# Patient Record
Sex: Female | Born: 1970
Health system: Southern US, Community
[De-identification: ages and names within clinical notes are randomized; demographics above are authoritative.]

## PROBLEM LIST (undated history)

## (undated) DIAGNOSIS — S32402A Unspecified fracture of left acetabulum, initial encounter for closed fracture: Secondary | ICD-10-CM

## (undated) DIAGNOSIS — E785 Hyperlipidemia, unspecified: Secondary | ICD-10-CM

## (undated) DIAGNOSIS — S39012A Strain of muscle, fascia and tendon of lower back, initial encounter: Secondary | ICD-10-CM

## (undated) DIAGNOSIS — D509 Iron deficiency anemia, unspecified: Secondary | ICD-10-CM

## (undated) DIAGNOSIS — D219 Benign neoplasm of connective and other soft tissue, unspecified: Secondary | ICD-10-CM

## (undated) DIAGNOSIS — E669 Obesity, unspecified: Secondary | ICD-10-CM

## (undated) DIAGNOSIS — I10 Essential (primary) hypertension: Secondary | ICD-10-CM

## (undated) DIAGNOSIS — N83209 Unspecified ovarian cyst, unspecified side: Secondary | ICD-10-CM

## (undated) HISTORY — PX: ORIF ACETABULAR FRACTURE: SHX5029

## (undated) HISTORY — DX: Hyperlipidemia, unspecified: E78.5

## (undated) HISTORY — DX: Benign neoplasm of connective and other soft tissue, unspecified: D21.9

## (undated) HISTORY — DX: Unspecified ovarian cyst, unspecified side: N83.209

## (undated) HISTORY — PX: FOOT ARTHRODESIS, MODIFIED MCBRIDE: SUR52

## (undated) HISTORY — DX: Essential (primary) hypertension: I10

## (undated) NOTE — *Deleted (*Deleted)
03/27/20 -  Sugar attack - didn't pass out   Siblings:  2000 sister died from colon cancer  Moved to [place] from [place] in 2010 Diabetes: 1800 sugar - Diagnosed 2010

---

## 1998-07-08 ENCOUNTER — Emergency Department (HOSPITAL_COMMUNITY): Admission: EM | Admit: 1998-07-08 | Discharge: 1998-07-08 | Payer: Self-pay | Admitting: Emergency Medicine

## 1998-07-09 ENCOUNTER — Inpatient Hospital Stay (HOSPITAL_COMMUNITY): Admission: EM | Admit: 1998-07-09 | Discharge: 1998-07-11 | Payer: Self-pay

## 1999-02-08 ENCOUNTER — Encounter: Admission: RE | Admit: 1999-02-08 | Discharge: 1999-02-08 | Payer: Self-pay | Admitting: Internal Medicine

## 1999-10-01 ENCOUNTER — Emergency Department (HOSPITAL_COMMUNITY): Admission: EM | Admit: 1999-10-01 | Discharge: 1999-10-01 | Payer: Self-pay | Admitting: *Deleted

## 2000-02-16 ENCOUNTER — Emergency Department (HOSPITAL_COMMUNITY): Admission: EM | Admit: 2000-02-16 | Discharge: 2000-02-16 | Payer: Self-pay | Admitting: Emergency Medicine

## 2002-06-18 ENCOUNTER — Encounter: Payer: Self-pay | Admitting: Emergency Medicine

## 2002-06-18 ENCOUNTER — Emergency Department (HOSPITAL_COMMUNITY): Admission: EM | Admit: 2002-06-18 | Discharge: 2002-06-18 | Payer: Self-pay | Admitting: Emergency Medicine

## 2002-06-21 ENCOUNTER — Encounter: Payer: Self-pay | Admitting: Emergency Medicine

## 2002-06-21 ENCOUNTER — Ambulatory Visit (HOSPITAL_COMMUNITY): Admission: RE | Admit: 2002-06-21 | Discharge: 2002-06-21 | Payer: Self-pay | Admitting: Emergency Medicine

## 2002-08-04 ENCOUNTER — Emergency Department (HOSPITAL_COMMUNITY): Admission: EM | Admit: 2002-08-04 | Discharge: 2002-08-04 | Payer: Self-pay | Admitting: Emergency Medicine

## 2002-10-27 ENCOUNTER — Emergency Department (HOSPITAL_COMMUNITY): Admission: EM | Admit: 2002-10-27 | Discharge: 2002-10-28 | Payer: Self-pay | Admitting: Emergency Medicine

## 2002-10-27 ENCOUNTER — Encounter: Payer: Self-pay | Admitting: Emergency Medicine

## 2002-12-03 ENCOUNTER — Encounter: Payer: Self-pay | Admitting: *Deleted

## 2002-12-03 ENCOUNTER — Ambulatory Visit (HOSPITAL_COMMUNITY): Admission: RE | Admit: 2002-12-03 | Discharge: 2002-12-03 | Payer: Self-pay | Admitting: *Deleted

## 2003-02-28 ENCOUNTER — Emergency Department (HOSPITAL_COMMUNITY): Admission: EM | Admit: 2003-02-28 | Discharge: 2003-02-28 | Payer: Self-pay | Admitting: Emergency Medicine

## 2003-03-28 ENCOUNTER — Emergency Department (HOSPITAL_COMMUNITY): Admission: EM | Admit: 2003-03-28 | Discharge: 2003-03-28 | Payer: Self-pay | Admitting: Emergency Medicine

## 2003-03-28 ENCOUNTER — Encounter: Payer: Self-pay | Admitting: Emergency Medicine

## 2003-06-10 ENCOUNTER — Emergency Department (HOSPITAL_COMMUNITY): Admission: EM | Admit: 2003-06-10 | Discharge: 2003-06-10 | Payer: Self-pay | Admitting: Emergency Medicine

## 2003-06-10 ENCOUNTER — Encounter: Payer: Self-pay | Admitting: Emergency Medicine

## 2003-09-18 ENCOUNTER — Emergency Department (HOSPITAL_COMMUNITY): Admission: EM | Admit: 2003-09-18 | Discharge: 2003-09-19 | Payer: Self-pay | Admitting: Emergency Medicine

## 2003-10-25 ENCOUNTER — Emergency Department (HOSPITAL_COMMUNITY): Admission: EM | Admit: 2003-10-25 | Discharge: 2003-10-25 | Payer: Self-pay | Admitting: Emergency Medicine

## 2003-10-30 ENCOUNTER — Emergency Department (HOSPITAL_COMMUNITY): Admission: EM | Admit: 2003-10-30 | Discharge: 2003-10-30 | Payer: Self-pay | Admitting: Emergency Medicine

## 2003-11-01 ENCOUNTER — Emergency Department (HOSPITAL_COMMUNITY): Admission: EM | Admit: 2003-11-01 | Discharge: 2003-11-01 | Payer: Self-pay | Admitting: Emergency Medicine

## 2003-11-09 ENCOUNTER — Encounter: Admission: RE | Admit: 2003-11-09 | Discharge: 2003-11-09 | Payer: Self-pay | Admitting: Internal Medicine

## 2003-11-21 ENCOUNTER — Encounter: Admission: RE | Admit: 2003-11-21 | Discharge: 2003-11-21 | Payer: Self-pay | Admitting: Internal Medicine

## 2003-11-28 ENCOUNTER — Encounter: Admission: RE | Admit: 2003-11-28 | Discharge: 2003-11-28 | Payer: Self-pay | Admitting: Internal Medicine

## 2004-01-23 ENCOUNTER — Encounter: Admission: RE | Admit: 2004-01-23 | Discharge: 2004-01-23 | Payer: Self-pay | Admitting: Internal Medicine

## 2004-01-24 ENCOUNTER — Emergency Department (HOSPITAL_COMMUNITY): Admission: EM | Admit: 2004-01-24 | Discharge: 2004-01-24 | Payer: Self-pay | Admitting: Emergency Medicine

## 2004-01-25 ENCOUNTER — Encounter: Admission: RE | Admit: 2004-01-25 | Discharge: 2004-01-25 | Payer: Self-pay | Admitting: Internal Medicine

## 2004-01-25 ENCOUNTER — Ambulatory Visit (HOSPITAL_COMMUNITY): Admission: RE | Admit: 2004-01-25 | Discharge: 2004-01-25 | Payer: Self-pay | Admitting: Internal Medicine

## 2004-01-26 ENCOUNTER — Emergency Department (HOSPITAL_COMMUNITY): Admission: EM | Admit: 2004-01-26 | Discharge: 2004-01-26 | Payer: Self-pay | Admitting: Emergency Medicine

## 2004-01-27 ENCOUNTER — Inpatient Hospital Stay (HOSPITAL_COMMUNITY): Admission: EM | Admit: 2004-01-27 | Discharge: 2004-01-30 | Payer: Self-pay | Admitting: Emergency Medicine

## 2004-01-30 ENCOUNTER — Inpatient Hospital Stay (HOSPITAL_COMMUNITY): Admission: RE | Admit: 2004-01-30 | Discharge: 2004-02-03 | Payer: Self-pay | Admitting: Psychiatry

## 2004-02-07 ENCOUNTER — Emergency Department (HOSPITAL_COMMUNITY): Admission: EM | Admit: 2004-02-07 | Discharge: 2004-02-07 | Payer: Self-pay | Admitting: Family Medicine

## 2004-06-10 ENCOUNTER — Encounter: Admission: RE | Admit: 2004-06-10 | Discharge: 2004-06-10 | Payer: Self-pay | Admitting: Internal Medicine

## 2004-07-31 ENCOUNTER — Emergency Department (HOSPITAL_COMMUNITY): Admission: EM | Admit: 2004-07-31 | Discharge: 2004-07-31 | Payer: Self-pay | Admitting: Emergency Medicine

## 2005-03-03 ENCOUNTER — Ambulatory Visit: Payer: Self-pay | Admitting: Internal Medicine

## 2005-05-21 ENCOUNTER — Emergency Department (HOSPITAL_COMMUNITY): Admission: EM | Admit: 2005-05-21 | Discharge: 2005-05-21 | Payer: Self-pay | Admitting: Family Medicine

## 2005-08-26 ENCOUNTER — Emergency Department (HOSPITAL_COMMUNITY): Admission: EM | Admit: 2005-08-26 | Discharge: 2005-08-26 | Payer: Self-pay | Admitting: Family Medicine

## 2005-10-01 ENCOUNTER — Ambulatory Visit: Payer: Self-pay | Admitting: Internal Medicine

## 2005-10-06 ENCOUNTER — Ambulatory Visit: Payer: Self-pay | Admitting: Internal Medicine

## 2006-01-17 ENCOUNTER — Emergency Department (HOSPITAL_COMMUNITY): Admission: EM | Admit: 2006-01-17 | Discharge: 2006-01-17 | Payer: Self-pay | Admitting: Family Medicine

## 2006-01-21 ENCOUNTER — Ambulatory Visit: Payer: Self-pay | Admitting: Internal Medicine

## 2006-01-21 ENCOUNTER — Ambulatory Visit (HOSPITAL_COMMUNITY): Admission: RE | Admit: 2006-01-21 | Discharge: 2006-01-21 | Payer: Self-pay | Admitting: Internal Medicine

## 2006-02-09 ENCOUNTER — Ambulatory Visit: Payer: Self-pay | Admitting: Hospitalist

## 2006-07-22 ENCOUNTER — Emergency Department (HOSPITAL_COMMUNITY): Admission: EM | Admit: 2006-07-22 | Discharge: 2006-07-22 | Payer: Self-pay | Admitting: Family Medicine

## 2006-12-08 ENCOUNTER — Emergency Department (HOSPITAL_COMMUNITY): Admission: EM | Admit: 2006-12-08 | Discharge: 2006-12-08 | Payer: Self-pay | Admitting: Family Medicine

## 2007-09-15 ENCOUNTER — Emergency Department (HOSPITAL_COMMUNITY): Admission: EM | Admit: 2007-09-15 | Discharge: 2007-09-15 | Payer: Self-pay | Admitting: Family Medicine

## 2008-06-30 ENCOUNTER — Emergency Department (HOSPITAL_COMMUNITY): Admission: EM | Admit: 2008-06-30 | Discharge: 2008-06-30 | Payer: Self-pay | Admitting: Emergency Medicine

## 2010-05-15 ENCOUNTER — Emergency Department (HOSPITAL_COMMUNITY): Admission: EM | Admit: 2010-05-15 | Discharge: 2010-05-15 | Payer: Self-pay | Admitting: Emergency Medicine

## 2010-05-30 ENCOUNTER — Ambulatory Visit: Payer: Self-pay | Admitting: Internal Medicine

## 2010-05-30 DIAGNOSIS — E1110 Type 2 diabetes mellitus with ketoacidosis without coma: Secondary | ICD-10-CM

## 2010-05-30 DIAGNOSIS — F32A Depression, unspecified: Secondary | ICD-10-CM | POA: Insufficient documentation

## 2010-05-30 DIAGNOSIS — M5116 Intervertebral disc disorders with radiculopathy, lumbar region: Secondary | ICD-10-CM | POA: Insufficient documentation

## 2010-05-30 DIAGNOSIS — E1165 Type 2 diabetes mellitus with hyperglycemia: Secondary | ICD-10-CM

## 2010-05-30 DIAGNOSIS — E785 Hyperlipidemia, unspecified: Secondary | ICD-10-CM | POA: Insufficient documentation

## 2010-05-30 DIAGNOSIS — E119 Type 2 diabetes mellitus without complications: Secondary | ICD-10-CM | POA: Insufficient documentation

## 2010-05-30 DIAGNOSIS — F329 Major depressive disorder, single episode, unspecified: Secondary | ICD-10-CM

## 2010-05-30 DIAGNOSIS — Q6600 Congenital talipes equinovarus, unspecified foot: Secondary | ICD-10-CM | POA: Insufficient documentation

## 2010-05-30 DIAGNOSIS — IMO0002 Reserved for concepts with insufficient information to code with codable children: Secondary | ICD-10-CM | POA: Insufficient documentation

## 2010-05-30 LAB — CONVERTED CEMR LAB: Blood Glucose, Home Monitor: 3 mg/dL

## 2010-06-07 ENCOUNTER — Telehealth (INDEPENDENT_AMBULATORY_CARE_PROVIDER_SITE_OTHER): Payer: Self-pay | Admitting: *Deleted

## 2010-06-26 ENCOUNTER — Encounter: Payer: Self-pay | Admitting: Internal Medicine

## 2010-06-26 ENCOUNTER — Ambulatory Visit: Payer: Self-pay | Admitting: Internal Medicine

## 2010-06-26 LAB — CONVERTED CEMR LAB
Blood Glucose, Fingerstick: 115
Hgb A1c MFr Bld: 8.4 %

## 2010-06-27 ENCOUNTER — Telehealth: Payer: Self-pay | Admitting: Internal Medicine

## 2010-08-05 ENCOUNTER — Telehealth (INDEPENDENT_AMBULATORY_CARE_PROVIDER_SITE_OTHER): Payer: Self-pay | Admitting: *Deleted

## 2010-08-16 ENCOUNTER — Telehealth (INDEPENDENT_AMBULATORY_CARE_PROVIDER_SITE_OTHER): Payer: Self-pay | Admitting: *Deleted

## 2010-09-17 ENCOUNTER — Telehealth: Payer: Self-pay | Admitting: *Deleted

## 2010-09-19 ENCOUNTER — Ambulatory Visit: Payer: Self-pay | Admitting: Internal Medicine

## 2010-09-19 ENCOUNTER — Encounter: Payer: Self-pay | Admitting: Internal Medicine

## 2010-09-19 DIAGNOSIS — E162 Hypoglycemia, unspecified: Secondary | ICD-10-CM | POA: Insufficient documentation

## 2010-09-19 LAB — CONVERTED CEMR LAB
Blood Glucose, Fingerstick: 46
Creatinine, Urine: 221.3 mg/dL
Hgb A1c MFr Bld: 6.1 %

## 2010-10-02 ENCOUNTER — Ambulatory Visit: Payer: Self-pay | Admitting: Sports Medicine

## 2010-10-02 DIAGNOSIS — M2140 Flat foot [pes planus] (acquired), unspecified foot: Secondary | ICD-10-CM | POA: Insufficient documentation

## 2010-10-02 DIAGNOSIS — L84 Corns and callosities: Secondary | ICD-10-CM | POA: Insufficient documentation

## 2010-10-02 DIAGNOSIS — M2170 Unequal limb length (acquired), unspecified site: Secondary | ICD-10-CM | POA: Insufficient documentation

## 2010-10-10 ENCOUNTER — Inpatient Hospital Stay (HOSPITAL_COMMUNITY): Admission: EM | Admit: 2010-10-10 | Discharge: 2010-05-18 | Payer: Self-pay | Admitting: Emergency Medicine

## 2010-10-16 ENCOUNTER — Ambulatory Visit: Payer: Self-pay | Admitting: Sports Medicine

## 2010-10-27 ENCOUNTER — Emergency Department (HOSPITAL_COMMUNITY)
Admission: EM | Admit: 2010-10-27 | Discharge: 2010-10-27 | Payer: Self-pay | Source: Home / Self Care | Admitting: Emergency Medicine

## 2010-12-05 NOTE — Assessment & Plan Note (Signed)
Summary: DM TEACHING/MD APPT/DS   Allergies: 1)  ! Codeine   Complete Medication List: 1)  Glipizide 10 Mg Tabs (Glipizide) .... Take 1 tablet by mouth once a day 2)  Novolin N 100 Unit/ml Susp (Insulin isophane human) .... Take 20 units subcutaneously twice a day with meals. 3)  Metformin Hcl 500 Mg Tabs (Metformin hcl) .... Take 1 tablet by mouth two times a day 4)  Acetaminophen 325 Mg Tabs (Acetaminophen) .... 2 tabs every 6 hour as needed for pain 5)  Multi-vitamin/minerals Tabs (Multiple vitamins-minerals) .... Take 1 tablet by mouth once a day 6)  Truetrack Test Strp (Glucose blood) .... Use to test blood sugar 3x daily 7)  Lancets 30g Misc (Lancets) .... Use to test blood sugar 3x daily 8)  Insulin Syringe 31g X 5/16" 0.5 Ml Misc (Insulin syringe-needle u-100) .... Use to inject insulin twice daily  Other Orders: DSMT(Medicare) Individual, 30 Minutes (E4540)  Diabetes Self Management Training  PCP: Lyn Hollingshead Date diagnosed with diabetes: 05/12/2010 Diabetes Type: Type 2 insulin Other persons present: woman with her today ? daughter vs friend  Diabetes Medications:  Comments: True track provided today as patient thought it likely that she would get [facility] certified. Patient deonstrated good technique by testing her friend's blood sugar. patient's mother wit diabetes nad is a nurse sopatient knowledgeable about her medications names and doses.    Monitoring Self monitoring blood glucose 3 times a day Name of Meter  True Track  Recent Episodes of: Requiring Help from another person  Hyperglycemia : No     Diabetes Disease Process  Discussed today Define diabetes in simple terms: Needs review/assistanceState own type of diabetes: Needs review/assistanceState diabetes is treated by meal plan-exercise-medication-monitoring-education: Needs review/assistance Medications State name-action-dose-duration-side effects-and time to take medication: Needs  review/assistanceState appropriate timing of food related to medication: Demonstrates competencyState insulin adjustment guidelines: No knowledgeDescribe safe needle/lancet disposal: No knowledge Nutritional Management Identify what foods most often affect blood glucose: Demonstrates competencyVerbalize importance of controlling food portions: Demonstrates competencyState importance of spacing and not omitting meals and snacks: Needs review/assistanceState changes planned for home meals/snacks: Needs review/assistance Monitoring State purpose and frequency of monitoring BG-ketones-HgbA1C  : Needs review/assistancePerform glucose monitoring/ketone testing and record results correctly: Demonstrates competencyState target blood glucose and HgbA1C goals: Needs review/assistance Complications State the causes-signs and symptoms and prevention of Hyperglycemia: Needs review/assistanceExplain proper treatment of hyperglycemia: Needs review/assistanceState the causes- signs and symptoms and prevention of hypoglycemia: Needs review/assistanceExplain proper treatment of hypoglycemia: Demonstrates competency Exercise  Lifestyle changes:Goal setting and Problem solving State benefits of making appropriate lifestyle changes: Demonstrates competencyIdentify lifestyle behaviors that need to change: Needs review/assistanceIdentify risk factors that interfere with health: Needs review/assistanceDevelop strategies to reduce risk factors: Needs review/assistanceVerbalize need for and frequency of health care follow-up: Needs review/assistanceIdentify Family/SO role in managing diabetes: Demonstrates competencyDiabetes Management Education Done: 05/30/2010    BEHAVIORAL GOALS INITIAL Monitoring blood glucose levels daily: record blood sugars in book        Diabetes Self Management Support: friend, mother clinic staff Follow-up 2-3 weeks:

## 2010-12-05 NOTE — Letter (Signed)
Summary: BLOOD GLUCOSE [place] CLICK  BLOOD GLUCOSE /ZERO CLICK   Imported By: Margie Billet 09/30/2010 13:57:40  _____________________________________________________________________  External Attachment:    Type:   Image     Comment:   External Document

## 2010-12-05 NOTE — Progress Notes (Signed)
Summary: refill/gg  Phone Note Refill Request  on June 07, 2010 3:03 PM  Refills Requested: Medication #1:  INSULIN SYRINGE 31G X 5/16" 0.5 ML MISC use to inject insulin twice daily.  Medication #2:  TRUETRACK TEST  STRP use to test blood sugar 3x daily  Method Requested: Fax to Local Pharmacy Initial call taken by: Merrie Roof RN,  June 07, 2010 3:03 PM    Prescriptions: INSULIN SYRINGE 31G X 5/16" 0.5 ML MISC (INSULIN SYRINGE-NEEDLE U-100) use to inject insulin twice daily  #100 x 11   Entered and Authorized by:   Zoila Shutter MD   Signed by:   Zoila Shutter MD on 06/07/2010   Method used:   Faxed to ...       [facility] (retail)       [address]       [place], [place]  [address]       Ph: [phone]       Fax: [phone]   RxID:   [id number] TRUETRACK TEST  STRP (GLUCOSE BLOOD) use to test blood sugar 3x daily Brand medically necessary #100 x 11   Entered and Authorized by:   Zoila Shutter MD   Signed by:   Zoila Shutter MD on 06/07/2010   Method used:   Faxed to ...       [facility] (retail)       [address]       [place], [place]  [address]       Ph: [phone]       Fax: [phone]   RxID:   [id number]

## 2010-12-05 NOTE — Progress Notes (Signed)
Summary: follow up Diabetes Self Managment Training/dmr  Phone Note Outgoing Call   Call placed by: Jamison Neighbor RD,CDE,  August 16, 2010 11:34 AM Summary of Call: called to follow up on DSMT-  a little bit better, is owkring on diet and exercise,still working on quitting smoking,does not deisre Social work/smoking cessation counseling. Did agree to [facility] na appointment for DSMT next week.

## 2010-12-05 NOTE — Assessment & Plan Note (Signed)
Summary: L FOOT CALLUSES X 1 YEAR   Vital Signs:  Patient profile:   40 year old female BP sitting:   126 / 86  Vitals Entered By: Lillia Pauls CMA (October 02, 2010 2:48 PM)  Primary Provider:  Lyn Hollingshead   History of Present Illness: 40 yo F DM 2 dxed in 05/2010 referred by IM clinic for persistent foot callus on bottom of Lt foot.  She walks alot.  Has Fila bball shoes that she wears occasionally, also has [employer] wakling shoes.  Always high tops that she wears. 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  Also had some sort of screw/pin fixation in her Rt ankle.  Thus, does not have much ROM in Rt ankle. 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  Now her Lt leg is shorter. A1c 4 months ago was 12, now down to 6 and off insulin. Today presents for orthotic eval.  She has persistent callus at 2nd MT head on Lt foot.  Still has good sensation in her feet.  Has not noticed pus or redness on bottom of her foot. IM clinic referred her to protect feet and prevent worsening callus.  Allergies: 1)  ! Codeine  Past History:  Family History: Last updated: 06/26/2010 Family History Diabetes 1st degree relative: Father, mother and brother.  Social History: Last updated: 06/26/2010 Homosexual; in a monogmaous relationship with a homosexual woman. 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  The patient denies fever and weight loss.    Physical Exam  General:  overweight-appearing.   Head:  normocephalic and atraumatic.   Nose:  no external deformity.   Neck:  supple.    Lungs:  normal respiratory effort.   Msk:  leg lengths: Rt leg 41.5 inches, Lt leg 40.5 inches  Rt foot: prior surg scar along 1st MTP.  Also prior scar at med mall/navicular region.  Very severe pes planus.  Minimal ankle and foot ROM.  Severe bunion s.  Entire foot is chronically everted.  1st toe subluxing under 2nd toe which is long like a Morton's.  No calluses or ulcers.  SILT.  Clearly with post tib dysfunction.  + transv arch breakdown.  sig weathering/scaling of heel.  Lt foot: bunioning of all toes  + ttp callus (with mild ulceration but minimal and no pus) under 2nd MT head.  actually has maintained cavus/varus foot on this side, likely as compensation for shorter leg length.  obvious transv arch breakdown.  sig weathering/scaling of heel.  Knees: b/l genu valgum  Gait: mild trendelenberg to Lt side Neurologic:  alert & oriented X3.     Impression & Recommendations:  Problem # 1:  CALLUS, LEFT FOOT (ICD-700)  this is primary reason she is referred to [place] - given sports insoles with MT cookie/tongue pad on plantar aspect of Lt insole just proximal to 2nd MT head.  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 Take 1/2 tablet by mouth once a day 2)  Metformin Hcl  1000 Mg Tabs (Metformin hcl) .... Take onetabletby mouth two times a day with meals. 3)  Acetaminophen 325 Mg Tabs (Acetaminophen) .... 2 tabs every 6 hour as needed for pain 4)  Multi-vitamin/minerals Tabs (Multiple vitamins-minerals) .... Take 1 tablet by mouth once a day 5)  Lancets 30g Misc (Lancets) .... Use to test blood sugar 3x daily 6)  Insulin Syringe 31g X 5/16" 0.5 Ml Misc (Insulin syringe-needle u-100) .... Use to inject insulin twice daily 7)  Cyclobenzaprine Hcl 10 Mg Tabs (Cyclobenzaprine hcl) .... Take one tablet q12 hours as needed 8)  [employer] Test Strp (Glucose blood) .... Use to test blood sugar 3x daily  Patient Instructions: 1)  follow up in 1-2 weeks for your orthotics 2)  Try UREA cream for the heels.   Orders Added: 1)  Est. Patient Level III [52841] 2)  Foot Orthosis ( Arch Strap/Heel Cup) [L2440] 3)  Sports Insoles [L3510]

## 2010-12-05 NOTE — Assessment & Plan Note (Signed)
Summary: FU FEET APPT PER DR. Saumya Hukill/MJD   Vital Signs:  Patient profile:   40 year old female Height:      74.75 inches Weight:      304 pounds BMI:     38.39 Pulse rate:   89 / minute BP sitting:   129 / 88  (right arm)  Vitals Entered By: Lillia Pauls CMA (October 16, 2010 2:46 PM) CC: orthotics   Primary Provider:  Lyn Hollingshead  CC:  orthotics.  History of Present Illness: 40 yo F here for diabetic custom orthotics.  We gave her green insoles with heel lift and tongue pad for Lt foot last visit.  They are comfortable for her and she enjoys these. See prev note for full details. Does have 2.5 cm shorter Lt leg.  Preventive Screening-Counseling & Management  Alcohol-Tobacco     Smoking Status: current     Packs/Day: 1-2 cigarettes per day  Allergies: 1)  ! Codeine  Social History: Smoking Status:  current Packs/Day:  1-2 cigarettes per day  Physical Exam  General:  obese Msk:  Rt foot: chronically everted, severe great toe bunion.  Obvioust post tib dysfunction, severe pes planus.  Lt foot:  Persistent plantar 2nd MT head callus that is ttp.  Obvious transv arch breakdown.  Still maintains a long arch.   Impression & Recommendations:  Problem # 1:  CALLUS, LEFT FOOT (ICD-700)  protect with custom orthotics today  Orders: Foot Orthosis ( Arch Strap/Heel Cup) [phone])  Problem # 2:  PES PLANUS, RIGHT (ICD-734) likely from severe post tib dysfunction given her h/o ankle surgery  Problem # 3:  UNEQUAL LEG LENGTH (ICD-736.81)  corrected with orthotics today  Patient was fitted for a : standard, cushioned, semi-rigid orthotic. The orthotic was heated and afterward the patient stood on the orthotic blank positioned on the orthotic stand. The patient was positioned in subtalar neutral position and 10 degrees of ankle dorsiflexion in a weight bearing stance. After completion of molding, a stable base was applied to the orthotic blank. The blank was ground to a  stable position for weight bearing. Size: 13 (diabetic) Base: EVA x 1 on Rt, and EVA x 2 on Lt Posting: none Additional orthotic padding: added tongue pad to dorsal aspect of orthotic just inferior to her callus  tried prior to leaving, very comfortable for her and helped to correct her trendeleberg gait.  F/u prn  Orders: Orthotic Materials, each unit ([id number])  Complete Medication List: 1)  Glipizide 10 Mg Tabs (Glipizide) .... Take 1/2 tablet by mouth once a day 2)  Metformin Hcl 1000 Mg Tabs (Metformin hcl) .... Take onetabletby mouth two times a day with meals. 3)  Acetaminophen 325 Mg Tabs (Acetaminophen) .... 2 tabs every 6 hour as needed for pain 4)  Multi-vitamin/minerals Tabs (Multiple vitamins-minerals) .... Take 1 tablet by mouth once a day 5)  Lancets 30g Misc (Lancets) .... Use to test blood sugar 3x daily 6)  Insulin Syringe 31g X 5/16" 0.5 Ml Misc (Insulin syringe-needle u-100) .... Use to inject insulin twice daily 7)  Cyclobenzaprine Hcl 10 Mg Tabs (Cyclobenzaprine hcl) .... Take one tablet q12 hours as needed 8)  [employer] Test Strp (Glucose blood) .... Use to test blood sugar 3x daily   Orders Added: 1)  Est. Patient Level III [60454] 2)  Orthotic Materials, each unit [L3002] 3)  Foot Orthosis ( Arch Strap/Heel Cup) [U9811]

## 2010-12-05 NOTE — Assessment & Plan Note (Signed)
Summary: RA/-DM AND HFU/DS   Vital Signs:  Patient profile:   40 year old female Height:      74.75 inches (189.87 cm) Weight:      296.1 pounds (134.59 kg) BMI:     37.39 Temp:     96.8 degrees F (36 degrees C) oral Pulse rate:   80 / minute BP sitting:   124 / 74  (right arm)  Vitals Entered By: Stanton Kidney Ditzler RN (May 30, 2010 9:03 AM) Is Patient Diabetic? Yes Did you bring your meter with you today? No Pain Assessment Patient in pain? no      Nutritional Status BMI of > 30 = obese Nutritional Status Detail appetite good CBG Result 110  Have you ever been in a relationship where you felt threatened, hurt or afraid?denies   Does patient need assistance? Functional Status Self care Ambulation Normal Comments Friend with pt. Old-new pt to clinic. HFU  - dx diabetes - no teaching . Appt made today to see Jamison Neighbor. No money for meds - needs to see [employer].   History of Present Illness: Pt is a 40 y/o woman with PMH/problems as outlined in EMR.  Pt comes to the clinic today for a f/u visit for her recent hospital admission for DKA.  She was admitted in hospital for DKA on 05/15/2010, with CBG 442. and she was ketone +ve with low bicarb. She also had abd pain, dry mouth and frequent urination at that time. She was treated and was stabilized and then D/c'd on 05/17/10, when her CBG was 293. her HbA1c on 05/15/10 was 12.4.  She has a new Dx of DM 2.   She came here today with no complaints. She is feeling well. Needs to get educates about using insulin and measuring her CBG, for which she met Jamison Neighbor today.   Denies any fever,cough,chest pain,SOB, abd pain, N/V,diarrhea, urinary abn, anorexia, wt loss, headache.    Depression History:      The patient denies a depressed mood most of the day and a diminished interest in her usual daily activities.         Preventive Screening-Counseling & Management  Alcohol-Tobacco     Smoking Status: current     Packs/Day:  1-2 cigs per day  Current Medications (verified): 1)  Glipizide 10 Mg Tabs (Glipizide) .... Take 1 Tablet By Mouth Once A Day 2)  Novolin N 100 Unit/ml Susp (Insulin Isophane Human) .... Take 20 Units Subcutaneously Twice A Day With Meals. 3)  Metformin Hcl 500 Mg Tabs (Metformin Hcl) .... Take 1 Tablet By Mouth Two Times A Day 4)  Acetaminophen 325 Mg Tabs (Acetaminophen) .... 2 Tabs Every 6 Hour As Needed For Pain 5)  Multi-Vitamin/minerals  Tabs (Multiple Vitamins-Minerals) .... Take 1 Tablet By Mouth Once A Day  Allergies (verified): 1)  ! Codeine  Family History: Family History Diabetes 1st degree relative: Father, nother and brother.  Social History: Smoking Status:  current Packs/Day:  1-2 cigs per day  Review of Systems       as per HPI.Tina [name]  Physical Exam  General:  alert, well-developed, and well-nourished.   Head:  normocephalic and atraumatic.   Eyes:  vision grossly intact and no injection.   Ears:  R ear normal, L ear normal, and no external deformities.   Nose:  no external deformity, no external erythema, and no nasal discharge.   Mouth:  pharynx pink and moist.   Neck:  supple and full  ROM.   Lungs:  normal respiratory effort, no intercostal retractions, no accessory muscle use, normal breath sounds, no crackles, and no wheezes.   Heart:  normal rate, regular rhythm, no murmur, no gallop, and no rub.   Abdomen:  soft, non-tender, and normal bowel sounds.   Msk:  normal ROM, no joint tenderness, and no joint swelling.   Pulses:  R radial normal.   Neurologic:  alert & oriented X3.     Impression & Recommendations:  Problem # 1:  DKA (ICD-250.10) Assessment New Pt came for f/u after her recent DKA with new Dx of DM2.  She feels well today and has no complaints as per DKA. Her updated medication list for this problem includes:    Glipizide 10 Mg Tabs (Glipizide) .Tina [name]... Take 1 tablet by mouth once a day    Novolin N 100 Unit/ml Susp (Insulin isophane human)  .Tina [name]... Take 20 units subcutaneously twice a day with meals.    Metformin Hcl 500 Mg Tabs (Metformin hcl) .Tina [name]... Take 1 tablet by mouth two times a day  Problem # 2:  DIABETES MELLITUS, TYPE II (ICD-250.00) Assessment: New She was send home from hospital on the following meds:\ 1) Glipizide- 10mg  Take 1 tablet by mouth once a day with meal. 2) Insulin Aspart ( Novolog) - 3 Units subcutaneously three times a day with meals. 3) Novolin- 20 Units subcutaneously two times a day with meals. 4) Metformin 500mg  Take 1 tablet by mouth two times a day with meals.  Her CBG today was 110. She seems to be on too many meds for her recently Dxed DM 2.  After discussing with Jamison Neighbor and  Dr. Meredith Pel, decided to remove NOVOLOG from her list and continue her on 3 meds, and f/u in 2 months and recheck her HbA1c and make appropriate changes as needed then.  Her updated medication list for this problem includes:    Glipizide 10 Mg Tabs (Glipizide) .Tina [name]... Take 1 tablet by mouth once a day    Novolin N 100 Unit/ml Susp (Insulin isophane human) .Tina [name]... Take 20 units subcutaneously twice a day with meals.    Metformin Hcl 500 Mg Tabs (Metformin hcl) .Tina [name]... Take 1 tablet by mouth two times a day  Orders: Capillary Blood Glucose/CBG ([address])  Problem # 3:  HYPERLIPIDEMIA (ICD-272.4) Since 2007. On 05/18/10- total chol- 160, LDL-84, HDL-30.  Complete Medication List: 1)  Glipizide 10 Mg Tabs (Glipizide) .... Take 1 tablet by mouth once a day 2)  Novolin N 100 Unit/ml Susp (Insulin isophane human) .... Take 20 units subcutaneously twice a day with meals. 3)  Metformin Hcl 500 Mg Tabs (Metformin hcl) .... Take 1 tablet by mouth two times a day 4)  Acetaminophen 325 Mg Tabs (Acetaminophen) .... 2 tabs every 6 hour as needed for pain 5)  Multi-vitamin/minerals Tabs (Multiple vitamins-minerals) .... Take 1 tablet by mouth once a day  Patient Instructions: 1)  Please schedule a follow-up appointment in 2 months. 2)  For  your diabetes treatment. 3)  1)Take the NPH insulin 2 times a day with meals 20 units  4)  2) take metformin 500mg  2 times a day 5)  2) take glipizide 10mg  once a day. 6)  Make an early appointment if condition worsens. Prescriptions: METFORMIN HCL 500 MG TABS (METFORMIN HCL) Take 1 tablet by mouth two times a day  #60 x 3   Entered and Authorized by:   Lyn Hollingshead   Signed by:   Lyn Hollingshead  on 05/30/2010   Method used:   Print then Give to Patient   RxID:   [id number] NOVOLIN N 100 UNIT/ML SUSP (INSULIN ISOPHANE HUMAN) take 20 units subcutaneously twice a day with meals.  #20 ml x 1   Entered and Authorized by:   Lyn Hollingshead   Signed by:   Lyn Hollingshead on 05/30/2010   Method used:   Print then Give to Patient   RxID:   [id number] GLIPIZIDE 10 MG TABS (GLIPIZIDE) Take 1 tablet by mouth once a day  #30 x 5   Entered and Authorized by:   Lyn Hollingshead   Signed by:   Lyn Hollingshead on 05/30/2010   Method used:   Print then Give to Patient   RxID:   [id number]
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
## 2010-12-05 NOTE — Assessment & Plan Note (Signed)
Summary: DM check/letter/gg   Vital Signs:  Patient profile:   40 year old female Height:      74.75 inches Weight:      304.0 pounds BMI:     38.39 Temp:     97.2 degrees F oral Pulse rate:   80 / minute BP sitting:   130 / 70  (right arm)  Vitals Entered By: Filomena Jungling NT II (September 19, 2010 1:26 PM) CC:  followup visit Is Patient Diabetic? Yes Did you bring your meter with you today? Yes Research Study Name: cbg 46 Pt was given 8 glucose tabs (32grams total).[facility]yn[facility])  September 19, 2010 2:00 PM  Nutritional Status BMI of > 30 = obese CBG Result 46  Have you ever been in a relationship where you felt threatened, hurt or afraid?No   Does patient need assistance? Functional Status Self care Ambulation Normal   Diabetic Foot Exam Foot Inspection Is there a history of a foot ulcer?              No Is there a foot ulcer now?              No Can the patient see the bottom of their feet?          Yes Are the shoes appropriate in style and fit?          Yes Is there swelling or an abnormal foot shape?          No Are the toenails long?                No Are the toenails thick?                No Are the toenails ingrown?              No Is there heavy callous build-up?              No Is there pain in the calf muscle (Intermittent claudication) when walking?    NoIs there a claw toe deformity?              No Is there elevated skin temperature?            No Is there limited ankle dorsiflexion?            No Is there foot or ankle muscle weakness?            No  Diabetic Foot Care Education Pulse Check          Right Foot          Left Foot Posterior Tibial:        normal            normal Dorsalis Pedis:        normal            normal  High Risk Feet? No   Primary Care Provider:  Lyn Hollingshead  CC:   followup visit.  History of Present Illness: Pt is a 40 yo newly Dx DM in 05/2010 and HDL who came here for DM f/u. She has been using her meds as  instructed, and took novolog 1-2 per wk, last time use 3-4 days ago. She always takes her meds with meals. Her CBg runs 60s-190, most time 120. She sometime has sweating and palpitation. She has no other c/o, including CP, SOB or diarrhea. 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  Depression History:      The patient denies a depressed mood most of the day and a diminished interest in her usual daily activities.         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 Take 1 Tablet By Mouth Once A Day 2)  Novolin N 100 Unit/ml Susp (Insulin Isophane Human) .... Use As Instructed Per Sliding Scale Protocol 3)  Metformin Hcl 1000 Mg Tabs (Metformin Hcl) .... Take Onetabletby Mouth Two Times A Day With Meals. 4)  Acetaminophen 325 Mg Tabs (Acetaminophen) .... 2 Tabs Every 6 Hour As Needed For Pain 5)  Multi-Vitamin/minerals  Tabs (Multiple Vitamins-Minerals) .... Take 1 Tablet By Mouth Once A Day 6)  Lancets 30g  Misc (Lancets) .... Use To Test Blood Sugar 3x Daily 7)  Insulin Syringe 31g X 5/16" 0.5 Ml Misc (Insulin Syringe-Needle U-100) .... Use To Inject Insulin Twice Daily 8)  Cyclobenzaprine Hcl 10 Mg Tabs (Cyclobenzaprine Hcl) .... Take One Tablet Q12 Hours As Needed 9)  Wavesense Presto Test  Strp (Glucose Blood) .... Use To Test Blood Sugar 3x  Daily  Current Medications (verified): 1)  Glipizide 10 Mg Tabs (Glipizide) .... Take 1 Tablet By Mouth Once A Day 2)  Novolin N 100 Unit/ml Susp (Insulin Isophane Human) .... Use As Instructed Per Sliding Scale Protocol 3)  Metformin Hcl 1000 Mg Tabs (Metformin Hcl) .... Take Onetabletby Mouth Two Times A Day With Meals. 4)  Acetaminophen 325 Mg Tabs (Acetaminophen) .... 2 Tabs Every 6 Hour As Needed For Pain 5)  Multi-Vitamin/minerals  Tabs (Multiple Vitamins-Minerals) .... Take 1 Tablet By Mouth Once A Day 6)  Lancets 30g  Misc (Lancets) .... Use To Test Blood Sugar 3x Daily 7)  Insulin Syringe 31g X 5/16" 0.5 Ml Misc (Insulin Syringe-Needle U-100) .... Use To Inject Insulin Twice Daily 8)  Cyclobenzaprine Hcl 10 Mg Tabs (Cyclobenzaprine Hcl) .... Take One Tablet Q12 Hours As Needed 9)  Wavesense Presto Test  Strp (Glucose Blood) .... Use To Test Blood Sugar 3x Daily  Allergies (verified): 1)  ! Codeine  Past History:  Family History: Last updated: 06/26/2010 Family History Diabetes 1st degree relative: Father, mother and brother.  Social History: Last updated: 06/26/2010 Homosexual; in a monogmaous relationship with a homosexual woman. No smoking, no drugs, no alcohol  Risk Factors: Smoking Status: never (09/19/2010) Packs/Day: 1-2 cigs per day (09/19/2010)  Social History: Reviewed history from 06/26/2010 and no changes required. Homosexual; in a monogmaous relationship with a homosexual woman. No smoking, no drugs, no alcohol  Review of Systems       The patient complains of weight gain.  The patient denies chest pain, syncope, dyspnea on exertion, peripheral edema, prolonged cough, headaches, abdominal pain, melena, hematochezia, and vision loss.    Physical Exam  General:  alert, well-developed, well-nourished, well-hydrated, and overweight-appearing.  Nose:  no nasal discharge.   Mouth:  pharynx pink and moist.   Neck:  supple.   Lungs:  normal respiratory  effort, normal breath sounds, no crackles, and no wheezes.   Heart:  normal rate, regular rhythm, no murmur, and no JVD.   Abdomen:  soft, non-tender, normal bowel sounds, and no distention.   Msk:  normal ROM, no joint tenderness, no joint swelling, and no joint warmth.   Pulses:  2+ Extremities:  No edema.  Neurologic:  alert & oriented X3, cranial nerves II-XII intact, strength normal in all extremities, sensation intact to light touch, and gait normal.     Impression & Recommendations:  Problem # 1:  DIABETES MELLITUS, TYPE II (ICD-250.00) Assessment Improved  Her DM welll controlled and since her hypoglycemic episode w/o novolog use, so likely due to glipizide. Will decrease her dose for now and d/c novolog as she rarely uses novolog. Will recheck her CBG in 2 weeks. She already has DM education. Aslo advised weight loss and exercise. Will order urine Alb/Cr ratio.  The following medications were removed from the medication list:    Novolin N 100 Unit/ml Susp (Insulin isophane human) ..... Use as instructed per sliding scale protocol Her updated medication list for this problem includes:    Glipizide 10 Mg Tabs (Glipizide) .Marland Kitchen... Take 1/2 tablet by mouth once a day    Metformin Hcl 1000 Mg Tabs (Metformin hcl) .Marland Kitchen... Take onetabletby mouth two times a day with meals.  Orders: T- Capillary Blood Glucose ([id number]) T-Hgb A1C (in-house) ([id number]) Podiatry Referral (Podiatry)  Reviewed HgBA1c results: 6.1 (09/19/2010)  8.4 (06/26/2010)  Problem # 2:  HYPERLIPIDEMIA (ICD-272.4) Assessment: Unchanged Her LDL 84 in 05/2010 and no needs statin for now.  Problem # 3:  HYPOGLYCEMIA (ICD-251.2) Assessment: Comment Only She has episodes of hypoglycemia, likely due to glipizide. Will decrease the dose to 5 mg once daily and recheck her CBG at next visit. 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 She totally understands these and agrees. Advises her to drink juice or eat crakers if it happens.  Complete Medication List: 1)  Glipizide 10 Mg Tabs (Glipizide) .... Take 1/2 tablet by mouth once a day 2)  Metformin Hcl 1000 Mg Tabs (Metformin hcl) .... Take onetabletby mouth two times a day with meals. 3)  Acetaminophen 325 Mg Tabs (Acetaminophen) .... 2 tabs every 6 hour as needed for pain 4)  Multi-vitamin/minerals Tabs (Multiple vitamins-minerals) .... Take 1 tablet by mouth once a day 5)  Lancets 30g Misc (Lancets) .... Use to test blood sugar 3x daily 6)  Insulin Syringe 31g X 5/16" 0.5 Ml Misc (Insulin syringe-needle u-100) .... Use to inject insulin twice daily 7)  Cyclobenzaprine Hcl 10 Mg Tabs (Cyclobenzaprine hcl) .... Take one tablet q12 hours as needed 8)  [employer] Test Strp (Glucose blood) .... Use to test blood sugar 3x daily  Other Orders: T-Urine Microalbumin w/creat. ratio [id number])  Patient Instructions: 1)  Please schedule a follow-up appointment in 2-3 weeks. 2)  If you feel sweating with sugar less than 70, please drink juice or eat crakers. 3)  Check your blood sugars regularly. If your readings are usually above : or below 70 you should contact our office. 4)  See your eye doctor yearly to check for diabetic eye damage. 5)  Check your feet each night for sore areas, calluses or signs of infection.   Orders Added: 1)  T- Capillary Blood Glucose [[id number]] 2)  T-Hgb A1C (in-house) [[id number]] 3)  T-Urine Microalbumin w/creat. ratio [[id number]] 4)  Podiatry Referral [Podiatry] 5)  Est. Patient Level III [16109]    Prevention & Chronic Care Immunizations   Influenza vaccine: Not documented   Influenza vaccine deferral: Refused  (09/19/2010)    Tetanus booster: Not documented    Pneumococcal vaccine: Not documented  Other Screening   Pap smear: Not documented   Pap  smear action/deferral: Deferred  (09/19/2010)    Mammogram: Not documented   Mammogram action/deferral: Deferred  (09/19/2010)   Smoking status: never  (09/19/2010)  Diabetes Mellitus   HgbA1C: 6.1  (09/19/2010)    Eye exam: Not documented    Foot exam: Not documented   Foot exam action/deferral: Do today   High risk foot: No  (09/19/2010)   Foot care education: Not documented    Urine microalbumin/creatinine ratio: Not documented   Urine microalbumin action/deferral: Ordered    Diabetes flowsheet reviewed?: Yes   Progress toward A1C goal: At goal  Lipids   Total Cholesterol: Not documented   LDL: Not documented   LDL Direct: Not documented   HDL: Not documented   Triglycerides: Not documented    SGOT (AST): Not documented   SGPT (ALT): Not documented   Alkaline phosphatase: Not documented   Total bilirubin: Not documented    Lipid flowsheet reviewed?: Yes   Progress toward LDL goal: At goal  Self-Management Support :   Personal Goals (by the next clinic visit) :     Personal A1C goal: 7  (09/19/2010)     Personal blood pressure goal: 130/80  (09/19/2010)     Personal LDL goal: 100  (09/19/2010)    Patient will work on the following items until the next clinic visit to reach self-care goals:     Medications and monitoring: check my blood sugar, examine my feet every day  (09/19/2010)     Eating: eat more vegetables  (09/19/2010)     Activity: take a 30 minute walk every day, take the stairs instead of the elevator  (09/19/2010)    Diabetes self-management support: Education handout, Written self-care plan  (09/19/2010)   Diabetes care plan printed   Diabetes education handout printed   Last diabetes self-management training by diabetes educator: 05/30/2010    Lipid self-management support: Education handout, Written self-care plan  (09/19/2010)   Lipid self-care plan printed.   Lipid education handout printed   Nursing Instructions: Diabetic foot exam  today    Laboratory Results   Blood Tests   Date/Time Received: September 19, 2010 1:51 PM Date/Time Reported: Alric Quan  September 19, 2010 1:51 PM   HGBA1C: 6.1%   (Normal Range: Non-Diabetic - 3-6%   Control Diabetic - 6-8%) CBG Random:: 46mg /dL  Comments: First CBG collected value of 51, repeated , 2nd CBG result of 46.  Reported to [employer]. CMA 09-19-10 at 1336  [facility]  September 19, 2010 1:53 PM     Process Orders Check Orders Results:     [facility]: ABN not required for this insurance Order queued for requisitioning for [facility]: September 19, 2010 3:10 PM Tests Sent for requisitioning (September 19, 2010 9:01 PM):     09/19/2010: [facility] -- T-Urine Microalbumin w/creat. ratio [[id number]] (signed)

## 2010-12-05 NOTE — Progress Notes (Signed)
Summary: Refill/gh  Phone Note Refill Request Message from:  Patient on August 05, 2010 9:11 AM  Refills Requested: Medication #1:  WAVESENSE PRESTO TEST  STRP use to test blood sugar 3x daily. Presto Testing Strips.  Last labs and visit was 06/26/2010.   Method Requested: Fax to Local Pharmacy Initial call taken by: Angelina Ok RN,  August 05, 2010 9:12 AM    Prescriptions: Tina [name] PRESTO TEST  STRP (GLUCOSE BLOOD) use to test blood sugar 3x daily  #100 x 11   Entered and Authorized by:   Zoila Shutter MD   Signed by:   Zoila Shutter MD on 08/05/2010   Method used:   Faxed to ...       [facility] (retail)       [address]       [place], [place]  [address]       Ph: [phone]       Fax: [phone]   RxID:   [id number]

## 2010-12-05 NOTE — Letter (Signed)
Summary: BLOOD GLUCOSE 08/15-08/24/11  BLOOD GLUCOSE 08/15-08/24/11   Imported By: Shon Hough 07/09/2010 15:19:54  _____________________________________________________________________  External Attachment:    Type:   Image     Comment:   External Document

## 2010-12-05 NOTE — Progress Notes (Signed)
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  They also require a list of her current meds.   Pt will pick uup.  Initial call taken by: Merrie Roof RN,  September 17, 2010 12:36 PM  Follow-up for Phone Call        Pt last seen Aug and [facility] for next three. She is on insulin and A1C elevated. Pt will need to be evaluated prior to any letter being written. She can be sch with Allena Katz or any [facility] resident if she needs in sooner Follow-up by: Blanch Media MD,  September 17, 2010 2:14 PM  Additional Follow-up for Phone Call Additional follow up Details #1::        pt scheduled for Thurs. for evaluation and to have letter written Additional Follow-up by: Merrie Roof RN,  September 17, 2010 5:14 PM

## 2010-12-05 NOTE — Progress Notes (Signed)
Summary: cbg monitor and script/ hla  Phone Note Call from Patient   Summary of Call: pt calls and says the [facility] only has the PRESTO WAVE  glucose monitor now, they have given her one and strips. also called flexeril to pharm Initial call taken by: Marin Roberts RN,  June 27, 2010 2:53 PM  Follow-up for Phone Call        noted alternative meter provided. Follow-up by: Jamison Neighbor RD,CDE,  July 01, 2010 9:09 AM    New/Updated Medications: WAVESENSE PRESTO TEST  STRP (GLUCOSE BLOOD) use to test blood sugar 3x daily

## 2010-12-09 ENCOUNTER — Telehealth (INDEPENDENT_AMBULATORY_CARE_PROVIDER_SITE_OTHER): Payer: Self-pay | Admitting: *Deleted

## 2010-12-19 NOTE — Progress Notes (Signed)
Summary: Last PAP Smear Update  Phone Note Other Incoming   Summary of Call: Per EChart last PAP was on November 29,2006 Tina [name]  December 09, 2010 5:20 PM

## 2011-01-14 LAB — GLUCOSE, CAPILLARY
Glucose-Capillary: 102 mg/dL — ABNORMAL HIGH (ref 70–99)
Glucose-Capillary: 46 mg/dL — ABNORMAL LOW (ref 70–99)

## 2011-01-15 ENCOUNTER — Encounter: Payer: Self-pay | Admitting: Internal Medicine

## 2011-01-18 LAB — LIPID PANEL
Cholesterol: 160 mg/dL (ref 0–200)
HDL: 30 mg/dL — ABNORMAL LOW (ref >39)
Total CHOL/HDL Ratio: 5.3 RATIO

## 2011-01-18 LAB — GLUCOSE, CAPILLARY
Glucose-Capillary: 110 mg/dL — ABNORMAL HIGH (ref 70–99)
Glucose-Capillary: 249 mg/dL — ABNORMAL HIGH (ref 70–99)
Glucose-Capillary: 277 mg/dL — ABNORMAL HIGH (ref 70–99)

## 2011-01-18 LAB — BASIC METABOLIC PANEL
BUN: 4 mg/dL — ABNORMAL LOW (ref 6–23)
CO2: 24 mEq/L (ref 19–32)
Chloride: 106 mEq/L (ref 96–112)
Creatinine, Ser: 0.67 mg/dL (ref 0.4–1.2)
Potassium: 3.8 mEq/L (ref 3.5–5.1)

## 2011-01-19 LAB — BASIC METABOLIC PANEL
BUN: 10 mg/dL (ref 6–23)
BUN: 4 mg/dL — ABNORMAL LOW (ref 6–23)
BUN: 4 mg/dL — ABNORMAL LOW (ref 6–23)
BUN: 6 mg/dL (ref 6–23)
CO2: 20 mEq/L (ref 19–32)
CO2: 22 mEq/L (ref 19–32)
CO2: 23 mEq/L (ref 19–32)
CO2: 23 mEq/L (ref 19–32)
CO2: 24 mEq/L (ref 19–32)
Calcium: 8.5 mg/dL (ref 8.4–10.5)
Calcium: 8.5 mg/dL (ref 8.4–10.5)
Calcium: 8.6 mg/dL (ref 8.4–10.5)
Calcium: 8.8 mg/dL (ref 8.4–10.5)
Chloride: 101 mEq/L (ref 96–112)
Chloride: 105 mEq/L (ref 96–112)
Chloride: 106 mEq/L (ref 96–112)
Chloride: 108 mEq/L (ref 96–112)
Creatinine, Ser: 0.59 mg/dL (ref 0.4–1.2)
Creatinine, Ser: 0.63 mg/dL (ref 0.4–1.2)
Creatinine, Ser: 0.64 mg/dL (ref 0.4–1.2)
Creatinine, Ser: 0.65 mg/dL (ref 0.4–1.2)
Creatinine, Ser: 0.8 mg/dL (ref 0.4–1.2)
GFR calc Af Amer: >60 mL/min (ref >60)
GFR calc Af Amer: >60 mL/min (ref >60)
GFR calc Af Amer: >60 mL/min (ref >60)
GFR calc non Af Amer: >60 mL/min (ref >60)
GFR calc non Af Amer: >60 mL/min (ref >60)
GFR calc non Af Amer: >60 mL/min (ref >60)
Glucose, Bld: 222 mg/dL — ABNORMAL HIGH (ref 70–99)
Glucose, Bld: 248 mg/dL — ABNORMAL HIGH (ref 70–99)
Glucose, Bld: 250 mg/dL — ABNORMAL HIGH (ref 70–99)
Glucose, Bld: 313 mg/dL — ABNORMAL HIGH (ref 70–99)
Glucose, Bld: 442 mg/dL — ABNORMAL HIGH (ref 70–99)
Potassium: 3.5 mEq/L (ref 3.5–5.1)
Potassium: 3.8 mEq/L (ref 3.5–5.1)
Potassium: 4.1 mEq/L (ref 3.5–5.1)
Sodium: 134 mEq/L — ABNORMAL LOW (ref 135–145)
Sodium: 135 mEq/L (ref 135–145)
Sodium: 136 mEq/L (ref 135–145)
Sodium: 137 mEq/L (ref 135–145)

## 2011-01-19 LAB — COMPREHENSIVE METABOLIC PANEL
Alkaline Phosphatase: 95 U/L (ref 39–117)
BUN: 9 mg/dL (ref 6–23)
CO2: 18 mEq/L — ABNORMAL LOW (ref 19–32)
Chloride: 98 mEq/L (ref 96–112)
Glucose, Bld: 591 mg/dL (ref 70–99)
Potassium: 4 mEq/L (ref 3.5–5.1)
Total Bilirubin: 0.5 mg/dL (ref 0.3–1.2)

## 2011-01-19 LAB — POCT URINALYSIS DIP (DEVICE)
Glucose, UA: 500 mg/dL — AB
Ketones, ur: 80 mg/dL — AB
Protein, ur: NEGATIVE mg/dL
Specific Gravity, Urine: <=1.005 (ref 1.005–1.030)

## 2011-01-19 LAB — GLUCOSE, CAPILLARY
Glucose-Capillary: 182 mg/dL — ABNORMAL HIGH (ref 70–99)
Glucose-Capillary: 198 mg/dL — ABNORMAL HIGH (ref 70–99)
Glucose-Capillary: 229 mg/dL — ABNORMAL HIGH (ref 70–99)
Glucose-Capillary: 235 mg/dL — ABNORMAL HIGH (ref 70–99)
Glucose-Capillary: 259 mg/dL — ABNORMAL HIGH (ref 70–99)
Glucose-Capillary: 265 mg/dL — ABNORMAL HIGH (ref 70–99)
Glucose-Capillary: 301 mg/dL — ABNORMAL HIGH (ref 70–99)
Glucose-Capillary: 359 mg/dL — ABNORMAL HIGH (ref 70–99)

## 2011-01-19 LAB — DIFFERENTIAL
Basophils Absolute: 0 10*3/uL (ref 0.0–0.1)
Basophils Relative: 0 % (ref 0–1)
Neutro Abs: 6.6 10*3/uL (ref 1.7–7.7)
Neutrophils Relative %: 62 % (ref 43–77)

## 2011-01-19 LAB — CBC
HCT: 38.5 % (ref 36.0–46.0)
MCV: 77.8 fL — ABNORMAL LOW (ref 78.0–100.0)
RBC: 4.95 MIL/uL (ref 3.87–5.11)
WBC: 10.7 10*3/uL — ABNORMAL HIGH (ref 4.0–10.5)

## 2011-01-19 LAB — HEMOGLOBIN A1C: Mean Plasma Glucose: 309 mg/dL — ABNORMAL HIGH (ref <117)

## 2011-01-19 LAB — KETONES, QUALITATIVE: Acetone, Bld: NEGATIVE

## 2011-01-19 LAB — POCT PREGNANCY, URINE: Preg Test, Ur: NEGATIVE

## 2011-01-19 LAB — MRSA PCR SCREENING: MRSA by PCR: NEGATIVE

## 2011-01-22 ENCOUNTER — Encounter: Payer: Self-pay | Admitting: Internal Medicine

## 2011-01-22 ENCOUNTER — Ambulatory Visit (INDEPENDENT_AMBULATORY_CARE_PROVIDER_SITE_OTHER): Payer: Self-pay | Admitting: Internal Medicine

## 2011-01-22 DIAGNOSIS — L84 Corns and callosities: Secondary | ICD-10-CM

## 2011-01-22 DIAGNOSIS — E119 Type 2 diabetes mellitus without complications: Secondary | ICD-10-CM

## 2011-01-22 DIAGNOSIS — E785 Hyperlipidemia, unspecified: Secondary | ICD-10-CM

## 2011-01-22 LAB — GLUCOSE, CAPILLARY: Glucose-Capillary: 100 mg/dL — ABNORMAL HIGH (ref 70–99)

## 2011-01-22 LAB — BASIC METABOLIC PANEL
BUN: 10 mg/dL (ref 6–23)
Calcium: 10 mg/dL (ref 8.4–10.5)
Chloride: 101 mEq/L (ref 96–112)
Glucose, Bld: 86 mg/dL (ref 70–99)
Sodium: 138 mEq/L (ref 135–145)

## 2011-01-22 LAB — POCT GLYCOSYLATED HEMOGLOBIN (HGB A1C): Hemoglobin A1C: 6.2

## 2011-01-22 MED ORDER — CYCLOBENZAPRINE HCL 10 MG PO TABS: 10.0000 mg | ORAL_TABLET | Freq: Two times a day (BID) | ORAL | Status: DC | PRN

## 2011-01-22 MED ORDER — GLIPIZIDE 10 MG PO TABS: 5.0000 mg | ORAL_TABLET | ORAL | Status: DC

## 2011-01-22 MED ORDER — GLUCOSE BLOOD VI STRP: ORAL_STRIP | Status: DC

## 2011-01-22 MED ORDER — METFORMIN HCL 1000 MG PO TABS: 1000.0000 mg | ORAL_TABLET | Freq: Two times a day (BID) | ORAL | Status: DC

## 2011-01-22 NOTE — Assessment & Plan Note (Signed)
Will check FLP during next visit. 

## 2011-01-22 NOTE — Progress Notes (Signed)
  Subjective:    [name], female    DOB: [birth year], 40 y.o.   MRN: [id number]  HPI Tina [name] is a pleasant 40 yo lady w/ PMH of DM, talipus equinovarus R foot, who comes to the clinic for regular f/u visit. She c/o thickened skin behind her L heel, which occasionally gets painful after wearing shoes for a long time. It is not gettig better despite measures told by Dr. Nedra Hai. She also wears special shoes per Dr. Nedra Hai- Sports Medicine- and her callus in L plantar area is getting better after that. She also has broken upper front teeth and wants a dentist referral for that. She also c/o intermittent leg cramps which wakes her up at night and she has to move around, with littke relief in pain. Also she has menorrhagia for a long time now, has menstrual bleed for about 7 days each period and feels weak during the periods and more fatigued.    Review of Systems  Constitutional: Negative.   HENT: Negative.   Eyes: Negative.   Respiratory: Negative.   Cardiovascular: Negative.   Gastrointestinal: Negative.   Genitourinary: Negative for dysuria, difficulty urinating and dyspareunia.  Neurological: Negative.   Hematological: Negative.   Psychiatric/Behavioral: Negative.        Objective:   Physical Exam  Constitutional: She is oriented to person, place, and time. She appears well-developed and well-nourished.  HENT:  Head: Normocephalic and atraumatic.  Mouth/Throat: Oropharynx is clear and moist.  Eyes: Conjunctivae and EOM are normal. Pupils are equal, round, and reactive to light.  Neck: Normal range of motion. Neck supple. No thyromegaly present.  Cardiovascular: Normal rate and regular rhythm.  Exam reveals no gallop and no friction rub.   No murmur heard. Pulmonary/Chest: Effort normal and breath sounds normal. No respiratory distress. She has no wheezes. She has no rales. She exhibits no tenderness.  Abdominal: Soft. Bowel sounds are normal. She exhibits  no distension. There is no tenderness.  Musculoskeletal: Normal range of motion. She exhibits no edema and no tenderness.  Neurological: She is alert and oriented to person, place, and time.  Skin: Skin is warm and dry.  Psychiatric: She has a normal mood and affect. Her behavior is normal.          Assessment & Plan:

## 2011-01-22 NOTE — Patient Instructions (Signed)
Please make  A follow up appointment in 3 months and come fasting for at least 8 hrs, so we can check an lipid panel. Also take all your medicines regularly. We will refer you to foot doctor, eye doctor and also dentist as we discussed.

## 2011-01-22 NOTE — Assessment & Plan Note (Addendum)
Improving after started using the shoes per Dr. Nedra Hai- Sports Medicine. But behind her L heel, there is thickening and darkening of skin, which was addressed and managed without much improvement. So will set an f/u appointment with [facility] for helping out with this.

## 2011-01-22 NOTE — Assessment & Plan Note (Signed)
HbA1c is 6.2. Does walking and diet for diabetes control. Also is highly motivated and so will refer to Jamison Neighbor for diabetes education

## 2011-01-23 LAB — CBC
HCT: 35 % — ABNORMAL LOW (ref 36.0–46.0)
MCH: 25.5 pg — ABNORMAL LOW (ref 26.0–34.0)
Platelets: 482 10*3/uL — ABNORMAL HIGH (ref 150–400)
RBC: 4.16 MIL/uL (ref 3.87–5.11)
RDW: 15.5 % (ref 11.5–15.5)
WBC: 10.6 10*3/uL — ABNORMAL HIGH (ref 4.0–10.5)

## 2011-01-28 ENCOUNTER — Encounter: Payer: Self-pay | Admitting: Internal Medicine

## 2011-01-31 ENCOUNTER — Ambulatory Visit (INDEPENDENT_AMBULATORY_CARE_PROVIDER_SITE_OTHER): Payer: Self-pay | Admitting: Family Medicine

## 2011-01-31 ENCOUNTER — Encounter: Payer: Self-pay | Admitting: Family Medicine

## 2011-01-31 DIAGNOSIS — L84 Corns and callosities: Secondary | ICD-10-CM

## 2011-01-31 DIAGNOSIS — M2140 Flat foot [pes planus] (acquired), unspecified foot: Secondary | ICD-10-CM

## 2011-01-31 NOTE — Progress Notes (Signed)
  Subjective:    [name], female    DOB: 03-Jul-1971, 40 y.o.   MRN: [id number]  HPI 40 year old female referred back to [place] by medicine clinic for reevaluation of her feet. She has diabetes as well some significant foot changes including pes planus right greater than left, a history of ankle ORIFon the right ankle, and leg length difference because of prior left hip pin fixation for slipped capital femoral epiphysis. She states the diabetic custom orthotics I made her are still working well, however she persists in having some left posterior heel callus that here IM clinic doctor is worried about, so she presents today for reevaluation.   Review of Systems Denies fevers, sweats, chills, weight loss. Diabetes is still in good control    Objective:   Physical Exam Gen. appearance tall overweight female no distress Right foot: severe as planus, with over pronation and ulnar deviation of the toes. She has some darkened callus on the proximal lateral aspect of the plantar foot, but no gross skin breakdown. Left foot: As planus with over pronation. She persists in having a small thick callus at the base of the second metatarsal head on the plantar aspect, but no skin breakdown. Posterior left heel shows some thickened hypertrophic dark callus without skin breakdown. On the plantar aspect she has some similar dark beginnings of callus formation just as the right foot does. Neuro: Sensation is intact to light touch Vascular: 2+ dorsalis pedis pulses       Assessment & Plan:  40 year old female with poor foot structure and some hypertrophied callus worse on the left and right foot. -Placed an unna boot on the left foot, she will wear this through the weekend, and follow up with Dr. Jennette Kettle on Monday afternoon. -We'll then plan for her to see me next Wednesday -At this time reassured her that she had no signs of cellulitis or infection -We also placed a new metatarsal tongue on  her left custom orthotic and her separate issue insole

## 2011-02-03 ENCOUNTER — Ambulatory Visit (INDEPENDENT_AMBULATORY_CARE_PROVIDER_SITE_OTHER): Payer: Self-pay | Admitting: Family Medicine

## 2011-02-03 ENCOUNTER — Encounter: Payer: Self-pay | Admitting: Family Medicine

## 2011-02-03 VITALS — BP 128/79 | HR 93

## 2011-02-03 DIAGNOSIS — M79673 Pain in unspecified foot: Secondary | ICD-10-CM

## 2011-02-03 DIAGNOSIS — M79609 Pain in unspecified limb: Secondary | ICD-10-CM

## 2011-02-03 NOTE — Progress Notes (Signed)
  Subjective:    [name], female    DOB: [birth year], 40 y.o.   MRN: [id number]  HPI  Here for followup of her left foot. We are seeing her for some lichenified skin on her medial healed. We have tried her in a Unna boot for several days. She has no new symptoms.  Review of Systems    Pertinent review of systems: negative for fever or unusual weight change.  Objective:   Physical Exam  overweight female no acute distress Left foot medial heel reveals thickened lichenified hyperpigmented skin. 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 We will switch her to urea cream. She will use that after hot soaks and pumice stone use daily. She can followup with [place] or with her regular provider. We would be happy to see her back. She says she has some orthotics and does not wish to pursue getting new ones at this time.

## 2011-02-06 ENCOUNTER — Inpatient Hospital Stay (INDEPENDENT_AMBULATORY_CARE_PROVIDER_SITE_OTHER)
Admission: RE | Admit: 2011-02-06 | Discharge: 2011-02-06 | Disposition: A | Discharge status: Home / Self Care | Payer: Self-pay | Source: Ambulatory Visit | Attending: Family Medicine | Admitting: Family Medicine

## 2011-02-06 DIAGNOSIS — B9789 Other viral agents as the cause of diseases classified elsewhere: Secondary | ICD-10-CM

## 2011-02-06 DIAGNOSIS — J309 Allergic rhinitis, unspecified: Secondary | ICD-10-CM

## 2011-03-06 ENCOUNTER — Ambulatory Visit (INDEPENDENT_AMBULATORY_CARE_PROVIDER_SITE_OTHER): Payer: Self-pay | Admitting: Dietician

## 2011-03-06 DIAGNOSIS — E1110 Type 2 diabetes mellitus with ketoacidosis without coma: Secondary | ICD-10-CM

## 2011-03-06 NOTE — Progress Notes (Signed)
Diabetes Self-Management Training (DSMT)  Follow-Up 1 Visit- (Previous visit was 05/30/10)  03/06/2011 Ms. Nolon Rod, identified by name and date of birth, is a 40 y.o. female with Type 2 Diabetes. Year of diabetes diagnosis: 2011 Other persons present: no  ASSESSMENT Patient concerns are Glycemic control and Weight control.  There were no vitals taken for this visit. There is no height or weight on file to calculate BMI. Lab Results  Component Value Date   LDLCALC  Value: 71        Total Cholesterol/HDL:CHD Risk Coronary Heart Disease Risk Table                     Men   Women  1/2 Average Risk   3.4   3.3  Average Risk       5.0   4.4  2 X Average Risk   9.6   7.1  3 X Average Risk  23.4   11.0        Use the calculated Patient Ratio above and the CHD Risk Table to determine the patient's CHD Risk.        ATP III CLASSIFICATION (LDL):  <100     mg/dL   Optimal  161-096  mg/dL   Near or Above                    Optimal  130-159  mg/dL   Borderline  045-409  mg/dL   High  >811     mg/dL   Very High 07/17/7828   Lab Results  Component Value Date   HGBA1C 6.2 01/22/2011   Medication Nutrition Monitor: presto form [employer] reviewed.  DIABETES BUNDLE: A1C in past 6 months? Yes.  Less than 7%? Yes LDL in past year? Yes.  Less than 100 mg/dL? Yes Microalbumin ratio in past year? Yes. Blood pressure less than 130/80? Yes. Foot exam in last year? Yes. Eye exam in past year? No.  Reminded patient to schedule eye exam. Tobacco use? Yes.  Smoking cessation offered? No Pneumovax? No Flu vaccine? No Asprin? Yes  Family history of diabetes: Yes  Support systems: friends parents  Special needs: None  Prior DM Education: Yes   Medications See Medications list.  Is interested in learning more  Patients belief/attitude about diabetes: Diabetes can be controlled.  Self foot exams daily: Yes  Diabetes Complications: None   Exercise Plan Doing walking for 30  minutesa day.   Self-Monitoring Frequency of testing: 1-2 times/day Breakfast: 107-118 Supper:90-96 Bedtime: 105-167  Hyperglycemia: No Hypoglycemia: Yes Rarely   Meal Planning Limited knowledge, Some knowledge and Interested in improving     INDIVIDUAL DIABETES EDUCATION PLAN:  Diabetes disease state Nutrition management Physical activity and exercise Monitoring _______________________________________________________________________  Intervention TOPICS COVERED TODAY:  Diabetes disease state Definition of diabetes, type 1 and 2, and the diagnosis of diabetes. Factors that contribute to the development of diabetes. Nutrition management  Role of diet in the treatment of diabetes and the relationship between the three main macronutritents and blood glucose control. Food label reading, portion sizes and measuring food. Reviewed blood glucose goals for pre and post meals and how to evaluate the patients' food intake on their blood glucose level. Meal timing in regards to the patients' current diabetes medication. Physical activity and exercise  Role of exercise on diabetes management, blood pressure control and cardiac health. Monitoring  Purpose and frequency of SMBG. Taught/discussed recording of test results and interpretation of  SMBG. Interpreting lab values - A1C, lipid, urine microalbumina.  PATIENTS GOALS/PLAN (copy and paste in patient instructions so patient receives a copy): 1.  Learning Objective:       State basic principles of meal planning with diabetes             State blood sugar goals              State standards of care with diabetes              State importance of adequate rest to care of diabetes 2.  Behavioral Objective:         Nutrition: To improve blood glucose control I will follow meal plan of 4-6 carb portions at meals and 102 with snacks Sometimes 25% Physical Activity: For improved blood glucose control and decrease insulin resistance, I will get  application form [employer], complete and turn in  Never 0% Monitoring: To identify blood glucose trends, I will test my blood glucose 1 time a day until three days prior to office vist then test 3 times daily for 3 days up to doctor visit day Most of the time 75%  Personalized Follow-Up Plan for Ongoing Self Management Support:  Doctor's Office, friends, family and CDE visits ______________________________________________________________________   Outcomes Expected outcomes: Demonstrated interest in learning.Expect positive changes in lifestyle.  Self-care Barriers: Lack of material resources  Education material provided: her own labs, meal plan for ~ 60 grams carb at meals and 15-30 for snacks Patient to contact team via Phone if problems or questions.  Time in: 1100     Time out: 1200  Future DSMT - 6 months   RILEY,DONNA

## 2011-03-06 NOTE — Patient Instructions (Signed)
Meal plan:  Suggest 4-6 servings of carb each meal and 1-2 for snacks Carbs spread out over day some for breakfast, lunch and dinner and a little for snacks Fat- try to eat a moderate amount with each meal/snack (makes you gain weight and increases your sugar- more fat makes your pancreas not produce as much insulin)   Blood pressure- Low sodium- under 100 is usually okay, over 200 is too high per serving Eat nuts at least every other day   Eat 2 dairy servings day  Remember [facility] application   Make a follow up in 6 months

## 2011-03-13 ENCOUNTER — Telehealth: Payer: Self-pay | Admitting: *Deleted

## 2011-03-13 NOTE — Telephone Encounter (Signed)
CALLED PATIENT TO GET HER SCHEDULED FOR [facility] / LEFT VOICE MESSAGE FOR HE TO CALL ME BACK AT [phone], SO I CAN GET HER SCHEDULED.

## 2011-03-21 ENCOUNTER — Inpatient Hospital Stay (INDEPENDENT_AMBULATORY_CARE_PROVIDER_SITE_OTHER)
Admission: RE | Admit: 2011-03-21 | Discharge: 2011-03-21 | Disposition: A | Discharge status: Home / Self Care | Payer: Self-pay | Source: Ambulatory Visit

## 2011-03-21 DIAGNOSIS — N390 Urinary tract infection, site not specified: Secondary | ICD-10-CM

## 2011-03-21 LAB — POCT URINALYSIS DIP (DEVICE)
Bilirubin Urine: NEGATIVE
Ketones, ur: NEGATIVE mg/dL
Specific Gravity, Urine: 1.025 (ref 1.005–1.030)

## 2011-03-21 LAB — POCT PREGNANCY, URINE: Preg Test, Ur: NEGATIVE

## 2011-03-21 NOTE — H&P (Signed)
NAME:  Tina [name], Tina [name]                    ACCOUNT NO.:  [id number]   MEDICAL RECORD NO.:  [id number]                   PATIENT TYPE:  IPS   LOCATION:  0303                                 [facility]ACILITY[facility]  [facility]   PHYSICIAN:  Jeanice Lim, M.D.              DATE OF BIRTH:  [birth year]   DATE OF ADMISSION:  01/30/2004  DATE OF DISCHARGE:                         PSYCHIATRIC ADMISSION ASSESSMENT   IDENTIFYING INFORMATION:  A 40 year old single African-American female,  voluntarily admitted on January 30, 2004.   HISTORY OF PRESENT ILLNESS:  The patient presents with a history of  depression and suicidal thoughts.  The patient is a transfer from [facility]  after the patient was admitted there for intractable leg pain.  The patient  has a history of degenerative disk disease and sciatica.  The patient has  been expressing feelings of hopelessness and suicidal thoughts to kill  herself.  The patient states that she is unable to do what she used to do.  She is unable to work, especially with her continuing health problems.  Her  plan was to cut her wrists.  The patient reports no other significant  stressors at this time.  Her sleeping has been decreased because of her  pain.  Her appetite has been satisfactory.  She denies any psychotic  symptoms.   PAST PSYCHIATRIC HISTORY:  First admission to [facility], no  history of a suicide attempt, no other psychiatric admissions, no outpatient  treatment.   SOCIAL HISTORY:  She is a 40 year old single African-American female with no  children.  She lives alone.  She has no legal problems.  She has been  working in [employer].  Completed high school.   FAMILY HISTORY:  Denies.   ALCOHOL DRUG HISTORY:  The patient smokes, denies any alcohol or drug use.   PAST MEDICAL HISTORY:  Primary care Jolanta Cabeza is [facility].  Medical problems are sciatica and slipped disk.   MEDICATIONS:  Has been on morphine 30 mg b.i.d.,  Protonix 40 mg daily,  ferrous sulfate 1 t.i.d., Skelaxin 800 mg t.i.d., MiraLax 17 grams in 8  ounces of water for constipation.   DRUG ALLERGIES:  CODEINE.   PHYSICAL EXAMINATION:  The patient was assessed at [facility].  Physical  examination was reviewed.  The patient at this time is uncomfortable, keeps  shifting her weight.  When the patient was assessed she initially had  decreased sensation and decreased reflexes on the right.  The patient also  has a quarter-sized lesion on her right median malleolus on her right foot.  Vital signs 98.6, 78 heart rate, 18 respirations, blood pressure  is 118/71.  She is tall, approximately 5 feet 9-10 inches tall.  She is 288 pounds.   LABORATORY DATA:  On March 26, her hemoglobin 11.6, 34.9 hematocrit.  TSH is  1.095, blood sugar is 116.   MENTAL STATUS EXAM:  She is awake, obese female, cooperative,  fair eye  contact, casually dressed.  Speech clear.  The patient feels overwhelmed and  depressed.  Affect is teary-eyed, flat.  Thought processes are coherent, no  evidence of psychosis, cognitive function intact.  Memory is good, judgment  is good, insight is fair.  She appears sincere.   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   AXIS II:  Deferred.   AXIS III:  Sciatica, iron deficiency anemia, obesity.   AXIS IV:  Medical problems, financial problems.   AXIS V:  Current is 30, past year 35.   PLAN:  Voluntary admission for depression, suicidal ideation.  Contract for  safety, stabilize mood and thinking.  We will initiate an antidepressant.  Risks and benefits were discussed.  We will continue with PT evaluation.  Will resume other medications as dictated from hospital records from  transfer.  The patient is to increase coping skills, to follow up at mental  health.   TENTATIVE LENGTH OF CARE:  4-6 days or more depending on the patient's  response to medication.     Landry Corporal, N.P.                        Jeanice Lim, M.D.    JO/MEDQ  D:  02/02/2004  T:  02/03/2004  Job:  [id number]

## 2011-03-21 NOTE — Discharge Summary (Signed)
NAME:  Tina [name], Tina [name]                    ACCOUNT NO.:  [id number]   MEDICAL RECORD NO.:  [id number]                   PATIENT TYPE:  INP   LOCATION:  3028                                 FACILITY:  [facility]   PHYSICIAN:  Fransisco Hertz, M.D.               DATE OF BIRTH:  1971/04/04   DATE OF ADMISSION:  01/27/2004  DATE OF DISCHARGE:  01/30/2004                                 DISCHARGE SUMMARY   DISCHARGE DIAGNOSES:  1. Sciatica L5-S1.  2. Iron deficiency anemia.  3. Obesity.  4. Depression.  5. Bradycardia.  6. Leukocytosis.   DISCHARGE MEDICATIONS:  Dictation ended at this point.      Foye Clock, MD                         Fransisco Hertz, M.D.    JH/MEDQ  D:  02/12/2004  T:  02/13/2004  Job:  [id number]

## 2011-03-21 NOTE — Discharge Summary (Signed)
NAME:  Tina [name], Tina [name]                    ACCOUNT NO.:  [id number]   MEDICAL RECORD NO.:  [id number]                   PATIENT TYPE:  IPS   LOCATION:  0303                                 [facility]ACILITY:  [facility]   PHYSICIAN:  Jeanice Lim, M.D.              DATE OF BIRTH:  04-04-1971   DATE OF ADMISSION:  01/30/2004  DATE OF DISCHARGE:  02/03/2004                                 DISCHARGE SUMMARY   IDENTIFYING DATA:  This is a 40 year old single African-American female,  presenting with a history of depression and suicidal ideation, feeling  hopeless, unable to do what she used to do, having thoughts about cutting  wrist, reporting decreased sleep in part secondary to pain.   ADMISSION MEDICATIONS:  Protonix, iron, Skelaxin.   ALLERGIES:  CODEINE.   PHYSICAL EXAMINATION:  Within normal limits, neurologically nonfocal.   ROUTINE ADMISSION LABS:  Within normal limits.   MENTAL STATUS EXAM:  Awake, morbidly obese, cooperative, fair eye contact.  Speech clear, mood depressed, overwhelmed, teary-eyed.  Thought process goal  directed, no evidence of psychotic symptoms.  Cognitively intact.  Judgment  and insight poor.   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   AXIS II:  Deferred.   AXIS III:  Sciatica, apnea, morbid obesity.   AXIS IV:  Moderate, problems with primary support group and related to  medical problems.   AXIS V:  30/65.   HOSPITAL COURSE:  The patient was admitted and ordered routine p.r.n.  medications, underwent further monitoring, and was encouraged to participate  in individual, group and milieu therapy.  PT evaluation.  The patient was to  continue home medications and was optimized on Cymbalta, Neurontin for pain,  Ambien to restore sleep.  The patient reported a positive improvement with  medication changes and showed some improvement, even in the short duration  of hospitalization, regarding her depressive symptoms.   There was no  dangerous ideation or psychotic symptoms.  She was discharged in improved  condition with motivation to be compliant with the aftercare plan.  The  patient was given medication education.   DISCHARGE MEDICATIONS:  1. Morphine sulfate 30 mg b.i.d., MS Contin.  2. Protonix 40 mg daily.  3. Iron 325 t.i.d.  4. Skelaxin 800 mg t.i.d.  5. Cymbalta 60 mg daily.  6. Ambien 10 mg q.h.s.  7. Risperdal 0.5 mg q.h.s.  8. Neurontin 300 mg 2 pills at bedtime.  9. Tegretol 100 mg q.a.m. and 300 q.h.s.  10.      Naproxen 500 mg one pill at noon and 9.   DISPOSITION:  The patient was to follow up with primary care physician and  with [facility] on April 4 at 3:30.   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   AXIS II:  Deferred.   AXIS III:  Sciatica, apnea, morbid obesity.   AXIS  IV:  Moderate, problems with primary support group and related to  medical problems.   AXIS V:  Global assessment of function on discharge was 55-60.                                               Jeanice Lim, M.D.    JEM/MEDQ  D:  02/28/2004  T:  03/01/2004  Job:  [id number]

## 2011-03-21 NOTE — Discharge Summary (Signed)
NAME:  Tina [name], Tina [name]                    ACCOUNT NO.:  [id number]   MEDICAL RECORD NO.:  [id number]                   PATIENT TYPE:  EMS   LOCATION:  URG                                  FACILITY:  [facility]   PHYSICIAN:  Foye Clock, MD                   DATE OF BIRTH:  09-02-1971   DATE OF ADMISSION:  DATE OF DISCHARGE:                                 DISCHARGE SUMMARY   ADDENDUM:   DISCHARGE MEDICATIONS:  1. Ferrous sulfate 525 mg p.o. t.i.d.  2. Cymbalta 30 mg p.o. daily.  3. Celexa 300 mg p.o. t.i.d.  4. MS Contin 30 mg p.o. b.i.d.  5. Morphine sulfate IR 50 mg p.o. q.8h. p.r.n. break-through pain.  6. Protonix 40 mg p.o. daily.   CONDITION ON DISCHARGE:  Guarded.   PROCEDURE:  On January 27, 2004, the patient had a full view of the  lumbosacral spine that showed mild degenerative disk disease changes at L3  to L4.  No acute abnormalities.   CONSULTATIONS:  None.   HISTORY OF PRESENT ILLNESS:  Please see the chart for full details.  In  brief, the patient is a 40 year old African-American woman with known  lumbosacral spine disk disease.  An MRI was done in January 2005.  This  showed that the last four days prior to admission, had severe unbearable  right lower extremity pain, starting in the buttocks and radiating down to  the back of the thigh and the back of the leg, associated with pulling the  foot.  She had these symptoms in the past, but never this severe or  radiating to the back.  No history of trauma.  No fever.  She was seen in  the emergency room with a direct admission.  She had a negative lower  extremity ultrasound for a deep vein thrombosis.  She was discontinued on  prednisone and Percocet, which had not helped at all.   PHYSICAL EXAMINATION:  VITAL SIGNS:  Temperature 98.1 degrees, pulse 50,  respirations 20, blood pressure 114/52, saturation 97% on room air.  GENERAL:  She is lying in bed in acute distress.  HEENT:  Eyes:  Equally round and  reactive to light and accommodation.  Pupils and extraocular movements intact.  ENT:  Clear.  NECK:  No jugular venous distention.  LUNGS:  Clear to auscultation bilaterally.  CARDIOVASCULAR:  A regular rate and rhythm.  No murmurs, rubs, or gallops.  ABDOMEN:  Soft, nontender, nondistended.  Positive bowel sounds.  EXTREMITIES:  Positive Lasegue's on the right, negative on the left, with 1+  pitting edema bilaterally.  SKIN:  Warm and dry.  NODES:  No lymphadenopathy.  MUSCULOSKELETAL:  Strength is intact.  NEUROLOGIC:  Decrease in sensation in the lateral aspect of the right foot.  Decreased Achilles reflex on the right.  Otherwise within normal limits.  She is alert and oriented x4.   HOSPITAL COURSE:  Problem #1 -  SCIATIC PAIN:  The patient has been seen  multiple times in the emergency room and in the Acute Care Clinic at [facility]. [facility] for the sciatic pain.  She was started on NSAID's  and opiates for this without any severe relief.  Therefore the main stay of  treatment in this patient is hospitalization with adequate pain control.  The patient was initially started on a morphine PCA pump and was changed  after two days to a p.o. regimen for her pain control.  She was given MS  Contin 30 mg p.o. b.i.d. plus MSIR for break-through relief which had some  adequate pain control at rest, still titrating the pain medication, so that  the patient could increase mobility during her hospitalization.  Also  physical therapy was consulted and suggested some exercise that the patient  could continue on an outpatient basis, to help with her sciatic nerve pain  and low back pain.  It was their recommendation that the patient continue  outpatient physical therapy, and this was agreed upon.  The patient was  presented with the idea that she could be a candidate for neurological  surgery; however, the patient at that time preferred to optimize the pain  management with p.o.  narcotics before proceeding with surgery.  Therefore no  consultation was called.   Problem #2 - DEPRESSION:  The patient had multiple times during this  hospital course where she was tearful.  She had mentioned that she had had  some suicidal ideations with a plan because of the recent pain that she has  been through and the recent changes that she has had, and therefore it was  thought that a psychiatric consultation would be beneficial.  It was the  impression by the consultant, Dr. Antonietta Breach, that the patient was at  risk for suicide and she was a harm to herself and therefore it was thought  that she would benefit from an inpatient stay.  It was also thought that she  would benefit from Cymbalta, which has indications for treatment of chronic  pain, in addition to end depression.  Therefore the patient was sent to  [facility] for a voluntary commitment, where she will continue her  inpatient care there for depression with suicidal ideations.   Problem #3 - ANEMIA:  The patient has had a previous workup for her anemia  that is felt to be iron deficient.  Therefore she was to continue p.o.  supplementation with iron.   Problem #4 - BRADYCARDIA:  The etiology of this is unknown.  An  electrocardiogram was obtained and was found to be within normal limits.  As  the patient had no chest complaints, no cardiac complaints, no further  workup was sought during this hospitalization.   Problem #5 - LEUKOCYTOSIS:  It was noted on admission that the patient's  white blood cell count was elevated at 13.6.  It was thought to be after  reviewing of the patient's medication, possibly secondary to the prednisone  she was given prior to admission by the emergency room staff here at [facility]. [facility], and no further workup was required.  She had no  other signs and symptoms that this could possibly be secondary to infection.  DISCHARGE LABORATORY DATA:  CBC showed a  white blood cell count of 10,  hemoglobin 12.1, hematocrit 76.4, platelet count 431.  TSH 1.1.  Sodium 139,  potassium 3.8, chloride 103, CO2 of 28, glucose  121, BUN 7, creatinine 0.9.  Liver enzymes within normal limits.   FOLLOWUP/DISPOSITION:  The patient is to follow up in the Acute Care Center  after discharge from the [facility], as she had previously  sought her medical care there.  At that time it is recommended that she be  evaluated for her sciatic pain and if required, continue on p.o. narcotics  and additionally be sent for outpatient physical therapy for evaluation and  treatment.                                                Foye Clock, MD   JH/MEDQ  D:  02/12/2004  T:  02/13/2004  Job:  [id number]

## 2011-03-31 ENCOUNTER — Encounter: Payer: Self-pay | Admitting: Internal Medicine

## 2011-05-19 ENCOUNTER — Encounter: Payer: Self-pay | Admitting: Internal Medicine

## 2011-06-27 ENCOUNTER — Ambulatory Visit (INDEPENDENT_AMBULATORY_CARE_PROVIDER_SITE_OTHER): Payer: Self-pay | Admitting: Internal Medicine

## 2011-06-27 ENCOUNTER — Encounter: Payer: Self-pay | Admitting: Internal Medicine

## 2011-06-27 VITALS — BP 133/79 | HR 89 | Temp 99.0°F | Ht 76.0 in | Wt 305.4 lb

## 2011-06-27 DIAGNOSIS — Z0000 Encounter for general adult medical examination without abnormal findings: Secondary | ICD-10-CM | POA: Insufficient documentation

## 2011-06-27 DIAGNOSIS — E119 Type 2 diabetes mellitus without complications: Secondary | ICD-10-CM

## 2011-06-27 DIAGNOSIS — R5381 Other malaise: Secondary | ICD-10-CM

## 2011-06-27 DIAGNOSIS — E785 Hyperlipidemia, unspecified: Secondary | ICD-10-CM

## 2011-06-27 DIAGNOSIS — R5383 Other fatigue: Secondary | ICD-10-CM | POA: Insufficient documentation

## 2011-06-27 DIAGNOSIS — L84 Corns and callosities: Secondary | ICD-10-CM

## 2011-06-27 LAB — GLUCOSE, CAPILLARY: Glucose-Capillary: 134 mg/dL — ABNORMAL HIGH (ref 70–99)

## 2011-06-27 LAB — POCT GLYCOSYLATED HEMOGLOBIN (HGB A1C): Hemoglobin A1C: 5.9

## 2011-06-27 MED ORDER — METFORMIN HCL 1000 MG PO TABS: 1000.0000 mg | ORAL_TABLET | Freq: Two times a day (BID) | ORAL | Status: DC

## 2011-06-27 NOTE — Assessment & Plan Note (Signed)
Well controlled. Discussed lifestyle changes including diet and exercise. Will not start on any meds for now.

## 2011-06-27 NOTE — Assessment & Plan Note (Signed)
Refused flu shot, pneumococcal shot today. States that she would like to get re-scheduled for a pelvic exam next week.

## 2011-06-27 NOTE — Progress Notes (Signed)
  Subjective:    [name], female    DOB: June 22, 1971, 40 y.o.   MRN: [id number]  HPI: 40 year old woman with past medical history significant for type 2 diabetes mellitus, hyperlipidemia comes to the clinic for followup visit.  She complains of feeling tired and weak for the last one week. He denies any systemic complaints including fever, chills, chest pain, palpitations, bladder or bowel complaints.  She states that she needs to get a regular pelvic exam but not today.     Review of Systems  Constitutional: Positive for fatigue. Negative for fever and chills.  HENT: Negative for nosebleeds, congestion, rhinorrhea, sneezing, neck pain and postnasal drip.   Eyes: Negative for visual disturbance.  Respiratory: Negative for apnea, cough, choking, chest tightness, shortness of breath, wheezing and stridor.   Cardiovascular: Negative for chest pain, palpitations and leg swelling.  Gastrointestinal: Positive for constipation.  Genitourinary: Negative for dysuria and urgency.  Musculoskeletal: Negative for arthralgias.  Neurological: Negative for facial asymmetry, light-headedness and headaches.  Hematological: Negative for adenopathy.       Objective:   Physical Exam  Constitutional: She is oriented to person, place, and time. She appears well-developed and well-nourished. No distress.  HENT:  Head: Normocephalic and atraumatic.  Mouth/Throat: No oropharyngeal exudate.  Eyes: Conjunctivae and EOM are normal. Pupils are equal, round, and reactive to light.  Neck: Normal range of motion. Neck supple. No JVD present. No tracheal deviation present. No thyromegaly present.  Cardiovascular: Normal rate, regular rhythm, normal heart sounds and intact distal pulses.  Exam reveals no gallop and no friction rub.   No murmur heard. Pulmonary/Chest: Effort normal and breath sounds normal. No stridor. No respiratory distress. She has no wheezes. She has no rales. She exhibits  no tenderness.  Abdominal: Soft. Bowel sounds are normal. She exhibits no distension and no mass. There is no tenderness. There is no rebound and no guarding.  Musculoskeletal: Normal range of motion. She exhibits no edema and no tenderness.  Lymphadenopathy:    She has no cervical adenopathy.  Neurological: She is alert and oriented to person, place, and time. She has normal reflexes. She displays normal reflexes. No cranial nerve deficit. She exhibits normal muscle tone. Coordination normal.  Skin: Skin is warm. She is not diaphoretic.          Assessment & Plan:

## 2011-06-27 NOTE — Assessment & Plan Note (Addendum)
Her last hemoglobin was 10.9. Recheck CBC, iron and ferritin today. Will also check TSH.

## 2011-06-27 NOTE — Patient Instructions (Signed)
Please take your medicines as prescribed. Please schedule a follow up appointment in 1 week for pelvic exam.

## 2011-06-27 NOTE — Assessment & Plan Note (Signed)
Lab Results  Component Value Date   HGBA1C 5.9 06/27/2011   HGBA1C 6.1 09/19/2010   CREATININE 0.65 01/22/2011   CREATININE 0.67 05/17/2010   MICROALBUR 1.13 09/19/2010   MICRALBCREAT 5.1 09/19/2010   CHOL  Value: 160        ATP III CLASSIFICATION:  <200     mg/dL   Desirable  272-536  mg/dL   Borderline High  >=644    mg/dL   High        0/34/7425   HDL 30* 05/18/2010   TRIG 232* 05/18/2010    Last eye exam and foot exam: No results found for this basename: HMDIABEYEEXA, HMDIABFOOTEX    Assessment: Diabetes control: controlled Progress toward goals: at goal Barriers to meeting goals: no barriers identified  Plan: Diabetes treatment: Will take her off from glipizide. Will continue her on metformin for now. Refer to: none Instruction/counseling given: reminded to bring medications to each visit, discussed foot care, discussed the need for weight loss and discussed diet

## 2011-06-28 LAB — CBC
HCT: 35.2 % — ABNORMAL LOW (ref 36.0–46.0)
Hemoglobin: 10.7 g/dL — ABNORMAL LOW (ref 12.0–15.0)
MCH: 25.1 pg — ABNORMAL LOW (ref 26.0–34.0)
MCHC: 30.4 g/dL (ref 30.0–36.0)
RBC: 4.27 MIL/uL (ref 3.87–5.11)

## 2011-06-28 LAB — FERRITIN: Ferritin: 10 ng/mL (ref 10–291)

## 2011-06-28 LAB — BASIC METABOLIC PANEL WITH GFR
Calcium: 9.7 mg/dL (ref 8.4–10.5)
GFR, Est African American: >60 mL/min (ref >60)
Glucose, Bld: 127 mg/dL — ABNORMAL HIGH (ref 70–99)
Potassium: 4 mEq/L (ref 3.5–5.3)
Sodium: 138 mEq/L (ref 135–145)

## 2011-06-28 LAB — IRON AND TIBC
%SAT: 7 % — ABNORMAL LOW (ref 20–55)
Iron: 26 ug/dL — ABNORMAL LOW (ref 42–145)
UIBC: 322 ug/dL

## 2011-06-30 ENCOUNTER — Telehealth: Payer: Self-pay | Admitting: Internal Medicine

## 2011-06-30 DIAGNOSIS — D509 Iron deficiency anemia, unspecified: Secondary | ICD-10-CM

## 2011-06-30 MED ORDER — FERROUS SULFATE 325 (65 FE) MG PO TBEC: 325.0000 mg | DELAYED_RELEASE_TABLET | Freq: Two times a day (BID) | ORAL | Status: DC

## 2011-06-30 NOTE — Telephone Encounter (Signed)
With her low HB, iron and ferritin- I will start the patient of ferrous sulphate.  Her prescription was sent electronically to the [facility] at [address].

## 2011-06-30 NOTE — Progress Notes (Signed)
Addended by: Elyse Jarvis on: 06/30/2011 10:13 AM   Modules accepted: Orders

## 2011-07-01 LAB — TSH: TSH: 1.074 u[IU]/mL (ref 0.350–4.500)

## 2011-07-04 ENCOUNTER — Ambulatory Visit (INDEPENDENT_AMBULATORY_CARE_PROVIDER_SITE_OTHER): Payer: Self-pay | Admitting: Internal Medicine

## 2011-07-04 VITALS — BP 122/70 | HR 96 | Wt 304.9 lb

## 2011-07-04 DIAGNOSIS — Z0000 Encounter for general adult medical examination without abnormal findings: Secondary | ICD-10-CM

## 2011-07-04 DIAGNOSIS — D509 Iron deficiency anemia, unspecified: Secondary | ICD-10-CM

## 2011-07-04 NOTE — Patient Instructions (Signed)
Please schedule a follow up appointment in 3 months or earlier if needed. Please take your medicines as prescribed.

## 2011-07-04 NOTE — Progress Notes (Signed)
  Subjective:    [name], female    DOB: 09-27-1971, 40 y.o.   MRN: [id number]  HPI: 40 year old woman with past medical history significant for an deficiency anemia, type 2 diabetes mellitus comes to the clinic for a Pap smear.  She denies any complaints at today's visit. She states that she hasn't filled her prescription for iron yet.    Review of Systems  Constitutional: Negative for fever, chills, diaphoresis and fatigue.  HENT: Negative for nosebleeds, congestion, rhinorrhea, sneezing, postnasal drip and tinnitus.   Eyes: Negative for visual disturbance.  Respiratory: Negative for apnea, cough, choking, chest tightness, shortness of breath, wheezing and stridor.   Cardiovascular: Negative for chest pain, palpitations and leg swelling.  Gastrointestinal: Negative for abdominal pain.  Genitourinary: Negative for dysuria, urgency and hematuria.  Neurological: Negative for headaches.  Hematological: Negative for adenopathy.       Objective:   Physical Exam  Constitutional: She is oriented to person, place, and time. She appears well-developed and well-nourished. No distress.  HENT:  Head: Normocephalic and atraumatic.  Mouth/Throat: No oropharyngeal exudate.  Eyes: Conjunctivae and EOM are normal. Pupils are equal, round, and reactive to light. Right eye exhibits no discharge. Left eye exhibits no discharge. No scleral icterus.  Neck: Normal range of motion. Neck supple. No JVD present. No tracheal deviation present. No thyromegaly present.  Cardiovascular: Normal rate, regular rhythm, normal heart sounds and intact distal pulses.  Exam reveals no gallop and no friction rub.   No murmur heard. Pulmonary/Chest: Effort normal and breath sounds normal. No stridor. No respiratory distress. She has no wheezes. She has no rales. She exhibits no tenderness.  Abdominal: Soft. Bowel sounds are normal. She exhibits no distension and no mass. There is no tenderness.  There is no rebound and no guarding.  Genitourinary:       Whitish discharge noticed, abnormal swelling noticed at 11 o'clock position  Lymphadenopathy:    She has no cervical adenopathy.  Neurological: She is alert and oriented to person, place, and time. She has normal reflexes. She displays normal reflexes. No cranial nerve deficit. She exhibits normal muscle tone. Coordination normal.  Skin: She is not diaphoretic.          Assessment & Plan:

## 2011-07-08 DIAGNOSIS — D509 Iron deficiency anemia, unspecified: Secondary | ICD-10-CM | POA: Insufficient documentation

## 2011-07-08 NOTE — Assessment & Plan Note (Signed)
She got a pap smear today. On my exam , I found some abnormal swelling at 11 o'clock. Will defer any GYN referral  until pap smear results are pending. If the results are fine, then I would repeat pelvic exam in 3-4 weeks and would consider the referral at that time if swelling persists.  -She refused pneumococcal shot.

## 2011-07-09 ENCOUNTER — Encounter: Payer: Self-pay | Admitting: Internal Medicine

## 2011-07-09 NOTE — Assessment & Plan Note (Signed)
Given her family history of colon cancer( sister died at age of 53 from colon cancer)- will refer her for colonoscopy. - Continue iron supplementation.

## 2011-08-20 NOTE — Progress Notes (Signed)
Addended by: Neomia Dear on: 08/20/2011 01:37 PM   Modules accepted: Orders

## 2011-09-11 ENCOUNTER — Telehealth: Payer: Self-pay | Admitting: Dietician

## 2011-09-11 NOTE — Telephone Encounter (Signed)
Patient was hesitant to schedule with Diabetes Educator(CDE) on same day as her upcoming doctor appointment. Has had 2 sessions with CDE since diagnosis in 2011.    Orange cards has expired. Advised her to renew as soon as possible. Patient will contact diabetes educator when ready to schedule.

## 2011-09-15 ENCOUNTER — Ambulatory Visit (INDEPENDENT_AMBULATORY_CARE_PROVIDER_SITE_OTHER): Payer: Self-pay | Admitting: Internal Medicine

## 2011-09-15 VITALS — BP 134/83 | HR 95 | Temp 98.1°F | Ht 76.0 in | Wt 307.3 lb

## 2011-09-15 DIAGNOSIS — E119 Type 2 diabetes mellitus without complications: Secondary | ICD-10-CM

## 2011-09-15 DIAGNOSIS — E785 Hyperlipidemia, unspecified: Secondary | ICD-10-CM

## 2011-09-15 DIAGNOSIS — L84 Corns and callosities: Secondary | ICD-10-CM

## 2011-09-15 DIAGNOSIS — R5383 Other fatigue: Secondary | ICD-10-CM

## 2011-09-15 DIAGNOSIS — Z0000 Encounter for general adult medical examination without abnormal findings: Secondary | ICD-10-CM

## 2011-09-15 DIAGNOSIS — D509 Iron deficiency anemia, unspecified: Secondary | ICD-10-CM

## 2011-09-15 DIAGNOSIS — R5381 Other malaise: Secondary | ICD-10-CM

## 2011-09-15 LAB — GLUCOSE, CAPILLARY: Glucose-Capillary: 133 mg/dL — ABNORMAL HIGH (ref 70–99)

## 2011-09-15 MED ORDER — METFORMIN HCL 500 MG PO TABS: 500.0000 mg | ORAL_TABLET | Freq: Two times a day (BID) | ORAL | Status: DC

## 2011-09-15 MED ORDER — GLUCOSE BLOOD VI STRP: ORAL_STRIP | Status: DC

## 2011-09-15 MED ORDER — FERROUS SULFATE 325 (65 FE) MG PO TBEC: 325.0000 mg | DELAYED_RELEASE_TABLET | Freq: Two times a day (BID) | ORAL | Status: DC

## 2011-09-15 MED ORDER — METFORMIN HCL ER 750 MG PO TB24: ORAL_TABLET | ORAL | Status: DC

## 2011-09-15 NOTE — Patient Instructions (Signed)
Please schedule a follow up appointment in 1-2 months. Please get your glucometer along with your next clinic appointment. Please take your medicines as prescribed. Please bring your medication bottles with your next clinic appointment.

## 2011-09-15 NOTE — Progress Notes (Signed)
  Subjective:    [name], female    DOB: 02-10-1971, 40 y.o.   MRN: [id number]  HPI: 40 year old woman with past medical history significant for diabetes, iron deficiency anemia comes to the clinic for followup visit.  She continues to complain of increased fatigue and tiredness which has been persistent for last 2 months. She thinks that this could be related to her metformin as she starts feeling weak and tired immediately after she takes her pill. But she is doubtful at the same time  because she has been taking metformin for long time, perhaps.  She has not been checking her blood sugars are regularly and states that his she has been missing her evening dose of metformin as well. She states that she has heavy menstrual cycle since the age of 17 lasting for about 7 days and has been told in the past she is iron deficient from that.  Denies any systemic complaints including fever, chills, abdominal pain, nausea or vomiting, blood in her stools, alteration in bowel or bladder habits.    Review of Systems  Constitutional: Positive for fatigue. Negative for fever, diaphoresis, activity change and appetite change.  HENT: Negative for nosebleeds, congestion, facial swelling, sneezing, neck pain and postnasal drip.   Eyes: Negative for visual disturbance.  Respiratory: Negative for apnea, cough, choking, chest tightness and shortness of breath.   Cardiovascular: Negative for chest pain, palpitations and leg swelling.  Gastrointestinal: Negative for nausea, vomiting, abdominal pain, diarrhea and constipation.  Genitourinary: Negative for dysuria, urgency and frequency.  Musculoskeletal: Negative for arthralgias.  Neurological: Negative for dizziness, facial asymmetry, light-headedness and headaches.  Hematological: Negative for adenopathy.       Objective:   Physical Exam  Constitutional: She is oriented to person, place, and time. She appears well-developed and  well-nourished. No distress.  HENT:  Head: Normocephalic and atraumatic.  Mouth/Throat: No oropharyngeal exudate.  Eyes: Conjunctivae and EOM are normal. Pupils are equal, round, and reactive to light.  Neck: Normal range of motion. Neck supple. No JVD present. No tracheal deviation present. No thyromegaly present.  Cardiovascular: Normal rate, regular rhythm and intact distal pulses.  Exam reveals no gallop and no friction rub.   No murmur heard. Pulmonary/Chest: Effort normal and breath sounds normal. No stridor.  Abdominal: Soft. Bowel sounds are normal. She exhibits no distension and no mass. There is no tenderness. There is no rebound and no guarding.  Genitourinary:       Abnormal swelling felt diffusely all around her cervix, ? Fibroids, no abnormal discharge noticed.  Musculoskeletal: Normal range of motion. She exhibits no edema and no tenderness.  Lymphadenopathy:    She has no cervical adenopathy.  Neurological: She is alert and oriented to person, place, and time. She has normal reflexes. She displays normal reflexes. No cranial nerve deficit. Coordination normal.  Skin: Skin is warm. She is not diaphoretic.          Assessment & Plan:

## 2011-09-19 NOTE — Assessment & Plan Note (Signed)
Lab Results  Component Value Date   HGBA1C 7.4 09/15/2011   HGBA1C 6.1 09/19/2010   CREATININE 0.76 06/27/2011   CREATININE 0.67 05/17/2010   MICROALBUR 1.13 09/19/2010   MICRALBCREAT 5.1 09/19/2010   CHOL  Value: 160        ATP III CLASSIFICATION:  <200     mg/dL   Desirable  578-469  mg/dL   Borderline High  >=629    mg/dL   High        03/31/4131   HDL 30* 05/18/2010   TRIG 232* 05/18/2010    Last eye exam and foot exam: No results found for this basename: HMDIABEYEEXA, HMDIABFOOTEX    Assessment: She states that she forgets to take her evening dose for metformin. Her AIC has deteriorated from 5.9 to 7.4. Diabetes control: not controlled Progress toward goals: deteriorated Barriers to meeting goals: nonadherence to medications  Plan: Diabetes treatment: In the setting of her missing the evening dose for her  metformin- Would change her to extended release 750 mg ( x3 tabs) for a total dose close to 2000 mg that she could take in the morning. Refer to: diabetes educator for self-management training and diabetes educator for medical nutrition therapy Instruction/counseling given: reminded to bring blood glucose meter & log to each visit, discussed the need for weight loss and discussed diet

## 2011-09-19 NOTE — Assessment & Plan Note (Signed)
On repeat pelvic exam today, I continue to feel abnormal swelling/mass in the endocervix. Her case was discussed with Dr. Coralee Pesa , who also examined the patient with me . We think that, this could be fibroids ( also given her history of menorrhagia).  - Would get a transvaginal ultrasound. - Encouraged her to continue taking iron supplements.

## 2011-09-19 NOTE — Assessment & Plan Note (Signed)
She refused flu and pneumonia shot.

## 2011-09-19 NOTE — Assessment & Plan Note (Signed)
She continues to complain of increased fatigue and tiredness that she thought could be from metformin. She was educated that this is not a known side effect from metformin. I think this is most likely from iron deficiency anemia. Her TSH was checked with last visit that was WNL. - She was encouraged to continue taking iron tablets. - Will schedule her for colonoscopy as soon as she gets orange card, given a family history of colon cancer. - Also schedule her for transvaginal ultrasound to rule out fibroids when she gets orange card.

## 2011-09-22 ENCOUNTER — Other Ambulatory Visit: Payer: Self-pay

## 2011-10-03 ENCOUNTER — Emergency Department (INDEPENDENT_AMBULATORY_CARE_PROVIDER_SITE_OTHER)
Admission: EM | Admit: 2011-10-03 | Discharge: 2011-10-03 | Disposition: A | Discharge status: Home / Self Care | Payer: Self-pay | Source: Home / Self Care | Attending: Family Medicine | Admitting: Family Medicine

## 2011-10-03 ENCOUNTER — Encounter (HOSPITAL_COMMUNITY): Payer: Self-pay | Admitting: *Deleted

## 2011-10-03 DIAGNOSIS — J069 Acute upper respiratory infection, unspecified: Secondary | ICD-10-CM

## 2011-10-03 MED ORDER — IBUPROFEN 800 MG PO TABS
ORAL_TABLET | ORAL | Status: AC
  Filled 2011-10-03: qty 1

## 2011-10-03 MED ORDER — AZITHROMYCIN 250 MG PO TABS: 250.0000 mg | ORAL_TABLET | Freq: Every day | ORAL | Status: AC

## 2011-10-03 MED ORDER — IBUPROFEN 800 MG PO TABS
800.0000 mg | ORAL_TABLET | Freq: Once | ORAL | Status: AC
  Administered 2011-10-03: 800 mg via ORAL

## 2011-10-03 MED ORDER — DM-GUAIFENESIN ER 60-1200 MG PO TB12: 1.0000 | ORAL_TABLET | Freq: Two times a day (BID) | ORAL | Status: AC

## 2011-10-03 NOTE — ED Provider Notes (Signed)
History     CSN: [id number] Arrival date & time: 10/03/2011  7:04 PM   First MD Initiated Contact with Patient 10/03/11 1751      Chief Complaint  Patient presents with  . Generalized Body Aches  . Cough    (Consider location/radiation/quality/duration/timing/severity/associated sxs/prior treatment) Patient is a 40 y.o. female presenting with cough. The history is provided by the patient.  Cough This is a new problem. The current episode started 2 days ago. The problem has not changed since onset.The cough is non-productive. The maximum temperature recorded prior to her arrival was 100 to 100.9 F. Associated symptoms include chills, ear pain and headaches. Pertinent negatives include no wheezing. Associated symptoms comments: Has had backaches and chills. Thinks she has had fever. Also has noted chest pain with coughing. .    Past Medical History  Diagnosis Date  . Diabetes mellitus     type II    Past Surgical History  Procedure Date  . Foot arthrodesis, modified mcbride     right foot bunion surgery and ankle surgery 1980s  . 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 Diabetes Mother   . Diabetes Father   . Diabetes Brother     History  Substance Use Topics  . Smoking status: Current Everyday Smoker -- 0.5 packs/day  . Smokeless tobacco: Not on file  . Alcohol Use: Not on file    OB History    Grav Para Term Preterm Abortions TAB SAB Ect Mult Living                  Review of Systems  Constitutional: Positive for fever and chills.  HENT: Positive for ear pain and postnasal drip.   Respiratory: Positive for cough. Negative for wheezing.   Cardiovascular: Negative.   Gastrointestinal: Negative.   Genitourinary: Negative.   Musculoskeletal: Positive for back pain.  Neurological: Positive for headaches.    Allergies  Codeine  Home Medications   Current Outpatient Rx  Name  Route Sig Dispense Refill  . FERROUS SULFATE 325 (65 FE) MG PO TBEC Oral Take 1 tablet (325 mg total) by mouth 2 (two) times daily. 60 tablet 3  . GLUCOSE BLOOD VI STRP  Use as instructed 100 each 5  . METFORMIN HCL ER 750 MG PO TB24  Take 3 tabs by mouth daily with breakfast. 90 tablet 1  . MULTI-VITAMIN/MINERALS PO TABS Oral Take 1 tablet by mouth daily.      . AZITHROMYCIN 250 MG PO TABS Oral Take 1 tablet (250 mg total) by mouth daily. Take first 2 tablets together, then 1 every day until finished. 6 tablet 0  . DM-GUAIFENESIN ER 60-1200 MG PO TB12 Oral Take 1 tablet by mouth every 12 (twelve) hours. 20 tablet 0    BP 147/78  Pulse 106  Temp(Src) 99.7 F (37.6 C) (Oral)  Resp 20  SpO2 98%  LMP 09/22/2011  Physical Exam  Constitutional: She appears well-developed and well-nourished.  HENT:  Head: Normocephalic and atraumatic.  Right Ear: External ear normal.  Left Ear: External ear normal.  Mouth/Throat: Oropharynx is clear and moist.  Neck: Normal range of motion. Neck supple.  Cardiovascular: Normal rate, regular rhythm and normal heart sounds.   Pulmonary/Chest: Effort normal and breath sounds normal. She has no wheezes.  Lymphadenopathy:    She has no cervical adenopathy.  Skin: Skin is warm  and dry.    ED Course  Procedures (including critical care time)  Labs Reviewed - No data to display No results found.   1. URI (upper respiratory infection)       MDM          Randa Spike, MD 10/03/11 [phone]

## 2011-10-03 NOTE — ED Notes (Signed)
Pt teary-eyed.  C/O generalized body aches, HA, right earache, chest and back pain x 2 days w/ chills and productive cough.  Has been taking OTC Diabetic Tussin.  Had Tyl at 1100 today.

## 2011-10-03 NOTE — ED Notes (Signed)
Crackers & PB provided.

## 2011-10-17 ENCOUNTER — Telehealth: Payer: Self-pay | Admitting: *Deleted

## 2011-10-17 MED ORDER — METFORMIN HCL 1000 MG PO TABS: 1000.0000 mg | ORAL_TABLET | Freq: Two times a day (BID) | ORAL | Status: DC

## 2011-10-17 NOTE — Telephone Encounter (Signed)
I called the patient and she states that she would like to go back to her previous regimen of Metformin 1000 mg BID.  I called  the new prescription at her pharmacy- [facility] [address]

## 2011-10-17 NOTE — Telephone Encounter (Signed)
Pt calls and states she cannot afford the metformin 750 xl, it is $86.00 at [facility], she would like to go back to the metformin she was using before as it is only $4.00, she states she has changed her habits and now she takes it before going to bed and has been doing much better.

## 2011-11-05 NOTE — Progress Notes (Signed)
Addended by: Neomia Dear on: 11/05/2011 02:11 PM   Modules accepted: Orders

## 2011-12-05 ENCOUNTER — Encounter: Payer: Self-pay | Admitting: Internal Medicine

## 2011-12-05 ENCOUNTER — Ambulatory Visit (INDEPENDENT_AMBULATORY_CARE_PROVIDER_SITE_OTHER): Payer: Self-pay | Admitting: Internal Medicine

## 2011-12-05 VITALS — BP 130/80 | HR 80 | Temp 97.6°F | Ht 76.0 in | Wt 300.6 lb

## 2011-12-05 DIAGNOSIS — L84 Corns and callosities: Secondary | ICD-10-CM

## 2011-12-05 DIAGNOSIS — G4762 Sleep related leg cramps: Secondary | ICD-10-CM | POA: Insufficient documentation

## 2011-12-05 DIAGNOSIS — D509 Iron deficiency anemia, unspecified: Secondary | ICD-10-CM

## 2011-12-05 DIAGNOSIS — E119 Type 2 diabetes mellitus without complications: Secondary | ICD-10-CM

## 2011-12-05 LAB — POCT GLYCOSYLATED HEMOGLOBIN (HGB A1C): Hemoglobin A1C: 6.9

## 2011-12-05 LAB — GLUCOSE, CAPILLARY: Glucose-Capillary: 158 mg/dL — ABNORMAL HIGH (ref 70–99)

## 2011-12-05 MED ORDER — METFORMIN HCL 1000 MG PO TABS: 1000.0000 mg | ORAL_TABLET | Freq: Two times a day (BID) | ORAL | Status: DC

## 2011-12-05 NOTE — Assessment & Plan Note (Signed)
Patient states that she has that problem for a while but in the interim it got better and is more worse recently. Etiology could be related to diabetes versus etiopathic versus electrolyte disturbances. She states that she advised to eat bananas in the past but she doesn't like that. She was advised to drink Gatorade and other vitamin- drinks - Check CMP to rule out any reversible cause.

## 2011-12-05 NOTE — Patient Instructions (Signed)
Please schedule a follow up appointment in 3 months. Please schedule an appointment with Jaynee Eagles for orange card. Please buy your glucometer and check your blood sugars regularly. I will call you if your labs will be abnormal.

## 2011-12-05 NOTE — Assessment & Plan Note (Signed)
Colonoscopy and transvaginal ultrasound are pending until she gets an orange card.

## 2011-12-05 NOTE — Assessment & Plan Note (Signed)
Lab Results  Component Value Date   HGBA1C 6.9 12/05/2011   HGBA1C 6.1 09/19/2010   CREATININE 0.76 06/27/2011   CREATININE 0.67 05/17/2010   MICROALBUR 1.13 09/19/2010   MICRALBCREAT 5.1 09/19/2010   CHOL  Value: 160        ATP III CLASSIFICATION:  <200     mg/dL   Desirable  562-130  mg/dL   Borderline High  >=865    mg/dL   High        7/84/6962   HDL 30* 05/18/2010   TRIG 232* 05/18/2010    Last eye exam and foot exam: Foot exam was done with today's visit.  Assessment: Diabetes control: controlled Progress toward goals: improved Barriers to meeting goals: no barriers identified  Plan: Diabetes treatment: continue current medications Refer to: none Instruction/counseling given: reminded to get eye exam, reminded to bring blood glucose meter & log to each visit, reminded to bring medications to each visit, discussed foot care, discussed the need for weight loss and discussed diet

## 2011-12-05 NOTE — Progress Notes (Signed)
Subjective:    [name], female    DOB: August 15, 1971, 41 y.o.   MRN: [id number]  HPI: 41 y/o woman with PMH significant for diabetes, HTN , iron- deficiency anemia comes to the clinic for a follow up visit.  She reports the return of the left heel pain for last few months. For her left heel pain related to the calluses about a year ago,  she was seen at the sports medicine clinic by Dr. Jennette Kettle when she was given some orthotics and creams. She states that she has noticed increased blackening and thickening of the skin over the left heel  for the last few days. Her heel pain is also worse recently , standing for prolonged period of times at work aggravates the pain.  She also complains of some leg cramps that bothers her most at the night that she describes as charlie horses. She states that she has had similar problems in the past and she was advised to drink activity which he doesn't like that.  Of note the patient is due for transvaginal ultrasound and colonoscopy  for her iron deficiency anemia but she doesn't have orange card yet. She  states that she is very busy taking care of her mother and therefore did not get time to get her orange card.  For her diabetes, she does not have her own meter , but she checks her blood sugars occasionally with her mother's meter and they usually run in 80's and 90's.    Review of Systems  Constitutional: Negative for appetite change and fatigue.  HENT: Negative for nosebleeds, congestion and postnasal drip.   Eyes: Negative for photophobia and visual disturbance.  Respiratory: Negative for apnea, cough, choking and chest tightness.   Cardiovascular: Negative for chest pain, palpitations and leg swelling.  Gastrointestinal: Negative for nausea, vomiting, abdominal pain, diarrhea, constipation and blood in stool.  Genitourinary: Negative for dysuria and difficulty urinating.  Musculoskeletal: Negative for arthralgias.  Neurological:  Negative for dizziness, seizures, light-headedness, numbness and headaches.  Hematological: Negative for adenopathy.  Psychiatric/Behavioral: Negative for agitation.       Objective:   Physical Exam  Constitutional: She is oriented to person, place, and time. She appears well-developed and well-nourished. No distress.  HENT:  Head: Normocephalic and atraumatic.  Mouth/Throat: No oropharyngeal exudate.  Eyes: Conjunctivae and EOM are normal. Pupils are equal, round, and reactive to light. Right eye exhibits no discharge. Left eye exhibits no discharge.  Neck: Normal range of motion. Neck supple. No JVD present. No tracheal deviation present. No thyromegaly present.  Cardiovascular: Normal rate, regular rhythm, normal heart sounds and intact distal pulses.  Exam reveals no gallop and no friction rub.   No murmur heard. Pulmonary/Chest: Effort normal and breath sounds normal. No stridor. No respiratory distress. She has no wheezes. She has no rales. She exhibits no tenderness.  Abdominal: Soft. Bowel sounds are normal. She exhibits no distension and no mass. There is no tenderness. There is no rebound and no guarding.  Musculoskeletal:       Left heel thickened and black skin consistent with callus formations, also noticed to have some healing warts on the underside of the forefoot region , left heel is tender to palpation. Right heel also appears to have some callus but left is worse.  Lymphadenopathy:    She has no cervical adenopathy.  Neurological: She is alert and oriented to person, place, and time. She displays normal reflexes. No cranial nerve deficit. She  exhibits normal muscle tone. Coordination normal.  Skin: She is not diaphoretic.          Assessment & Plan:

## 2011-12-05 NOTE — Assessment & Plan Note (Signed)
She reports worsening heel pain at today's visit. Encouraged her to expedite her processing golf range card so that we can make sports medicine referral for further evaluation.. Given her diabetes she was educated on foot hygiene and and  be very vigilant with any foot injury or infection.

## 2011-12-06 LAB — COMPLETE METABOLIC PANEL WITH GFR
ALT: 29 U/L (ref 0–35)
AST: 17 U/L (ref 0–37)
Albumin: 4.4 g/dL (ref 3.5–5.2)
Alkaline Phosphatase: 54 U/L (ref 39–117)
Calcium: 9.3 mg/dL (ref 8.4–10.5)
Chloride: 105 mEq/L (ref 96–112)
Creat: 0.8 mg/dL (ref 0.50–1.10)
GFR, Est Non African American: >89 mL/min
Potassium: 4 mEq/L (ref 3.5–5.3)

## 2012-02-25 ENCOUNTER — Encounter (HOSPITAL_COMMUNITY): Payer: Self-pay

## 2012-02-25 ENCOUNTER — Emergency Department (INDEPENDENT_AMBULATORY_CARE_PROVIDER_SITE_OTHER)
Admission: EM | Admit: 2012-02-25 | Discharge: 2012-02-25 | Disposition: A | Discharge status: Home / Self Care | Payer: Self-pay | Source: Home / Self Care | Attending: Emergency Medicine | Admitting: Emergency Medicine

## 2012-02-25 DIAGNOSIS — S39012A Strain of muscle, fascia and tendon of lower back, initial encounter: Secondary | ICD-10-CM

## 2012-02-25 DIAGNOSIS — S335XXA Sprain of ligaments of lumbar spine, initial encounter: Secondary | ICD-10-CM

## 2012-02-25 HISTORY — DX: Obesity, unspecified: E66.9

## 2012-02-25 HISTORY — DX: Iron deficiency anemia, unspecified: D50.9

## 2012-02-25 HISTORY — DX: Unspecified fracture of left acetabulum, initial encounter for closed fracture: S32.402A

## 2012-02-25 HISTORY — DX: Strain of muscle, fascia and tendon of lower back, initial encounter: S39.012A

## 2012-02-25 LAB — POCT URINALYSIS DIP (DEVICE)
Bilirubin Urine: NEGATIVE
Ketones, ur: NEGATIVE mg/dL
Specific Gravity, Urine: 1.02 (ref 1.005–1.030)
pH: 5.5 (ref 5.0–8.0)

## 2012-02-25 MED ORDER — PREDNISONE 20 MG PO TABS: ORAL_TABLET | ORAL | Status: AC

## 2012-02-25 MED ORDER — HYDROCODONE-ACETAMINOPHEN 5-325 MG PO TABS: 2.0000 | ORAL_TABLET | ORAL | Status: AC | PRN

## 2012-02-25 MED ORDER — CYCLOBENZAPRINE HCL 10 MG PO TABS: 10.0000 mg | ORAL_TABLET | Freq: Three times a day (TID) | ORAL | Status: AC | PRN

## 2012-02-25 MED ORDER — MELOXICAM 15 MG PO TABS: 15.0000 mg | ORAL_TABLET | Freq: Every day | ORAL | Status: DC

## 2012-02-25 NOTE — Discharge Instructions (Signed)
Take the medication as written. Take 1 gram of tylenol with the motrin up to 4 times a day as needed for pain and fever. This is an effective combination for pain. Take the hydrocodone/norco only for severe pain. Do not take the tylenol and hydrocodone/norcoas they both have tylenol in them and too much can hurt your liver. Return if you get worse, have a  fever >100.4, or for any concerns.   Go to www.goodrx.com to look up your medications. This will give you a list of where you can find your prescriptions at the most affordable prices.   

## 2012-02-25 NOTE — ED Notes (Signed)
States she woke yest AM w pain in lower back; no known trauma; pain sometimes goes across lower back and into her legs like a burning, tingling sensation
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
## 2012-02-25 NOTE — ED Notes (Signed)
C/o her UA smells funny

## 2012-07-19 ENCOUNTER — Ambulatory Visit (INDEPENDENT_AMBULATORY_CARE_PROVIDER_SITE_OTHER): Payer: Self-pay | Admitting: Internal Medicine

## 2012-07-19 ENCOUNTER — Encounter: Payer: Self-pay | Admitting: Internal Medicine

## 2012-07-19 VITALS — BP 140/90 | HR 80 | Temp 98.4°F | Ht 76.0 in | Wt 286.5 lb

## 2012-07-19 DIAGNOSIS — D509 Iron deficiency anemia, unspecified: Secondary | ICD-10-CM

## 2012-07-19 DIAGNOSIS — I10 Essential (primary) hypertension: Secondary | ICD-10-CM

## 2012-07-19 DIAGNOSIS — L84 Corns and callosities: Secondary | ICD-10-CM

## 2012-07-19 DIAGNOSIS — E785 Hyperlipidemia, unspecified: Secondary | ICD-10-CM

## 2012-07-19 DIAGNOSIS — Z79899 Other long term (current) drug therapy: Secondary | ICD-10-CM

## 2012-07-19 DIAGNOSIS — Z113 Encounter for screening for infections with a predominantly sexual mode of transmission: Secondary | ICD-10-CM

## 2012-07-19 DIAGNOSIS — E119 Type 2 diabetes mellitus without complications: Secondary | ICD-10-CM

## 2012-07-19 LAB — COMPREHENSIVE METABOLIC PANEL
ALT: 12 U/L (ref 0–35)
Alkaline Phosphatase: 49 U/L (ref 39–117)
Creat: 0.77 mg/dL (ref 0.50–1.10)
Glucose, Bld: 104 mg/dL — ABNORMAL HIGH (ref 70–99)
Sodium: 139 mEq/L (ref 135–145)
Total Bilirubin: 0.2 mg/dL — ABNORMAL LOW (ref 0.3–1.2)
Total Protein: 7.4 g/dL (ref 6.0–8.3)

## 2012-07-19 LAB — CBC
MCH: 25.6 pg — ABNORMAL LOW (ref 26.0–34.0)
MCHC: 32.7 g/dL (ref 30.0–36.0)
MCV: 78.2 fL (ref 78.0–100.0)
Platelets: 484 10*3/uL — ABNORMAL HIGH (ref 150–400)

## 2012-07-19 LAB — LIPID PANEL
LDL Cholesterol: 146 mg/dL — ABNORMAL HIGH (ref 0–99)
Total CHOL/HDL Ratio: 6.6 Ratio
Triglycerides: 222 mg/dL — ABNORMAL HIGH (ref <150)
VLDL: 44 mg/dL — ABNORMAL HIGH (ref 0–40)

## 2012-07-19 LAB — GLUCOSE, CAPILLARY: Glucose-Capillary: 92 mg/dL (ref 70–99)

## 2012-07-19 MED ORDER — METFORMIN HCL 1000 MG PO TABS: 1000.0000 mg | ORAL_TABLET | Freq: Two times a day (BID) | ORAL | Status: DC

## 2012-07-19 MED ORDER — ENALAPRIL MALEATE 5 MG PO TABS: 5.0000 mg | ORAL_TABLET | Freq: Every day | ORAL | Status: DC

## 2012-07-19 NOTE — Progress Notes (Signed)
  Subjective:    [name], female    DOB: [birth year], 41 y.o.   MRN: [id number]  HPI 41 year old woman with past medical history significant for diabetes, hypertension, iron deficiency anemia comes to the clinic for routine followup visit almost after 7-8 months.  She just  complains of off and  on headaches . Gets better with Tylenol.  She has her orange card now and we can initiate work up for her iron deficiency anemia. Denies any blood in her stools, abdominal pain, weight loss. Continue to report feeling tired after work and heavy menstrual cycle. .   Review of Systems  Constitutional: Positive for fatigue. Negative for fever, activity change and appetite change.  HENT: Negative for hearing loss, congestion, rhinorrhea and postnasal drip.   Eyes: Negative for visual disturbance.  Respiratory: Negative for apnea, cough, choking, chest tightness and shortness of breath.   Cardiovascular: Negative for chest pain, palpitations and leg swelling.  Gastrointestinal: Negative for abdominal pain, diarrhea and constipation.  Genitourinary: Negative for dysuria, urgency, frequency and flank pain.  Musculoskeletal: Negative for arthralgias.  Neurological: Negative for dizziness, facial asymmetry, weakness and light-headedness.  Hematological: Negative for adenopathy.  Psychiatric/Behavioral: Negative for agitation.       Objective:   Physical Exam  Constitutional: She is oriented to person, place, and time. She appears well-developed and well-nourished. No distress.  HENT:  Head: Normocephalic and atraumatic.  Mouth/Throat: No oropharyngeal exudate.  Eyes: Conjunctivae normal and EOM are normal. Pupils are equal, round, and reactive to light.  Neck: Normal range of motion. Neck supple. No JVD present. No tracheal deviation present. No thyromegaly present.  Cardiovascular: Normal rate, regular rhythm and normal heart sounds.  Exam reveals no gallop and no friction rub.    No murmur heard. Pulmonary/Chest: Effort normal and breath sounds normal. No stridor. No respiratory distress. She has no wheezes. She has no rales.  Abdominal: Soft. Bowel sounds are normal. She exhibits no distension. There is no tenderness. There is no rebound.  Musculoskeletal: Normal range of motion. She exhibits no edema and no tenderness.  Neurological: She is alert and oriented to person, place, and time. She has normal reflexes. No cranial nerve deficit. Coordination normal.  Skin: Skin is warm. She is not diaphoretic.          Assessment & Plan:

## 2012-07-19 NOTE — Patient Instructions (Addendum)
Please schedule a follow up appointment in 1-2 months . Please bring your medication bottles with your next appointment. Please take your medicines as prescribed. I will call you with your lab results if anything will be abnormal.

## 2012-07-20 DIAGNOSIS — Z113 Encounter for screening for infections with a predominantly sexual mode of transmission: Secondary | ICD-10-CM | POA: Insufficient documentation

## 2012-07-20 DIAGNOSIS — I10 Essential (primary) hypertension: Secondary | ICD-10-CM | POA: Insufficient documentation

## 2012-07-20 LAB — HEPATITIS PANEL, ACUTE
Hep A IgM: NEGATIVE
Hep B C IgM: NEGATIVE

## 2012-07-20 LAB — HIV ANTIBODY (ROUTINE TESTING W REFLEX): HIV: NONREACTIVE

## 2012-07-20 MED ORDER — PRAVASTATIN SODIUM 40 MG PO TABS: 40.0000 mg | ORAL_TABLET | Freq: Every evening | ORAL | Status: DC

## 2012-07-20 NOTE — Assessment & Plan Note (Signed)
Lab Results  Component Value Date   HGBA1C 6.1 07/19/2012   HGBA1C 6.1 09/19/2010   CREATININE 0.77 07/19/2012   CREATININE 0.67 05/17/2010   MICROALBUR 1.13 09/19/2010   MICRALBCREAT 5.1 09/19/2010   CHOL 224* 07/19/2012   HDL 34* 07/19/2012   TRIG 222* 07/19/2012    Last eye exam and foot exam: No results found for this basename: HMDIABEYEEXA, HMDIABFOOTEX    Assessment: Congratulated her on her excellent blood sugar control! Diabetes control: controlled Progress toward goals: at goal Barriers to meeting goals: no barriers identified  Plan: Diabetes treatment: continue current medications Refer to: diabetes educator for self-management training, diabetes educator for medical nutrition therapy and nutritionist Instruction/counseling given: reminded to get eye exam, reminded to bring blood glucose meter & log to each visit, reminded to bring medications to each visit, discussed foot care, discussed the need for weight loss and discussed diet

## 2012-07-20 NOTE — Assessment & Plan Note (Signed)
I decided to start her on low dose lisinopril to get her BP ~130/80 , given her DM and family h/o HTN and persistently elevated BP( above goal) for last few visits.  Her last micro/Cr ratio was 2 years ago and was WNL. Check microalbumin /Cr ratio with next visit for prognostication mainly,  as she was started on lisinopril today.

## 2012-07-20 NOTE — Assessment & Plan Note (Signed)
Check lipid panel and CMP.   Lipid Panel     Component Value Date/Time   CHOL 224* 07/19/2012 1619   TRIG 222* 07/19/2012 1619   HDL 34* 07/19/2012 1619   CHOLHDL 6.6 07/19/2012 1619   VLDL 44* 07/19/2012 1619   LDLCALC 146* 07/19/2012 1619     Update- Her LDL was elevated to 146 and HDL is low at 34, her Framingham risk score for 10 years >20%, considering DM as CHD equivalent. Would start her on statin.

## 2012-07-20 NOTE — Assessment & Plan Note (Signed)
Patient has acquired an orange card.  -Refer her to GI for colonoscopy ( sister dies at 38 from colon cancer) -Schedule her for transvaginal USG to rule out fibroids as cause of heavy menstrual cycle and in turn iron deficiency anemia.

## 2012-07-20 NOTE — Assessment & Plan Note (Signed)
Stable.  No signs of infection on exam. She wears extras support in shoes- encouraged to continue wearing that. She was advised to follow up with podiatrist.

## 2012-07-20 NOTE — Assessment & Plan Note (Signed)
Check HIV and hepatitis as per patient's request.

## 2012-07-22 ENCOUNTER — Telehealth: Payer: Self-pay | Admitting: Internal Medicine

## 2012-07-22 NOTE — Telephone Encounter (Signed)
I tried calling the patient regarding her lab results but she did not pick up. Would try again.

## 2012-07-26 NOTE — Telephone Encounter (Signed)
I tried calling her again on the listed phone number but she was not available>>left message.  Tina [name]

## 2012-07-28 ENCOUNTER — Ambulatory Visit (HOSPITAL_COMMUNITY)
Admission: RE | Admit: 2012-07-28 | Discharge: 2012-07-28 | Disposition: A | Discharge status: Home / Self Care | Payer: Self-pay | Source: Ambulatory Visit | Attending: Internal Medicine | Admitting: Internal Medicine

## 2012-07-28 ENCOUNTER — Encounter: Payer: Self-pay | Admitting: Gastroenterology

## 2012-07-28 ENCOUNTER — Other Ambulatory Visit: Payer: Self-pay | Admitting: Internal Medicine

## 2012-07-28 DIAGNOSIS — N852 Hypertrophy of uterus: Secondary | ICD-10-CM | POA: Insufficient documentation

## 2012-07-28 DIAGNOSIS — D509 Iron deficiency anemia, unspecified: Secondary | ICD-10-CM | POA: Insufficient documentation

## 2012-07-28 DIAGNOSIS — D259 Leiomyoma of uterus, unspecified: Secondary | ICD-10-CM | POA: Insufficient documentation

## 2012-08-24 ENCOUNTER — Ambulatory Visit (INDEPENDENT_AMBULATORY_CARE_PROVIDER_SITE_OTHER): Payer: Self-pay | Admitting: Gastroenterology

## 2012-08-24 ENCOUNTER — Encounter: Payer: Self-pay | Admitting: Gastroenterology

## 2012-08-24 VITALS — BP 134/74 | HR 76 | Ht 73.5 in | Wt 289.0 lb

## 2012-08-24 DIAGNOSIS — D509 Iron deficiency anemia, unspecified: Secondary | ICD-10-CM

## 2012-08-24 DIAGNOSIS — Z800 Family history of malignant neoplasm of digestive organs: Secondary | ICD-10-CM

## 2012-08-24 MED ORDER — PEG-KCL-NACL-NASULF-NA ASC-C 100 G PO SOLR: 1.0000 | Freq: Once | ORAL | Status: DC

## 2012-08-24 NOTE — Progress Notes (Signed)
History of Present Illness: This is a 41 year old female with iron deficiency anemia diagnosed in August 2012. She has chronic constipation and takes milk of magnesia and magnesium citrate on a regular basis. Her sister died of colon cancer at age 72. Denies weight loss, abdominal pain, diarrhea, change in stool caliber, melena, hematochezia, nausea, vomiting, dysphagia, reflux symptoms, chest pain.  Review of Systems: Pertinent positive and negative review of systems were noted in the above HPI section. All other review of systems were otherwise negative.  Current Medications, Allergies, Past Medical History, Past Surgical History, Family History and Social History were reviewed in [employer] record.  Physical Exam: General: Well developed , well nourished, no acute distress Head: Normocephalic and atraumatic Eyes:  sclerae anicteric, EOMI Ears: Normal auditory acuity Mouth: No deformity or lesions Neck: Supple, no masses or thyromegaly Lungs: Clear throughout to auscultation Heart: Regular rate and rhythm; no murmurs, rubs or bruits Abdomen: Soft, non tender and non distended. No masses, hepatosplenomegaly or hernias noted. Normal Bowel sounds Rectal: deferred to colonoscopy Musculoskeletal: Symmetrical with no gross deformities  Skin: No lesions on visible extremities Pulses:  Normal pulses noted Extremities: No clubbing, cyanosis, edema or deformities noted Neurological: Alert oriented x 4, grossly nonfocal Cervical Nodes:  No significant cervical adenopathy Inguinal Nodes: No significant inguinal adenopathy Psychological:  Alert and cooperative. Normal mood and affect  Assessment and Recommendations:  1. Fe def anemia, sister with colon cancer, chronic constipation. Obtain stool Hemoccults. Schedule colonoscopy with extended bowel prep. Hold iron supplements for 5 days prior to colonoscopy. The risks, benefits, and alternatives to colonoscopy with possible  biopsy and possible polypectomy were discussed with the patient and they consent to proceed.

## 2012-08-24 NOTE — Patient Instructions (Addendum)
You have been given a separate informational sheet regarding your tobacco use, the importance of quitting and local resources to help you quit.  You have been scheduled for a colonoscopy with propofol. Please follow written instructions given to you at your visit today.  Please pick up your prep kit at the pharmacy within the next 1-3 days. If you use inhalers (even only as needed), please bring them with you on the day of your procedure.  Follow instructions on Hemoccult cards and mail them back to [place] when finished.   cc: Elyse Jarvis, MD

## 2012-10-06 ENCOUNTER — Telehealth: Payer: Self-pay | Admitting: Dietician

## 2012-10-06 ENCOUNTER — Encounter: Payer: Self-pay | Admitting: Gastroenterology

## 2012-10-11 NOTE — Telephone Encounter (Signed)
Left message requesting return call

## 2012-11-12 ENCOUNTER — Other Ambulatory Visit: Payer: Self-pay | Admitting: *Deleted

## 2012-11-12 DIAGNOSIS — I10 Essential (primary) hypertension: Secondary | ICD-10-CM

## 2012-11-12 MED ORDER — ENALAPRIL MALEATE 5 MG PO TABS: 5.0000 mg | ORAL_TABLET | Freq: Every day | ORAL | Status: DC

## 2012-12-09 ENCOUNTER — Encounter: Payer: Self-pay | Admitting: Internal Medicine

## 2012-12-09 ENCOUNTER — Ambulatory Visit (INDEPENDENT_AMBULATORY_CARE_PROVIDER_SITE_OTHER): Payer: PRIVATE HEALTH INSURANCE | Admitting: Internal Medicine

## 2012-12-09 VITALS — BP 122/74 | HR 87 | Temp 98.2°F | Ht 76.0 in | Wt 292.9 lb

## 2012-12-09 DIAGNOSIS — I10 Essential (primary) hypertension: Secondary | ICD-10-CM

## 2012-12-09 DIAGNOSIS — R9389 Abnormal findings on diagnostic imaging of other specified body structures: Secondary | ICD-10-CM

## 2012-12-09 DIAGNOSIS — Z79899 Other long term (current) drug therapy: Secondary | ICD-10-CM

## 2012-12-09 DIAGNOSIS — M25569 Pain in unspecified knee: Secondary | ICD-10-CM

## 2012-12-09 DIAGNOSIS — D509 Iron deficiency anemia, unspecified: Secondary | ICD-10-CM

## 2012-12-09 DIAGNOSIS — M25561 Pain in right knee: Secondary | ICD-10-CM | POA: Insufficient documentation

## 2012-12-09 DIAGNOSIS — M2140 Flat foot [pes planus] (acquired), unspecified foot: Secondary | ICD-10-CM

## 2012-12-09 DIAGNOSIS — D219 Benign neoplasm of connective and other soft tissue, unspecified: Secondary | ICD-10-CM

## 2012-12-09 DIAGNOSIS — E119 Type 2 diabetes mellitus without complications: Secondary | ICD-10-CM

## 2012-12-09 DIAGNOSIS — E785 Hyperlipidemia, unspecified: Secondary | ICD-10-CM

## 2012-12-09 LAB — COMPLETE METABOLIC PANEL WITH GFR
ALT: 14 U/L (ref 0–35)
Albumin: 4.4 g/dL (ref 3.5–5.2)
Alkaline Phosphatase: 45 U/L (ref 39–117)
CO2: 27 mEq/L (ref 19–32)
Calcium: 9.8 mg/dL (ref 8.4–10.5)
Chloride: 104 mEq/L (ref 96–112)
Creat: 0.67 mg/dL (ref 0.50–1.10)
GFR, Est African American: >89 mL/min
GFR, Est Non African American: >89 mL/min
Glucose, Bld: 88 mg/dL (ref 70–99)
Sodium: 139 mEq/L (ref 135–145)
Total Bilirubin: 0.2 mg/dL — ABNORMAL LOW (ref 0.3–1.2)
Total Protein: 7.5 g/dL (ref 6.0–8.3)

## 2012-12-09 LAB — GLUCOSE, CAPILLARY: Glucose-Capillary: 80 mg/dL (ref 70–99)

## 2012-12-09 LAB — CBC
HCT: 32 % — ABNORMAL LOW (ref 36.0–46.0)
Hemoglobin: 10.5 g/dL — ABNORMAL LOW (ref 12.0–15.0)
MCHC: 32.8 g/dL (ref 30.0–36.0)
RBC: 4.12 MIL/uL (ref 3.87–5.11)
RDW: 16.1 % — ABNORMAL HIGH (ref 11.5–15.5)
WBC: 9.1 10*3/uL (ref 4.0–10.5)

## 2012-12-09 MED ORDER — METFORMIN HCL 1000 MG PO TABS: 500.0000 mg | ORAL_TABLET | Freq: Two times a day (BID) | ORAL | Status: DC

## 2012-12-09 MED ORDER — PRAVASTATIN SODIUM 40 MG PO TABS: 40.0000 mg | ORAL_TABLET | Freq: Every evening | ORAL | Status: DC

## 2012-12-09 NOTE — Assessment & Plan Note (Signed)
Patient presents with right knee pain for one week associated with swelling on the lateral aspect. On exam she was noted to have a swelling on the lateral aspect of the knee and  slightly limited external rotation. Differentials include osteoarthritis versus iliotibial band syndrome. -NSAIDs for pain relief. -Heat or ice to help the pain and inflammation. -call the clinic if it doesn't get better in 1-2 weeks

## 2012-12-09 NOTE — Assessment & Plan Note (Signed)
Congratulated her on excellent control of her diabetes. Patient had some concern with the side effects from metformin. She was reassured that the her good kidney function, metformin is a safe drug for her. -Reduce the dose from 1000 twice a day to 500 twice a day in the setting of very well controlled diabetes. - Foot exam was done today. - Started her on pravachol . She did not start taking the medication get because she was not reachable on phone when the prescription was called based on results. - uptodate on eye exam

## 2012-12-09 NOTE — Assessment & Plan Note (Signed)
Colonoscopy is prudent for this patient given family history of colon cancer in her sister. She was seen at GI office  By Dr. Russella Dar but unfortunately could not get colonoscopy because she could not afford the bowel prep ( costing her 100 $). -Would call to [facility] health department pharmacy to see if patient can get that at cheaper price from there. -Would talk to [facility] to see if patient would qualify for the study to get free colonoscopy.  She was also noted to have thickened endometrium on her transvaginal ultrasound in 09 /2013 with a followup recommended in 6-8 weeks but patient was not reachable. She is scheduled for a followup ultrasound tomorrow.
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
## 2012-12-09 NOTE — Assessment & Plan Note (Signed)
On Foot exam today -patient was noted to have a thick callus between first and second toe. Unfortunately no podiatric is available through [facility] for this month.. -Was advised to apply Eucerin ointment. -Keep the  toes dry -Call the clinic if she starts getting wound with any drainage from the area.

## 2012-12-09 NOTE — Patient Instructions (Addendum)
General Instructions: Please schedule a follow up appointment in 2-3 months or sooner if needed . Please bring your medication bottles with your next appointment. Please take your medicines as prescribed. I will call you with your lab results if anything will be abnormal. Please put heat/ice on the right knee. Please take OTC ibuprofen for the right knee.     Treatment Goals:  Goals (1 Years of Data) as of 12/09/2012          As of Today 07/19/12 12/05/11 05/18/10     Result Component    . HEMOGLOBIN A1C < 6.5  5.8 6.1 6.9     . LDL CALC < 100   146  84       ...      Progress Toward Treatment Goals:  Treatment Goal 12/09/2012  Hemoglobin A1C at goal  Blood pressure at goal  Stop smoking smoking the same amount    Self Care Goals & Plans:  Self Care Goal 12/09/2012  Manage my medications take my medicines as prescribed; bring my medications to every visit; refill my medications on time; follow the sick day instructions if I am sick  Monitor my health keep track of my blood glucose; bring my glucose meter and log to each visit  Eat healthy foods drink diet soda or water instead of juice or soda; eat more vegetables; eat baked foods instead of fried foods  Be physically active find an activity I enjoy  Stop smoking call [facility] (1-800-QUIT-NOW)    Home Blood Glucose Monitoring 12/09/2012  Check my blood sugar 2 times a day  When to check my blood sugar before meals     Care Management & Community Referrals:  Referral 12/09/2012  Referrals made for care management support none needed

## 2012-12-09 NOTE — Assessment & Plan Note (Signed)
Blood Pressure very well controlled. Continue enalapril. Would check microalbumin to creatinine for prognostic reasons.

## 2012-12-10 ENCOUNTER — Telehealth: Payer: Self-pay | Admitting: Internal Medicine

## 2012-12-10 ENCOUNTER — Encounter: Payer: Self-pay | Admitting: Gastroenterology

## 2012-12-10 LAB — MICROALBUMIN / CREATININE URINE RATIO
Creatinine, Urine: 161.8 mg/dL
Microalb Creat Ratio: 11.1 mg/g (ref 0.0–30.0)
Microalb, Ur: 1.8 mg/dL (ref 0.00–1.89)

## 2012-12-10 NOTE — Telephone Encounter (Signed)
I called at the number and patient was not available. Tried again on her cell phone but not reachable. Left message with her momTeandra [name]) finally  about:  Appt on 12/27/12 at 2 PM for getting a coupon  appt on 01/11/13 at 10 Am with Dr. Russella Dar.  Thanks, [employer]

## 2012-12-14 ENCOUNTER — Other Ambulatory Visit: Payer: Self-pay | Admitting: Internal Medicine

## 2012-12-14 ENCOUNTER — Ambulatory Visit (HOSPITAL_COMMUNITY)
Admission: RE | Admit: 2012-12-14 | Discharge: 2012-12-14 | Disposition: A | Discharge status: Home / Self Care | Payer: PRIVATE HEALTH INSURANCE | Source: Ambulatory Visit | Attending: Internal Medicine | Admitting: Internal Medicine

## 2012-12-14 DIAGNOSIS — R9389 Abnormal findings on diagnostic imaging of other specified body structures: Secondary | ICD-10-CM

## 2012-12-14 DIAGNOSIS — N83209 Unspecified ovarian cyst, unspecified side: Secondary | ICD-10-CM | POA: Insufficient documentation

## 2012-12-14 DIAGNOSIS — D252 Subserosal leiomyoma of uterus: Secondary | ICD-10-CM | POA: Insufficient documentation

## 2012-12-15 ENCOUNTER — Telehealth: Payer: Self-pay | Admitting: Internal Medicine

## 2012-12-15 DIAGNOSIS — N83201 Unspecified ovarian cyst, right side: Secondary | ICD-10-CM

## 2012-12-15 NOTE — Telephone Encounter (Signed)
Tried calling the patient to discuss the results of her transvaginal ultrasound that patient was not available. Message left with the mother to ask the patient to return the phone call when she comes back.  I'm extremely worried about this patient given her iron deficiency anemia and family history of colon cancer but still not able to get a colonoscopy though we have been trying that for last 1-2 years. Her transvaginal ultrasound continues to  show thickened endometrium and now a new ovarian cyst. After discussing her case with my attending, Hereditary Non polyposis colorectal cancer/ HNPCC or [name] syndrome is highly, differential. The syndrome is associated with colon cancer and patient's have increased predilection to have association with extracolonic cancers like uterine cancer and 60% of the cases and ovarian cancer in 10-20% of the cases.  I will try calling her again to emphasize not to Miss her colonoscopy this time. Would get repeat transvaginal ultrasound in 6 weeks. Orders placed

## 2012-12-23 ENCOUNTER — Encounter (HOSPITAL_COMMUNITY): Payer: Self-pay | Admitting: Emergency Medicine

## 2012-12-23 ENCOUNTER — Emergency Department (INDEPENDENT_AMBULATORY_CARE_PROVIDER_SITE_OTHER)
Admission: EM | Admit: 2012-12-23 | Discharge: 2012-12-23 | Disposition: A | Discharge status: Home / Self Care | Payer: PRIVATE HEALTH INSURANCE | Source: Home / Self Care

## 2012-12-23 DIAGNOSIS — L299 Pruritus, unspecified: Secondary | ICD-10-CM

## 2012-12-23 DIAGNOSIS — L089 Local infection of the skin and subcutaneous tissue, unspecified: Secondary | ICD-10-CM

## 2012-12-23 DIAGNOSIS — J069 Acute upper respiratory infection, unspecified: Secondary | ICD-10-CM

## 2012-12-23 DIAGNOSIS — B9689 Other specified bacterial agents as the cause of diseases classified elsewhere: Secondary | ICD-10-CM

## 2012-12-23 MED ORDER — TRIAMCINOLONE ACETONIDE 0.1 % EX CREA: TOPICAL_CREAM | CUTANEOUS | Status: DC

## 2012-12-23 MED ORDER — DOXYCYCLINE HYCLATE 100 MG PO CAPS: 100.0000 mg | ORAL_CAPSULE | Freq: Two times a day (BID) | ORAL | Status: DC

## 2012-12-23 MED ORDER — MUPIROCIN CALCIUM 2 % EX CREA: TOPICAL_CREAM | Freq: Three times a day (TID) | CUTANEOUS | Status: DC
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
## 2012-12-23 NOTE — ED Notes (Signed)
Pt c/o severe itching of breast x 2 wks. Boil appear on right breast two to three days ago. Pt has used warm compresses. Drainage of boil x last night. Pt used hydrocortisone cream 1% for itching and cleaned boil with betadine and peroxide. Still very painful and tender.   Pt c/o uri. Sinus pressure and pain. Congestion and productive cough. Diarrhea x several days. otc meds not working.

## 2012-12-24 ENCOUNTER — Telehealth: Payer: Self-pay | Admitting: *Deleted

## 2012-12-24 ENCOUNTER — Ambulatory Visit (INDEPENDENT_AMBULATORY_CARE_PROVIDER_SITE_OTHER): Payer: PRIVATE HEALTH INSURANCE | Admitting: Internal Medicine

## 2012-12-24 VITALS — BP 144/75 | HR 94 | Temp 98.1°F | Ht 76.0 in | Wt 285.2 lb

## 2012-12-24 DIAGNOSIS — J988 Other specified respiratory disorders: Secondary | ICD-10-CM | POA: Insufficient documentation

## 2012-12-24 DIAGNOSIS — N611 Abscess of the breast and nipple: Secondary | ICD-10-CM | POA: Insufficient documentation

## 2012-12-24 DIAGNOSIS — D509 Iron deficiency anemia, unspecified: Secondary | ICD-10-CM

## 2012-12-24 DIAGNOSIS — N83209 Unspecified ovarian cyst, unspecified side: Secondary | ICD-10-CM

## 2012-12-24 DIAGNOSIS — L02239 Carbuncle of trunk, unspecified: Secondary | ICD-10-CM

## 2012-12-24 DIAGNOSIS — E119 Type 2 diabetes mellitus without complications: Secondary | ICD-10-CM

## 2012-12-24 LAB — GLUCOSE, CAPILLARY: Glucose-Capillary: 107 mg/dL — ABNORMAL HIGH (ref 70–99)

## 2012-12-24 MED ORDER — LORATADINE 10 MG PO TABS: 10.0000 mg | ORAL_TABLET | Freq: Every day | ORAL | Status: DC

## 2012-12-24 NOTE — Assessment & Plan Note (Signed)
She reports having symptoms consistent with URI for last 5 days comprising of active cough with whitish phlegm, rhinorrhea, postnasal drip . Denies any fever, chills, shortness of breath or sore throat. - Claritin - Drink plenty of fluids - Doxycycline for breast boil would also cover for her URI - Call the clinic if she does not improve in 1 wwek - Steam inhalation

## 2012-12-24 NOTE — Patient Instructions (Addendum)
General Instructions: Please schedule a follow up appointment in 2 months . Please bring your medication bottles with your next appointment. Please take your medicines as prescribed. Please keep up with your GYN appt.  Please call the clinic if cough , congestion and boil in the breast does not get better in 1 week.     Treatment Goals:  Goals (1 Years of Data) as of 12/24/12         12/09/12 07/19/12 12/05/11 05/18/10     Result Component    . HEMOGLOBIN A1C < 6.5  5.8 6.1 6.9     . LDL CALC < 100   146  84       ...      Progress Toward Treatment Goals:  Treatment Goal 12/24/2012  Hemoglobin A1C at goal  Blood pressure deteriorated  Stop smoking smoking the same amount    Self Care Goals & Plans:  Self Care Goal 12/24/2012  Manage my medications take my medicines as prescribed; bring my medications to every visit; refill my medications on time  Monitor my health -  Eat healthy foods drink diet soda or water instead of juice or soda; eat more vegetables  Be physically active -  Stop smoking go to the [facility] website ([email or web address])    Home Blood Glucose Monitoring 12/24/2012  Check my blood sugar once a day  When to check my blood sugar before breakfast     Care Management & Community Referrals:  Referral 12/24/2012  Referrals made for care management support none needed

## 2012-12-24 NOTE — Assessment & Plan Note (Signed)
Patient was noted to have a new ~ 5 cm right ovarian cyst and followup repeat pelvic ultrasound is recommended in 6-12 weeks. Patient was also noted to have endometrium thickness ~ 13mm , decreased from her prior ultrasound in 0 9/13 when it measured 20 mm.  As per radiologist, This is within normal limits in the absence of abnormal uterine bleeding. If the patient does have vaginal bleeding whichremains unresponsive to hormonal or medical therapy, sonohysterogram should be considered for focal lesion work-up. She was also noted to have uterine fibroids. Given her history of iron deficiency anemia with family history of colon cancer( sister died at age 20), she is at risk for [name] syndrome or HNP [facility] having high predilection for Ovarian( 10-20%) and endometrial cancer( 60%).   -Refer her to OB/GYN for further evaluation. -Scheduled a repeat transvaginal ultrasound on 02/01/2013.

## 2012-12-24 NOTE — Assessment & Plan Note (Signed)
Urgent care followup for right breast boil for last 5 days. She reports having increased itching last week followed by a boil . She was given doxycycline, mupirocin ointment and triamcinolone from the ED. She states that mupirocin  is expensive for her and she could not afford it.  I Think her increased itching could be related to sweat( patient reports that she has to wear an extra color on top of her clothes at her work) -Continue doxycycline - Ibuprofen for pain -Warm compresses to the breast. -Advised to wear cotton under clothes. -Okay not to use mupirocin if she can not afford it but keep taking antibiotics -May use eucerine ointment in future to help with itching -Call the clinic if boil doesn't improve in a week.
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
## 2012-12-24 NOTE — Telephone Encounter (Signed)
Agree. Tina [name]

## 2012-12-24 NOTE — Telephone Encounter (Signed)
Pt calls stating she has cold, cough, congestion for 3+ days, denies fevers, appt w/ her pcp at 1315, dr Dorthula Rue.

## 2012-12-24 NOTE — Assessment & Plan Note (Signed)
Patient is aware of her GI appts.

## 2012-12-24 NOTE — ED Provider Notes (Signed)
Medical screening examination/treatment/procedure(s) were performed by resident physician or non-physician practitioner and as supervising physician I was immediately available for consultation/collaboration.   KINDL,JAMES DOUGLAS MD.   James D Kindl, MD 12/24/12 1721 

## 2012-12-27 ENCOUNTER — Ambulatory Visit (AMBULATORY_SURGERY_CENTER): Payer: Self-pay | Admitting: *Deleted

## 2012-12-27 VITALS — Ht 76.0 in | Wt 285.0 lb

## 2012-12-27 DIAGNOSIS — Z1211 Encounter for screening for malignant neoplasm of colon: Secondary | ICD-10-CM

## 2012-12-27 MED ORDER — MOVIPREP 100 G PO SOLR: ORAL | Status: DC

## 2012-12-27 NOTE — Progress Notes (Signed)
Pt. Given free coupon for Moviprep

## 2012-12-28 ENCOUNTER — Encounter: Payer: Self-pay | Admitting: Internal Medicine

## 2012-12-28 ENCOUNTER — Ambulatory Visit (INDEPENDENT_AMBULATORY_CARE_PROVIDER_SITE_OTHER): Payer: PRIVATE HEALTH INSURANCE | Admitting: Internal Medicine

## 2012-12-28 ENCOUNTER — Encounter: Payer: Self-pay | Admitting: Obstetrics & Gynecology

## 2012-12-28 VITALS — BP 146/81 | HR 102 | Temp 98.0°F | Ht 74.0 in | Wt 288.8 lb

## 2012-12-28 DIAGNOSIS — L02239 Carbuncle of trunk, unspecified: Secondary | ICD-10-CM

## 2012-12-28 DIAGNOSIS — E119 Type 2 diabetes mellitus without complications: Secondary | ICD-10-CM

## 2012-12-28 DIAGNOSIS — D509 Iron deficiency anemia, unspecified: Secondary | ICD-10-CM

## 2012-12-28 DIAGNOSIS — N611 Abscess of the breast and nipple: Secondary | ICD-10-CM

## 2012-12-28 LAB — GLUCOSE, CAPILLARY: Glucose-Capillary: 114 mg/dL — ABNORMAL HIGH (ref 70–99)

## 2012-12-28 MED ORDER — DOXYCYCLINE HYCLATE 100 MG PO CAPS: 100.0000 mg | ORAL_CAPSULE | Freq: Two times a day (BID) | ORAL | Status: DC

## 2012-12-28 MED ORDER — IBUPROFEN 200 MG PO TABS: 200.0000 mg | ORAL_TABLET | Freq: Four times a day (QID) | ORAL | Status: DC | PRN

## 2012-12-28 MED ORDER — HYDROCODONE-ACETAMINOPHEN 5-300 MG PO TABS: 1.0000 | ORAL_TABLET | Freq: Two times a day (BID) | ORAL | Status: DC
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
## 2012-12-28 NOTE — Assessment & Plan Note (Signed)
Patient did pick up her coupon for movie prep from GI office yesterday with the plans to proceed with colonoscopy. She has an appointment coming up with OB/GYN in March 2014. She was made aware of all her appointments again and strongly encouraged not to miss any of her appointments.

## 2012-12-28 NOTE — Assessment & Plan Note (Signed)
Patient returns complaining of persistent pain in the site of her right breast boil. She also reports burning pain in her bilateral breast with her menstrual cycle just coming up. 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 On exam the site of the right breast boil appears to be healing well. But given her persistent pain, I encouraged her to use ibuprofen instead of tylenol given its anti- inflammatory effects.  -Will also give her a prescription for 10 tablets of Vicodin for severe pain. -She was advised to use cool compresses to the breast. -Would give her 7 more days of doxycycline. -Call the clinic if she doesn't feel better in a week. - Agree with switching the detergent ( may have caused her increased itching preceding the whole episode).

## 2012-12-28 NOTE — Patient Instructions (Addendum)
General Instructions: Please schedule a follow up appointment in 2 months. Please bring your medication bottles with your next appointment. Please take your medicines as prescribed. Please use cool compresses on the affected area    Treatment Goals:  Goals (1 Years of Data) as of 12/28/12         12/09/12 07/19/12 12/05/11 05/18/10     Result Component    . HEMOGLOBIN A1C < 6.5  5.8 6.1 6.9     . LDL CALC < 100   146  84       ...      Progress Toward Treatment Goals:  Treatment Goal 12/28/2012  Hemoglobin A1C at goal  Blood pressure at goal  Stop smoking stopped smoking    Self Care Goals & Plans:  Self Care Goal 12/28/2012  Manage my medications take my medicines as prescribed; bring my medications to every visit; refill my medications on time  Monitor my health -  Eat healthy foods drink diet soda or water instead of juice or soda; eat more vegetables  Be physically active -  Stop smoking -    Home Blood Glucose Monitoring 12/28/2012  Check my blood sugar once a day  When to check my blood sugar -     Care Management & Community Referrals:  Referral 12/28/2012  Referrals made for care management support none needed

## 2013-01-11 ENCOUNTER — Ambulatory Visit (AMBULATORY_SURGERY_CENTER): Payer: PRIVATE HEALTH INSURANCE | Admitting: Gastroenterology

## 2013-01-11 ENCOUNTER — Encounter: Payer: Self-pay | Admitting: Gastroenterology

## 2013-01-11 ENCOUNTER — Other Ambulatory Visit: Payer: Self-pay | Admitting: Gastroenterology

## 2013-01-11 VITALS — BP 119/76 | HR 62 | Temp 98.0°F | Resp 32 | Ht 76.0 in | Wt 285.0 lb

## 2013-01-11 DIAGNOSIS — D126 Benign neoplasm of colon, unspecified: Secondary | ICD-10-CM

## 2013-01-11 DIAGNOSIS — Z1211 Encounter for screening for malignant neoplasm of colon: Secondary | ICD-10-CM

## 2013-01-11 DIAGNOSIS — D509 Iron deficiency anemia, unspecified: Secondary | ICD-10-CM

## 2013-01-11 DIAGNOSIS — Z800 Family history of malignant neoplasm of digestive organs: Secondary | ICD-10-CM

## 2013-01-11 MED ORDER — SODIUM CHLORIDE 0.9 % IV SOLN: 500.0000 mL | INTRAVENOUS | Status: DC

## 2013-01-11 NOTE — Op Note (Signed)
[facility] [address] [place], [address]   COLONOSCOPY PROCEDURE REPORT  PATIENT: Tina, [name]  MR#: [id number] BIRTHDATE: [birth year] , 41  yrs. old GENDER: Female ENDOSCOPIST: Meryl Dare, MD, Clementeen Graham REFERRED BY: Elyse Jarvis, MD PROCEDURE DATE:  01/11/2013 PROCEDURE:   Colonoscopy with snare polypectomy ASA CLASS:   Class II INDICATIONS:elevated risk screening: patient's immediate family history of colon cancer and Iron Deficiency Anemia. MEDICATIONS: MAC sedation, administered by CRNA and propofol (Diprivan) 380mg  IV DESCRIPTION OF PROCEDURE:   After the risks benefits and alternatives of the procedure were thoroughly explained, informed consent was obtained.  A digital rectal exam revealed no abnormalities of the rectum.   The LB [id number]  endoscope was introduced through the anus and advanced to the cecum, which was identified by both the appendix and ileocecal valve. No adverse events experienced with an elongated, tortuous colon.   The quality of the prep was good, using MoviPrep  The instrument was then slowly withdrawn as the colon was fully examined.  COLON FINDINGS: Five sessile polyps measuring 4-6 mm in size were found in the sigmoid colon.  A polypectomy was performed with a cold snare.  The resection was complete and the polyp tissue was completely retrieved.   The colon was otherwise normal.  There was no diverticulosis, inflammation, polyps or cancers unless previously stated.  Retroflexed views revealed small internal hemorrhoids. The time to cecum=4 minutes 42 seconds.  Withdrawal time=11 minutes 41 seconds.  The scope was withdrawn and the procedure completed.  COMPLICATIONS: There were no complications.  ENDOSCOPIC IMPRESSION: 1.   Five sessile polyps measuring 4-6 mm in the sigmoid colon; polypectomy performed with a cold snare 2.   Small internal hemorrhoids  RECOMMENDATIONS: 1.  Await pathology  results 2.  Repeat Colonoscopy in 5 years.  eSigned:  Meryl Dare, MD, [facility] 01/11/2013 11:56 AM

## 2013-01-11 NOTE — Patient Instructions (Addendum)
Discharge instructions given with verbal understanding. Handouts on polyps and hemorrhoids given. Resume previous medications. YOU HAD AN ENDOSCOPIC PROCEDURE TODAY AT THE [facility]: Refer to the procedure report that was given to you for any specific questions about what was found during the examination.  If the procedure report does not answer your questions, please call your gastroenterologist to clarify.  If you requested that your care partner not be given the details of your procedure findings, then the procedure report has been included in a sealed envelope for you to review at your convenience later.  YOU SHOULD EXPECT: Some feelings of bloating in the abdomen. Passage of more gas than usual.  Walking can help get rid of the air that was put into your GI tract during the procedure and reduce the bloating. If you had a lower endoscopy (such as a colonoscopy or flexible sigmoidoscopy) you may notice spotting of blood in your stool or on the toilet paper. If you underwent a bowel prep for your procedure, then you may not have a normal bowel movement for a few days.  DIET: Your first meal following the procedure should be a light meal and then it is ok to progress to your normal diet.  A half-sandwich or bowl of soup is an example of a good first meal.  Heavy or fried foods are harder to digest and may make you feel nauseous or bloated.  Likewise meals heavy in dairy and vegetables can cause extra gas to form and this can also increase the bloating.  Drink plenty of fluids but you should avoid alcoholic beverages for 24 hours.  ACTIVITY: Your care partner should take you home directly after the procedure.  You should plan to take it easy, moving slowly for the rest of the day.  You can resume normal activity the day after the procedure however you should NOT DRIVE or use heavy machinery for 24 hours (because of the sedation medicines used during the test).    SYMPTOMS TO REPORT  IMMEDIATELY: A gastroenterologist can be reached at any hour.  During normal business hours, 8:30 AM to 5:00 PM Monday through Friday, call [phone].  After hours and on weekends, please call the GI answering service at [phone] who will take a message and have the physician on call contact you.   Following lower endoscopy (colonoscopy or flexible sigmoidoscopy):  Excessive amounts of blood in the stool  Significant tenderness or worsening of abdominal pains  Swelling of the abdomen that is new, acute  Fever of 100F or higher FOLLOW UP: If any biopsies were taken you will be contacted by phone or by letter within the next 1-3 weeks.  Call your gastroenterologist if you have not heard about the biopsies in 3 weeks.  Our staff will call the home number listed on your records the next business day following your procedure to check on you and address any questions or concerns that you may have at that time regarding the information given to you following your procedure. This is a courtesy call and so if there is no answer at the home number and we have not heard from you through the emergency physician on call, we will assume that you have returned to your regular daily activities without incident.  SIGNATURES/CONFIDENTIALITY: You and/or your care partner have signed paperwork which will be entered into your electronic medical record.  These signatures attest to the fact that that the information above on your After Visit Summary   has been reviewed and is understood.  Full responsibility of the confidentiality of this discharge information lies with you and/or your care-partner.  

## 2013-01-11 NOTE — Progress Notes (Signed)
Reported off to Ardeen Jourdain RN.

## 2013-01-11 NOTE — Progress Notes (Signed)
IV In right wrist non functioning even after manipulation D/Ced, DSD applied . New patent 22g IV started right top hand x1,Maholtz CRNA

## 2013-01-11 NOTE — Progress Notes (Signed)
Report to pacu rn, vss, bbs=clear 

## 2013-01-11 NOTE — Progress Notes (Signed)
Patient did not experience any of the following events: a burn prior to discharge; a fall within the facility; wrong site/side/patient/procedure/implant event; or a hospital transfer or hospital admission upon discharge from the facility. (G8907) Patient did not have preoperative order for IV antibiotic SSI prophylaxis. (G8918)  

## 2013-01-11 NOTE — Progress Notes (Signed)
Called to room to assist during endoscopic procedure.  Patient ID and intended procedure confirmed with present staff. Received instructions for my participation in the procedure from the performing physician.  

## 2013-01-12 ENCOUNTER — Telehealth: Payer: Self-pay

## 2013-01-12 NOTE — Telephone Encounter (Signed)
Left message on answering machine. 

## 2013-01-13 ENCOUNTER — Encounter: Payer: Self-pay | Admitting: Obstetrics & Gynecology

## 2013-01-13 ENCOUNTER — Ambulatory Visit (INDEPENDENT_AMBULATORY_CARE_PROVIDER_SITE_OTHER): Payer: Self-pay | Admitting: Obstetrics & Gynecology

## 2013-01-13 VITALS — BP 142/79 | HR 70 | Temp 98.4°F | Ht 76.0 in | Wt 289.2 lb

## 2013-01-13 DIAGNOSIS — N83201 Unspecified ovarian cyst, right side: Secondary | ICD-10-CM

## 2013-01-13 DIAGNOSIS — Z01812 Encounter for preprocedural laboratory examination: Secondary | ICD-10-CM

## 2013-01-13 DIAGNOSIS — N83209 Unspecified ovarian cyst, unspecified side: Secondary | ICD-10-CM

## 2013-01-13 DIAGNOSIS — N920 Excessive and frequent menstruation with regular cycle: Secondary | ICD-10-CM
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
## 2013-01-13 NOTE — Patient Instructions (Signed)
Ovarian Cyst The ovaries are small organs that are on each side of the uterus. The ovaries are the organs that produce the female hormones, estrogen and progesterone. An ovarian cyst is a sac filled with fluid that can vary in its size. It is normal for a small cyst to form in women who are in the childbearing age and who have menstrual periods. This type of cyst is called a follicle cyst that becomes an ovulation cyst (corpus luteum cyst) after it produces the women's egg. It later goes away on its own if the woman does not become pregnant. There are other kinds of ovarian cysts that may cause problems and may need to be treated. The most serious problem is a cyst with cancer. It should be noted that menopausal women who have an ovarian cyst are at a higher risk of it being a cancer cyst. They should be evaluated very quickly, thoroughly and followed closely. This is especially true in menopausal women because of the high rate of ovarian cancer in women in menopause. CAUSES AND TYPES OF OVARIAN CYSTS:  FUNCTIONAL CYST: The follicle/corpus luteum cyst is a functional cyst that occurs every month during ovulation with the menstrual cycle. They go away with the next menstrual cycle if the woman does not get pregnant. Usually, there are no symptoms with a functional cyst.  ENDOMETRIOMA CYST: This cyst develops from the lining of the uterus tissue. This cyst gets in or on the ovary. It grows every month from the bleeding during the menstrual period. It is also called a "chocolate cyst" because it becomes filled with blood that turns brown. This cyst can cause pain in the lower abdomen during intercourse and with your menstrual period.  CYSTADENOMA CYST: This cyst develops from the cells on the outside of the ovary. They usually are not cancerous. They can get very big and cause lower abdomen pain and pain with intercourse. This type of cyst can twist on itself, cut off its blood supply and cause severe pain. It  also can easily rupture and cause a lot of pain.  DERMOID CYST: This type of cyst is sometimes found in both ovaries. They are found to have different kinds of body tissue in the cyst. The tissue includes skin, teeth, hair, and/or cartilage. They usually do not have symptoms unless they get very big. Dermoid cysts are rarely cancerous.  POLYCYSTIC OVARY: This is a rare condition with hormone problems that produces many small cysts on both ovaries. The cysts are follicle-like cysts that never produce an egg and become a corpus luteum. It can cause an increase in body weight, infertility, acne, increase in body and facial hair and lack of menstrual periods or rare menstrual periods. Many women with this problem develop type 2 diabetes. The exact cause of this problem is unknown. A polycystic ovary is rarely cancerous.  THECA LUTEIN CYST: Occurs when too much hormone (human chorionic gonadotropin) is produced and over-stimulates the ovaries to produce an egg. They are frequently seen when doctors stimulate the ovaries for invitro-fertilization (test tube babies).  LUTEOMA CYST: This cyst is seen during pregnancy. Rarely it can cause an obstruction to the birth canal during labor and delivery. They usually go away after delivery. SYMPTOMS   Pelvic pain or pressure.  Pain during sexual intercourse.  Increasing girth (swelling) of the abdomen.  Abnormal menstrual periods.  Increasing pain with menstrual periods.  You stop having menstrual periods and you are not pregnant. DIAGNOSIS  The diagnosis can   be made during:  Routine or annual pelvic examination (common).  Ultrasound.  X-ray of the pelvis.  CT Scan.  MRI.  Blood tests. TREATMENT   Treatment may only be to follow the cyst monthly for 2 to 3 months with your caregiver. Many go away on their own, especially functional cysts.  May be aspirated (drained) with a long needle with ultrasound, or by laparoscopy (inserting a tube into  the pelvis through a small incision).  The whole cyst can be removed by laparoscopy.  Sometimes the cyst may need to be removed through an incision in the lower abdomen.  Hormone treatment is sometimes used to help dissolve certain cysts.  Birth control pills are sometimes used to help dissolve certain cysts. HOME CARE INSTRUCTIONS  Follow your caregiver's advice regarding:  Medicine.  Follow up visits to evaluate and treat the cyst.  You may need to come back or make an appointment with another caregiver, to find the exact cause of your cyst, if your caregiver is not a gynecologist.  Get your yearly and recommended pelvic examinations and Pap tests.  Let your caregiver know if you have had an ovarian cyst in the past. SEEK MEDICAL CARE IF:   Your periods are late, irregular, they stop, or are painful.  Your stomach (abdomen) or pelvic pain does not go away.  Your stomach becomes larger or swollen.  You have pressure on your bladder or trouble emptying your bladder completely.  You have painful sexual intercourse.  You have feelings of fullness, pressure, or discomfort in your stomach.  You lose weight for no apparent reason.  You feel generally ill.  You become constipated.  You lose your appetite.  You develop acne.  You have an increase in body and facial hair.  You are gaining weight, without changing your exercise and eating habits.  You think you are pregnant. SEEK IMMEDIATE MEDICAL CARE IF:   You have increasing abdominal pain.  You feel sick to your stomach (nausea) and/or vomit.  You develop a fever that comes on suddenly.  You develop abdominal pain during a bowel movement.  Your menstrual periods become heavier than usual. Document Released: 10/20/2005 Document Revised: 01/12/2012 Document Reviewed: 08/23/2009 [facility] Patient Information 2013 [place], [place]. Menorrhagia Dysfunctional uterine bleeding is different from a normal menstrual  period. When periods are heavy or there is more bleeding than is usual for you, it is called menorrhagia. It may be caused by hormonal imbalance, or physical, metabolic, or other problems. Examination is necessary in order that your caregiver may treat treatable causes. If this is a continuing problem, a D&C may be needed. That means that the cervix (the opening of the uterus or womb) is dilated (stretched larger) and the lining of the uterus is scraped out. The tissue scraped out is then examined under a microscope by a specialist (pathologist) to make sure there is nothing of concern that needs further or more extensive treatment. HOME CARE INSTRUCTIONS   If medications were prescribed, take exactly as directed. Do not change or switch medications without consulting your caregiver.  Long term heavy bleeding may result in iron deficiency. Your caregiver may have prescribed iron pills. They help replace the iron your body lost from heavy bleeding. Take exactly as directed. Iron may cause constipation. If this becomes a problem, increase the bran, fruits, and roughage in your diet.  Do not take aspirin or medicines that contain aspirin one week before or during your menstrual period. Aspirin may make the  bleeding worse.  If you need to change your sanitary pad or tampon more than once every 2 hours, stay in bed and rest as much as possible until the bleeding stops.  Eat well-balanced meals. Eat foods high in iron. Examples are leafy green vegetables, meat, liver, eggs, and whole grain breads and cereals. Do not try to lose weight until the abnormal bleeding has stopped and your blood iron level is back to normal. SEEK MEDICAL CARE IF:   You need to change your sanitary pad or tampon more than once an hour.  You develop nausea (feeling sick to your stomach) and vomiting, dizziness, or diarrhea while you are taking your medicine.  You have any problems that may be related to the medicine you are  taking. SEEK IMMEDIATE MEDICAL CARE IF:   You have a fever.  You develop chills.  You develop severe bleeding or start to pass blood clots.  You feel dizzy or faint. MAKE SURE YOU:   Understand these instructions.  Will watch your condition.  Will get help right away if you are not doing well or get worse. Document Released: 10/20/2005 Document Revised: 01/12/2012 Document Reviewed: 06/09/2008 [facility] Patient Information 2013 [place], [place].

## 2013-01-23 ENCOUNTER — Encounter: Payer: Self-pay | Admitting: Gastroenterology

## 2013-02-01 ENCOUNTER — Ambulatory Visit (HOSPITAL_COMMUNITY): Payer: Self-pay

## 2013-02-01 ENCOUNTER — Other Ambulatory Visit (HOSPITAL_COMMUNITY): Payer: Self-pay

## 2013-02-07 ENCOUNTER — Encounter: Payer: Self-pay | Admitting: Internal Medicine

## 2013-02-07 ENCOUNTER — Ambulatory Visit (INDEPENDENT_AMBULATORY_CARE_PROVIDER_SITE_OTHER): Payer: Self-pay | Admitting: Internal Medicine

## 2013-02-07 VITALS — BP 146/75 | HR 97 | Temp 98.1°F | Ht 76.0 in

## 2013-02-07 DIAGNOSIS — D509 Iron deficiency anemia, unspecified: Secondary | ICD-10-CM

## 2013-02-07 DIAGNOSIS — K5909 Other constipation: Secondary | ICD-10-CM | POA: Insufficient documentation

## 2013-02-07 DIAGNOSIS — K5900 Constipation, unspecified: Secondary | ICD-10-CM

## 2013-02-07 DIAGNOSIS — E119 Type 2 diabetes mellitus without complications: Secondary | ICD-10-CM

## 2013-02-07 MED ORDER — DOCUSATE SODIUM 100 MG PO CAPS: 100.0000 mg | ORAL_CAPSULE | Freq: Two times a day (BID) | ORAL | Status: DC

## 2013-02-07 MED ORDER — HYDROCORTISONE 2.5 % RE CREA: TOPICAL_CREAM | RECTAL | Status: DC

## 2013-02-07 MED ORDER — BISACODYL 5 MG PO TBEC: 5.0000 mg | DELAYED_RELEASE_TABLET | Freq: Every day | ORAL | Status: DC | PRN

## 2013-02-07 NOTE — Patient Instructions (Addendum)
General Instructions:  Please follow-up at the clinic in 1 week , at which time we will reevaluate your constipation - OR, please follow-up in the clinic sooner if needed.  There have been changes in your medications - see your medication sheet.   You can get fleetz enemas as your pharmacy.  If you have been started on new medication(s), and you develop symptoms concerning for allergic reaction, including, but not limited to, throat closing, tongue swelling, rash, please stop the medication immediately and call the clinic at [phone], and go to the ER.  If you are diabetic, please bring your meter to your next visit.  If symptoms worsen, or new symptoms arise, please call the clinic or go to the ER.  PLEASE BRING ALL OF YOUR MEDICATIONS  IN A BAG TO YOUR NEXT APPOINTMENT   Treatment Goals:  Goals (1 Years of Data) as of 02/07/13         12/09/12 07/19/12 12/05/11 05/18/10     Result Component    . HEMOGLOBIN A1C < 6.5  5.8 6.1 6.9     . LDL CALC < 100   146  84       ...      Progress Toward Treatment Goals:  Treatment Goal 12/28/2012  Hemoglobin A1C at goal  Blood pressure at goal  Stop smoking stopped smoking    Self Care Goals & Plans:  Self Care Goal 02/07/2013  Manage my medications take my medicines as prescribed; refill my medications on time; bring my medications to every visit  Monitor my health keep track of my blood glucose; bring my glucose meter and log to each visit  Eat healthy foods drink diet soda or water instead of juice or soda; eat foods that are low in salt; eat fruit for snacks and desserts  Be physically active find an activity I enjoy  Stop smoking go to the [employer] ([email or web address])    Home Blood Glucose Monitoring 12/28/2012  Check my blood sugar once a day  When to check my blood sugar -     Care Management & Community Referrals:  Referral 12/28/2012  Referrals made for care management support none needed        Sitz Bath A  sitz bath is a warm water bath taken in the sitting position that covers only the hips and buttocks. It may be used for either healing or hygiene purposes. Sitz baths are also used to relieve pain, itching, or muscle spasms. The water may contain medicine. Moist heat will help you heal and relax.  HOME CARE INSTRUCTIONS   Fill the bathtub half full with warm water.  Sit in the water and open the drain a little.  Turn on the warm water to keep the tub half full. Keep the water running constantly.  Soak in the water for 15 to 20 minutes.  After the sitz bath, pat the affected area dry first.  Take 3 to 4 sitz baths a day. SEEK MEDICAL CARE IF:  You get worse instead of better. Stop the sitz baths if you get worse. MAKE SURE YOU:  Understand these instructions.  Will watch your condition.  Will get help right away if you are not doing well or get worse. Document Released: 07/12/2004 Document Revised: 01/12/2012 Document Reviewed: 01/17/2011 [facility] Patient Information 2013 [place], [place].

## 2013-02-07 NOTE — Assessment & Plan Note (Addendum)
Assessment: Patient has had profound constipation over last > 1 year, somewhat in correlation with how long she has been taking iron supplementation for her iron deficiency anemia. She has just had a colonoscopy in 01/2013 showing internal hemorrhoids and 4 sessile polyps (hyperplastic). PE shows large painful hemorrhoid and impacted soft stool, I was only able to minimally disimapact her. No other indication of electrolyte abnormalities or meds to contribute to such.  Plan:      Will start daily stool stofteners (colace) and bisacodyl.  High fiber diet  Anuzol-HCTZ cream for pain  Surgery referral in future if hemorrhoid continues to be significantly painful. Patient informed of the red flag symptoms that should prompt her immediate return for reevaluation - such as, but not limited to the following: worsening abd pain, N/V, fevers, chills. She expresses understanding of the information provided.  Unfortunately, pt left prior to anemia panel being collected - will plan next visit to assess if she needs to stay on iron supplementation.

## 2013-02-07 NOTE — Assessment & Plan Note (Signed)
Pertinent Labs: Lab Results  Component Value Date   HGBA1C 5.8 12/09/2012   HGBA1C 6.1 09/19/2010   CREATININE 0.67 12/09/2012   CREATININE 0.67 05/17/2010   MICROALBUR 1.80 12/09/2012   MICRALBCREAT 11.1 12/09/2012   CHOL 224* 07/19/2012   HDL 34* 07/19/2012   TRIG 222* 07/19/2012    Assessment: Disease Control:  Controlled  Progress toward goals:  At goal  Barriers to meeting goals: no barriers identified    Patient is compliant most of the time with prescribed medications.   Metformin 1000mg  BID was decreased to 500mg  BID during last visit in 12/2012 because of A1c of 5.8 and reported hypoglycemia to 40-60s regularly. She has not been checking her blood sugars at home because her meter is broken, therefore, unknown if she is still having these values.  Plan: Glucometer log was not reviewed today, as pt did not have glucometer available for review.   continue current medications  Patient's meter is broken - I asked her to followup with Lupita Leash so we can get it replaced - she agrees  Educational resources provided:    Product manager tools provided:    Home glucose monitoring recommendation:    Start checking blood sugars at least once daily or if feeling poorly so we can assess if further decrease of Metformin is warranted.
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
## 2013-02-07 NOTE — Progress Notes (Signed)
I discussed the patient with resident Dr. Saralyn Pilar at the time of the visit, and I reviewed the history, findings, diagnosis, and treatment plan as outlined in the resident's note. I agree with the assessment and plans as outlined in her note.

## 2013-02-09 ENCOUNTER — Ambulatory Visit (HOSPITAL_COMMUNITY)
Admission: RE | Admit: 2013-02-09 | Discharge: 2013-02-09 | Disposition: A | Discharge status: Home / Self Care | Payer: Self-pay | Source: Ambulatory Visit | Attending: Internal Medicine | Admitting: Internal Medicine

## 2013-02-09 ENCOUNTER — Other Ambulatory Visit: Payer: Self-pay | Admitting: Internal Medicine

## 2013-02-09 DIAGNOSIS — N83201 Unspecified ovarian cyst, right side: Secondary | ICD-10-CM

## 2013-02-09 DIAGNOSIS — N83209 Unspecified ovarian cyst, unspecified side: Secondary | ICD-10-CM | POA: Insufficient documentation

## 2013-02-09 DIAGNOSIS — R9389 Abnormal findings on diagnostic imaging of other specified body structures: Secondary | ICD-10-CM | POA: Insufficient documentation

## 2013-02-09 DIAGNOSIS — D259 Leiomyoma of uterus, unspecified: Secondary | ICD-10-CM | POA: Insufficient documentation

## 2013-02-14 ENCOUNTER — Ambulatory Visit: Payer: Self-pay | Admitting: Internal Medicine

## 2013-02-14 ENCOUNTER — Encounter: Payer: Self-pay | Admitting: Internal Medicine

## 2013-03-10 ENCOUNTER — Other Ambulatory Visit: Payer: Self-pay | Admitting: *Deleted

## 2013-03-10 DIAGNOSIS — I10 Essential (primary) hypertension: Secondary | ICD-10-CM

## 2013-03-10 MED ORDER — ENALAPRIL MALEATE 5 MG PO TABS: 5.0000 mg | ORAL_TABLET | Freq: Every day | ORAL | Status: DC

## 2013-04-13 ENCOUNTER — Encounter: Payer: Self-pay | Admitting: Dietician

## 2013-05-05 ENCOUNTER — Emergency Department (HOSPITAL_BASED_OUTPATIENT_CLINIC_OR_DEPARTMENT_OTHER)
Admission: EM | Admit: 2013-05-05 | Discharge: 2013-05-05 | Disposition: A | Discharge status: Home / Self Care | Payer: Self-pay | Attending: Emergency Medicine | Admitting: Emergency Medicine

## 2013-05-05 ENCOUNTER — Encounter (HOSPITAL_BASED_OUTPATIENT_CLINIC_OR_DEPARTMENT_OTHER): Payer: Self-pay | Admitting: *Deleted

## 2013-05-05 DIAGNOSIS — D509 Iron deficiency anemia, unspecified: Secondary | ICD-10-CM | POA: Insufficient documentation

## 2013-05-05 DIAGNOSIS — R220 Localized swelling, mass and lump, head: Secondary | ICD-10-CM | POA: Insufficient documentation

## 2013-05-05 DIAGNOSIS — F17200 Nicotine dependence, unspecified, uncomplicated: Secondary | ICD-10-CM | POA: Insufficient documentation

## 2013-05-05 DIAGNOSIS — I10 Essential (primary) hypertension: Secondary | ICD-10-CM | POA: Insufficient documentation

## 2013-05-05 DIAGNOSIS — E785 Hyperlipidemia, unspecified: Secondary | ICD-10-CM | POA: Insufficient documentation

## 2013-05-05 DIAGNOSIS — Z79899 Other long term (current) drug therapy: Secondary | ICD-10-CM | POA: Insufficient documentation

## 2013-05-05 DIAGNOSIS — Z8639 Personal history of other endocrine, nutritional and metabolic disease: Secondary | ICD-10-CM | POA: Insufficient documentation

## 2013-05-05 DIAGNOSIS — M5430 Sciatica, unspecified side: Secondary | ICD-10-CM | POA: Insufficient documentation

## 2013-05-05 DIAGNOSIS — K0889 Other specified disorders of teeth and supporting structures: Secondary | ICD-10-CM

## 2013-05-05 DIAGNOSIS — K089 Disorder of teeth and supporting structures, unspecified: Secondary | ICD-10-CM | POA: Insufficient documentation

## 2013-05-05 DIAGNOSIS — Z8542 Personal history of malignant neoplasm of other parts of uterus: Secondary | ICD-10-CM | POA: Insufficient documentation

## 2013-05-05 DIAGNOSIS — Z87440 Personal history of urinary (tract) infections: Secondary | ICD-10-CM | POA: Insufficient documentation

## 2013-05-05 DIAGNOSIS — E119 Type 2 diabetes mellitus without complications: Secondary | ICD-10-CM | POA: Insufficient documentation

## 2013-05-05 DIAGNOSIS — Z8742 Personal history of other diseases of the female genital tract: Secondary | ICD-10-CM | POA: Insufficient documentation

## 2013-05-05 DIAGNOSIS — R221 Localized swelling, mass and lump, neck: Secondary | ICD-10-CM | POA: Insufficient documentation

## 2013-05-05 DIAGNOSIS — Z862 Personal history of diseases of the blood and blood-forming organs and certain disorders involving the immune mechanism: Secondary | ICD-10-CM | POA: Insufficient documentation

## 2013-05-05 MED ORDER — HYDROCODONE-ACETAMINOPHEN 5-325 MG PO TABS: 2.0000 | ORAL_TABLET | ORAL | Status: DC | PRN

## 2013-05-05 MED ORDER — AMOXICILLIN 500 MG PO CAPS: 500.0000 mg | ORAL_CAPSULE | Freq: Three times a day (TID) | ORAL | Status: DC
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
## 2013-05-05 NOTE — ED Notes (Signed)
Toothache x 3 days

## 2013-05-06 NOTE — ED Provider Notes (Signed)
History/physical exam/procedure(s) were performed by non-physician practitioner and as supervising physician I was immediately available for consultation/collaboration. I have reviewed all notes and am in agreement with care and plan.   Hilario Quarry, MD 05/06/13 2230

## 2013-05-12 ENCOUNTER — Other Ambulatory Visit: Payer: Self-pay

## 2013-06-20 ENCOUNTER — Ambulatory Visit (INDEPENDENT_AMBULATORY_CARE_PROVIDER_SITE_OTHER): Payer: Self-pay | Admitting: Internal Medicine

## 2013-06-20 ENCOUNTER — Encounter: Payer: Self-pay | Admitting: Internal Medicine

## 2013-06-20 VITALS — BP 125/72 | HR 82 | Temp 97.8°F | Ht 76.0 in | Wt 294.7 lb

## 2013-06-20 DIAGNOSIS — I10 Essential (primary) hypertension: Secondary | ICD-10-CM

## 2013-06-20 DIAGNOSIS — L02239 Carbuncle of trunk, unspecified: Secondary | ICD-10-CM

## 2013-06-20 DIAGNOSIS — G4762 Sleep related leg cramps: Secondary | ICD-10-CM

## 2013-06-20 DIAGNOSIS — E119 Type 2 diabetes mellitus without complications: Secondary | ICD-10-CM

## 2013-06-20 DIAGNOSIS — N83209 Unspecified ovarian cyst, unspecified side: Secondary | ICD-10-CM

## 2013-06-20 DIAGNOSIS — L84 Corns and callosities: Secondary | ICD-10-CM | POA: Insufficient documentation

## 2013-06-20 DIAGNOSIS — N611 Abscess of the breast and nipple: Secondary | ICD-10-CM

## 2013-06-20 DIAGNOSIS — D509 Iron deficiency anemia, unspecified: Secondary | ICD-10-CM

## 2013-06-20 DIAGNOSIS — E785 Hyperlipidemia, unspecified: Secondary | ICD-10-CM

## 2013-06-20 LAB — LIPID PANEL
Cholesterol: 173 mg/dL (ref 0–200)
HDL: 39 mg/dL — ABNORMAL LOW (ref >39)
LDL Cholesterol: 93 mg/dL (ref 0–99)
Total CHOL/HDL Ratio: 4.4 Ratio
Triglycerides: 206 mg/dL — ABNORMAL HIGH (ref <150)
VLDL: 41 mg/dL — ABNORMAL HIGH (ref 0–40)

## 2013-06-20 LAB — GLUCOSE, CAPILLARY: Glucose-Capillary: 112 mg/dL — ABNORMAL HIGH (ref 70–99)
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
## 2013-06-20 NOTE — Assessment & Plan Note (Signed)
Pt has significant calluses B/L on feet with a small area of tenderness on the left lateral-posterior plantar surface  -follow up with podiatry when pt gets "orange card" -purchase bag balm at pharmacy for emoillient; rub on foot and wear socks to bed

## 2013-06-20 NOTE — Assessment & Plan Note (Addendum)
Pt has a h/o iron deficiency due to menorrhagia and fibroids; she is on ferrous sulfate supplemented with bisacodyl for constipation; however, she reports chronic constipation and does not take her iron everyday;  previous cbc and ferrtin was:   Lab Results  Component Value Date   WBC 9.1 12/09/2012   HGB 10.5* 12/09/2012   HCT 32.0* 12/09/2012   MCV 77.7* 12/09/2012   PLT 526* 12/09/2012    Lab Results  Component Value Date   FERRITIN 10 06/27/2011    -will recheck ferritin today -considered poly-iron but it is too expensive for pt to afford -advised to take colace with iron but states it doesn't help -continue MOM as needed for constipation as this works for her

## 2013-06-20 NOTE — Patient Instructions (Addendum)
Return to clinic in 4-6 months  1. We will check your lipid levels today and your iron level in your blood 2. Please continue your current medications 3. You will need to see podiatry for your foot problems with you get the "orange card" 4. Please use bag balm on your feet to soften the calluses

## 2013-06-20 NOTE — Assessment & Plan Note (Signed)
BP Readings from Last 3 Encounters:  06/20/13 125/72  05/05/13 147/64  02/07/13 146/75    Lab Results  Component Value Date   NA 139 12/09/2012   K 4.4 12/09/2012   CREATININE 0.67 12/09/2012    Assessment: Blood pressure control: controlled Progress toward BP goal:  at goal  Plan: Medications:  continue current medications-enalapril 5mg  daily

## 2013-06-20 NOTE — Assessment & Plan Note (Addendum)
Pt had previous boil on her right breast has resolved for the most part; it has dried up but there is still a residual small scar; advised pt that is common for diabetics to have various skin infections and it was not anything she has done in terms of lack of hygiene; she was given triamcinolone cream 0.1 % previously;  I advised her to discontinue using this as it is much too potent a steroid to be using on the breast and it is likely to thin the skin   -continue to monitor -discontinue triamcinolone

## 2013-06-20 NOTE — Assessment & Plan Note (Addendum)
Pt currently on pravastatin 40mg  every evening.  Last LDL: Lab Results  Component Value Date   LDLCALC 146* 07/19/2012    -pt not at goal (LDL<100) -recheck LP and titrate based on result

## 2013-06-20 NOTE — Assessment & Plan Note (Addendum)
Pt had ovarian cyst on a previous exam; last [place] in April revealed: 1. Echogenic, vascular mass-like area in the fundal endometrium  to the right of midline is suspicious for an endometrial polyp.  2. Probable uterine adenomyosis.  3. Three uterine fibroids as described above.  4. Interval resolution or significant decrease in the previously  described right ovarian cyst. Currently, the right ovary is within  normal limits for a premenopausal patient, with a dominant follicle  versus resolving cyst that measures 1.9 cm greatest diameter.  -continue to monitor

## 2013-06-20 NOTE — Assessment & Plan Note (Signed)
Lab Results  Component Value Date   HGBA1C 6.3 06/20/2013   HGBA1C 5.8 12/09/2012   HGBA1C 6.1 07/19/2012     Assessment: Diabetes control: good control (HgbA1C at goal) Progress toward A1C goal:  at goal  Plan: Medications:  continue current medications-metformin 500mg  twice daily Home glucose monitoring: Frequency: no home glucose monitoring-only on oral medication and at goal Timing: N/A Instruction/counseling given: discussed foot care-pt needs to get orange card so she can schedule appt. with podiatrist; pt has significant calluses B/L

## 2013-06-21 ENCOUNTER — Encounter: Payer: Self-pay | Admitting: Internal Medicine

## 2013-06-21 ENCOUNTER — Telehealth: Payer: Self-pay | Admitting: Internal Medicine

## 2013-06-21 NOTE — Progress Notes (Signed)
I saw and evaluated the patient.  I personally confirmed the key portions of the history and exam documented by Dr. Gill and I reviewed pertinent patient test results.  The assessment, diagnosis, and plan were formulated together and I agree with the documentation in the resident's note. 

## 2013-07-07 ENCOUNTER — Ambulatory Visit: Payer: Self-pay | Admitting: Dietician

## 2013-07-07 ENCOUNTER — Encounter: Payer: Self-pay | Admitting: Internal Medicine

## 2013-07-12 ENCOUNTER — Ambulatory Visit: Payer: Self-pay | Admitting: Dietician

## 2013-07-14 ENCOUNTER — Other Ambulatory Visit: Payer: Self-pay | Admitting: *Deleted

## 2013-07-14 DIAGNOSIS — I10 Essential (primary) hypertension: Secondary | ICD-10-CM

## 2013-07-14 DIAGNOSIS — E119 Type 2 diabetes mellitus without complications: Secondary | ICD-10-CM

## 2013-07-14 MED ORDER — METFORMIN HCL 1000 MG PO TABS
500.0000 mg | ORAL_TABLET | Freq: Two times a day (BID) | ORAL | Status: DC
Start: 2013-07-14 — End: 2014-01-25

## 2013-07-14 MED ORDER — ENALAPRIL MALEATE 5 MG PO TABS: 5.0000 mg | ORAL_TABLET | Freq: Every day | ORAL | Status: DC

## 2013-07-25 ENCOUNTER — Ambulatory Visit (INDEPENDENT_AMBULATORY_CARE_PROVIDER_SITE_OTHER): Payer: Self-pay | Admitting: Dietician

## 2013-07-25 DIAGNOSIS — E119 Type 2 diabetes mellitus without complications: Secondary | ICD-10-CM

## 2013-07-25 LAB — HM DIABETES EYE EXAM

## 2013-07-25 NOTE — Progress Notes (Signed)
Eye exam done today

## 2013-08-12 NOTE — Addendum Note (Signed)
Addended by: Remus Blake on: 08/12/2013 04:01 PM   Modules accepted: Orders

## 2013-08-12 NOTE — Addendum Note (Signed)
Addended by: Remus Blake on: 08/12/2013 04:02 PM   Modules accepted: Orders

## 2013-11-04 ENCOUNTER — Other Ambulatory Visit: Payer: Self-pay | Admitting: Internal Medicine

## 2013-12-13 ENCOUNTER — Other Ambulatory Visit: Payer: Self-pay | Admitting: *Deleted

## 2013-12-13 DIAGNOSIS — E785 Hyperlipidemia, unspecified: Secondary | ICD-10-CM

## 2013-12-13 MED ORDER — PRAVASTATIN SODIUM 40 MG PO TABS: 40.0000 mg | ORAL_TABLET | Freq: Every evening | ORAL | Status: DC

## 2014-01-10 ENCOUNTER — Encounter: Payer: Self-pay | Admitting: Internal Medicine

## 2014-01-25 ENCOUNTER — Ambulatory Visit (INDEPENDENT_AMBULATORY_CARE_PROVIDER_SITE_OTHER): Payer: BC Managed Care – PPO | Admitting: Internal Medicine

## 2014-01-25 ENCOUNTER — Encounter: Payer: Self-pay | Admitting: Internal Medicine

## 2014-01-25 ENCOUNTER — Ambulatory Visit: Payer: Self-pay | Admitting: Internal Medicine

## 2014-01-25 ENCOUNTER — Other Ambulatory Visit: Payer: Self-pay | Admitting: Internal Medicine

## 2014-01-25 VITALS — BP 137/75 | HR 65 | Temp 97.6°F | Ht 76.0 in | Wt 299.8 lb

## 2014-01-25 DIAGNOSIS — K5900 Constipation, unspecified: Secondary | ICD-10-CM

## 2014-01-25 DIAGNOSIS — L84 Corns and callosities: Secondary | ICD-10-CM

## 2014-01-25 DIAGNOSIS — Z0000 Encounter for general adult medical examination without abnormal findings: Secondary | ICD-10-CM

## 2014-01-25 DIAGNOSIS — D509 Iron deficiency anemia, unspecified: Secondary | ICD-10-CM

## 2014-01-25 DIAGNOSIS — K5909 Other constipation: Secondary | ICD-10-CM

## 2014-01-25 DIAGNOSIS — E785 Hyperlipidemia, unspecified: Secondary | ICD-10-CM

## 2014-01-25 DIAGNOSIS — E119 Type 2 diabetes mellitus without complications: Secondary | ICD-10-CM

## 2014-01-25 LAB — BASIC METABOLIC PANEL WITH GFR
BUN: 10 mg/dL (ref 6–23)
CALCIUM: 9.6 mg/dL (ref 8.4–10.5)
CO2: 28 mEq/L (ref 19–32)
Chloride: 104 mEq/L (ref 96–112)
Creat: 0.65 mg/dL (ref 0.50–1.10)
GFR, Est African American: >89 mL/min
Glucose, Bld: 97 mg/dL (ref 70–99)
POTASSIUM: 4.2 meq/L (ref 3.5–5.3)
SODIUM: 139 meq/L (ref 135–145)

## 2014-01-25 LAB — CBC
HCT: 35.7 % — ABNORMAL LOW (ref 36.0–46.0)
HEMOGLOBIN: 11.8 g/dL — AB (ref 12.0–15.0)
MCH: 27.7 pg (ref 26.0–34.0)
MCHC: 33.1 g/dL (ref 30.0–36.0)
MCV: 83.8 fL (ref 78.0–100.0)
Platelets: 396 10*3/uL (ref 150–400)
RBC: 4.26 MIL/uL (ref 3.87–5.11)
RDW: 14.7 % (ref 11.5–15.5)
WBC: 8.1 10*3/uL (ref 4.0–10.5)

## 2014-01-25 LAB — GLUCOSE, CAPILLARY: GLUCOSE-CAPILLARY: 111 mg/dL — AB (ref 70–99)

## 2014-01-25 LAB — POCT GLYCOSYLATED HEMOGLOBIN (HGB A1C): Hemoglobin A1C: 6.4

## 2014-01-25 MED ORDER — METFORMIN HCL 1000 MG PO TABS: 500.0000 mg | ORAL_TABLET | Freq: Two times a day (BID) | ORAL | Status: DC

## 2014-01-25 MED ORDER — PRAVASTATIN SODIUM 40 MG PO TABS: 40.0000 mg | ORAL_TABLET | Freq: Every evening | ORAL | Status: DC

## 2014-01-25 MED ORDER — ENALAPRIL MALEATE 5 MG PO TABS: ORAL_TABLET | ORAL | Status: DC
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
## 2014-01-25 NOTE — Assessment & Plan Note (Signed)
Lab Results  Component Value Date   HGBA1C 6.4 01/25/2014   HGBA1C 6.3 06/20/2013   HGBA1C 5.8 12/09/2012     Assessment: Diabetes control: good control (HgbA1C at goal) Progress toward A1C goal:  at goal  Plan: Medications:  continue current medications of metformin 1000mg  QD Instruction/counseling given: discussed foot care and referral to podiatry Other plans: check BMP, microalbumin/creatitine ratio

## 2014-01-25 NOTE — Assessment & Plan Note (Signed)
Pt continues to struggle with constipation and does not like to eat fruits and vegetables.  She has to iron for anemia and this is likely contributing.  She tried colace but the only thing that works for her is MOM.   -we discussed eating dried prunes, popcorn, and more vegetables which she is not really interested in -will provide her with a list of high fiber foods on next visit

## 2014-01-25 NOTE — Assessment & Plan Note (Deleted)
Pt has significant calluses bilaterally and has some cracking of the skin on the left heel which has minimal serous drainage without signs of infection.  She has been using lotions/creams without success.  -will refer to podiatry -instructed pt to inspect feet daily and to watch out for signs of infection including pain, increased swelling, redness, warmth, and malodorous

## 2014-01-25 NOTE — Patient Instructions (Signed)
Thank you for your visit today.   -Please return to the internal medicine clinic in 6 month(s) or sooner if needed.     -Your current medical regimen is effective;  continue present plan and all medications.  -I have made the following additions/changes to your medications: None  -I have sent your medication refills to your pharmacy.   -I have made the following referrals for you: Podiatry  -Please be sure to bring all of your medications with you to every visit; this includes herbal supplements, vitamins, eye drops, and any over-the-counter medications.   -Should you have any questions regarding your medications and/or any new or worsening symptoms, please be sure to call the clinic at [phone].   -If you believe that you are suffering from a life threatening condition or one that may result in the loss of limb or function, then you should call 911 or proceed to the nearest Emergency Department.

## 2014-01-25 NOTE — Assessment & Plan Note (Signed)
Pt is UTD.  -will get microalbumin/creatitine ratio today -foot exam today

## 2014-01-25 NOTE — Assessment & Plan Note (Addendum)
BP Readings from Last 3 Encounters:  01/25/14 137/75  06/20/13 125/72  05/05/13 147/64    Lab Results  Component Value Date   NA 139 12/09/2012   K 4.4 12/09/2012   CREATININE 0.67 12/09/2012    Assessment: Blood pressure control: controlled Progress toward BP goal:  at goal  Plan: Medications:  continue current medications of enalapril 5mg  QD

## 2014-01-25 NOTE — Assessment & Plan Note (Signed)
Pt reports compliance with ferrous sulfate twice daily. -check CBC today

## 2014-01-25 NOTE — Assessment & Plan Note (Signed)
Pt has a callus on the left heel that has split open and has minimal drainage of clear serous fluid without significant pain, erythema, warmth, or tenderness.  It is also without smell.  She has no fever/chills, N/V/D, or other systemic signs.  I cautioned the pt on symptoms/signs to look out for that would indicate infection as above.   -referral to podiatry

## 2014-01-26 LAB — MICROALBUMIN / CREATININE URINE RATIO
Creatinine, Urine: 212.2 mg/dL
Microalb Creat Ratio: 8.9 mg/g (ref 0.0–30.0)
Microalb, Ur: 1.89 mg/dL (ref 0.00–1.89)

## 2014-01-30 NOTE — Progress Notes (Signed)
Case discussed with Dr. Gill at the time of the visit.  We reviewed the resident's history and exam and pertinent patient test results.  I agree with the assessment, diagnosis, and plan of care documented in the resident's note. 

## 2014-02-22 ENCOUNTER — Telehealth: Payer: Self-pay | Admitting: *Deleted

## 2014-02-22 ENCOUNTER — Ambulatory Visit (INDEPENDENT_AMBULATORY_CARE_PROVIDER_SITE_OTHER): Payer: BC Managed Care – PPO | Admitting: Internal Medicine

## 2014-02-22 ENCOUNTER — Ambulatory Visit: Payer: Self-pay | Admitting: Podiatry

## 2014-02-22 ENCOUNTER — Encounter: Payer: Self-pay | Admitting: Internal Medicine

## 2014-02-22 VITALS — BP 142/77 | HR 69 | Temp 97.6°F | Wt 302.8 lb

## 2014-02-22 DIAGNOSIS — M25579 Pain in unspecified ankle and joints of unspecified foot: Secondary | ICD-10-CM

## 2014-02-22 DIAGNOSIS — M25571 Pain in right ankle and joints of right foot: Secondary | ICD-10-CM | POA: Insufficient documentation

## 2014-02-22 MED ORDER — IBUPROFEN 400 MG PO TABS: 400.0000 mg | ORAL_TABLET | Freq: Three times a day (TID) | ORAL | Status: DC | PRN

## 2014-02-22 NOTE — Progress Notes (Signed)
Attending note: I reviewed the presenting complaints, physical findings, and medications with resident physician Dr. Dortha Schwalbe, and I concur with his management. Murriel Hopper, MD, [place]  Hematology-Oncology/Internal Medicine

## 2014-02-22 NOTE — Assessment & Plan Note (Signed)
Patient underwent reconstructive surgery at [facility] when she was 43 yrs old. X-rays from 2007 reveal subtalar fusion of right calcaneus, talus, cuboid, tarsal, navicular bones. Patient also has very noticeable right flat foot. 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 Discussed the case with the attending and here the plans.  Plans: Scheduled an appointment with an ankle surgeon in may and she will be informed if they have an earlier opening available. Conservative management till she gets ortho opinion. Topical ice cube application to help with swelling and pain. Ankle braces while at work. Ibuprofen 400 mg to help with inflammation and pain. Follow up prn

## 2014-02-22 NOTE — Progress Notes (Signed)
Attending physician note: I reviewed the presenting complaints, physical findings, and medication list with resident physician Dr. Carter Kitten and I concur with his management. Murriel Hopper, MD, [place]  Hematology-Oncology/Internal Medicine

## 2014-02-22 NOTE — Telephone Encounter (Signed)
Pt stopped by [facility] - right foot swollen past few days - now painful. Age [facility] did surgery right foot and has screws in foot. Problems walking. Appt today 1:15PM Dr Eyvonne Mechanic. Pt aware. Hilda Blades Maliea Grandmaison RN 02/22/14 11:30AM

## 2014-02-22 NOTE — Patient Instructions (Signed)
Use ankle braces during the day time while working. Wrap some ice in a damp cloth e.g. tea towel. Place a dry cloth on the affected area and then apply the ice for 10-15 minutes Elevate your right leg above the heart level while sleeping. Follow up with orthopedic surgeon as advised. Take Ibuprofen 400mg  1 tablets three times daily as needed for pain.
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
## 2014-03-13 ENCOUNTER — Ambulatory Visit (INDEPENDENT_AMBULATORY_CARE_PROVIDER_SITE_OTHER): Payer: BC Managed Care – PPO | Admitting: Podiatry

## 2014-03-13 ENCOUNTER — Encounter: Payer: Self-pay | Admitting: Podiatry

## 2014-03-13 VITALS — BP 143/85 | HR 89 | Resp 17 | Ht 76.0 in | Wt 302.0 lb

## 2014-03-13 DIAGNOSIS — L259 Unspecified contact dermatitis, unspecified cause: Secondary | ICD-10-CM

## 2014-03-13 NOTE — Progress Notes (Signed)
   Subjective:    [name], female    DOB: 04-22-1971, 43 y.o.   MRN: [id number]  HPI Comments: N fissure L left medial heel D over 58month O  C darkened, hard skin with fissure A diabetes T hot water epsom salt soaks, hot water and H2O2, cocoa butter or vaseline then wrap with gauze.   She says that her new primary care physician requested a podiatric examination  She's also complaining of right ankle pain. She said a history of surgical fusion the right ankle area multiple years ago.   Review of Systems  Musculoskeletal: Positive for arthralgias and gait problem.  All other systems reviewed and are negative.      Objective:   Physical Exam  Orientated x3 black female  Vascular: DP and PT pulses 2/4 bilaterally  Neurological: Sensation to 10 g monofilament wire intact 5/5 bilaterally Ankle reflexes reactive bilaterally Vibratory sensation nonreactive right, reactive left  Dermatological: The left medial heel demonstrates a fissure without any surrounding erythema, edema or drainage. The surrounding tissues hyperpigmented. Plantar keratoses left noted. Surgical scar lateral right ankle  Musculoskeletal: There is no motion in the right subtalar joint. The left subtalar joint motion within normal limits HAV deformities noted bilaterally Hammertoe deformities right and left noted        Assessment & Plan:   Assessment: Satisfactory neurovascular status Fissure left left heel without clinical sign of infection Surgical fusion of right subtalar joint resulting in weight and force transfer to the ankle area causing pain in the area.  Plan: Patient is advised not to use hydrogen peroxide to the fissured area on the left heel any further. Wash feet with baby soap.  apply Vaseline to the fissure on the left heel  twice a day and cover with a cotton sock.  Also, made patient aware that the right ankle pain was most likely associated with her  right subtalar joint fusion

## 2014-03-13 NOTE — Patient Instructions (Signed)
Apply Vaseline twice a day to the skin on the left heel and wear a white cotton sock. Purchase a running or walking shoe. Discontinue the use of hydrogen peroxide and washed left heel skin with all-purpose nonmedicated bar soap or Johnson's baby soap daily.  Diabetes and Foot Care Diabetes may cause you to have problems because of poor blood supply (circulation) to your feet and legs. This may cause the skin on your feet to become thinner, break easier, and heal more slowly. Your skin may become dry, and the skin may peel and crack. You may also have nerve damage in your legs and feet causing decreased feeling in them. You may not notice minor injuries to your feet that could lead to infections or more serious problems. Taking care of your feet is one of the most important things you can do for yourself.  HOME CARE INSTRUCTIONS  Wear shoes at all times, even in the house. Do not go barefoot. Bare feet are easily injured.  Check your feet daily for blisters, cuts, and redness. If you cannot see the bottom of your feet, use a mirror or ask someone for help.  Wash your feet with warm water (do not use hot water) and mild soap. Then pat your feet and the areas between your toes until they are completely dry. Do not soak your feet as this can dry your skin.  Apply a moisturizing lotion or petroleum jelly (that does not contain alcohol and is unscented) to the skin on your feet and to dry, brittle toenails. Do not apply lotion between your toes.  Trim your toenails straight across. Do not dig under them or around the cuticle. File the edges of your nails with an emery board or nail file.  Do not cut corns or calluses or try to remove them with medicine.  Wear clean socks or stockings every day. Make sure they are not too tight. Do not wear knee-high stockings since they may decrease blood flow to your legs.  Wear shoes that fit properly and have enough cushioning. To break in new shoes, wear them for  just a few hours a day. This prevents you from injuring your feet. Always look in your shoes before you put them on to be sure there are no objects inside.  Do not cross your legs. This may decrease the blood flow to your feet.  If you find a minor scrape, cut, or break in the skin on your feet, keep it and the skin around it clean and dry. These areas may be cleansed with mild soap and water. Do not cleanse the area with peroxide, alcohol, or iodine.  When you remove an adhesive bandage, be sure not to damage the skin around it.  If you have a wound, look at it several times a day to make sure it is healing.  Do not use heating pads or hot water bottles. They may burn your skin. If you have lost feeling in your feet or legs, you may not know it is happening until it is too late.  Make sure your health care provider performs a complete foot exam at least annually or more often if you have foot problems. Report any cuts, sores, or bruises to your health care provider immediately. SEEK MEDICAL CARE IF:   You have an injury that is not healing.  You have cuts or breaks in the skin.  You have an ingrown nail.  You notice redness on your legs or feet.  You feel burning or tingling in your legs or feet.  You have pain or cramps in your legs and feet.  Your legs or feet are numb.  Your feet always feel cold. SEEK IMMEDIATE MEDICAL CARE IF:   There is increasing redness, swelling, or pain in or around a wound.  There is a red line that goes up your leg.  Pus is coming from a wound.  You develop a fever or as directed by your health care provider.  You notice a bad smell coming from an ulcer or wound. Document Released: 10/17/2000 Document Revised: 06/22/2013 Document Reviewed: 03/29/2013 [facility] Patient Information 2014 [place].

## 2014-03-14 ENCOUNTER — Encounter: Payer: Self-pay | Admitting: Podiatry

## 2014-05-23 NOTE — Telephone Encounter (Signed)
A user error has taken place: encounter opened in error, closed for administrative reasons.

## 2014-07-31 NOTE — Addendum Note (Signed)
Addended by: Hulan Fray on: 07/31/2014 08:07 PM   Modules accepted: Orders

## 2014-08-04 ENCOUNTER — Encounter: Payer: Self-pay | Admitting: Internal Medicine

## 2014-08-07 ENCOUNTER — Other Ambulatory Visit: Payer: Self-pay | Admitting: Internal Medicine

## 2014-08-07 MED ORDER — ENALAPRIL MALEATE 5 MG PO TABS: ORAL_TABLET | ORAL | Status: DC

## 2014-08-09 ENCOUNTER — Encounter: Payer: Self-pay | Admitting: *Deleted

## 2014-08-21 ENCOUNTER — Ambulatory Visit (INDEPENDENT_AMBULATORY_CARE_PROVIDER_SITE_OTHER): Payer: BC Managed Care – PPO | Admitting: Dietician

## 2014-08-21 ENCOUNTER — Ambulatory Visit (INDEPENDENT_AMBULATORY_CARE_PROVIDER_SITE_OTHER): Payer: BC Managed Care – PPO | Admitting: Internal Medicine

## 2014-08-21 ENCOUNTER — Encounter: Payer: Self-pay | Admitting: Internal Medicine

## 2014-08-21 VITALS — BP 139/69 | HR 92 | Temp 98.5°F | Wt 298.2 lb

## 2014-08-21 DIAGNOSIS — E119 Type 2 diabetes mellitus without complications: Secondary | ICD-10-CM | POA: Diagnosis not present

## 2014-08-21 DIAGNOSIS — Z0000 Encounter for general adult medical examination without abnormal findings: Secondary | ICD-10-CM

## 2014-08-21 DIAGNOSIS — I10 Essential (primary) hypertension: Secondary | ICD-10-CM

## 2014-08-21 DIAGNOSIS — M25571 Pain in right ankle and joints of right foot: Secondary | ICD-10-CM

## 2014-08-21 DIAGNOSIS — E785 Hyperlipidemia, unspecified: Secondary | ICD-10-CM | POA: Diagnosis not present

## 2014-08-21 LAB — POCT GLYCOSYLATED HEMOGLOBIN (HGB A1C): HEMOGLOBIN A1C: 7

## 2014-08-21 LAB — HM DIABETES EYE EXAM

## 2014-08-21 LAB — GLUCOSE, CAPILLARY: Glucose-Capillary: 138 mg/dL — ABNORMAL HIGH (ref 70–99)

## 2014-08-21 NOTE — Assessment & Plan Note (Addendum)
-  check lipid panel -pt reports being unable to afford pravastatin but it is the least expensive statin -will advise at next OV to inquire at the pharmacy what statin may be more affordable

## 2014-08-21 NOTE — Assessment & Plan Note (Signed)
BP Readings from Last 3 Encounters:  08/21/14 139/69  03/13/14 143/85  02/22/14 142/77    Lab Results  Component Value Date   NA 139 01/25/2014   K 4.2 01/25/2014   CREATININE 0.65 01/25/2014    Assessment: Blood pressure control: controlled Progress toward BP goal:  at goal  Plan: Medications:  continue current medications of enalapril 5mg  daily  Discussed smoking cessation

## 2014-08-21 NOTE — Assessment & Plan Note (Signed)
-  referral back to orthopedic surgeon (she could not afford copay of $40 in May)
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
## 2014-08-21 NOTE — Assessment & Plan Note (Addendum)
Lab Results  Component Value Date   HGBA1C 7.0 08/21/2014   HGBA1C 6.4 01/25/2014   HGBA1C 6.3 06/20/2013     Assessment: Diabetes control: good control (HgbA1C at goal) Progress toward A1C goal:  at goal  Plan: Medications:  continue current medications of metformin 500mg  twice daily  Retinal scan today

## 2014-08-21 NOTE — Assessment & Plan Note (Addendum)
-  needs pap smear, she will make a separate clinic appt in the next 6 months for this  -retinal scan today  -declined influenza vaccine today  -provided work excuse

## 2014-08-21 NOTE — Patient Instructions (Addendum)
Thank you for your visit today.   Please return to the internal medicine clinic in 6 month(s) or sooner if needed.   You will also need to schedule a separate appt in the next 6 months for a pap smear.     Your current medical regimen is effective;  continue present plan and take all medications as prescribed.    Please be sure to bring all of your medications with you to every visit; this includes herbal supplements, vitamins, eye drops, and any over-the-counter medications.   Should you have any questions regarding your medications and/or any new or worsening symptoms, please be sure to call the clinic at [phone].   If you believe that you are suffering from a life threatening condition or one that may result in the loss of limb or function, then you should call 911 or proceed to the nearest Emergency Department.     A healthy lifestyle and preventative care can promote health and wellness.   Maintain regular health, dental, and eye exams.  Eat a healthy diet. Foods like vegetables, fruits, whole grains, low-fat dairy products, and lean protein foods contain the nutrients you need without too many calories. Decrease your intake of foods high in solid fats, added sugars, and salt. Get information about a proper diet from your caregiver, if necessary.  Regular physical exercise is one of the most important things you can do for your health. Most adults should get at least 150 minutes of moderate-intensity exercise (any activity that increases your heart rate and causes you to sweat) each week. In addition, most adults need muscle-strengthening exercises on 2 or more days a week.   Maintain a healthy weight. The body mass index (BMI) is a screening tool to identify possible weight problems. It provides an estimate of body fat based on height and weight. Your caregiver can help determine your BMI, and can help you achieve or maintain a healthy weight. For adults 20 years and older:  A  BMI below 18.5 is considered underweight.  A BMI of 18.5 to 24.9 is normal.  A BMI of 25 to 29.9 is considered overweight.  A BMI of 30 and above is considered obese.

## 2014-08-22 LAB — LIPID PANEL
Cholesterol: 220 mg/dL — ABNORMAL HIGH (ref 0–200)
HDL: 28 mg/dL — AB (ref >39)
LDL CALC: 142 mg/dL — AB (ref 0–99)
TRIGLYCERIDES: 250 mg/dL — AB (ref <150)
Total CHOL/HDL Ratio: 7.9 Ratio
VLDL: 50 mg/dL — AB (ref 0–40)

## 2014-08-22 NOTE — Progress Notes (Signed)
Case discussed with Dr. Gill soon after the resident saw the patient.  We reviewed the resident's history and exam and pertinent patient test results.  I agree with the assessment, diagnosis, and plan of care documented in the resident's note. 

## 2014-08-28 ENCOUNTER — Encounter: Payer: Self-pay | Admitting: Dietician

## 2014-08-28 ENCOUNTER — Telehealth: Payer: Self-pay | Admitting: Dietician

## 2014-08-28 NOTE — Telephone Encounter (Signed)
Unable to reach patient by phone. Both numbers incorrect.

## 2014-08-28 NOTE — Telephone Encounter (Signed)
Patient called back and said her mother had answered the first time. She says she was offered tobacco counseling at her last visit.

## 2014-09-04 ENCOUNTER — Encounter: Payer: Self-pay | Admitting: Dietician

## 2014-10-06 ENCOUNTER — Ambulatory Visit (INDEPENDENT_AMBULATORY_CARE_PROVIDER_SITE_OTHER): Payer: BC Managed Care – PPO | Admitting: Internal Medicine

## 2014-10-06 ENCOUNTER — Encounter: Payer: Self-pay | Admitting: Internal Medicine

## 2014-10-06 VITALS — BP 151/70 | HR 93 | Temp 98.3°F | Ht 76.0 in | Wt 298.3 lb

## 2014-10-06 DIAGNOSIS — J029 Acute pharyngitis, unspecified: Secondary | ICD-10-CM | POA: Insufficient documentation

## 2014-10-06 DIAGNOSIS — E119 Type 2 diabetes mellitus without complications: Secondary | ICD-10-CM

## 2014-10-06 LAB — GLUCOSE, CAPILLARY: GLUCOSE-CAPILLARY: 157 mg/dL — AB (ref 70–99)

## 2014-10-06 MED ORDER — BENZOCAINE-PECTIN 15-5 MG MT LOZG: 1.0000 | LOZENGE | OROMUCOSAL | Status: DC | PRN

## 2014-10-06 MED ORDER — ESOMEPRAZOLE MAGNESIUM 20 MG PO CPDR: 20.0000 mg | DELAYED_RELEASE_CAPSULE | Freq: Every day | ORAL | Status: DC

## 2014-10-06 MED ORDER — FLUTICASONE PROPIONATE 50 MCG/ACT NA SUSP: 2.0000 | Freq: Every day | NASAL | Status: DC
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
## 2014-10-06 NOTE — Progress Notes (Signed)
Internal Medicine Clinic Attending  Case discussed with Dr. Jones at the time of the visit.  We reviewed the resident's history and exam and pertinent patient test results.  I agree with the assessment, diagnosis, and plan of care documented in the resident's note.  

## 2014-10-06 NOTE — Patient Instructions (Addendum)
General Instructions:  The symptoms you are having at likely due to a viral infection, reflux symptoms, or post-nasal drip from your sinuses.   1. Please schedule a follow up appointment for 1-2 weeks.   2. Please take all medications as prescribed.   Start taking Nexium 20 mg daily for reflux symptoms.   Use Cepacol throat lozenges for throat relief.   Use Flonase nasal spray for your sinus congestion.  3. If you have worsening of your symptoms or new symptoms arise, please call the clinic ([phone]), or go to the ER immediately if symptoms are severe.  You have done a great job in taking all your medications. I appreciate it very much. Please continue doing that.   Please bring your medicines with you each time you come to clinic.  Medicines may include prescription medications, over-the-counter medications, herbal remedies, eye drops, vitamins, or other pills.   Progress Toward Treatment Goals:  Treatment Goal 10/06/2014  Hemoglobin A1C at goal  Blood pressure unchanged  Stop smoking unable to assess    Self Care Goals & Plans:  Self Care Goal 10/06/2014  Manage my medications take my medicines as prescribed; bring my medications to every visit; refill my medications on time  Monitor my health keep track of my blood glucose  Eat healthy foods -  Be physically active -  Stop smoking -  Meeting treatment goals maintain the current self-care plan    Home Blood Glucose Monitoring 10/06/2014  Check my blood sugar once a day  When to check my blood sugar before breakfast     Care Management & Community Referrals:  Referral 10/06/2014  Referrals made for care management support none needed

## 2014-10-06 NOTE — Assessment & Plan Note (Signed)
Patient describes reflux symptoms and mild globus sensation w/ recent associated sore throat. She also describes recent sick contacts w/ cold-like symptoms in addition to recent/chronic sinusitis. Suspect that the patient's symptoms are mostly associated w/ reflux given her description of her burning pain in the lower throat. Of note, no chest pain, SOB, or radiation of pain, DO NOT suspect atypical presentation of ACS. Also possible is a viral pharyngitis; do not suspect strep pharyngitis in the absence of tonsillar exudates, lymphadenopathy, fever, or pharyngeal erythema. Lastly, possibly associated w/ post-nasal drip in the setting of sinusitis.  -Start Nexium 20 mg daily for reflux symptoms -Start Flonase nasal spray daily for sinusitis -Give Cepacol throat lozenges for symptomatic management -Patient to return in 1-2 weeks for follow up. Instructed to return sooner if new onset of fever, cough, or significant malaise.

## 2014-10-09 ENCOUNTER — Telehealth: Payer: Self-pay

## 2014-12-19 ENCOUNTER — Other Ambulatory Visit: Payer: Self-pay | Admitting: Internal Medicine

## 2014-12-28 ENCOUNTER — Encounter: Payer: Self-pay | Admitting: Internal Medicine

## 2014-12-28 ENCOUNTER — Ambulatory Visit (INDEPENDENT_AMBULATORY_CARE_PROVIDER_SITE_OTHER): Payer: BLUE CROSS/BLUE SHIELD | Admitting: Internal Medicine

## 2014-12-28 VITALS — BP 140/67 | HR 89 | Temp 98.2°F | Wt 299.7 lb

## 2014-12-28 DIAGNOSIS — S90822A Blister (nonthermal), left foot, initial encounter: Secondary | ICD-10-CM

## 2014-12-28 NOTE — Patient Instructions (Signed)
General Instructions:   Please try to bring all your medicines next time. This will help [place] keep you safe from mistakes.   Progress Toward Treatment Goals:  Treatment Goal 10/06/2014  Hemoglobin A1C at goal  Blood pressure unchanged  Stop smoking unable to assess    Self Care Goals & Plans:  Self Care Goal 10/06/2014  Manage my medications take my medicines as prescribed; bring my medications to every visit; refill my medications on time  Monitor my health keep track of my blood glucose  Eat healthy foods -  Be physically active -  Stop smoking -  Meeting treatment goals maintain the current self-care plan    Home Blood Glucose Monitoring 10/06/2014  Check my blood sugar once a day  When to check my blood sugar before breakfast     Care Management & Community Referrals:  Referral 10/06/2014  Referrals made for care management support none needed
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
## 2014-12-29 DIAGNOSIS — S90822A Blister (nonthermal), left foot, initial encounter: Secondary | ICD-10-CM | POA: Insufficient documentation

## 2014-12-29 NOTE — Assessment & Plan Note (Signed)
Very clean appearing exposed area of skin likely secondary to a blister that was spontaneously opened likely in the setting of the new silicone pad given by her podiatrist. No signs of infection at this time in regards to the wound or systemic signs. Therefore no antibiotics seem to be warranted at this time. -Continue conservative management patient was educated on any warning signs such as development of any pain, erythema, or pus should warrant reevaluation as soon as possible -Continue to follow-up with podiatry in regards to new orthotics that may help prevent this or other options instead of the silicon pad

## 2015-01-02 NOTE — Progress Notes (Signed)
Internal Medicine Clinic Attending  Case discussed with Dr. Sadek at the time of the visit.  We reviewed the resident's history and exam and pertinent patient test results.  I agree with the assessment, diagnosis, and plan of care documented in the resident's note.  

## 2015-01-02 NOTE — Addendum Note (Signed)
Addended by: Gilles Chiquito B on: 01/02/2015 10:11 AM   Modules accepted: Level of Service

## 2015-01-19 ENCOUNTER — Other Ambulatory Visit: Payer: Self-pay | Admitting: Internal Medicine

## 2015-03-26 ENCOUNTER — Ambulatory Visit (INDEPENDENT_AMBULATORY_CARE_PROVIDER_SITE_OTHER): Payer: BLUE CROSS/BLUE SHIELD | Admitting: Internal Medicine

## 2015-03-26 ENCOUNTER — Encounter: Payer: Self-pay | Admitting: Internal Medicine

## 2015-03-26 VITALS — BP 150/81 | HR 93 | Temp 98.1°F | Ht 76.0 in | Wt 279.0 lb

## 2015-03-26 DIAGNOSIS — E1165 Type 2 diabetes mellitus with hyperglycemia: Secondary | ICD-10-CM | POA: Diagnosis not present

## 2015-03-26 DIAGNOSIS — E860 Dehydration: Secondary | ICD-10-CM | POA: Diagnosis not present

## 2015-03-26 DIAGNOSIS — Z794 Long term (current) use of insulin: Secondary | ICD-10-CM

## 2015-03-26 DIAGNOSIS — R739 Hyperglycemia, unspecified: Secondary | ICD-10-CM

## 2015-03-26 DIAGNOSIS — E119 Type 2 diabetes mellitus without complications: Secondary | ICD-10-CM

## 2015-03-26 LAB — BASIC METABOLIC PANEL
ANION GAP: 11 (ref 5–15)
BUN: 9 mg/dL (ref 6–20)
CO2: 24 mmol/L (ref 22–32)
CREATININE: 0.75 mg/dL (ref 0.44–1.00)
Calcium: 9.1 mg/dL (ref 8.9–10.3)
Chloride: 96 mmol/L — ABNORMAL LOW (ref 101–111)
Glucose, Bld: 406 mg/dL — ABNORMAL HIGH (ref 65–99)
Potassium: 3.9 mmol/L (ref 3.5–5.1)
Sodium: 131 mmol/L — ABNORMAL LOW (ref 135–145)

## 2015-03-26 LAB — GLUCOSE, CAPILLARY
Glucose-Capillary: 418 mg/dL — ABNORMAL HIGH (ref 65–99)
Glucose-Capillary: 284 mg/dL — ABNORMAL HIGH (ref 65–99)

## 2015-03-26 LAB — POCT GLYCOSYLATED HEMOGLOBIN (HGB A1C): Hemoglobin A1C: 10.7

## 2015-03-26 MED ORDER — INSULIN ASPART 100 UNIT/ML ~~LOC~~ SOLN
15.0000 [IU] | Freq: Once | SUBCUTANEOUS | Status: AC
  Administered 2015-03-26: 15 [IU] via SUBCUTANEOUS

## 2015-03-26 MED ORDER — SODIUM CHLORIDE 0.9 % IV BOLUS (SEPSIS)
1000.0000 mL | Freq: Once | INTRAVENOUS | Status: AC
  Administered 2015-03-26: 1000 mL via INTRAVENOUS
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
## 2015-03-26 NOTE — Assessment & Plan Note (Signed)
Lab Results  Component Value Date   HGBA1C 10.7 03/26/2015   HGBA1C 7.0 08/21/2014   HGBA1C 6.4 01/25/2014     Assessment: Diabetes control:   Progress toward A1C goal:    Comments: previously excellent control  Plan: Medications:  Pt had changed metformin to 1000mg  once daily and had extensive dietary indescrition eating fruit rolls at work>>change to metformin 500mg  in AM and the 1000mg  PM and uptitrate in 1 wk to 1000mg  BID as previously prescribed Home glucose monitoring: Frequency:   Timing:   Instruction/counseling given: reminded to bring blood glucose meter & log to each visit, reminded to bring medications to each visit and discussed diet Educational resources provided: brochure, handout Self management tools provided:   Other plans: f/u in 1 month

## 2015-03-26 NOTE — Assessment & Plan Note (Addendum)
Pt with initial CBG of 460s and symptoms of poorly controlled DM. Pt has been recently dehydrated as possible inciting event. Stat Bmet showed a gap of 11. No hemodynamic instability.  -15 units of novolog and 1000 mL NS bolus provided in clinic Recheck of CBG 284 and pt felt better

## 2015-03-26 NOTE — Patient Instructions (Signed)
General Instructions:   Please bring your medicines with you each time you come to clinic.  Medicines may include prescription medications, over-the-counter medications, herbal remedies, eye drops, vitamins, or other pills.   For your diabetes start taking 500mg  in the morning and 1000mg  at night try to work up to the 1000mg  twice a day it will help prevent needing insulin because you were very well controlled before.   See you back in 1 month to recheck    Progress Toward Treatment Goals:  Treatment Goal 10/06/2014  Hemoglobin A1C at goal  Blood pressure unchanged  Stop smoking unable to assess    Self Care Goals & Plans:  Self Care Goal 03/26/2015  Manage my medications take my medicines as prescribed; bring my medications to every visit; refill my medications on time; follow the sick day instructions if I am sick  Monitor my health check my feet daily  Eat healthy foods eat more vegetables; eat fruit for snacks and desserts; eat foods that are low in salt; eat baked foods instead of fried foods; eat smaller portions; drink diet soda or water instead of juice or soda  Be physically active find an activity I enjoy  Stop smoking -  Meeting treatment goals -    Home Blood Glucose Monitoring 10/06/2014  Check my blood sugar once a day  When to check my blood sugar before breakfast     Care Management & Community Referrals:  Referral 10/06/2014  Referrals made for care management support none needed

## 2015-03-27 NOTE — Progress Notes (Signed)
Internal Medicine Clinic Attending  Case discussed with Dr. Sadek soon after the resident saw the patient.  We reviewed the resident's history and exam and pertinent patient test results.  I agree with the assessment, diagnosis, and plan of care documented in the resident's note. 

## 2015-04-12 ENCOUNTER — Encounter: Payer: Self-pay | Admitting: Internal Medicine

## 2015-04-12 ENCOUNTER — Ambulatory Visit (INDEPENDENT_AMBULATORY_CARE_PROVIDER_SITE_OTHER): Payer: BLUE CROSS/BLUE SHIELD | Admitting: Internal Medicine

## 2015-04-12 VITALS — BP 134/71 | HR 92 | Temp 98.1°F | Ht 76.0 in | Wt 275.7 lb

## 2015-04-12 DIAGNOSIS — E119 Type 2 diabetes mellitus without complications: Secondary | ICD-10-CM

## 2015-04-12 MED ORDER — GLUCOSE BLOOD VI STRP: ORAL_STRIP | Status: DC

## 2015-04-12 MED ORDER — ONETOUCH DELICA LANCETS FINE MISC: Status: DC

## 2015-04-12 MED ORDER — GLIPIZIDE 5 MG PO TABS: 5.0000 mg | ORAL_TABLET | Freq: Two times a day (BID) | ORAL | Status: DC

## 2015-04-12 NOTE — Patient Instructions (Signed)
1. Schedule a follow up appointment for 2 weeks.   2. Please take all medications as previously prescribed with the following changes:  Take Metformin 1000 mg once daily  Start taking Glipizide 5 mg every morning with a meal. DO NOT take this without eating something as it can make your sugar go low.   Check your blood sugars at least once every morning before you eat.   3. If you have worsening of your symptoms or new symptoms arise, please call the clinic ([phone]), or go to the ER immediately if symptoms are severe.  You have done a great job in taking all your medications. Please continue to do this.
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
## 2015-04-13 NOTE — Assessment & Plan Note (Signed)
Lab Results  Component Value Date   HGBA1C 10.7 03/26/2015   HGBA1C 7.0 08/21/2014   HGBA1C 6.4 01/25/2014     Assessment: Diabetes control:  Deteriorated Comments: Patient w/ relatively recent worsening in blood sugar control. Seen on 03/26/15 w/ CBG's in the 400's given NS bolus + Novolog. Sent home w/ increased dose of Metformin 1000 mg bid (slow tapered increase). Patient has not tolerated this well given the GI side effects. Still w/ CBG in the 200's today. Does not check blood sugars at home as she does not have a meter.   Plan: Medications:  Continue current medications, Continue w/ initial Metformin dose; 500 mg bid. Start Glipizide 5 mg qAM w/ breakfast.  Home glucose monitoring: Frequency:  Patient to start checking blood sugar once a day Timing:  qAM prior to breakfast.  Instruction/counseling given: reminded to bring blood glucose meter & log to each visit, reminded to bring medications to each visit, discussed the need for weight loss and discussed diet Educational resources provided: brochure (denies) Self management tools provided:   Other plans: Patient given a meter from [employer] . Appreciate assistance. Also discussed diet and carbohydrate intake. -Patient will very likely need increase in dose/frequency of Glipizide vs restart Lantus daily (had taken it in the distant past).

## 2015-04-13 NOTE — Progress Notes (Signed)
Medicine attending: Medical history, presenting problems, physical findings, and medications, reviewed with Dr Eden Jones and I concur with his evaluation and management plan. 

## 2015-04-16 LAB — GLUCOSE, CAPILLARY: GLUCOSE-CAPILLARY: 273 mg/dL — AB (ref 65–99)

## 2015-05-18 ENCOUNTER — Ambulatory Visit (INDEPENDENT_AMBULATORY_CARE_PROVIDER_SITE_OTHER): Payer: BLUE CROSS/BLUE SHIELD | Admitting: Internal Medicine

## 2015-05-18 ENCOUNTER — Encounter: Payer: Self-pay | Admitting: Internal Medicine

## 2015-05-18 VITALS — BP 133/63 | HR 79 | Temp 98.2°F | Ht 76.0 in | Wt 282.3 lb

## 2015-05-18 DIAGNOSIS — M5417 Radiculopathy, lumbosacral region: Secondary | ICD-10-CM

## 2015-05-18 DIAGNOSIS — M5441 Lumbago with sciatica, right side: Secondary | ICD-10-CM

## 2015-05-18 MED ORDER — MELOXICAM 7.5 MG PO TABS: 7.5000 mg | ORAL_TABLET | Freq: Every day | ORAL | Status: DC

## 2015-05-18 MED ORDER — CYCLOBENZAPRINE HCL 5 MG PO TABS: 5.0000 mg | ORAL_TABLET | Freq: Three times a day (TID) | ORAL | Status: DC | PRN

## 2015-05-18 NOTE — Patient Instructions (Signed)
Please return to clinic if your symptoms do not improve in 2-3 weeks  We have prescribed you the medications Thank you for the visit today.  Sciatica Sciatica is pain, weakness, numbness, or tingling along your sciatic nerve. The nerve starts in the lower back and runs down the back of each leg. Nerve damage or certain conditions pinch or put pressure on the sciatic nerve. This causes the pain, weakness, and other discomforts of sciatica. HOME CARE   Only take medicine as told by your doctor.  Apply ice to the affected area for 20 minutes. Do this 3-4 times a day for the first 48-72 hours. Then try heat in the same way.  Exercise, stretch, or do your usual activities if these do not make your pain worse.  Go to physical therapy as told by your doctor.  Keep all doctor visits as told.  Do not wear high heels or shoes that are not supportive.  Get a firm mattress if your mattress is too soft to lessen pain and discomfort. GET HELP RIGHT AWAY IF:   You cannot control when you poop (bowel movement) or pee (urinate).  You have more weakness in your lower back, lower belly (pelvis), butt (buttocks), or legs.  You have redness or puffiness (swelling) of your back.  You have a burning feeling when you pee.  You have pain that gets worse when you lie down.  You have pain that wakes you from your sleep.  Your pain is worse than past pain.  Your pain lasts longer than 4 weeks.  You are suddenly losing weight without reason. MAKE SURE YOU:   Understand these instructions.  Will watch this condition.  Will get help right away if you are not doing well or get worse. Document Released: 07/29/2008 Document Revised: 04/20/2012 Document Reviewed: 02/29/2012 [facility] Patient Information 2015 [place], [place]. This information is not intended to replace advice given to you by your health care provider. Make sure you discuss any questions you have with your health care provider.   Back  Injury Prevention Back injuries can be extremely painful and difficult to heal. After having one back injury, you are much more likely to experience another later on. It is important to learn how to avoid injuring or re-injuring your back. The following tips can help you to prevent a back injury. PHYSICAL FITNESS  Exercise regularly and try to develop good tone in your abdominal muscles. Your abdominal muscles provide a lot of the support needed by your back.  Do aerobic exercises (walking, jogging, biking, swimming) regularly.  Do exercises that increase balance and strength (tai chi, yoga) regularly. This can decrease your risk of falling and injuring your back.  Stretch before and after exercising.  Maintain a healthy weight. The more you weigh, the more stress is placed on your back. For every pound of weight, 10 times that amount of pressure is placed on the back. DIET  Talk to your caregiver about how much calcium and vitamin D you need per day. These nutrients help to prevent weakening of the bones (osteoporosis). Osteoporosis can cause broken (fractured) bones that lead to back pain.  Include good sources of calcium in your diet, such as dairy products, green, leafy vegetables, and products with calcium added (fortified).  Include good sources of vitamin D in your diet, such as milk and foods that are fortified with vitamin D.  Consider taking a nutritional supplement or a multivitamin if needed.  Stop smoking if you smoke.  POSTURE  Sit and stand up straight. Avoid leaning forward when you sit or hunching over when you stand.  Choose chairs with good low back (lumbar) support.  If you work at a desk, sit close to your work so you do not need to lean over. Keep your chin tucked in. Keep your neck drawn back and elbows bent at a right angle. Your arms should look like the letter "L."  Sit high and close to the steering wheel when you drive. Add a lumbar support to your car seat  if needed.  Avoid sitting or standing in one position for too long. Take breaks to get up, stretch, and walk around at least once every hour. Take breaks if you are driving for long periods of time.  Sleep on your side with your knees slightly bent, or sleep on your back with a pillow under your knees. Do not sleep on your stomach. LIFTING, TWISTING, AND REACHING  Avoid heavy lifting, especially repetitive lifting. If you must do heavy lifting:  Stretch before lifting.  Work slowly.  Rest between lifts.  Use carts and dollies to move objects when possible.  Make several small trips instead of carrying 1 heavy load.  Ask for help when you need it.  Ask for help when moving big, awkward objects.  Follow these steps when lifting:  Stand with your feet shoulder-width apart.  Get as close to the object as you can. Do not try to pick up heavy objects that are far from your body.  Use handles or lifting straps if they are available.  Bend at your knees. Squat down, but keep your heels off the floor.  Keep your shoulders pulled back, your chin tucked in, and your back straight.  Lift the object slowly, tightening the muscles in your legs, abdomen, and buttocks. Keep the object as close to the center of your body as possible.  When you put a load down, use these same guidelines in reverse.  Do not:  Lift the object above your waist.  Twist at the waist while lifting or carrying a load. Move your feet if you need to turn, not your waist.  Bend over without bending at your knees.  Avoid reaching over your head, across a table, or for an object on a high surface. OTHER TIPS  Avoid wet floors and keep sidewalks clear of ice to prevent falls.  Do not sleep on a mattress that is too soft or too hard.  Keep items that are used frequently within easy reach.  Put heavier objects on shelves at waist level and lighter objects on lower or higher shelves.  Find ways to decrease  your stress, such as exercise, massage, or relaxation techniques. Stress can build up in your muscles. Tense muscles are more vulnerable to injury.  Seek treatment for depression or anxiety if needed. These conditions can increase your risk of developing back pain. SEEK MEDICAL CARE IF:  You injure your back.  You have questions about diet, exercise, or other ways to prevent back injuries. MAKE SURE YOU:  Understand these instructions.  Will watch your condition.  Will get help right away if you are not doing well or get worse. Document Released: 11/27/2004 Document Revised: 01/12/2012 Document Reviewed: 12/01/2011 [facility] Patient Information 2015 [place], [place]. This information is not intended to replace advice given to you by your health care provider. Make sure you discuss any questions you have with your health care provider.

## 2015-05-18 NOTE — Progress Notes (Signed)
Internal Medicine Clinic Attending  I saw and evaluated the patient.  I personally confirmed the key portions of the history and exam documented by Dr. Tiburcio Pea and I reviewed pertinent patient test results.  The assessment, diagnosis, and plan were formulated together and I agree with the documentation in the resident's note.  44 year old female with acute lumbosacral radiculopathy presenting as acute back pain of 1 day duration with numbness and sciatica like sensation over lateral aspect of her right thigh upto mid thigh. Etiology likely trauma given history of sudden bending and hearing a pop like sound. No red flag symptoms yet. Ibuprofen is not relieving her pain. Exam - tenderness over L4-5 left paraspinal area. No motor strength changes.  -NSAIDs, muscle relaxants -activity moderation to avoid activities that exacerbate pain -Follow up in 1-2 weeks if no resolution. Rest per resident note. Madilyn Fireman MD MPH 05/18/2015 3:29 PM

## 2015-05-19 DIAGNOSIS — M549 Dorsalgia, unspecified: Secondary | ICD-10-CM | POA: Insufficient documentation
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
## 2015-05-22 ENCOUNTER — Ambulatory Visit (INDEPENDENT_AMBULATORY_CARE_PROVIDER_SITE_OTHER): Payer: BLUE CROSS/BLUE SHIELD | Admitting: Internal Medicine

## 2015-05-22 ENCOUNTER — Encounter: Payer: Self-pay | Admitting: Internal Medicine

## 2015-05-22 VITALS — BP 140/68 | HR 85 | Temp 98.7°F | Ht 76.0 in | Wt 280.0 lb

## 2015-05-22 DIAGNOSIS — M545 Low back pain: Secondary | ICD-10-CM

## 2015-05-22 DIAGNOSIS — M5441 Lumbago with sciatica, right side: Secondary | ICD-10-CM

## 2015-05-22 MED ORDER — MELOXICAM 7.5 MG PO TABS: 15.0000 mg | ORAL_TABLET | Freq: Every day | ORAL | Status: DC

## 2015-05-22 MED ORDER — KETOROLAC TROMETHAMINE 30 MG/ML IJ SOLN
30.0000 mg | Freq: Once | INTRAMUSCULAR | Status: AC
  Administered 2015-05-22: 30 mg via INTRAMUSCULAR

## 2015-05-22 MED ORDER — GABAPENTIN 300 MG PO CAPS: 300.0000 mg | ORAL_CAPSULE | Freq: Three times a day (TID) | ORAL | Status: DC

## 2015-05-22 NOTE — Progress Notes (Signed)
Internal Medicine Clinic Attending  I saw and evaluated the patient.  I personally confirmed the key portions of the history and exam documented by Dr. Zada Finders and I reviewed pertinent patient test results.  The assessment, diagnosis, and plan were formulated together and I agree with the documentation in the resident's note.  Patient seen a week back with Dr Tiburcio Pea comes back with worsening back pain, however no change in neurological symptoms. 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    Will give gabapentin 300mg  TID. Will make patient aware of risk of drowsiness.   Toradol injection in clinic.   Increase mobic to 15mg  daily.   Patient to call in 3-4 days if she has relief, next step could be tramadol for acute pain.   If pain does not let up in 1-2 weeks we will repeat MRI lumbar spine and make appropriate referral as needed.   Physical therapy to consider.  Madilyn Fireman MD MPH 05/22/2015 3:36 PM

## 2015-05-22 NOTE — Patient Instructions (Signed)
Thank you for your visit.  For your back pain, today we are giving you a Ketorolac (Toradol) shot. I have sent a prescription of Gabapentin (Neurontin) to your pharmacy. Be aware that gabapentin may cause drowsiness. You may also increase your Meloxicam (Mobic) to two pills for a total of 15 mg.  I have sent a referral to physical therapy.  I have also given you a return to work letter for Monday July 25th, 2016.  If your pain has not improved in 3 days, call us and we can send alternative medications to your pharmacy.
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
## 2015-05-23 LAB — GLUCOSE, CAPILLARY: GLUCOSE-CAPILLARY: 108 mg/dL — AB (ref 65–99)

## 2015-05-23 NOTE — Assessment & Plan Note (Signed)
A/P for 7/16 H&P consistent with acute paraspinal muscle spasm and some lumbosacral radiculopathy of 1 day duration We explained to her that since this was acute and without red flag symptoms, imaging would not really help at this point. However, if she continues to have these symptoms, or if they get worse or do not improve then she come in again in 1-2 weeks.   Plan: given mobic and flexeril. I explained that flexeril will make her drowsy so that she should not drive when she takes flexeril, and to initially only take it atnight. We told her that she can continue her routine activities but no strenuous activity or heavy lifting.   F/u PRN

## 2015-05-24 NOTE — Telephone Encounter (Signed)
error 

## 2015-06-04 ENCOUNTER — Telehealth: Payer: Self-pay | Admitting: Internal Medicine

## 2015-06-04 NOTE — Telephone Encounter (Signed)
Pt was seen in clinic on 7/15 & 7/19 with c/o back pain.  She was started on Mobic and gabapentin.  She has been taking both meds without relief. She states she was told if this did not help she could call in and get another medication.  ( Tramadol per Dr Delfino Lovett note ) Please advise Pt # [phone]

## 2015-06-04 NOTE — Telephone Encounter (Signed)
Pt requesting medicine to be filled.

## 2015-06-05 ENCOUNTER — Other Ambulatory Visit: Payer: Self-pay | Admitting: Internal Medicine

## 2015-06-06 ENCOUNTER — Telehealth: Payer: Self-pay | Admitting: Internal Medicine

## 2015-06-06 MED ORDER — TRAMADOL HCL 50 MG PO TABS: 50.0000 mg | ORAL_TABLET | Freq: Four times a day (QID) | ORAL | Status: DC | PRN

## 2015-06-06 NOTE — Telephone Encounter (Signed)
Spoke with patient and will give short supply of Tramadol for her pain. Advised her that should her pain not improve with this medication, she would need to come to clinic and have possible repeat MRI or other workup. She understands and agrees.  Patient was also referred for PT, but missed call when they tried to contact her. She states that she can return a voicemail if she misses a call and would like to get in contact with PT. Will try to have this resolved as she would benefit from PT.

## 2015-06-07 ENCOUNTER — Telehealth: Payer: Self-pay | Admitting: *Deleted

## 2015-06-07 NOTE — Telephone Encounter (Signed)
CALLED PATIENT LEFT VOICE MESSAGE FOR PATIENT TO CONTACT PT OFFICE AT [phone] TO SET UP APPOINTMENT . THEIR OFFICE HAD TRIED TO CONTACT HER.

## 2015-06-07 NOTE — Telephone Encounter (Signed)
Rx has been called in  

## 2015-06-07 NOTE — Telephone Encounter (Signed)
Rx called in PT referral followed up on.

## 2015-06-19 ENCOUNTER — Encounter: Payer: Self-pay | Admitting: *Deleted

## 2015-06-25 ENCOUNTER — Encounter: Payer: Self-pay | Admitting: Internal Medicine

## 2015-06-25 ENCOUNTER — Ambulatory Visit (INDEPENDENT_AMBULATORY_CARE_PROVIDER_SITE_OTHER): Payer: BLUE CROSS/BLUE SHIELD | Admitting: Internal Medicine

## 2015-06-25 VITALS — BP 125/54 | HR 81 | Temp 98.0°F | Ht 76.0 in | Wt 293.2 lb

## 2015-06-25 DIAGNOSIS — E785 Hyperlipidemia, unspecified: Secondary | ICD-10-CM

## 2015-06-25 DIAGNOSIS — E119 Type 2 diabetes mellitus without complications: Secondary | ICD-10-CM

## 2015-06-25 DIAGNOSIS — M5116 Intervertebral disc disorders with radiculopathy, lumbar region: Secondary | ICD-10-CM | POA: Diagnosis not present

## 2015-06-25 DIAGNOSIS — I10 Essential (primary) hypertension: Secondary | ICD-10-CM

## 2015-06-25 DIAGNOSIS — D509 Iron deficiency anemia, unspecified: Secondary | ICD-10-CM

## 2015-06-25 DIAGNOSIS — Z79899 Other long term (current) drug therapy: Secondary | ICD-10-CM

## 2015-06-25 LAB — GLUCOSE, CAPILLARY: GLUCOSE-CAPILLARY: 123 mg/dL — AB (ref 65–99)

## 2015-06-25 LAB — POCT GLYCOSYLATED HEMOGLOBIN (HGB A1C): HEMOGLOBIN A1C: 7.3

## 2015-06-25 MED ORDER — HYDROCODONE-ACETAMINOPHEN 5-325 MG PO TABS: 1.0000 | ORAL_TABLET | Freq: Three times a day (TID) | ORAL | Status: DC | PRN

## 2015-06-25 MED ORDER — PRAVASTATIN SODIUM 40 MG PO TABS
40.0000 mg | ORAL_TABLET | Freq: Every evening | ORAL | Status: DC
Start: 2015-06-25 — End: 2016-02-04

## 2015-06-25 MED ORDER — LIDOCAINE 5 % EX PTCH: 1.0000 | MEDICATED_PATCH | CUTANEOUS | Status: DC
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
## 2015-06-25 NOTE — Assessment & Plan Note (Signed)
-  check cbc

## 2015-06-25 NOTE — Patient Instructions (Signed)
Thank you for your visit today.   Please return to the internal medicine clinic in 3-4 month(s) or sooner if needed.     I have made the following additions/changes to your medications:  I have given you some vicodin to take intermittently for your back pain.  Please take this only as needed.  You will need to go to sports medicine for further management of your back pain.  I also prescribed a lidoderm patch which you can use at the site of the pain.  You may use this once daily.   Your HA1c is higher today at 7.3, please continue your current medications and try to lose weight.  Please be sure to bring all of your medications with you to every visit; this includes herbal supplements, vitamins, eye drops, and any over-the-counter medications.   Should you have any questions regarding your medications and/or any new or worsening symptoms, please be sure to call the clinic at [phone].   If you believe that you are suffering from a life threatening condition or one that may result in the loss of limb or function, then you should call 911 and proceed to the nearest Emergency Department.   A healthy lifestyle and preventative care can promote health and wellness.   Maintain regular health, dental, and eye exams.  Eat a healthy diet. Foods like vegetables, fruits, whole grains, low-fat dairy products, and lean protein foods contain the nutrients you need without too many calories. Decrease your intake of foods high in solid fats, added sugars, and salt. Get information about a proper diet from your caregiver, if necessary.  Regular physical exercise is one of the most important things you can do for your health. Most adults should get at least 150 minutes of moderate-intensity exercise (any activity that increases your heart rate and causes you to sweat) each week. In addition, most adults need muscle-strengthening exercises on 2 or more days a week.   Maintain a healthy weight. The body  mass index (BMI) is a screening tool to identify possible weight problems. It provides an estimate of body fat based on height and weight. Your caregiver can help determine your BMI, and can help you achieve or maintain a healthy weight. For adults 20 years and older:  A BMI below 18.5 is considered underweight.  A BMI of 18.5 to 24.9 is normal.  A BMI of 25 to 29.9 is considered overweight.  A BMI of 30 and above is considered obese.

## 2015-06-25 NOTE — Assessment & Plan Note (Addendum)
Pt still c/o LBP with radiation down both legs.  Denies any leg weakness or other red flags.  Was referred to PT at last OV but has not gotten in touch with them.  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  She states she does not want to be "drugged" and states tramadol and gabapentin have not worked and she is not taking them.  Ibuprofen also didn't work.  MRI 2005 showed minimal disc protrusion at L3-4 and L4-5.  Also moderate sized protrusion at L5-S1.   -referral to SM, may need repeat imaging  -prescribed short course of vicodin with the understanding that this is not a long term solution and I will not be provided additional refills as she has been referred to [facility] and will have to comply with their recommendations including PT  -also prescribed lidoderm patch which she is willing to try  -may also benefit from epidural inj

## 2015-06-26 LAB — CBC
Hematocrit: 32.8 % — ABNORMAL LOW (ref 34.0–46.6)
Hemoglobin: 10.5 g/dL — ABNORMAL LOW (ref 11.1–15.9)
MCH: 26.4 pg — ABNORMAL LOW (ref 26.6–33.0)
MCHC: 32 g/dL (ref 31.5–35.7)
MCV: 83 fL (ref 79–97)
PLATELETS: 445 10*3/uL — AB (ref 150–379)
RBC: 3.97 x10E6/uL (ref 3.77–5.28)
RDW: 14.8 % (ref 12.3–15.4)
WBC: 10.1 10*3/uL (ref 3.4–10.8)

## 2015-06-26 LAB — MICROALBUMIN / CREATININE URINE RATIO
CREATININE, UR: 139.2 mg/dL
MICROALB/CREAT RATIO: 2.3 mg/g{creat} (ref 0.0–30.0)
MICROALBUM., U, RANDOM: 3.2 ug/mL

## 2015-06-29 ENCOUNTER — Ambulatory Visit: Payer: BLUE CROSS/BLUE SHIELD | Admitting: Internal Medicine

## 2015-06-29 NOTE — Assessment & Plan Note (Signed)
Pt reports not taking pravastatin because she didn't realize she was supposed to be.  -refilled pravastatin and reminded her to take this every evening

## 2015-06-29 NOTE — Assessment & Plan Note (Signed)
Controlled. -cont current meds of enalapril 5mg  daily

## 2015-06-29 NOTE — Progress Notes (Signed)
Internal Medicine Clinic Attending  Case discussed with Dr. Gill soon after the resident saw the patient.  We reviewed the resident's history and exam and pertinent patient test results.  I agree with the assessment, diagnosis, and plan of care documented in the resident's note.  

## 2015-06-29 NOTE — Assessment & Plan Note (Signed)
HA1c 7.3 today.   -cont current meds, advised to cont to monitor carb intake and to try and lose weight

## 2015-07-05 ENCOUNTER — Encounter: Payer: Self-pay | Admitting: *Deleted

## 2015-07-17 ENCOUNTER — Other Ambulatory Visit: Payer: Self-pay | Admitting: Internal Medicine

## 2015-07-17 MED ORDER — ENALAPRIL MALEATE 5 MG PO TABS: 5.0000 mg | ORAL_TABLET | Freq: Every day | ORAL | Status: DC

## 2015-07-17 NOTE — Telephone Encounter (Signed)
Pt called requesting enalapril to be filled @ [facility] on [address].

## 2015-07-18 ENCOUNTER — Ambulatory Visit: Payer: BLUE CROSS/BLUE SHIELD | Admitting: Family Medicine

## 2015-07-20 ENCOUNTER — Encounter (HOSPITAL_COMMUNITY): Payer: Self-pay | Admitting: Emergency Medicine

## 2015-07-20 ENCOUNTER — Emergency Department (HOSPITAL_COMMUNITY)
Admission: EM | Admit: 2015-07-20 | Discharge: 2015-07-20 | Disposition: A | Discharge status: Home / Self Care | Payer: BLUE CROSS/BLUE SHIELD | Source: Home / Self Care | Attending: Family Medicine | Admitting: Family Medicine

## 2015-07-20 DIAGNOSIS — J069 Acute upper respiratory infection, unspecified: Secondary | ICD-10-CM

## 2015-07-20 DIAGNOSIS — B9789 Other viral agents as the cause of diseases classified elsewhere: Principal | ICD-10-CM

## 2015-07-20 MED ORDER — IPRATROPIUM BROMIDE 0.06 % NA SOLN: 2.0000 | Freq: Four times a day (QID) | NASAL | Status: DC

## 2015-07-20 MED ORDER — IBUPROFEN 600 MG PO TABS: 600.0000 mg | ORAL_TABLET | Freq: Four times a day (QID) | ORAL | Status: DC | PRN

## 2015-07-20 MED ORDER — GUAIFENESIN-CODEINE 100-10 MG/5ML PO SOLN: 5.0000 mL | Freq: Four times a day (QID) | ORAL | Status: DC | PRN

## 2015-07-20 NOTE — Discharge Instructions (Signed)
Your symptoms are likely from a viral illness. This may last another 2-3 days. These use the medications as prescribed to help your symptoms. He may continue the Sudafed to help with your stuffiness. Please also consider using Mucinex for additional relief.

## 2015-07-20 NOTE — ED Notes (Signed)
C/o cold sx onset 2 days Sx include: chills, runny nose, congestion, BA, dry cough  Alert... No acute distress.
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
## 2015-07-23 NOTE — Addendum Note (Signed)
Addended by: Hulan Fray on: 07/23/2015 04:46 PM   Modules accepted: Orders

## 2015-07-30 ENCOUNTER — Other Ambulatory Visit: Payer: Self-pay | Admitting: Internal Medicine

## 2015-07-30 DIAGNOSIS — E119 Type 2 diabetes mellitus without complications: Secondary | ICD-10-CM

## 2015-07-30 NOTE — Telephone Encounter (Signed)
Pt called requesting glipizide to be filled @ [facility] on [address].

## 2015-08-01 NOTE — Telephone Encounter (Signed)
6 month supply Rx'd in June

## 2015-08-01 NOTE — Telephone Encounter (Signed)
Left message on ID recording [phone] - to check with pharmacy - Rx done 04/2015 for 6 months.

## 2015-08-02 ENCOUNTER — Other Ambulatory Visit: Payer: Self-pay | Admitting: Internal Medicine

## 2015-08-02 ENCOUNTER — Other Ambulatory Visit: Payer: Self-pay | Admitting: Oncology

## 2015-08-02 DIAGNOSIS — E119 Type 2 diabetes mellitus without complications: Secondary | ICD-10-CM

## 2015-08-02 MED ORDER — GLIPIZIDE 5 MG PO TABS: 5.0000 mg | ORAL_TABLET | Freq: Two times a day (BID) | ORAL | Status: DC

## 2015-08-02 NOTE — Telephone Encounter (Signed)
Returned call. No answer, message left.

## 2015-08-02 NOTE — Telephone Encounter (Signed)
Please call pt back.

## 2015-08-02 NOTE — Telephone Encounter (Signed)
Pt is out of Glipizide, the Rx was written for # 30 take twice a day.  Please change to # 60 and resend. Pt is out of meds.

## 2015-08-02 NOTE — Telephone Encounter (Signed)
Returned call to pt.

## 2015-08-02 NOTE — Telephone Encounter (Signed)
Pt returned call. Please call back.

## 2015-08-08 ENCOUNTER — Ambulatory Visit (INDEPENDENT_AMBULATORY_CARE_PROVIDER_SITE_OTHER): Payer: BLUE CROSS/BLUE SHIELD | Admitting: Family Medicine

## 2015-08-08 ENCOUNTER — Encounter: Payer: Self-pay | Admitting: Family Medicine

## 2015-08-08 VITALS — BP 147/70 | HR 70 | Ht 76.0 in | Wt 289.0 lb

## 2015-08-08 DIAGNOSIS — M25571 Pain in right ankle and joints of right foot: Secondary | ICD-10-CM

## 2015-08-08 MED ORDER — MELOXICAM 15 MG PO TABS: ORAL_TABLET | ORAL | Status: DC

## 2015-08-08 MED ORDER — MELOXICAM 15 MG PO TABS: 15.0000 mg | ORAL_TABLET | Freq: Every day | ORAL | Status: DC

## 2015-08-08 NOTE — Progress Notes (Signed)
  AMRUTHA AVERA - 44 y.o. female MRN [id number]  Date of birth: 09-Nov-1970 BILLEE BALCERZAK is a 44 y.o. female who presents today for right foot/ankle pain.  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 She is unsure what exactly she had done but this was done at [facility] system. Over the past 4-6 months now she has had worsening right foot pain but has been having trouble with this for several years. Pain is localized on the lateral aspect of her ankle/foot. She is also had previously made custom orthotics which did help somewhat. She did see a podiatrist who recommended either repeat surgery, cortisone injection, cam walker boot, or medications. Denies any paresthesias or numbness going into her foot at this time.  PMHx - Updated and reviewed.  Contributory factors include:  Type 2 diabetes, hypertension PSHx - Updated and reviewed.  Contributory factors include: Talipes Equinovarus surgery  FHx - Updated and reviewed.  Contributory factors include:  Diabetes and colon cancer Medications - Vicodin when necessary   ROS Per HPI. 12 point negative otherwise  Exam:  Filed Vitals:   08/08/15 1601  BP: 147/70  Pulse: 70   Gen: NAD Cardiorespiratory - Normal respiratory effort/rate.  RRR Skin: No rashes or erythema. Head:  Mucous membranes moist  Feet: Right foot with evidence of previously well-healed incision on the lateral malleolus or/foot aspect. She also has a medial incision around the medial malleolus. She has significant pes planus with slight bunion deformity. Tenderness palpation along the sinus tarsi region. She has decreased range of motion in dorsiflexion plantarflexion of the right. Neurovascularly intact. Muscle strength is 5 out of 5. Left foot reveals pes planus with no tenderness palpation and full range of motion with neurovascular intact status.

## 2015-08-08 NOTE — Assessment & Plan Note (Signed)
Appears to have previous significant underlying surgery to her right ankle/foot region. Would not be surprised if she at this point has arthritis of the subtalar joint. -We will obtain 3 view of the ankle and foot. -Start Mobic for anti-inflammatory aspects. Creatinine from 2016 was less than 1. -Follow-up in 1 week which point we will consider further intervention including possible subtalar versus intra-articular injection, referral for joint fusion, repeat orthotics.

## 2015-08-10 ENCOUNTER — Ambulatory Visit
Admission: RE | Admit: 2015-08-10 | Discharge: 2015-08-10 | Disposition: A | Discharge status: Home / Self Care | Payer: BLUE CROSS/BLUE SHIELD | Source: Ambulatory Visit | Attending: Family Medicine | Admitting: Family Medicine

## 2015-08-10 DIAGNOSIS — M25571 Pain in right ankle and joints of right foot: Secondary | ICD-10-CM

## 2015-08-29 ENCOUNTER — Encounter: Payer: Self-pay | Admitting: Family Medicine

## 2015-08-29 ENCOUNTER — Ambulatory Visit (INDEPENDENT_AMBULATORY_CARE_PROVIDER_SITE_OTHER): Payer: BLUE CROSS/BLUE SHIELD | Admitting: Family Medicine

## 2015-08-29 VITALS — BP 145/66 | Ht 76.0 in | Wt 288.0 lb

## 2015-08-29 DIAGNOSIS — M19071 Primary osteoarthritis, right ankle and foot: Secondary | ICD-10-CM

## 2015-08-29 DIAGNOSIS — M19079 Primary osteoarthritis, unspecified ankle and foot: Secondary | ICD-10-CM | POA: Insufficient documentation

## 2015-08-29 MED ORDER — METHYLPREDNISOLONE ACETATE 40 MG/ML IJ SUSP
40.0000 mg | Freq: Once | INTRAMUSCULAR | Status: AC
  Administered 2015-08-29: 40 mg via INTRA_ARTICULAR

## 2015-08-29 MED ORDER — TRAMADOL HCL 50 MG PO TABS: 50.0000 mg | ORAL_TABLET | Freq: Four times a day (QID) | ORAL | Status: DC | PRN

## 2015-08-29 NOTE — Assessment & Plan Note (Signed)
Severe ankle tibiotalar OA, primary localized.   -Referral to [facility] for possible fusion. -Injection today for symptomatic relief and tramadol when necessary   Aspiration/Injection Procedure Note Tina [name] 02-06-1971  Procedure: Injection Indications: R ankle OA  Procedure Details Consent: Risks of procedure as well as the alternatives and risks of each were explained to the (patient/caregiver).  Consent for procedure obtained. Time Out: Verified patient identification, verified procedure, site/side was marked, verified correct patient position, special equipment/implants available, medications/allergies/relevent history reviewed, required imaging and test results available.  Performed.  The area was cleaned with iodine and alcohol swabs.    The R ankle tibiotalar joint was injected using 1 cc's of 40mg  Depomedrol and 1 cc's of 1% lidocaine with a 21 1 1/2" needle.  Ultrasound was used. Images were obtained in Transverse and Long views showing the injection.    A sterile dressing was applied.  Patient did tolerate procedure well. Estimated blood loss: None    .

## 2015-08-29 NOTE — Progress Notes (Signed)
  Tina [name] - 44 y.o. female MRN [id number]  Date of birth: July 26, 1971 Tina [name] is a 44 y.o. female who presents today for right foot/ankle pain.  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 She is unsure what exactly she had done but this was done at [facility] system. Patient follows up today for her right foot and ankle pain. X-rays reveal significant tibiotalar osteoarthritis with significant hallux valgus deformity of the right foot. Patient in significant amount of pain.  PMHx - Updated and reviewed.  Contributory factors include:  Type 2 diabetes, hypertension PSHx - Updated and reviewed.  Contributory factors include: Talipes Equinovarus surgery  FHx - Updated and reviewed.  Contributory factors include:  Diabetes and colon cancer Medications - Vicodin when necessary   ROS Per HPI. 12 point negative otherwise  Exam:  Filed Vitals:   08/29/15 1621  BP: 145/66   Gen: NAD Cardiorespiratory - Normal respiratory effort/rate.  RRR Skin: No rashes or erythema. Head:  Mucous membranes moist  Feet: Right foot with evidence of previously well-healed incision on the lateral malleolus or/foot aspect. She also has a medial incision around the medial malleolus. She has significant pes planus with slight bunion deformity. Tenderness palpation along the sinus tarsi region. She has decreased range of motion in dorsiflexion plantarflexion of the right. Neurovascularly intact. Muscle strength is 5 out of 5. Left foot reveals pes planus with no tenderness palpation and full range of motion with neurovascular intact status.

## 2015-08-29 NOTE — Patient Instructions (Signed)
[facility] [address] [place] Tuesday November 8th at [address] [place], [place] [address] Phone: [phone] Fax : [phone]

## 2015-09-05 ENCOUNTER — Emergency Department (INDEPENDENT_AMBULATORY_CARE_PROVIDER_SITE_OTHER)
Admission: EM | Admit: 2015-09-05 | Discharge: 2015-09-05 | Disposition: A | Discharge status: Home / Self Care | Payer: BLUE CROSS/BLUE SHIELD | Source: Home / Self Care

## 2015-09-05 ENCOUNTER — Encounter (HOSPITAL_COMMUNITY): Payer: Self-pay | Admitting: Emergency Medicine

## 2015-09-05 DIAGNOSIS — S91301A Unspecified open wound, right foot, initial encounter: Secondary | ICD-10-CM | POA: Diagnosis not present

## 2015-09-05 MED ORDER — KETOROLAC TROMETHAMINE 60 MG/2ML IM SOLN
INTRAMUSCULAR | Status: AC
  Filled 2015-09-05: qty 2

## 2015-09-05 MED ORDER — CLINDAMYCIN HCL 300 MG PO CAPS: 300.0000 mg | ORAL_CAPSULE | Freq: Three times a day (TID) | ORAL | Status: DC

## 2015-09-05 MED ORDER — KETOROLAC TROMETHAMINE 60 MG/2ML IM SOLN
60.0000 mg | Freq: Once | INTRAMUSCULAR | Status: AC
  Administered 2015-09-05: 60 mg via INTRAMUSCULAR

## 2015-09-05 NOTE — Discharge Instructions (Signed)
Deep Skin Avulsion A deep skin avulsion is a type of open wound. It often results from a severe injury (trauma) that tears away all layers of the skin or an entire body part. The areas of the body that are most often affected by a deep skin avulsion include the face, lips, ears, nose, and fingers. A deep skin avulsion may make structures below the skin become visible. You may be able to see muscle, bone, nerves, and blood vessels. A deep skin avulsion can also damage important structures beneath the skin. These include tendons, ligaments, nerves, or blood vessels. CAUSES Injuries that often cause a deep skin avulsion include:  Being crushed.  Falling against a jagged surface.  Animal bites.  Gunshot wounds.  Severe burns.  Injuries that involve being dragged, such as bicycle or motorcycle accidents. SYMPTOMS Symptoms of a deep skin avulsion include:  Pain.  Numbness.  Swelling.  A misshapen body part.  Bleeding, which may be heavy.  Fluid leaking from the wound. DIAGNOSIS This condition may be diagnosed with a medical history and physical exam. You may also have X-rays done. TREATMENT The treatment that is chosen for a deep skin avulsion depends on how large and deep the wound is and where it is located. Treatment for all types of avulsions usually starts with:  Controlling the bleeding.  Washing out the wound with a germ-free (sterile) salt-water solution.  Removing dead tissue from the wound. A wound may be closed or left open to heal. This depends on the size and location of the wound and whether it is likely to become infected. Wounds are usually covered or closed if they expose blood vessels, nerves, bone, or cartilage.  Wounds that are small and clean may be closed with stitches (sutures).  Wounds that cannot be closed with sutures may be covered with a piece of skin (graft) or a skin flap. Skin may be taken from on or near the wound, from another part of the body,  or from a donor.  Wounds may be left open if they are hard to close or they may become infected. These wounds heal over time from the bottom up. You may also receive medicine. This may include:  Antibiotics.  A tetanus shot.  Rabies vaccine. HOME CARE INSTRUCTIONS Medicines  Take or apply over-the-counter and prescription medicines only as told by your health care provider.  If you were prescribed an antibiotic, take or apply it as told by your health care provider. Do not stop taking the antibiotic even if your condition improves.  You may get anti-itch medicine while your wound is healing. Use it only as told by your health care provider. Wound Care  There are many ways to close and cover a wound. For example, a wound can be covered with sutures, skin glue, or adhesive strips. Follow instructions from your health care provider about:  How to take care of your wound.  When and how you should change your bandage (dressing).  When you should remove your dressing.  Removing whatever was used to close your wound.  Keep the dressing dry as told by your health care provider. Do not take baths, swim, use a hot tub, or do anything that would put your wound underwater until your health care provider approves.  Clean the wound each day or as told by your health care provider.  Wash the wound with mild soap and water.  Rinse the wound with water to remove all soap.  Pat the wound   dry with a clean towel. Do not rub it.  Do not scratch or pick at the wound.  Check your wound every day for signs of infection. Watch for:  Redness, swelling, or pain.  Fluid, blood, or pus. General Instructions  Raise (elevate) the injured area above the level of your heart while you are sitting or lying down.  Keep all follow-up visits as told by your health care provider. This is important. SEEK MEDICAL CARE IF:  You received a tetanus shot and you have swelling, severe pain, redness, or  bleeding at the injection site.  You have a fever.  Your pain is not controlled with medicine.  You have increased redness, swelling, or pain at the site of your wound.  You have fluid, blood, or pus coming from your wound.  You notice a bad smell coming from your wound or your dressing.  A wound that was closed breaks open.  You notice something coming out of the wound, such as wood or glass.  You notice a change in the color of your skin near your wound.  You develop a new rash.  You need to change the dressing frequently due to fluid, blood, or pus draining from the wound. SEEK IMMEDIATE MEDICAL CARE IF:  Your pain suddenly increases and is severe.  You develop severe swelling around the wound.  You develop numbness around the wound.  You have nausea and vomiting that does not go away after 24 hours.  You feel light-headed, weak, or faint.  You develop chest pain.  You have trouble breathing.  Your wound is on your hand or foot and you cannot properly move a finger or toe.  The wound is on your hand or foot and you notice that your fingers or toes look pale or bluish.  You have a red streak going away from your wound.   This information is not intended to replace advice given to you by your health care provider. Make sure you discuss any questions you have with your health care provider.   Document Released: 12/16/2006 Document Revised: 03/06/2015 Document Reviewed: 10/25/2014 Elsevier Interactive Patient Education 2016 Elsevier Inc.  

## 2015-09-05 NOTE — ED Notes (Signed)
Right foot ulcer, reports pus drainage.
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
## 2015-09-07 ENCOUNTER — Ambulatory Visit: Payer: BLUE CROSS/BLUE SHIELD | Admitting: Internal Medicine

## 2015-09-09 NOTE — ED Notes (Signed)
Patient requested work note with a return date of 11/8

## 2015-09-13 ENCOUNTER — Encounter: Payer: Self-pay | Admitting: Internal Medicine

## 2015-09-13 ENCOUNTER — Ambulatory Visit (INDEPENDENT_AMBULATORY_CARE_PROVIDER_SITE_OTHER): Payer: BLUE CROSS/BLUE SHIELD | Admitting: Internal Medicine

## 2015-09-13 VITALS — BP 127/66 | HR 95 | Temp 97.9°F | Wt 295.4 lb

## 2015-09-13 DIAGNOSIS — E11621 Type 2 diabetes mellitus with foot ulcer: Secondary | ICD-10-CM | POA: Diagnosis not present

## 2015-09-13 DIAGNOSIS — E11610 Type 2 diabetes mellitus with diabetic neuropathic arthropathy: Secondary | ICD-10-CM

## 2015-09-13 DIAGNOSIS — L97519 Non-pressure chronic ulcer of other part of right foot with unspecified severity: Secondary | ICD-10-CM

## 2015-09-13 DIAGNOSIS — L97511 Non-pressure chronic ulcer of other part of right foot limited to breakdown of skin: Secondary | ICD-10-CM

## 2015-09-13 DIAGNOSIS — L97509 Non-pressure chronic ulcer of other part of unspecified foot with unspecified severity: Secondary | ICD-10-CM | POA: Insufficient documentation

## 2015-09-13 LAB — GLUCOSE, CAPILLARY: Glucose-Capillary: 155 mg/dL — ABNORMAL HIGH (ref 65–99)

## 2015-09-13 NOTE — Assessment & Plan Note (Signed)
Pt with long standing diabetes with  3 week history of ulcer on medial right foot which had purulent drainage 2 weeks back and so she went to the ER.  Pt finished her clinda course prescribed in the ER Pt denies systemic symptoms Exam shows well healing 0.5 cm right foot ulcer with minimal serous drainage without associated erythema and ulcer limited to skin breakdown and no exposed bone Pt had surgeries on her right foot when she was young and on the recent xray  there is a presence of a hardware but it is in distal foot, not where she has the ulcer   Plan is to continue preventive care of the feet Very low likelihood of osteo so will hold off on further MRi  RTC if symptoms worsen
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
## 2015-09-13 NOTE — Patient Instructions (Signed)
Thank you for your visit today Please continue good foot care Please call back if you notice any pus draining out of the foot  Or if you have fevers

## 2015-09-14 NOTE — Progress Notes (Signed)
Internal Medicine Clinic Attending  I saw and evaluated the patient.  I personally confirmed the key portions of the history and exam documented by Dr. Tiburcio Pea and I reviewed pertinent patient test results.  The assessment, diagnosis, and plan were formulated together and I agree with the documentation in the resident's note.  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 The ulcer is a small wound at this point with minimal serous drainage. I agree with stopping antibiotics for now and follow up in a few weeks.

## 2015-09-19 ENCOUNTER — Telehealth: Payer: Self-pay | Admitting: Internal Medicine

## 2015-09-20 NOTE — Telephone Encounter (Signed)
Pt called and is concerned that foot is still painful and not getting better. She is requesting the MRI that was mentioned during last office visit and would prefer not to have to come in. Please advise.

## 2015-09-20 NOTE — Telephone Encounter (Signed)
I would prefer that she be seen in clinic if her pain is worsening.  She is diabetic and is at increased risk of developing complications.  Please call her back and let her know that I would like her to come in so that we may examine the foot and proceed accordingly.  Thanks.

## 2015-09-21 NOTE — Telephone Encounter (Signed)
Attempted to call patient, need to see her in clinic per Dr. Rosalita Levan

## 2015-09-24 NOTE — Telephone Encounter (Signed)
Tried to reach pt again, number ringing busy

## 2015-09-25 NOTE — Telephone Encounter (Signed)
Left voicemail for 3rd attempt.  Need to schedule pt to be seen per Dr. Gordy Levan.  They willnot just order MRI for her

## 2015-09-26 ENCOUNTER — Other Ambulatory Visit: Payer: Self-pay | Admitting: Internal Medicine

## 2015-10-08 ENCOUNTER — Ambulatory Visit (INDEPENDENT_AMBULATORY_CARE_PROVIDER_SITE_OTHER): Payer: BLUE CROSS/BLUE SHIELD | Admitting: Internal Medicine

## 2015-10-08 ENCOUNTER — Encounter: Payer: Self-pay | Admitting: Internal Medicine

## 2015-10-08 ENCOUNTER — Telehealth: Payer: Self-pay | Admitting: *Deleted

## 2015-10-08 VITALS — BP 153/65 | HR 82 | Temp 98.5°F | Wt 302.9 lb

## 2015-10-08 DIAGNOSIS — E118 Type 2 diabetes mellitus with unspecified complications: Secondary | ICD-10-CM

## 2015-10-08 DIAGNOSIS — M5116 Intervertebral disc disorders with radiculopathy, lumbar region: Secondary | ICD-10-CM

## 2015-10-08 MED ORDER — KETOROLAC TROMETHAMINE 30 MG/ML IJ SOLN: 30.0000 mg | Freq: Once | INTRAMUSCULAR | Status: DC

## 2015-10-08 MED ORDER — HYDROCODONE-ACETAMINOPHEN 5-325 MG PO TABS: 1.0000 | ORAL_TABLET | Freq: Three times a day (TID) | ORAL | Status: DC | PRN

## 2015-10-08 MED ORDER — KETOROLAC TROMETHAMINE 30 MG/ML IJ SOLN
30.0000 mg | Freq: Once | INTRAMUSCULAR | Status: AC
  Administered 2015-10-08: 30 mg via INTRAMUSCULAR

## 2015-10-08 NOTE — Assessment & Plan Note (Signed)
Pt has recurrence of symptoms identical to previous episodes of LBP. No red flag symptoms. Tramadol, gabapentin, ibuprofen, aleve all provide minimal relief. This is most likely recurrent acute LBP w sciatica. She did see SM but was evaluated for her ankle, not her back. At this time, discussing with Dr. Evette Doffing, an acute LBP picture like this may not benefit from PT. She seems to have resolution of symptoms in between these acute flares. -Toradol 30mg  IM x 1 here in clinic today -Will prescribe short course of Vicodin as this helped her get through last episode -Encouraged early ambulation, as much as tolerated -Also encouraged working with Butch Penny for further weight loss, as 20-30lb WL will likely provide great benefit for decreasing recurrence of these episodes

## 2015-10-08 NOTE — Telephone Encounter (Signed)
Walk-in,      Last night started having lower back radiating to both thighs, scale 9/0-10. Pt came home from work. She is crying at present and states "i need something done" dr Merrilyn Puma

## 2015-10-08 NOTE — Progress Notes (Signed)
   [name] female   DOB: 1971-05-06 44 y.o.   MRN: [id number]  Subjective:   HPI: [name] is a 44 y.o. 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 She was previously seen for this in 06/2015 but this resolved with a short course of Vicodin. She denies any recent trauma, strenuous activity, numbness, tingling, weakness, changes in bowel or urinary function, fevers, or any other symptoms. She has tried Aleve x 3 today which has not helped.   Please see problem-based charting for status of medical issues pertinent to this visit.  Review of Systems: Pertinent items noted in HPI and remainder of comprehensive ROS otherwise negative.  Objective:  Physical Exam: Filed Vitals:   10/08/15 1539  BP: 153/65  Pulse: 82  Temp: 98.5 F (36.9 C)  TempSrc: Oral  Weight: 302 lb 14.4 oz (137.395 kg)   Gen: Well-appearing, alert and oriented to person, place, and time HEENT: Oropharynx clear without erythema or exudate.  Neck: No cervical LAD, no thyromegaly or nodules, no JVD noted. CV: Normal rate, regular rhythm, no murmurs, rubs, or gallops Pulmonary: Normal effort, CTA bilaterally, no wheezing, rales, or rhonchi Abdominal: Soft, non-tender, non-distended, without rebound, guarding, or masses Extremities: Distal pulses 2+ in upper and lower extremities bilaterally, no tenderness, erythema or edema MSK: Diffuse tenderness to palpation along the lumbar paraspinal muscles. No vertebral point tenderness. Pain on back flexion>extension.  Skin: No atypical appearing moles. No rashes  Assessment & Plan:  Please see problem-based charting for assessment and  plan.  Blane Ohara, MD Resident Physician, PGY-1 Department of Internal Medicine [facility]

## 2015-10-08 NOTE — Telephone Encounter (Signed)
Seen by Dr. Merrilyn Puma today.

## 2015-10-08 NOTE — Patient Instructions (Signed)
Tina [name],  We will try a pain injection here today and give you a short course of pain medicine to help you get through this. You should be feeling better in the next few days. Let us know if you're still not better in the next week or so. Please continue to move around and be active as much as you can tolerate throughout this as it may help improve your back pain more quickly. Also, try and continue to work with Butch Penny about nutrition, as trying to lose weight will help put less strain on your back.   We hope you feel better! Thanks, Blane Ohara

## 2015-10-09 NOTE — Progress Notes (Signed)
Internal Medicine Clinic Attending  I saw and evaluated the patient.  I personally confirmed the key portions of the history and exam documented by Dr. Merrilyn Puma and I reviewed pertinent patient test results.  The assessment, diagnosis, and plan were formulated together and I agree with the documentation in the resident's note.  Acute lower back strain which I would anticipate to be self limited. We strongly encouraged ambulation and gradual increase in activity.

## 2015-10-09 NOTE — Addendum Note (Signed)
Addended by: Lalla Brothers T on: 10/09/2015 01:44 PM   Modules accepted: Level of Service

## 2015-10-11 ENCOUNTER — Telehealth: Payer: Self-pay | Admitting: Internal Medicine

## 2015-10-11 NOTE — Telephone Encounter (Signed)
Pt states still having back pain, hydrocodone is not working. Please call pt back.

## 2015-10-11 NOTE — Telephone Encounter (Signed)
Pt just here on 12/5.  Not getting relief with the vicodin.  Please advise.

## 2015-10-11 NOTE — Telephone Encounter (Signed)
Discussed with her at our visit that it may take more than a few days for pain to resolve, encouraged ambulation. There is not much stronger pain medication wise that would provide relief at this point given she was only seen 3 days ago. If her pain is worse or changed then she should be re-evaluated in clinic.

## 2015-10-12 ENCOUNTER — Ambulatory Visit (INDEPENDENT_AMBULATORY_CARE_PROVIDER_SITE_OTHER): Payer: BLUE CROSS/BLUE SHIELD | Admitting: Internal Medicine

## 2015-10-12 ENCOUNTER — Encounter: Payer: Self-pay | Admitting: Internal Medicine

## 2015-10-12 VITALS — BP 162/75 | HR 85 | Temp 98.0°F | Ht 76.0 in

## 2015-10-12 DIAGNOSIS — M5116 Intervertebral disc disorders with radiculopathy, lumbar region: Secondary | ICD-10-CM | POA: Diagnosis not present

## 2015-10-12 DIAGNOSIS — E118 Type 2 diabetes mellitus with unspecified complications: Secondary | ICD-10-CM | POA: Diagnosis not present

## 2015-10-12 DIAGNOSIS — S90822D Blister (nonthermal), left foot, subsequent encounter: Secondary | ICD-10-CM

## 2015-10-12 LAB — POCT GLYCOSYLATED HEMOGLOBIN (HGB A1C): HEMOGLOBIN A1C: 6.9

## 2015-10-12 LAB — GLUCOSE, CAPILLARY: GLUCOSE-CAPILLARY: 156 mg/dL — AB (ref 65–99)

## 2015-10-12 MED ORDER — SALSALATE 750 MG PO TABS: 750.0000 mg | ORAL_TABLET | Freq: Three times a day (TID) | ORAL | Status: DC | PRN

## 2015-10-12 MED ORDER — SALSALATE 750 MG PO TABS: 750.0000 mg | ORAL_TABLET | Freq: Four times a day (QID) | ORAL | Status: DC | PRN

## 2015-10-12 MED ORDER — HYDROCODONE-ACETAMINOPHEN 5-325 MG PO TABS: 1.0000 | ORAL_TABLET | Freq: Three times a day (TID) | ORAL | Status: DC | PRN

## 2015-10-12 MED ORDER — HYDROCODONE-ACETAMINOPHEN 5-325 MG PO TABS: 1.0000 | ORAL_TABLET | Freq: Four times a day (QID) | ORAL | Status: DC | PRN

## 2015-10-12 MED ORDER — SALSALATE 750 MG PO TABS: 750.0000 mg | ORAL_TABLET | Freq: Three times a day (TID) | ORAL | Status: DC
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
## 2015-10-12 NOTE — Telephone Encounter (Signed)
Spoke with patient.  She states she has been to Sports Medicine in October, however, they did not treat her back, they treated her foot ulcer.  See epic to confirm this.  Pt is not requesting continued narcotics but feels that there is something more with her back that needs to be addressed and is requesting to come in as she has had to leave work again today and cannot continue to do this.  Pt added to schedule for this afternoon, I also advised her to call the Sports Medicine office to see if they would see her since the referral in epic is technically for her back pain/herniated disc.  She will call them, and report to [place] if she no longer needs our appointment, or if they require a new referral.

## 2015-10-12 NOTE — Telephone Encounter (Signed)
I agree with Dr. Merrilyn Puma.  Additionally, I referred her to sports medicine/physical therapy back in August which I still feel would be beneficial given her ongoing pain.  However, I think they were unable to contact her.  Thanks.

## 2015-10-12 NOTE — Assessment & Plan Note (Signed)
Overview She expresses concerns about increased swelling overlying the dorsum of her left foot. For the ulcer on her left foot, she covers it with grease. She denies any fevers, fluctuance, purulent drainage, tenderness though feels that she has intermittent burning pains in her feet bilaterally.   Assessment Diabetic foot ulcer without infection. Appears to callus may have developed a skin defect though no surrounding erythema or purulent drainage is apparent on physical exam.  Plan -Continue conservative management by keeping it dry and covered -Provide her with return precautions: Fever, worsening redness, worsening pain

## 2015-10-12 NOTE — Progress Notes (Signed)
   Subjective:    [name], female    DOB: 05/24/1971, 44 y.o.   MRN: [id number]  HPI Ms. Howrey is a 44 year old female with hypertension, type 2 diabetes lumbar disc herniation with radiculopathy who presents today for back pain. Please see assessment & plan for status of chronic medical problems.    Review of Systems  Constitutional: Negative for fever, chills and diaphoresis.  Genitourinary:       No bowel/bladder incontinence.  Musculoskeletal: Positive for back pain.  Neurological: Positive for numbness (Intermittent).       Objective:   Physical Exam  Constitutional: She is oriented to person, place, and time. She appears well-developed and well-nourished. No distress.  Young, obese African-American female  HENT:  Head: Normocephalic and atraumatic.  Eyes: Conjunctivae and EOM are normal. Pupils are equal, round, and reactive to light. No scleral icterus.  Cardiovascular:  2+ dorsalis pedis pulse bilaterally.  Musculoskeletal:  Pain elicited with flexion/extension of the hip. Paraspinal tenderness noted bilaterally.  Neurological: She is alert and oriented to person, place, and time. She has normal reflexes. No cranial nerve deficit.  Skin: Skin is warm and dry. She is not diaphoretic.  On the left, ulcer localized to the medial surface of the heel with surrounding swelling but no erythema or pain elicited with palpation. On the right, well-healing ulcer on the medial aspect of the arch with sanguinous discharge on the bandage but no erythema or pain elicited with palpation.   Psychiatric: She has a normal mood and affect.          Assessment & Plan:

## 2015-10-12 NOTE — Telephone Encounter (Signed)
PT has contacted sports medicine, they have her scheduled for 12/21.  She will be in clinic today.

## 2015-10-12 NOTE — Patient Instructions (Addendum)
Please take Disalcid 1 tablet every 8 hours as needed. Alternate this medication with the Vicodin you're taking currently so you have around-the-clock treatment.  For your foot, if he notices worsening redness, pain, swelling, drainage, please give [place] a call back so you can be seen immediately.  Try to stay positive until you can see sports medicine.

## 2015-10-12 NOTE — Telephone Encounter (Signed)
Ok great. Re-evaluation in clinic is best since she is still not getting relief. I appreciate the follow-up! Sports medicine should be able to weigh in again as well which will be appreciated if/when this acute flare subsides.  Thanks again, [place]

## 2015-10-12 NOTE — Assessment & Plan Note (Addendum)
Overview Last A1c was 7.3 back in August. Due for recheck today.  Assessment Type 2 diabetes well controlled with medications.  Plan -Recheck A1c today -Continue glipizide 5 mg, metformin 1000 mg twice daily  ADDENDUM 10/12/2015  4:09 PM:  A1c 6.9, reflective of good control.

## 2015-10-12 NOTE — Progress Notes (Signed)
Internal Medicine Clinic Attending  Case discussed with Dr. Patel,Rushil at the time of the visit.  We reviewed the resident's history and exam and pertinent patient test results.  I agree with the assessment, diagnosis, and plan of care documented in the resident's note.  

## 2015-10-24 ENCOUNTER — Encounter: Payer: Self-pay | Admitting: Family Medicine

## 2015-10-24 ENCOUNTER — Ambulatory Visit (INDEPENDENT_AMBULATORY_CARE_PROVIDER_SITE_OTHER): Payer: BLUE CROSS/BLUE SHIELD | Admitting: Family Medicine

## 2015-10-24 VITALS — BP 140/82 | Ht 76.0 in | Wt 288.0 lb

## 2015-10-24 DIAGNOSIS — M5116 Intervertebral disc disorders with radiculopathy, lumbar region: Secondary | ICD-10-CM

## 2015-10-24 MED ORDER — NAPROXEN 500 MG PO TABS: 500.0000 mg | ORAL_TABLET | Freq: Two times a day (BID) | ORAL | Status: DC | PRN
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
## 2015-10-24 NOTE — Assessment & Plan Note (Signed)
Most likely multifactorial including paraspinal muscle spasm due to underlying DJD/DDD of the lumbar spine - Lumbar x-ray to evaluate underlying etiology - Stop Salasalte/mobic/ibuprofen and start naprosyn 500 mg for the next month - Continue tramadol and restart Neurontin. - Consider PT referral as well for core strengthening - Consider steroid taper with stopping NSAID due to GI bleed risk - Consider MRI at next visit too with possible epidural injection vs neurosurgery eval - F/U in 2-3 weeks

## 2015-10-30 ENCOUNTER — Ambulatory Visit
Admission: RE | Admit: 2015-10-30 | Discharge: 2015-10-30 | Disposition: A | Discharge status: Home / Self Care | Payer: BLUE CROSS/BLUE SHIELD | Source: Ambulatory Visit | Attending: Family Medicine | Admitting: Family Medicine

## 2015-10-30 DIAGNOSIS — M5116 Intervertebral disc disorders with radiculopathy, lumbar region: Secondary | ICD-10-CM

## 2015-11-14 ENCOUNTER — Encounter: Payer: Self-pay | Admitting: Family Medicine

## 2015-11-14 ENCOUNTER — Ambulatory Visit (INDEPENDENT_AMBULATORY_CARE_PROVIDER_SITE_OTHER): Payer: BLUE CROSS/BLUE SHIELD | Admitting: Family Medicine

## 2015-11-14 VITALS — BP 150/70 | Ht 76.0 in | Wt 288.0 lb

## 2015-11-14 DIAGNOSIS — M5116 Intervertebral disc disorders with radiculopathy, lumbar region: Secondary | ICD-10-CM | POA: Diagnosis not present

## 2015-11-14 MED ORDER — TRAMADOL HCL 50 MG PO TABS: 50.0000 mg | ORAL_TABLET | Freq: Two times a day (BID) | ORAL | Status: DC | PRN
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
## 2015-11-14 NOTE — Assessment & Plan Note (Signed)
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 - Continue neurontin and start tramadol 50 mg q 6-8 hrs prn - Start PT - Consider steroid taper and d/c NSAID - Consider MRI w/ epidural vs neurosurgery consult.  - F/U in 6-8 weeks to rediscuss

## 2015-12-07 ENCOUNTER — Ambulatory Visit: Payer: BLUE CROSS/BLUE SHIELD | Admitting: Internal Medicine

## 2015-12-12 ENCOUNTER — Encounter: Payer: Self-pay | Admitting: Internal Medicine

## 2015-12-12 ENCOUNTER — Ambulatory Visit (HOSPITAL_COMMUNITY)
Admission: RE | Admit: 2015-12-12 | Discharge: 2015-12-12 | Disposition: A | Discharge status: Home / Self Care | Payer: BLUE CROSS/BLUE SHIELD | Source: Ambulatory Visit | Attending: Internal Medicine | Admitting: Internal Medicine

## 2015-12-12 ENCOUNTER — Ambulatory Visit (INDEPENDENT_AMBULATORY_CARE_PROVIDER_SITE_OTHER): Payer: BLUE CROSS/BLUE SHIELD | Admitting: Internal Medicine

## 2015-12-12 DIAGNOSIS — M7989 Other specified soft tissue disorders: Secondary | ICD-10-CM | POA: Diagnosis not present

## 2015-12-12 DIAGNOSIS — E11621 Type 2 diabetes mellitus with foot ulcer: Secondary | ICD-10-CM | POA: Diagnosis not present

## 2015-12-12 DIAGNOSIS — M19071 Primary osteoarthritis, right ankle and foot: Secondary | ICD-10-CM | POA: Insufficient documentation

## 2015-12-12 DIAGNOSIS — L97519 Non-pressure chronic ulcer of other part of right foot with unspecified severity: Secondary | ICD-10-CM | POA: Diagnosis not present

## 2015-12-12 DIAGNOSIS — M2141 Flat foot [pes planus] (acquired), right foot: Secondary | ICD-10-CM | POA: Insufficient documentation

## 2015-12-12 MED ORDER — HYDROCODONE-ACETAMINOPHEN 5-325 MG PO TABS: 1.0000 | ORAL_TABLET | Freq: Three times a day (TID) | ORAL | Status: DC | PRN

## 2015-12-12 MED ORDER — AMOXICILLIN 500 MG PO CAPS: 500.0000 mg | ORAL_CAPSULE | Freq: Three times a day (TID) | ORAL | Status: DC

## 2015-12-12 MED ORDER — DOXYCYCLINE HYCLATE 100 MG PO TABS: 100.0000 mg | ORAL_TABLET | Freq: Two times a day (BID) | ORAL | Status: DC
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
## 2015-12-12 NOTE — Patient Instructions (Signed)
We will do some blood work to ensure you have no extension of the ulcer to your bone.   We will prescribe 2 antibiotics for you- Doxycycline- take one tablet two times a day for 10 days and amoxicillin take one tablet 2 times a day for 10 days.  Also please keep your feet elevated.  We will send you to a foot doctor and wound doctor.

## 2015-12-13 LAB — CBC
Hematocrit: 35.6 % (ref 34.0–46.6)
Hemoglobin: 11.7 g/dL (ref 11.1–15.9)
MCH: 27.3 pg (ref 26.6–33.0)
MCHC: 32.9 g/dL (ref 31.5–35.7)
MCV: 83 fL (ref 79–97)
Platelets: 387 10*3/uL — ABNORMAL HIGH (ref 150–379)
RBC: 4.28 x10E6/uL (ref 3.77–5.28)
RDW: 13.8 % (ref 12.3–15.4)
WBC: 8.5 10*3/uL (ref 3.4–10.8)

## 2015-12-13 LAB — C-REACTIVE PROTEIN: CRP: 7.8 mg/L — AB (ref 0.0–4.9)

## 2015-12-13 LAB — SEDIMENTATION RATE: Sed Rate: 16 mm/hr (ref 0–32)

## 2015-12-13 NOTE — Assessment & Plan Note (Addendum)
Persistent ulcer for 3 months. Significant improvement per patient after clindamycin- 14 day course given in ED-09/05/2015. Pt says drainage improved and wound closed but reappeared again. By last clinic visit in nov, noted was a well healing ulcer. Ulcer position- suggest venous stasis ulcer, but witout typical dermatitis associated with ulcer, also shape of ulcer- circular suggest arterialPulses not palpated in feet. Plan- Give a 10 day course of doxycycline 132m BID and Amoxicillin- 5033mTID, hesitant to use Clindamycin again due to risk of C. Diff. - Emphasized keeping feet elevated - ABI - Xray feet with ESR and CRP- concern for osteomyelitis in this DM pt. - Norco for pain., she has tried NSAIDS, Tylenol without relief - see in 2 weeks. - Referral to podiatry for extensive callus on heel of left feet - Referrla to Wound care. - CBC

## 2015-12-17 ENCOUNTER — Telehealth: Payer: Self-pay | Admitting: Internal Medicine

## 2015-12-17 NOTE — Telephone Encounter (Signed)
Patient called concerning her lab work results on 12/12/2015.  Please call her on her cell phone listed.

## 2015-12-18 NOTE — Progress Notes (Signed)
Internal Medicine Clinic Attending  Case discussed with Dr. Emokpae at the time of the visit.  We reviewed the resident's history and exam and pertinent patient test results.  I agree with the assessment, diagnosis, and plan of care documented in the resident's note.  

## 2015-12-19 ENCOUNTER — Encounter: Payer: Self-pay | Admitting: *Deleted

## 2015-12-19 ENCOUNTER — Telehealth: Payer: Self-pay | Admitting: Internal Medicine

## 2015-12-19 NOTE — Telephone Encounter (Signed)
Called patient back- 12/18/2014, called at 6pm and again at 7.20pm. Picked after third call. Explained results of tests, elevated CRP, normal ESR, unchanged foot xray. She says foot appears to be healing with much less drainage, and less pain, she denies malaise or fever.  Ulcer for ~3 months, and if bone involvement, should show up on xray by now. Will hold off on getting MRi for now, till next visit- Pt to come in end of the month. She will follow up with wound care. Also get ABIs by next visit to rule out PVD.  Pt understood, asked questions which I answered and agreed to plan.  Ejiro.

## 2015-12-19 NOTE — Telephone Encounter (Signed)
   Reason for call:   I received a call from Ms. Effie Shy Mazurkiewicz at 632 PM indicating confusion about labwork.   Pertinent Data:   She reports she is continuing to take the antibiotic and has noted improvement in her pain. She reports soaking it in Epson salt with water.  She wants to know when she is due for ABI along with the rest of her workup.   Assessment / Plan / Recommendations:   Per the last office note, I reviewed her lab work with her which was notable for an elevated CRP but reassuring ESR. I also reviewed her lab imaging with her. My overall impression to her was that do not appear completely consistent with osteomyelitis but that it is difficult for me to make that diagnosis without having actually seen the foot which Dr. Denton Brick had.  I will route this message back to Dr. Denton Brick and [place] for further recommendations to which she acknowledged agreement.  As always, pt is advised that if symptoms worsen or new symptoms arise, they should go to an urgent care facility or to to ER for further evaluation.   Riccardo Dubin, MD   12/19/2015, 6:33 PM

## 2015-12-19 NOTE — Telephone Encounter (Signed)
Called patient back no response. Got voice mail. Did not leave detailed message. Will call back.

## 2015-12-26 ENCOUNTER — Ambulatory Visit: Payer: BLUE CROSS/BLUE SHIELD | Admitting: Family Medicine

## 2016-01-08 ENCOUNTER — Encounter (HOSPITAL_BASED_OUTPATIENT_CLINIC_OR_DEPARTMENT_OTHER): Payer: BLUE CROSS/BLUE SHIELD | Attending: Surgery

## 2016-01-08 DIAGNOSIS — Z6835 Body mass index (BMI) 35.0-35.9, adult: Secondary | ICD-10-CM | POA: Diagnosis not present

## 2016-01-08 DIAGNOSIS — E669 Obesity, unspecified: Secondary | ICD-10-CM | POA: Diagnosis not present

## 2016-01-08 DIAGNOSIS — E11621 Type 2 diabetes mellitus with foot ulcer: Secondary | ICD-10-CM | POA: Insufficient documentation

## 2016-01-08 DIAGNOSIS — F17210 Nicotine dependence, cigarettes, uncomplicated: Secondary | ICD-10-CM | POA: Insufficient documentation

## 2016-01-08 DIAGNOSIS — I10 Essential (primary) hypertension: Secondary | ICD-10-CM | POA: Insufficient documentation

## 2016-01-08 DIAGNOSIS — L97511 Non-pressure chronic ulcer of other part of right foot limited to breakdown of skin: Secondary | ICD-10-CM | POA: Diagnosis not present

## 2016-01-08 DIAGNOSIS — D649 Anemia, unspecified: Secondary | ICD-10-CM | POA: Insufficient documentation

## 2016-01-08 DIAGNOSIS — E785 Hyperlipidemia, unspecified: Secondary | ICD-10-CM | POA: Insufficient documentation

## 2016-01-14 ENCOUNTER — Emergency Department (INDEPENDENT_AMBULATORY_CARE_PROVIDER_SITE_OTHER)
Admission: EM | Admit: 2016-01-14 | Discharge: 2016-01-14 | Disposition: A | Discharge status: Home / Self Care | Payer: BLUE CROSS/BLUE SHIELD | Source: Home / Self Care | Attending: Emergency Medicine | Admitting: Emergency Medicine

## 2016-01-14 ENCOUNTER — Encounter (HOSPITAL_COMMUNITY): Payer: Self-pay | Admitting: Emergency Medicine

## 2016-01-14 ENCOUNTER — Other Ambulatory Visit: Payer: Self-pay

## 2016-01-14 ENCOUNTER — Other Ambulatory Visit: Payer: Self-pay | Admitting: Internal Medicine

## 2016-01-14 DIAGNOSIS — E11621 Type 2 diabetes mellitus with foot ulcer: Secondary | ICD-10-CM | POA: Diagnosis not present

## 2016-01-14 DIAGNOSIS — L97519 Non-pressure chronic ulcer of other part of right foot with unspecified severity: Secondary | ICD-10-CM

## 2016-01-14 DIAGNOSIS — S91001S Unspecified open wound, right ankle, sequela: Secondary | ICD-10-CM | POA: Diagnosis not present

## 2016-01-14 DIAGNOSIS — L97509 Non-pressure chronic ulcer of other part of unspecified foot with unspecified severity: Secondary | ICD-10-CM | POA: Diagnosis not present

## 2016-01-14 MED ORDER — HYDROCODONE-ACETAMINOPHEN 5-325 MG PO TABS: 1.0000 | ORAL_TABLET | Freq: Three times a day (TID) | ORAL | Status: DC | PRN
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
## 2016-01-14 NOTE — ED Notes (Signed)
Patient has an ulcer on instep of right foot.  Patient has been seen by the wound center.  Patient was expecting this "honey" cream/ointment to arrive in the mail.  It did not and wound center failed to notify her that her insurance would not pay.  Patient reports she has purchased medicine, but it will not arrive before tomorrow.  Patient reports extreme pain.  Reports this special cream helped pain when applied at wound center, but without the cream she has pain.  Right pedal pulse 2 plus

## 2016-01-14 NOTE — Telephone Encounter (Signed)
Spoke with patient, she never filled tramadol because it does not help her pain, she told Dr. Awanda Mink this as well as [place] and was then given Norco. (I sent refill on Tramadol instead of Norco to avoid pt having to come to clinic, however she says she can get here)

## 2016-01-14 NOTE — Telephone Encounter (Signed)
Spoke with patient, she has a wound on her foot, is being treated by wound center.  They placed medicine and an initial dressing last week and ordered her supplies be sent to her home.  Pt changed dressing on Friday as instructed when her order arrived but the medication was missing (coming this week).  Since Friday she has been in increasing pain.  Tylenol and ibuprofen are not helping her and she has so much pain today she was unable to work.  She was directed to [place] from wound center to help with pain.  Could we give a refill on tramadol to get her through?

## 2016-01-14 NOTE — Telephone Encounter (Addendum)
Last hydrocodone on 2/8 Dr Johnette Abraham. Dr Gordy Levan had been Rxing 30 at a time but had a plan to tx chronic back pain with other meds also. I filled for 30 but asked Leigh to emphasize that this script is no way indicating that Dr Gordy Levan will or will not cont to fill. I encouraged pt to sch appt next couple weeks with Dr Gordy Levan to further discuss pain as it seems she has acute and chronic pain.

## 2016-01-14 NOTE — Addendum Note (Signed)
Addended by: Larey Dresser A on: 01/14/2016 11:58 AM   Modules accepted: Orders

## 2016-01-14 NOTE — Discharge Instructions (Signed)
Diabetes and Foot Care Keep wound clean with mild soap and warm water. Keep covered with Band-Aid so was not to have anything rub against it. Pick your prescription for hydrocodone today and take it for pain. Call or follow-up with your PCP or wound center tomorrow as needed and hopefully your medicine will arrive soon. Diabetes may cause you to have problems because of poor blood supply (circulation) to your feet and legs. This may cause the skin on your feet to become thinner, break easier, and heal more slowly. Your skin may become dry, and the skin may peel and crack. You may also have nerve damage in your legs and feet causing decreased feeling in them. You may not notice minor injuries to your feet that could lead to infections or more serious problems. Taking care of your feet is one of the most important things you can do for yourself.  HOME CARE INSTRUCTIONS  Wear shoes at all times, even in the house. Do not go barefoot. Bare feet are easily injured.  Check your feet daily for blisters, cuts, and redness. If you cannot see the bottom of your feet, use a mirror or ask someone for help.  Wash your feet with warm water (do not use hot water) and mild soap. Then pat your feet and the areas between your toes until they are completely dry. Do not soak your feet as this can dry your skin.  Apply a moisturizing lotion or petroleum jelly (that does not contain alcohol and is unscented) to the skin on your feet and to dry, brittle toenails. Do not apply lotion between your toes.  Trim your toenails straight across. Do not dig under them or around the cuticle. File the edges of your nails with an emery board or nail file.  Do not cut corns or calluses or try to remove them with medicine.  Wear clean socks or stockings every day. Make sure they are not too tight. Do not wear knee-high stockings since they may decrease blood flow to your legs.  Wear shoes that fit properly and have enough  cushioning. To break in new shoes, wear them for just a few hours a day. This prevents you from injuring your feet. Always look in your shoes before you put them on to be sure there are no objects inside.  Do not cross your legs. This may decrease the blood flow to your feet.  If you find a minor scrape, cut, or break in the skin on your feet, keep it and the skin around it clean and dry. These areas may be cleansed with mild soap and water. Do not cleanse the area with peroxide, alcohol, or iodine.  When you remove an adhesive bandage, be sure not to damage the skin around it.  If you have a wound, look at it several times a day to make sure it is healing.  Do not use heating pads or hot water bottles. They may burn your skin. If you have lost feeling in your feet or legs, you may not know it is happening until it is too late.  Make sure your health care provider performs a complete foot exam at least annually or more often if you have foot problems. Report any cuts, sores, or bruises to your health care provider immediately. SEEK MEDICAL CARE IF:   You have an injury that is not healing.  You have cuts or breaks in the skin.  You have an ingrown nail.  You notice  redness on your legs or feet.  You feel burning or tingling in your legs or feet.  You have pain or cramps in your legs and feet.  Your legs or feet are numb.  Your feet always feel cold. SEEK IMMEDIATE MEDICAL CARE IF:   There is increasing redness, swelling, or pain in or around a wound.  There is a red line that goes up your leg.  Pus is coming from a wound.  You develop a fever or as directed by your health care provider.  You notice a bad smell coming from an ulcer or wound.   This information is not intended to replace advice given to you by your health care provider. Make sure you discuss any questions you have with your health care provider.   Document Released: 10/17/2000 Document Revised: 06/22/2013  Document Reviewed: 03/29/2013 Elsevier Interactive Patient Education [employer].

## 2016-01-14 NOTE — Telephone Encounter (Signed)
Pt is having pain in right foot/ pt goes to wound center. But was told she needed to go to PCP for pain. Pt refused appt for wednesday

## 2016-01-14 NOTE — Telephone Encounter (Signed)
According to the [facility], she did not get the tramadol Rx by Dr Awanda Mink. She did fill the hydrocodone fill that Dr E Rx'd in Feb. Tell her she can fill the Rx by Dr Awanda Mink. If she states she did fill it already (database can be wrong), pls have her tell us which pharmacy and call to verify. Thanks

## 2016-01-14 NOTE — Telephone Encounter (Signed)
Last refill 2/8 at [facility]

## 2016-01-16 DIAGNOSIS — E11621 Type 2 diabetes mellitus with foot ulcer: Secondary | ICD-10-CM | POA: Diagnosis not present

## 2016-01-16 NOTE — Addendum Note (Signed)
Addended by: Hulan Fray on: 01/16/2016 05:22 PM   Modules accepted: Orders

## 2016-01-21 ENCOUNTER — Encounter: Payer: Self-pay | Admitting: Internal Medicine

## 2016-01-21 ENCOUNTER — Other Ambulatory Visit: Payer: Self-pay | Admitting: Internal Medicine

## 2016-01-21 ENCOUNTER — Ambulatory Visit (INDEPENDENT_AMBULATORY_CARE_PROVIDER_SITE_OTHER): Payer: BLUE CROSS/BLUE SHIELD | Admitting: Internal Medicine

## 2016-01-21 DIAGNOSIS — E119 Type 2 diabetes mellitus without complications: Secondary | ICD-10-CM

## 2016-01-21 DIAGNOSIS — E785 Hyperlipidemia, unspecified: Secondary | ICD-10-CM | POA: Diagnosis not present

## 2016-01-21 DIAGNOSIS — Z79899 Other long term (current) drug therapy: Secondary | ICD-10-CM

## 2016-01-21 DIAGNOSIS — E118 Type 2 diabetes mellitus with unspecified complications: Secondary | ICD-10-CM

## 2016-01-21 DIAGNOSIS — L97519 Non-pressure chronic ulcer of other part of right foot with unspecified severity: Secondary | ICD-10-CM | POA: Diagnosis not present

## 2016-01-21 DIAGNOSIS — Z7984 Long term (current) use of oral hypoglycemic drugs: Secondary | ICD-10-CM | POA: Diagnosis not present

## 2016-01-21 DIAGNOSIS — F17210 Nicotine dependence, cigarettes, uncomplicated: Secondary | ICD-10-CM

## 2016-01-21 DIAGNOSIS — I10 Essential (primary) hypertension: Secondary | ICD-10-CM

## 2016-01-21 DIAGNOSIS — Z720 Tobacco use: Secondary | ICD-10-CM | POA: Insufficient documentation

## 2016-01-21 DIAGNOSIS — E11621 Type 2 diabetes mellitus with foot ulcer: Secondary | ICD-10-CM | POA: Diagnosis not present

## 2016-01-21 LAB — HM DIABETES EYE EXAM

## 2016-01-21 LAB — GLUCOSE, CAPILLARY: GLUCOSE-CAPILLARY: 146 mg/dL — AB (ref 65–99)

## 2016-01-21 LAB — POCT GLYCOSYLATED HEMOGLOBIN (HGB A1C): HEMOGLOBIN A1C: 7.6

## 2016-01-21 MED ORDER — NICOTINE 14 MG/24HR TD PT24: 14.0000 mg | MEDICATED_PATCH | TRANSDERMAL | Status: DC

## 2016-01-21 MED ORDER — SITAGLIP PHOS-METFORMIN HCL ER 100-1000 MG PO TB24: 1.0000 | ORAL_TABLET | Freq: Every day | ORAL | Status: DC
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
## 2016-01-21 NOTE — Assessment & Plan Note (Addendum)
A: Right medial foot ulcer: Does not appear infected.  She has been going to wound care every Tues.  She states that it is very painful especially when bearing weight on the foot.  She works on her feet all day at [employer] and is requesting a work note.  I am happy to provide her with a work note. However, if she needs an FMLA form completed, I will need to discuss this with wound care since I am not treating her wound and they would have a better assessment of how long she may need to be off of her feet.  Of note, she states that norco is not helping her neither is ibuprofen.  She reports the only thing helping is staying off her foot.  She is tearful.  P: -cont to f/u with wound care -work excuse given today  -would not provide additional refills of norco given that it has not helped her (although she did not request any pain meds today)

## 2016-01-21 NOTE — Assessment & Plan Note (Signed)
A: Dyslipidemia P: -check lipid panel

## 2016-01-21 NOTE — Patient Instructions (Signed)
Thank you for your visit today.   Please return to the internal medicine clinic in 6 weeks or sooner if needed.     I have made the following additions/changes to your medications:  You may continue to take ibuprofen for pain (do not exceed 3200mg /day of ibuprofen).  I would not take vicodin if this upsets your stomach.  You may also try tylenol not to exceed (3000mg /day).   Please continue to be followed by the wound care.   Please be sure to bring all of your medications with you to every visit; this includes herbal supplements, vitamins, eye drops, and any over-the-counter medications.   Should you have any questions regarding your medications and/or any new or worsening symptoms, please be sure to call the clinic at [phone].   If you believe that you are suffering from a life threatening condition or one that may result in the loss of limb or function, then you should call 911 and proceed to the nearest Emergency Department.   A healthy lifestyle and preventative care can promote health and wellness.   Maintain regular health, dental, and eye exams.  Eat a healthy diet. Foods like vegetables, fruits, whole grains, low-fat dairy products, and lean protein foods contain the nutrients you need without too many calories. Decrease your intake of foods high in solid fats, added sugars, and salt. Get information about a proper diet from your caregiver, if necessary.  Regular physical exercise is one of the most important things you can do for your health. Most adults should get at least 150 minutes of moderate-intensity exercise (any activity that increases your heart rate and causes you to sweat) each week. In addition, most adults need muscle-strengthening exercises on 2 or more days a week.   Maintain a healthy weight. The body mass index (BMI) is a screening tool to identify possible weight problems. It provides an estimate of body fat based on height and weight. Your caregiver can  help determine your BMI, and can help you achieve or maintain a healthy weight. For adults 20 years and older:  A BMI below 18.5 is considered underweight.  A BMI of 18.5 to 24.9 is normal.  A BMI of 25 to 29.9 is considered overweight.  A BMI of 30 and above is considered obese.  Smoking Cessation, Tips for Success If you are ready to quit smoking, congratulations! You have chosen to help yourself be healthier. Cigarettes bring nicotine, tar, carbon monoxide, and other irritants into your body. Your lungs, heart, and blood vessels will be able to work better without these poisons. There are many different ways to quit smoking. Nicotine gum, nicotine patches, a nicotine inhaler, or nicotine nasal spray can help with physical craving. Hypnosis, support groups, and medicines help break the habit of smoking. WHAT THINGS CAN I DO TO MAKE QUITTING EASIER?  Here are some tips to help you quit for good:  Pick a date when you will quit smoking completely. Tell all of your friends and family about your plan to quit on that date.  Do not try to slowly cut down on the number of cigarettes you are smoking. Pick a quit date and quit smoking completely starting on that day.  Throw away all cigarettes.   Clean and remove all ashtrays from your home, work, and car.  On a card, write down your reasons for quitting. Carry the card with you and read it when you get the urge to smoke.  Cleanse your body of nicotine.  Drink enough water and fluids to keep your urine clear or pale yellow. Do this after quitting to flush the nicotine from your body.  Learn to predict your moods. Do not let a bad situation be your excuse to have a cigarette. Some situations in your life might tempt you into wanting a cigarette.  Never have "just one" cigarette. It leads to wanting another and another. Remind yourself of your decision to quit.  Change habits associated with smoking. If you smoked while driving or when feeling  stressed, try other activities to replace smoking. Stand up when drinking your coffee. Brush your teeth after eating. Sit in a different chair when you read the paper. Avoid alcohol while trying to quit, and try to drink fewer caffeinated beverages. Alcohol and caffeine may urge you to smoke.  Avoid foods and drinks that can trigger a desire to smoke, such as sugary or spicy foods and alcohol.  Ask people who smoke not to smoke around you.  Have something planned to do right after eating or having a cup of coffee. For example, plan to take a walk or exercise.  Try a relaxation exercise to calm you down and decrease your stress. Remember, you may be tense and nervous for the first 2 weeks after you quit, but this will pass.  Find new activities to keep your hands busy. Play with a pen, coin, or rubber band. Doodle or draw things on paper.  Brush your teeth right after eating. This will help cut down on the craving for the taste of tobacco after meals. You can also try mouthwash.   Use oral substitutes in place of cigarettes. Try using lemon drops, carrots, cinnamon sticks, or chewing gum. Keep them handy so they are available when you have the urge to smoke.  When you have the urge to smoke, try deep breathing.  Designate your home as a nonsmoking area.  If you are a heavy smoker, ask your health care provider about a prescription for nicotine chewing gum. It can ease your withdrawal from nicotine.  Reward yourself. Set aside the cigarette money you save and buy yourself something nice.  Look for support from others. Join a support group or smoking cessation program. Ask someone at home or at work to help you with your plan to quit smoking.  Always ask yourself, "Do I need this cigarette or is this just a reflex?" Tell yourself, "Today, I choose not to smoke," or "I do not want to smoke." You are reminding yourself of your decision to quit.  Do not replace cigarette smoking with  electronic cigarettes (commonly called e-cigarettes). The safety of e-cigarettes is unknown, and some may contain harmful chemicals.  If you relapse, do not give up! Plan ahead and think about what you will do the next time you get the urge to smoke. HOW WILL I FEEL WHEN I QUIT SMOKING? You may have symptoms of withdrawal because your body is used to nicotine (the addictive substance in cigarettes). You may crave cigarettes, be irritable, feel very hungry, cough often, get headaches, or have difficulty concentrating. The withdrawal symptoms are only temporary. They are strongest when you first quit but will go away within 10-14 days. When withdrawal symptoms occur, stay in control. Think about your reasons for quitting. Remind yourself that these are signs that your body is healing and getting used to being without cigarettes. Remember that withdrawal symptoms are easier to treat than the major diseases that smoking can cause.  Even  after the withdrawal is over, expect periodic urges to smoke. However, these cravings are generally short lived and will go away whether you smoke or not. Do not smoke! WHAT RESOURCES ARE AVAILABLE TO HELP ME QUIT SMOKING? Your health care provider can direct you to community resources or hospitals for support, which may include:  Group support.  Education.  Hypnosis.  Therapy.   This information is not intended to replace advice given to you by your health care provider. Make sure you discuss any questions you have with your health care provider.   Document Released: 07/18/2004 Document Revised: 11/10/2014 Document Reviewed: 04/07/2013 Elsevier Interactive Patient Education [employer].

## 2016-01-22 ENCOUNTER — Telehealth: Payer: Self-pay | Admitting: Internal Medicine

## 2016-01-22 DIAGNOSIS — E11621 Type 2 diabetes mellitus with foot ulcer: Secondary | ICD-10-CM | POA: Diagnosis not present

## 2016-01-22 LAB — LIPID PANEL
Chol/HDL Ratio: 7.5 ratio units — ABNORMAL HIGH (ref 0.0–4.4)
Cholesterol, Total: 211 mg/dL — ABNORMAL HIGH (ref 100–199)
HDL: 28 mg/dL — ABNORMAL LOW (ref >39)
LDL Calculated: 115 mg/dL — ABNORMAL HIGH (ref 0–99)
Triglycerides: 342 mg/dL — ABNORMAL HIGH (ref 0–149)
VLDL Cholesterol Cal: 68 mg/dL — ABNORMAL HIGH (ref 5–40)

## 2016-01-22 NOTE — Telephone Encounter (Signed)
Pt was given to pharmacist RON to help with a rx cost

## 2016-01-23 ENCOUNTER — Other Ambulatory Visit: Payer: Self-pay | Admitting: Internal Medicine

## 2016-01-23 DIAGNOSIS — R0989 Other specified symptoms and signs involving the circulatory and respiratory systems: Secondary | ICD-10-CM

## 2016-01-23 NOTE — Assessment & Plan Note (Signed)
A: HTN: BP slightly elevated today likely d/t pain. P: -cont current meds , may need to increase at next OV -cont to monitor

## 2016-01-23 NOTE — Assessment & Plan Note (Addendum)
A: DMII: Ha1c 7.6 today.  She report compliance with metformin. P: -will d/c metformin  -will start janumet XR  -retinal scan ordered today  -return in 6 weeks

## 2016-01-23 NOTE — Assessment & Plan Note (Addendum)
A: Tobacco abuse P: -discussed smoking cessation and is given a nicotine patch today

## 2016-01-24 ENCOUNTER — Other Ambulatory Visit: Payer: Self-pay | Admitting: Internal Medicine

## 2016-01-24 ENCOUNTER — Other Ambulatory Visit (HOSPITAL_COMMUNITY): Payer: Self-pay | Admitting: Cardiology

## 2016-01-24 DIAGNOSIS — L97511 Non-pressure chronic ulcer of other part of right foot limited to breakdown of skin: Secondary | ICD-10-CM

## 2016-01-24 NOTE — Progress Notes (Signed)
Internal Medicine Clinic Attending  Case discussed with Dr. Gill soon after the resident saw the patient.  We reviewed the resident's history and exam and pertinent patient test results.  I agree with the assessment, diagnosis, and plan of care documented in the resident's note.  

## 2016-01-25 ENCOUNTER — Inpatient Hospital Stay (HOSPITAL_COMMUNITY): Admission: RE | Admit: 2016-01-25 | Payer: BLUE CROSS/BLUE SHIELD | Source: Ambulatory Visit

## 2016-01-25 ENCOUNTER — Telehealth: Payer: Self-pay | Admitting: *Deleted

## 2016-01-25 ENCOUNTER — Encounter: Payer: Self-pay | Admitting: Internal Medicine

## 2016-01-25 ENCOUNTER — Telehealth: Payer: Self-pay | Admitting: Internal Medicine

## 2016-01-25 DIAGNOSIS — E118 Type 2 diabetes mellitus with unspecified complications: Secondary | ICD-10-CM

## 2016-01-25 NOTE — Telephone Encounter (Signed)
Pt calls and states she spoke w/ someone mon and they were supposed to get the doctor to possibly order something else because the sitagliptin/metformin co-pay is $94.00 and she cannot afford this Please advise Sending to dr kim and dr gill

## 2016-01-25 NOTE — Telephone Encounter (Signed)
Needs to speak to nurse about pt

## 2016-01-28 ENCOUNTER — Other Ambulatory Visit: Payer: Self-pay | Admitting: Pharmacist

## 2016-01-28 NOTE — Telephone Encounter (Signed)
Spoke with Rosemarie Ax, confirmed diagnosis for FMLA paperwork, however, referred her to [facility] for further information on return to work etc.

## 2016-01-28 NOTE — Telephone Encounter (Signed)
Hi Dr. Gordy Levan, patient's insurance is tricky. I found they do cover the XR version of metformin 750 mg as Tier 1. I spoke to patient and counseled her on concern with metformin side effects with advanced doses and found out patient does not always eat breakfast with her morning dose of metformin. Advised patient to take metformin doses with a full meal. Patient is willing to try.  If okay with you, I can change the Rx to the metformin XR 750 mg BID which may be sufficient. Please let me know. Thank you.

## 2016-01-30 DIAGNOSIS — E11621 Type 2 diabetes mellitus with foot ulcer: Secondary | ICD-10-CM | POA: Diagnosis not present

## 2016-02-03 ENCOUNTER — Encounter (HOSPITAL_BASED_OUTPATIENT_CLINIC_OR_DEPARTMENT_OTHER): Payer: Self-pay | Admitting: Emergency Medicine

## 2016-02-03 ENCOUNTER — Emergency Department (HOSPITAL_BASED_OUTPATIENT_CLINIC_OR_DEPARTMENT_OTHER)
Admission: EM | Admit: 2016-02-03 | Discharge: 2016-02-04 | Disposition: A | Discharge status: Home / Self Care | Payer: BLUE CROSS/BLUE SHIELD | Attending: Emergency Medicine | Admitting: Emergency Medicine

## 2016-02-03 DIAGNOSIS — E785 Hyperlipidemia, unspecified: Secondary | ICD-10-CM | POA: Insufficient documentation

## 2016-02-03 DIAGNOSIS — F17210 Nicotine dependence, cigarettes, uncomplicated: Secondary | ICD-10-CM | POA: Diagnosis not present

## 2016-02-03 DIAGNOSIS — Z8781 Personal history of (healed) traumatic fracture: Secondary | ICD-10-CM | POA: Insufficient documentation

## 2016-02-03 DIAGNOSIS — Z86018 Personal history of other benign neoplasm: Secondary | ICD-10-CM | POA: Diagnosis not present

## 2016-02-03 DIAGNOSIS — Z7984 Long term (current) use of oral hypoglycemic drugs: Secondary | ICD-10-CM | POA: Diagnosis not present

## 2016-02-03 DIAGNOSIS — Z79899 Other long term (current) drug therapy: Secondary | ICD-10-CM | POA: Diagnosis not present

## 2016-02-03 DIAGNOSIS — Z87440 Personal history of urinary (tract) infections: Secondary | ICD-10-CM | POA: Insufficient documentation

## 2016-02-03 DIAGNOSIS — Z87828 Personal history of other (healed) physical injury and trauma: Secondary | ICD-10-CM | POA: Diagnosis not present

## 2016-02-03 DIAGNOSIS — E669 Obesity, unspecified: Secondary | ICD-10-CM | POA: Diagnosis not present

## 2016-02-03 DIAGNOSIS — Z862 Personal history of diseases of the blood and blood-forming organs and certain disorders involving the immune mechanism: Secondary | ICD-10-CM | POA: Insufficient documentation

## 2016-02-03 DIAGNOSIS — E119 Type 2 diabetes mellitus without complications: Secondary | ICD-10-CM | POA: Diagnosis not present

## 2016-02-03 DIAGNOSIS — M545 Low back pain: Secondary | ICD-10-CM | POA: Diagnosis present

## 2016-02-03 DIAGNOSIS — M5431 Sciatica, right side: Secondary | ICD-10-CM | POA: Diagnosis not present

## 2016-02-03 MED ORDER — ONDANSETRON 4 MG PO TBDP
4.0000 mg | ORAL_TABLET | Freq: Once | ORAL | Status: AC
  Administered 2016-02-03: 4 mg via ORAL
  Filled 2016-02-03: qty 1

## 2016-02-03 MED ORDER — PREDNISONE 50 MG PO TABS
60.0000 mg | ORAL_TABLET | Freq: Once | ORAL | Status: AC
  Administered 2016-02-03: 60 mg via ORAL
  Filled 2016-02-03: qty 1

## 2016-02-03 MED ORDER — IBUPROFEN 800 MG PO TABS: 800.0000 mg | ORAL_TABLET | Freq: Three times a day (TID) | ORAL | Status: DC | PRN

## 2016-02-03 MED ORDER — PREDNISONE 10 MG (21) PO TBPK: 10.0000 mg | ORAL_TABLET | Freq: Every day | ORAL | Status: DC

## 2016-02-03 MED ORDER — HYDROMORPHONE HCL 1 MG/ML IJ SOLN
1.0000 mg | Freq: Once | INTRAMUSCULAR | Status: AC
  Administered 2016-02-03: 1 mg via INTRAMUSCULAR
  Filled 2016-02-03: qty 1

## 2016-02-03 MED ORDER — IBUPROFEN 800 MG PO TABS
800.0000 mg | ORAL_TABLET | Freq: Once | ORAL | Status: AC
  Administered 2016-02-03: 800 mg via ORAL
  Filled 2016-02-03: qty 1
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
## 2016-02-03 NOTE — ED Notes (Signed)
45 yo with severe lower back pain that started yesterday. Gait is slow and steady. NAD.

## 2016-02-03 NOTE — Telephone Encounter (Signed)
Sure, that is fine to change her to the metformin XR.  Thanks.

## 2016-02-03 NOTE — Discharge Instructions (Signed)
You may take your home Vicodin 5-10 mg every 6 hours as needed for pain. Please do not drive, drink alcohol, operate machinery or make important decisions when taking this medication.  Sciatica Sciatica is pain, weakness, numbness, or tingling along the path of the sciatic nerve. The nerve starts in the lower back and runs down the back of each leg. The nerve controls the muscles in the lower leg and in the back of the knee, while also providing sensation to the back of the thigh, lower leg, and the sole of your foot. Sciatica is a symptom of another medical condition. For instance, nerve damage or certain conditions, such as a herniated disk or bone spur on the spine, pinch or put pressure on the sciatic nerve. This causes the pain, weakness, or other sensations normally associated with sciatica. Generally, sciatica only affects one side of the body. CAUSES   Herniated or slipped disc.  Degenerative disk disease.  A pain disorder involving the narrow muscle in the buttocks (piriformis syndrome).  Pelvic injury or fracture.  Pregnancy.  Tumor (rare). SYMPTOMS  Symptoms can vary from mild to very severe. The symptoms usually travel from the low back to the buttocks and down the back of the leg. Symptoms can include:  Mild tingling or dull aches in the lower back, leg, or hip.  Numbness in the back of the calf or sole of the foot.  Burning sensations in the lower back, leg, or hip.  Sharp pains in the lower back, leg, or hip.  Leg weakness.  Severe back pain inhibiting movement. These symptoms may get worse with coughing, sneezing, laughing, or prolonged sitting or standing. Also, being overweight may worsen symptoms. DIAGNOSIS  Your caregiver will perform a physical exam to look for common symptoms of sciatica. He or she may ask you to do certain movements or activities that would trigger sciatic nerve pain. Other tests may be performed to find the cause of the sciatica. These may  include:  Blood tests.  X-rays.  Imaging tests, such as an MRI or CT scan. TREATMENT  Treatment is directed at the cause of the sciatic pain. Sometimes, treatment is not necessary and the pain and discomfort goes away on its own. If treatment is needed, your caregiver may suggest:  Over-the-counter medicines to relieve pain.  Prescription medicines, such as anti-inflammatory medicine, muscle relaxants, or narcotics.  Applying heat or ice to the painful area.  Steroid injections to lessen pain, irritation, and inflammation around the nerve.  Reducing activity during periods of pain.  Exercising and stretching to strengthen your abdomen and improve flexibility of your spine. Your caregiver may suggest losing weight if the extra weight makes the back pain worse.  Physical therapy.  Surgery to eliminate what is pressing or pinching the nerve, such as a bone spur or part of a herniated disk. HOME CARE INSTRUCTIONS   Only take over-the-counter or prescription medicines for pain or discomfort as directed by your caregiver.  Apply ice to the affected area for 20 minutes, 3-4 times a day for the first 48-72 hours. Then try heat in the same way.  Exercise, stretch, or perform your usual activities if these do not aggravate your pain.  Attend physical therapy sessions as directed by your caregiver.  Keep all follow-up appointments as directed by your caregiver.  Do not wear high heels or shoes that do not provide proper support.  Check your mattress to see if it is too soft. A firm mattress may  lessen your pain and discomfort. SEEK IMMEDIATE MEDICAL CARE IF:   You lose control of your bowel or bladder (incontinence).  You have increasing weakness in the lower back, pelvis, buttocks, or legs.  You have redness or swelling of your back.  You have a burning sensation when you urinate.  You have pain that gets worse when you lie down or awakens you at night.  Your pain is worse  than you have experienced in the past.  Your pain is lasting longer than 4 weeks.  You are suddenly losing weight without reason. MAKE SURE YOU:  Understand these instructions.  Will watch your condition.  Will get help right away if you are not doing well or get worse.   This information is not intended to replace advice given to you by your health care provider. Make sure you discuss any questions you have with your health care provider.   Document Released: 10/14/2001 Document Revised: 07/11/2015 Document Reviewed: 02/29/2012 Elsevier Interactive Patient Education [employer].

## 2016-02-04 ENCOUNTER — Other Ambulatory Visit: Payer: Self-pay | Admitting: Pharmacist

## 2016-02-04 DIAGNOSIS — E119 Type 2 diabetes mellitus without complications: Secondary | ICD-10-CM

## 2016-02-04 DIAGNOSIS — E785 Hyperlipidemia, unspecified: Secondary | ICD-10-CM

## 2016-02-04 DIAGNOSIS — I10 Essential (primary) hypertension: Secondary | ICD-10-CM

## 2016-02-04 MED ORDER — METFORMIN HCL ER 750 MG PO TB24: 750.0000 mg | ORAL_TABLET | Freq: Two times a day (BID) | ORAL | Status: DC

## 2016-02-04 MED ORDER — ONDANSETRON 4 MG PO TBDP: 4.0000 mg | ORAL_TABLET | Freq: Three times a day (TID) | ORAL | Status: DC | PRN

## 2016-02-04 NOTE — Addendum Note (Signed)
Addended by: Forde Dandy on: 02/04/2016 02:37 PM   Modules accepted: Orders, Medications

## 2016-02-04 NOTE — ED Notes (Signed)
Pt requested antiemetic for her home vicodin, states MD said she would write for it. MD is seeing other patients currently and pt informed that it may be a few minutes before script can be written, verbalizes understanding.

## 2016-02-04 NOTE — ED Notes (Signed)
Steri dose pack prescription verified, per Brent

## 2016-02-05 MED ORDER — PRAVASTATIN SODIUM 40 MG PO TABS: 40.0000 mg | ORAL_TABLET | Freq: Every evening | ORAL | Status: DC

## 2016-02-05 MED ORDER — ENALAPRIL MALEATE 10 MG PO TABS: 10.0000 mg | ORAL_TABLET | Freq: Every day | ORAL | Status: DC

## 2016-02-05 MED ORDER — GLIPIZIDE 5 MG PO TABS: 5.0000 mg | ORAL_TABLET | Freq: Two times a day (BID) | ORAL | Status: DC

## 2016-02-06 ENCOUNTER — Encounter (HOSPITAL_BASED_OUTPATIENT_CLINIC_OR_DEPARTMENT_OTHER): Payer: BLUE CROSS/BLUE SHIELD | Attending: Surgery

## 2016-02-06 DIAGNOSIS — F17210 Nicotine dependence, cigarettes, uncomplicated: Secondary | ICD-10-CM | POA: Insufficient documentation

## 2016-02-06 DIAGNOSIS — L97811 Non-pressure chronic ulcer of other part of right lower leg limited to breakdown of skin: Secondary | ICD-10-CM | POA: Insufficient documentation

## 2016-02-06 DIAGNOSIS — I10 Essential (primary) hypertension: Secondary | ICD-10-CM | POA: Insufficient documentation

## 2016-02-06 DIAGNOSIS — E11621 Type 2 diabetes mellitus with foot ulcer: Secondary | ICD-10-CM | POA: Insufficient documentation

## 2016-02-07 ENCOUNTER — Encounter: Payer: Self-pay | Admitting: Dietician

## 2016-02-07 ENCOUNTER — Other Ambulatory Visit: Payer: Self-pay | Admitting: Internal Medicine

## 2016-02-07 DIAGNOSIS — L97511 Non-pressure chronic ulcer of other part of right foot limited to breakdown of skin: Secondary | ICD-10-CM

## 2016-02-08 ENCOUNTER — Ambulatory Visit: Payer: BLUE CROSS/BLUE SHIELD

## 2016-02-08 ENCOUNTER — Ambulatory Visit (INDEPENDENT_AMBULATORY_CARE_PROVIDER_SITE_OTHER): Payer: BLUE CROSS/BLUE SHIELD

## 2016-02-08 ENCOUNTER — Ambulatory Visit (INDEPENDENT_AMBULATORY_CARE_PROVIDER_SITE_OTHER): Payer: BLUE CROSS/BLUE SHIELD | Admitting: Podiatry

## 2016-02-08 ENCOUNTER — Encounter: Payer: Self-pay | Admitting: Podiatry

## 2016-02-08 VITALS — BP 135/72 | HR 86 | Temp 99.0°F | Resp 18

## 2016-02-08 DIAGNOSIS — R609 Edema, unspecified: Secondary | ICD-10-CM

## 2016-02-08 DIAGNOSIS — M79671 Pain in right foot: Secondary | ICD-10-CM | POA: Diagnosis not present

## 2016-02-08 DIAGNOSIS — L97511 Non-pressure chronic ulcer of other part of right foot limited to breakdown of skin: Secondary | ICD-10-CM

## 2016-02-08 DIAGNOSIS — R52 Pain, unspecified: Secondary | ICD-10-CM

## 2016-02-08 MED ORDER — AMOXICILLIN-POT CLAVULANATE 875-125 MG PO TABS: 1.0000 | ORAL_TABLET | Freq: Two times a day (BID) | ORAL | Status: DC

## 2016-02-08 NOTE — Progress Notes (Signed)
   Subjective:    [name], female    DOB: 03-Oct-1971, 45 y.o.   MRN: [id number]  HPI  48 shell female presents the also concerns of wound of the right foot which has been ongoing for the last several weeks. She has been seen by her primary care physician as well as the wound care center for this. She says the area is very tender in the wound care center could not treatment were clean the wound due to the pain. She also has noticed that she has had some swelling around this area recently. She is calling not taking any antibiotics. She is unsure for the wound started.She has an appointment to get arterial study performed on Monday.   Review of Systems  All other systems reviewed and are negative.      Objective:   Physical Exam General: AAO x3, NAD  Dermatological: There is a wound present on the medial aspects of the right foot along the instep just distal to the ankle on the medial foot measuring 3 x 1 cm and appears to be superficial. There is quite a bit of tenderness to the overlying the wound. The wound borders are sharp. The wound is well demarcated. There is no surrounding hyperkeratotic tissue. There is localized edema around the area that any erythema or a sitting cellulitis. Is no fluctuance or crepitus. No drainage or pus. No malodor. No other open lesions or pre-ulcer lesions identified at this time.  Vascular: Dorsalis Pedis artery and Posterior Tibial artery pedal pulses are 2/4 bilateral with immedate capillary fill time. Pedal hair growth present. No varicosities and no lower extremity edema present bilateral. There is no pain with calf compression, swelling, warmth, erythema.   Neruologic: Grossly intact via light touch bilateral. Vibratory intact via tuning fork bilateral. Protective threshold with Semmes Wienstein monofilament intact to all pedal sites bilateral. Patellar and Achilles deep tendon reflexes 2+ bilateral. No Babinski or clonus noted  bilateral.   Musculoskeletal: No gross boney pedal deformities bilateral. No pain, crepitus, or limitation noted with foot and ankle range of motion bilateral. Muscular strength 5/5 in all groups tested bilateral.  Gait: Unassisted, Nonantalgic.      Assessment & Plan:  45 year old female right foot ulceration with significant pain to the area -Treatment options discussed including all alternatives, risks, and complications -X-rays were obtained and reviewed with the patient. No definitive evidence of cortical changes in soft tissue emphysema is not present. There is a hardware the first metatarsal from previous surgery but does not overlying the area of the ulceration. -Etiology of symptoms were discussed -Arterial study scheduled for Monday. -Due to the swelling as well as the pain will start Augmentin case of infection. -Due to the nature of the wound and the significant pain we'll obtain an MRI to rule out osteomyelitis and or foreign body. -Follow-up as scheduled. -Monitor for any clinical signs or symptoms of infection and directed to call the office immediately should any occur or go to the ER.  Celesta Gentile, DPM

## 2016-02-11 ENCOUNTER — Ambulatory Visit (HOSPITAL_COMMUNITY)
Admission: RE | Admit: 2016-02-11 | Discharge: 2016-02-11 | Disposition: A | Discharge status: Home / Self Care | Payer: BLUE CROSS/BLUE SHIELD | Source: Ambulatory Visit | Attending: Cardiology | Admitting: Cardiology

## 2016-02-11 DIAGNOSIS — L97511 Non-pressure chronic ulcer of other part of right foot limited to breakdown of skin: Secondary | ICD-10-CM | POA: Diagnosis not present

## 2016-02-11 DIAGNOSIS — I10 Essential (primary) hypertension: Secondary | ICD-10-CM | POA: Diagnosis not present

## 2016-02-11 DIAGNOSIS — E785 Hyperlipidemia, unspecified: Secondary | ICD-10-CM | POA: Diagnosis not present

## 2016-02-11 DIAGNOSIS — E119 Type 2 diabetes mellitus without complications: Secondary | ICD-10-CM | POA: Insufficient documentation

## 2016-02-13 ENCOUNTER — Telehealth: Payer: Self-pay | Admitting: *Deleted

## 2016-02-13 DIAGNOSIS — M79671 Pain in right foot: Secondary | ICD-10-CM

## 2016-02-13 DIAGNOSIS — L97511 Non-pressure chronic ulcer of other part of right foot limited to breakdown of skin: Secondary | ICD-10-CM

## 2016-02-13 DIAGNOSIS — R609 Edema, unspecified: Secondary | ICD-10-CM

## 2016-02-13 NOTE — Telephone Encounter (Addendum)
-----   Message from [name], DPM sent at 02/12/2016  5:56 PM EDT ----- Can you order an MRI of the right foot/ankle to evaluate for osteomyelitis and foreign body. The wound she has is on the medial foot just below the ankle. They may be able to just do a foot view. Thanks. 02/13/2016-[employer] REPRESENTATIVE, JAMILA STATES NO PRIOR AUTHORIZATION IS NEEDED FOR 09811 RIGHT FOOT, REFERENCE # [id number].

## 2016-02-13 NOTE — Telephone Encounter (Signed)
PATIENT CALLED WANTING REFERRAL TO NEUROLOGY. SAYS THAT THE WOUND CENTER IS WANTING HER PCP TO PUT IN REFERRAL. REQUEST SENT TO DR. GILL.

## 2016-02-13 NOTE — Telephone Encounter (Deleted)
-----   Message from [name], DPM sent at 02/12/2016  5:56 PM EDT ----- Can you order an MRI of the right foot/ankle to evaluate for osteomyelitis and foreign body. The wound she has is on the medial foot just below the ankle. They may be able to just do a foot view. Thanks.

## 2016-02-20 DIAGNOSIS — E11621 Type 2 diabetes mellitus with foot ulcer: Secondary | ICD-10-CM | POA: Diagnosis present

## 2016-02-20 DIAGNOSIS — L97811 Non-pressure chronic ulcer of other part of right lower leg limited to breakdown of skin: Secondary | ICD-10-CM | POA: Diagnosis not present

## 2016-02-20 DIAGNOSIS — I10 Essential (primary) hypertension: Secondary | ICD-10-CM | POA: Diagnosis not present

## 2016-02-20 DIAGNOSIS — F17210 Nicotine dependence, cigarettes, uncomplicated: Secondary | ICD-10-CM | POA: Diagnosis not present

## 2016-02-22 ENCOUNTER — Encounter: Payer: Self-pay | Admitting: Podiatry

## 2016-02-22 ENCOUNTER — Ambulatory Visit (INDEPENDENT_AMBULATORY_CARE_PROVIDER_SITE_OTHER): Payer: BLUE CROSS/BLUE SHIELD | Admitting: Podiatry

## 2016-02-22 VITALS — BP 139/86 | HR 92 | Resp 18

## 2016-02-22 DIAGNOSIS — M79671 Pain in right foot: Secondary | ICD-10-CM | POA: Diagnosis not present

## 2016-02-22 DIAGNOSIS — L97519 Non-pressure chronic ulcer of other part of right foot with unspecified severity: Secondary | ICD-10-CM | POA: Diagnosis not present

## 2016-02-22 MED ORDER — OXYCODONE-ACETAMINOPHEN 5-325 MG PO TABS: 1.0000 | ORAL_TABLET | Freq: Four times a day (QID) | ORAL | Status: DC | PRN

## 2016-02-25 ENCOUNTER — Ambulatory Visit
Admission: RE | Admit: 2016-02-25 | Discharge: 2016-02-25 | Disposition: A | Discharge status: Home / Self Care | Payer: BLUE CROSS/BLUE SHIELD | Source: Ambulatory Visit | Attending: Podiatry | Admitting: Podiatry

## 2016-02-25 DIAGNOSIS — L97511 Non-pressure chronic ulcer of other part of right foot limited to breakdown of skin: Secondary | ICD-10-CM

## 2016-02-25 DIAGNOSIS — M79671 Pain in right foot: Secondary | ICD-10-CM

## 2016-02-25 DIAGNOSIS — R609 Edema, unspecified: Secondary | ICD-10-CM

## 2016-02-25 MED ORDER — GADOBENATE DIMEGLUMINE 529 MG/ML IV SOLN
20.0000 mL | Freq: Once | INTRAVENOUS | Status: AC | PRN
  Administered 2016-02-25: 20 mL via INTRAVENOUS

## 2016-02-25 NOTE — Progress Notes (Signed)
[name], female   DOB: 04/23/1971, 45 y.o.   MRN: [id number]  Subjective: 45 year-old female presents the office today for follow up evaluation of a wound to the side of her right foot. She states that the area does continue to be very painful. She did get her vascular studies performed she is scheduled for an MRI on Monday. She denies any redness or drainage from the wound. The swelling has improved. She does continue to follow with the wound care center as well. Denies any systemic complaints such as fevers, chills, nausea, vomiting. No acute changes since last appointment, and no other complaints at this time.   Objective: AAO x3, NAD DP/PT pulses palpable bilaterally, CRT less than 3 seconds Protective sensation intact with Simms Weinstein monofilament Wound present on medial aspect of the left foot at approximately the talonavicular joint medially. There is very tender to palpation. There is no drainage or pus. There is decreased edema compared to last appointment. There is no swelling erythema or ascending cellulitis. No other open lesions or pre-ulcerative lesions. No areas of pinpoint bony tenderness or pain with vibratory sensation. MMT 5/5, ROM WNL. Flatfoot deformity present. No edema, erythema, increase in warmth to bilateral lower extremities.  No open lesions or pre-ulcerative lesions.  No pain with calf compression, swelling, warmth, erythema  Assessment: Ongoing ulceration medial right foot.  Plan: -All treatment options discussed with the patient including all alternatives, risks, complications.  -Finish course of Vioxx. -Vascular studies reviewed the patient which were within normal limits. ABI on the left 1.1 on the right 1.1. No evidence of segmental lower extremity arterial disease arise bilaterally. -Continue daily dressing changes per wound care. -Scheduled for MRI Monday -Monitor for any clinical signs or symptoms of infection and directed to  call the office immediately should any occur or go to the ER. -Follow-up next week or sooner if any problems arise. In the meantime, encouraged to call the office with any questions, concerns, change in symptoms.   Celesta Gentile, DPM

## 2016-02-26 LAB — CBC WITH DIFFERENTIAL/PLATELET
BASOS ABS: 0 {cells}/uL (ref 0–200)
Basophils Relative: 0 %
EOS ABS: 162 {cells}/uL (ref 15–500)
Eosinophils Relative: 2 %
HCT: 36.9 % (ref 35.0–45.0)
Hemoglobin: 12 g/dL (ref 11.7–15.5)
Lymphocytes Relative: 30 %
Lymphs Abs: 2430 cells/uL (ref 850–3900)
MCH: 26.8 pg — AB (ref 27.0–33.0)
MCHC: 32.5 g/dL (ref 32.0–36.0)
MCV: 82.6 fL (ref 80.0–100.0)
MONOS PCT: 6 %
MPV: 10.5 fL (ref 7.5–12.5)
Monocytes Absolute: 486 cells/uL (ref 200–950)
NEUTROS ABS: 5022 {cells}/uL (ref 1500–7800)
NEUTROS PCT: 62 %
PLATELETS: 378 10*3/uL (ref 140–400)
RBC: 4.47 MIL/uL (ref 3.80–5.10)
RDW: 14 % (ref 11.0–15.0)
WBC: 8.1 10*3/uL (ref 3.8–10.8)

## 2016-02-26 LAB — HEMOGLOBIN A1C
HEMOGLOBIN A1C: 9.9 % — AB (ref <5.7)
MEAN PLASMA GLUCOSE: 237 mg/dL

## 2016-02-26 NOTE — Addendum Note (Signed)
Addended by: Truddie Crumble on: 02/26/2016 08:57 AM   Modules accepted: Orders

## 2016-02-27 LAB — SEDIMENTATION RATE: Sed Rate: 17 mm/hr (ref 0–20)

## 2016-02-27 LAB — C-REACTIVE PROTEIN: CRP: 0.9 mg/dL — AB (ref <0.60)

## 2016-02-28 NOTE — Telephone Encounter (Addendum)
-----   Message from [name], DPM sent at 02/27/2016  5:33 PM EDT ----- Can you let her know that her inflammation markers have improved but A1c is very high. She needs to get in with her PCP.  02/28/2016-Left message on pt's mobile and pt's home phone for pt to call our office for results. Pt returned my call and I informed pt of Dr. Leigh Aurora recommendation for her to go to her PCP.  Pt states she would like the results sent to the [facility], but she thinks they can see Dr. Leigh Aurora notes and results and to Dr. Michail Jewels at [facility].  Faxed to Dr. Gordy Levan.

## 2016-03-04 DIAGNOSIS — L97519 Non-pressure chronic ulcer of other part of right foot with unspecified severity: Secondary | ICD-10-CM

## 2016-03-05 ENCOUNTER — Encounter (HOSPITAL_BASED_OUTPATIENT_CLINIC_OR_DEPARTMENT_OTHER): Payer: BLUE CROSS/BLUE SHIELD | Attending: Surgery

## 2016-03-05 DIAGNOSIS — D649 Anemia, unspecified: Secondary | ICD-10-CM | POA: Insufficient documentation

## 2016-03-05 DIAGNOSIS — E669 Obesity, unspecified: Secondary | ICD-10-CM | POA: Diagnosis not present

## 2016-03-05 DIAGNOSIS — Z79899 Other long term (current) drug therapy: Secondary | ICD-10-CM | POA: Insufficient documentation

## 2016-03-05 DIAGNOSIS — E11621 Type 2 diabetes mellitus with foot ulcer: Secondary | ICD-10-CM | POA: Diagnosis present

## 2016-03-05 DIAGNOSIS — Z6835 Body mass index (BMI) 35.0-35.9, adult: Secondary | ICD-10-CM | POA: Insufficient documentation

## 2016-03-05 DIAGNOSIS — D509 Iron deficiency anemia, unspecified: Secondary | ICD-10-CM | POA: Diagnosis not present

## 2016-03-05 DIAGNOSIS — Z7984 Long term (current) use of oral hypoglycemic drugs: Secondary | ICD-10-CM | POA: Diagnosis not present

## 2016-03-05 DIAGNOSIS — E785 Hyperlipidemia, unspecified: Secondary | ICD-10-CM | POA: Diagnosis not present

## 2016-03-05 DIAGNOSIS — I10 Essential (primary) hypertension: Secondary | ICD-10-CM | POA: Insufficient documentation

## 2016-03-05 DIAGNOSIS — F17210 Nicotine dependence, cigarettes, uncomplicated: Secondary | ICD-10-CM | POA: Insufficient documentation

## 2016-03-05 DIAGNOSIS — L97512 Non-pressure chronic ulcer of other part of right foot with fat layer exposed: Secondary | ICD-10-CM | POA: Insufficient documentation

## 2016-03-07 ENCOUNTER — Encounter: Payer: Self-pay | Admitting: Podiatry

## 2016-03-07 ENCOUNTER — Ambulatory Visit (INDEPENDENT_AMBULATORY_CARE_PROVIDER_SITE_OTHER): Payer: BLUE CROSS/BLUE SHIELD | Admitting: Podiatry

## 2016-03-07 DIAGNOSIS — M79671 Pain in right foot: Secondary | ICD-10-CM | POA: Diagnosis not present

## 2016-03-07 DIAGNOSIS — L97519 Non-pressure chronic ulcer of other part of right foot with unspecified severity: Secondary | ICD-10-CM | POA: Diagnosis not present

## 2016-03-10 ENCOUNTER — Ambulatory Visit (INDEPENDENT_AMBULATORY_CARE_PROVIDER_SITE_OTHER): Payer: BLUE CROSS/BLUE SHIELD | Admitting: Internal Medicine

## 2016-03-10 ENCOUNTER — Other Ambulatory Visit: Payer: Self-pay | Admitting: Internal Medicine

## 2016-03-10 ENCOUNTER — Encounter: Payer: Self-pay | Admitting: Internal Medicine

## 2016-03-10 VITALS — BP 137/87 | HR 87 | Temp 97.9°F | Wt 293.1 lb

## 2016-03-10 DIAGNOSIS — E11621 Type 2 diabetes mellitus with foot ulcer: Secondary | ICD-10-CM | POA: Diagnosis not present

## 2016-03-10 DIAGNOSIS — Z7984 Long term (current) use of oral hypoglycemic drugs: Secondary | ICD-10-CM

## 2016-03-10 DIAGNOSIS — F17210 Nicotine dependence, cigarettes, uncomplicated: Secondary | ICD-10-CM

## 2016-03-10 DIAGNOSIS — L97509 Non-pressure chronic ulcer of other part of unspecified foot with unspecified severity: Secondary | ICD-10-CM

## 2016-03-10 DIAGNOSIS — E1121 Type 2 diabetes mellitus with diabetic nephropathy: Secondary | ICD-10-CM

## 2016-03-10 DIAGNOSIS — E785 Hyperlipidemia, unspecified: Secondary | ICD-10-CM

## 2016-03-10 DIAGNOSIS — Z79899 Other long term (current) drug therapy: Secondary | ICD-10-CM

## 2016-03-10 MED ORDER — PRAVASTATIN SODIUM 40 MG PO TABS: 40.0000 mg | ORAL_TABLET | Freq: Every evening | ORAL | Status: DC

## 2016-03-10 MED ORDER — SITAGLIPTIN PHOSPHATE 100 MG PO TABS: 100.0000 mg | ORAL_TABLET | Freq: Every day | ORAL | Status: DC

## 2016-03-10 MED ORDER — METFORMIN HCL 1000 MG PO TABS: 1000.0000 mg | ORAL_TABLET | Freq: Every day | ORAL | Status: DC

## 2016-03-10 NOTE — Progress Notes (Signed)
[name], female   DOB: 01-27-1971, 45 y.o.   MRN: [id number]  Subjective: 45 year-old female presents the office today for follow up evaluation of a wound to the side of her right foot. She presents a discussed MRI results as well. She has been going to the wound care center and she states that the wound has improved substantially since starting treatment with Dr. Con Memos. She denies any surrounding redness or red streaks any drainage or pus. The pain has improved as well. Denies any systemic complaints such as fevers, chills, nausea, vomiting. No acute changes since last appointment, and no other complaints at this time.   Objective: AAO x3, NAD DP/PT pulses palpable bilaterally, CRT less than 3 seconds Protective sensation intact with Simms Weinstein monofilament Wound present on medial aspect of the left foot at approximately the talonavicular joint medially. Today the wound measures approximately 1 x 0.5 cm with a granular wound base. There is no probing, undermining or tunneling. There is decreased edema around the area and there is no surrounding erythema, ascending cellulitis, fluctuance, crepitus, malodor, drainage or pus. No edema, erythema, increase in warmth to bilateral lower extremities.  No open lesions or pre-ulcerative lesions.  No pain with calf compression, swelling, warmth, erythema  Assessment: Ongoing ulceration medial right foot, which has improved.   Plan: -All treatment options discussed with the patient including all alternatives, risks, complications.  -MRI results were discussed the patient. See below. At this time the wound has improved so therefore a likely will hold off on surgical intervention for debridement. If the wound continues to heal the worsens discussed operative debridement and biopsy. Continued follow-up with Dr. Con Memos on the wound care center. Watch for signs or symptoms of infection. Follow-up with me in about 4 weeks. Prior to the  wound started she has been seen by sports medicine for tendinitis. Should symptoms persist we'll discuss with her treatment for this. -Follow-up in 4 weeks or sooner if any problems arise. In the meantime, encouraged to call the office with any questions, concerns, change in symptoms.   MRI IMPRESSION: 1. No osteomyelitis of the right forefoot. 2. Flexor digitorum longus tenosynovitis.  Celesta Gentile, DPM

## 2016-03-10 NOTE — Assessment & Plan Note (Signed)
I've re-filled her pravastatin 40mg  daily. We can check a lipid panel at her next visit.

## 2016-03-10 NOTE — Assessment & Plan Note (Signed)
Her a1c was 9.9 two weeks ago on metformin 1000mg  daily and glipizide 5mg  twice daily. She was prescribed sitagliptin-metformin combination pill but this cost $230 so she couldn't afford it. Her diet has gotten much worse because of the stress of her foot ulcer; fortunately this is healing well, her pain is improving, and osteomyelitis was recently ruled out with a normal MRI.  Today I've added sitagliptin 100mg  daily, recommended she try taking metformin 1000mg  twice daily, asked her to resume glipizide 5mg  twice daily, and encouraged her to reach out to [facility] for nutritional counseling. The sitagliptin should be more affordable since it is not in a combination pill; if it's still too expensive, I've asked her to call our clinic and we can help her out.

## 2016-03-10 NOTE — Progress Notes (Signed)
Internal Medicine Clinic Attending  Case discussed with Dr. Flores at the time of the visit.  We reviewed the resident's history and exam and pertinent patient test results.  I agree with the assessment, diagnosis, and plan of care documented in the resident's note. 

## 2016-03-10 NOTE — Progress Notes (Signed)
[name], female   DOB: 27-Feb-1971, 45 y.o.   MRN: [id number] [facility] Subjective:   [name] female   DOB: 02-Nov-1971 45 y.o.   MRN: [id number]  HPI: [name] is a 45 y.o. female with type 2 diabetes and hypertension here for follow-up of type 2 diabetes.  Type 2 diabetes: She is only taking metformin 1000mg  once daily and glipizide 5mg  twice daily. She couldn't afford the sitaligpitin-metformin combination pill. Her last a1c was 9.9 two weeks ago. She has not been checking her sugars but denies any hypoglycemic symptoms.  She is smoking 1/2 pack of cigarettes daily and I have reviewed her medications with her today.  Review of Systems  Constitutional: Negative for fever, chills and weight loss.  Respiratory: Negative for shortness of breath.   Cardiovascular: Negative for chest pain and leg swelling.  Neurological: Negative for headaches.   Objective:  Physical Exam: Filed Vitals:   03/10/16 0829  BP: 137/87  Pulse: 87  Temp: 97.9 F (36.6 C)  TempSrc: Oral  Weight: 293 lb 1.6 oz (132.949 kg)  SpO2: 100%    General: resting in chair comfortably, appropriately conversational HEENT: no scleral icterus, extra-ocular muscles intact, oropharynx without lesions Cardiac: regular rate and rhythm, no rubs, murmurs or gallops Pulm: breathing well, clear to auscultation bilaterally  Assessment & Plan:  Case discussed with Dr. Dareen Piano  Diabetes mellitus type 2, controlled Her a1c was 9.9 two weeks ago on metformin 1000mg  daily and glipizide 5mg  twice daily. She was prescribed sitagliptin-metformin combination pill but this cost $230 so she couldn't afford it. Her diet has gotten much worse because of the stress of her foot ulcer; fortunately this is healing well, her pain is improving, and osteomyelitis was recently ruled out with a normal MRI.  Today I've added sitagliptin 100mg  daily, recommended  she try taking metformin 1000mg  twice daily, asked her to resume glipizide 5mg  twice daily, and encouraged her to reach out to [facility] for nutritional counseling. The sitagliptin should be more affordable since it is not in a combination pill; if it's still too expensive, I've asked her to call our clinic and we can help her out.  Dyslipidemia I've re-filled her pravastatin 40mg  daily. We can check a lipid panel at her next visit.   Medications Ordered Meds ordered this encounter  Medications  . metFORMIN (GLUCOPHAGE) 1000 MG tablet    Sig: Take 1 tablet (1,000 mg total) by mouth daily with breakfast.    Dispense:  30 tablet    Refill:  11  . pravastatin (PRAVACHOL) 40 MG tablet    Sig: Take 1 tablet (40 mg total) by mouth every evening.    Dispense:  90 tablet    Refill:  3  . sitaGLIPtin (JANUVIA) 100 MG tablet    Sig: Take 1 tablet (100 mg total) by mouth daily.    Dispense:  90 tablet    Refill:  3    Follow Up: Return in about 3 months (around 06/10/2016).

## 2016-03-14 ENCOUNTER — Other Ambulatory Visit: Payer: Self-pay | Admitting: Internal Medicine

## 2016-03-14 NOTE — Telephone Encounter (Signed)
What is our next step, patient cannot afford Januvia.

## 2016-03-14 NOTE — Telephone Encounter (Signed)
Pt can't afford $94.00  for the Januvia RX. Pt requests help with this or to change RX's.

## 2016-03-14 NOTE — Telephone Encounter (Signed)
Called [facility]: Januvia her cost is  $94.83,insurance paid $280.00-   Per her formulary- januvia and janumet formulations are a tier 2-  Jardiance is tier 3, pioglitizone is tier 1 Most insulins including Novolog mix 70/30, Novolin N,  lantus vail, pens and levemir are also tier 2s. If insulin is needed we have copapy cards for some she can use to bring down the cost.

## 2016-03-16 NOTE — Telephone Encounter (Signed)
Would any of the GLP1's be covered under her insurance?  I think this or either an SGLT2 would be a good option for her.  Thanks.

## 2016-03-17 NOTE — Telephone Encounter (Signed)
Hi Dr. Gordy Levan, The SGLT-2 and GLP-1 agents are tier 3 or above similar to Januvia. Pioglitazone is Tier 1. I wonder if we should increase the glipizide dose first. Also, patient stated she is willing to try again with metformin 2000/day since she wasn't taking it with a full meal in the past. Please let me know anything I can do to help.  Thank you

## 2016-03-26 ENCOUNTER — Encounter: Payer: Self-pay | Admitting: Internal Medicine

## 2016-03-26 ENCOUNTER — Ambulatory Visit (INDEPENDENT_AMBULATORY_CARE_PROVIDER_SITE_OTHER): Payer: BLUE CROSS/BLUE SHIELD | Admitting: Internal Medicine

## 2016-03-26 VITALS — BP 143/59 | HR 91 | Temp 98.2°F | Wt 287.6 lb

## 2016-03-26 DIAGNOSIS — Z720 Tobacco use: Secondary | ICD-10-CM

## 2016-03-26 DIAGNOSIS — N83209 Unspecified ovarian cyst, unspecified side: Secondary | ICD-10-CM | POA: Diagnosis not present

## 2016-03-26 DIAGNOSIS — E119 Type 2 diabetes mellitus without complications: Secondary | ICD-10-CM | POA: Diagnosis not present

## 2016-03-26 DIAGNOSIS — Z8742 Personal history of other diseases of the female genital tract: Secondary | ICD-10-CM | POA: Diagnosis not present

## 2016-03-26 DIAGNOSIS — M5116 Intervertebral disc disorders with radiculopathy, lumbar region: Secondary | ICD-10-CM

## 2016-03-26 DIAGNOSIS — I10 Essential (primary) hypertension: Secondary | ICD-10-CM

## 2016-03-26 DIAGNOSIS — F17210 Nicotine dependence, cigarettes, uncomplicated: Secondary | ICD-10-CM

## 2016-03-26 LAB — POCT URINALYSIS DIPSTICK
BILIRUBIN UA: NEGATIVE
LEUKOCYTES UA: NEGATIVE
NITRITE UA: NEGATIVE
PH UA: 5.5
PROTEIN UA: NEGATIVE
Spec Grav, UA: 1.01
Urobilinogen, UA: 0.2

## 2016-03-26 MED ORDER — KETOROLAC TROMETHAMINE 30 MG/ML IJ SOLN
60.0000 mg | Freq: Once | INTRAMUSCULAR | Status: AC
  Administered 2016-03-26: 60 mg via INTRAMUSCULAR

## 2016-03-26 MED ORDER — CYCLOBENZAPRINE HCL 5 MG PO TABS: 5.0000 mg | ORAL_TABLET | Freq: Three times a day (TID) | ORAL | Status: AC | PRN

## 2016-03-26 MED ORDER — IBUPROFEN 600 MG PO TABS: 600.0000 mg | ORAL_TABLET | Freq: Four times a day (QID) | ORAL | Status: DC | PRN

## 2016-03-26 MED ORDER — NAPROXEN 500 MG PO TABS: 500.0000 mg | ORAL_TABLET | Freq: Two times a day (BID) | ORAL | Status: DC | PRN

## 2016-03-26 MED ORDER — KETOROLAC TROMETHAMINE 30 MG/ML IM SOLN: 30.0000 mg | Freq: Once | INTRAMUSCULAR | Status: DC

## 2016-03-26 NOTE — Progress Notes (Signed)
Case discussed with Dr. Gill soon after the resident saw the patient.  We reviewed the resident's history and exam and pertinent patient test results.  I agree with the assessment, diagnosis, and plan of care documented in the resident's note. 

## 2016-03-26 NOTE — Assessment & Plan Note (Signed)
Pt requesting referral for gynecology for ovarian cyst and uterine fibroids previously. -referral to gynecology

## 2016-03-26 NOTE — Assessment & Plan Note (Signed)
Unfortunately, she is still smoking.  We discussed how this will delay her wound healing.  She has the patches but has not used them. -encouraged to quit smoking and use the patches

## 2016-03-26 NOTE — Patient Instructions (Signed)
Thank you for your visit today.   Please return to the internal medicine clinic in about about 8 weeks or sooner if needed.     I have made the following additions/changes to your medications:  I have refilled flexeril, please follow directions on the bottle. I have also refilled naprosyn, please follow directions on bottle. Please follow up with Dr. Awanda Mink. Also, we will make you a referral to gynecology.  I will check your BMP today and will only call if results are abnormal.   Please be sure to bring all of your medications with you to every visit; this includes herbal supplements, vitamins, eye drops, and any over-the-counter medications.   Should you have any questions regarding your medications and/or any new or worsening symptoms, please be sure to call the clinic at [phone].   If you believe that you are suffering from a life threatening condition or one that may result in the loss of limb or function, then you should call 911 and proceed to the nearest Emergency Department.   A healthy lifestyle and preventative care can promote health and wellness.   Maintain regular health, dental, and eye exams.  Eat a healthy diet. Foods like vegetables, fruits, whole grains, low-fat dairy products, and lean protein foods contain the nutrients you need without too many calories. Decrease your intake of foods high in solid fats, added sugars, and salt. Get information about a proper diet from your caregiver, if necessary.  Regular physical exercise is one of the most important things you can do for your health. Most adults should get at least 150 minutes of moderate-intensity exercise (any activity that increases your heart rate and causes you to sweat) each week. In addition, most adults need muscle-strengthening exercises on 2 or more days a week.   Maintain a healthy weight. The body mass index (BMI) is a screening tool to identify possible weight problems. It provides an estimate of  body fat based on height and weight. Your caregiver can help determine your BMI, and can help you achieve or maintain a healthy weight. For adults 20 years and older:  A BMI below 18.5 is considered underweight.  A BMI of 18.5 to 24.9 is normal.  A BMI of 25 to 29.9 is considered overweight.  A BMI of 30 and above is considered obese.

## 2016-03-26 NOTE — Assessment & Plan Note (Addendum)
Pt presents with worsening lower back pain.  No red flags.  She is taking vicodin and percocet but states this is not helping.  She has seen Dr. Awanda Mink in January for this but did not follow up.  She is also on her menstrual cycle which may be exacerbating her back pain.  She also has a h/o uterine fibroids which may also be worsening her pain. XR lumbar spine 2016 with DDD at L3-4 and L5-S1 with disc space narrowing and spurring. Vacuum disc at L5-S1. -60mg  IM toradol today -flexeril PRN -refill ibuprofen (states naprosyn doesn't work for her)  -has f/u with Dr. Awanda Mink this Friday -referral to gynecology

## 2016-03-26 NOTE — Assessment & Plan Note (Addendum)
BP today slightly elevated, likely d/t pain. -cont current meds -check BMP
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
## 2016-03-26 NOTE — Addendum Note (Signed)
Addended by: Marcelino Duster on: 03/26/2016 11:34 AM   Modules accepted: Orders

## 2016-03-26 NOTE — Assessment & Plan Note (Signed)
She is not due for an HA1c today. -cont current meds -f/u after July 25th for repeat HA1c

## 2016-03-27 ENCOUNTER — Emergency Department (HOSPITAL_BASED_OUTPATIENT_CLINIC_OR_DEPARTMENT_OTHER): Payer: BLUE CROSS/BLUE SHIELD

## 2016-03-27 ENCOUNTER — Encounter (HOSPITAL_BASED_OUTPATIENT_CLINIC_OR_DEPARTMENT_OTHER): Payer: Self-pay | Admitting: *Deleted

## 2016-03-27 ENCOUNTER — Emergency Department (HOSPITAL_BASED_OUTPATIENT_CLINIC_OR_DEPARTMENT_OTHER)
Admission: EM | Admit: 2016-03-27 | Discharge: 2016-03-27 | Disposition: A | Discharge status: Home / Self Care | Payer: BLUE CROSS/BLUE SHIELD | Attending: Emergency Medicine | Admitting: Emergency Medicine

## 2016-03-27 DIAGNOSIS — Z7984 Long term (current) use of oral hypoglycemic drugs: Secondary | ICD-10-CM | POA: Insufficient documentation

## 2016-03-27 DIAGNOSIS — M545 Low back pain, unspecified: Secondary | ICD-10-CM

## 2016-03-27 DIAGNOSIS — Z79899 Other long term (current) drug therapy: Secondary | ICD-10-CM | POA: Insufficient documentation

## 2016-03-27 DIAGNOSIS — E785 Hyperlipidemia, unspecified: Secondary | ICD-10-CM | POA: Insufficient documentation

## 2016-03-27 DIAGNOSIS — Z6837 Body mass index (BMI) 37.0-37.9, adult: Secondary | ICD-10-CM | POA: Insufficient documentation

## 2016-03-27 DIAGNOSIS — E669 Obesity, unspecified: Secondary | ICD-10-CM | POA: Insufficient documentation

## 2016-03-27 DIAGNOSIS — R109 Unspecified abdominal pain: Secondary | ICD-10-CM | POA: Diagnosis not present

## 2016-03-27 DIAGNOSIS — F17210 Nicotine dependence, cigarettes, uncomplicated: Secondary | ICD-10-CM | POA: Insufficient documentation

## 2016-03-27 DIAGNOSIS — E119 Type 2 diabetes mellitus without complications: Secondary | ICD-10-CM | POA: Diagnosis not present

## 2016-03-27 DIAGNOSIS — I10 Essential (primary) hypertension: Secondary | ICD-10-CM | POA: Diagnosis not present

## 2016-03-27 LAB — BMP8+ANION GAP
Anion Gap: 22 mmol/L — ABNORMAL HIGH (ref 10.0–18.0)
BUN / CREAT RATIO: 10 (ref 9–23)
BUN: 7 mg/dL (ref 6–24)
CO2: 21 mmol/L (ref 18–29)
CREATININE: 0.73 mg/dL (ref 0.57–1.00)
Calcium: 10 mg/dL (ref 8.7–10.2)
Chloride: 92 mmol/L — ABNORMAL LOW (ref 96–106)
GFR calc Af Amer: 114 mL/min/{1.73_m2} (ref >59)
GFR calc non Af Amer: 99 mL/min/{1.73_m2} (ref >59)
Glucose: 403 mg/dL — ABNORMAL HIGH (ref 65–99)
Potassium: 4.5 mmol/L (ref 3.5–5.2)
SODIUM: 135 mmol/L (ref 134–144)

## 2016-03-27 LAB — URINALYSIS, ROUTINE W REFLEX MICROSCOPIC
Bilirubin Urine: NEGATIVE
Ketones, ur: 15 mg/dL — AB
Leukocytes, UA: NEGATIVE
Nitrite: NEGATIVE
PH: 6 (ref 5.0–8.0)
Protein, ur: NEGATIVE mg/dL
SPECIFIC GRAVITY, URINE: 1.041 — AB (ref 1.005–1.030)

## 2016-03-27 LAB — CBC WITH DIFFERENTIAL/PLATELET
Basophils Absolute: 0 10*3/uL (ref 0.0–0.1)
Basophils Relative: 0 %
EOS PCT: 1 %
Eosinophils Absolute: 0.1 10*3/uL (ref 0.0–0.7)
HEMATOCRIT: 38 % (ref 36.0–46.0)
Hemoglobin: 12.5 g/dL (ref 12.0–15.0)
LYMPHS ABS: 2.3 10*3/uL (ref 0.7–4.0)
LYMPHS PCT: 28 %
MCH: 27.1 pg (ref 26.0–34.0)
MCHC: 32.9 g/dL (ref 30.0–36.0)
MCV: 82.4 fL (ref 78.0–100.0)
MONO ABS: 0.6 10*3/uL (ref 0.1–1.0)
MONOS PCT: 7 %
Neutro Abs: 5.2 10*3/uL (ref 1.7–7.7)
Neutrophils Relative %: 64 %
PLATELETS: 385 10*3/uL (ref 150–400)
RBC: 4.61 MIL/uL (ref 3.87–5.11)
RDW: 13.5 % (ref 11.5–15.5)
WBC: 8.2 10*3/uL (ref 4.0–10.5)

## 2016-03-27 LAB — PREGNANCY, URINE: Preg Test, Ur: NEGATIVE

## 2016-03-27 LAB — BASIC METABOLIC PANEL
Anion gap: 11 (ref 5–15)
BUN: 8 mg/dL (ref 6–20)
CALCIUM: 9.1 mg/dL (ref 8.9–10.3)
CO2: 23 mmol/L (ref 22–32)
Chloride: 99 mmol/L — ABNORMAL LOW (ref 101–111)
Creatinine, Ser: 0.66 mg/dL (ref 0.44–1.00)
GFR calc Af Amer: >60 mL/min (ref >60)
GLUCOSE: 344 mg/dL — AB (ref 65–99)
Potassium: 3.9 mmol/L (ref 3.5–5.1)
Sodium: 133 mmol/L — ABNORMAL LOW (ref 135–145)

## 2016-03-27 LAB — URINE MICROSCOPIC-ADD ON

## 2016-03-27 LAB — CBG MONITORING, ED: Glucose-Capillary: 311 mg/dL — ABNORMAL HIGH (ref 65–99)

## 2016-03-27 MED ORDER — KETOROLAC TROMETHAMINE 60 MG/2ML IM SOLN
60.0000 mg | Freq: Once | INTRAMUSCULAR | Status: AC
  Administered 2016-03-27: 60 mg via INTRAMUSCULAR
  Filled 2016-03-27: qty 2

## 2016-03-27 MED ORDER — OXYCODONE-ACETAMINOPHEN 5-325 MG PO TABS
2.0000 | ORAL_TABLET | Freq: Once | ORAL | Status: AC
  Administered 2016-03-27: 2 via ORAL
  Filled 2016-03-27: qty 2

## 2016-03-27 MED ORDER — NAPROXEN 500 MG PO TABS: 500.0000 mg | ORAL_TABLET | Freq: Two times a day (BID) | ORAL | Status: DC

## 2016-03-27 MED ORDER — OXYCODONE-ACETAMINOPHEN 5-325 MG PO TABS: 1.0000 | ORAL_TABLET | ORAL | Status: DC | PRN

## 2016-03-27 MED ORDER — METHOCARBAMOL 500 MG PO TABS
1000.0000 mg | ORAL_TABLET | Freq: Once | ORAL | Status: AC
  Administered 2016-03-27: 1000 mg via ORAL
  Filled 2016-03-27: qty 2

## 2016-03-27 MED ORDER — METHOCARBAMOL 500 MG PO TABS: 500.0000 mg | ORAL_TABLET | Freq: Two times a day (BID) | ORAL | Status: DC

## 2016-03-27 MED ORDER — LIDOCAINE 5 % EX PTCH: 1.0000 | MEDICATED_PATCH | CUTANEOUS | Status: DC

## 2016-03-27 MED FILL — METHOCARBAMOL 500 MG TABLET: 500 | 10 days supply | Qty: 20 | Fill #0

## 2016-03-27 MED FILL — OXYCODONE/APAP 5-325: 5-325 | 2 days supply | Qty: 8 | Fill #0

## 2016-03-27 MED FILL — NAPROXEN 500 MG TABLET: 500 | 15 days supply | Qty: 30 | Fill #0

## 2016-03-27 NOTE — ED Notes (Signed)
Pt reports left flank/low back pain x Friday. Denies any fevers, pt states "it always hurts with my cycle, but this is terrible!" denies any dysuria, hematuria, ua sent.

## 2016-03-27 NOTE — Discharge Instructions (Signed)
You have been seen today for back pain. Your imaging and lab tests showed no acute abnormalities. Take it easy, but do not lay around too much as this may make the stiffness worse. Take 500 mg of naproxen every 12 hours or 800 mg of ibuprofen every 8 hours for the next 3 days. Take these medications with food to avoid upset stomach. Robaxin is a muscle relaxer and may help loosen stiff muscles. Percocet for severe pain. Do not take the Robaxin or Percocet while driving or performing other dangerous activities. Follow up with PCP as needed should symptoms continue. Return to ED should symptoms worsen.
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
## 2016-03-27 NOTE — ED Notes (Signed)
Pt reports that she feels as if her blood sugar is low, cbg checked, 311 cbg at this time, PA notified.

## 2016-03-28 ENCOUNTER — Ambulatory Visit: Payer: BLUE CROSS/BLUE SHIELD | Admitting: Family Medicine

## 2016-03-29 LAB — URINE CULTURE

## 2016-04-01 DIAGNOSIS — E11621 Type 2 diabetes mellitus with foot ulcer: Secondary | ICD-10-CM | POA: Diagnosis not present

## 2016-04-03 DIAGNOSIS — L97519 Non-pressure chronic ulcer of other part of right foot with unspecified severity: Secondary | ICD-10-CM

## 2016-04-11 ENCOUNTER — Telehealth: Payer: Self-pay | Admitting: *Deleted

## 2016-04-11 ENCOUNTER — Encounter: Payer: Self-pay | Admitting: Podiatry

## 2016-04-11 ENCOUNTER — Encounter: Payer: Self-pay | Admitting: *Deleted

## 2016-04-11 ENCOUNTER — Ambulatory Visit (INDEPENDENT_AMBULATORY_CARE_PROVIDER_SITE_OTHER): Payer: BLUE CROSS/BLUE SHIELD | Admitting: Podiatry

## 2016-04-11 DIAGNOSIS — M779 Enthesopathy, unspecified: Secondary | ICD-10-CM | POA: Diagnosis not present

## 2016-04-11 DIAGNOSIS — L97519 Non-pressure chronic ulcer of other part of right foot with unspecified severity: Secondary | ICD-10-CM | POA: Diagnosis not present

## 2016-04-11 NOTE — Telephone Encounter (Signed)
Pt asked for different boot, her employer will not allow her to wear there at work.  Offered pt a hard toe cap that fits in the boot, but no other work/surgical boots.  Pt states that won't work, will contact employer Monday and discuss FLMA and call Janett Billow.

## 2016-04-11 NOTE — Progress Notes (Signed)
[name], female   DOB: 24-Mar-1971, 45 y.o.   MRN: [id number]  Subjective: 45 year-old female presents the office today for follow up evaluation of a wound to the side of her right foot. She is continued follow-up of the wound care center as well. The wound has improved however it is still present. She has tried wearing a regular shoe but unable to do so. Denies any drainage or pus. The swelling has improved however does continue. The area is still painful with pressure in shoe gear still although improved. Denies any systemic complaints such as fevers, chills, nausea, vomiting. No acute changes since last appointment, and no other complaints at this time.   Objective: AAO x3, NAD DP/PT pulses palpable bilaterally, CRT less than 3 seconds Protective sensation intact with Simms Weinstein monofilament Wound present on medial aspect of the left foot at approximately the talonavicular joint medially. Today the wound is about half the size of what was previously. There is no drainage or pus. There is no probing, undermining or tunneling. There is no swelling erythema, ascending cellulitis, fluctuance, crepitus, malodor. No  tenderness along the course of the flexor tendons. No edema, erythema, increase in warmth to bilateral lower extremities.  No open lesions or pre-ulcerative lesions.  No pain with calf compression, swelling, warmth, erythema  Assessment: Ongoing ulceration medial right foot, which has improved.   Plan: -All treatment options discussed with the patient including all alternatives, risks, complications.  -she says pain with a regular shoe. Given the chronic wound as well as the tenosynovitis (although I think asymptomtic) will place in short CAM boot today to help with the pain and swelling and offload the wound hopefully.  -Continue daily dressing changes with medihoney per wound care. -Monitor for any clinical signs or symptoms of infection and directed to  call the office immediately should any occur or go to the ER. -Given that she has to wear shoe at work she cannot do this therefore we'll extend disability for 2 weeks. -Follow-up with me as scheduled or sooner if any issues are to arise.  Celesta Gentile, DPM

## 2016-04-14 DIAGNOSIS — L97519 Non-pressure chronic ulcer of other part of right foot with unspecified severity: Secondary | ICD-10-CM

## 2016-04-16 ENCOUNTER — Other Ambulatory Visit: Payer: Self-pay | Admitting: Internal Medicine

## 2016-04-16 ENCOUNTER — Encounter (HOSPITAL_BASED_OUTPATIENT_CLINIC_OR_DEPARTMENT_OTHER): Payer: BLUE CROSS/BLUE SHIELD

## 2016-04-16 DIAGNOSIS — D259 Leiomyoma of uterus, unspecified: Secondary | ICD-10-CM

## 2016-04-16 DIAGNOSIS — N83209 Unspecified ovarian cyst, unspecified side: Secondary | ICD-10-CM

## 2016-04-17 ENCOUNTER — Other Ambulatory Visit: Payer: Self-pay | Admitting: Internal Medicine

## 2016-04-17 DIAGNOSIS — D259 Leiomyoma of uterus, unspecified: Secondary | ICD-10-CM

## 2016-04-23 ENCOUNTER — Encounter (HOSPITAL_BASED_OUTPATIENT_CLINIC_OR_DEPARTMENT_OTHER): Payer: BLUE CROSS/BLUE SHIELD | Attending: Surgery

## 2016-04-23 DIAGNOSIS — I10 Essential (primary) hypertension: Secondary | ICD-10-CM | POA: Diagnosis not present

## 2016-04-23 DIAGNOSIS — Z6835 Body mass index (BMI) 35.0-35.9, adult: Secondary | ICD-10-CM | POA: Insufficient documentation

## 2016-04-23 DIAGNOSIS — Z7984 Long term (current) use of oral hypoglycemic drugs: Secondary | ICD-10-CM | POA: Insufficient documentation

## 2016-04-23 DIAGNOSIS — F17210 Nicotine dependence, cigarettes, uncomplicated: Secondary | ICD-10-CM | POA: Insufficient documentation

## 2016-04-23 DIAGNOSIS — M659 Synovitis and tenosynovitis, unspecified: Secondary | ICD-10-CM | POA: Diagnosis not present

## 2016-04-23 DIAGNOSIS — E669 Obesity, unspecified: Secondary | ICD-10-CM | POA: Diagnosis not present

## 2016-04-23 DIAGNOSIS — E785 Hyperlipidemia, unspecified: Secondary | ICD-10-CM | POA: Insufficient documentation

## 2016-04-23 DIAGNOSIS — Z79899 Other long term (current) drug therapy: Secondary | ICD-10-CM | POA: Insufficient documentation

## 2016-04-23 DIAGNOSIS — E11621 Type 2 diabetes mellitus with foot ulcer: Secondary | ICD-10-CM | POA: Insufficient documentation

## 2016-04-23 DIAGNOSIS — L97512 Non-pressure chronic ulcer of other part of right foot with fat layer exposed: Secondary | ICD-10-CM | POA: Diagnosis not present

## 2016-04-23 DIAGNOSIS — D649 Anemia, unspecified: Secondary | ICD-10-CM | POA: Insufficient documentation

## 2016-04-25 ENCOUNTER — Ambulatory Visit (HOSPITAL_COMMUNITY): Payer: BLUE CROSS/BLUE SHIELD

## 2016-04-27 ENCOUNTER — Other Ambulatory Visit: Payer: Self-pay | Admitting: Internal Medicine

## 2016-05-01 ENCOUNTER — Ambulatory Visit (HOSPITAL_COMMUNITY)
Admission: RE | Admit: 2016-05-01 | Discharge: 2016-05-01 | Disposition: A | Discharge status: Home / Self Care | Payer: BLUE CROSS/BLUE SHIELD | Source: Ambulatory Visit | Attending: Internal Medicine | Admitting: Internal Medicine

## 2016-05-01 DIAGNOSIS — N83209 Unspecified ovarian cyst, unspecified side: Secondary | ICD-10-CM

## 2016-05-01 DIAGNOSIS — M545 Low back pain: Secondary | ICD-10-CM | POA: Insufficient documentation

## 2016-05-01 DIAGNOSIS — D259 Leiomyoma of uterus, unspecified: Secondary | ICD-10-CM | POA: Insufficient documentation

## 2016-05-01 DIAGNOSIS — N83201 Unspecified ovarian cyst, right side: Secondary | ICD-10-CM | POA: Diagnosis not present

## 2016-05-02 ENCOUNTER — Ambulatory Visit (INDEPENDENT_AMBULATORY_CARE_PROVIDER_SITE_OTHER): Payer: BLUE CROSS/BLUE SHIELD | Admitting: Podiatry

## 2016-05-02 ENCOUNTER — Encounter: Payer: Self-pay | Admitting: Podiatry

## 2016-05-02 VITALS — BP 118/85 | HR 96 | Resp 16

## 2016-05-02 DIAGNOSIS — L97519 Non-pressure chronic ulcer of other part of right foot with unspecified severity: Secondary | ICD-10-CM | POA: Diagnosis not present

## 2016-05-02 MED ORDER — AMOXICILLIN-POT CLAVULANATE 875-125 MG PO TABS: 1.0000 | ORAL_TABLET | Freq: Two times a day (BID) | ORAL | Status: DC

## 2016-05-02 MED ORDER — IBUPROFEN 600 MG PO TABS: 600.0000 mg | ORAL_TABLET | Freq: Three times a day (TID) | ORAL | Status: DC | PRN

## 2016-05-03 NOTE — Progress Notes (Signed)
[name], female   DOB: 1971-06-06, 45 y.o.   MRN: [id number]  Subjective: 45 year-old female presents the office today for follow up evaluation of a wound to the side of her right foot. He is continued care the wound care center as well. Since the area is still painful somewhat swollen around the wound and she is unable to wear regular shoe because of this. Denies any redness or warmth or any red streaks. Remained out of work because of this. Denies any systemic complaints such as fevers, chills, nausea, vomiting. No acute changes since last appointment, and no other complaints at this time.   Objective: AAO x3, NAD DP/PT pulses palpable bilaterally, CRT less than 3 seconds Protective sensation intact with Simms Weinstein monofilament Wound present on medial aspect of the left foot at approximately the talonavicular joint medially. There is evidence of wound healing the wound appears be superficial but is not quite healed yet. It measures approximately 0.6 x 0.5 cm in a superficial any probing, undermining or tunneling. There is mild localized edema around the area of the any erythema or increase in warmth. There is no fluctuance or crepitus. There is no malodor. There is no drainage. No open lesions or pre-ulcerative lesions.  No pain with calf compression, swelling, warmth, erythema  Assessment: Ongoing ulceration medial right foot  Plan: -All treatment options discussed with the patient including all alternatives, risks, complications.  -At this time given the swelling on the wound will start Augmentin. Monitor for signs or symptoms of other infection. -Continue daily dressing changes for the wound care center. -Continue offloading. -She states that she has to be determined at work today. I discussed that if she can lose her job and she needs to work to pad the area very well and her shoes. If she is unable to do this than to stay out of work until able to wear regular  shoe. -I will see her back in 2 weeks. If the wound does continue discussed that once the operative room for wound excision/biopsy  Celesta Gentile, DPM   -she says pain with a regular shoe. Given the chronic wound as well as the tenosynovitis (although I think asymptomtic) will place in short CAM boot today to help with the pain and swelling and offload the wound hopefully.  -Continue daily dressing changes with medihoney per wound care. -Monitor for any clinical signs or symptoms of infection and directed to call the office immediately should any occur or go to the ER. -Given that she has to wear shoe at work she cannot do this therefore we'll extend disability for 2 weeks. -Follow-up with me as scheduled or sooner if any issues are to arise.  Celesta Gentile, DPM

## 2016-05-07 ENCOUNTER — Encounter (HOSPITAL_BASED_OUTPATIENT_CLINIC_OR_DEPARTMENT_OTHER): Payer: BLUE CROSS/BLUE SHIELD

## 2016-05-09 ENCOUNTER — Encounter: Payer: BLUE CROSS/BLUE SHIELD | Admitting: Internal Medicine

## 2016-05-09 ENCOUNTER — Encounter: Payer: Self-pay | Admitting: Internal Medicine

## 2016-05-19 ENCOUNTER — Ambulatory Visit: Payer: BLUE CROSS/BLUE SHIELD | Admitting: Podiatry

## 2016-05-23 ENCOUNTER — Other Ambulatory Visit: Payer: Self-pay | Admitting: *Deleted

## 2016-05-23 DIAGNOSIS — I10 Essential (primary) hypertension: Secondary | ICD-10-CM

## 2016-05-23 MED ORDER — GLIPIZIDE 5 MG PO TABS: 5.0000 mg | ORAL_TABLET | Freq: Two times a day (BID) | ORAL | Status: DC

## 2016-05-23 MED ORDER — ENALAPRIL MALEATE 10 MG PO TABS: 10.0000 mg | ORAL_TABLET | Freq: Every day | ORAL | Status: DC

## 2016-06-09 ENCOUNTER — Ambulatory Visit: Payer: BLUE CROSS/BLUE SHIELD | Admitting: Podiatry

## 2016-06-12 ENCOUNTER — Ambulatory Visit (INDEPENDENT_AMBULATORY_CARE_PROVIDER_SITE_OTHER): Payer: BLUE CROSS/BLUE SHIELD | Admitting: Obstetrics & Gynecology

## 2016-06-12 ENCOUNTER — Encounter: Payer: Self-pay | Admitting: Obstetrics & Gynecology

## 2016-06-12 VITALS — BP 142/71 | HR 91 | Ht 76.0 in | Wt 270.7 lb

## 2016-06-12 DIAGNOSIS — N920 Excessive and frequent menstruation with regular cycle: Secondary | ICD-10-CM | POA: Diagnosis not present

## 2016-06-12 DIAGNOSIS — N946 Dysmenorrhea, unspecified: Secondary | ICD-10-CM | POA: Diagnosis not present

## 2016-06-12 LAB — POCT PREGNANCY, URINE: PREG TEST UR: NEGATIVE

## 2016-06-12 MED ORDER — MEDROXYPROGESTERONE ACETATE 150 MG/ML IM SUSP
150.0000 mg | Freq: Once | INTRAMUSCULAR | Status: AC
Start: 2016-06-12 — End: 2016-06-12
  Administered 2016-06-12: 150 mg via INTRAMUSCULAR

## 2016-06-12 NOTE — Progress Notes (Signed)
   Subjective:    [name], female    DOB: 03-Feb-1971, 45 y.o.   MRN: [id number]  HPI 45 yo S AA P0 here today with the issue of being diagnosed with a small ovarian cyst. It was simple and small. Her main problem is that of heavy painful periods for decades. She bleeds for 7-8 days.  She has never needed contraception as she has only had same sex relationship. Her hbg is 12.5. She had anemia in the past.   Review of Systems     Objective:   Physical Exam  [id number] Breathing, conversing, and ambulating normally Abd- benign      Assessment & Plan:  Preventative care- RTC when she is not on her period for pap smear, schedule mammogram Menorrhagia, dysmenorrhea- fibroids- offered OCPs, depo provera, depo lupron and TAH/BS. She would like to try depo provera

## 2016-06-16 ENCOUNTER — Institutional Professional Consult (permissible substitution): Payer: BLUE CROSS/BLUE SHIELD

## 2016-06-18 DIAGNOSIS — E785 Hyperlipidemia, unspecified: Secondary | ICD-10-CM | POA: Diagnosis not present

## 2016-06-18 DIAGNOSIS — E11621 Type 2 diabetes mellitus with foot ulcer: Secondary | ICD-10-CM | POA: Insufficient documentation

## 2016-06-18 DIAGNOSIS — I10 Essential (primary) hypertension: Secondary | ICD-10-CM | POA: Insufficient documentation

## 2016-06-18 DIAGNOSIS — F17210 Nicotine dependence, cigarettes, uncomplicated: Secondary | ICD-10-CM | POA: Insufficient documentation

## 2016-06-18 DIAGNOSIS — E669 Obesity, unspecified: Secondary | ICD-10-CM | POA: Insufficient documentation

## 2016-06-18 DIAGNOSIS — Z6835 Body mass index (BMI) 35.0-35.9, adult: Secondary | ICD-10-CM | POA: Insufficient documentation

## 2016-06-18 DIAGNOSIS — L97519 Non-pressure chronic ulcer of other part of right foot with unspecified severity: Secondary | ICD-10-CM | POA: Insufficient documentation

## 2016-06-18 DIAGNOSIS — D509 Iron deficiency anemia, unspecified: Secondary | ICD-10-CM | POA: Insufficient documentation

## 2016-07-04 ENCOUNTER — Emergency Department (HOSPITAL_BASED_OUTPATIENT_CLINIC_OR_DEPARTMENT_OTHER)
Admission: EM | Admit: 2016-07-04 | Discharge: 2016-07-04 | Disposition: A | Discharge status: Home / Self Care | Payer: BLUE CROSS/BLUE SHIELD | Attending: Emergency Medicine | Admitting: Emergency Medicine

## 2016-07-04 ENCOUNTER — Encounter (HOSPITAL_BASED_OUTPATIENT_CLINIC_OR_DEPARTMENT_OTHER): Payer: Self-pay

## 2016-07-04 ENCOUNTER — Emergency Department (HOSPITAL_BASED_OUTPATIENT_CLINIC_OR_DEPARTMENT_OTHER): Payer: BLUE CROSS/BLUE SHIELD

## 2016-07-04 DIAGNOSIS — F17210 Nicotine dependence, cigarettes, uncomplicated: Secondary | ICD-10-CM | POA: Diagnosis not present

## 2016-07-04 DIAGNOSIS — I10 Essential (primary) hypertension: Secondary | ICD-10-CM | POA: Diagnosis not present

## 2016-07-04 DIAGNOSIS — W1809XA Striking against other object with subsequent fall, initial encounter: Secondary | ICD-10-CM | POA: Diagnosis not present

## 2016-07-04 DIAGNOSIS — Z7984 Long term (current) use of oral hypoglycemic drugs: Secondary | ICD-10-CM | POA: Insufficient documentation

## 2016-07-04 DIAGNOSIS — E119 Type 2 diabetes mellitus without complications: Secondary | ICD-10-CM | POA: Insufficient documentation

## 2016-07-04 DIAGNOSIS — Y999 Unspecified external cause status: Secondary | ICD-10-CM | POA: Diagnosis not present

## 2016-07-04 DIAGNOSIS — M25521 Pain in right elbow: Secondary | ICD-10-CM | POA: Diagnosis present

## 2016-07-04 DIAGNOSIS — Y9289 Other specified places as the place of occurrence of the external cause: Secondary | ICD-10-CM | POA: Insufficient documentation

## 2016-07-04 DIAGNOSIS — Z79899 Other long term (current) drug therapy: Secondary | ICD-10-CM | POA: Diagnosis not present

## 2016-07-04 MED ORDER — IBUPROFEN 400 MG PO TABS
600.0000 mg | ORAL_TABLET | Freq: Once | ORAL | Status: AC
  Administered 2016-07-04: 600 mg via ORAL
  Filled 2016-07-04: qty 1

## 2016-07-04 MED ORDER — ACETAMINOPHEN 500 MG PO TABS
500.0000 mg | ORAL_TABLET | Freq: Once | ORAL | Status: AC
  Administered 2016-07-04: 500 mg via ORAL
  Filled 2016-07-04: qty 1
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
## 2016-07-04 NOTE — Discharge Instructions (Signed)
Take tylenol and ibuprofen for pain. Rest, ice, and elevate arm. Follow up with orthopedist if symptoms worsen or fail to improve. Return to the ED if symptoms worsen.
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
## 2016-08-28 ENCOUNTER — Telehealth: Payer: Self-pay | Admitting: Internal Medicine

## 2016-08-28 NOTE — Telephone Encounter (Signed)
APT. REMINDER CALL, LMTCB °

## 2016-08-29 ENCOUNTER — Ambulatory Visit (INDEPENDENT_AMBULATORY_CARE_PROVIDER_SITE_OTHER): Payer: BLUE CROSS/BLUE SHIELD | Admitting: Internal Medicine

## 2016-08-29 ENCOUNTER — Encounter: Payer: Self-pay | Admitting: Internal Medicine

## 2016-08-29 VITALS — BP 119/69 | HR 92 | Temp 98.0°F | Ht 76.0 in | Wt 268.7 lb

## 2016-08-29 DIAGNOSIS — Z7984 Long term (current) use of oral hypoglycemic drugs: Secondary | ICD-10-CM

## 2016-08-29 DIAGNOSIS — Z0000 Encounter for general adult medical examination without abnormal findings: Secondary | ICD-10-CM

## 2016-08-29 DIAGNOSIS — Z720 Tobacco use: Secondary | ICD-10-CM

## 2016-08-29 DIAGNOSIS — I10 Essential (primary) hypertension: Secondary | ICD-10-CM

## 2016-08-29 DIAGNOSIS — E118 Type 2 diabetes mellitus with unspecified complications: Secondary | ICD-10-CM | POA: Diagnosis not present

## 2016-08-29 DIAGNOSIS — Z23 Encounter for immunization: Secondary | ICD-10-CM

## 2016-08-29 DIAGNOSIS — E1121 Type 2 diabetes mellitus with diabetic nephropathy: Secondary | ICD-10-CM

## 2016-08-29 DIAGNOSIS — F17210 Nicotine dependence, cigarettes, uncomplicated: Secondary | ICD-10-CM

## 2016-08-29 DIAGNOSIS — E119 Type 2 diabetes mellitus without complications: Secondary | ICD-10-CM

## 2016-08-29 DIAGNOSIS — Z6832 Body mass index (BMI) 32.0-32.9, adult: Secondary | ICD-10-CM

## 2016-08-29 DIAGNOSIS — Z79899 Other long term (current) drug therapy: Secondary | ICD-10-CM

## 2016-08-29 DIAGNOSIS — E785 Hyperlipidemia, unspecified: Secondary | ICD-10-CM

## 2016-08-29 DIAGNOSIS — M19071 Primary osteoarthritis, right ankle and foot: Secondary | ICD-10-CM | POA: Diagnosis not present

## 2016-08-29 LAB — POCT GLYCOSYLATED HEMOGLOBIN (HGB A1C): Hemoglobin A1C: 11.4

## 2016-08-29 LAB — GLUCOSE, CAPILLARY
Glucose-Capillary: 107 mg/dL — ABNORMAL HIGH (ref 65–99)
Glucose-Capillary: 79 mg/dL (ref 65–99)

## 2016-08-29 MED ORDER — GLIPIZIDE 5 MG PO TABS: 5.0000 mg | ORAL_TABLET | Freq: Two times a day (BID) | ORAL | 3 refills | Status: DC

## 2016-08-29 MED ORDER — ENALAPRIL MALEATE 10 MG PO TABS: 10.0000 mg | ORAL_TABLET | Freq: Every day | ORAL | 3 refills | Status: DC

## 2016-08-29 MED ORDER — DICLOFENAC SODIUM 1 % TD GEL: 2.0000 g | Freq: Four times a day (QID) | TRANSDERMAL | 2 refills | Status: DC

## 2016-08-29 MED ORDER — ASPIRIN EC 81 MG PO TBEC: 81.0000 mg | DELAYED_RELEASE_TABLET | Freq: Every day | ORAL | 2 refills | Status: AC

## 2016-08-29 MED ORDER — METFORMIN HCL 1000 MG PO TABS: 1000.0000 mg | ORAL_TABLET | Freq: Two times a day (BID) | ORAL | 11 refills | Status: DC

## 2016-08-29 MED ORDER — IBUPROFEN 600 MG PO TABS: 600.0000 mg | ORAL_TABLET | Freq: Four times a day (QID) | ORAL | 2 refills | Status: DC | PRN

## 2016-08-29 MED ORDER — PRAVASTATIN SODIUM 40 MG PO TABS: 40.0000 mg | ORAL_TABLET | Freq: Every evening | ORAL | 3 refills | Status: DC

## 2016-08-29 NOTE — Patient Instructions (Signed)
Please continue to take your medications as prescribed. We have increased your Metformin to 1000mg  twice daily. We have provided a prescription for Voltaren gel and Ibuprofen to take as needed for ankle pain. We have also provided a referral to physical therapy. Please schedule an appointment to see your Podiatrist to help with your ankle and foot issues.  Thanks for visiting today!
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
## 2016-08-29 NOTE — Assessment & Plan Note (Signed)
BP well within goal, 119/69 today. Continue 10 mg Enalapril daily.

## 2016-08-29 NOTE — Assessment & Plan Note (Signed)
Experiencing recurrent right ankle pain, worsened by walking/standing and beginning to impair her mobility and ability to work (at [employer]). Healed ulcer on medial midfoot. Reports seeing a podiatrist Jacqualyn Posey) often in the preceding months. MRI in April 2017 revealed severe lateral tibiotalar cartilage loss, subchondral cystic changes, and flexor digitorum longus tenosynovitis. 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 Patients taking various NSAID (Ibuprofen, Naproxen) and/or Percocet in the past with modest effectiveness. She also inquires about the availability of diabetic/orthopedic shoes to help with her chronic foot pain.  Plan: - Defer special shoes and foot care to Dr. Jacqualyn Posey - patient well-acquainted with this provider and plans to see him again in near future - Provided referral to physical therapy to improve mobility and strength in setting of chronic foot/ankle pain - Provided Rx for Ibuprofen 600mg  TID PRN - Provided Rx for Diclofenac gel 2g QID PRN - Encouraged to stay active and continue soaks/symptom management, as well as compliance with diabetic regimen

## 2016-08-29 NOTE — Assessment & Plan Note (Addendum)
Provided TDap booster, patient declined flu vaccine but may reconsider in future. Stated plan to undergo pap smear via her ob/gyn in near future.  Started Aspirin 81mg  daily for primary CV prevention

## 2016-08-29 NOTE — Assessment & Plan Note (Signed)
Worsening diabetic control with HbA1c now 11.4 from 9.9 in April 2017. Reports losing her insurance this summer and went without any medications for 1-2 months. She also had an infected ulcer on her medial right foot that has since healed. At that time reported experiencing significant polyuria, polydipsia, headaches, fatigue/dehydration, and 20 lb weight loss. She recovered her insurance and has been taking Metformin 1000 mg QD and Glipizide 5mg  BID since late August. Januvia costing $100+ despite her insurance so has not been able to afford this medication. Her symptoms have resolves since restarting her medications. She is experiencing significant right ankle pain that flares up when she works (on her feet). She has chronic diabetic foot changes with neuropathy, hammer toes, arch-loss. She completed her diabetic foot exam today.  Plan: - Increase to Metformin 1000mg  BID - Continue Glipizide 5mg  BID - Encourage diet and exercise modification, weight loss - Reassess A1c in 3 months

## 2016-08-29 NOTE — Assessment & Plan Note (Addendum)
Continues to smoke 1/2 ppd, ongoing stressors including health issues, caring for elderly mother, not motivated to realistically pursue cessation at this time. Will defer to future visit perhaps when other medical issues are under better control.

## 2016-08-30 LAB — CMP14 + ANION GAP
ALBUMIN: 4.7 g/dL (ref 3.5–5.5)
ALK PHOS: 65 IU/L (ref 39–117)
ALT: 11 IU/L (ref 0–32)
AST: 13 IU/L (ref 0–40)
Albumin/Globulin Ratio: 1.6 (ref 1.2–2.2)
Anion Gap: 20 mmol/L — ABNORMAL HIGH (ref 10.0–18.0)
BUN / CREAT RATIO: 17 (ref 9–23)
BUN: 13 mg/dL (ref 6–24)
CHLORIDE: 101 mmol/L (ref 96–106)
CO2: 21 mmol/L (ref 18–29)
Calcium: 9.8 mg/dL (ref 8.7–10.2)
Creatinine, Ser: 0.75 mg/dL (ref 0.57–1.00)
GFR calc non Af Amer: 95 mL/min/{1.73_m2} (ref >59)
GFR, EST AFRICAN AMERICAN: 110 mL/min/{1.73_m2} (ref >59)
GLOBULIN, TOTAL: 3 g/dL (ref 1.5–4.5)
Glucose: 71 mg/dL (ref 65–99)
Potassium: 4.2 mmol/L (ref 3.5–5.2)
SODIUM: 142 mmol/L (ref 134–144)
TOTAL PROTEIN: 7.7 g/dL (ref 6.0–8.5)

## 2016-08-30 LAB — CBC
HEMATOCRIT: 38.3 % (ref 34.0–46.6)
HEMOGLOBIN: 13.1 g/dL (ref 11.1–15.9)
MCH: 27.8 pg (ref 26.6–33.0)
MCHC: 34.2 g/dL (ref 31.5–35.7)
MCV: 81 fL (ref 79–97)
Platelets: 464 10*3/uL — ABNORMAL HIGH (ref 150–379)
RBC: 4.72 x10E6/uL (ref 3.77–5.28)
RDW: 14.8 % (ref 12.3–15.4)
WBC: 10.5 10*3/uL (ref 3.4–10.8)

## 2016-09-01 ENCOUNTER — Encounter: Payer: BLUE CROSS/BLUE SHIELD | Admitting: Clinical

## 2016-09-01 ENCOUNTER — Ambulatory Visit (INDEPENDENT_AMBULATORY_CARE_PROVIDER_SITE_OTHER): Payer: BLUE CROSS/BLUE SHIELD

## 2016-09-01 VITALS — BP 156/89 | HR 72

## 2016-09-01 DIAGNOSIS — Z3042 Encounter for surveillance of injectable contraceptive: Secondary | ICD-10-CM

## 2016-09-01 MED ORDER — MEDROXYPROGESTERONE ACETATE 150 MG/ML IM SUSP
150.0000 mg | Freq: Once | INTRAMUSCULAR | Status: AC
  Administered 2017-02-02: 150 mg via INTRAMUSCULAR

## 2016-09-01 NOTE — Progress Notes (Signed)
Patient presented to the office today for a depo-provera injection received in left arm IM. Patient blood pressure was a little elevated today bit she stated she has not taken her medications today. She plans to take it when she goes home for the evening. Patient plans to make a follow up appointment in the next couple weeks for her annual exam.

## 2016-09-01 NOTE — BH Specialist Note (Deleted)
Session Start time: ***   End Time: *** Total Time:  *** Type of Service: [place] Interpreter: {yes [id number]   Interpreter Name & Language: *** # [facility] Visits July 2017-June 2018: ***   SUBJECTIVE: Tina [name] is a 45 y.o. female  Pt. was referred by *** for:  {SYMPTOMS; BEHAVIORAL/PSYCH:19146}. Pt. reports the following symptoms/concerns: *** Duration of problem:  *** Severity: {DESC;mild/moderate/severe:33302} Previous treatment: ***   OBJECTIVE: Mood: {BHH MOOD:22306} & Affect: {BHH AFFECT:22307} Risk of harm to self or others: *** Assessments administered: ***  LIFE CONTEXT:  Family & Social: *** (Who,family proximity, relationship, friends) Higher education careers adviser Work: *** (Where, how often, or financial support) Self-Care: *** (Exercise, sleep, eat, substances) Life changes: *** What is important to pt/family (values): ***   GOALS ADDRESSED:  ***  INTERVENTIONS: {CHL AMB BH Type of Intervention:21022753}   ASSESSMENT:  Pt currently experiencing ***.  Pt may benefit from ***.      PLAN: 1. F/U with behavioral health clinician: *** 2. Behavioral Health meds: *** 3. Behavioral recommendations: *** 4. Referral: {BH Clinician [phone]} 5. From scale of 1-10, how likely are you to follow plan: ***   Garlan Fair LCSWA Behavioral Health Clinician  Geraldine Contras: ***  Depression screen [facility] 2/9 08/29/2016 06/12/2016 03/10/2016 12/12/2015 11/14/2015  Decreased Interest 0 1 0 0 0  Down, Depressed, Hopeless 0 3 0 1 0  PHQ - 2 Score 0 4 0 1 0  Altered sleeping - 3 - - -  Tired, decreased energy - 3 - - -  Change in appetite - 1 - - -  Feeling bad or failure about yourself  - 3 - - -  Trouble concentrating - 1 - - -  Moving slowly or fidgety/restless - 1 - - -  Suicidal thoughts - 0 - - -  PHQ-9 Score - 16 - - -   GAD 7 : Generalized Anxiety Score 06/12/2016  Nervous, Anxious, on Edge 2  Control/stop worrying 3  Worry too  much - different things 3  Trouble relaxing 2  Restless 2  Easily annoyed or irritable 3  Afraid - awful might happen 1  Total GAD 7 Score 16

## 2016-09-02 NOTE — Progress Notes (Signed)
Internal Medicine Clinic Attending  I saw and evaluated the patient.  I personally confirmed the key portions of the history and exam documented by Dr. Johnson and I reviewed pertinent patient test results.  The assessment, diagnosis, and plan were formulated together and I agree with the documentation in the resident's note.  

## 2016-09-08 ENCOUNTER — Ambulatory Visit: Payer: BLUE CROSS/BLUE SHIELD | Attending: Pediatrics

## 2016-09-08 DIAGNOSIS — R262 Difficulty in walking, not elsewhere classified: Secondary | ICD-10-CM | POA: Insufficient documentation

## 2016-09-08 DIAGNOSIS — M25671 Stiffness of right ankle, not elsewhere classified: Secondary | ICD-10-CM | POA: Diagnosis present

## 2016-09-08 DIAGNOSIS — M19071 Primary osteoarthritis, right ankle and foot: Secondary | ICD-10-CM | POA: Diagnosis present

## 2016-09-08 DIAGNOSIS — R600 Localized edema: Secondary | ICD-10-CM | POA: Insufficient documentation

## 2016-09-08 NOTE — Therapy (Addendum)
[facility] [place], [place], [address] Phone: [phone]   Fax:  [phone]  Physical Therapy Evaluation/Dischrge  Patient Details  Name: Tina [name] MRN: [id number] Date of Birth: [birth year] Referring Provider: Asencion Partridge, MD  Encounter Date: 09/08/2016      PT End of Session - 09/08/16 1550    Visit Number 1   Number of Visits 6   Date for PT Re-Evaluation 10/03/16   Authorization Type BC[employer]   PT Start Time 0300   PT Stop Time 0340   PT Time Calculation (min) 40 min   Activity Tolerance Patient tolerated treatment well;No increased pain   Behavior During Therapy WFL for tasks assessed/performed      Past Medical History:  Diagnosis Date  . Diabetes mellitus    type II  . Fibroids   . HTN (hypertension)   . Hyperlipidemia   . Iron deficiency anemia   . Left acetabular fracture ([place])   . Lumbar strain   . Obesity   . Ovarian cyst     Past Surgical History:  Procedure Laterality Date  . FOOT ARTHRODESIS, MODIFIED [facility]     right foot bunion surgery and ankle surgery 1980s  . 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       Subjective Assessment - 09/08/16 1505    Subjective She reports She was seeing ortho MD and received injection. She has had surgery on RT ankle in past with fusion.  Ankle swells all time  .   Catch in ankle  She reports MD suggested orthopedic shoes . She wears compression hose at work    Pertinent History RT ankle fusion, diabetic ulcers   Limitations Standing;Walking   How long can you sit comfortably? As needed   How long can you stand comfortably? 2-3 hours at work. pain with standing generally   How long can you walk comfortably? Work and home elevates foot at home   Diagnostic tests xrays   Patient Stated Goals try to feel better , ease pain.    Currently in Pain? Yes   Pain Score 8     Pain Location Ankle   Pain Orientation Right   Pain Descriptors / Indicators Aching   Pain Type Chronic pain   Pain Frequency Constant   Aggravating Factors  weight bearing   Pain Relieving Factors meds can ease pain    Multiple Pain Sites No            [facility] PT Assessment - 09/08/16 1500      Assessment   Medical Diagnosis OA RT ankle   Referring Provider Tina Partridge, MD   Onset Date/Surgical Date --  2 years   Next MD Visit podiatrist  to be set, Tina [name] in  a month   Prior Therapy No     Precautions   Precautions None     Restrictions   Weight Bearing Restrictions No     Balance Screen   Has the patient fallen in the past 6 months No   Has the patient had a decrease in activity level because of a fear of falling?  Yes  due to pain and selling   Is the patient reluctant to leave their home because of a fear of falling?  No     Home Ec[employer] residence   Living Arrangements  Other (Comment)   Type of [place] to enter   Entrance Stairs-Number of Steps 6   Entrance Stairs-Rails Right;Left;Can reach both   Home Layout One level     Prior Function   Level of Independence Independent     Cognition   Overall Cognitive Status Within Functional Limits for tasks assessed     Posture/Postural Control   Posture Comments pes planus RT  Valgus LT great toe.      AROM   Overall AROM Comments Great toe DF 0   AROM Assessment Site Ankle   Right/Left Ankle Right;Left   Right Ankle Dorsiflexion 75   Right Ankle Plantar Flexion 18   Right Ankle Inversion -8   Right Ankle Eversion 15   Left Ankle Dorsiflexion 93   Left Ankle Plantar Flexion 50   Left Ankle Inversion 25   Left Ankle Eversion 16     PROM   PROM Assessment Site Ankle   Right/Left Ankle Right   Right Ankle Dorsiflexion 82   Right Ankle Plantar Flexion 20   Right Ankle Inversion -5   Right Ankle Eversion 15     Strength   Strength  Assessment Site Ankle   Right/Left Ankle Right;Left     Ambulation/Gait   Gait Comments walk antalgic Rt dcr weight Rt with leg abduction  , walks on lateral aspect Lt foot.  decr knee flexion and legs abducted, hyper extension RT/LT knee with laxity                           PT Education - 09/08/16 1550    Education provided Yes   Education Details POC   Person(s) Educated Patient   Methods Explanation   Comprehension Verbalized understanding             PT Long Term Goals - 09/08/16 1546      PT LONG TERM GOAL #1   Title She will be independent with inital HEP   Time 3   Period Weeks   Status New     PT LONG TERM GOAL #2   Title She will report pain decr 25% with work.    Time 3   Period Weeks   Status New     PT LONG TERM GOAL #3   Title Active DF and PF increased 10 degrees to decr pain with walking   Time 3   Period Weeks   Status New     PT LONG TERM GOAL #4   Title She will use asssitive device for general walking and report incr comfort   Time 3   Period Weeks   Status New               Plan - 09/08/16 1541    Clinical Impression Statement Tina [name] presents with moderate complexity eval due to chronic RT ankle pain and swelling with history of fusion and  significant degenerative changes in R ankle. She works on her feet 8 hours per day . She has minimal active and passiv eROM RT ankkle and flat foot . Decreased weight to  RT leg  with antalgic gait,  Trial of PT to see if she can be helped   Rehab Potential Fair   PT Frequency 2x / week   PT Duration 3 weeks  If pain and range improved will ask for extension   PT Treatment/Interventions Cryotherapy;Ultrasound;Passive range of motion;Patient/family education;Manual techniques;Therapeutic exercise  PT Next Visit Plan Manual for pain and ROM [place] HEP for ROM   Consulted and Agree with Plan of Care Patient      Patient will benefit from skilled therapeutic intervention in  order to improve the following deficits and impairments:  Pain, Postural dysfunction, Decreased balance, Decreased activity tolerance, Decreased range of motion, Difficulty walking, Abnormal gait  Visit Diagnosis: Osteoarthritis of right ankle, unspecified osteoarthritis type  Stiffness of right ankle, not elsewhere classified  Difficulty in walking, not elsewhere classified  Localized edema     Problem List Patient Active Problem List   Diagnosis Date Noted  . Osteoarthritis of right ankle 08/29/2016  . Tobacco abuse 01/21/2016  . Severe obesity (BMI 35.0-39.9) ([place]) 10/12/2015  . Foot ulcer, right ([place]) 09/13/2015  . Back pain 05/19/2015  . Chronic constipation 02/07/2013  . Ovarian cyst 12/24/2012  . Hypertension 07/20/2012  . Iron deficiency anemia 07/08/2011  . Preventative health care 06/27/2011  . Uncomplicated type 2 diabetes mellitus ([place]) 05/30/2010  . Dyslipidemia 05/30/2010  . 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    Darrel Hoover  PT 09/08/2016, 3:51 PM  [facility] [address] [place], [place], [address] Phone: [phone]   Fax:  [phone]  Name: Tina [name] MRN: [id number] Date of Birth: May 11, 1971  PHYSICAL THERAPY DISCHARGE SUMMARY  Visits from Start of Care: 1 Current functional level related to goals / functional outcomes: Unknown as she no showed 5 appointments   Remaining deficits:Unknown   Education / Equipment: NA Plan:                                                    Patient goals were not met. Patient is being discharged due to not returning since the last visit.  ?????    Lillette Boxer Keshawn Fiorito  PT   10/02/16    5:57 PM

## 2016-09-11 ENCOUNTER — Ambulatory Visit: Payer: BLUE CROSS/BLUE SHIELD

## 2016-09-16 ENCOUNTER — Ambulatory Visit: Payer: BLUE CROSS/BLUE SHIELD

## 2016-09-18 ENCOUNTER — Ambulatory Visit: Payer: BLUE CROSS/BLUE SHIELD

## 2016-09-22 ENCOUNTER — Ambulatory Visit: Payer: BLUE CROSS/BLUE SHIELD

## 2016-09-29 ENCOUNTER — Ambulatory Visit: Payer: BLUE CROSS/BLUE SHIELD

## 2016-10-02 ENCOUNTER — Ambulatory Visit: Payer: BLUE CROSS/BLUE SHIELD

## 2016-10-06 ENCOUNTER — Ambulatory Visit: Payer: BLUE CROSS/BLUE SHIELD | Admitting: Obstetrics & Gynecology

## 2016-10-21 ENCOUNTER — Telehealth: Payer: Self-pay | Admitting: Internal Medicine

## 2016-10-21 NOTE — Telephone Encounter (Signed)
APT. REMINDER CALL, LMTCB °

## 2016-10-22 ENCOUNTER — Ambulatory Visit (INDEPENDENT_AMBULATORY_CARE_PROVIDER_SITE_OTHER): Payer: BLUE CROSS/BLUE SHIELD | Admitting: Pulmonary Disease

## 2016-10-22 ENCOUNTER — Encounter (INDEPENDENT_AMBULATORY_CARE_PROVIDER_SITE_OTHER): Payer: Self-pay

## 2016-10-22 DIAGNOSIS — T148XXA Other injury of unspecified body region, initial encounter: Secondary | ICD-10-CM | POA: Insufficient documentation

## 2016-10-22 DIAGNOSIS — M542 Cervicalgia: Secondary | ICD-10-CM | POA: Diagnosis not present

## 2016-10-22 DIAGNOSIS — M25561 Pain in right knee: Secondary | ICD-10-CM | POA: Diagnosis not present

## 2016-10-22 MED ORDER — CYCLOBENZAPRINE HCL 5 MG PO TABS: 5.0000 mg | ORAL_TABLET | Freq: Three times a day (TID) | ORAL | 0 refills | Status: DC | PRN

## 2016-10-22 NOTE — Patient Instructions (Addendum)
NECK PAIN OVERVIEW - Neck pain can be caused by a number of factors, including muscle strain, ligament sprains, arthritis, or a "pinched" nerve. Approximately 10 percent of adults have neck pain at any one time. The majority of patients, regardless of the cause of pain, recover with conservative therapy.  Cervical strain - Cervical muscle strain can occur when there is an injury to the muscles of the neck, causing spasm of the cervical and upper back muscles. Cervical strain may result from the physical stresses of everyday life, including poor posture, muscle tension from psychologic stress, or poor sleeping habits. The mechanism of injury in sports concussions can also result in cervical strain. Typically, cervical strain symptoms include pain, stiffness, and tightness in the upper back or shoulder, which may last for up to six weeks.  Whiplash injury - The cervical whiplash syndrome is caused by a traumatic event that causes an abrupt forward/backward movement of the cervical spine. The most common cause of whiplash is a motor vehicle accident. Symptoms of whiplash include severe pain, spasm, and loss of range of motion in the neck.  Cervical myofascial pain - Myofascial pain causes tight and tender areas of muscle that are sensitive to pressure. Myofascial pain in the neck can develop after trauma or with other medical conditions, such as psychologic stress, depression, or insomnia.  NECK PAIN TREATMENTS - In most cases, neck pain can be treated conservatively with over-the-counter pain medications, ice, heat and massage, and strengthening and/or stretching exercises at home. If pain does not improve after a few weeks of conservative treatment, further evaluation is usually recommended.  Pain relief - Acetaminophen (eg, Tylenol and others) or a nonsteroidal antiinflammatory medication (eg, ibuprofen, naproxen) may be helpful to relieve mild to moderate neck pain.  For people with severe muscle spasm,  a muscle relaxant may be recommended. For people with severe neck pain, a tricyclic antidepressant may also be recommended. Both tricyclic antidepressants and muscle relaxants can cause a person to feel sleepy.  Ice - For some people, ice can reduce the severity of neck pain. It can be applied directly to the sore area of the neck. Ice can be frozen in a paper cup, and then the upper edge of the cup can be torn away. The ice should be moved continuously in strokes on the neck muscles for five to seven minutes.  To control sudden onset muscle tightness, place a bag of ice, bag of frozen peas, or a frozen towel wrapped in a dry towel, on the painful area. The ice should be left in place for 15 to 20 minutes to deeply penetrate the tissues; this can be repeated every two to four hours until symptoms improve.  The skin can become injured with excessive use of ice, especially in those with poor skin sensation. The skin should be inspected with each ice application for changes in pigmentation (eg, lighter or darker colored skin) and any changes should be reported to a healthcare provider.  Heat - Heat can help to reduce pain in the neck muscles. Moist heat can be applied for 10 to 15 minutes in a shower, hot bath, or with a moist towel warmed in a microwave. However, acute injuries should utilize ice as the initial treatment. Heat may be used initially for patients who have cold intolerance to ice.  It is important to avoid overheating the towel and potentially injuring the skin, especially in people with poor skin sensation. The skin can become pigmented (discolored) in a blotchy  pattern in people who use heat frequently.  Massage - Massage can be helpful for relieving muscle spasm and can be performed after heating or icing the neck. Massage can be done with the hands by applying pressure to both sides of the neck and the upper back muscles, or with an electric hand-held vibrator. The neck muscles should be  relaxed during massage by supporting the head or lying down.  Stretching exercises - The range of motion of the neck must be restored and preserved after an injury; this is done with exercises that stretch and strengthen the neck muscles. Range of motion exercises and stretching may help decrease pain from muscle injury. It is best to perform stretching exercises when the muscles are warm, such as after the application of heat, or after a few minutes of cardiovascular warm-up exercises.  Exercises can be performed in the morning to relieve stiffness and again at night before going to bed. Expect mild, achy muscle pain; sharp or electric pain in the shoulder or arm is not normal and should be reported to a healthcare provider.  Do not attempt to perform these exercises if you have a pinched nerve in the neck, especially if there is pain or numbness into the arm and hand, unless recommended by a healthcare provider. While these exercises can dramatically improve symptoms of a pinched nerve, they can actually make the problem worse if performed improperly.  The most useful stretching exercises for the neck include the following:  Neck bending - Tilt the head forward and try to touch your chin to your neck. Hold for a few seconds, breathe in gradually, and exhale slowly with each exercise. Exhaling with the movement helps relax the muscles. Repeat 10 to 15 times. Relax the neck and back muscles with each neck bend.  Shoulder rolls - In the sitting or standing position, hold the arms at the side with the elbows bent. Try to pinch the shoulder blades together. Roll the shoulders backwards 10 to 15 times, moving in a rhythmic, rowing motion. Rest. Roll the shoulders forwards 10 to 15 times.  Other exercise may include neck rotation, neck tilting, vertical shoulder stretches, upper back stretches, and back bending.  Neck rotation - Slowly look to the right. Hold for a few seconds. Look to the center. Rest  for a few seconds between movements. Repeat 10 to 15 times. Perform on the left side.  Neck tilting - Look straight forward, then tilt the top of the head to the right, trying to touch your right ear to the right shoulder (without moving the shoulder). Hold in place for a few seconds. Return the head to the center. Repeat 10 to 15 times. Repeat on the left side.  Vertical shoulder stretches - In the sitting or standing position, use the right hand to hold the left wrist and pull the arm (and shoulder) up and over the head, towards the right. Hold for five seconds. Keep the left shoulder and back muscles relaxed. Rest and repeat 10 to 15 times. Repeat using left hand to hold right wrist.  Upper back stretches - In the standing position, lean forward from the hips and rest both hands on a low counter with the elbows straight. Exhale, relax the neck and shoulders, and allow the head to fall forward as you round the upper back. This requires the shoulder blades to spread apart and mimics the motion of a cat stretching its back. Exhaling with the motion helps to relax the muscles.  Return to the standing position with hands on a counter. Repeat slowly 10 times.  Upper back side bends - Stand or sit up straight in a chair. Bend the trunk to the right while holding the hands together slightly behind the neck for support. Hold for five seconds, and then return to center. Repeat to the left. Repeat 10 to 15 times. Keep the lower back straight or supported against a chair.  Posture - Activities and body positions that prevent or reduce neck pain include those that emphasize a neutral neck position and minimize tension across the supporting muscles and ligaments of the neck. Extremes of range of motion, activities, and body positions that cause constant tension should be minimized or avoided:  Avoid sitting in the same position for prolonged periods of time. Take periodic five minute breaks from the desk, work  station, etc. Avoid looking up or down at a computer monitor; adjust it to eye level.  Avoid placing pressure over the upper back with backpacks, over-the-shoulder purses, or children riding on your shoulders. Alternatives may include wheeled backpacks or cases, handbags, and having a child walk or ride in a stroller.  Do not perform overhead work for prolonged periods at a time.  Maintain good posture by holding your head up and keeping your shoulders back and down.  Use the car or chair arm rests to keep the arms supported.  Sleep with your neck in a neutral position by sleeping with a small pillow under the nape of your neck (sleeping on your back) or sleeping with enough pillows to keep your neck straight in line with your body (sleeping on your side). If you sleep on your back, putting a pillow under the knees can help to flatten the spine and relax the neck muscles. Avoid sleeping on the stomach with the head turned.  Carry heavy objects close to your body rather than with outstretched arms.  Contact a doctor if:  You have pain or other symptoms that get worse.  You have symptoms that do not get better after 2 weeks.  You have pain that does not get better with medicine.  You start to have new, unexplained symptoms.  You have sores or irritated skin from wearing your neck collar. Get help right away if:  You have very bad pain.  You have any of the following in any part of your body:  Loss of feeling (numbness).  Tingling.  Weakness.  You cannot move a part of your body (you have paralysis).  Your activity level does not improve.
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
## 2016-10-22 NOTE — Progress Notes (Signed)
Internal Medicine Clinic Attending  Case discussed with Dr. Krall at the time of the visit.  We reviewed the resident's history and exam and pertinent patient test results.  I agree with the assessment, diagnosis, and plan of care documented in the resident's note.  

## 2016-11-17 ENCOUNTER — Ambulatory Visit (INDEPENDENT_AMBULATORY_CARE_PROVIDER_SITE_OTHER): Payer: BLUE CROSS/BLUE SHIELD | Admitting: *Deleted

## 2016-11-17 DIAGNOSIS — Z3042 Encounter for surveillance of injectable contraceptive: Secondary | ICD-10-CM | POA: Diagnosis not present

## 2016-11-17 MED ORDER — MEDROXYPROGESTERONE ACETATE 150 MG/ML IM SUSP
150.0000 mg | Freq: Once | INTRAMUSCULAR | Status: AC
  Administered 2016-11-17: 150 mg via INTRAMUSCULAR

## 2016-12-16 ENCOUNTER — Emergency Department (HOSPITAL_BASED_OUTPATIENT_CLINIC_OR_DEPARTMENT_OTHER): Payer: BLUE CROSS/BLUE SHIELD

## 2016-12-16 ENCOUNTER — Encounter (HOSPITAL_BASED_OUTPATIENT_CLINIC_OR_DEPARTMENT_OTHER): Payer: Self-pay | Admitting: *Deleted

## 2016-12-16 ENCOUNTER — Emergency Department (HOSPITAL_BASED_OUTPATIENT_CLINIC_OR_DEPARTMENT_OTHER)
Admission: EM | Admit: 2016-12-16 | Discharge: 2016-12-16 | Disposition: A | Discharge status: Home / Self Care | Payer: BLUE CROSS/BLUE SHIELD | Attending: Emergency Medicine | Admitting: Emergency Medicine

## 2016-12-16 DIAGNOSIS — Z79899 Other long term (current) drug therapy: Secondary | ICD-10-CM | POA: Insufficient documentation

## 2016-12-16 DIAGNOSIS — E119 Type 2 diabetes mellitus without complications: Secondary | ICD-10-CM | POA: Diagnosis not present

## 2016-12-16 DIAGNOSIS — J189 Pneumonia, unspecified organism: Secondary | ICD-10-CM

## 2016-12-16 DIAGNOSIS — Z7984 Long term (current) use of oral hypoglycemic drugs: Secondary | ICD-10-CM | POA: Insufficient documentation

## 2016-12-16 DIAGNOSIS — F17210 Nicotine dependence, cigarettes, uncomplicated: Secondary | ICD-10-CM | POA: Insufficient documentation

## 2016-12-16 DIAGNOSIS — Z7982 Long term (current) use of aspirin: Secondary | ICD-10-CM | POA: Insufficient documentation

## 2016-12-16 DIAGNOSIS — I10 Essential (primary) hypertension: Secondary | ICD-10-CM | POA: Insufficient documentation

## 2016-12-16 DIAGNOSIS — R05 Cough: Secondary | ICD-10-CM | POA: Diagnosis present

## 2016-12-16 LAB — COMPREHENSIVE METABOLIC PANEL
ALK PHOS: 67 U/L (ref 38–126)
ALT: 15 U/L (ref 14–54)
AST: 16 U/L (ref 15–41)
Albumin: 4.3 g/dL (ref 3.5–5.0)
Anion gap: 10 (ref 5–15)
BUN: 11 mg/dL (ref 6–20)
CALCIUM: 9.3 mg/dL (ref 8.9–10.3)
CHLORIDE: 99 mmol/L — AB (ref 101–111)
CO2: 25 mmol/L (ref 22–32)
CREATININE: 0.68 mg/dL (ref 0.44–1.00)
Glucose, Bld: 346 mg/dL — ABNORMAL HIGH (ref 65–99)
Potassium: 3.8 mmol/L (ref 3.5–5.1)
SODIUM: 134 mmol/L — AB (ref 135–145)
Total Bilirubin: 0.4 mg/dL (ref 0.3–1.2)
Total Protein: 8.3 g/dL — ABNORMAL HIGH (ref 6.5–8.1)

## 2016-12-16 LAB — CBC WITH DIFFERENTIAL/PLATELET
Basophils Absolute: 0 10*3/uL (ref 0.0–0.1)
Basophils Relative: 0 %
EOS ABS: 0.2 10*3/uL (ref 0.0–0.7)
EOS PCT: 2 %
HCT: 38.1 % (ref 36.0–46.0)
Hemoglobin: 13 g/dL (ref 12.0–15.0)
LYMPHS ABS: 3.2 10*3/uL (ref 0.7–4.0)
LYMPHS PCT: 34 %
MCH: 28.4 pg (ref 26.0–34.0)
MCHC: 34.1 g/dL (ref 30.0–36.0)
MCV: 83.4 fL (ref 78.0–100.0)
MONOS PCT: 6 %
Monocytes Absolute: 0.6 10*3/uL (ref 0.1–1.0)
Neutro Abs: 5.6 10*3/uL (ref 1.7–7.7)
Neutrophils Relative %: 58 %
PLATELETS: 409 10*3/uL — AB (ref 150–400)
RBC: 4.57 MIL/uL (ref 3.87–5.11)
RDW: 12.2 % (ref 11.5–15.5)
WBC: 9.6 10*3/uL (ref 4.0–10.5)

## 2016-12-16 LAB — URINALYSIS, ROUTINE W REFLEX MICROSCOPIC
BILIRUBIN URINE: NEGATIVE
HGB URINE DIPSTICK: NEGATIVE
Ketones, ur: NEGATIVE mg/dL
Leukocytes, UA: NEGATIVE
Nitrite: NEGATIVE
PROTEIN: NEGATIVE mg/dL
Specific Gravity, Urine: 1.036 — ABNORMAL HIGH (ref 1.005–1.030)
pH: 6 (ref 5.0–8.0)

## 2016-12-16 LAB — URINALYSIS, MICROSCOPIC (REFLEX)

## 2016-12-16 LAB — LIPASE, BLOOD: LIPASE: 24 U/L (ref 11–51)

## 2016-12-16 LAB — I-STAT CG4 LACTIC ACID, ED: LACTIC ACID, VENOUS: 1.6 mmol/L (ref 0.5–1.9)

## 2016-12-16 MED ORDER — AZITHROMYCIN 250 MG PO TABS: 250.0000 mg | ORAL_TABLET | Freq: Every day | ORAL | 0 refills | Status: DC

## 2016-12-16 MED ORDER — ONDANSETRON HCL 4 MG/2ML IJ SOLN
4.0000 mg | Freq: Once | INTRAMUSCULAR | Status: AC
  Administered 2016-12-16: 4 mg via INTRAVENOUS
  Filled 2016-12-16: qty 2

## 2016-12-16 MED ORDER — SODIUM CHLORIDE 0.9 % IV BOLUS (SEPSIS)
1000.0000 mL | Freq: Once | INTRAVENOUS | Status: AC
  Administered 2016-12-16: 1000 mL via INTRAVENOUS

## 2016-12-16 MED ORDER — ONDANSETRON HCL 4 MG PO TABS: 4.0000 mg | ORAL_TABLET | Freq: Three times a day (TID) | ORAL | 0 refills | Status: DC | PRN

## 2016-12-16 MED FILL — AZITHROMYCIN 250 MG TABLET: 250 | 5 days supply | Qty: 6 | Fill #0

## 2016-12-16 MED FILL — ONDANSETRON HCL 4 MG TABLET: 4 | 4 days supply | Qty: 12 | Fill #0

## 2016-12-16 NOTE — Discharge Instructions (Signed)
Please take her antibiotics as prescribed. Please take 2 pills the first day and then 1 pill for days 2 through 5. Please follow-up with your primary care physician for further management of your symptoms. If anything continues to worsen, please return to the nearest emergency department. Please take your nausea medication as needed to help stay hydrated and no vomit.

## 2016-12-16 NOTE — ED Triage Notes (Signed)
Pt reports she went to a children's birthday party on Sunday and has not felt well since, reports chills, body aches and fatigue.

## 2016-12-16 NOTE — ED Notes (Signed)
Second lactic acid not needed per MD.

## 2016-12-16 NOTE — ED Provider Notes (Signed)
Tina [name]   CSN: [id number] Arrival date & time: 12/16/16  1005     History   Chief Complaint Chief Complaint  Patient presents with  . Fatigue    HPI Tina [name] is a 46 y.o. female   The history is provided by the patient and medical records.  Cough  This is a new problem. The current episode started more than 2 days ago. The problem occurs constantly. The problem has not changed since onset.The cough is non-productive. There has been no fever. Associated symptoms include chills, rhinorrhea and myalgias. Pertinent negatives include no chest pain, no headaches, no shortness of breath and no wheezing. She has tried nothing for the symptoms. The treatment provided no relief. Risk factors: sick contacts.    Past Medical History:  Diagnosis Date  . Diabetes mellitus    type II  . Fibroids   . HTN (hypertension)   . Hyperlipidemia   . Iron deficiency anemia   . Left acetabular fracture ([place])   . Lumbar strain   . Obesity   . Ovarian cyst     Patient Active Problem List   Diagnosis Date Noted  . Neck pain 10/22/2016  . Osteoarthritis of right ankle 08/29/2016  . Tobacco abuse 01/21/2016  . Severe obesity (BMI 35.0-39.9) ([place]) 10/12/2015  . Foot ulcer, right ([place]) 09/13/2015  . Back pain 05/19/2015  . Chronic constipation 02/07/2013  . Ovarian cyst 12/24/2012  . Right knee pain 12/09/2012  . Hypertension 07/20/2012  . Iron deficiency anemia 07/08/2011  . Preventative health care 06/27/2011  . Uncomplicated type 2 diabetes mellitus ([place]) 05/30/2010  . Dyslipidemia 05/30/2010  . 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    Past Surgical History:  Procedure Laterality Date  . FOOT ARTHRODESIS, MODIFIED [facility]     right foot bunion surgery and ankle surgery 1980s  . 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 Authorizing Provider  aspirin EC 81 MG tablet Take 1 tablet (81 mg total) by mouth daily. 08/29/16 08/29/17  Asencion Partridge, MD  cyclobenzaprine (FLEXERIL) 5 MG tablet Take 1 tablet (5 mg total) by mouth 3 (three) times daily as needed for muscle spasms. 10/22/16   Milagros Loll, MD  diclofenac sodium (VOLTAREN) 1 % GEL Apply 2 g topically 4 (four) times daily. 08/29/16   Asencion Partridge, MD  enalapril (VASOTEC) 10 MG tablet Take 1 tablet (10 mg total) by mouth daily. 08/29/16   Asencion Partridge, MD  glipiZIDE (GLUCOTROL) 5 MG tablet Take 1 tablet (5 mg total) by mouth 2 (two) times daily. 08/29/16   Asencion Partridge, MD  ibuprofen (ADVIL,MOTRIN) 600 MG tablet Take 1 tablet (600 mg total) by mouth every 6 (six) hours as needed. 08/29/16   Asencion Partridge, MD  metFORMIN (GLUCOPHAGE) 1000 MG tablet Take 1 tablet (1,000 mg total) by mouth 2 (two) times daily with a meal. 08/29/16 08/29/17  Asencion Partridge, MD  Multiple Vitamins-Minerals (MULTIVITAMIN WITH MINERALS) tablet Take 1 tablet by mouth daily.      Historical Provider,  MD  pravastatin (PRAVACHOL) 40 MG tablet Take 1 tablet (40 mg total) by mouth every evening. 08/29/16 08/28/17  Asencion Partridge, MD    Family History Family History  Problem Relation Age of Onset  . Colon cancer Sister   . Diabetes Mother   . Diabetes Father   . Diabetes Brother   . Celiac disease Paternal Aunt     Social History Social History  Substance Use Topics  . Smoking status: Current Every Day Smoker    Packs/day: 0.50    Years: 23.00    Types: Cigarettes  . Smokeless tobacco: Never Used     Comment: 1/2PPD  . Alcohol use 0.0 oz/week     Comment: occ     Allergies   Codeine   Review of Systems Review of Systems  Constitutional: Positive for chills, fatigue and fever. Negative for appetite change and diaphoresis.  HENT:  Positive for congestion and rhinorrhea.   Eyes: Negative for visual disturbance.  Respiratory: Positive for cough. Negative for chest tightness, shortness of breath and wheezing.   Cardiovascular: Negative for chest pain.  Gastrointestinal: Negative for abdominal pain, constipation, diarrhea, nausea and vomiting.  Genitourinary: Negative for dysuria and flank pain.  Musculoskeletal: Positive for myalgias. Negative for back pain and neck stiffness.  Skin: Negative for rash.  Neurological: Negative for light-headedness, numbness and headaches.  Psychiatric/Behavioral: Negative for agitation.  All other systems reviewed and are negative.    Physical Exam Updated Vital Signs BP 144/72 (BP Location: Right Arm)   Pulse 83   Temp 98.3 F (36.8 C) (Oral)   Resp 18   Ht 6\' 4"  (1.93 m)   Wt 288 lb (130.6 kg)   SpO2 100%   BMI 35.06 kg/m   Physical Exam  Constitutional: She is oriented to person, place, and time. She appears well-developed and well-nourished. No distress.  HENT:  Head: Normocephalic and atraumatic.  Nose: Rhinorrhea present.  Eyes: Conjunctivae are normal. Pupils are equal, round, and reactive to light.  Neck: Neck supple.  Cardiovascular: Normal rate and regular rhythm.   No murmur heard. Pulmonary/Chest: Effort normal and breath sounds normal. No respiratory distress. She has no wheezes. She has no rales. She exhibits no tenderness.  Abdominal: Soft. There is no tenderness.  Musculoskeletal: She exhibits no edema or tenderness.  Neurological: She is alert and oriented to person, place, and time. No cranial nerve deficit or sensory deficit. She exhibits normal muscle tone.  Skin: Skin is warm and dry. No rash noted. She is not diaphoretic. No erythema.  Psychiatric: She has a normal mood and affect.  Nursing [name] and vitals reviewed.    ED Treatments / Results  Labs (all labs ordered are listed, but only abnormal results are displayed) Labs Reviewed  CBC WITH  DIFFERENTIAL/PLATELET - Abnormal; Notable for the following:       Result Value   Platelets 409 (*)    All other components within normal limits  COMPREHENSIVE METABOLIC PANEL - Abnormal; Notable for the following:    Sodium 134 (*)    Chloride 99 (*)    Glucose, Bld 346 (*)    Total Protein 8.3 (*)    All other components within normal limits  URINALYSIS, ROUTINE W REFLEX MICROSCOPIC - Abnormal; Notable for the following:    Specific Gravity, Urine 1.036 (*)    Glucose, UA >=500 (*)    All other components within normal limits  URINALYSIS, MICROSCOPIC (REFLEX) - Abnormal; Notable for the following:  Bacteria, UA RARE (*)    Squamous Epithelial / LPF 0-5 (*)    All other components within normal limits  URINE CULTURE  LIPASE, BLOOD  I-STAT CG4 LACTIC ACID, ED    EKG  EKG Interpretation None       Radiology Dg Chest 2 View  Result Date: 12/16/2016 CLINICAL DATA:  Chills.  Cough. EXAM: CHEST  2 VIEW COMPARISON:  Report 10/25/2003 . FINDINGS: Mediastinum hilar structures normal. Low lung volumes with mild basilar atelectasis. Mild right base infiltrate cannot be completely excluded. No pleural effusion or pneumothorax. Heart size normal. No acute bony abnormality . IMPRESSION: Low lung volumes with mild basilar atelectasis. Mild right base infiltrate cannot be completely excluded. Electronically Signed   By: Marcello Moores  Register   On: 12/16/2016 12:23    Procedures Procedures (including critical care time)  Medications Ordered in ED Medications  sodium chloride 0.9 % bolus 1,000 mL (0 mLs Intravenous Stopped 12/16/16 1350)  ondansetron (ZOFRAN) injection 4 mg (4 mg Intravenous Given 12/16/16 1235)     Initial Impression / Assessment and Plan / ED Course  I have reviewed the triage vital signs and the nursing notes.  Pertinent labs & imaging results that were available during my care of the patient were reviewed by me and considered in my medical decision making (see chart  for details).     Tina [name] is a 46 y.o. female With the past medical history significant for diabetes, hypertension, and hyperlipidemia who presents with fatigue, chills, rhinorrhea, congestion, and nonproductive cough. Patient reports that she was at a birthday party for a child three days ago and since that time, has been feeling sick. Patient says that she has had a dry cough and the congestion. She also reports some nausea and one episode of vomiting. She says that she has had decreased oral intake since that time. She denies any chest pain, palpitations, shortness of breath. She denies any recent traumas. She denies other symptoms.  Based on complaints, suspect a viral URI however, with cough and chills, x-ray and labs will be obtained.  Chest x-ray shows concern for pneumonia. Laboratory testing grossly unremarkable aside from elevated glucose. Suspect this is elevated given her diabetes in the setting of infection. Patient felt much better after fluids and Zofran.   Patient will be given a prescription for azithromycin to treat pneumonia. Patient will follow up with PCP in the next several days for further management. Patient understood return precautions for any new or worsening symptoms. Patient had no other questions, concerns, or complaints and she was discharged in good condition with improvement in symptoms.      Final Clinical Impressions(s) / ED Diagnoses   Final diagnoses:  Community acquired pneumonia, unspecified laterality    New Prescriptions Discharge Medication List as of 12/16/2016  3:32 PM    START taking these medications   Details  azithromycin (ZITHROMAX) 250 MG tablet Take 1 tablet (250 mg total) by mouth daily. Take first 2 tablets together, then 1 every day until finished., Starting Tue 12/16/2016, Print    ondansetron (ZOFRAN) 4 MG tablet Take 1 tablet (4 mg total) by mouth every 8 (eight) hours as needed., Starting Tue 12/16/2016, Print        Clinical Impression: 1. Community acquired pneumonia, unspecified laterality     Disposition: Discharge  Condition: Good  I have discussed the results, Dx and Tx plan with the pt(& family if present). He/she/they expressed understanding and agree(s) with the plan. Discharge  instructions discussed at great length. Strict return precautions discussed and pt &/or family have verbalized understanding of the instructions. No further questions at time of discharge.    Discharge Medication List as of 12/16/2016  3:32 PM    START taking these medications   Details  azithromycin (ZITHROMAX) 250 MG tablet Take 1 tablet (250 mg total) by mouth daily. Take first 2 tablets together, then 1 every day until finished., Starting Tue 12/16/2016, Print    ondansetron (ZOFRAN) 4 MG tablet Take 1 tablet (4 mg total) by mouth every 8 (eight) hours as needed., Starting Tue 12/16/2016, Print        Follow Up: [facility] [id number] [id number] [place] [phone] [place], MD 12/16/16 2135

## 2016-12-17 LAB — URINE CULTURE: Culture: NO GROWTH

## 2016-12-29 ENCOUNTER — Encounter (INDEPENDENT_AMBULATORY_CARE_PROVIDER_SITE_OTHER): Payer: Self-pay

## 2016-12-29 ENCOUNTER — Ambulatory Visit (INDEPENDENT_AMBULATORY_CARE_PROVIDER_SITE_OTHER): Payer: BLUE CROSS/BLUE SHIELD | Admitting: Pulmonary Disease

## 2016-12-29 VITALS — BP 132/72 | HR 82 | Temp 98.0°F | Wt 269.4 lb

## 2016-12-29 DIAGNOSIS — J189 Pneumonia, unspecified organism: Secondary | ICD-10-CM | POA: Insufficient documentation

## 2016-12-29 DIAGNOSIS — M25512 Pain in left shoulder: Secondary | ICD-10-CM

## 2016-12-29 DIAGNOSIS — F17210 Nicotine dependence, cigarettes, uncomplicated: Secondary | ICD-10-CM | POA: Diagnosis not present

## 2016-12-29 DIAGNOSIS — Z09 Encounter for follow-up examination after completed treatment for conditions other than malignant neoplasm: Secondary | ICD-10-CM | POA: Diagnosis not present

## 2016-12-29 DIAGNOSIS — E119 Type 2 diabetes mellitus without complications: Secondary | ICD-10-CM

## 2016-12-29 DIAGNOSIS — Z8701 Personal history of pneumonia (recurrent): Secondary | ICD-10-CM | POA: Diagnosis not present

## 2016-12-29 DIAGNOSIS — R232 Flushing: Secondary | ICD-10-CM

## 2016-12-29 DIAGNOSIS — J181 Lobar pneumonia, unspecified organism: Principal | ICD-10-CM

## 2016-12-29 DIAGNOSIS — T148XXA Other injury of unspecified body region, initial encounter: Secondary | ICD-10-CM

## 2016-12-29 MED ORDER — CYCLOBENZAPRINE HCL 5 MG PO TABS: 5.0000 mg | ORAL_TABLET | Freq: Three times a day (TID) | ORAL | 0 refills | Status: DC | PRN

## 2016-12-29 MED ORDER — GLIPIZIDE 5 MG PO TABS: 5.0000 mg | ORAL_TABLET | Freq: Two times a day (BID) | ORAL | 3 refills | Status: DC

## 2016-12-29 MED ORDER — IBUPROFEN 600 MG PO TABS: 600.0000 mg | ORAL_TABLET | Freq: Four times a day (QID) | ORAL | 0 refills | Status: DC | PRN

## 2016-12-29 MED ORDER — METFORMIN HCL 1000 MG PO TABS: 1000.0000 mg | ORAL_TABLET | Freq: Two times a day (BID) | ORAL | 11 refills | Status: DC

## 2016-12-29 NOTE — Progress Notes (Signed)
   CC: pneumonia follow up  HPI:  [name] is a 46 y.o. woman with history as noted below here for pneumonia follow up.  CAP: She finished her antibiotics. She is feeling better from the pneumonia. She has hot flashes but no subjective fevers. A little cough - productive of clear sputum, was previously yellow.   Shoulder pain: left side, started this morning. No heavy lifting this weekend. She has been sleeping on the couch. She sleeps on the left side. She has not taken any medication for it. Constant. Sharp in quality. No radiation.  [] repeat CXR in 6 weeks  Past Medical History:  Diagnosis Date  . Diabetes mellitus    type II  . Fibroids   . HTN (hypertension)   . Hyperlipidemia   . Iron deficiency anemia   . Left acetabular fracture ([place])   . Lumbar strain   . Obesity   . Ovarian cyst     Review of Systems:   No chest pain No dyspnea  Physical Exam:  Vitals:   12/29/16 1122  BP: 132/72  Pulse: 82  Temp: 98 F (36.7 C)  TempSrc: Oral  SpO2: 100%  Weight: 269 lb 6.4 oz (122.2 kg)   General Apperance: NAD HEENT: Normocephalic, atraumatic, anicteric sclera Neck: Supple, trachea midline Lungs: Clear to auscultation bilaterally. No wheezes, rhonchi or rales. Breathing comfortably Heart: Regular rate and rhythm, no murmur/rub/gallop Abdomen: Soft, nontender, nondistended, no rebound/guarding Extremities: Warm and well perfused, no edema, tender to palpation along posterior left shoulder, negative empty can and cross arm tests, active ROM full in all direction. Negative spurlings. Skin: No rashes or lesions Neurologic: Alert and interactive. No gross deficits.  Assessment & Plan:   See Encounters Tab for problem based charting.  Patient discussed with Dr. Angelia Mould

## 2016-12-29 NOTE — Assessment & Plan Note (Signed)
Due for follow up. Refilled metformin and glipizide. Asked patient to come back in 3-4 weeks.

## 2016-12-29 NOTE — Assessment & Plan Note (Signed)
Assessment: Possible infiltrate of right base. Completed azithromycin. Feeling better. Cough improving.  Plan: CXR 6 weeks post previous CXR to follow up on possible infiltrate

## 2016-12-29 NOTE — Patient Instructions (Addendum)
Take ibuprofen every 6 hours for 7-10 days. You may take the flexeril up to three times a day as needed for muscle spasm.  You may use muscle rubs, ice, and/or heat as needed  Follow up in 3-4 weeks.

## 2016-12-29 NOTE — Assessment & Plan Note (Signed)
Assessment: Left posterior shoulder pain with negative empty can and cross arm test. Tender to palpation. Recently has been sleeping on couch on left side. Probably muscle strain from her new sleeping position.  Plan: Ibuprofen 600mg  q6hr for 7-10 days Flexeril 6mg  TID prn muscle spasm Ice/heat

## 2016-12-30 NOTE — Progress Notes (Signed)
Internal Medicine Clinic Attending  Case discussed with Dr. Krall at the time of the visit.  We reviewed the resident's history and exam and pertinent patient test results.  I agree with the assessment, diagnosis, and plan of care documented in the resident's note.  

## 2017-02-02 ENCOUNTER — Ambulatory Visit (INDEPENDENT_AMBULATORY_CARE_PROVIDER_SITE_OTHER): Payer: BLUE CROSS/BLUE SHIELD | Admitting: *Deleted

## 2017-02-02 ENCOUNTER — Encounter: Payer: Self-pay | Admitting: General Practice

## 2017-02-02 DIAGNOSIS — Z3042 Encounter for surveillance of injectable contraceptive: Secondary | ICD-10-CM | POA: Diagnosis not present

## 2017-02-02 NOTE — Progress Notes (Signed)
Patient presents for depo provera injection, tolerated well.

## 2017-02-05 ENCOUNTER — Encounter: Payer: Self-pay | Admitting: Podiatry

## 2017-02-05 ENCOUNTER — Ambulatory Visit (INDEPENDENT_AMBULATORY_CARE_PROVIDER_SITE_OTHER): Payer: BLUE CROSS/BLUE SHIELD | Admitting: Podiatry

## 2017-02-05 ENCOUNTER — Ambulatory Visit (INDEPENDENT_AMBULATORY_CARE_PROVIDER_SITE_OTHER): Payer: BLUE CROSS/BLUE SHIELD

## 2017-02-05 DIAGNOSIS — L97521 Non-pressure chronic ulcer of other part of left foot limited to breakdown of skin: Secondary | ICD-10-CM

## 2017-02-05 DIAGNOSIS — M2141 Flat foot [pes planus] (acquired), right foot: Secondary | ICD-10-CM

## 2017-02-05 DIAGNOSIS — G629 Polyneuropathy, unspecified: Secondary | ICD-10-CM

## 2017-02-05 DIAGNOSIS — M2010 Hallux valgus (acquired), unspecified foot: Secondary | ICD-10-CM

## 2017-02-05 DIAGNOSIS — M79672 Pain in left foot: Secondary | ICD-10-CM

## 2017-02-05 DIAGNOSIS — M2142 Flat foot [pes planus] (acquired), left foot: Secondary | ICD-10-CM

## 2017-02-05 DIAGNOSIS — M19071 Primary osteoarthritis, right ankle and foot: Secondary | ICD-10-CM

## 2017-02-05 DIAGNOSIS — L84 Corns and callosities: Secondary | ICD-10-CM

## 2017-02-05 MED ORDER — PREGABALIN 75 MG PO CAPS
75.0000 mg | ORAL_CAPSULE | Freq: Two times a day (BID) | ORAL | 0 refills | Status: DC
Start: 2017-02-05 — End: 2018-04-07

## 2017-02-05 NOTE — Patient Instructions (Signed)
Pregabalin capsules What is this medicine? PREGABALIN (pre GAB a lin) is used to treat nerve pain from diabetes, shingles, spinal cord injury, and fibromyalgia. It is also used to control seizures in epilepsy. This medicine may be used for other purposes; ask your health care provider or pharmacist if you have questions. COMMON BRAND NAME(S): Lyrica What should I tell my health care provider before I take this medicine? They need to know if you have any of these conditions: -bleeding problems -heart disease, including heart failure -history of alcohol or drug abuse -kidney disease -suicidal thoughts, plans, or attempt; a previous suicide attempt by you or a family member -an unusual or allergic reaction to pregabalin, gabapentin, other medicines, foods, dyes, or preservatives -pregnant or trying to get pregnant or trying to conceive with your partner -breast-feeding How should I use this medicine? Take this medicine by mouth with a glass of water. Follow the directions on the prescription label. You can take this medicine with or without food. Take your doses at regular intervals. Do not take your medicine more often than directed. Do not stop taking except on your doctor's advice. A special MedGuide will be given to you by the pharmacist with each prescription and refill. Be sure to read this information carefully each time. Talk to your pediatrician regarding the use of this medicine in children. Special care may be needed. Overdosage: If you think you have taken too much of this medicine contact a poison control center or emergency room at once. NOTE: This medicine is only for you. Do not share this medicine with others. What if I miss a dose? If you miss a dose, take it as soon as you can. If it is almost time for your next dose, take only that dose. Do not take double or extra doses. What may interact with this medicine? -alcohol -certain medicines for blood pressure like captopril,  enalapril, or lisinopril -certain medicines for diabetes, like pioglitazone or rosiglitazone -certain medicines for anxiety or sleep -narcotic medicines for pain This list may not describe all possible interactions. Give your health care provider a list of all the medicines, herbs, non-prescription drugs, or dietary supplements you use. Also tell them if you smoke, drink alcohol, or use illegal drugs. Some items may interact with your medicine. What should I watch for while using this medicine? Tell your doctor or healthcare professional if your symptoms do not start to get better or if they get worse. Visit your doctor or health care professional for regular checks on your progress. Do not stop taking except on your doctor's advice. You may develop a severe reaction. Your doctor will tell you how much medicine to take. Wear a medical identification bracelet or chain if you are taking this medicine for seizures, and carry a card that describes your disease and details of your medicine and dosage times. You may get drowsy or dizzy. Do not drive, use machinery, or do anything that needs mental alertness until you know how this medicine affects you. Do not stand or sit up quickly, especially if you are an older patient. This reduces the risk of dizzy or fainting spells. Alcohol may interfere with the effect of this medicine. Avoid alcoholic drinks. If you have a heart condition, like congestive heart failure, and notice that you are retaining water and have swelling in your hands or feet, contact your health care provider immediately. The use of this medicine may increase the chance of suicidal thoughts or actions. Pay special attention   to how you are responding while on this medicine. Any worsening of mood, or thoughts of suicide or dying should be reported to your health care professional right away. This medicine has caused reduced sperm counts in some men. This may interfere with the ability to father a  child. You should talk to your doctor or health care professional if you are concerned about your fertility. Women who become pregnant while using this medicine for seizures may enroll in the [place] Antiepileptic Drug Pregnancy Registry by calling 1-888-233-2334. This registry collects information about the safety of antiepileptic drug use during pregnancy. What side effects may I notice from receiving this medicine? Side effects that you should report to your doctor or health care professional as soon as possible: -allergic reactions like skin rash, itching or hives, swelling of the face, lips, or tongue -breathing problems -changes in vision -chest pain -confusion -jerking or unusual movements of any part of your body -loss of memory -muscle pain, tenderness, or weakness -suicidal thoughts or other mood changes -swelling of the ankles, feet, hands -unusual bruising or bleeding Side effects that usually do not require medical attention (report to your doctor or health care professional if they continue or are bothersome): -dizziness -drowsiness -dry mouth -headache -nausea -tremors -trouble sleeping -weight gain This list may not describe all possible side effects. Call your doctor for medical advice about side effects. You may report side effects to FDA at 1-800-FDA-1088. Where should I keep my medicine? Keep out of the reach of children. This medicine can be abused. Keep your medicine in a safe place to protect it from theft. Do not share this medicine with anyone. Selling or giving away this medicine is dangerous and against the law. This medicine may cause accidental overdose and death if it taken by other adults, children, or pets. Mix any unused medicine with a substance like cat litter or coffee grounds. Then throw the medicine away in a sealed container like a sealed bag or a coffee can with a lid. Do not use the medicine after the expiration date. Store at room  temperature between 15 and 30 degrees C (59 and 86 degrees F). NOTE: This sheet is a summary. It may not cover all possible information. If you have questions about this medicine, talk to your doctor, pharmacist, or health care provider.  2018 Elsevier/Gold Standard (2015-11-22 10:26:12)  

## 2017-02-06 ENCOUNTER — Encounter: Payer: Self-pay | Admitting: Internal Medicine

## 2017-02-06 ENCOUNTER — Ambulatory Visit (INDEPENDENT_AMBULATORY_CARE_PROVIDER_SITE_OTHER): Payer: BLUE CROSS/BLUE SHIELD | Admitting: Internal Medicine

## 2017-02-06 VITALS — BP 136/70 | HR 87 | Temp 98.3°F | Ht 76.0 in | Wt 266.9 lb

## 2017-02-06 DIAGNOSIS — J181 Lobar pneumonia, unspecified organism: Secondary | ICD-10-CM

## 2017-02-06 DIAGNOSIS — M79671 Pain in right foot: Secondary | ICD-10-CM

## 2017-02-06 DIAGNOSIS — E1165 Type 2 diabetes mellitus with hyperglycemia: Secondary | ICD-10-CM | POA: Diagnosis not present

## 2017-02-06 DIAGNOSIS — E669 Obesity, unspecified: Secondary | ICD-10-CM

## 2017-02-06 DIAGNOSIS — R05 Cough: Secondary | ICD-10-CM

## 2017-02-06 DIAGNOSIS — G8929 Other chronic pain: Secondary | ICD-10-CM | POA: Diagnosis not present

## 2017-02-06 DIAGNOSIS — J309 Allergic rhinitis, unspecified: Secondary | ICD-10-CM | POA: Diagnosis not present

## 2017-02-06 DIAGNOSIS — M79672 Pain in left foot: Secondary | ICD-10-CM

## 2017-02-06 DIAGNOSIS — Z8701 Personal history of pneumonia (recurrent): Secondary | ICD-10-CM

## 2017-02-06 DIAGNOSIS — L84 Corns and callosities: Secondary | ICD-10-CM

## 2017-02-06 DIAGNOSIS — Z6832 Body mass index (BMI) 32.0-32.9, adult: Secondary | ICD-10-CM

## 2017-02-06 DIAGNOSIS — E11621 Type 2 diabetes mellitus with foot ulcer: Secondary | ICD-10-CM | POA: Diagnosis not present

## 2017-02-06 DIAGNOSIS — F17210 Nicotine dependence, cigarettes, uncomplicated: Secondary | ICD-10-CM | POA: Diagnosis not present

## 2017-02-06 DIAGNOSIS — Z7984 Long term (current) use of oral hypoglycemic drugs: Secondary | ICD-10-CM | POA: Diagnosis not present

## 2017-02-06 DIAGNOSIS — M19071 Primary osteoarthritis, right ankle and foot: Secondary | ICD-10-CM | POA: Diagnosis not present

## 2017-02-06 DIAGNOSIS — L97509 Non-pressure chronic ulcer of other part of unspecified foot with unspecified severity: Secondary | ICD-10-CM | POA: Diagnosis not present

## 2017-02-06 DIAGNOSIS — J189 Pneumonia, unspecified organism: Secondary | ICD-10-CM

## 2017-02-06 DIAGNOSIS — J302 Other seasonal allergic rhinitis: Secondary | ICD-10-CM

## 2017-02-06 DIAGNOSIS — E119 Type 2 diabetes mellitus without complications: Secondary | ICD-10-CM

## 2017-02-06 LAB — GLUCOSE, CAPILLARY: Glucose-Capillary: 411 mg/dL — ABNORMAL HIGH (ref 65–99)

## 2017-02-06 LAB — POCT GLYCOSYLATED HEMOGLOBIN (HGB A1C): Hemoglobin A1C: >14

## 2017-02-06 MED ORDER — ACCU-CHEK FASTCLIX LANCETS MISC: 11 refills | Status: DC

## 2017-02-06 MED ORDER — IBUPROFEN 600 MG PO TABS: 600.0000 mg | ORAL_TABLET | Freq: Four times a day (QID) | ORAL | 0 refills | Status: DC | PRN

## 2017-02-06 MED ORDER — ACCU-CHEK GUIDE W/DEVICE KIT: 1.0000 | PACK | Freq: Two times a day (BID) | 0 refills | Status: DC

## 2017-02-06 MED ORDER — ACCU-CHEK FASTCLIX LANCETS MISC
11 refills | Status: DC
Start: 2017-02-06 — End: 2018-01-25

## 2017-02-06 MED ORDER — LORATADINE 10 MG PO TABS: 10.0000 mg | ORAL_TABLET | Freq: Every day | ORAL | 3 refills | Status: DC

## 2017-02-06 MED ORDER — GLUCOSE BLOOD VI STRP: ORAL_STRIP | 12 refills | Status: DC

## 2017-02-06 MED ORDER — FLUTICASONE PROPIONATE 50 MCG/ACT NA SUSP
1.0000 | Freq: Every day | NASAL | 3 refills | Status: DC
Start: 2017-02-06 — End: 2018-10-10

## 2017-02-06 MED ORDER — INSULIN DETEMIR 100 UNIT/ML ~~LOC~~ SOLN: 20.0000 [IU] | Freq: Every day | SUBCUTANEOUS | 11 refills | Status: DC

## 2017-02-06 NOTE — Progress Notes (Signed)
   CC: Diabetes follow up  HPI:  [name] is a 46 y.o. female with PMHx detailed below presenting for follow up of her diabetes. Her blood sugar remains very poorly controlled and she has been experiencing urinary frequency, fatigue, malaise, headaches, and worsening of her chronic bilateral foot pain.  See problem based assessment and plan below for additional details.  Past Medical History:  Diagnosis Date  . Diabetes mellitus    type II  . Fibroids   . HTN (hypertension)   . Hyperlipidemia   . Iron deficiency anemia   . Left acetabular fracture ([place])   . Lumbar strain   . Obesity   . Ovarian cyst     Review of Systems: Review of Systems  Constitutional: Positive for malaise/fatigue and weight loss. Negative for chills and fever.  Respiratory: Negative for cough, sputum production and shortness of breath.   Cardiovascular: Negative for chest pain, palpitations and claudication.  Gastrointestinal: Negative for abdominal pain, constipation, diarrhea, nausea and vomiting.  Genitourinary: Positive for frequency. Negative for dysuria.  Musculoskeletal: Positive for joint pain.  All other systems reviewed and are negative.    Physical Exam: Vitals:   02/06/17 1609  BP: 136/70  Pulse: 87  Temp: 98.3 F (36.8 C)  TempSrc: Oral  SpO2: 100%  Weight: 266 lb 14.4 oz (121.1 kg)  Height: 6\' 4"  (1.93 m)   Body mass index is 32.49 kg/m. GENERAL- Obese woman sitting in exam room chair, alert, mild distress HEENT- Atraumatic, moist mucous membranes, sinuses nontender, no cervical lymphadenopathy CARDIAC- Regular rate and rhythm, no murmurs, rubs or gallops. RESP- Clear to ascultation bilaterally, no wheezing or crackles, normal work of breathing ABDOMEN- Obese, soft, nontender, nondistended EXTREMITIES- 1+ edema to mid calves bilaterally, ankles tender to palpation bilaterally, poor range of motion, foot wrapped SKIN- Warm, dry, intact PSYCH- Tearful  affect  Assessment & Plan:   See encounters tab for problem based medical decision making.  Patient discussed with Dr. Angelia [name]

## 2017-02-06 NOTE — Assessment & Plan Note (Signed)
Reports chronic congestion and post-nasal drip, has had relief with loratadine in the past.  Plan: -- Provided Rx for Claritin and Flonase

## 2017-02-06 NOTE — Assessment & Plan Note (Addendum)
Continues to experience significant pain and impaired mobility from foot pain. Also experiencing dry, cracking, skin and recently had a wound debrided by her podiatrist. Also prescribed Lyrica to treat her chronic bilateral foot pain. Uncontrolled diabetes likely worsening her pain and puts her at risk for foot infections and diabetic neuropathy  Plan: -- Advised to continue follow up and management with Dr. Jacqualyn Posey -- Optimize diabetic control -- Provided refill for Ibuprofen 600 mg TID PRN

## 2017-02-06 NOTE — Assessment & Plan Note (Signed)
Reports cough has improved, only mild, intermittent, dry currently. No fevers, chills, or dyspnea. Clinically resolved. Focused visit on management of diabetes, can consider follow up CXR at future visit.

## 2017-02-06 NOTE — Assessment & Plan Note (Signed)
A1c > 14 today from 11.4 on Oct 2017 despite compliance with Metformin 1000 BID and Glipizide 5 mg BID. She reports urinary frequency, fatigue, malaise, and headaches. She reports significant stress from her chronic bilateral foot pain and stress from work and taking care of her mother. She endorses poor diet and consumes carb heavy foods often. She also reports a recent foot callus/ulcer that required debridement by her podiatrist and showed signs of early infection. She states she has used insulin the past and is willing to start again.  Plan: -- Start Levemir 20 units QHS, advised to follow AM CBGs and increase dose by 1 unit every three days if BG remains above 140-180.  -- Provided AccuChek glucometer, insulin needles, and test strips - advised to check sugar twice daily -- Continue Metformin and Glipizide as previous -- Follow up in 1 month

## 2017-02-06 NOTE — Patient Instructions (Addendum)
Please start taking Levemir insulin 20 units at night.  Please check your blood sugar first thing in the morning and once in the evening each day. Or any time you feel that your blood sugar feels low.   Check you blood sugar in the morning and if your sugars are running higher than 140-180 consistently you can start increasing the Levemir by 1 unit, every three days.   We have also provided prescriptions for Flonase and Claritin for allergies.  Please continue seeing your foot doctor.  Please return in 1 month  If you have any questions or concerns, please call the clinic at [phone] or if it is the weekend or after hours, you may call [phone] and ask for the internal medicine resident on call. Call and say that you need to Dr. Asencion Partridge, who said to contact him.

## 2017-02-09 NOTE — Progress Notes (Signed)
Subjective: 46 year old female presents the office today for concerns of pain to the outside aspect of the right ankle which is been ongoing for several years as well as for pain to the callus on the left second toe on the medial border which is been ongoing for quite some time. She states that these issues are painful with pressure in shoes. She has difficulty working because of pain to the toe on the left side as well as the ankle. 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 She denies any recent injury or trauma. She also gets sharp burning pains to her feet as well. Denies any systemic complaints such as fevers, chills, nausea, vomiting. No acute changes since last appointment, and no other complaints at this time.   Objective: AAO x3, NAD DP/PT pulses palpable bilaterally, CRT less than 3 seconds Mild decrease in symptoms onto monofilament. Significant flatfoot is present bilaterally. 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 There is tenderness to lateral aspect of the ankle on the right side and there is crepitation with range of motion. There is no specific area pinpoint bony tenderness or pain vibratory sensation.  On the left side there is a thick hyperkeratotic lesion on the medial aspect of the second toe. Upon debridement of superficial wound present. There is no probing, undermining or tunneling. No surrounding erythema, ascending cellulitis, fluctuance, crepitus, malodor.  No open lesions or pre-ulcerative lesions.  No pain with calf compression, swelling, warmth, erythema  Assessment: Left ankle arthritis, wound left second toe; neuropathy.  Plan: -All treatment options discussed with the patient including all alternatives, risks, complications.  -X-rays were obtained and reviewed. No evidence of acute fracture identified. Arthritic changes are present in the right ankle joint. -I debrided hyperkeratotic lesion left second toe. Recommended interbody ointment dressing changes daily.  Offloading pads were dispensed. -For the right ankle I had are also evaluated today by [name] and he is to check on insurance carrier tried [place] brace as well as a therapeutic shoe. We'll await insurance coverage and have her come back to see [name] for casting. -Will start Lyrica on a trial basis the next week to see how this does. A coupon was provided for her for free trial. If she gets relief with this we will continue this medication. Discussed side effects. -Patient encouraged to call the office with any questions, concerns, change in symptoms.   Celesta Gentile, DPM

## 2017-02-10 NOTE — Progress Notes (Signed)
Internal Medicine Clinic Attending  Case discussed with Dr. Johnson at the time of the visit.  We reviewed the resident's history and exam and pertinent patient test results.  I agree with the assessment, diagnosis, and plan of care documented in the resident's note.  

## 2017-02-19 ENCOUNTER — Ambulatory Visit (INDEPENDENT_AMBULATORY_CARE_PROVIDER_SITE_OTHER): Payer: BLUE CROSS/BLUE SHIELD | Admitting: Podiatry

## 2017-02-19 DIAGNOSIS — E1149 Type 2 diabetes mellitus with other diabetic neurological complication: Secondary | ICD-10-CM

## 2017-02-19 DIAGNOSIS — M2141 Flat foot [pes planus] (acquired), right foot: Secondary | ICD-10-CM | POA: Diagnosis not present

## 2017-02-19 DIAGNOSIS — M2010 Hallux valgus (acquired), unspecified foot: Secondary | ICD-10-CM | POA: Diagnosis not present

## 2017-02-19 DIAGNOSIS — L84 Corns and callosities: Secondary | ICD-10-CM | POA: Diagnosis not present

## 2017-02-19 DIAGNOSIS — M722 Plantar fascial fibromatosis: Secondary | ICD-10-CM | POA: Diagnosis not present

## 2017-02-19 DIAGNOSIS — M2142 Flat foot [pes planus] (acquired), left foot: Secondary | ICD-10-CM | POA: Diagnosis not present

## 2017-02-19 DIAGNOSIS — M19071 Primary osteoarthritis, right ankle and foot: Secondary | ICD-10-CM

## 2017-02-19 NOTE — Progress Notes (Signed)
Subjective: 46 year old female presents the office today for follow-up evaluation of left foot pain worse than right. She gets ankle pains on the right side which is awaiting brace casting. On the left side she's doing pain in the second toe as well as of the heel. She describes as a sharp pain of the heel at times as well as throbbing pain in the second toe. She tried Lyrica and this did not help. Her blood sugars of also been elevated as she doesn't have insurance for month and she is not taking any insulin for the diabetes complications. Denies any systemic complaints such as fevers, chills, nausea, vomiting. No acute changes since last appointment, and no other complaints at this time.   Objective: AAO x3, NAD DP/PT pulses palpable bilaterally, CRT less than 3 seconds Mild decrease in symptoms onto monofilament. Significant flatfoot is present bilaterally. 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 There is tenderness to lateral aspect of the ankle on the right side and there is crepitation with range of motion. There is no specific area pinpoint bony tenderness or pain vibratory sensation.  On the left side there is a hyperkeratotic lesion on the medial aspect of the second toe. Upon debridement of the ulceration appears to be healed. There is no probing, undermining or tunneling. No surrounding erythema, ascending cellulitis, fluctuance, crepitus, malodor.  Thick hyperkeratotic lesion left plantar heel. No ulceration, drainage or ascending infection. There is also tenderness which appears to be on the course of plantar fascial heel which is likely causing some of her pain. Is no pain with lateral compression of the calcaneus there is no other areas tenderness bilaterally. No open lesions or pre-ulcerative lesions.  No pain with calf compression, swelling, warmth, erythema  Assessment: Left ankle arthritis, pre-ulcerative callus left second toe, plantar heel with possible plantar fasciitis  Plan: -All  treatment options discussed with the patient including all alternatives, risks, complications.  -Patient elects to proceed with steroid injection into the left heel. Under sterile skin preparation, a total of 2.5cc of kenalog 10, 0.5% Marcaine plain, and 2% lidocaine plain were infiltrated into the symptomatic area without complication. A band-aid was applied. Patient tolerated the injection well without complication. Post-injection care with discussed with the patient. Discussed with the patient to ice the area over the next couple of days to help prevent a steroid flare.  -Hyperkeratotic lesions were sharply debrided without complications or bleeding. -Awaiting inserts, [place] brace. She was seen by [name] today for casting.  -RTC 3 weeks or sooner if needed.   Celesta Gentile, DPM

## 2017-03-12 ENCOUNTER — Encounter: Payer: Self-pay | Admitting: Podiatry

## 2017-03-12 ENCOUNTER — Ambulatory Visit (INDEPENDENT_AMBULATORY_CARE_PROVIDER_SITE_OTHER): Payer: BLUE CROSS/BLUE SHIELD | Admitting: Podiatry

## 2017-03-12 DIAGNOSIS — L84 Corns and callosities: Secondary | ICD-10-CM

## 2017-03-12 DIAGNOSIS — L97511 Non-pressure chronic ulcer of other part of right foot limited to breakdown of skin: Secondary | ICD-10-CM

## 2017-03-12 DIAGNOSIS — M2142 Flat foot [pes planus] (acquired), left foot: Secondary | ICD-10-CM

## 2017-03-12 DIAGNOSIS — M2141 Flat foot [pes planus] (acquired), right foot: Secondary | ICD-10-CM | POA: Diagnosis not present

## 2017-03-12 DIAGNOSIS — M19071 Primary osteoarthritis, right ankle and foot: Secondary | ICD-10-CM | POA: Diagnosis not present

## 2017-03-12 DIAGNOSIS — M2010 Hallux valgus (acquired), unspecified foot: Secondary | ICD-10-CM

## 2017-03-12 DIAGNOSIS — M779 Enthesopathy, unspecified: Secondary | ICD-10-CM

## 2017-03-12 MED ORDER — CEPHALEXIN 500 MG PO CAPS: 500.0000 mg | ORAL_CAPSULE | Freq: Three times a day (TID) | ORAL | 2 refills | Status: DC

## 2017-03-12 MED ORDER — OXYCODONE-ACETAMINOPHEN 5-325 MG PO TABS: 1.0000 | ORAL_TABLET | Freq: Four times a day (QID) | ORAL | 0 refills | Status: DC | PRN

## 2017-03-12 MED ORDER — OXYCODONE-ACETAMINOPHEN 5-325 MG PO TABS: 1.0000 | ORAL_TABLET | ORAL | 0 refills | Status: DC | PRN

## 2017-03-19 NOTE — Progress Notes (Signed)
Subjective: 46 year old female presents the office today for follow-up evaluation of left foot pain worse than right. She states that she still gets pain most of the outside aspect of the right foot she also states he gets pain to the left second toe on the callus. She states that overall she still has a lot of pain to the left foot as well. She denies any drainage or pus coming from the wound. She also has a new wound on the outside aspect of the bilateral right foot on the area pain which started since last appointment. Denies any redness or warmth. Denies any systemic complaints such as fevers, chills, nausea, vomiting. No acute changes since last appointment, and no other complaints at this time.   Objective: AAO x3, NAD DP/PT pulses palpable bilaterally, CRT less than 3 seconds Mild decrease in symptoms onto monofilament. Significant flatfoot is present bilaterally. 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 There is tenderness to lateral aspect of the ankle on the right side and there is crepitation with range of motion. There is no specific area pinpoint bony tenderness or pain vibratory sensation.  There is a new and the lateral aspect of the right sinus tarsi with superficial breakdown is granular wound base. There is no swelling erythema, ascending cellulitis. No drainage or pus expressed. No malodor. On the left side there is a hyperkeratotic lesion on the medial aspect of the second toe. Upon debridement of the ulceration appears to be healed. There is no probing, undermining or tunneling. No surrounding erythema, ascending cellulitis, fluctuance, crepitus, malodor. There is diffuse tenderness on the left medial foot and ankle. This is on the course of posterior tibial tendon. Thick hyperkeratotic lesion left plantar heel. No ulceration, drainage or ascending infection. There is also tenderness which appears to be on the course of plantar fascial heel which is likely causing some of her pain. Is no pain with  lateral compression of the calcaneus there is no other areas tenderness bilaterally. No open lesions or pre-ulcerative lesions.  No pain with calf compression, swelling, warmth, erythema  Assessment: Left ankle arthritis, pre-ulcerative callus left second toe, left foot/ankle pain, rule out posterior tibial tendon tear.  Plan: -All treatment options discussed with the patient including all alternatives, risks, complications.  -At this time recommend MRI of the left foot/ankle to rule out tendon tear. Overall she is a very difficult foot type on both sides. Awaiting brace or shoes to help support her foot. We'll discuss with [name]. -New wound on the right lateral foot. Continue antibiotic ointment dressing changes and prescribed Keflex. -Monitor for any clinical signs or symptoms of infection and directed to call the office immediately should any occur or go to the ER.  Celesta Gentile, DPM

## 2017-03-20 ENCOUNTER — Telehealth: Payer: Self-pay | Admitting: *Deleted

## 2017-03-20 DIAGNOSIS — M069 Rheumatoid arthritis, unspecified: Secondary | ICD-10-CM

## 2017-03-20 DIAGNOSIS — T148XXA Other injury of unspecified body region, initial encounter: Secondary | ICD-10-CM

## 2017-03-20 NOTE — Telephone Encounter (Addendum)
-----   Message from [name], DPM sent at 03/19/2017  5:49 PM EDT ----- Can you please order an MRI of the left foot and ankle to rule out posterior tibial tendon tear and evaluate for arthritis. 03/20/2017-Gave orders to [facility] for [employer], faxed to [place].

## 2017-03-26 ENCOUNTER — Ambulatory Visit: Payer: BLUE CROSS/BLUE SHIELD | Admitting: Podiatry

## 2017-04-02 ENCOUNTER — Telehealth: Payer: Self-pay | Admitting: Internal Medicine

## 2017-04-02 ENCOUNTER — Other Ambulatory Visit: Payer: Self-pay | Admitting: *Deleted

## 2017-04-02 NOTE — Telephone Encounter (Signed)
Refill Request  Patient is out of medication   insulin detemir (LEVEMIR) 100 UNIT/ML injection

## 2017-04-02 NOTE — Telephone Encounter (Signed)
REFILL ON INSULIN AT [place], [place]

## 2017-04-02 NOTE — Telephone Encounter (Signed)
Pt has refills at [facility], called her, she states she prefers [facility] [address], she will have them transferred, removed all pharmacies except [place] from pt profile

## 2017-04-03 NOTE — Telephone Encounter (Addendum)
Call from pt-unable to afford levemir-with her insurance it's still $60.  Pt states that she is completely out-didn't have any last night.  Being that is Friday-I offered patient a levemir sample (vial) and informed her that I will speak with our pharmacist and nutritionist to see what options are available.  Pt will pick up sample today.Tina Hidden Cassady6/1/20183:46 PM

## 2017-04-13 ENCOUNTER — Encounter: Payer: Self-pay | Admitting: *Deleted

## 2017-04-19 ENCOUNTER — Other Ambulatory Visit: Payer: BLUE CROSS/BLUE SHIELD

## 2017-04-20 ENCOUNTER — Ambulatory Visit (INDEPENDENT_AMBULATORY_CARE_PROVIDER_SITE_OTHER): Payer: BLUE CROSS/BLUE SHIELD | Admitting: *Deleted

## 2017-04-20 VITALS — BP 131/63 | HR 96

## 2017-04-20 DIAGNOSIS — Z3042 Encounter for surveillance of injectable contraceptive: Secondary | ICD-10-CM

## 2017-04-20 MED ORDER — MEDROXYPROGESTERONE ACETATE 150 MG/ML IM SUSP
150.0000 mg | Freq: Once | INTRAMUSCULAR | Status: AC
  Administered 2017-04-20: 150 mg via INTRAMUSCULAR

## 2017-04-20 NOTE — Progress Notes (Signed)
Here for depo-provera. Discussed will need annual exam around time of next shot.

## 2017-04-22 ENCOUNTER — Other Ambulatory Visit: Payer: BLUE CROSS/BLUE SHIELD

## 2017-04-22 ENCOUNTER — Other Ambulatory Visit: Payer: Self-pay | Admitting: Internal Medicine

## 2017-04-22 DIAGNOSIS — I10 Essential (primary) hypertension: Secondary | ICD-10-CM

## 2017-05-04 ENCOUNTER — Ambulatory Visit (INDEPENDENT_AMBULATORY_CARE_PROVIDER_SITE_OTHER): Payer: BLUE CROSS/BLUE SHIELD | Admitting: Clinical

## 2017-05-04 DIAGNOSIS — F4323 Adjustment disorder with mixed anxiety and depressed mood: Secondary | ICD-10-CM

## 2017-05-04 NOTE — BH Specialist Note (Signed)
Integrated Behavioral Health Initial Visit  MRN: [id number] Name: Tina [name]   Session Start time: 3:50 Session End time: 4:50 Total time: 1 hour  Type of Service: [place] Interpretor:No. Interpretor Name and Language: n/a   Warm Hand Off Completed.       SUBJECTIVE: Tina [name] is a 46 y.o. female accompanied by patient. Patient was referred by Dr Roselie Awkward for depression. 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 Duration of problem: Increasing over time; Severity of problem: moderate  OBJECTIVE: Mood: Anxious and Depressed and Affect: Depressed and Tearful Risk of harm to self or others: No plan to harm self or others   LIFE CONTEXT: Family and Social: Lives by herself; visits 70yo mother daily; has 5 brothers, 2 sisters(1 sister passed away at 77yo) School/Work: Working fulltime at [employer] allowed to sit on stool, must stand for work, very painful with wounds of foot) Self-Care: Uses ginger/turmeric/tart cherry to help reduce inflammation Life Changes:   GOALS ADDRESSED: Patient will reduce symptoms of: anxiety, depression, stress and Chronic pain and increase knowledge and/or ability of: stress reduction and also: Increase healthy adjustment to current life circumstances   INTERVENTIONS: Mindfulness or Psychologist, educational, Psychoeducation and/or Health Education and Link to [employer]  Standardized Assessments completed: GAD-7 and PHQ 9  ASSESSMENT: Patient currently experiencing Adjustment disorder with mixed anxious and depressed mood. Patient may benefit from psychoeducation and brief therapeutic intervention regarding coping with symptoms of anxiety and depression.Marland Kitchen  PLAN: 1. Follow up with behavioral health clinician on : As needed 2. Behavioral recommendations:   -CALM relaxation breathing exercise daily -Read educational material regarding coping with symptoms of anxiety and depression and chronic pain -Consider Senior Resources provided in office visit, for help caring for mother -Continue to use ginger/turmeric/tart cherry, as long as it remains helpful with inflammation/pain  3. Referral(s): [facility] [employer] [facility] (In Clinic)  Caroleen Hamman [place], [place]  Depression screen [facility] 2/9 02/06/2017 11/17/2016 10/22/2016 08/29/2016 06/12/2016  Decreased Interest 0 2 0 0 1  Down, Depressed, Hopeless 0 3 0 0 3  PHQ - 2 Score 0 5 0 0 4  Altered sleeping - 0 - - 3  Tired, decreased energy - 2 - - 3  Change in appetite - 0 - - 1  Feeling bad or failure about yourself  - 0 - - 3  Trouble concentrating - 2 - - 1  Moving slowly or fidgety/restless - 0 - - 1  Suicidal thoughts - 0 - - 0  PHQ-9 Score - 9 - - 16  Some recent data might be hidden   GAD 7 : Generalized Anxiety Score 11/17/2016 06/12/2016  Nervous, Anxious, on Edge 2 2  Control/stop worrying 1 3  Worry too much - different things 3 3  Trouble relaxing 2 2  Restless 2 2  Easily annoyed or irritable 1 3  Afraid - awful might happen 2 1  Total GAD 7 Score 13 16

## 2017-05-05 ENCOUNTER — Ambulatory Visit
Admission: RE | Admit: 2017-05-05 | Discharge: 2017-05-05 | Disposition: A | Discharge status: Home / Self Care | Payer: BLUE CROSS/BLUE SHIELD | Source: Ambulatory Visit | Attending: Podiatry | Admitting: Podiatry

## 2017-05-07 ENCOUNTER — Telehealth: Payer: Self-pay | Admitting: Podiatry

## 2017-05-07 ENCOUNTER — Telehealth: Payer: Self-pay

## 2017-05-07 NOTE — Telephone Encounter (Signed)
Pt returned your call.  

## 2017-05-07 NOTE — Telephone Encounter (Signed)
LVM for patient to call to discuss MRI results and treatments recommended by Dr Jacqualyn Posey

## 2017-05-07 NOTE — Telephone Encounter (Signed)
Spoke wit patient regarding MRI results, she stated that someone was supposed to call her back in regards to insurance coverage for a custom brace, Message sent to [place] and Alyse Low C to contact patient in regards to insurance coverage for brace

## 2017-05-11 NOTE — Telephone Encounter (Signed)
Tina [name], can you please follow-up on this?

## 2017-05-13 ENCOUNTER — Telehealth: Payer: Self-pay

## 2017-05-13 NOTE — Telephone Encounter (Signed)
I did discuss the MRI with her, according to what Dr Jacqualyn Posey had told me that: MRI of the foot and ankle were mostly negative, other than some mild swelling and bursitis. I do think the majority of symptoms she has is due to her flatfoot. I informed her of this and sent the follow message below this to [name] in regards to her ankle brace.   Previous Messages    ----- Message -----  From: Roney Jaffe, RN  Sent: 05/07/2017  3:12 PM  To: Tina [name], [name]   This patient was told that she would be called inregards to what her insurance will cover for a custom brace, she did not receive a call.   [name], I know that you do precerts for this, can you please look into what her costs would be, you may need to consult with [name] about the type of brace but the patient and Dr Jacqualyn Posey stated that this would be the next step to help her with her foot pain.   [name] she has a h/o bilateral foot/ankle pain, Im sure you can look at [employer] notes as well. Thank you for help

## 2017-05-13 NOTE — Telephone Encounter (Signed)
Yes, I received a message from [place] concerning my MRI. I'm wanting to know if Dr. Jacqualyn Posey could prescribe me something because the percocet is not working. I'm in constant pain daily. If you could please call me back at [phone] I would appreciate it.

## 2017-05-13 NOTE — Telephone Encounter (Addendum)
-----   Message from Roney Jaffe, RN sent at 05/13/2017  4:29 PM EDT ----- I did discuss the MRI with her, according to what Dr Jacqualyn Posey had told me that: MRI of the foot and ankle were mostly negative, other than some mild swelling and bursitis. I do think the majority of symptoms she has is due to her flatfoot. I informed her of this and sent the follow message below this to [place] in regards to her ankle brace.  ----- Message ----- From: Roney Jaffe, RN Sent: 05/07/2017   3:12 PM To: Velora Heckler, [name]  This patient was told that she would be called inregards to what her insurance will cover for a custom brace, she did not receive a call.   [name], I know that you do precerts for this, can you please look into what her costs would be, you may need to consult with [name] about the type of brace but the patient and Dr Jacqualyn Posey stated that this would be the next step to help her with her foot pain.  [name] she has a h/o bilateral foot/ankle pain, Im sure you can look at [employer] notes as well. Thank you for help

## 2017-05-13 NOTE — Telephone Encounter (Deleted)
Phone call on  05/07/17:   MRI results discussed with patient, according to what Dr Jacqualyn Posey had told me that: MRI of the foot and ankle were mostly negative, other than some mild swelling and bursitis. I do think the majority of symptoms she has is due to her flatfoot. I informed her of this and sent a message to [place] in regards to her ankle brace.

## 2017-05-14 ENCOUNTER — Telehealth: Payer: Self-pay | Admitting: *Deleted

## 2017-05-14 DIAGNOSIS — M2141 Flat foot [pes planus] (acquired), right foot: Secondary | ICD-10-CM

## 2017-05-14 DIAGNOSIS — M2010 Hallux valgus (acquired), unspecified foot: Secondary | ICD-10-CM

## 2017-05-14 DIAGNOSIS — M2142 Flat foot [pes planus] (acquired), left foot: Secondary | ICD-10-CM

## 2017-05-14 DIAGNOSIS — M19071 Primary osteoarthritis, right ankle and foot: Secondary | ICD-10-CM

## 2017-05-14 DIAGNOSIS — L84 Corns and callosities: Secondary | ICD-10-CM

## 2017-05-14 NOTE — Telephone Encounter (Addendum)
-----   Message from [name], DPM sent at 05/13/2017  2:53 PM EDT ----- She needs to be seen by pain management then. ----- Message ----- From: Andres Ege, RN Sent: 05/13/2017  10:06 AM To: [name], DPM  Dr. Jacqualyn Posey, pt states the percocet is not helping her pain request a change. Please advise. Valery. 05/14/2017-Left message informing pt of Dr. Leigh Aurora orders and  I would be placing referral with [facility]. Faxed required form, clinicals and demographics to [facility]. 05/18/2017-Pt called states she had to take Friday office and had to leave work today due to the severe foot pain, pt states she is elevating and using ice and the Perococet only helps for about 1 hour. I told pt to continue with the resting and ice, I would discuss problem with Dr. Jacqualyn Posey, and I reminded her, I had referred to [facility] for pain management. Pt was crying. Left message informing pt Dr. Jacqualyn Posey wanted her to have an appt this week. Pt states she got the message and will schedule an appt.05/22/2017-Dr. Jacqualyn Posey ordered 4 x 4 sterile gauze for daily dressing changes to right foot L97.411, measuring 1.5 x 1.5 x 0.2cm with moderate exudate, faxed to [facility].

## 2017-05-18 NOTE — Telephone Encounter (Signed)
She should come in to be seen if having that much pain.

## 2017-05-21 ENCOUNTER — Ambulatory Visit (INDEPENDENT_AMBULATORY_CARE_PROVIDER_SITE_OTHER): Payer: BLUE CROSS/BLUE SHIELD

## 2017-05-21 ENCOUNTER — Ambulatory Visit (INDEPENDENT_AMBULATORY_CARE_PROVIDER_SITE_OTHER): Payer: BLUE CROSS/BLUE SHIELD | Admitting: Podiatry

## 2017-05-21 ENCOUNTER — Encounter: Payer: Self-pay | Admitting: Podiatry

## 2017-05-21 DIAGNOSIS — M25571 Pain in right ankle and joints of right foot: Secondary | ICD-10-CM

## 2017-05-21 DIAGNOSIS — L97511 Non-pressure chronic ulcer of other part of right foot limited to breakdown of skin: Secondary | ICD-10-CM

## 2017-05-21 MED ORDER — OXYCODONE-ACETAMINOPHEN 10-325 MG PO TABS: 1.0000 | ORAL_TABLET | Freq: Three times a day (TID) | ORAL | 0 refills | Status: DC | PRN

## 2017-05-22 ENCOUNTER — Other Ambulatory Visit: Payer: Self-pay | Admitting: *Deleted

## 2017-05-22 DIAGNOSIS — M19071 Primary osteoarthritis, right ankle and foot: Secondary | ICD-10-CM

## 2017-05-22 NOTE — Telephone Encounter (Signed)
Pt states she cannot afford the copay on her insulin it is 60 to 80 dollars a month, please call her at [phone]. Pt has appr 3 days of insulin left, please call her asap

## 2017-05-25 ENCOUNTER — Ambulatory Visit: Payer: Self-pay | Admitting: Pharmacist

## 2017-05-25 NOTE — Progress Notes (Signed)
S: Tina [name] is a 46 y.o. female reports to clinical pharmacist appointment for medication management help.   Allergies  Allergen Reactions  . Codeine Other (See Comments)    hallucinations   Medication Sig  ACCU-CHEK FASTCLIX LANCETS MISC Use to test blood glucose 2 times daily. Dx:E11.9  aspirin EC 81 MG tablet Take 1 tablet (81 mg total) by mouth daily.  azithromycin (ZITHROMAX) 250 MG tablet Take 1 tablet (250 mg total) by mouth daily. Take first 2 tablets together, then 1 every day until finished.  Blood Glucose Monitoring Suppl (ACCU-CHEK GUIDE) w/Device KIT 1 kit by Does not apply route 2 (two) times daily. Dx E11.9  cephALEXin (KEFLEX) 500 MG capsule Take 1 capsule (500 mg total) by mouth 3 (three) times daily.  cyclobenzaprine (FLEXERIL) 5 MG tablet Take 1 tablet (5 mg total) by mouth 3 (three) times daily as needed for muscle spasms.  diclofenac sodium (VOLTAREN) 1 % GEL Apply 2 g topically 4 (four) times daily.  enalapril (VASOTEC) 10 MG tablet TAKE ONE TABLET BY MOUTH ONCE DAILY  fluticasone (FLONASE ALLERGY RELIEF) 50 MCG/ACT nasal spray Place 1 spray into both nostrils daily.  glipiZIDE (GLUCOTROL) 5 MG tablet Take 1 tablet (5 mg total) by mouth 2 (two) times daily.  glucose blood (ACCU-CHEK GUIDE) test strip Use to test blood glucose 2 times daily. Dx:E11.9  ibuprofen (ADVIL,MOTRIN) 600 MG tablet Take 1 tablet (600 mg total) by mouth every 6 (six) hours as needed.  insulin detemir (LEVEMIR) 100 UNIT/ML injection Inject 0.2 mLs (20 Units total) into the skin at bedtime.  loratadine (CLARITIN) 10 MG tablet Take 1 tablet (10 mg total) by mouth daily.  metFORMIN (GLUCOPHAGE) 1000 MG tablet Take 1 tablet (1,000 mg total) by mouth 2 (two) times daily with a meal.  Multiple Vitamins-Minerals (MULTIVITAMIN WITH MINERALS) tablet Take 1 tablet by mouth daily.    ondansetron (ZOFRAN) 4 MG tablet Take 1 tablet (4 mg total) by mouth every 8 (eight) hours as needed.   oxyCODONE-acetaminophen (PERCOCET) 10-325 MG tablet Take 1-2 tablets by mouth every 8 (eight) hours as needed for pain. MAXIMUM TOTAL ACETAMINOPHEN DOSE IS 4000 MG PER DAY  oxyCODONE-acetaminophen (PERCOCET/ROXICET) 5-325 MG tablet Take 1-2 tablets by mouth every 6 (six) hours as needed for severe pain.  pravastatin (PRAVACHOL) 40 MG tablet Take 1 tablet (40 mg total) by mouth every evening.  pregabalin (LYRICA) 75 MG capsule Take 1 capsule (75 mg total) by mouth 2 (two) times daily.   Past Medical History:  Diagnosis Date  . Diabetes mellitus    type II  . Fibroids   . HTN (hypertension)   . Hyperlipidemia   . Iron deficiency anemia   . Left acetabular fracture ([place])   . Lumbar strain   . Obesity   . Ovarian cyst    Social History   Social History  . Marital status: Single    Spouse name: N/A  . Number of children: 0  . Years of education: 12   Occupational History  . salesperson Unemployed  .  [employer]   Social History Main Topics  . Smoking status: Current Every Day Smoker    Packs/day: 0.50    Years: 23.00    Types: Cigarettes  . Smokeless tobacco: Never Used     Comment: 1/2PPD  . Alcohol use 0.0 oz/week     Comment: occ  . Drug use: No  . Sexual activity: Yes    Birth control/ protection: Injection   Other Topics  Concern  . Not on file   Social History Narrative   Homosexual; in a monogamous relationship w/ homosexual woman.   No smoking, no drugs, no alcohol   Family History  Problem Relation Age of Onset  . Colon cancer Sister   . Diabetes Mother   . Diabetes Father   . Diabetes Brother   . Celiac disease Paternal Aunt    O: Component Value Date/Time   CHOL 211 (H) 01/21/2016 1442   HDL 28 (L) 01/21/2016 1442   TRIG 342 (H) 01/21/2016 1442   AST 16 12/16/2016 1228   ALT 15 12/16/2016 1228   NA 134 (L) 12/16/2016 1228   NA 142 08/29/2016 1500   K 3.8 12/16/2016 1228   CL 99 (L) 12/16/2016 1228   CO2 25 12/16/2016 1228   GLUCOSE 346 (H)  12/16/2016 1228   HGBA1C >14.0 02/06/2017 1636   HGBA1C 9.9 (H) 02/26/2016 1008   BUN 11 12/16/2016 1228   BUN 13 08/29/2016 1500   CREATININE 0.68 12/16/2016 1228   CREATININE 0.65 01/25/2014 0931   CALCIUM 9.3 12/16/2016 1228   GFRAA >60 12/16/2016 1228   GFRAA >89 01/25/2014 0931   WBC 9.6 12/16/2016 1228   HGB 13.0 12/16/2016 1228   HGB 13.1 08/29/2016 1500   HCT 38.1 12/16/2016 1228   HCT 38.3 08/29/2016 1500   PLT 409 (H) 12/16/2016 1228   PLT 464 (H) 08/29/2016 1500   TSH 1.074 06/27/2011 1627   Ht Readings from Last 2 Encounters:  02/06/17 _0  (1.93 m)  12/16/16 _1  (1.93 m)   Wt Readings from Last 2 Encounters:  02/06/17 266 lb 14.4 oz (121.1 kg)  12/29/16 269 lb 6.4 oz (122.2 kg)   There is no height or weight on file to calculate BMI. BP Readings from Last 3 Encounters:  04/20/17 131/63  02/06/17 136/70  12/29/16 132/72   A/P:  Patient reports diabetes control has been deteriorating due to caring for family members at home and stress at work. She states she was not taking her medications and BG has increased. She now is back on metformin and glipizide, needs help re-starting insulin due to cost.   Provided insulin detemir (Levemir) sample, as well as education on diet and lifestyle to help manage DM. Advised patient to follow up with Debera Lat for further support. Patient verbalized understanding.  Patient also inquired about non-opioid pain treatment. She recently followed up with podiatry for a foot wound that has worsened. Education provided on safe use of NSAIDs and acetaminophen.  An after visit summary was provided and patient advised to follow up if any changes in condition or questions regarding medications arise.  The patient verbalized understanding of information provided by repeating back concepts discussed.  30 minutes spent face-to-face with the patient during the encounter. 80% of time spent on education. 20% of time was spent on  coordination of care.

## 2017-05-26 ENCOUNTER — Telehealth: Payer: Self-pay | Admitting: Podiatry

## 2017-05-26 LAB — COMPLETE METABOLIC PANEL WITH GFR
ALT: 15 U/L (ref 6–29)
AST: 11 U/L (ref 10–35)
Albumin: 4.3 g/dL (ref 3.6–5.1)
Alkaline Phosphatase: 59 U/L (ref 33–115)
BUN: 13 mg/dL (ref 7–25)
CALCIUM: 9.2 mg/dL (ref 8.6–10.2)
CHLORIDE: 104 mmol/L (ref 98–110)
CO2: 26 mmol/L (ref 20–31)
Creat: 0.69 mg/dL (ref 0.50–1.10)
GFR, Est African American: >89 mL/min (ref >=60)
GFR, Est Non African American: >89 mL/min (ref >=60)
GLUCOSE: 112 mg/dL — AB (ref 65–99)
POTASSIUM: 4.6 mmol/L (ref 3.5–5.3)
SODIUM: 139 mmol/L (ref 135–146)
Total Bilirubin: 0.2 mg/dL (ref 0.2–1.2)
Total Protein: 7.5 g/dL (ref 6.1–8.1)

## 2017-05-26 LAB — CBC WITH DIFFERENTIAL/PLATELET
BASOS PCT: 0 %
Basophils Absolute: 0 cells/uL (ref 0–200)
EOS PCT: 2 %
Eosinophils Absolute: 214 cells/uL (ref 15–500)
HCT: 39.1 % (ref 35.0–45.0)
HEMOGLOBIN: 13 g/dL (ref 11.7–15.5)
LYMPHS ABS: 3531 {cells}/uL (ref 850–3900)
Lymphocytes Relative: 33 %
MCH: 28.4 pg (ref 27.0–33.0)
MCHC: 33.2 g/dL (ref 32.0–36.0)
MCV: 85.6 fL (ref 80.0–100.0)
MPV: 9.9 fL (ref 7.5–12.5)
Monocytes Absolute: 749 cells/uL (ref 200–950)
Monocytes Relative: 7 %
NEUTROS ABS: 6206 {cells}/uL (ref 1500–7800)
Neutrophils Relative %: 58 %
Platelets: 445 10*3/uL — ABNORMAL HIGH (ref 140–400)
RBC: 4.57 MIL/uL (ref 3.80–5.10)
RDW: 13.3 % (ref 11.0–15.0)
WBC: 10.7 10*3/uL (ref 3.8–10.8)

## 2017-05-26 LAB — SEDIMENTATION RATE: SED RATE: 27 mm/h — AB (ref 0–20)

## 2017-05-26 LAB — HEMOGLOBIN A1C
HEMOGLOBIN A1C: 8.6 % — AB (ref <5.7)
Mean Plasma Glucose: 200 mg/dL

## 2017-05-26 LAB — C-REACTIVE PROTEIN: CRP: 7.7 mg/L (ref <8.0)

## 2017-05-26 MED ORDER — CEPHALEXIN 500 MG PO CAPS: 500.0000 mg | ORAL_CAPSULE | Freq: Three times a day (TID) | ORAL | 0 refills | Status: DC

## 2017-05-26 MED ORDER — OXYCODONE-ACETAMINOPHEN 10-325 MG PO TABS: 1.0000 | ORAL_TABLET | Freq: Three times a day (TID) | ORAL | 0 refills | Status: DC | PRN

## 2017-05-26 MED ORDER — CEPHALEXIN 500 MG PO CAPS: 500.0000 mg | ORAL_CAPSULE | Freq: Four times a day (QID) | ORAL | 0 refills | Status: DC

## 2017-05-26 MED ORDER — COLLAGENASE 250 UNIT/GM EX OINT: 1.0000 "application " | TOPICAL_OINTMENT | Freq: Every day | CUTANEOUS | 6 refills | Status: DC

## 2017-05-26 NOTE — Progress Notes (Signed)
Subjective: 46 year old female presents the office today for concern is continued bilateral foot pain on the right side worse than left. She states the wound on the outside aspect of the right foot is healed however she starts to get one again on the medial aspect of the foot where she had the wound previously she states that she's having quite a bit of pain to this area. She's been using met a Honey on the wound. She denies any drainage or pus which she has noticed swelling along the area. Denies any redness or red streaks. She states the wound left foot is healed but she still gets quite a bit of pain in general to left foot. We've been trying to get her either orthotics or brace however she states that no reason the office was called her back in regards of this as far as insurance coverage or for casting or melena. She denies any systemic complaints as fevers, chills, nausea, vomiting. She denies any calf pain, chest shortness of breath.  Objective: AAO x3, NAD DP/PT pulses palpable bilaterally, CRT less than 3 seconds Mild decrease in symptoms onto monofilament. Significant flatfoot is present bilaterally. 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 Ulcer on the left foot appears to be healed. Hammertoes are also present.  On the right foot the wounds the office as be healed. There does appear to be recurrence of the wound to the medial aspect of the right foot on the areas of previous ulceration. There is a granular with some fibrotic tissue along wound base appears to superficial and measures proximally 1.5 x 1 cm. There is no probing, undermining or tunneling although due to pain is difficult to evaluate. There is no surrounding erythema, ascending synovitis. There is minimal edema along the area. There is no fluctuance or crepitus there is no malodor. This appears to be along the navicular tuberosity. No open lesions or pre-ulcerative lesions are identified otherwise.  No pain with calf compression, swelling,  warmth, erythema  Assessment: Ulceration right foot with chronic bilateral foot pain likely due to osteoarthritis/5 mechanical changes.  Plan: -All treatment options discussed with the patient including all alternatives, risks, complications.  -At this point the right foot with we'll switch to Santyl dressing changes. It is ordered for her. I will order blood work including A1c, ESR, CRP, CBC, CMP. Continue surgical shoe on the right foot. Monitor for any clinical signs or symptoms of infection and directed to call the office immediately should any occur or go to the ER. Due to the pain is she is having prescribed Percocet. -I discussed the office in the right second orthotic, brace coverage for her. Ultimately long-term she'll continue need some kind conservative brace for her shoes. -RTC 10 days or sooner if needed.   Celesta Gentile, DPM

## 2017-05-26 NOTE — Telephone Encounter (Signed)
I was in there on 19 July and saw Dr. Jacqualyn Posey. The Rx oxycodone 10-325 that he gave me, I could not get that refilled at any pharmacy. Also, he prescribed a cream/ointment for me to use and I got a package on Friday and it was not in there. If you could please call me back.

## 2017-05-26 NOTE — Telephone Encounter (Signed)
Pt states [facility] and [facility] will not fill the oxycodone as written due to the limits place by their policies. I spoke with [facility] [phone] - pharmacist states they can not file rx that exceed 50 meq of the equivalent of morephine/per day and as written it is equal to 90 meq per day, he suggest the rx be reordered differently. Dr. Jacqualyn Posey ordered Percocet 10/325mg  #15 one tablet every 8 hours. I informed pt of Dr. Leigh Aurora 05/26/2017 2:20pm, and pt will pick up the Percocet rx in [place] and Keflex at the [place], and [facility] pharmacy will contact pt with insurance and shipping information. Faxed required form, clinicals and demographics to [place].

## 2017-05-26 NOTE — Telephone Encounter (Signed)
Please rx percocet 10/325 1 tab q8 hr prn pain. Disp #15.   Lets do santyl dressing changes daily if you could please order santyl  Also, please call in keflex 500mg  qh for 10 days given her blood work.   Thank you.

## 2017-05-26 NOTE — Telephone Encounter (Signed)
[place] with [facility] called requesting wound sizes for orders submitted.

## 2017-05-26 NOTE — Telephone Encounter (Signed)
I'm calling for two different reasons. The first one is the Rx that was written when I was there on 19 July

## 2017-05-27 ENCOUNTER — Telehealth: Payer: Self-pay | Admitting: Podiatry

## 2017-05-27 NOTE — Telephone Encounter (Signed)
This is [place] calling from [employer]. We are needing the wound sizes for the santyl ointment order that was sent to [place]. Please call us back at [phone].

## 2017-05-28 ENCOUNTER — Telehealth: Payer: Self-pay | Admitting: *Deleted

## 2017-05-28 NOTE — Telephone Encounter (Addendum)
------   Message from [name], DPM sent at 05/27/2017  3:56 PM EDT ----- A1c elevated- please send to her PCP Sed rete increased which we can see in infection, WBC normal and CRP normal Please let her know the results.05/28/2017-I informed pt of Dr. Leigh Aurora review of results.05/29/2017-Pt states Santyl cost $50.00, needs a less expensive alternative. Routed message to Dr. Jacqualyn Posey. I informed pt of Dr. Leigh Aurora orders.06/01/2017-Pt gave Dr. Isac Sarna [phone]. I called for fax number, fax [phone]. Pt asked if we could bill her co-pay, she has been out of work. I spoke with [facility] and she stated pt could be billed for co-pay. I informed pt.06/02/2017-Left message cancelling pt's referral with [facility]. Faxed referral for Pain Management to [facility] with clinicals and demographics. 06/03/2017-Pt asked when she would go to Pain Clinic and I told her the referral had been made yesterday and they were fast and would call her soon.

## 2017-05-29 ENCOUNTER — Other Ambulatory Visit: Payer: Self-pay | Admitting: *Deleted

## 2017-05-29 DIAGNOSIS — M19071 Primary osteoarthritis, right ankle and foot: Secondary | ICD-10-CM

## 2017-05-29 DIAGNOSIS — E119 Type 2 diabetes mellitus without complications: Secondary | ICD-10-CM

## 2017-05-29 MED ORDER — GLIPIZIDE 5 MG PO TABS: 5.0000 mg | ORAL_TABLET | Freq: Two times a day (BID) | ORAL | 2 refills | Status: DC

## 2017-05-29 MED ORDER — IBUPROFEN 600 MG PO TABS: 600.0000 mg | ORAL_TABLET | Freq: Four times a day (QID) | ORAL | 0 refills | Status: DC | PRN

## 2017-05-29 NOTE — Telephone Encounter (Signed)
She can continue with medi honey for the wound. Also, it may be a good idea for her to be seen at the wound care center again as they were treating her for the same wound previously.

## 2017-05-29 NOTE — Telephone Encounter (Signed)
I asked [place] if she had received the measurements for pt's wound as faxed on 05/26/2017. [place] states she had, but has left 3 message for pt to call. I told [place] I would inform pt and give pt her contact number. I informed pt of [facility] attempts to contact and gave Brittany's number.

## 2017-06-01 ENCOUNTER — Encounter: Payer: Self-pay | Admitting: Podiatry

## 2017-06-01 ENCOUNTER — Telehealth: Payer: Self-pay | Admitting: Podiatry

## 2017-06-01 ENCOUNTER — Ambulatory Visit (INDEPENDENT_AMBULATORY_CARE_PROVIDER_SITE_OTHER): Payer: BLUE CROSS/BLUE SHIELD | Admitting: Podiatry

## 2017-06-01 DIAGNOSIS — L97419 Non-pressure chronic ulcer of right heel and midfoot with unspecified severity: Secondary | ICD-10-CM | POA: Diagnosis not present

## 2017-06-01 DIAGNOSIS — R2681 Unsteadiness on feet: Secondary | ICD-10-CM

## 2017-06-01 DIAGNOSIS — R609 Edema, unspecified: Secondary | ICD-10-CM | POA: Diagnosis not present

## 2017-06-01 DIAGNOSIS — M79673 Pain in unspecified foot: Secondary | ICD-10-CM

## 2017-06-01 DIAGNOSIS — R52 Pain, unspecified: Secondary | ICD-10-CM | POA: Diagnosis not present

## 2017-06-01 NOTE — Telephone Encounter (Signed)
I was trying to see how I can go about getting my medical records. Please call me back at [phone]. Thank you.

## 2017-06-01 NOTE — Progress Notes (Signed)
Pt picked up copies of her medical records she requested at the front desk.

## 2017-06-01 NOTE — Telephone Encounter (Signed)
Called pt back in regards to medical records request. Pt stated she needs all office visit notes, and mri reports to help in regards to her FMLA with her job.

## 2017-06-02 NOTE — Progress Notes (Signed)
Subjective: [name] presents the office to the girlfriend for follow up evaluation of wound to the right foot. She states that the wound still hurts quite a bit she says it hurts more than the wound did which she had previously. She does note some swelling along the area around it but denies any redness or warmth. She states that her sign daily basis. She is continued surgical shoe on the right foot. She denies a drainage or pus and denies any red streaks or redness. Over the outside acid foot she states remains healed and had no issues. She continues with Medihoney on the right foot. I still waiting to see if she will be covered for brace on the right side and her left long-term. Denies any systemic complaints such as fevers, chills, nausea, vomiting. No acute changes since last appointment, and no other complaints at this time.   Objective: AAO x3, NAD DP/PT pulses palpable bilaterally, CRT less than 3 seconds Significant decrease in medial arch height upon weightbearing evidence of flatfoot present bilateral. On the right foot wound does continue on the navicular tuberosity which measures about the same size for a larger 1.5 x 1.2 cm with a granular wound base. This is superficial. There is no swelling erythema, ascending cellulitis an there is localized edema around the area. There is no area of fluctuance or crepitus there is no malodor. The wound to the lateral aspect of the foot appears to be healed with a small hyperkeratotic tissue on the area. There is no other open lesions identified bilateral lower extremities. There is no pain with calf compression, swelling, warmth, erythema.      Assessment: Right medial foot ulceration  Plan: -Treatment options discussed including all alternatives, risks, and complications -Etiology of symptoms were discussed -Given the continued with as well as swelling along the area recommended a repeat MRI of the right foot to rule out abscess and any  other underlying pathology. -I applied an unna boot today. Discussed that when to remove this in 5 days or sooner if needed precautions to look out for. Recommend immobilization in a cam boot which she has at home. When she removes the [employer] she can continue with the Medihoney daily.  -Given her chronic pain of bilateral feet edema recommend pain management. We did put in a referral but will also try for [facility].  -Rx for [facility] given at her request -Monitor for any clinical signs or symptoms of infection and directed to call the office immediately should any occur or go to the ER. -RTC 1 week  *Hopefully will hear back about a custom brace couple of days. I believe that long-term this will be beneficial for her.  Tina [name], DPM

## 2017-06-03 ENCOUNTER — Telehealth: Payer: Self-pay | Admitting: Podiatry

## 2017-06-03 NOTE — Telephone Encounter (Signed)
Pt called saying Dr. Jacqualyn Posey wrote her an Rx for a cane but she needs to know where to go to get it.

## 2017-06-03 NOTE — Telephone Encounter (Signed)
Left message informing pt that she could go to a pharmacy to purchase or go to [facility] in [place].

## 2017-06-08 ENCOUNTER — Telehealth: Payer: Self-pay | Admitting: Podiatry

## 2017-06-08 ENCOUNTER — Ambulatory Visit: Payer: BLUE CROSS/BLUE SHIELD | Admitting: Podiatry

## 2017-06-08 MED ORDER — OXYCODONE-ACETAMINOPHEN 10-325 MG PO TABS: 1.0000 | ORAL_TABLET | Freq: Three times a day (TID) | ORAL | 0 refills | Status: DC | PRN

## 2017-06-08 NOTE — Telephone Encounter (Signed)
I had filled out a [facility] form for collage/sivler but will do another and give it to you. Also, did we put in a pain management referral? Can do percocet 5/325 1 tab PO q 6 h prn pain disp #15

## 2017-06-08 NOTE — Telephone Encounter (Signed)
I informed pt Dr. Jacqualyn Posey, okayed the percocet 10/325mg  #15 one tablet every 8 hours, and ordered Collagen AG, antimicrobial gauze sponge, sterile 4 x 4 gauze, gloves and 1" paper tape for daily dressing changes for right midfoot L97.511, measuring 1.5 x 1.2 x 0.1cm with low exudate as qualifying wound, faxed to [facility].

## 2017-06-08 NOTE — Telephone Encounter (Signed)
I was calling for a few things. First thing, could Dr. Jacqualyn Posey prescribe me some pain medication. I'm in extreme pain and I don't have any pain medication. Secondly, can Dr. Jacqualyn Posey order me some wound bandages? I have the gauze to clean but I don't have any bandages. Lastly, I was wondering if Marcy Siren has been able to get me scheduled for the MRI because I have not heard anything from anybody.

## 2017-06-16 ENCOUNTER — Ambulatory Visit
Admission: RE | Admit: 2017-06-16 | Discharge: 2017-06-16 | Disposition: A | Discharge status: Home / Self Care | Payer: BLUE CROSS/BLUE SHIELD | Source: Ambulatory Visit | Attending: Podiatry | Admitting: Podiatry

## 2017-06-16 ENCOUNTER — Telehealth: Payer: Self-pay

## 2017-06-16 NOTE — Telephone Encounter (Signed)
-----   Message from [name], DPM sent at 06/16/2017  9:38 AM EDT ----- No evidence of bone infection. Severe arthritis. Please let her know.

## 2017-06-16 NOTE — Progress Notes (Signed)
Spoke with patient informing her of MRI results and for her to keep her follow up appt with Dr Jacqualyn Posey

## 2017-06-16 NOTE — Telephone Encounter (Signed)
LVm for patient to return my call to discuss MRI results

## 2017-06-17 ENCOUNTER — Telehealth: Payer: Self-pay | Admitting: Podiatry

## 2017-06-17 NOTE — Telephone Encounter (Signed)
Called pt reguarding the brace that Dr Jacqualyn Posey has requested pt to get. Per pts insurance company for dme pt is responsible for 20% pending she has met her deductible and out of pocket max. Pts estimated cost for the brace would be about 55 dollars pending she has met her out of pocket and deductible. Pt is to discuss at her appt 8.16.18 with Dr Jacqualyn Posey.

## 2017-06-18 ENCOUNTER — Encounter: Payer: Self-pay | Admitting: Podiatry

## 2017-06-18 ENCOUNTER — Ambulatory Visit (INDEPENDENT_AMBULATORY_CARE_PROVIDER_SITE_OTHER): Payer: BLUE CROSS/BLUE SHIELD | Admitting: Podiatry

## 2017-06-18 DIAGNOSIS — M19071 Primary osteoarthritis, right ankle and foot: Secondary | ICD-10-CM

## 2017-06-18 DIAGNOSIS — L97419 Non-pressure chronic ulcer of right heel and midfoot with unspecified severity: Secondary | ICD-10-CM

## 2017-06-18 MED ORDER — OXYCODONE-ACETAMINOPHEN 5-325 MG PO TABS: 1.0000 | ORAL_TABLET | Freq: Four times a day (QID) | ORAL | 0 refills | Status: DC | PRN

## 2017-06-22 NOTE — Progress Notes (Signed)
Subjective: Tina [name] presents the office to the girlfriend for follow up evaluation of wound to the right foot. She did follow-up with [facility] as well and she was apparently checked for RA (which she does not have the results yet) and was started on doxycycline. She says the wound is still painful but she is not in tears today that she normally is. She says the metal on he was burning and she did switch to a collagen silver dressing changes daily. She denies any drainage or pus coming from the area she denies any increase in swelling or redness. She states that she still gets pain and she points on the ankle and on the medial aspect of the ankle where she gets majority of pain. Denies any systemic complaints such as fevers, chills, nausea, vomiting. No acute changes since last appointment, and no other complaints at this time.   Objective: AAO x3, NAD DP/PT pulses palpable bilaterally, CRT less than 3 seconds Significant decrease in medial arch height upon weightbearing evidence of flatfoot present bilateral. On the right foot wound does continue on the navicular tuberosity which measures about the same size at about  1.5 x 1.2 cm with a granular wound base. This is superficial. The wound does appear to be more granular today. There is no surrounding erythema, ascending cellulitis although there is some mild swelling along the area. There is no area of fluctuance or crepitus there is no malodor. The wound to the lateral aspect of the foot appears to be healed with a small hyperkeratotic tissue on the area. There is no other open lesions identified bilateral lower extremities. There is no pain with calf compression, swelling, warmth, erythema.     MRI right: IMPRESSION: 1. No osteomyelitis of the right ankle. 2. Severe osteoarthritis of the tibiotalar joint. 3. Triple arthrodesis of the subtalar joints, talonavicular joint and calcaneocuboid joint.  Assessment: Right medial foot  ulceration  Plan: -Treatment options discussed including all alternatives, risks, and complications -Wound appears to be more granular today. Continue collagen/silver dressing changes daily. -Recommend continue with surgical boot/shoe to help take pressure off the area. -Finish course of doxycycline. -She still is quite a bit of pain to the area. I did prescribe Percocet for her. -She presents his head and in a boot however she stated the pain worse. Recommend continue with elevation as much as she can. -MRI results were discussed with the patient as well. She has significant arthritic changes present ankle. Long-term she will likely need an [place] brace. We'll hold off on this until this wound heals. -Monitor for any clinical signs or symptoms of infection and directed to call the office immediately should any occur or go to the ER. -RTC 1 week  Celesta Gentile, DPM

## 2017-06-29 ENCOUNTER — Ambulatory Visit (INDEPENDENT_AMBULATORY_CARE_PROVIDER_SITE_OTHER): Payer: BLUE CROSS/BLUE SHIELD | Admitting: Internal Medicine

## 2017-06-29 ENCOUNTER — Encounter: Payer: Self-pay | Admitting: Internal Medicine

## 2017-06-29 ENCOUNTER — Telehealth: Payer: Self-pay | Admitting: Podiatry

## 2017-06-29 VITALS — BP 156/99 | HR 106 | Temp 98.3°F | Ht 76.0 in | Wt 284.7 lb

## 2017-06-29 DIAGNOSIS — L97419 Non-pressure chronic ulcer of right heel and midfoot with unspecified severity: Secondary | ICD-10-CM

## 2017-06-29 DIAGNOSIS — L97519 Non-pressure chronic ulcer of other part of right foot with unspecified severity: Secondary | ICD-10-CM

## 2017-06-29 DIAGNOSIS — M19071 Primary osteoarthritis, right ankle and foot: Secondary | ICD-10-CM

## 2017-06-29 DIAGNOSIS — Z833 Family history of diabetes mellitus: Secondary | ICD-10-CM

## 2017-06-29 DIAGNOSIS — E11621 Type 2 diabetes mellitus with foot ulcer: Secondary | ICD-10-CM

## 2017-06-29 DIAGNOSIS — F17210 Nicotine dependence, cigarettes, uncomplicated: Secondary | ICD-10-CM

## 2017-06-29 DIAGNOSIS — E1165 Type 2 diabetes mellitus with hyperglycemia: Secondary | ICD-10-CM | POA: Diagnosis not present

## 2017-06-29 DIAGNOSIS — Z79899 Other long term (current) drug therapy: Secondary | ICD-10-CM

## 2017-06-29 DIAGNOSIS — E119 Type 2 diabetes mellitus without complications: Secondary | ICD-10-CM

## 2017-06-29 DIAGNOSIS — I10 Essential (primary) hypertension: Secondary | ICD-10-CM | POA: Diagnosis not present

## 2017-06-29 DIAGNOSIS — Z794 Long term (current) use of insulin: Secondary | ICD-10-CM

## 2017-06-29 MED ORDER — OXYCODONE-ACETAMINOPHEN 5-325 MG PO TABS
1.0000 | ORAL_TABLET | Freq: Three times a day (TID) | ORAL | 0 refills | Status: DC | PRN
Start: 2017-06-29 — End: 2017-08-17

## 2017-06-29 MED ORDER — PIOGLITAZONE HCL 30 MG PO TABS: 30.0000 mg | ORAL_TABLET | Freq: Every day | ORAL | 6 refills | Status: DC

## 2017-06-29 MED ORDER — METFORMIN HCL 1000 MG PO TABS: 1000.0000 mg | ORAL_TABLET | Freq: Two times a day (BID) | ORAL | 11 refills | Status: DC

## 2017-06-29 MED ORDER — ENALAPRIL MALEATE 10 MG PO TABS: 10.0000 mg | ORAL_TABLET | Freq: Every day | ORAL | 6 refills | Status: DC

## 2017-06-29 MED ORDER — GLIPIZIDE 5 MG PO TABS: 5.0000 mg | ORAL_TABLET | Freq: Two times a day (BID) | ORAL | 6 refills | Status: DC

## 2017-06-29 NOTE — Patient Instructions (Signed)
It was nice to meet you today, Tina [name].   Please start taking pioglitazone 30mg  1 tablet once a day. Please call the Internal Medicine clinic if you have any issues with this medication.   I refilled your metformin, glipizide, and enalapril and sent the prescriptions to the [facility] on [address]   Please follow up with me (Dr. Frederico Hamman) or in the Acute Care clinic in 2 months.

## 2017-06-29 NOTE — Addendum Note (Signed)
Addended by: Harriett Sine D on: 06/29/2017 03:51 PM   Modules accepted: Orders

## 2017-06-29 NOTE — Telephone Encounter (Addendum)
Left message informing pt Dr. Jacqualyn Posey had refilled the percocet. Faxed required referral form, clinicals and demographics to [facility]. I gave pt copy of [facility] and her rx for Oxycodone.

## 2017-06-29 NOTE — Telephone Encounter (Signed)
I was calling to see if Dr. Jacqualyn Posey would give me an Rx for pain. My foot has been hurting all weekend.

## 2017-06-30 MED ORDER — IBUPROFEN 600 MG PO TABS: 600.0000 mg | ORAL_TABLET | Freq: Four times a day (QID) | ORAL | 0 refills | Status: DC | PRN

## 2017-06-30 NOTE — Progress Notes (Signed)
Internal Medicine Clinic Attending  I saw and evaluated the patient.  I personally confirmed the key portions of the history and exam documented by Dr. Santos-Sanchez and I reviewed pertinent patient test results.  The assessment, diagnosis, and plan were formulated together and I agree with the documentation in the resident's note. 

## 2017-06-30 NOTE — Assessment & Plan Note (Signed)
BP today 156/99. Has been Currently on enalapril 10 mg daily and compliant. Patient states BP elevated due to pain in R foot secondary to ulcer. Denies headache, blurry vision, chest pain, shortness of breath.  - Continue enalapril 20 mg. Consider adding a second antihypertensive agent at next visit if BP remains elevated - Refilled enalapril

## 2017-06-30 NOTE — Assessment & Plan Note (Signed)
R foot ulcer located on medial side. Reports 10/10 constant pain in R foot. She is having difficulty ambulating and at work due to pain. Currently on Percocet 5 with very minimal improvement in pain.  Patient follows up with Dr. Jacqualyn Posey from podiatry. Last seen on 06/18/2017. MRI negative for osteomyelitis. She was recommended to continue collagen/silver dressing and to continue using surgical boot. However, patient states difficulty using surgical boot secondary to pain. Currently on doxycycline.   On exam, R foot ulcer slowly healing. No erythema, warmth, or discharge noted. Granulation tissue noted. Patient was exquisitely tender to touch. Low suspicion for active infection at this time. Suspect pain is secondary to healing wound and severe arthritis as evidenced by MRI.   R foot ulcer: healing  - Ibuprofen 600mg  q6h PRN for pain  - Recommended to continue following up with her pain clinic for pain management  - Recommended patient to finish antibiotic course. She has 4 days left of doxycycline.

## 2017-06-30 NOTE — Progress Notes (Signed)
   CC: T2DM and R foot ulcer follow up   HPI:  [name] is a 46 y.o. female with PMH listed below who presents to clinic forT2DM and R foot ulcer follow up. Please see problem based assessment and plan for further details.  Past Medical History:  Diagnosis Date  . Diabetes mellitus    type II  . Fibroids   . HTN (hypertension)   . Hyperlipidemia   . Iron deficiency anemia   . Left acetabular fracture ([place])   . Lumbar strain   . Obesity   . Ovarian cyst    Review of Systems:   Review of Systems  Constitutional: Negative for chills, diaphoresis, fever and malaise/fatigue.  Respiratory: Negative for cough and shortness of breath.   Cardiovascular: Negative for chest pain, palpitations and leg swelling.  Gastrointestinal: Negative for abdominal pain, constipation, diarrhea, nausea and vomiting.  Genitourinary: Negative for frequency and urgency.  Musculoskeletal: Positive for joint pain and myalgias. Negative for falls.     Physical Exam:  Vitals:   06/29/17 1626  BP: (!) 156/99  Pulse: (!) 106  Temp: 98.3 F (36.8 C)  TempSrc: Oral  SpO2: 100%  Weight: 284 lb 11.2 oz (129.1 kg)  Height: 6\' 4"  (1.93 m)   General: overweight female, well-developed, in no acute distress, upset at times due to pain  HENT: NCAT, neck supple and FROM, MMM, OP clear without exudates or erythema Cardiac: regular rate and rhythm, nl S1/S2, no murmurs, rubs or gallops  Pulm: CTAB, no wheezes or crackles, no increased work of breathing  Abd: soft, NTND, bowel sounds present   Ext: warm and well perfused, 1+ peripheral edema on RLE up to ankle  Derm: R foot ulcer located on medial side. No erythema, warmth, or discharge associated with lesion. Granulation tissue noted (picture on media tab 8/16)   Assessment & Plan:   See Encounters Tab for problem based charting.  Patient seen with Dr. Angelia [name]

## 2017-06-30 NOTE — Assessment & Plan Note (Addendum)
Patient presents for DM follow up. Should be on Levemir 20U QHS, metfomin 1000 BID, and glipizide 5 BID. However, patient states she is unable to afford Levemir and has not been using it for the past month. She did not bring her BG meter with her. Last A1c 8.6 on 05/2017. Denies polydipsia and polyuria.   T2DM: stable, poorly controlled  - Stop Levemir due to affordability issues - Start pioglitazone 30 mg daily as this is covered by [employer]. Unfortunately, insulin as not covered by insurance. Patient aware of side effects and voiced understanding. - Refilled metformin 1000 mg BID and glipizide 5 mg BID - Foot exam and referral for eye exam at next visit - Follow-up in 2 months either with PCP or at [facility]. Patient will need A1c, foot exam, and referral for eye exam at next visit. She is also due for Pap smear.

## 2017-07-02 ENCOUNTER — Encounter: Payer: Self-pay | Admitting: Podiatry

## 2017-07-02 ENCOUNTER — Ambulatory Visit (INDEPENDENT_AMBULATORY_CARE_PROVIDER_SITE_OTHER): Payer: BLUE CROSS/BLUE SHIELD | Admitting: Podiatry

## 2017-07-02 DIAGNOSIS — L97419 Non-pressure chronic ulcer of right heel and midfoot with unspecified severity: Secondary | ICD-10-CM | POA: Diagnosis not present

## 2017-07-02 NOTE — Telephone Encounter (Addendum)
Dr. Jacqualyn Posey ordered refill of Collagen with AG.Elmyra Ricks - [facility] states pt's insurance will not cover refill of the Collagen with AG ordered 06/08/2017. Left message informing pt of Nicole's explanation of her insurance coverage for collagen with AG refill, and to use Collagen with AG to have enough to get to Wednesday or Thursday. Dr. March Rummage states if not a lot of drainage may use silvadene cream, but if a lot of drainage may need to change to another medication. Dr. Jacqualyn Posey requested copy of rheumatology panel drawn at [facility]. [facility] states will fax a copy to our office. Pt states she got message to use the Collagen with AG sparingly, due to insurance coverage and shipping, and asked to speak with Marcie Bal concerning disability paperwork.

## 2017-07-07 ENCOUNTER — Ambulatory Visit (INDEPENDENT_AMBULATORY_CARE_PROVIDER_SITE_OTHER): Payer: BLUE CROSS/BLUE SHIELD

## 2017-07-07 ENCOUNTER — Telehealth: Payer: Self-pay | Admitting: Podiatry

## 2017-07-07 VITALS — BP 132/81 | HR 99

## 2017-07-07 DIAGNOSIS — Z3042 Encounter for surveillance of injectable contraceptive: Secondary | ICD-10-CM | POA: Diagnosis not present

## 2017-07-07 MED ORDER — OXYCODONE-ACETAMINOPHEN 5-325 MG PO TABS: 1.0000 | ORAL_TABLET | Freq: Three times a day (TID) | ORAL | 0 refills | Status: DC | PRN

## 2017-07-07 MED ORDER — MEDROXYPROGESTERONE ACETATE 150 MG/ML IM SUSP
150.0000 mg | Freq: Once | INTRAMUSCULAR | Status: AC
  Administered 2017-07-07: 150 mg via INTRAMUSCULAR

## 2017-07-07 NOTE — Progress Notes (Signed)
Chart reviewed for nurse visit. Agree with plan of care.   Luvenia Redden, PA-C 07/07/2017 3:50 PM

## 2017-07-07 NOTE — Telephone Encounter (Signed)
I was calling to see if Dr. Jacqualyn Posey could give me something for pain because I'm in a lot of pain. You can call me back at [phone].

## 2017-07-07 NOTE — Progress Notes (Signed)
Patient presented to the office for Depo injection.Patient tolerated well.Next Depo is due Nov 20-Dec 4.

## 2017-07-07 NOTE — Addendum Note (Signed)
Addended by: Harriett Sine D on: 07/07/2017 02:22 PM   Modules accepted: Orders

## 2017-07-07 NOTE — Telephone Encounter (Signed)
Dr. Berton Lan refill percocet as previously on 06/29/2017. I informed pt she could pick up the rx in the [place] office.

## 2017-07-08 NOTE — Progress Notes (Signed)
Subjective: Tina [name] presents the office to the girlfriend for follow up evaluation of wound to the right foot. She states that she is doing better and the wound is again somewhat smaller but she still having quite a bit of pain to the area. She's been using the collagen/silver dressing daily. She states that she did not follow-up with the doctor from [place] to get the results of the RF test.  Denies any drainage or pus and denies any surrounding redness or red streaks. She states that she is still out of work because she cannot wear regular shoe.  Denies any systemic complaints such as fevers, chills, nausea, vomiting. No acute changes since last appointment, and no other complaints at this time.   Objective: AAO x3, NAD DP/PT pulses palpable bilaterally, CRT less than 3 seconds Significant decrease in medial arch height upon weightbearing evidence of flatfoot present bilateral. On the right foot wound does continue on the navicular tuberosity which measures about the same size at about  1.3 x 1 cm with a granular wound base. This is superficial. The wound is fibro-granular.. There is no swelling erythema, ascending cellulitis. There is mild edema around the area but overall the swelling appears to be improved. No other open lesions or pre-ulcer lesions identified.       MRI right: IMPRESSION: 1. No osteomyelitis of the right ankle. 2. Severe osteoarthritis of the tibiotalar joint. 3. Triple arthrodesis of the subtalar joints, talonavicular joint and calcaneocuboid joint.  Assessment: Right medial foot ulceration, mild improvement  Plan: -Treatment options discussed including all alternatives, risks, and complications -Continue his collagen, silver dressing changes daily. I think she would be a good for Santyl but due to cost she did not get this. -Continue surgical shoe and offloading. -She is a supplies and we will gets reordered for her. -Also a new referral was placed for pain  management for [facility]- has appointment in the next 2 weeks.  -Discussed wound care evaluation but she declined this today. -Finish course of antibiotics. Once complete removal of any further antibiotics this point. -Monitor for any clinical signs or symptoms of infection and directed to call the office immediately should any occur or go to the ER. -RTC as scheduled or sooner if needed.   *I spent 20 min in face to face time with her today.   Celesta Gentile, DPM

## 2017-07-17 ENCOUNTER — Ambulatory Visit: Payer: BLUE CROSS/BLUE SHIELD | Admitting: Podiatry

## 2017-07-27 ENCOUNTER — Telehealth: Payer: Self-pay

## 2017-07-27 ENCOUNTER — Ambulatory Visit (INDEPENDENT_AMBULATORY_CARE_PROVIDER_SITE_OTHER): Payer: BLUE CROSS/BLUE SHIELD | Admitting: Podiatry

## 2017-07-27 ENCOUNTER — Encounter: Payer: Self-pay | Admitting: Podiatry

## 2017-07-27 DIAGNOSIS — L97419 Non-pressure chronic ulcer of right heel and midfoot with unspecified severity: Secondary | ICD-10-CM | POA: Diagnosis not present

## 2017-07-27 DIAGNOSIS — L84 Corns and callosities: Secondary | ICD-10-CM

## 2017-07-27 NOTE — Telephone Encounter (Signed)
Called [facility]. Authorized refill(s) of wound care supplies.

## 2017-07-29 NOTE — Progress Notes (Signed)
Subjective: Ms. Lemoine presents the office to the office today for follow up evaluation of wound to the right foot with her girlfriend. She states that she's been using the metal any as well as the collagen pad to the wound to the right foot and this has been doing better. She still has quite a bit of pain to her ankle and she points to more for ankle and the right side today than actual wound. She has not has any significant swelling along the wound she denies any redness or drainage or any pus. She does get some pain recurring the left second toe but she is not noticed any open disorders a callus swelling over the area where she had the wound previously. No other concerns today. Denies any systemic complaints such as fevers, chills, nausea, vomiting. No acute changes since last appointment, and no other complaints at this time.   Objective: AAO x3, NAD DP/PT pulses palpable bilaterally, CRT less than 3 seconds Significant decrease in medial arch height upon weightbearing evidence of flatfoot present bilateral. On the right foot wound does continue on the navicular tuberosity which measures about the same size at about  1.2 x 1 cm with a granular wound base. [place] wound appears to be very superficial compared space tenderness Over. There is no drainage or pus and there is decreased tenderness to palpation the area. There is no swelling edema to this and is no ascending synovitis. There is no fractures or crepitus. There is no malodor. There does appear to be tenderness along the anterior medial aspect of the ankle joint. Along the medial aspect left second DIPJ is a hyperkeratotic lesion. Upon debridement there is no underlying ulceration, drainage or any signs of infection.  There is tenderness to palpation along the hyperkeratotic nt. No other open lesions or pre-ulcerative lesions identified bilaterally.  there is no pain with calf compression, swelling, warmth, erythema.   MRI  right: IMPRESSION: 1. No osteomyelitis of the right ankle. 2. Severe osteoarthritis of the tibiotalar joint. 3. Triple arthrodesis of the subtalar joints, talonavicular joint and calcaneocuboid joint.  Assessment: Right medial foot ulceration, mild improvement  Plan: -Treatment options discussed including all alternatives, risks, and complications -Regards the wound right foot directly continue with the collagen, silver dressing pad daily. Much free sizer symptoms of infection. -Hyperkeratotic lesions of the left second toe sharply debrided to the any complications or bleeding -Discussed the long-term she'll need an ankle brace the right side and I want opposite of the wound heals. -Continue to hold off on returning to work into the wound heals on the right foot given the pain she cannot wear regular shoes. -Again declined wound care evaluation -Monitor for any clinical signs or symptoms of infection and directed to call the office immediately should any occur or go to the ER. -RTC as scheduled or sooner if needed.  Celesta Gentile, DPM

## 2017-08-11 ENCOUNTER — Other Ambulatory Visit: Payer: Self-pay | Admitting: Internal Medicine

## 2017-08-11 DIAGNOSIS — M19071 Primary osteoarthritis, right ankle and foot: Secondary | ICD-10-CM

## 2017-08-17 ENCOUNTER — Encounter: Payer: Self-pay | Admitting: Podiatry

## 2017-08-17 ENCOUNTER — Ambulatory Visit (INDEPENDENT_AMBULATORY_CARE_PROVIDER_SITE_OTHER): Payer: BLUE CROSS/BLUE SHIELD | Admitting: Podiatry

## 2017-08-17 VITALS — BP 126/81 | HR 87 | Resp 16

## 2017-08-17 DIAGNOSIS — L97419 Non-pressure chronic ulcer of right heel and midfoot with unspecified severity: Secondary | ICD-10-CM

## 2017-08-18 ENCOUNTER — Encounter: Payer: Self-pay | Admitting: Podiatry

## 2017-08-20 NOTE — Progress Notes (Signed)
Subjective: Tina [name] presents the office to the office today for follow up evaluation of wound to the right foot with her girlfriend. Overall she feels that the wound is getting smaller but does continue very painful. She is getting pushed back from work about going back but she is fearful activity. She's been trying to put a shoe on however due to pain she is unable to do this and unable to go back to work. She states that she still gets pain she points in the ankle joint as well as he gets occasional swelling to this irritation on her foot and ankle for some time. She denies any recent injury trauma. She denies any drainage or pus coming from the wound or any redness or red streaks. She has no other concerns today. Denies any systemic complaints such as fevers, chills, nausea, vomiting. No acute changes since last appointment, and no other complaints at this time.   Objective: AAO x3, NAD DP/PT pulses palpable bilaterally, CRT less than 3 seconds Significant decrease in medial arch height upon weightbearing evidence of flatfoot present bilateral. On the right foot wound does continue on the navicular tuberosity which measures smaller at 1 x 0.5 cm in a superficial. Today there is more hyperkeratotic tissue around the wound which is painful. There is no surrounding erythema, ascending saline as. There is no fractures or crepitus. There is no malodor. Is no clinical signs of infection present. There is tenderness on the anterior medial aspect of the ankle joint. There is no other area of tenderness of them for this time. There is no pain with calf compression, swelling, warmth, erythema.   MRI right: IMPRESSION: 1. No osteomyelitis of the right ankle. 2. Severe osteoarthritis of the tibiotalar joint. 3. Triple arthrodesis of the subtalar joints, talonavicular joint and calcaneocuboid joint.  Assessment: Right medial foot ulceration  Plan: -Treatment options discussed including all  alternatives, risks, and complications -Lidocaine ointment was applied to the area and I was able to debride the hyperkeratotic tissue on the wound. A liner continue the collagen, silver dressing changes daily and the wound is improving. Is no clinical signs of infection identified at this time. Continue a surgical shoe although she has not been wearing this since she's been wearing a flip-flop. Again declines wound care referral she states that I been doing better with getting the wound healed.  -Monitor [place] brace for this ankle. Once we get this wound to heal more likely get her casted. -At this point she cannot return to work as she could not go back full activity with no restrictions. She is able to wear shoe. She cannot go back with restrictions and therefore she did not return back to work at this point. -Monitor for any clinical signs or symptoms of infection and directed to call the office immediately should any occur or go to the ER. -RTC 3 weeks or sooner if needed. She had no further questions today.   Tina [name], DPM

## 2017-08-25 DIAGNOSIS — M79676 Pain in unspecified toe(s): Secondary | ICD-10-CM

## 2017-09-07 ENCOUNTER — Encounter: Payer: Self-pay | Admitting: Podiatry

## 2017-09-07 ENCOUNTER — Ambulatory Visit (INDEPENDENT_AMBULATORY_CARE_PROVIDER_SITE_OTHER): Payer: BLUE CROSS/BLUE SHIELD | Admitting: Podiatry

## 2017-09-07 DIAGNOSIS — M19071 Primary osteoarthritis, right ankle and foot: Secondary | ICD-10-CM

## 2017-09-07 DIAGNOSIS — L97419 Non-pressure chronic ulcer of right heel and midfoot with unspecified severity: Secondary | ICD-10-CM

## 2017-09-09 NOTE — Progress Notes (Signed)
Subjective: Tina [name] presents the office to the office today for follow up evaluation of wound to the right foot. She states that overall she is doing better from the wound is healing but is still painful. She also states that she is getting pain to her right ankle. She presents a 1 and a flip-flop sandal. She denies any drainage or pus coming from the wound. She also states that the second on the left foot hurts and this been ongoing for some time. She had pain to both of her feet and ankles for a long time.  She presents a very tearful and upset because she got fired her job on Friday. She is frustrated because she cannot stand or wear shoe for long period time because of the pain to her ankle as well as the wound on her right foot. She has no other concerns today.  Denies any systemic complaints such as fevers, chills, nausea, vomiting. No acute changes since last appointment, and no other complaints at this time.   Objective: AAO x3, NAD DP/PT pulses palpable bilaterally, CRT less than 3 seconds Significant decrease in medial arch height upon weightbearing evidence of flatfoot present bilateral. On the right foot wound does continue on the navicular tuberosity which measures smaller at 1 x 0.5 cm in a superficial and appears to be scabbing over today. There is still tenderness palpation to the area. There is no surrounding erythema, ascending cellulitis. There is no questions or crepitus. There is no malodor. There is tenderness only anterior medial aspect of the ankle joint. There is pain with ankle joint range of motion. There is no other areas of pinpoint tenderness. There is a significant decrease in medial arch height bilaterally. There is no other open lesions or pre-ulcerative lesions. There is no pain with calf compression, swelling, warmth, erythema.   MRI right: IMPRESSION: 1. No osteomyelitis of the right ankle. 2. Severe osteoarthritis of the tibiotalar joint. 3. Triple  arthrodesis of the subtalar joints, talonavicular joint and calcaneocuboid joint.  Assessment: Right medial foot ulceration  Plan: -Treatment options discussed including all alternatives, risks, and complications Wound appears to be healing and certainly scab over all those still painful. Recommend continue collagenase dressing changes daily which she has at home. Recommend surgical shoe/boot that she states this makes it uncomfortable as she continues to wear a flip-flop sandal. -She states that she got fired because of the note that I wrote for her. Discussed with her this is not because of my note specifically. The restrictions that I put on the note were what she had asked for and we agreed upon. Unfortunately, she was not able to wear a shoe and stand for long periods of time given the wound and the amount of arthritis in her ankle. Therefore, she was not allowed to go back to work she states unless she could go back to work at full duty with no restrictions. , Try to work with her and getting her into the [facility] or the "orange card". She is very thankful of the help that I have given her. I again discussed a wound care referral but she states "you treat the wound better than they did". Long term she is doing to need a custom brace to help support the right foot and ankle given the flatfoot, history of triple arthrodesis and ankle arthritis.   Celesta Gentile, DPM

## 2017-09-21 ENCOUNTER — Ambulatory Visit: Payer: Self-pay | Admitting: Podiatry

## 2017-09-22 ENCOUNTER — Ambulatory Visit (INDEPENDENT_AMBULATORY_CARE_PROVIDER_SITE_OTHER): Payer: Self-pay | Admitting: *Deleted

## 2017-09-22 VITALS — BP 146/69 | HR 74 | Wt 263.8 lb

## 2017-09-22 DIAGNOSIS — Z3042 Encounter for surveillance of injectable contraceptive: Secondary | ICD-10-CM

## 2017-09-22 MED ORDER — MEDROXYPROGESTERONE ACETATE 150 MG/ML IM SUSP
150.0000 mg | Freq: Once | INTRAMUSCULAR | Status: AC
  Administered 2017-09-22: 150 mg via INTRAMUSCULAR

## 2017-09-22 NOTE — Progress Notes (Signed)
Depo Provera 150 mg administered as scheduled.  Next injection due 2/5-2/19/19. Per chart review, pt was informed on 09/01/16 and 04/20/17 that she needed Annual Gyn exam however appt was not scheduled. Pt informed today that she will need Annual Gyn exam prior to next Depo Provera injection. She voiced understanding.

## 2017-09-22 NOTE — Progress Notes (Signed)
Patient seen and assessed by nursing staff.  Agree with documentation and plan.  

## 2017-09-23 ENCOUNTER — Telehealth: Payer: Self-pay | Admitting: Podiatry

## 2017-09-23 NOTE — Telephone Encounter (Signed)
I told pt that Dr. Jacqualyn Posey would not prescribe pain medications without seeing her again, since she had been seen at pain management. Pt states she didn't have to hear all that she'd just talk to someone else.

## 2017-09-23 NOTE — Telephone Encounter (Signed)
I was calling to see if Dr. Jacqualyn Posey could prescribe me something for pain. I'm having foot pain and I no longer going to the pain clinic anymore because I lost my job and I no longer have insurance. Please call me back at [phone].

## 2017-10-09 ENCOUNTER — Ambulatory Visit (INDEPENDENT_AMBULATORY_CARE_PROVIDER_SITE_OTHER): Payer: Self-pay | Admitting: Podiatry

## 2017-10-09 ENCOUNTER — Encounter: Payer: Self-pay | Admitting: Podiatry

## 2017-10-09 VITALS — BP 136/94 | HR 90

## 2017-10-09 DIAGNOSIS — L84 Corns and callosities: Secondary | ICD-10-CM

## 2017-10-09 DIAGNOSIS — L97419 Non-pressure chronic ulcer of right heel and midfoot with unspecified severity: Secondary | ICD-10-CM

## 2017-10-09 MED ORDER — OXYCODONE-ACETAMINOPHEN 10-325 MG PO TABS: 1.0000 | ORAL_TABLET | Freq: Three times a day (TID) | ORAL | 0 refills | Status: DC | PRN

## 2017-10-13 NOTE — Progress Notes (Signed)
Subjective: Tina [name] presents the office today for follow-up evaluation of a wound to the right foot.  She states that the area is doing much better and she has not had any drainage or pus coming from the area but the area is still painful.  She is able to wear regular shoe at this point still because of the wound.  She presents today for follow-up evaluation.  She denies any increase in swelling to her foot she denies any redness or drainage.  She also gets calluses which are painful to her left foot.  She is still out of work. Denies any systemic complaints such as fevers, chills, nausea, vomiting. No acute changes since last appointment, and no other complaints at this time.   Objective: AAO x3, NAD DP/PT pulses palpable bilaterally, CRT less than 3 seconds Significant decrease in medial arch upon weightbearing.  Ulceration does continue on the medial aspect of the foot measuring approximately 0.8 x 0.5 cm however it is starting to scab over.  The area is very dry.  There is no drainage or pus there is no ascending sialitis.  There is no fluctuation or crepitation.  There is no malodor.  Hyperkeratotic lesion the left second toe.  No underlying ulceration drainage or signs of infection are present. No open lesions or pre-ulcerative lesions.  No pain with calf compression, swelling, warmth, erythema  Assessment: Healing ulceration right foot; pre-ulcerative callus left foot   Plan: -All treatment options discussed with the patient including all alternatives, risks, complications.  -The wound on the right foot appears to be healing well and starting to dry up and scab over.  The area still tender and she could not wear a regular shoe.  Continue with the collagen pads to the wound daily.  Monitor for any signs or symptoms of infection. -I debrided the hyperkeratotic lesion of the left second toe today without any complications or bleeding.  At her request I also contacted -Tina [name] and  spoke to her directly about her return to work status.  I think in the next 6-8 weeks hopefully she can be able to wear regular shoe and return to work.  I taught her directly and I called [phone] extension [phone]. -She is also applied for Medicaid.  I did give her information to apply for the orange color due to lack of insurance. -Patient encouraged to call the office with any questions, concerns, change in symptoms.  -RTC 3 weeks or sooner if needed.   [name] DPM

## 2017-10-18 ENCOUNTER — Emergency Department (HOSPITAL_BASED_OUTPATIENT_CLINIC_OR_DEPARTMENT_OTHER)
Admission: EM | Admit: 2017-10-18 | Discharge: 2017-10-18 | Discharge status: Against Medical Advice | Payer: Self-pay | Attending: Emergency Medicine | Admitting: Emergency Medicine

## 2017-10-18 ENCOUNTER — Other Ambulatory Visit: Payer: Self-pay

## 2017-10-18 ENCOUNTER — Encounter (HOSPITAL_BASED_OUTPATIENT_CLINIC_OR_DEPARTMENT_OTHER): Payer: Self-pay | Admitting: *Deleted

## 2017-10-18 DIAGNOSIS — Z5321 Procedure and treatment not carried out due to patient leaving prior to being seen by health care provider: Secondary | ICD-10-CM | POA: Insufficient documentation

## 2017-10-18 DIAGNOSIS — M549 Dorsalgia, unspecified: Secondary | ICD-10-CM | POA: Insufficient documentation

## 2017-10-18 NOTE — ED Notes (Signed)
Called for room. Not in lobby

## 2017-10-18 NOTE — ED Triage Notes (Signed)
2 days of back pain. Reports hx of sciatica. Denies new injury

## 2017-10-18 NOTE — ED Notes (Signed)
Called in waiting room, no answer

## 2017-10-18 NOTE — ED Notes (Signed)
Called in lobby, no answer

## 2017-10-30 ENCOUNTER — Ambulatory Visit (INDEPENDENT_AMBULATORY_CARE_PROVIDER_SITE_OTHER): Payer: Self-pay | Admitting: Podiatry

## 2017-10-30 ENCOUNTER — Encounter: Payer: Self-pay | Admitting: Podiatry

## 2017-10-30 DIAGNOSIS — E1149 Type 2 diabetes mellitus with other diabetic neurological complication: Secondary | ICD-10-CM

## 2017-10-30 DIAGNOSIS — L97511 Non-pressure chronic ulcer of other part of right foot limited to breakdown of skin: Secondary | ICD-10-CM

## 2017-10-30 DIAGNOSIS — L84 Corns and callosities: Secondary | ICD-10-CM

## 2017-10-30 DIAGNOSIS — G629 Polyneuropathy, unspecified: Secondary | ICD-10-CM

## 2017-10-30 MED ORDER — OXYCODONE-ACETAMINOPHEN 10-325 MG PO TABS: 1.0000 | ORAL_TABLET | Freq: Three times a day (TID) | ORAL | 0 refills | Status: DC | PRN

## 2017-11-04 NOTE — Progress Notes (Signed)
Subjective: Tina [name] presents the office today for follow-up evaluation of a wound to the right foot.  States that the wound is getting better but still painful.  She also has numerous calluses to the left foot which are painful with walking.  She still complains of pain in the right ankle joint as well.  Because of her feet she is still out of work.  She has been extended for 6 weeks.  She has no other open sores that she knows of and she denies any swelling to her feet or any redness or drainage or pus.  She has no other concerns today.  Overall she feels well and she denies any systemic complaints including fevers, chills, nausea, vomiting, chest pain, shortness of breath or calf pain.  Objective: AAO x3, NAD DP/PT pulses palpable bilaterally, CRT less than 3 seconds Significant decrease in medial arch upon weightbearing.  Ulceration does continue on the medial aspect of the foot measuring approximately 0.6 x 0.5 cm however it is starting to scab over.  The scab was loose and was able to remove this today the underlying wound is healing well there is new, pink skin and only a very small area of skin breakdown but overall the area measures 0.6 x 0.5 cm.  There is still mild tenderness palpation to this area but does appear to be improved.  There is tenderness of the anterior ankle joint line as well as the anteromedial ankle joint.  On the left side there is a hyperkeratotic lesion to the medial second digit as well as 3 small punctate annular lesions on the plantar left foot.  Upon debridement there is no underlying ulceration, drainage or any signs of infection noted.  There is a decrease in medial arch height upon weightbearing.  Previous triple arthrodesis on the right side.  HAV present bilaterally.  I was previously present on the lateral right ankle/foot is well-healed and there is no other wounds identified. No open lesions or pre-ulcerative lesions.  No pain with calf compression,  swelling, warmth, erythema  Assessment: Healing ulceration right foot; pre-ulcerative callus left foot; no signs of infection  Plan: -All treatment options discussed with the patient including all alternatives, risks, complications.  -The wound actually appears to be healing and was able to debride some of the loose scab overlying the area.  Continue with collagen, silver dressing changes daily. -I sharply debrided the hyperkeratotic lesions on the left foot x4 without any complications or bleeding.  Continue offloading.  No wounds on left side. -Long-term she will likely need an AFO brace of the right ankle or surgical intervention.  She does not want to proceed with any further surgery.  Once we get the wound to heal forward and work on the brace for now unfortunately she does not have insurance.  He can try standard braces in the meantime we will hold off until the wound is further healed. -Due to insurance she is not been able to pain management.  Did prescribe Percocet for her today however discussed long-term I cannot be given her pain medication.  She would be better served with pain management. -She is in the process of applying for Medicaid and disability. -Monitor for any clinical signs or symptoms of infection and directed to call the office immediately should any occur or go to the ER. -Follow-up as scheduled or sooner if needed.  Call any questions or concerns meantime and she agrees this plan has no further concerns or questions.  Rodman Key  Lynwood Dawley DPM

## 2017-11-06 ENCOUNTER — Other Ambulatory Visit: Payer: Self-pay

## 2017-11-06 ENCOUNTER — Encounter: Payer: Self-pay | Admitting: Dietician

## 2017-11-06 ENCOUNTER — Encounter: Payer: Self-pay | Admitting: Internal Medicine

## 2017-11-06 ENCOUNTER — Ambulatory Visit (INDEPENDENT_AMBULATORY_CARE_PROVIDER_SITE_OTHER): Payer: Self-pay | Admitting: Internal Medicine

## 2017-11-06 ENCOUNTER — Encounter (INDEPENDENT_AMBULATORY_CARE_PROVIDER_SITE_OTHER): Payer: Self-pay

## 2017-11-06 VITALS — BP 148/79 | HR 87 | Temp 98.3°F | Ht 76.0 in | Wt 260.8 lb

## 2017-11-06 DIAGNOSIS — R05 Cough: Secondary | ICD-10-CM

## 2017-11-06 DIAGNOSIS — K0889 Other specified disorders of teeth and supporting structures: Secondary | ICD-10-CM

## 2017-11-06 DIAGNOSIS — Z7984 Long term (current) use of oral hypoglycemic drugs: Secondary | ICD-10-CM

## 2017-11-06 DIAGNOSIS — R0981 Nasal congestion: Secondary | ICD-10-CM

## 2017-11-06 DIAGNOSIS — R5383 Other fatigue: Secondary | ICD-10-CM

## 2017-11-06 DIAGNOSIS — E119 Type 2 diabetes mellitus without complications: Secondary | ICD-10-CM

## 2017-11-06 DIAGNOSIS — R5381 Other malaise: Secondary | ICD-10-CM

## 2017-11-06 DIAGNOSIS — J45909 Unspecified asthma, uncomplicated: Secondary | ICD-10-CM

## 2017-11-06 DIAGNOSIS — R059 Cough, unspecified: Secondary | ICD-10-CM | POA: Insufficient documentation

## 2017-11-06 DIAGNOSIS — F17210 Nicotine dependence, cigarettes, uncomplicated: Secondary | ICD-10-CM

## 2017-11-06 LAB — POCT GLYCOSYLATED HEMOGLOBIN (HGB A1C)

## 2017-11-06 LAB — GLUCOSE, CAPILLARY: GLUCOSE-CAPILLARY: 388 mg/dL — AB (ref 65–99)

## 2017-11-06 MED ORDER — BENZONATATE 100 MG PO CAPS: 100.0000 mg | ORAL_CAPSULE | Freq: Three times a day (TID) | ORAL | 1 refills | Status: DC

## 2017-11-06 NOTE — Assessment & Plan Note (Addendum)
HPI: Patient reports nasal congestion and cough x1 week.  States her niece and partner were sick with a URI a few weeks ago and she is now experiencing the same symptoms.  States her cough is productive of minimal sputum.  Denies fever, chills, headache, sore throat, shortness of breath, chest pain, and myalgias.  She feels like she is starting to get better, but states her cough gets worse at night and would like something for this.  Here in clinic she is afebrile and has no increased work of breathing on room air.  Appears comfortable from respiratory standpoint.  She does have significant diffuse wheezing throughout on lung exam.  States she has a history of asthma and was previously on albuterol inhaler but stopped using it.  States she was diagnosed with a pneumonia last year and was given antibiotics for this.  There is no note in the chart documenting this.   A: Suspect this is a viral URI that could be exacerbating her asthma given upper respiratory symptoms and positive sick contacts with same illness 2 weeks ago.  She does have significant expiratory wheezes on exam and is a smoker, but no history of COPD. She is afebrile and satting 100% on room air.  Low suspicion for pneumonia at this time therefore will not order chest imaging or prescribe antibiotic therapy.  However, if patient does not improve within 1-2 weeks would consider this as well as lab work.  P:  - Sample of albuterol inhaler provided.  Instructed patient how to use it. - Tessalon Perles prescribed - Advised patient to call if symptoms do not improve within a week  - Encourage smoking cessation

## 2017-11-06 NOTE — Patient Instructions (Signed)
It was nice to see you today.  You likely have a viral illness.  We will give you an albuterol inhaler and Tessalon Perles for your cough.  Start taking albuterol, 2 puffs twice a day.  If you feel like you short of breath you can try 2 puffs during today as well.  I gave you 2 samples of Levemir pens.  Please take 20 units at night.  If you have any issues with this please call clinic and let us know.  I sent a prescription for the Tessalon Perles to your pharmacy.  Please schedule an appointment with Dr. Maudie Mercury (pharmacist) to assist you with obtaining medications.   Please return in 1 month.

## 2017-11-06 NOTE — Progress Notes (Signed)
    CC: Cough and T2DM follow up   HPI:  Tina [name] is a 47 y.o. with PMH listed below who presents to clinic with cough and for diabetes follow-up.  Please see problem based assessment and plan for further details.  Past Medical History:  Diagnosis Date  . Diabetes mellitus    type II  . Fibroids   . HTN (hypertension)   . Hyperlipidemia   . Iron deficiency anemia   . Left acetabular fracture ([place])   . Lumbar strain   . Obesity   . Ovarian cyst    Review of Systems:   Review of Systems  Constitutional: Positive for malaise/fatigue. Negative for chills, fever and weight loss.  HENT: Positive for congestion. Negative for ear discharge, ear pain, sinus pain, sore throat and tinnitus.   Eyes: Negative for discharge and redness.  Respiratory: Positive for cough, sputum production and wheezing.   Cardiovascular: Negative for chest pain and palpitations.  Genitourinary: Negative for dysuria, frequency and urgency.  Musculoskeletal: Negative for myalgias.  Neurological: Negative for dizziness and headaches.     Physical Exam:  Vitals:   11/06/17 1609  BP: (!) 148/79  Pulse: 87  Temp: 98.3 F (36.8 C)  TempSrc: Oral  SpO2: 100%  Weight: 260 lb 12.8 oz (118.3 kg)  Height: 6\' 4"  (1.93 m)   General: Pleasant female, obese, well-developed, in no acute distress HENT: NCAT, neck supple and FROM, no lymphadenopathy, OP clear without exudates or erythema, poor dentition  Cardiac: regular rate and rhythm, nl S1/S2, no murmurs, rubs or gallops  Pulm: Diffuse expiratory wheezes throughout, no increased work of breathing on room air   Assessment & Plan:   See Encounters Tab for problem based charting.  Patient discussed with Dr. Dareen Piano

## 2017-11-06 NOTE — Assessment & Plan Note (Signed)
Patient seen on 06/2017 and switched from Levemir to 20 nightly to p.o. cortisone due to being unable to afford insulin. She is currently on glipizide 5 BID, metformin 1000 BID and pioglitazone 30 QD and compliant with medication.  A1c today is >14 ang CBD 380s.  Denies headache, changes in vision, dysuria, polydipsia, or polyuria.  Discussed need to resume insulin given uncontrolled hyperglycemia.  Unfortunately, patient was terminated from her job due to health issues and is now uninsured.  Currently applying for Medicare.  She was provided with 2 samples of Levemir from clinic she is to use 20 units at night.  Will message Dr. Maudie Mercury to set up an appointment for optimization of medication regimen.  Advised patient to continue taking metformin and glipizide at home.   - 2 Levemir pen samples provided.  Patient states she knows how to use the pen.  Advised to call clinic if she has any issues with the medication. - Continue metformin and glipizide - Will send message to staff to schedule appointment with Dr. Maudie Mercury - Return in 1 month

## 2017-11-09 NOTE — Progress Notes (Signed)
Internal Medicine Clinic Attending  Case discussed with Dr. Santos-Sanchez at the time of the visit.  We reviewed the resident's history and exam and pertinent patient test results.  I agree with the assessment, diagnosis, and plan of care documented in the resident's note.    

## 2017-11-11 ENCOUNTER — Ambulatory Visit: Payer: Self-pay | Admitting: Pharmacist

## 2017-11-17 ENCOUNTER — Ambulatory Visit: Payer: Self-pay | Admitting: Pharmacist

## 2017-11-17 ENCOUNTER — Telehealth: Payer: Self-pay | Admitting: *Deleted

## 2017-11-17 VITALS — BP 144/82 | HR 79 | Temp 98.3°F

## 2017-11-17 DIAGNOSIS — E119 Type 2 diabetes mellitus without complications: Secondary | ICD-10-CM

## 2017-11-17 NOTE — Telephone Encounter (Signed)
Pt's here to see Tina [name]. Stated she saw her doctor 1/4; given Tessalon perles and albuterol inhaler but has not helped. Continues to cough, headaches. Informed by her doctor to call back if meds did not help. Offered to be seen in [facility] since she was here; but no available appts today. Please advise.  Thanks

## 2017-11-17 NOTE — Progress Notes (Signed)
S: Tina [name] is a 47 y.o. female reports to clinical pharmacist appointment for diabetes medication management.  Allergies  Allergen Reactions  . Codeine Other (See Comments)    hallucinations   Medication Sig  ACCU-CHEK FASTCLIX LANCETS MISC Use to test blood glucose 2 times daily. Dx:E11.9  benzonatate (TESSALON PERLES) 100 MG capsule Take 1 capsule (100 mg total) by mouth 3 (three) times daily.  Blood Glucose Monitoring Suppl (ACCU-CHEK GUIDE) w/Device KIT 1 kit by Does not apply route 2 (two) times daily. Dx E11.9  collagenase (SANTYL) ointment Apply 1 application topically daily.  diclofenac sodium (VOLTAREN) 1 % GEL Apply 2 g topically 4 (four) times daily.  enalapril (VASOTEC) 10 MG tablet Take 1 tablet (10 mg total) by mouth daily.  fluticasone (FLONASE ALLERGY RELIEF) 50 MCG/ACT nasal spray Place 1 spray into both nostrils daily.  glipiZIDE (GLUCOTROL) 5 MG tablet Take 1 tablet (5 mg total) by mouth 2 (two) times daily.  glucose blood (ACCU-CHEK GUIDE) test strip Use to test blood glucose 2 times daily. Dx:E11.9  insulin detemir (LEVEMIR) 100 UNIT/ML injection Inject 0.2 mLs (20 Units total) into the skin at bedtime.  loratadine (CLARITIN) 10 MG tablet Take 1 tablet (10 mg total) by mouth daily.  metFORMIN (GLUCOPHAGE) 1000 MG tablet Take 1 tablet (1,000 mg total) by mouth 2 (two) times daily with a meal.  Multiple Vitamins-Minerals (MULTIVITAMIN WITH MINERALS) tablet Take 1 tablet by mouth daily.    oxyCODONE-acetaminophen (PERCOCET) 10-325 MG tablet Take 1 tablet by mouth every 8 (eight) hours as needed for pain. MAXIMUM TOTAL ACETAMINOPHEN DOSE IS 4000 MG PER DAY  pioglitazone (ACTOS) 30 MG tablet Take 1 tablet (30 mg total) by mouth daily.  pravastatin (PRAVACHOL) 40 MG tablet Take 1 tablet (40 mg total) by mouth every evening.  pregabalin (LYRICA) 75 MG capsule Take 1 capsule (75 mg total) by mouth 2 (two) times daily.   Past Medical History:  Diagnosis Date  .  Diabetes mellitus    type II  . Fibroids   . HTN (hypertension)   . Hyperlipidemia   . Iron deficiency anemia   . Left acetabular fracture ([facility])   . Lumbar strain   . Obesity   . Ovarian cyst    Social History   Socioeconomic History  . Marital status: Single    Spouse name: Not on file  . Number of children: 0  . Years of education: 12  . Highest education level: Not on file  Social Needs  . Financial resource strain: Not on file  . Food insecurity - worry: Not on file  . Food insecurity - inability: Not on file  . Transportation needs - medical: Not on file  . Transportation needs - non-medical: Not on file  Occupational History  . Occupation: salesperson    Employer: UNEMPLOYED    Employer: [employer]  Tobacco Use  . Smoking status: Current Every Day Smoker    Packs/day: 0.50    Years: 23.00    Pack years: 11.50    Types: Cigarettes  . Smokeless tobacco: Never Used  . Tobacco comment: 1/2PPD  Substance and Sexual Activity  . Alcohol use: Yes    Alcohol/week: 0.0 oz    Comment: occ  . Drug use: No  . Sexual activity: Yes    Birth control/protection: Injection  Other Topics Concern  . Not on file  Social History Narrative   Homosexual; in a monogamous relationship w/ homosexual woman.   No smoking, no drugs, no   alcohol   Family History  Problem Relation Age of Onset  . Colon cancer Sister   . Diabetes Mother   . Diabetes Father   . Diabetes Brother   . Celiac disease Paternal Aunt    O:    Component Value Date/Time   CHOL 211 (H) 01/21/2016 1442   HDL 28 (L) 01/21/2016 1442   TRIG 342 (H) 01/21/2016 1442   AST 11 05/25/2017 1457   ALT 15 05/25/2017 1457   NA 139 05/25/2017 1457   NA 142 08/29/2016 1500   K 4.6 05/25/2017 1457   CL 104 05/25/2017 1457   CO2 26 05/25/2017 1457   GLUCOSE 112 (H) 05/25/2017 1457   HGBA1C >14.0 11/06/2017 1611   HGBA1C 8.6 (H) 05/25/2017 1457   BUN 13 05/25/2017 1457   BUN 13 08/29/2016 1500   CREATININE 0.69  05/25/2017 1457   CALCIUM 9.2 05/25/2017 1457   GFRNONAA >89 05/25/2017 1457   GFRAA >89 05/25/2017 1457   WBC 10.7 05/25/2017 1457   HGB 13.0 05/25/2017 1457   HGB 13.1 08/29/2016 1500   HCT 39.1 05/25/2017 1457   HCT 38.3 08/29/2016 1500   PLT 445 (H) 05/25/2017 1457   PLT 464 (H) 08/29/2016 1500   TSH 1.074 06/27/2011 1627   Ht Readings from Last 2 Encounters:  11/06/17 6' 4" (1.93 m)  06/29/17 6' 4" (1.93 m)   Wt Readings from Last 2 Encounters:  11/06/17 260 lb 12.8 oz (118.3 kg)  09/22/17 263 lb 12.8 oz (119.7 kg)   There is no height or weight on file to calculate BMI. BP Readings from Last 3 Encounters:  11/17/17 (!) 144/82  11/06/17 (!) 148/79  10/18/17 134/69    A/P:  Fasting BG today 199, patient reports taking levemir insulin, pioglitazone, metformin, and glipizide  BP slightly elevated, however patient states she has not taken enalapril yet today and also has a URI  Otherwise, patient requests help with medication access due to change in status to uninsured and no longer working. Referred patient to [facility] pharmacy, will follow up with PCP. Lantus sample provided today.   Advised patient to schedule a follow up PCP appointment.  An after visit summary was provided and patient advised to follow up if any changes in condition or questions regarding medications arise. The patient verbalized understanding of information provided by repeating back concepts discussed.

## 2017-11-19 LAB — HM DIABETES EYE EXAM

## 2017-11-19 NOTE — Telephone Encounter (Signed)
Called pt - no answer; left to message to give [place] a call back; see how's she's doing and if she needs to schedule an appt.

## 2017-11-19 NOTE — Telephone Encounter (Signed)
Called pt, she cannot come to clinic before tues 1/22, appt given for 0845

## 2017-11-19 NOTE — Telephone Encounter (Signed)
Review of her records reveal that she was felt to have a viral URI as the cause of her acute cough.  Persistence for a few weeks is not unusual in this scenario.   That said, if the provider and patient remain concerned, and she was instructed to inform the clinic about a persistence in the cough, she will need to be reassessed in the clinic to assure we are not missing something else.  Of note, if the cough becomes chronic (>8 weeks) further assessment with imaging, stopping ACEI, and assessing for asthma more formally may be warranted.  As her PCP does not have any openings in the next few days she should be offered a [facility] appointment that best fits her schedule.  This does not appear to be a medical emergency, thus it is up to the patient as to what is the best appointment day and time for her.

## 2017-11-19 NOTE — Telephone Encounter (Signed)
Thanks Bonnita Nasuti and Parnell! I called her yesterday, but no response. Last time I saw her I explained to her this cough can lasts for several weeks and advised to return to clinic for re-evaluation if she felt it was worsening.

## 2017-11-20 ENCOUNTER — Ambulatory Visit (INDEPENDENT_AMBULATORY_CARE_PROVIDER_SITE_OTHER): Payer: Self-pay | Admitting: Podiatry

## 2017-11-20 DIAGNOSIS — L97511 Non-pressure chronic ulcer of other part of right foot limited to breakdown of skin: Secondary | ICD-10-CM

## 2017-11-20 DIAGNOSIS — L84 Corns and callosities: Secondary | ICD-10-CM

## 2017-11-20 DIAGNOSIS — G629 Polyneuropathy, unspecified: Secondary | ICD-10-CM

## 2017-11-20 DIAGNOSIS — E1149 Type 2 diabetes mellitus with other diabetic neurological complication: Secondary | ICD-10-CM

## 2017-11-23 NOTE — Progress Notes (Signed)
Subjective: Tina [name] presents the office today for follow-up evaluation of a wound to the right foot as well as for painful calluses on the left foot.  Overall she states the wound is healing but she still gets pain in the ankle joint on the right side.  Also the callus is the left-sided becoming painful.  She does wear open toed shoes and she has difficulty wearing shoes as it puts pressure on the wound.  She also states that she cannot stand for long period of time because of this and because of this she has not returned back to work.  She denies any drainage or redness or any swelling to her feet.  She has no other concerns today.  Objective: AAO x3, NAD DP/PT pulses palpable bilaterally, CRT less than 3 seconds Significant decrease in medial arch upon weightbearing.   On the early ulceration to the medial aspect of the right foot there is no significant skin breakdown and it does appear that it is healing.  There is still tenderness palpation of the area and the skin is coming in.  There is no probing, undermining or tunneling.  There is no swelling erythema, ascending cellulitis.  There is no fluctuation or crepitation.  There is no malodor.  There is tenderness to the anterior medial aspect of the ankle joint.  The area of tenderness is away from the area of the ulceration. On the left side there is hyperkeratotic tissue to the medial second toe at the DIPJ as well as the plantar heel.  Upon debridement there is no underlying ulceration, drainage or any signs of infection noted to the left foot.  There is tenderness of the second toe on the hyperkeratotic lesion as well as the calluses on the heel. 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  -Wound appears to be healing.  Continue with  collagen silver dressing changes daily.  Offloading.  There is no signs of infection we will continue to monitor closely. -Sharply debrided the hyperkeratotic lesions on the left foot x3 without any complications or bleeding.  Offloading.  Moisturizer to the area daily. -Again discussed that long-term she will likely benefit from an AFO ankle brace however she currently does not have insurance. -Because she states that she could not wear shoes and cannot stand for long period of time I would extend her disability for 6 weeks.  I did call [name] at Ms. Lesser's request at [phone] on the day of her appointment.  I was able to reach [facility] so I did leave a detailed message.  I never got a call back. -Follow-up as scheduled or sooner if needed.  Call any questions or concerns meantime.  She agrees this plan.  [name] DPM

## 2017-11-24 ENCOUNTER — Ambulatory Visit (INDEPENDENT_AMBULATORY_CARE_PROVIDER_SITE_OTHER): Payer: Self-pay | Admitting: Internal Medicine

## 2017-11-24 ENCOUNTER — Encounter: Payer: Self-pay | Admitting: Internal Medicine

## 2017-11-24 ENCOUNTER — Encounter: Payer: Self-pay | Admitting: *Deleted

## 2017-11-24 ENCOUNTER — Other Ambulatory Visit: Payer: Self-pay

## 2017-11-24 VITALS — BP 154/70 | HR 82 | Temp 97.8°F | Ht 76.0 in | Wt 264.0 lb

## 2017-11-24 DIAGNOSIS — R0981 Nasal congestion: Secondary | ICD-10-CM

## 2017-11-24 DIAGNOSIS — R51 Headache: Secondary | ICD-10-CM

## 2017-11-24 DIAGNOSIS — R059 Cough, unspecified: Secondary | ICD-10-CM

## 2017-11-24 DIAGNOSIS — D509 Iron deficiency anemia, unspecified: Secondary | ICD-10-CM

## 2017-11-24 DIAGNOSIS — R05 Cough: Secondary | ICD-10-CM

## 2017-11-24 DIAGNOSIS — F17210 Nicotine dependence, cigarettes, uncomplicated: Secondary | ICD-10-CM

## 2017-11-24 DIAGNOSIS — R0989 Other specified symptoms and signs involving the circulatory and respiratory systems: Secondary | ICD-10-CM

## 2017-11-24 DIAGNOSIS — I10 Essential (primary) hypertension: Secondary | ICD-10-CM

## 2017-11-24 DIAGNOSIS — E785 Hyperlipidemia, unspecified: Secondary | ICD-10-CM

## 2017-11-24 DIAGNOSIS — E119 Type 2 diabetes mellitus without complications: Secondary | ICD-10-CM

## 2017-11-24 MED ORDER — AZITHROMYCIN 250 MG PO TABS: ORAL_TABLET | ORAL | 0 refills | Status: DC

## 2017-11-24 NOTE — Progress Notes (Signed)
   CC: cough  HPI:  Tina [name] is a 47 y.o. with a PMH of T2DM, HTN, HLD, Fe-deficiency anemia presenting to clinic for cough.  Patient seen 1/4 for 1wk h/o cough thought to be 2/2 URI; she was also noted to have wheezing thought to be 2/2 exacerbation of asthma symptom due to the URI. She was treated symptomatically with albuterol, tessalon perles, and loratidine. Today patient states that her cough has been accompanied productive again, she has new symptoms of sinus congestion and sinus headache which were not present before. She denies fevers or chills, nausea, vomiting, diarrhea, myalgias. She has been using albuterol for wheezing about twice a day which she states has improved, however she is having some rhonchorous breathing at night.  Please see problem based Assessment and Plan for status of patients chronic conditions.  Past Medical History:  Diagnosis Date  . Diabetes mellitus    type II  . Fibroids   . HTN (hypertension)   . Hyperlipidemia   . Iron deficiency anemia   . Left acetabular fracture ([place])   . Lumbar strain   . Obesity   . Ovarian cyst     Review of Systems:   ROS per HPI  Physical Exam:  Vitals:   11/24/17 0904  BP: (!) 154/70  Pulse: 82  Temp: 97.8 F (36.6 C)  TempSrc: Oral  SpO2: 100%  Weight: 264 lb (119.7 kg)  Height: 6\' 4"  (1.93 m)   GENERAL- alert, co-operative, appears as stated age, not in any distress. HEENT- oral mucosa appears moist, pharyngeal erythema,  CARDIAC- RRR, no murmurs, rubs or gallops. RESP- rhonchorous breath sounds throughout, no focal fine rales, no wheezes. PSYCH- Normal mood and affect, appropriate thought content and speech.  Assessment & Plan:   See Encounters Tab for problem based charting.   Patient discussed with Dr. Gerrit Friends, MD Internal Medicine PGY2

## 2017-11-24 NOTE — Patient Instructions (Signed)
Take azithromycin 500mg  (2 tabs) today and then one tab for the next 4 days afterwards.  Continue taking loratidine, and the decongestant Dr. Maudie Mercury gave you.   For your sinuses, start using nasal irrigation (neti pot or similar). While your sinuses are acting up use it twice a day, and then 1-2 times per week at all other times to keep your sinuses clear.  You can ask your pharmacist to point you to the supplies, and below is the mixture you can make at home to use for the sinus irrigation.  BUFFERED ISOTONIC SALINE NASAL IRRIGATION  The Benefits:  1. When you irrigate, the isotonic saline (salt water) acts as a solvent and washes the mucus crusts and other debris from your nose.  2. This decongests and improves the airflow into your nose. The sinus passages begin to open.  3. Studies have also shown that a salt water and an alkaline (baking soda) irrigation solution improves nasal membrane cell function (mucociliary flow of mucus debris).  The Recipe:  1. Choose a 1-quart glass jar that is thoroughly cleansed.  2. Fill with sterile or distilled water, or you can boil water from the tap.  3. Add 1 to 2 heaping teaspoons of "pickling/canning/sea" salt (NOT table salt as it contains a large number of additives). This salt is available at the grocery store in the food canning section.  4. Add 1 teaspoon of [employer] Hammer Baking Soda (pure bicarbonate).  5. Mix ingredients together and store at room temperature. Discard after one week. If you find this solution too strong, you may decrease the amount of salt added to 1 to 1  teaspoons. With children it is often best to start with a milder solution and advance slowly. Irrigate with 240 ml (8 oz) twice daily.  The Instructions:  You should plan to irrigate your nose with buffered isotonic saline 2 times per day. Many people prefer to warm the solution slightly in the microwave - but be sure that the solution is NOT HOT. Stand over the sink  (some do this in the shower) and squirt the solution into each side of your nose, keeping your mouth open. This allows you to spit the saltwater out of your mouth. It will not harm you if you swallow a little.  If you have been told to use a nasal steroid such as Flonase, Nasonex, or Nasacort, you should always use isortonic saline solution first, then use your nasal steroid product. The nasal steroid is much more effective when sprayed onto clean nasal membranes and the steroid medicine will reach deeper into the nose.  Most people experience a little burning sensation the first few times they use a isotonic saline solution, but this usually goes away within a few days.

## 2017-11-24 NOTE — Assessment & Plan Note (Signed)
Patient with ~4 wks of cough that has gotten newly productive, and with new sinus congestion and sinus headaches; on exam she has resolution of wheezing, however has diffuse rhonchi.  This is concerning for atypical pneumonia. No focal rales or decreased breath sounds to indicate lobar pneumonia.  Plan: --Z pack --patient to continue BP neutral decongestant (sample given by Dr. Maudie Mercury last week) --continue antihistamine --advised to start nasal irrigation for her sinus symptoms 2/day acutely and then 1-2 per week for maintenance as she indicated year round sinus issues.

## 2017-11-25 ENCOUNTER — Other Ambulatory Visit: Payer: Self-pay | Admitting: Pharmacist

## 2017-11-25 DIAGNOSIS — R05 Cough: Secondary | ICD-10-CM

## 2017-11-25 DIAGNOSIS — R059 Cough, unspecified: Secondary | ICD-10-CM

## 2017-11-25 MED ORDER — AZITHROMYCIN 250 MG PO TABS: ORAL_TABLET | ORAL | 0 refills | Status: DC

## 2017-11-25 MED FILL — AZITHROMYCIN 250 MG TABLET: 250 | 5 days supply | Qty: 6 | Fill #0

## 2017-11-25 NOTE — Progress Notes (Signed)
Internal Medicine Clinic Attending  Case discussed with Dr. Svalina  at the time of the visit.  We reviewed the resident's history and exam and pertinent patient test results.  I agree with the assessment, diagnosis, and plan of care documented in the resident's note.  

## 2017-11-25 NOTE — Progress Notes (Signed)
Patient requested a transfer on Zpack, prescription sent to [facility]

## 2017-12-11 ENCOUNTER — Ambulatory Visit (INDEPENDENT_AMBULATORY_CARE_PROVIDER_SITE_OTHER): Payer: Self-pay | Admitting: Podiatry

## 2017-12-11 ENCOUNTER — Encounter: Payer: Self-pay | Admitting: Podiatry

## 2017-12-11 DIAGNOSIS — L97511 Non-pressure chronic ulcer of other part of right foot limited to breakdown of skin: Secondary | ICD-10-CM

## 2017-12-11 DIAGNOSIS — E1149 Type 2 diabetes mellitus with other diabetic neurological complication: Secondary | ICD-10-CM

## 2017-12-11 DIAGNOSIS — L84 Corns and callosities: Secondary | ICD-10-CM

## 2017-12-14 ENCOUNTER — Other Ambulatory Visit: Payer: Self-pay | Admitting: Pharmacist

## 2017-12-14 DIAGNOSIS — E119 Type 2 diabetes mellitus without complications: Secondary | ICD-10-CM

## 2017-12-14 MED ORDER — PIOGLITAZONE HCL 30 MG PO TABS: 30.0000 mg | ORAL_TABLET | Freq: Every day | ORAL | 6 refills | Status: DC

## 2017-12-14 MED FILL — PIOGLITAZONE HCL 30 MG TAB: 30 | 30 days supply | Qty: 30 | Fill #0

## 2017-12-15 NOTE — Progress Notes (Signed)
Subjective: Tina [name] presents the office today for follow-up evaluation of a wound to the right foot as well as for painful calluses on the left foot.  Overall she states that she is doing better to the wound.  She still gets pain to the ankle joint on the right side she also has pain to a callus on the left second toe.  She has not yet returned to regular shoes and she is to wear an open toed shoe.  She denies any swelling or redness or any drainage to her feet.  She has no new concerns today.  She has not yet returned back to work as she cannot wear regular shoe she states.  Overall she has no other concerns today.  She denies any systemic complaints such as fevers, chills, nausea, vomiting.  Denies any calf pain, chest pain, shortness of breath.  Objective: AAO x3, NAD DP/PT pulses palpable bilaterally, CRT less than 3 seconds Significant decrease in medial arch upon weightbearing.   Decreased medial arch upon weightbearing.  There is tenderness on the anterior medial aspect of the right ankle joint.  The wound to the also aspect of the right foot is healed.  On the medial aspect of the right foot continues to be a healing ulceration.  There is no drainage or pus expressed there is no fluctuation or crepitation.  There is no malodor.  Along the medial aspect of the left second toe on the DIPJ there is a hyperkeratotic lesion.  Upon debridement there is no underlying ulceration, drainage or any clinical signs of infection noted today.  Hyperkeratotic lesions to the left heel.  No other open lesions or pre-ulcerative lesions. 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  -Wound appears to be healing.  Continue with collagen silver dressing changes daily.  Offloading.  There is no signs of  infection we will continue to monitor closely. -Sharply debrided the hyperkeratotic lesions on the left foot x3 (heel x 2 and toe) without any complications or bleeding.  Offloading.  Moisturizer to the area daily. -Again discussed that long-term she will likely benefit from an AFO ankle brace however she currently does not have insurance. -Follow-up as scheduled or sooner if needed.  Call any questions or concerns meantime.  She agrees this plan.  [name] DPM

## 2017-12-17 ENCOUNTER — Other Ambulatory Visit: Payer: Self-pay | Admitting: Internal Medicine

## 2017-12-30 ENCOUNTER — Encounter: Payer: Self-pay | Admitting: Internal Medicine

## 2018-01-01 ENCOUNTER — Ambulatory Visit (INDEPENDENT_AMBULATORY_CARE_PROVIDER_SITE_OTHER): Payer: Self-pay | Admitting: Podiatry

## 2018-01-01 ENCOUNTER — Encounter: Payer: Self-pay | Admitting: Podiatry

## 2018-01-01 DIAGNOSIS — M19071 Primary osteoarthritis, right ankle and foot: Secondary | ICD-10-CM

## 2018-01-01 DIAGNOSIS — E1149 Type 2 diabetes mellitus with other diabetic neurological complication: Secondary | ICD-10-CM

## 2018-01-01 DIAGNOSIS — L97511 Non-pressure chronic ulcer of other part of right foot limited to breakdown of skin: Secondary | ICD-10-CM

## 2018-01-04 ENCOUNTER — Telehealth: Payer: Self-pay | Admitting: *Deleted

## 2018-01-04 NOTE — Telephone Encounter (Signed)
[place] states she need clinicals supporting pt's need to be out of work an additional 6 weeks Claim #[id number].

## 2018-01-05 NOTE — Progress Notes (Signed)
Subjective: Ms. Fanton presents the office today for follow-up evaluation of a wound to the right foot as well as for painful calluses on the left foot.  She states that the wound in the right medial foot is doing much better but she still has quite a bit of pain to the ankle.  She denies any drainage or pus coming from the wound on the right side.  She still gets a thick calluses to the left foot.  Because of the calluses and the pain to her ankle she cannot wear regular shoe when she is wearing a flip-flop.  Because of this she has not been able to return back to work.  She denies any recent injury or trauma.  She has no other concerns today. She denies any systemic complaints such as fevers, chills, nausea, vomiting.  Denies any calf pain, chest pain, shortness of breath.  Objective: AAO x3, NAD DP/PT pulses palpable bilaterally, CRT less than 3 seconds Significant decrease in medial arch upon weightbearing.   Decreased medial arch upon weightbearing.  There is tenderness on the anterior medial aspect of the right ankle joint which remains and there is minimal swelling to the area but no erythema or increase in warmth.  There is a small scab overlying the wound to the medial aspect the right ankle.  There is decreased tenderness palpation of this area there is no surrounding erythema, ascending cellulitis.  There is no fluctuation or crepitation.  There is no malodor.  No clinical signs of infection noted. On the left foot there is significant HAV present as well as hammertoe contracture there is a hyperkeratotic lesion with tenderness palpation on the medial aspect of the second digit DIPJ.  No underlying ulceration, drainage or any signs of infection noted today.  No other areas of tenderness identified bilaterally. No other open lesions or pre-ulcerative lesions identified today.      Assessment: Healing ulceration right foot; pre-ulcerative callus left foot; no signs of  infection  Plan: -All treatment options discussed with the patient including all alternatives, risks, complications.  -Wound appears to be healing.  Continue with collagen silver dressing changes daily to ensure healing but it is doing better.  Offloading.  There is no signs of infection we will continue to monitor closely. -Sharply debrided the hyperkeratotic lesion left foot without any complications or bleeding. -Long-term she is likely need a brace of the right ankle as well as an orthotic on the left side however she does not have insurance currently so it is difficult for her to get this and she did not want to pay for out-of-pocket.  Because she cannot wear regular shoe she cannot go back to work today, extend her disability for another 6 weeks.  She can return to a sitdown job wearing open toed shoe with able however her job is apparently not can accommodate this. Monitor for any clinical signs or symptoms of infection and directed to call the office immediately should any occur or go to the ER. -RTC 2 weeks or sooner if needed.  [name] DPM

## 2018-01-05 NOTE — Telephone Encounter (Signed)
Note is complete. I did call her last week when Justiss was in the office and left a message to extend it for another 6 weeks. I have her contact information if needed.

## 2018-01-09 ENCOUNTER — Encounter: Payer: Self-pay | Admitting: Gastroenterology

## 2018-01-12 ENCOUNTER — Ambulatory Visit: Payer: Self-pay | Admitting: Pharmacist

## 2018-01-12 DIAGNOSIS — I10 Essential (primary) hypertension: Secondary | ICD-10-CM

## 2018-01-12 DIAGNOSIS — E119 Type 2 diabetes mellitus without complications: Secondary | ICD-10-CM

## 2018-01-12 MED ORDER — ENALAPRIL MALEATE 10 MG PO TABS: 10.0000 mg | ORAL_TABLET | Freq: Every day | ORAL | 0 refills | Status: DC

## 2018-01-12 MED ORDER — GLIPIZIDE-METFORMIN HCL 2.5-500 MG PO TABS: 2.0000 | ORAL_TABLET | Freq: Two times a day (BID) | ORAL | 0 refills | Status: DC

## 2018-01-12 MED FILL — ENALAPRIL MALEATE 10 MG TAB: 10 | 30 days supply | Qty: 30 | Fill #0

## 2018-01-12 MED FILL — GLIPIZIDE-METFORMIN 2.5-500: 2.5-500 | 30 days supply | Qty: 120 | Fill #0

## 2018-01-12 NOTE — Progress Notes (Signed)
Patient requested help with access to enalapril and glipizide-metformin. 1-time prescriptions sent. Patient awaiting enrollment into [facility] [facility] (sitagliptin also on formulary if interested). Note sent to front desk to schedule PCP appointment.

## 2018-01-21 ENCOUNTER — Ambulatory Visit (INDEPENDENT_AMBULATORY_CARE_PROVIDER_SITE_OTHER): Payer: Self-pay | Admitting: Podiatry

## 2018-01-21 ENCOUNTER — Encounter: Payer: Self-pay | Admitting: Podiatry

## 2018-01-21 DIAGNOSIS — G8929 Other chronic pain: Secondary | ICD-10-CM

## 2018-01-21 DIAGNOSIS — L84 Corns and callosities: Secondary | ICD-10-CM

## 2018-01-21 DIAGNOSIS — M79673 Pain in unspecified foot: Secondary | ICD-10-CM

## 2018-01-21 DIAGNOSIS — E1149 Type 2 diabetes mellitus with other diabetic neurological complication: Secondary | ICD-10-CM

## 2018-01-21 DIAGNOSIS — L97511 Non-pressure chronic ulcer of other part of right foot limited to breakdown of skin: Secondary | ICD-10-CM

## 2018-01-21 NOTE — Progress Notes (Signed)
Subjective: Tina [name] presents to the office today today for follow-up evaluation of a wound to the right foot.  She states the wound is healed but she still gets some sharp pain to the area.  She states in general both of her feet have been hurting quite a bit over the last 2 weeks.  She states that her mom has been in the hospital she is doing a lot more walking than she normally is used to doing and she thinks this is what has caused her increasing foot pain and ankle pain on the right side.  She says that her toes are also hurting on the left foot.  She denies any recent injury or trauma she denies any swelling or redness.  She denies any drainage or any open sores.  She has no other concerns today. Denies any systemic complaints such as fevers, chills, nausea, vomiting. No acute changes since last appointment, and no other complaints at this time.   Objective: AAO x3, NAD DP/PT pulses palpable bilaterally, CRT less than 3 seconds Along the medial aspect the right foot the wound appears to be healed and there is no skin breakdown present.  Also the wound to the lateral aspect of the foot is healed there is no wound on the right side.  On the left foot there is a hyperkeratotic lesion to the medial second DIPJ.  Upon debridement there is no underlying ulceration, drainage or any signs of infection present.  There are a few small punctate annular hyperkeratotic lesions to the left heel.  Upon debridement there is no underlying ulceration drainage or any signs of infection.  Significant decrease in medial arch upon weightbearing bilaterally.  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  Tenderness to the right anterior medial ankle joint line.  Tenderness also with a hyperkeratotic lesion on the left second toe. No open lesions or pre-ulcerative lesions.  No pain with calf compression, swelling, warmth, erythema  Assessment: Healed ulceration right foot with pre-ulcerative calluses left  foot with bilateral chronic foot pain  Plan: -All treatment options discussed with the patient including all alternatives, risks, complications.  -The wound on the right foot appears to be healed at this time that she still gets pain there is tenderness most of the ankle joint.  She would benefit from a brace however she does not have insurance.  Also on the left foot I sharply debrided the hyperkeratotic lesions x3 without any complications or bleeding.  Discussed wearing more supportive shoes she is wearing a flip-flop.  Especially discussed this as she is doing a lot more walking.  I try to put her into an ankle brace on the right side today but this puts pressure over the area where she had the wound and she did not want to have this done.  Because she cannot wear regular shoe and she has difficulty walking or standing for long periods of time I continue to recommend that she stay out of work until she is able to get back into a shoe.  [name] DPM  -Patient encouraged to call the office with any questions, concerns, change in symptoms.

## 2018-01-25 ENCOUNTER — Other Ambulatory Visit: Payer: Self-pay

## 2018-01-25 ENCOUNTER — Other Ambulatory Visit: Payer: Self-pay | Admitting: *Deleted

## 2018-01-25 ENCOUNTER — Ambulatory Visit (INDEPENDENT_AMBULATORY_CARE_PROVIDER_SITE_OTHER): Payer: Self-pay | Admitting: Internal Medicine

## 2018-01-25 VITALS — BP 131/53 | HR 90 | Temp 98.9°F | Ht 76.0 in | Wt 288.1 lb

## 2018-01-25 DIAGNOSIS — E119 Type 2 diabetes mellitus without complications: Secondary | ICD-10-CM

## 2018-01-25 DIAGNOSIS — Z7984 Long term (current) use of oral hypoglycemic drugs: Secondary | ICD-10-CM

## 2018-01-25 LAB — GLUCOSE, CAPILLARY: Glucose-Capillary: 99 mg/dL (ref 65–99)

## 2018-01-25 LAB — POCT GLYCOSYLATED HEMOGLOBIN (HGB A1C): HEMOGLOBIN A1C: 7.7

## 2018-01-25 MED ORDER — CONTOUR NEXT ONE KIT: 1.0000 | PACK | 0 refills | Status: DC

## 2018-01-25 MED ORDER — SITAGLIPTIN-METFORMIN HCL 50-1000 MG PO TABS
1.0000 | ORAL_TABLET | Freq: Two times a day (BID) | ORAL | 3 refills | Status: DC
Start: 2018-01-25 — End: 2018-09-09

## 2018-01-25 MED FILL — CONTOUR NEXT ONE KIT: 30 days supply | Qty: 1 | Fill #0

## 2018-01-25 MED FILL — JANUMET 50-1,000 MG TABLET: 50-1000 | 30 days supply | Qty: 60 | Fill #0

## 2018-01-25 NOTE — Assessment & Plan Note (Signed)
Lab Results  Component Value Date   HGBA1C 7.7 01/25/2018   HGBA1C >14.0 11/06/2017   HGBA1C 8.6 (H) 05/25/2017     Assessment: Progress toward A1C goal:   Improved Comments: Current regimen includes glipizide 5 mg twice daily, metformin 1000 mg twice daily, Pioglitazone 30 mg daily, and Levemir 20 units at bedtime.  Patient is currently uninsured. States Butch Penny gave her information for [employer] meter but she is not able to pay for it at this time.  Reports having hypoglycemic symptoms such as sweating, trembling which improve after she eats glucose tablets.  States this happens on average 1-2 times per week.  Plan: Medications: [facility] meter and testing supplies to the patient since they cost $4 each and might be a cheaper option.  She told me she has to wait for her check which comes in 2 weeks and until then will not be able to check her blood sugar at home.  As such, decision has been made to stop Levemir and glipizide at this time since her A1c is much improved.  Continue pioglitazone.  Start Janumet 50-1000 mg twice daily.  Advised her to pick up a glucose meter and testing supplies from the pharmacy when she has her paycheck as I would still like her to monitor her blood glucose to get an idea of whether her CBGs are above goal.  In that case, it would be reasonable to restart Levemir at a lower dose. Instruction/counseling given: discussed the need for weight loss and discussed diet Other plans: Return to the clinic in 3 months for A1c check.

## 2018-01-25 NOTE — Progress Notes (Signed)
   CC: Diabetes follow-up  HPI:  [name] is a 47 y.o. female with a past medical history of conditions listed below presenting to the clinic for a follow-up of diabetes. Please see problem based charting for the status of the patient's current and chronic medical conditions.   Past Medical History:  Diagnosis Date  . Diabetes mellitus    type II  . Fibroids   . HTN (hypertension)   . Hyperlipidemia   . Iron deficiency anemia   . Left acetabular fracture ([place])   . Lumbar strain   . Obesity   . Ovarian cyst    Review of Systems: Pertinent positives mentioned in HPI. Remainder of all ROS negative.   Physical Exam:  Vitals:   01/25/18 0857  BP: (!) 131/53  Pulse: 90  Temp: 98.9 F (37.2 C)  TempSrc: Oral  SpO2: 100%  Weight: 288 lb 1.6 oz (130.7 kg)  Height: 6\' 4"  (1.93 m)   Physical Exam  Constitutional: She is oriented to person, place, and time. She appears well-developed and well-nourished. No distress.  HENT:  Mouth/Throat: Oropharynx is clear and moist.  Eyes: Right eye exhibits no discharge. Left eye exhibits no discharge.  Cardiovascular: Normal rate, regular rhythm and intact distal pulses.  Pulmonary/Chest: Effort normal and breath sounds normal. No respiratory distress.  Abdominal: Soft. Bowel sounds are normal. She exhibits no distension. There is no tenderness.  Musculoskeletal: She exhibits no edema.  Neurological: She is alert and oriented to person, place, and time.  Skin: Skin is warm and dry.    Assessment & Plan:   See Encounters Tab for problem based charting.  Patient discussed with Dr. Lynnae January

## 2018-01-25 NOTE — Addendum Note (Signed)
Addended by: Forde Dandy on: 01/25/2018 08:56 AM   Modules accepted: Orders

## 2018-01-25 NOTE — Patient Instructions (Addendum)
Tina [name] it was nice meeting you today.  -STOP using Levemir  -STOP using Glipizide  -Start taking Sitagliptin-Metformin as instructed  -Continue taking Pioglitazone as instructed    -Check your blood glucose daily before breakfast or whenever you feel sweaty, shaky.   -Return for a follow-up in 3 months. Please bring your medications and meter to the visit.

## 2018-01-25 NOTE — Addendum Note (Signed)
Addended by: Forde Dandy on: 01/25/2018 08:55 AM   Modules accepted: Orders

## 2018-01-26 NOTE — Progress Notes (Signed)
Internal Medicine Clinic Attending  Case discussed with Dr. Marlowe Sax at the time of the visit.  We reviewed the resident's history and exam and pertinent patient test results.  I agree with the assessment, diagnosis, and plan of care documented in the resident's note. Pt did not sch F/U appt. Will send to front desk to sch DM F/U in 3 months.

## 2018-01-28 MED ORDER — GLUCOSE BLOOD VI STRP: ORAL_STRIP | 12 refills | Status: DC

## 2018-01-28 MED ORDER — FREESTYLE LANCETS MISC: 12 refills | Status: DC

## 2018-01-28 MED FILL — PIOGLITAZONE HCL 30 MG TAB: 30 | 30 days supply | Qty: 30 | Fill #1

## 2018-02-04 IMAGING — CR DG FOOT COMPLETE 3+V*R*
3 series · 3 of 3 positions shown · non-contrast
Comparison: August 10, 2015

CLINICAL DATA: Diabetic ulcer medial right foot

EXAM:
RIGHT FOOT COMPLETE - 3+ VIEW

[foot ap]
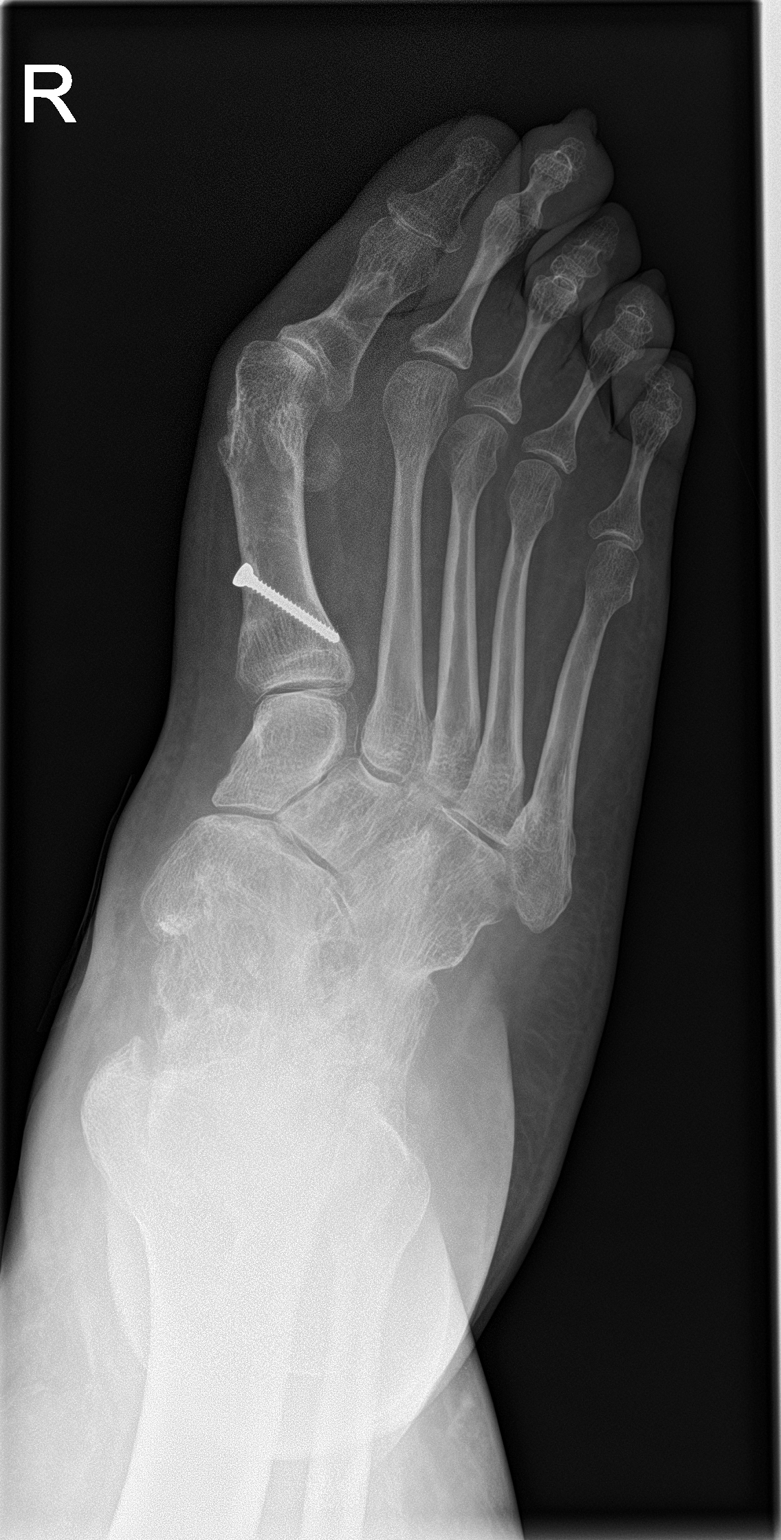

[foot obl]
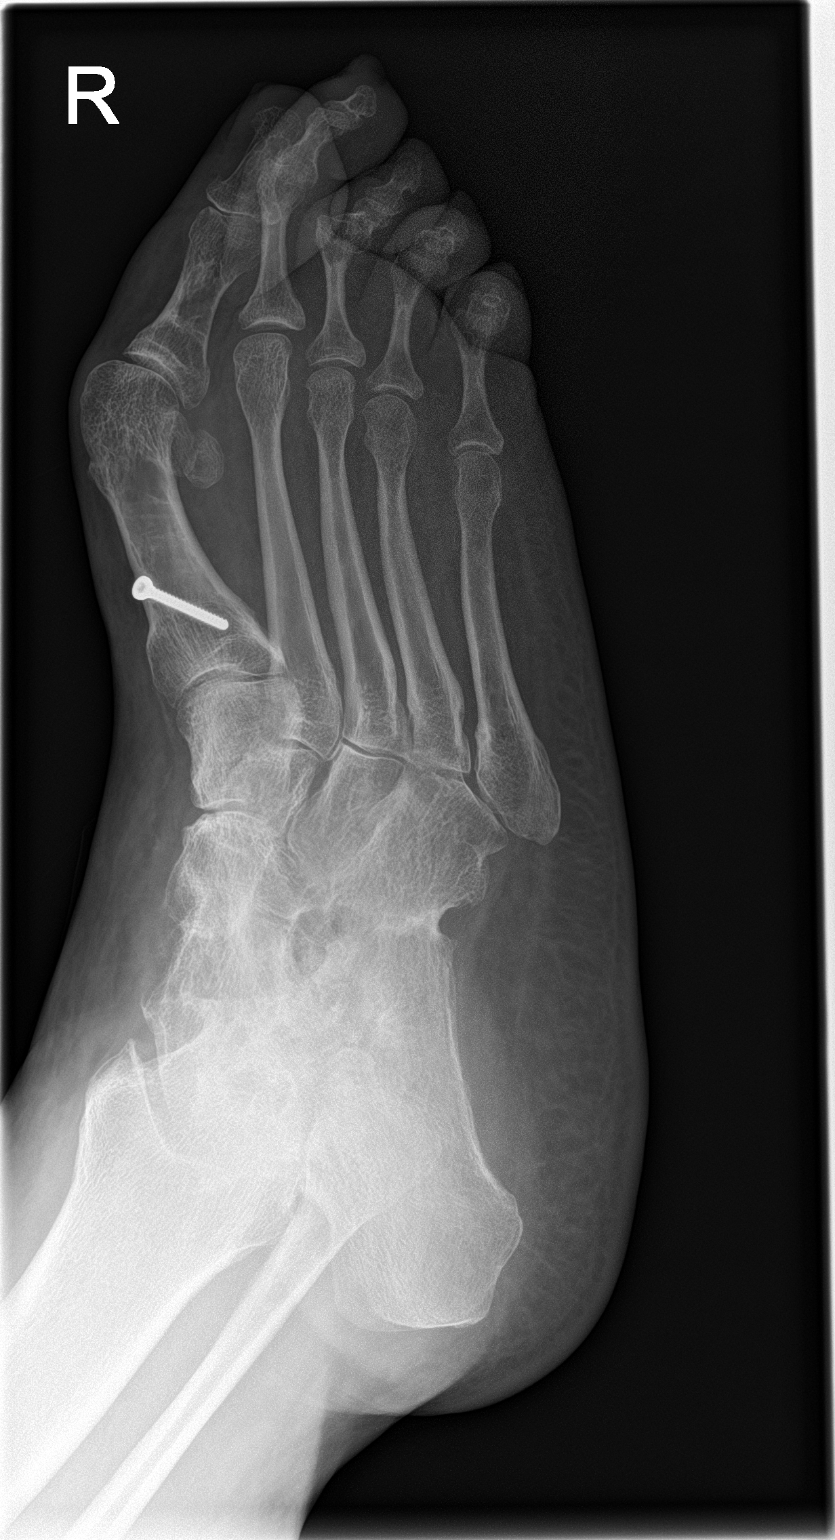

[foot lat]
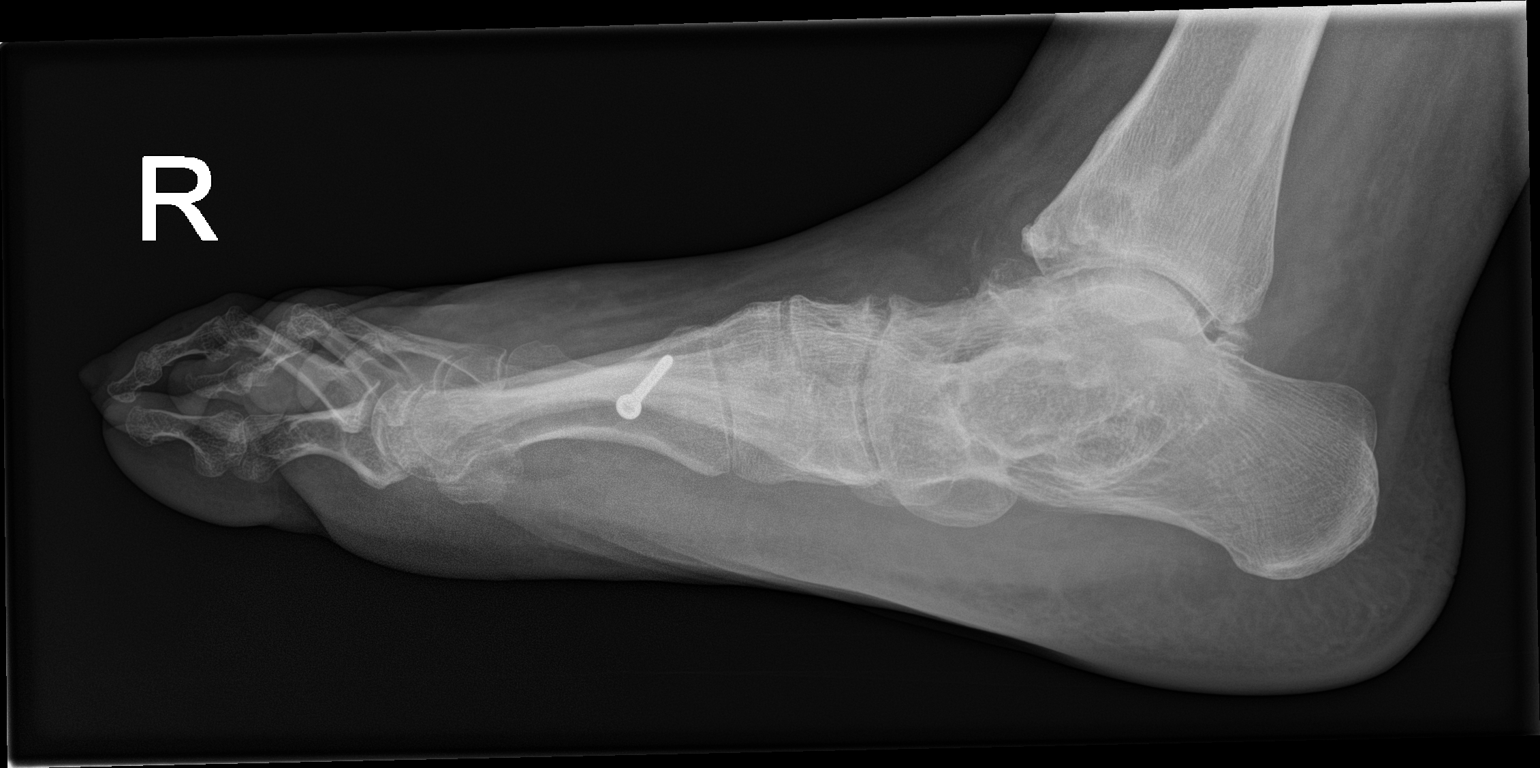

[3 of 3 positions shown; findings below may reference images not displayed]

FINDINGS: Frontal, oblique, and lateral views obtained. There is soft tissue
swelling diffusely, most notably over the dorsal midfoot and over
the metatarsals. There is no acute fracture or dislocation. A
surgical screw is noted in the proximal first metatarsal. There is
evidence of previous bunionectomy. There is hallux valgus deformity
at the first MTP joint. There is mild narrowing of all PIP and DIP
joints as well as the first MTP joint. There is evidence of diffuse
hindfoot fusion with fusion of the subtalar joints. There is also
talonavicular and calcaneal cuboid fusion. There is extensive
osteoarthritic change in the ankle joint. There is pes planus. There
is no erosive change or demonstrable bony destruction. No soft
tissue air appreciable.
IMPRESSION: Diffuse soft tissue swelling. No soft tissue air or overt soft
tissue abscess seen by radiography. No erosive change or bony
destruction.

Extensive hindfoot fusion, stable. Pes planus. Screw in proximal
first metatarsal, stable. Status post bunionectomy with hallux
valgus deformity at the first MTP joint. Osteoarthritic change in
multiple distal joints. No erosive change. There is extensive
osteoarthritic change in the ankle joint.

MR would be the imaging study of choice for further assessment for
potential soft tissue abscess or potential osteomyelitis.

## 2018-02-15 ENCOUNTER — Telehealth: Payer: Self-pay | Admitting: Dietician

## 2018-02-15 ENCOUNTER — Encounter: Payer: Self-pay | Admitting: Dietician

## 2018-02-15 NOTE — Telephone Encounter (Signed)
I was asked to call patient about: Did she get meter? yes What are CBGs? She reports they are 90-120 both times a day that she checks them . 8-9 am, 7 pm highest was 140 mg/dl

## 2018-02-16 NOTE — Telephone Encounter (Signed)
Thank you for the information, Butch Penny!

## 2018-02-18 ENCOUNTER — Other Ambulatory Visit: Payer: Self-pay | Admitting: Pharmacist

## 2018-02-18 ENCOUNTER — Ambulatory Visit (INDEPENDENT_AMBULATORY_CARE_PROVIDER_SITE_OTHER): Payer: Self-pay | Admitting: Podiatry

## 2018-02-18 ENCOUNTER — Encounter: Payer: Self-pay | Admitting: Podiatry

## 2018-02-18 DIAGNOSIS — M79673 Pain in unspecified foot: Secondary | ICD-10-CM

## 2018-02-18 DIAGNOSIS — I10 Essential (primary) hypertension: Secondary | ICD-10-CM

## 2018-02-18 DIAGNOSIS — E1149 Type 2 diabetes mellitus with other diabetic neurological complication: Secondary | ICD-10-CM

## 2018-02-18 DIAGNOSIS — L97511 Non-pressure chronic ulcer of other part of right foot limited to breakdown of skin: Secondary | ICD-10-CM

## 2018-02-18 DIAGNOSIS — G8929 Other chronic pain: Secondary | ICD-10-CM

## 2018-02-18 MED ORDER — GABAPENTIN 300 MG PO CAPS: 300.0000 mg | ORAL_CAPSULE | Freq: Three times a day (TID) | ORAL | 3 refills | Status: DC

## 2018-02-18 NOTE — Patient Instructions (Signed)
Gabapentin capsules or tablets What is this medicine? GABAPENTIN (GA ba pen tin) is used to control partial seizures in adults with epilepsy. It is also used to treat certain types of nerve pain. This medicine may be used for other purposes; ask your health care provider or pharmacist if you have questions. COMMON BRAND NAME(S): Active-PAC with Gabapentin, Gabarone, Neurontin What should I tell my health care provider before I take this medicine? They need to know if you have any of these conditions: -kidney disease -suicidal thoughts, plans, or attempt; a previous suicide attempt by you or a family member -an unusual or allergic reaction to gabapentin, other medicines, foods, dyes, or preservatives -pregnant or trying to get pregnant -breast-feeding How should I use this medicine? Take this medicine by mouth with a glass of water. Follow the directions on the prescription label. You can take it with or without food. If it upsets your stomach, take it with food.Take your medicine at regular intervals. Do not take it more often than directed. Do not stop taking except on your doctor's advice. If you are directed to break the 600 or 800 mg tablets in half as part of your dose, the extra half tablet should be used for the next dose. If you have not used the extra half tablet within 28 days, it should be thrown away. A special MedGuide will be given to you by the pharmacist with each prescription and refill. Be sure to read this information carefully each time. Talk to your pediatrician regarding the use of this medicine in children. Special care may be needed. Overdosage: If you think you have taken too much of this medicine contact a poison control center or emergency room at once. NOTE: This medicine is only for you. Do not share this medicine with others. What if I miss a dose? If you miss a dose, take it as soon as you can. If it is almost time for your next dose, take only that dose. Do not  take double or extra doses. What may interact with this medicine? Do not take this medicine with any of the following medications: -other gabapentin products This medicine may also interact with the following medications: -alcohol -antacids -antihistamines for allergy, cough and cold -certain medicines for anxiety or sleep -certain medicines for depression or psychotic disturbances -homatropine; hydrocodone -naproxen -narcotic medicines (opiates) for pain -phenothiazines like chlorpromazine, mesoridazine, prochlorperazine, thioridazine This list may not describe all possible interactions. Give your health care provider a list of all the medicines, herbs, non-prescription drugs, or dietary supplements you use. Also tell them if you smoke, drink alcohol, or use illegal drugs. Some items may interact with your medicine. What should I watch for while using this medicine? Visit your doctor or health care professional for regular checks on your progress. You may want to keep a record at home of how you feel your condition is responding to treatment. You may want to share this information with your doctor or health care professional at each visit. You should contact your doctor or health care professional if your seizures get worse or if you have any new types of seizures. Do not stop taking this medicine or any of your seizure medicines unless instructed by your doctor or health care professional. Stopping your medicine suddenly can increase your seizures or their severity. Wear a medical identification bracelet or chain if you are taking this medicine for seizures, and carry a card that lists all your medications. You may get drowsy, dizzy,   or have blurred vision. Do not drive, use machinery, or do anything that needs mental alertness until you know how this medicine affects you. To reduce dizzy or fainting spells, do not sit or stand up quickly, especially if you are an older patient. Alcohol can  increase drowsiness and dizziness. Avoid alcoholic drinks. Your mouth may get dry. Chewing sugarless gum or sucking hard candy, and drinking plenty of water will help. The use of this medicine may increase the chance of suicidal thoughts or actions. Pay special attention to how you are responding while on this medicine. Any worsening of mood, or thoughts of suicide or dying should be reported to your health care professional right away. Women who become pregnant while using this medicine may enroll in the [place] American Antiepileptic Drug Pregnancy Registry by calling 1-888-233-2334. This registry collects information about the safety of antiepileptic drug use during pregnancy. What side effects may I notice from receiving this medicine? Side effects that you should report to your doctor or health care professional as soon as possible: -allergic reactions like skin rash, itching or hives, swelling of the face, lips, or tongue -worsening of mood, thoughts or actions of suicide or dying Side effects that usually do not require medical attention (report to your doctor or health care professional if they continue or are bothersome): -constipation -difficulty walking or controlling muscle movements -dizziness -nausea -slurred speech -tiredness -tremors -weight gain This list may not describe all possible side effects. Call your doctor for medical advice about side effects. You may report side effects to FDA at 1-800-FDA-1088. Where should I keep my medicine? Keep out of reach of children. This medicine may cause accidental overdose and death if it taken by other adults, children, or pets. Mix any unused medicine with a substance like cat litter or coffee grounds. Then throw the medicine away in a sealed container like a sealed bag or a coffee can with a lid. Do not use the medicine after the expiration date. Store at room temperature between 15 and 30 degrees C (59 and 86 degrees F). NOTE: This  sheet is a summary. It may not cover all possible information. If you have questions about this medicine, talk to your doctor, pharmacist, or health care provider.  2018 Elsevier/Gold Standard (2013-12-16 15:26:50)  

## 2018-02-19 ENCOUNTER — Telehealth: Payer: Self-pay | Admitting: Internal Medicine

## 2018-02-19 ENCOUNTER — Other Ambulatory Visit: Payer: Self-pay | Admitting: Internal Medicine

## 2018-02-19 DIAGNOSIS — I10 Essential (primary) hypertension: Secondary | ICD-10-CM

## 2018-02-19 MED ORDER — ENALAPRIL MALEATE 10 MG PO TABS: 10.0000 mg | ORAL_TABLET | Freq: Every day | ORAL | 3 refills | Status: DC

## 2018-02-19 MED FILL — ENALAPRIL MALEATE 10 MG TAB: 10 | 30 days supply | Qty: 30 | Fill #0

## 2018-02-19 NOTE — Telephone Encounter (Signed)
   Reason for call:   I received a call from [name] at 8:12 AM indicating she needs a refill on enalapril.   Pertinent Data:   Patient states she take Enalapril 10 mg daily for HTN.    Enalapril 10 mg daily was refilled by her PCP today and sent to [facility]. Patient prefers [facility].   No other complaints.    Assessment / Plan / Recommendations:   Medication has been sent to [facility].   As always, pt is advised that if symptoms worsen or new symptoms arise, they should go to an urgent care facility or to to ER for further evaluation.   Tina Leff, MD   02/19/2018, 8:37 AM

## 2018-02-22 ENCOUNTER — Telehealth: Payer: Self-pay | Admitting: *Deleted

## 2018-02-22 MED ORDER — GABAPENTIN 300 MG PO CAPS: 300.0000 mg | ORAL_CAPSULE | Freq: Three times a day (TID) | ORAL | 3 refills | Status: DC

## 2018-02-22 MED FILL — GABAPENTIN 300 MG CAPSULE: 300 | 30 days supply | Qty: 90 | Fill #0

## 2018-02-22 NOTE — Progress Notes (Signed)
Subjective: Capri presents to the office today today for follow-up evaluation of a wound to the right foot.  She states the area is getting better.  She still but the pain is on the area daily but there is still tender but is getting much better.  She denies any drainage to the area.  She does feel that the wound is healed however it is new skin which is still causing some pain she also continues to get pain to the right ankle as well as chronic pain to the left foot.  She has calluses to the palms of her heel as well as the second toe due to the digital contractures.  Likely more burning pain to her feet.  She was on gabapentin previously but she has stopped this due to lack of insurance and she has not followed up with her pain management doctor.  Denies any systemic complaints such as fevers, chills, nausea, vomiting. No acute changes since last appointment, and no other complaints at this time.   Objective: AAO x3, NAD DP/PT pulses palpable bilaterally, CRT less than 3 seconds Along the medial aspect the right foot the wound appears to be healed and there is no skin breakdown present.  Overall the area is getting better.  There is a small scab type area on the central aspect of the heel wound and there is new skin present on the periphery.  There is no drainage or pus.  There is no surrounding erythema, ascending cellulitis.  There is no fluctuation or crepitation.  There is no malodor.  There is no clinical signs of infection noted. Significant flatfoot deformity is present bilaterally.  There is tenderness of the anterior medial aspect of the right ankle.  On the left foot there is hammertoe contracture as well as bunion deformity with associated hyperkeratotic lesions to the medial second toe as well as the plantar heel.  Upon debridement there is no underlying ulceration, drainage or any clinical signs of infection noted today. No open lesions or pre-ulcerative lesions.  No pain with calf  compression, swelling, warmth, erythema  Assessment: Healed ulceration right foot with pre-ulcerative calluses left foot with bilateral chronic foot pain  Plan: -All treatment options discussed with the patient including all alternatives, risks, complications.  -She is to be healed and is getting more mature I did try to do bracing into the area but as it rubs on this area causes quite a bit discomfort for her she cannot do this.  Also she cannot wear regular shoe because of the wound is wearing open toed shoe.  Because she cannot wear closed in shoe she cannot return to work.  Because this will not extend her disability for another 6 weeks.  Long-term she is going need a [place] type brace however she does not have insurance at this point either. -Given her neuropathy pain on the restart gabapentin.  Will do 300 mg 3 times a day.  She was given this by pain management previously but she is no longer going to the lack of insurance -I debrided the hyperkeratotic lesions on the left foot without any complications or bleeding -Monitor for any clinical signs or symptoms of infection and directed to call the office immediately should any occur or go to the ER. -RTC 4 weeks or sooner if needed  [name] DPM

## 2018-03-01 MED FILL — PIOGLITAZONE HCL 30 MG TAB: 30 | 30 days supply | Qty: 30 | Fill #2

## 2018-03-05 NOTE — Telephone Encounter (Signed)
Entered in error

## 2018-03-12 MED FILL — JANUMET 50-1,000 MG TABLET: 50-1000 | 30 days supply | Qty: 60 | Fill #1

## 2018-03-22 ENCOUNTER — Ambulatory Visit: Payer: MEDICAID | Admitting: Podiatry

## 2018-03-25 ENCOUNTER — Ambulatory Visit (INDEPENDENT_AMBULATORY_CARE_PROVIDER_SITE_OTHER): Payer: MEDICAID | Admitting: Podiatry

## 2018-03-25 ENCOUNTER — Encounter: Payer: Self-pay | Admitting: Podiatry

## 2018-03-25 DIAGNOSIS — M79673 Pain in unspecified foot: Secondary | ICD-10-CM

## 2018-03-25 DIAGNOSIS — E1149 Type 2 diabetes mellitus with other diabetic neurological complication: Secondary | ICD-10-CM

## 2018-03-25 DIAGNOSIS — L84 Corns and callosities: Secondary | ICD-10-CM

## 2018-03-25 DIAGNOSIS — G8929 Other chronic pain: Secondary | ICD-10-CM

## 2018-03-25 MED ORDER — GABAPENTIN 600 MG PO TABS: 600.0000 mg | ORAL_TABLET | Freq: Three times a day (TID) | ORAL | 3 refills | Status: DC

## 2018-03-25 NOTE — Progress Notes (Signed)
Subjective: Tina [name] presents to the office today today for follow-up evaluation of a wound to the right foot as well as her bilateral foot pain.  She was using gabapentin 300 mg 3 times a day which is been helping some but she still gets pain to her feet.  She states that the wound on the right foot is still painful as well as the calluses on her left foot as well as the second toe and bunion.  No recent injury or trauma.  In the last saw her she was to go to [place] vacation to see a family member graduate.  She has no other concerns.  Denies any systemic complaints such as fevers, chills, nausea, vomiting. No acute changes since last appointment, and no other complaints at this time.   Objective: AAO x3, NAD DP/PT pulses palpable bilaterally, CRT less than 3 seconds Along the medial aspect the right foot the wound appears to be healed and there is no skin breakdown present.  There is new skin present and there is no skin breakdown however there is still tenderness palpation of this area.  The area to the lateral aspect the foot is also well-healed.  There is no other areas of open sores or pre-ulcerative calluses in the right foot.   On the left foot there is hammertoe contracture as well as bunion deformity with associated hyperkeratotic lesions to the medial second toe as well as the plantar heel.  Upon debridement there is no underlying ulceration, drainage or any clinical signs of infection noted today. No open lesions or pre-ulcerative lesions.  No pain with calf compression, swelling, warmth, erythema  Assessment: Healed ulceration right foot with pre-ulcerative calluses left foot with bilateral chronic foot pain  Plan: -All treatment options discussed with the patient including all alternatives, risks, complications.  -We will increase her gabapentin to 600 mg 3 times a day.  She was on this dose previously and tolerated well. -The wound on the right foot appears to be healed however still  tender she is having difficulty putting pressure to the area at times. -Sharply debrided all the hyperkeratotic lesions in the left foot without any complications. -At this point I want her to try to get back into a regular shoe we discussed try to wear more supportive shoe.  Discussed trying to get measured and fitted to the proper shoe. -Because she is not able to wear she will extend her disability for another 6 weeks.  [name] DPM

## 2018-03-26 ENCOUNTER — Encounter: Payer: Self-pay | Admitting: Podiatry

## 2018-03-26 NOTE — Progress Notes (Signed)
Called [place] her caseworker to give update on her progress. Will continue to stay out for another 6 weeks but she is progressing and starting to work on getting back into a shoe

## 2018-03-30 ENCOUNTER — Telehealth: Payer: Self-pay | Admitting: Podiatry

## 2018-03-30 MED ORDER — GABAPENTIN 600 MG PO TABS: 600.0000 mg | ORAL_TABLET | Freq: Three times a day (TID) | ORAL | 3 refills | Status: DC

## 2018-03-30 MED FILL — ENALAPRIL MALEATE 10 MG TAB: 10 | 30 days supply | Qty: 30 | Fill #1

## 2018-03-30 NOTE — Addendum Note (Signed)
Addended by: Harriett Sine D on: 03/30/2018 09:42 AM   Modules accepted: Orders

## 2018-03-30 NOTE — Telephone Encounter (Signed)
I informed pt her rx had been sent to the [facility].

## 2018-03-30 NOTE — Telephone Encounter (Signed)
I saw Dr. Jacqualyn Posey last week and he sent a prescription for 600 mg gabapentin to [facility] [name]. I cannot afford that as it is $90.00 there. I have been getting my medicine at [facility]. Could you send the 600 mg gabapentin to them? That number is [phone]. Can you call me back and let me know when you do that? My number is [phone]. Thank you.

## 2018-04-06 ENCOUNTER — Other Ambulatory Visit: Payer: Self-pay | Admitting: Pharmacist

## 2018-04-06 ENCOUNTER — Telehealth: Payer: Self-pay | Admitting: Pharmacist

## 2018-04-06 DIAGNOSIS — M5116 Intervertebral disc disorders with radiculopathy, lumbar region: Secondary | ICD-10-CM

## 2018-04-06 NOTE — Telephone Encounter (Signed)
Pt called to give the names of the medication Sertraline 10 mg and Lamotrigine 100 mg.  Pt would also like a call back.

## 2018-04-07 MED ORDER — GABAPENTIN 600 MG PO TABS: 600.0000 mg | ORAL_TABLET | Freq: Three times a day (TID) | ORAL | 3 refills | Status: DC

## 2018-04-07 MED FILL — GABAPENTIN 600 MG TAB: 600 | 30 days supply | Qty: 90 | Fill #0

## 2018-04-07 NOTE — Telephone Encounter (Signed)
Hi Dr. Maudie Mercury, that is ok with me. She has been dealing with a diabetic foot ulcer for months and has been unable to work since then because she cannot wear appropriate shoes for work so she has been struggling to afford medications. I do not have a request for the prescription in my in basket though. Would you like me to send as a new prescription?

## 2018-04-07 NOTE — Telephone Encounter (Signed)
Thank you :)

## 2018-04-07 NOTE — Telephone Encounter (Signed)
No problem---I sent the prescription. Thank you!

## 2018-04-09 MED FILL — PIOGLITAZONE HCL 30 MG TAB: 30 | 30 days supply | Qty: 30 | Fill #3

## 2018-04-13 ENCOUNTER — Encounter: Payer: Self-pay | Admitting: Internal Medicine

## 2018-04-13 ENCOUNTER — Other Ambulatory Visit: Payer: Self-pay

## 2018-04-13 ENCOUNTER — Ambulatory Visit (INDEPENDENT_AMBULATORY_CARE_PROVIDER_SITE_OTHER): Payer: Self-pay | Admitting: Internal Medicine

## 2018-04-13 ENCOUNTER — Ambulatory Visit: Payer: Self-pay | Admitting: Pharmacist

## 2018-04-13 DIAGNOSIS — E785 Hyperlipidemia, unspecified: Secondary | ICD-10-CM

## 2018-04-13 DIAGNOSIS — Z598 Other problems related to housing and economic circumstances: Secondary | ICD-10-CM

## 2018-04-13 DIAGNOSIS — Z599 Problem related to housing and economic circumstances, unspecified: Secondary | ICD-10-CM | POA: Insufficient documentation

## 2018-04-13 DIAGNOSIS — Z79899 Other long term (current) drug therapy: Secondary | ICD-10-CM

## 2018-04-13 DIAGNOSIS — I10 Essential (primary) hypertension: Secondary | ICD-10-CM

## 2018-04-13 MED ORDER — ATORVASTATIN CALCIUM 40 MG PO TABS: 40.0000 mg | ORAL_TABLET | Freq: Every day | ORAL | 2 refills | Status: DC

## 2018-04-13 MED ORDER — ENALAPRIL MALEATE 20 MG PO TABS: 20.0000 mg | ORAL_TABLET | Freq: Every day | ORAL | 2 refills | Status: DC

## 2018-04-13 NOTE — Assessment & Plan Note (Signed)
BP Readings from Last 3 Encounters:  04/13/18 (!) 141/73  01/25/18 (!) 131/53  11/24/17 (!) 154/70    Lab Results  Component Value Date   NA 139 05/25/2017   K 4.6 05/25/2017   CREATININE 0.69 05/25/2017   Blood pressure initially elevated at 160/77.  Improved to 141/73 on recheck.  Previous note from PCP 06/2017 with elevated blood pressures at that time indicated that they would increase her enalapril from 10 mg to 20 mg.  This does not appear to have taken place that she only has 10 mg on her medication list and reports taking 10 mg daily.  Denies any adverse side effects.  No chest pain, cough, shortness of breath, palpitations.  A/P We will increase her dose to enalapril 20.  Return in 6 to 8 weeks for recheck with labs.

## 2018-04-13 NOTE — Assessment & Plan Note (Signed)
Tina [name] currently sees Beverly Sessions who prescribes her Zoloft and Lamictal.  She reports that she was told there medication assistance program was being discontinued and she would no longer be able to afford those medications.  She was told to follow-up with her PCP to discuss options for obtaining these medications.  He had been working with Dr. Maudie Mercury to get into the medication assistance program.  During our visit she received a phone call from [facility] report that they would be able to continue to fill her medications for $5 per medication.  We will continue this and continue to work on getting her to the medication assistance program.

## 2018-04-13 NOTE — Progress Notes (Signed)
   CC: Medication refill & htn  HPI:  [name] is a 47 y.o. female with a past medical history listed below here today requesting medication refills and recheck on her blood pressure.  For details of today's visit and the status of her chronic medical issues please refer to the assessment and plan.   Past Medical History:  Diagnosis Date  . Diabetes mellitus    type II  . Fibroids   . HTN (hypertension)   . Hyperlipidemia   . Iron deficiency anemia   . Left acetabular fracture ([place])   . Lumbar strain   . Obesity   . Ovarian cyst    Review of Systems:   No chest pain or shortness of breath  Physical Exam:  Vitals:   04/13/18 0913 04/13/18 0937  BP: (!) 160/77 (!) 141/73  Pulse: 80 79  Temp: 98.2 F (36.8 C)   TempSrc: Oral   SpO2: 100%   Weight: 298 lb 11.2 oz (135.5 kg)   Height: 6\' 4"  (1.93 m)    GENERAL- alert, co-operative, appears as stated age, not in any distress. HEENT- Atraumatic, normocephalic CARDIAC- RRR, no murmurs, rubs or gallops. RESP- Moving equal volumes of air, and clear to auscultation bilaterally, no wheezes or crackles. PSYCH- Normal mood and affect, appropriate thought content and speech.   Assessment & Plan:   See Encounters Tab for problem based charting.  Patient discussed with Dr. Rebeca Alert

## 2018-04-13 NOTE — Assessment & Plan Note (Signed)
Patient currently on pravastatin 40 but this appears to be out of date.  She has high risk and needs high intensity statin.  We will start her on Lipitor 40 mg daily.  Will need a lipid panel in 6 to 8 weeks.

## 2018-04-13 NOTE — Patient Instructions (Signed)
[name],  I am giong to increase the dose of your enalapril to 20 mg daily. You can take 2 pills of the 10 mg dose you have at home until you run out. I want you to come back to see [place] in 6-8 weeks for recheck.  Dr. Maudie Mercury is working on getting all of your medications through the medication assistance program but in the meantime they will be $4 at the outpatient pharmacy.

## 2018-04-13 NOTE — Progress Notes (Signed)
Patient was seen today in a co-visit between the physician and pharmacist. Referred patient to [facility] pharmacy for help with medication access. Patient states she will be able to pick up lamotrigine and sertraline from [facility] while awaiting [place] medassist enrollment. Provided education on atorvastatin (10-year CV risk estimate 49%). Will continue to follow along and help in patient's care.  See documentation under Dr. Shanna Cisco visit for details.

## 2018-04-14 ENCOUNTER — Ambulatory Visit: Payer: Self-pay | Admitting: Pharmacist

## 2018-04-14 ENCOUNTER — Encounter: Payer: Self-pay | Admitting: Internal Medicine

## 2018-04-15 NOTE — Progress Notes (Signed)
Internal Medicine Clinic Attending  Case discussed with Dr. Boswell  at the time of the visit.  We reviewed the resident's history and exam and pertinent patient test results.  I agree with the assessment, diagnosis, and plan of care documented in the resident's note.  Alexander N Raines, MD   

## 2018-04-26 ENCOUNTER — Encounter: Payer: Self-pay | Admitting: Podiatry

## 2018-04-26 ENCOUNTER — Ambulatory Visit (INDEPENDENT_AMBULATORY_CARE_PROVIDER_SITE_OTHER): Payer: MEDICAID | Admitting: Podiatry

## 2018-04-26 DIAGNOSIS — M79673 Pain in unspecified foot: Secondary | ICD-10-CM

## 2018-04-26 DIAGNOSIS — L84 Corns and callosities: Secondary | ICD-10-CM

## 2018-04-26 DIAGNOSIS — G8929 Other chronic pain: Secondary | ICD-10-CM

## 2018-04-26 DIAGNOSIS — E1149 Type 2 diabetes mellitus with other diabetic neurological complication: Secondary | ICD-10-CM

## 2018-04-26 NOTE — Progress Notes (Signed)
Subjective: Jeslyn presents to the office today today for follow-up evaluation of a wound to the right foot as well as her bilateral foot pain.  She states that the right foot is healed as far as the wound but is still very tender with pressure and she cannot wear shoe.  She also feels as a catch in her right ankle when she walks.  She also has painful calluses on the left foot which are painful with pressure and shoes.  She denies any redness or drainage or any swelling.Denies any systemic complaints such as fevers, chills, nausea, vomiting. No acute changes since last appointment, and no other complaints at this time.   Objective: AAO x3, NAD DP/PT pulses palpable bilaterally, CRT less than 3 seconds On the medial aspect of the patient's right .  Foot on the area the previous wound this area is healed there is no open wound and the skin is starting to form up.  Also hyperkeratotic lesions present on the left foot along the medial second toe, submetatarsal x2 as well as the heel.  Upon debridement there is no underlying ulceration drainage or signs of infection.  There is decreased range of motion of the right ankle joint and subjectively this is where it "catches".  There is no area pinpoint bony tenderness or pain to vibratory sensation.  Hammertoe deformity as well as HAV present as well as significant flatfoot deformity. No open lesions or pre-ulcerative lesions.  No pain with calf compression, swelling, warmth, erythema  Assessment: Healed ulceration right foot with pre-ulcerative calluses left foot with bilateral chronic foot pain  Plan: -All treatment options discussed with the patient including all alternatives, risks, complications.  -Ulceration is healed the right side.  Hopefully when she gets insurance we will start to try to get her an AFO ankle brace for the ankle given her arthritis.  I sharply debrided the hyperkeratotic lesions of the left foot without any complications or bleeding.   We will continue gabapentin as prescribed this is been helpful.  She still not able to wear regular shoe although she is improving.  We will extend her out of work for another 6 weeks.  [name] DPM

## 2018-04-29 MED FILL — ENALAPRIL MALEATE 20 MG TAB: 20 | 30 days supply | Qty: 30 | Fill #0

## 2018-04-29 MED FILL — ATORVASTATIN 40 MG TABLET: 40 | 30 days supply | Qty: 30 | Fill #0

## 2018-04-30 ENCOUNTER — Encounter: Payer: Self-pay | Admitting: Internal Medicine

## 2018-05-04 MED FILL — JANUMET 50-1,000 MG TABLET: 50-1000 | 30 days supply | Qty: 60 | Fill #2

## 2018-05-18 MED FILL — PIOGLITAZONE HCL 30 MG TAB: 30 | 30 days supply | Qty: 30 | Fill #4

## 2018-06-01 ENCOUNTER — Ambulatory Visit (INDEPENDENT_AMBULATORY_CARE_PROVIDER_SITE_OTHER): Payer: MEDICAID | Admitting: Podiatry

## 2018-06-01 DIAGNOSIS — M79673 Pain in unspecified foot: Secondary | ICD-10-CM

## 2018-06-01 DIAGNOSIS — E1149 Type 2 diabetes mellitus with other diabetic neurological complication: Secondary | ICD-10-CM

## 2018-06-01 DIAGNOSIS — M19071 Primary osteoarthritis, right ankle and foot: Secondary | ICD-10-CM

## 2018-06-01 DIAGNOSIS — G8929 Other chronic pain: Secondary | ICD-10-CM

## 2018-06-02 NOTE — Progress Notes (Signed)
Subjective: Tina [name] presents to the office today today for follow-up evaluation of a wound to the right foot as well as her bilateral foot pain.  She says the right foot is getting better and the wound is healed her main concern on the right side of the joint pain to her ankle.  Left foot is unchanged given the calluses and pain and bunion deformity.  She is still working on Government social research officer.  She has no new concerns otherwise.  Unfortunate she is on long-term disability because she cannot wear shoes to work and she is having other issues including fibroid issues, back pain. Denies any systemic complaints such as fevers, chills, nausea, vomiting. No acute changes since last appointment, and no other complaints at this time.   Objective: AAO x3, NAD DP/PT pulses palpable bilaterally, CRT less than 3 seconds On the medial aspect of the patient's right foot on the area the previous wound this area is healed there is no open wound and the skin is starting to form up.  The area is getting smaller.  There is no significant pain to this area and there is no drainage or pus or increase in warmth.  On the right side majority tenderness is the anterior medial aspect of the ankle joint.  There is some discomfort with ankle joint range of motion.  Hyperkeratotic lesions present on the left foot along the medial second toe, submetatarsal x2 as well as the heel.  Upon debridement there is no underlying ulceration drainage or signs of infection.    Flatfoot deformity is present bilaterally, HAV left foot no open lesions or pre-ulcerative lesions.  No pain with calf compression, swelling, warmth, erythema  Assessment: Healed ulceration right foot with pre-ulcerative calluses left foot with bilateral chronic foot pain  Plan: -All treatment options discussed with the patient including all alternatives, risks, complications.  -Regards to her ankle pain she is continue to brace unfortunately she does not have  insurance.  Continue supportive shoes and she is been wearing flip-flops which were more support.  On the left foot I debrided the hyperkeratotic lesion without any complications or bleeding.  She will likely need a custom orthotic on the left side again we are awaiting insurance approval.  As she cannot wear shoes she is unable to go back to work she is on long-term disability.  [name] DPM

## 2018-06-09 ENCOUNTER — Encounter: Payer: Self-pay | Admitting: Podiatry

## 2018-06-11 MED FILL — ATORVASTATIN 40 MG TABLET: 40 | 30 days supply | Qty: 30 | Fill #1

## 2018-06-11 MED FILL — ENALAPRIL MALEATE 20 MG TAB: 20 | 30 days supply | Qty: 30 | Fill #1

## 2018-06-21 MED FILL — PIOGLITAZONE HCL 30 MG TAB: 30 | 30 days supply | Qty: 30 | Fill #5

## 2018-06-21 MED FILL — GABAPENTIN 600 MG TABS: 600 | 30 days supply | Qty: 90 | Fill #1

## 2018-06-21 MED FILL — JANUMET 50-1,000 MG TABLET: 50-1000 | 30 days supply | Qty: 60 | Fill #3

## 2018-06-29 ENCOUNTER — Ambulatory Visit (INDEPENDENT_AMBULATORY_CARE_PROVIDER_SITE_OTHER): Payer: MEDICAID | Admitting: Podiatry

## 2018-06-29 ENCOUNTER — Ambulatory Visit (INDEPENDENT_AMBULATORY_CARE_PROVIDER_SITE_OTHER): Payer: MEDICAID

## 2018-06-29 DIAGNOSIS — L84 Corns and callosities: Secondary | ICD-10-CM

## 2018-06-29 DIAGNOSIS — M25571 Pain in right ankle and joints of right foot: Secondary | ICD-10-CM

## 2018-06-29 DIAGNOSIS — M19071 Primary osteoarthritis, right ankle and foot: Secondary | ICD-10-CM

## 2018-06-29 DIAGNOSIS — G629 Polyneuropathy, unspecified: Secondary | ICD-10-CM

## 2018-06-29 MED ORDER — MELOXICAM 7.5 MG PO TABS: 7.5000 mg | ORAL_TABLET | Freq: Every day | ORAL | 0 refills | Status: DC

## 2018-06-29 MED FILL — MELOXICAM 7.5 MG TABLET: 7.5 | 15 days supply | Qty: 15 | Fill #0

## 2018-06-30 NOTE — Progress Notes (Signed)
Subjective: Tina [name] presents to the office today today for follow-up evaluation of a wound to the right foot as well as her bilateral foot pain.  She states that she is been having some swelling to the right ankle.  Both feet are still painful but the wound is healed on the right side.  She cannot walk for long distances because of the pain on the right ankle.  She still awaiting insurance approval.  Because of her foot pain her other issues she is unable to work.  Denies any systemic complaints such as fevers, chills, nausea, vomiting. No acute changes since last appointment, and no other complaints at this time.   Objective: AAO x3, NAD DP/PT pulses palpable bilaterally, CRT less than 3 seconds The wounds well-healed on her feet.  She has hyperkeratotic lesions on the left medial second toe as well as left heel.  The majority tenderness is along the right anterior ankle joint line and there is some swelling to this area.  There is no area pinpoint tenderness identified at this time however there is decreased range of motion of the ankle joint there is some mild discomfort in the joint range of motion.  He has trouble arthrodesis is also present on the right side.  Flatfoot deformities present bilaterally with hammertoes and bunion deformities bilaterally.  No pain with calf compression, swelling, warmth, erythema  Assessment: Healed ulceration right foot with pre-ulcerative calluses left foot with bilateral chronic foot pain; ankle arthritis  Plan: -All treatment options discussed with the patient including all alternatives, risks, complications.  -X-rays were obtained and there is significant arthritic changes present of the ankle joint on the right side.  Status post triple arthrodesis.  Bunion flatfoot deformity is present. -Given her right ankle pain she is unable to walk or stand for long periods of time.  She does not have insurance at this point therefore she is having difficulty getting a  brace to help control the arthritis.  Long-term she does need an [place] brace likely.  However because she is unable to stand she is not able to work.  This is very difficult situation for her.  Use of anti-inflammatories in the meantime.  I also debrided the hyperkeratotic lesions without any complications or bleeding. -Continue gabapentin for neuropathy. -RTC 4 weeks or sooner if needed  Tina [name] DPM

## 2018-07-02 ENCOUNTER — Telehealth: Payer: Self-pay | Admitting: Podiatry

## 2018-07-02 ENCOUNTER — Other Ambulatory Visit: Payer: Self-pay | Admitting: Podiatry

## 2018-07-02 MED ORDER — TRAMADOL HCL 50 MG PO TABS: 50.0000 mg | ORAL_TABLET | Freq: Two times a day (BID) | ORAL | 0 refills | Status: DC | PRN

## 2018-07-02 NOTE — Telephone Encounter (Signed)
[employer] - front receptionist informed pt the medication was at the pharmacy per Dr. Jacqualyn Posey.

## 2018-07-02 NOTE — Telephone Encounter (Signed)
Tramadol sent to her pharmacy. Please let her know

## 2018-07-02 NOTE — Progress Notes (Signed)
Tramadol sent. 

## 2018-07-07 ENCOUNTER — Telehealth: Payer: Self-pay | Admitting: *Deleted

## 2018-07-07 MED ORDER — TRAMADOL HCL 50 MG PO TABS: 50.0000 mg | ORAL_TABLET | Freq: Two times a day (BID) | ORAL | 0 refills | Status: DC | PRN

## 2018-07-07 NOTE — Telephone Encounter (Signed)
[facility] [phone] fax notice states due to new Opioid Laws, pt is only able to receive 5 day supply from 07/02/2018 Tramadol and pt will be calling for refill.

## 2018-07-27 ENCOUNTER — Ambulatory Visit (INDEPENDENT_AMBULATORY_CARE_PROVIDER_SITE_OTHER): Payer: MEDICAID | Admitting: Podiatry

## 2018-07-27 DIAGNOSIS — M25571 Pain in right ankle and joints of right foot: Secondary | ICD-10-CM

## 2018-07-27 DIAGNOSIS — G629 Polyneuropathy, unspecified: Secondary | ICD-10-CM

## 2018-07-27 DIAGNOSIS — L84 Corns and callosities: Secondary | ICD-10-CM

## 2018-07-27 DIAGNOSIS — E1149 Type 2 diabetes mellitus with other diabetic neurological complication: Secondary | ICD-10-CM

## 2018-07-27 DIAGNOSIS — M19071 Primary osteoarthritis, right ankle and foot: Secondary | ICD-10-CM

## 2018-07-27 MED ORDER — DICLOFENAC SODIUM 75 MG PO TBEC: 75.0000 mg | DELAYED_RELEASE_TABLET | Freq: Two times a day (BID) | ORAL | 0 refills | Status: DC

## 2018-07-27 MED FILL — DICLOFENAC SODIUM 75 MG TAB: 75 | 15 days supply | Qty: 30 | Fill #0

## 2018-07-28 NOTE — Progress Notes (Signed)
Subjective: Tina [name] presents to the office today today for follow-up evaluation of a wound to the right foot as well as her bilateral foot pain. The wound continues to be healed.  She states that she gets a catching feeling to her right ankle which caused the pain when she stands or walks for long period of time.  Also the calluses on her left foot become painful.  She has been using a moisturizer cream which is been helpful for the callus on the heel. Denies any systemic complaints such as fevers, chills, nausea, vomiting. No acute changes since last appointment, and no other complaints at this time.   Objective: AAO x3, NAD DP/PT pulses palpable bilaterally, CRT less than 3 seconds The wounds well-healed on her feet.  She has hyperkeratotic lesions on the left medial second toe as well as left heel.  She continued tenderness to the anterior aspect of the right ankle.  She describes a catching sensation when trying to walk she has difficulty walking and standing for long period of time.  Mild swelling to the ankle there is no erythema increased warmth.  Significant flatfoot deformities present bilaterally.  Bunion as well as hammertoe deformities present bilaterally. No pain with calf compression, swelling, warmth, erythema  Assessment: Healed ulceration right foot with pre-ulcerative calluses left foot with bilateral chronic foot pain; ankle arthritis  Plan: -All treatment options discussed with the patient including all alternatives, risks, complications.  -She is to get an [place] type brace for her right foot.  When check with charity care to see if we can get this covered for her. -Continue moisturizer to the calluses daily.  I debrided the hyperkeratotic lesion left foot without any complications. -Unfortunately she is not able to go back to work as she cannot wear regular shoe given the pain that she cannot stand for long periods of time.  [name] DPM

## 2018-07-29 MED FILL — ENALAPRIL MALEATE 20 MG TAB: 20 | 30 days supply | Qty: 30 | Fill #2

## 2018-07-29 MED FILL — PIOGLITAZONE HCL 30 MG TAB: 30 | 30 days supply | Qty: 30 | Fill #6

## 2018-07-29 MED FILL — ATORVASTATIN 40 MG TABLET: 40 | 30 days supply | Qty: 30 | Fill #2

## 2018-08-24 ENCOUNTER — Encounter: Payer: Self-pay | Admitting: Podiatry

## 2018-08-24 ENCOUNTER — Ambulatory Visit (INDEPENDENT_AMBULATORY_CARE_PROVIDER_SITE_OTHER): Payer: MEDICAID | Admitting: Podiatry

## 2018-08-24 DIAGNOSIS — M25571 Pain in right ankle and joints of right foot: Secondary | ICD-10-CM

## 2018-08-24 DIAGNOSIS — L84 Corns and callosities: Secondary | ICD-10-CM

## 2018-08-24 DIAGNOSIS — M19071 Primary osteoarthritis, right ankle and foot: Secondary | ICD-10-CM

## 2018-08-24 DIAGNOSIS — E1149 Type 2 diabetes mellitus with other diabetic neurological complication: Secondary | ICD-10-CM

## 2018-08-24 NOTE — Progress Notes (Signed)
Subjective: Tina [name] presents to the office today today for follow-up evaluation of a wound to the right foot as well as her bilateral foot pain.  Overall she states that she is doing about the same.  Her right ankle swells at times and she states that it catches when she walks causing joint pain.  She is scheduled to see [facility] tomorrow for brace evaluation. Denies any systemic complaints such as fevers, chills, nausea, vomiting. No acute changes since last appointment, and no other complaints at this time.   Objective: AAO x3, NAD DP/PT pulses palpable bilaterally, CRT less than 3 seconds The wounds well-healed on her feet.  Significant flatfoot deformities present bilaterally.  Previous triple arthrodesis in the right side there is tenderness and crepitation with ankle joint range of motion.  Tenderness on the anterior ankle joint line on the right side.  On the left foot the hyperkeratotic lesions of the heel is much improved.  She has a continued painful callus on the medial left second toe.  Upon debridement there is no underlying ulceration, drainage or any signs of infection.  She states that her girlfriend does try to trim them with a pumice stone as well as putting cream on the areas daily.  Assessment: Healed ulceration right foot with pre-ulcerative calluses left foot with bilateral chronic foot pain; ankle arthritis  Plan: -All treatment options discussed with the patient including all alternatives, risks, complications.  -She is to get an [place] type brace for her right foot.  When check with charity care to see if we -Debrided the hyperkeratotic lesions without any complications or bleeding. -She is scheduled to see [facility] tomorrow for possible [place] brace.  She has a history of a triple arthrodesis of the flatfoot and significant ankle arthritis.  Hopefully brace will get her back to work.  Tina [name] DPM

## 2018-08-25 ENCOUNTER — Ambulatory Visit: Payer: MEDICAID | Admitting: Orthotics

## 2018-08-25 DIAGNOSIS — M25571 Pain in right ankle and joints of right foot: Secondary | ICD-10-CM

## 2018-08-25 DIAGNOSIS — R2681 Unsteadiness on feet: Secondary | ICD-10-CM

## 2018-08-25 DIAGNOSIS — M19071 Primary osteoarthritis, right ankle and foot: Secondary | ICD-10-CM

## 2018-09-06 ENCOUNTER — Other Ambulatory Visit: Payer: Self-pay | Admitting: Pharmacist

## 2018-09-06 ENCOUNTER — Other Ambulatory Visit: Payer: Self-pay | Admitting: Podiatry

## 2018-09-06 DIAGNOSIS — E119 Type 2 diabetes mellitus without complications: Secondary | ICD-10-CM

## 2018-09-06 MED FILL — ENALAPRIL MALEATE 20 MG TAB: 20 | 30 days supply | Qty: 30 | Fill #3

## 2018-09-06 MED FILL — ATORVASTATIN 40 MG TABLET: 40 | 30 days supply | Qty: 30 | Fill #3

## 2018-09-06 MED FILL — GABAPENTIN 600 MG TABLET: 600 | 30 days supply | Qty: 90 | Fill #2

## 2018-09-07 MED FILL — PIOGLITAZONE HCL 30 MG TAB: 30 | 30 days supply | Qty: 30 | Fill #0

## 2018-09-09 ENCOUNTER — Other Ambulatory Visit: Payer: Self-pay | Admitting: *Deleted

## 2018-09-09 DIAGNOSIS — E119 Type 2 diabetes mellitus without complications: Secondary | ICD-10-CM

## 2018-09-09 MED ORDER — SITAGLIPTIN PHOS-METFORMIN HCL 50-1000 MG PO TABS: 1.0000 | ORAL_TABLET | Freq: Two times a day (BID) | ORAL | 3 refills | Status: DC

## 2018-09-10 MED FILL — JANUMET 50-1,000 MG TABLET: 50-1000 | 30 days supply | Qty: 60 | Fill #0

## 2018-09-21 ENCOUNTER — Encounter: Payer: Self-pay | Admitting: Podiatry

## 2018-09-21 ENCOUNTER — Ambulatory Visit (INDEPENDENT_AMBULATORY_CARE_PROVIDER_SITE_OTHER): Payer: MEDICAID | Admitting: Podiatry

## 2018-09-21 ENCOUNTER — Ambulatory Visit: Payer: MEDICAID | Admitting: Orthotics

## 2018-09-21 DIAGNOSIS — E1149 Type 2 diabetes mellitus with other diabetic neurological complication: Secondary | ICD-10-CM

## 2018-09-21 DIAGNOSIS — G629 Polyneuropathy, unspecified: Secondary | ICD-10-CM

## 2018-09-21 DIAGNOSIS — L84 Corns and callosities: Secondary | ICD-10-CM

## 2018-09-21 DIAGNOSIS — R2681 Unsteadiness on feet: Secondary | ICD-10-CM

## 2018-09-21 DIAGNOSIS — M19071 Primary osteoarthritis, right ankle and foot: Secondary | ICD-10-CM

## 2018-09-21 NOTE — Progress Notes (Signed)
Patient picking up brace, no charges per dr Viona Gilmore.

## 2018-09-23 NOTE — Progress Notes (Signed)
Subjective: Tina [name] presents to the office today today for follow-up evaluation of a wound to the right foot as well as her bilateral foot pain.  She also presents to pick up her [place] brace on the right side.  She gets some swelling to the right ankle still pain in the ankle and it catches when she walks.  She also has a painful callus on the left second toe.  No ulcerations identified the wound that she had previously is healed well.  She has no other concerns. Denies any systemic complaints such as fevers, chills, nausea, vomiting. No acute changes since last appointment, and no other complaints at this time.   Objective: AAO x3, NAD DP/PT pulses palpable bilaterally, CRT less than 3 seconds The wounds well-healed on her feet.  Hyperkeratotic tissue medial left second toe at the DIPJ.  No underlying ulceration, drainage or any signs of infection.  The area of the callus on the heel is much improved.  The wound on the right side is completely healed.  Arthritic changes present ankle mild edema to the area but there is no erythema or warmth.  Discomfort with ankle joint range of motion.  Significant flatfoot deformities present bilaterally.  Assessment: Healed ulceration right foot with pre-ulcerative calluses left foot with bilateral chronic foot pain; ankle arthritis  Plan: -All treatment options discussed with the patient including all alternatives, risks, complications.  -We will evaluate her today in [place] brace was dispensed. -Debrided the hyperkeratotic lesions without any complications or bleeding. -Hopefully get back into a shoe and orthotic on the left side to get her back to work.  However for now she is not able to work because she cannot wear regular shoes.  When she can wear shoe stand she can return to work otherwise she denies sitting job.  [name] DPM

## 2018-10-10 ENCOUNTER — Emergency Department (HOSPITAL_BASED_OUTPATIENT_CLINIC_OR_DEPARTMENT_OTHER)
Admission: EM | Admit: 2018-10-10 | Discharge: 2018-10-10 | Disposition: A | Discharge status: Home / Self Care | Payer: Self-pay | Attending: Emergency Medicine | Admitting: Emergency Medicine

## 2018-10-10 ENCOUNTER — Encounter (HOSPITAL_BASED_OUTPATIENT_CLINIC_OR_DEPARTMENT_OTHER): Payer: Self-pay | Admitting: Emergency Medicine

## 2018-10-10 ENCOUNTER — Emergency Department (HOSPITAL_BASED_OUTPATIENT_CLINIC_OR_DEPARTMENT_OTHER): Payer: Self-pay

## 2018-10-10 ENCOUNTER — Other Ambulatory Visit: Payer: Self-pay

## 2018-10-10 DIAGNOSIS — I10 Essential (primary) hypertension: Secondary | ICD-10-CM | POA: Insufficient documentation

## 2018-10-10 DIAGNOSIS — J208 Acute bronchitis due to other specified organisms: Secondary | ICD-10-CM | POA: Insufficient documentation

## 2018-10-10 DIAGNOSIS — E119 Type 2 diabetes mellitus without complications: Secondary | ICD-10-CM | POA: Insufficient documentation

## 2018-10-10 DIAGNOSIS — B9789 Other viral agents as the cause of diseases classified elsewhere: Secondary | ICD-10-CM | POA: Insufficient documentation

## 2018-10-10 DIAGNOSIS — Z79899 Other long term (current) drug therapy: Secondary | ICD-10-CM | POA: Insufficient documentation

## 2018-10-10 DIAGNOSIS — F17210 Nicotine dependence, cigarettes, uncomplicated: Secondary | ICD-10-CM | POA: Insufficient documentation

## 2018-10-10 DIAGNOSIS — Z7984 Long term (current) use of oral hypoglycemic drugs: Secondary | ICD-10-CM | POA: Insufficient documentation

## 2018-10-10 MED ORDER — NAPROXEN 500 MG PO TABS: 500.0000 mg | ORAL_TABLET | Freq: Two times a day (BID) | ORAL | 0 refills | Status: DC

## 2018-10-10 MED ORDER — FLUTICASONE PROPIONATE 50 MCG/ACT NA SUSP: 1.0000 | Freq: Every day | NASAL | 0 refills | Status: DC

## 2018-10-10 MED ORDER — BENZONATATE 100 MG PO CAPS: 100.0000 mg | ORAL_CAPSULE | Freq: Three times a day (TID) | ORAL | 0 refills | Status: DC

## 2018-10-10 MED ORDER — ALBUTEROL SULFATE HFA 108 (90 BASE) MCG/ACT IN AERS: 1.0000 | INHALATION_SPRAY | Freq: Four times a day (QID) | RESPIRATORY_TRACT | 0 refills | Status: DC | PRN

## 2018-10-10 NOTE — Discharge Instructions (Addendum)
You were seen in the emergency today for upper respiratory symptoms and diagnosed with bronchitis which is likely due to a virus. We have prescribed multiple medicines to help with this.   -Flonase to be used 1 spray in each nostril daily.  This medication is used to treat your congestion.  -Tessalon can be taken once every 8 hours as needed.  This medication is used to treat your cough.  - Naproxen is a nonsteroidal anti-inflammatory medication that will help with pain and swelling. Be sure to take this medication as prescribed with food, 1 pill every 12 hours,  It should be taken with food, as it can cause stomach upset, and more seriously, stomach bleeding. Do not take other nonsteroidal anti-inflammatory medications with this such as Advil, Motrin, Aleve, Mobic, Goodie Powder, or Motrin.    - Inhaler- you may use this 1-2 puffs every 6 hours for wheezing/shortness of breath.   You make take Tylenol per over the counter dosing with these medications.   We have prescribed you new medication(s) today. Discuss the medications prescribed today with your pharmacist as they can have adverse effects and interactions with your other medicines including over the counter and prescribed medications. Seek medical evaluation if you start to experience new or abnormal symptoms after taking one of these medicines, seek care immediately if you start to experience difficulty breathing, feeling of your throat closing, facial swelling, or rash as these could be indications of a more serious allergic reaction   You will need to follow-up with your primary care provider in 1 week if your symptoms have not improved.  If you do not have a primary care provider one is provided in your discharge instructions.  Return to the emergency department for any new or worsening symptoms including but not limited to persistent fever for 5 days, difficulty breathing, chest pain, rashes, passing out, or any other concerns.    Your  blood pressure is also somewhat elevated- have this rechecked by a primary care provider within 1-2 weeks.  Vitals:   10/10/18 1412  BP: (!) 129/99  Pulse: 72  Resp: 18  Temp: 99.7 F (37.6 C)  SpO2: 99%

## 2018-10-10 NOTE — ED Triage Notes (Signed)
Cough and body aches x 2 days.  

## 2018-10-10 NOTE — Progress Notes (Signed)
Patient presents today for evaluation/casting for AFO brace (r).   Patient has hx of the following conditions: Gait instability,  Ankle instabilty,  Gait analysis done and patient displays abnormality of gait in both sagittial and frontal planes, and could benefit in aggressive ankle support.  Patient chose [place] brace w/ lace/speed laces.  

## 2018-10-10 NOTE — ED Provider Notes (Signed)
[facility] EMERGENCY DEPARTMENT Provider Note   CSN: [id number] Arrival date & time: 10/10/18  1406     History   Chief Complaint Chief Complaint  Patient presents with  . Cough    HPI Tina [name] is a 47 y.o. female with a hx of tobacco abuse, DM, HTN, hyperlipidemia, anemia, and obesity who presents to the ED with complaints of URI symptoms over the past 2 days.  Patient reports congestion, rhinorrhea, and productive cough with mucus sputum.  Also has had some generalized body aches and chills, no fevers. Notes some wheezing, no dyspnea. Tried OTC medicine without much relief. Mother is sick in the hospital w/ pneumonia. Denies fever, vomiting, diarrhea, dyspnea, or chest pain.   HPI  Past Medical History:  Diagnosis Date  . Diabetes mellitus    type II  . Fibroids   . HTN (hypertension)   . Hyperlipidemia   . Iron deficiency anemia   . Left acetabular fracture ([place])   . Lumbar strain   . Obesity   . Ovarian cyst     Patient Active Problem List   Diagnosis Date Noted  . Financial difficulties 04/13/2018  . Seasonal allergies 02/06/2017  . Muscle strain 10/22/2016  . Osteoarthritis of right ankle 08/29/2016  . Tobacco abuse 01/21/2016  . Severe obesity (BMI 35.0-39.9) 10/12/2015  . Foot ulcer, right ([place]) 09/13/2015  . Back pain 05/19/2015  . Chronic constipation 02/07/2013  . Ovarian cyst 12/24/2012  . Right knee pain 12/09/2012  . Hypertension 07/20/2012  . Preventative health care 06/27/2011  . Uncomplicated type 2 diabetes mellitus ([place]) 05/30/2010  . Dyslipidemia 05/30/2010  . 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    Past Surgical History:  Procedure Laterality Date  . FOOT ARTHRODESIS, MODIFIED MCBRIDE     right foot bunion surgery and ankle surgery 1980s  . 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 Authorizing Provider  atorvastatin (LIPITOR) 40 MG tablet Take 1 tablet (40 mg total) by mouth daily. 04/13/18  Yes Maryellen Pile, MD  Blood Glucose Monitoring Suppl (CONTOUR NEXT ONE) KIT 1 each by Does not apply route See admin instructions. USE TO CHECK BLOOD GLUCOSE ONCE DAILY. IM PROGRAM. 01/25/18  Yes [name], MD  diclofenac (VOLTAREN) 75 MG EC tablet TAKE 1 TABLET BY MOUTH 2 TIMES DAILY. 09/06/18  Yes [name], DPM  diclofenac sodium (VOLTAREN) 1 % GEL Apply 2 g topically 4 (four) times daily. 08/29/16  Yes Asencion Partridge, MD  enalapril (VASOTEC) 20 MG tablet Take 1 tablet (20 mg total) by mouth daily. IMTS program. 04/13/18  Yes Maryellen Pile, MD  gabapentin (NEURONTIN) 600 MG tablet Take 1 tablet (600 mg total) by mouth 3 (three) times daily. IM program 04/07/18  Yes Santos-Sanchez, Merlene Morse, MD  lamoTRIgine (LAMICTAL) 100 MG tablet Take 100 mg by mouth daily.   Yes  [provider]  Multiple Vitamins-Minerals (MULTIVITAMIN WITH MINERALS) tablet Take 1 tablet by mouth daily.     Yes [provider]  pioglitazone (ACTOS) 30 MG tablet TAKE 1 TABLET BY MOUTH DAILY. IM PROGRAM 09/06/18  Yes Santos-Sanchez, Merlene Morse, MD  sertraline (ZOLOFT) 100 MG tablet Take 100 mg by mouth daily.   Yes [provider]  sitaGLIPtin-metformin (JANUMET) 50-1000 MG tablet Take 1 tablet by mouth 2 (two) times daily with a meal. IM program. 09/09/18  Yes Welford Roche, MD  collagenase (SANTYL) ointment Apply 1 application topically daily. 05/26/17   [name], DPM  fluticasone (FLONASE ALLERGY RELIEF) 50 MCG/ACT nasal spray Place 1 spray into both nostrils daily. Patient not taking: Reported on 07/27/2018 02/06/17   Asencion Partridge, MD  glucose blood (CONTOUR NEXT TEST) test strip  USE TO CHECK BLOOD SUGAR ONCE DAILY. Diagnosis code E11.9. IM program Patient not taking: Reported on 07/27/2018 01/28/18   [name], MD  Lancets (FREESTYLE) lancets USE TO CHECK BLOOD SUGAR ONCE DAILY. Diagnosis code E11.9. IM program Patient not taking: Reported on 07/27/2018 01/28/18   [name], MD  loratadine (CLARITIN) 10 MG tablet Take 1 tablet (10 mg total) by mouth daily. 02/06/17   Asencion Partridge, MD  meloxicam (MOBIC) 7.5 MG tablet Take 1 tablet (7.5 mg total) by mouth daily. 06/29/18   [name], DPM  traMADol (ULTRAM) 50 MG tablet Take 1 tablet (50 mg total) by mouth every 12 (twelve) hours as needed. 07/02/18   [name], DPM    Family History Family History  Problem Relation Age of Onset  . Colon cancer Sister   . Diabetes Mother   . Diabetes Father   . Diabetes Brother   . Celiac disease Paternal Aunt     Social History Social History   Tobacco Use  . Smoking status: Current Every Day Smoker    Packs/day: 0.50    Years: 23.00    Pack years: 11.50    Types: Cigarettes  . Smokeless tobacco: Never Used  . Tobacco comment: 1/2PPD  Substance Use Topics  . Alcohol use: Yes    Alcohol/week: 0.0 standard drinks    Comment: occ  . Drug use: No     Allergies   Codeine   Review of Systems Review of Systems  Constitutional: Positive for chills. Negative for fever.  HENT: Positive for congestion and rhinorrhea. Negative for ear pain and sore throat.   Respiratory: Positive for choking and wheezing. Negative for shortness of breath.   Cardiovascular: Negative for chest pain.  Gastrointestinal: Negative for abdominal pain, diarrhea and vomiting.  Musculoskeletal: Positive for myalgias.     Physical Exam Updated Vital Signs BP (!) 129/99 (BP Location: Left Arm)   Pulse 72   Temp 99.7 F (37.6 C) (Oral)   Resp 18   Ht _0  (1.93 m)   Wt 130.6 kg   SpO2 99%   BMI 35.06 kg/m   Physical Exam  Constitutional: She appears  well-developed and well-nourished.  Non-toxic appearance. No distress.  HENT:  Head: Normocephalic and atraumatic.  Right Ear: Tympanic membrane is not perforated, not erythematous, not retracted and not bulging.  Left Ear: Tympanic membrane is not perforated, not erythematous, not retracted and not bulging.  Nose: Mucosal edema present. Right sinus exhibits no maxillary sinus tenderness and no frontal sinus tenderness. Left sinus exhibits no frontal sinus tenderness.  Mouth/Throat: Uvula is midline and oropharynx is clear and moist. No oropharyngeal exudate or posterior  oropharyngeal erythema.  Posterior oropharynx is symmetric appearing. Patient tolerating own secretions without difficulty. No trismus. No drooling. No hot potato voice. No swelling beneath the tongue, submandibular compartment is soft.    Eyes: Pupils are equal, round, and reactive to light. Conjunctivae are normal. Right eye exhibits no discharge. Left eye exhibits no discharge.  Neck: Normal range of motion. Neck supple. No neck rigidity. No edema and no erythema present.  Cardiovascular: Normal rate and regular rhythm.  No murmur heard. Pulmonary/Chest: Effort normal. No respiratory distress. She has wheezes (minimal end expiratory at the bases. ). She has no rhonchi. She has no rales.  Abdominal: Soft. She exhibits no distension. There is no tenderness.  Lymphadenopathy:    She has no cervical adenopathy.  Neurological: She is alert.  Skin: Skin is warm and dry. No rash noted.  Psychiatric: She has a normal mood and affect. Her behavior is normal.  Nursing note and vitals reviewed.    ED Treatments / Results  Labs (all labs ordered are listed, but only abnormal results are displayed) Labs Reviewed - No data to display  EKG None  Radiology Dg Chest 2 View  Result Date: 10/10/2018 CLINICAL DATA:  Cough and congestion for 2 days. EXAM: CHEST - 2 VIEW COMPARISON:  12/16/2016 FINDINGS: The cardiac silhouette,  mediastinal and hilar contours are within normal limits and stable. The lungs demonstrate bronchitic changes with peribronchial thickening and increased interstitial markings suggesting bronchitis. No infiltrates or effusions. The bony thorax is intact. IMPRESSION: Findings suggest bronchitis.  No infiltrates or effusions. Electronically Signed   By: Marijo Sanes M.D.   On: 10/10/2018 14:55    Procedures Procedures (including critical care time)  Medications Ordered in ED Medications - No data to display   Initial Impression / Assessment and Plan / ED Course  I have reviewed the triage vital signs and the nursing notes.  Pertinent labs & imaging results that were available during my care of the patient were reviewed by me and considered in my medical decision making (see chart for details).    Patient presents with URI type symptoms.  Patient is nontoxic appearing, in no apparent distress, vitals are WNL other than elevated BP- doubt HTN emergency, PCP recheck. Sxs onset < 7 days, afebrile, no sinus tenderness, doubt acute bacterial sinusitis. Centor score 0, doubt strep pharyngitis. No evidence of AOM on exam. No meningeal signs. CXR negative for infiltrate doubt pneumonia. CXR is suggestive of bronchitis, likely viral, patient w/ minimal expiratory wheeze to be expected with CXR findings. No respiratory distress, not significant wheezing, do not feel steroids are indicated. Supportive tx with albuterol inhaler, flonase, tessalon, and naproxen. I discussed results, treatment plan, need for PCP follow-up, and return precautions with the patient. Provided opportunity for questions, patient confirmed understanding and is in agreement with plan.    Final Clinical Impressions(s) / ED Diagnoses   Final diagnoses:  Viral bronchitis    ED Discharge Orders         Ordered    fluticasone (FLONASE) 50 MCG/ACT nasal spray  Daily     10/10/18 1659    naproxen (NAPROSYN) 500 MG tablet  2 times daily      10/10/18 1659    benzonatate (TESSALON) 100 MG capsule  Every 8 hours     10/10/18 1659    albuterol (PROVENTIL HFA;VENTOLIN HFA) 108 (90 Base) MCG/ACT inhaler  Every 6 hours PRN     10/10/18 1659  Amaryllis Dyke, PA-C 10/10/18 1701    Virgel Manifold, MD 10/14/18 [phone]

## 2018-10-18 MED FILL — ENALAPRIL MALEATE 20 MG TAB: 20 | 30 days supply | Qty: 30 | Fill #4

## 2018-10-18 MED FILL — PIOGLITAZONE HCL 30 MG TAB: 30 | 30 days supply | Qty: 30 | Fill #1

## 2018-10-18 MED FILL — ATORVASTATIN 40 MG TABLET: 40 | 30 days supply | Qty: 30 | Fill #4

## 2018-10-19 ENCOUNTER — Ambulatory Visit (INDEPENDENT_AMBULATORY_CARE_PROVIDER_SITE_OTHER): Payer: MEDICAID | Admitting: Podiatry

## 2018-10-19 ENCOUNTER — Encounter: Payer: Self-pay | Admitting: Podiatry

## 2018-10-19 DIAGNOSIS — E1149 Type 2 diabetes mellitus with other diabetic neurological complication: Secondary | ICD-10-CM

## 2018-10-19 DIAGNOSIS — L84 Corns and callosities: Secondary | ICD-10-CM

## 2018-10-19 DIAGNOSIS — M19071 Primary osteoarthritis, right ankle and foot: Secondary | ICD-10-CM

## 2018-10-19 NOTE — Progress Notes (Signed)
   Subjective:    [name], female    DOB: August 02, 1971, 47 y.o.   MRN: [id number]  HPI    Review of Systems  All other systems reviewed and are negative.      Objective:   Physical Exam        Assessment & Plan:

## 2018-10-21 NOTE — Progress Notes (Signed)
Subjective: Tina [name] presents to the office today today for follow-up evaluation of a wound to the right foot as well as her bilateral foot pain.  Since I last saw her she did pick up her [place] and she states that she has been getting used to it she does like the boot has been helping.  She is noted some catching sensations to her ankle on the right side but overall this is improving.  She denies any recent injury or trauma.  Still some occasional swelling of the right ankle as well.  She still gets a lot of pain on the second toe on the left foot due to the callus.  She has no new concerns otherwise. Denies any systemic complaints such as fevers, chills, nausea, vomiting. No acute changes since last appointment, and no other complaints at this time.   Objective: AAO x3, NAD DP/PT pulses palpable bilaterally, CRT less than 3 seconds The wounds well-healed on her feet. There is mild swelling present of the right ankle but there is no area of tenderness identified at this time.  There is mild restriction of ankle joint range of motion.  Previous triple arthrodesis on the right side.  Significant flatfoot deformities present bilaterally with bunion deformity.  On the left foot there is pain along the medial aspect of the second toe and a painful hyperkeratotic lesion.  Upon debridement of this lesion there is no underlying ulceration, drainage or any signs of infection.  The other callus to her left heel as she is been having difficulty with is doing better.  Her girlfriend has been moisturizing this is been helpful. No other open lesoins   Assessment: Healed ulceration right foot with pre-ulcerative calluses left foot with bilateral chronic foot pain; ankle arthritis  Plan: -All treatment options discussed with the patient including all alternatives, risks, complications.  -Ankle brace is fitting well and she likes it.  She is continue to get used to it.  She still experiences discomfort to being  on her feet all day.  I did sharply debride the hyperkeratotic lesion on the left side without any complications or bleeding to the second toe.  Continue offloading.  Discussed wearing more supportive shoes.  Return in about 4 weeks (around 11/16/2018).  [name] DPM

## 2018-11-16 ENCOUNTER — Ambulatory Visit (INDEPENDENT_AMBULATORY_CARE_PROVIDER_SITE_OTHER): Payer: Self-pay | Admitting: Podiatry

## 2018-11-16 DIAGNOSIS — E1149 Type 2 diabetes mellitus with other diabetic neurological complication: Secondary | ICD-10-CM

## 2018-11-16 DIAGNOSIS — L84 Corns and callosities: Secondary | ICD-10-CM

## 2018-11-16 DIAGNOSIS — M19071 Primary osteoarthritis, right ankle and foot: Secondary | ICD-10-CM

## 2018-11-16 MED ORDER — GABAPENTIN 800 MG PO TABS: 800.0000 mg | ORAL_TABLET | Freq: Three times a day (TID) | ORAL | 1 refills | Status: DC

## 2018-11-22 NOTE — Progress Notes (Signed)
Subjective: Tina [name] presents to the office today today for follow-up evaluation of a wound to the right foot as well as her bilateral foot pain.  She states that [place] brace is comfortable she is getting used to it.  She still gets some swelling along the area but overall has been helpful.  She can try to wear more supportive shoes as well.  She previously was wearing flip-flops.  She states the callus on the left second toe is still painful as well as a callus to the heel but this is also getting better to the heel.Denies any systemic complaints such as fevers, chills, nausea, vomiting. No acute changes since last appointment, and no other complaints at this time.   Objective: AAO x3, NAD DP/PT pulses palpable bilaterally, CRT less than 3 seconds The wounds well-healed on her feet. There is mild swelling present of the right ankle but there is no area of tenderness identified at this time.  There is mild restriction of ankle joint range of motion.  Previous triple arthrodesis on the right side.  Significant flatfoot deformities present bilaterally with bunion deformity.  Overall unchanged but subjectively she is developing improvement into the ankle pain.   On the left foot there is pain along the medial aspect of the second toe and a painful hyperkeratotic lesion.  Upon debridement of this lesion there is no underlying ulceration, drainage or any signs of infection.  The other callus to her left heel as she is been having difficulty with is doing better.   No other open lesoins   Assessment: Healed ulceration right foot with pre-ulcerative calluses left foot with bilateral chronic foot pain; ankle arthritis  Plan: -All treatment options discussed with the patient including all alternatives, risks, complications.  -I debrided the hyperkeratotic lesion still any complications or bleeding on the left foot. -Continue the [place] brace on the right side.  Overall she is making progress.  Continue  with this.  Hopefully insurance will be approved soon and we can start some physical therapy.  Will start 800 mg.  [name] DPM

## 2018-12-07 MED FILL — JANUMET 50-1,000 MG TABLET: 50-1000 | 30 days supply | Qty: 60 | Fill #1 | Status: TO

## 2018-12-07 MED FILL — ENALAPRIL MALEATE 20 MG TAB: 20 | 30 days supply | Qty: 30 | Fill #5

## 2018-12-07 MED FILL — PIOGLITAZONE HCL 30 MG TAB: 30 | 30 days supply | Qty: 30 | Fill #2

## 2018-12-07 MED FILL — ATORVASTATIN 40 MG TABLET: 40 | 30 days supply | Qty: 30 | Fill #5

## 2018-12-14 ENCOUNTER — Encounter: Payer: Self-pay | Admitting: Neurology

## 2018-12-14 ENCOUNTER — Ambulatory Visit (INDEPENDENT_AMBULATORY_CARE_PROVIDER_SITE_OTHER): Payer: Self-pay | Admitting: Podiatry

## 2018-12-14 ENCOUNTER — Encounter: Payer: Self-pay | Admitting: Podiatry

## 2018-12-14 ENCOUNTER — Telehealth: Payer: Self-pay | Admitting: *Deleted

## 2018-12-14 DIAGNOSIS — M19071 Primary osteoarthritis, right ankle and foot: Secondary | ICD-10-CM

## 2018-12-14 DIAGNOSIS — E1149 Type 2 diabetes mellitus with other diabetic neurological complication: Secondary | ICD-10-CM

## 2018-12-14 DIAGNOSIS — L84 Corns and callosities: Secondary | ICD-10-CM

## 2018-12-14 NOTE — Telephone Encounter (Signed)
-----   Message from [name], DPM sent at 12/14/2018 12:38 PM EST ----- Can you please put in a consult for neurology due to symptomatic neuropathy?   Also, can you please put in an order for [facility]? Thanks.

## 2018-12-14 NOTE — Progress Notes (Signed)
Subjective: Tina [name] presents to the office today today for follow-up evaluation of a wound to the right foot as well as her bilateral foot pain. She states that she is still getting a lot of burning to both of her feet.  She also states she still getting pain in the ankle catches on the right side but the brace is fitting well.  She gets a lot of pain the left second toe still.  She states that she still trying exercise and she uses a bike to stay active but she still has difficulty standing for long periods of time.  She denies any recent injury or trauma since I last saw her no increase in swelling.Denies any systemic complaints such as fevers, chills, nausea, vomiting. No acute changes since last appointment, and no other complaints at this time.   Objective: AAO x3, NAD DP/PT pulses palpable bilaterally, CRT less than 3 seconds The wounds well-healed on her feet. There is a significant decrease in medial arch height bilaterally with bunion deformity as well as hammertoe contractures present.  On the left foot there is less symptomatic painful thick hyperkeratotic lesion on the medial aspect of the left second toe as well as left submetatarsal 2 and left fifth metatarsal head laterally.  Upon debridement there is no underlying ulceration, drainage or any signs of infection noted to this area today. In regards to her right ankle she is continuing to wear the [place] brace.  This is helpful however her ankle still catches at times. No other open lesoins   Assessment: Right ankle arthritis with symptomatic flatfoot deformity and hyperkeratotic lesions; neuropathy  Plan: -All treatment options discussed with the patient including all alternatives, risks, complications.  -I debrided the hyperkeratotic lesions x3 without any complications or bleeding. -Continue the [place] brace on the right side.  Also recommended physical therapy and an order was placed. -For the burning to both of her feet she has  tried gabapentin as well as Lyrica and significant improvement.  Due to the continued burning pain I would refer to neurology.  [name] DPM

## 2018-12-14 NOTE — Telephone Encounter (Signed)
Faxed referral, LOV and demographics to [facility]. Faxed referral, 4 LOV and demographics to [facility].

## 2018-12-22 ENCOUNTER — Encounter: Payer: Self-pay | Admitting: Physical Therapy

## 2018-12-22 ENCOUNTER — Ambulatory Visit: Payer: Self-pay | Attending: Podiatry | Admitting: Physical Therapy

## 2018-12-22 DIAGNOSIS — M25671 Stiffness of right ankle, not elsewhere classified: Secondary | ICD-10-CM | POA: Insufficient documentation

## 2018-12-22 DIAGNOSIS — R262 Difficulty in walking, not elsewhere classified: Secondary | ICD-10-CM | POA: Insufficient documentation

## 2018-12-22 DIAGNOSIS — M25571 Pain in right ankle and joints of right foot: Secondary | ICD-10-CM | POA: Insufficient documentation

## 2018-12-22 NOTE — Therapy (Signed)
[place], [place], [address] Phone: [phone]   Fax:  [phone]  Physical Therapy Evaluation  Patient Details  Name: Tina [name] MRN: [id number] Date of Birth: [birth year] Referring Provider (PT): Celesta Gentile, DPM   Encounter Date: 12/22/2018  PT End of Session - 12/22/18 2132    Visit Number  1    Number of Visits  12    Date for PT Re-Evaluation  02/02/19    Authorization Type  self pay, Medicaid pending    PT Start Time  1323    PT Stop Time  1413    PT Time Calculation (min)  50 min    Activity Tolerance  Patient tolerated treatment well    Behavior During Therapy  [facility] for tasks assessed/performed       Past Medical History:  Diagnosis Date  . Diabetes mellitus    type II  . Fibroids   . HTN (hypertension)   . Hyperlipidemia   . Iron deficiency anemia   . Left acetabular fracture ([place])   . Lumbar strain   . Obesity   . Ovarian cyst     Past Surgical History:  Procedure Laterality Date  . FOOT ARTHRODESIS, MODIFIED MCBRIDE     right foot bunion surgery and ankle surgery 1980s  . 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   Subjective Assessment - 12/22/18 1333    Subjective  Pt. is a 48 y/o female referred to PT with c/o chronic right ankle pain issues with underlying arthritis with history arthrodesis/modified Mcbride procedure in the 1980s. Pt. has flatfoot deformity with hammertoe + bunion. Pt. had been working until recently but reports currently unable due to foot/ankle issues. Of note she has had some recent foot wound issues (recently debrided by podiatrist) and has also been referred to neurology for neuropathy issues.    Pertinent History  Surgical history for right foot in 1980s, chronic ankle pain, diabetes, neuropathic pain, wound issues    Limitations  Standing;Walking    How long can you  sit comfortably?  no limitations    How long can you stand comfortably?  unable comfortably    How long can you walk comfortably?  unable comfortably    Diagnostic tests  X-rays    Patient Stated Goals  "Try to get my foot better."    Currently in Pain?  Yes    Pain Score  9     Pain Location  Ankle    Pain Orientation  Right    Pain Descriptors / Indicators  Sharp    Pain Type  Chronic pain    Pain Onset  More than a month ago    Pain Frequency  Intermittent    Aggravating Factors   standing and walking, weightbearing    Pain Relieving Factors  rest    Effect of Pain on Daily Activities  limits tolerance standing and ambulation         [facility] PT Assessment - 12/22/18 0001      Assessment   Medical Diagnosis  Right ankle arthritis    Referring Provider (PT)  Celesta Gentile, DPM    Onset Date/Surgical Date  --   referral 12/14/18, symptoms chronic   Hand Dominance  Right    Prior Therapy  1 PT visit 2017      Precautions   Precautions  None      Restrictions   Weight Bearing Restrictions  No      Balance Screen   Has the patient fallen in the past 6 months  No      [place] residence    Living Arrangements  Spouse/significant other    Type of [place] Access  --   no stairs     Prior Function   Level of Independence  --   SPC use for gait the past year     Cognition   Overall Cognitive Status  Within Functional Limits for tasks assessed      Observation/Other Assessments   Observations  pes planus/flatfoot deformity with hammertoe + bunions, wound right medial ankle distal and inferior to right medial malleolus 3 cm in diameter healing, no signs infection    Focus on Therapeutic Outcomes (FOTO)   72% limited      Observation/Other Assessments-Edema    Edema  --   right ankle 26 cm, left ankle 27 cm at mid-malleolus     Sensation   Light Touch  --   grossly intact to light touch bilat. feet     ROM /  Strength   AROM / PROM / Strength  AROM;PROM;Strength      AROM   AROM Assessment Site  Ankle    Right/Left Ankle  Right;Left    Right Ankle Dorsiflexion  -2    Right Ankle Plantar Flexion  10    Right Ankle Inversion  --   unable EV/IV AROM with foot at 8 deg everted position   Right Ankle Eversion  --   see above   Left Ankle Dorsiflexion  5    Left Ankle Plantar Flexion  23    Left Ankle Inversion  30    Left Ankle Eversion  -5      PROM   PROM Assessment Site  Ankle    Right/Left Ankle  Right    Right Ankle Dorsiflexion  0    Right Ankle Plantar Flexion  13    Right Ankle Inversion  -5    Right Ankle Eversion  10      Strength   Overall Strength Comments  Limited ability to assess right ankle MMTs due to lack of AROM    Strength Assessment Site  Ankle    Right/Left Ankle  Right;Left    Left Ankle Dorsiflexion  4/5    Left Ankle Plantar Flexion  --   formal standing MMT not tested   Left Ankle Inversion  4/5    Left Ankle Eversion  4/5      Ambulation/Gait   Ambulation/Gait  Yes    Assistive device  Straight cane    Gait Pattern  --   antalgic   Gait Comments  Pt. wearing [place] brace right ankle, antalgic gait with feet turned out                Objective measurements completed on examination: See above findings.      [facility] PT Treatment/Exercise - 12/22/18 0001      Exercises   Exercises  Ankle      Ankle Exercises: Seated   Other Seated Ankle Exercises  HEP instruction towel scrunches, towel ER/IR, gastroc stretch with strap, isometrics for EV/IV/PF/DF             PT Education - 12/22/18 2131    Education Details  HEP,  POC    Person(s) Educated  Patient    Methods  Explanation;Demonstration    Comprehension  Verbalized understanding          PT Long Term Goals - 12/22/18 2153      PT LONG TERM GOAL #1   Title  Independent with HEP    Baseline  needs HEP    Time  6    Period  Weeks    Status  New    Target Date   02/02/19      PT LONG TERM GOAL #2   Title  Improve FOTO outcome measure score to 56% or less impairment    Time  6    Period  Weeks    Status  New    Target Date  02/02/19      PT LONG TERM GOAL #3   Title  Increase right ankle AROM at least 5-10 deg in all directions to improve gait mechanics    Baseline  see flowsheet    Time  6    Period  Weeks    Status  New    Target Date  02/02/19      PT LONG TERM GOAL #4   Title  Right ankle strength at least grossly 4/5 to improve stability for gait over uneven surfaces    Baseline  limited ability to assess MMTs due to limited ROM    Time  6    Period  Weeks    Status  New    Target Date  02/02/19      PT LONG TERM GOAL #5   Title  Community ambulation in ankle brace without need for [facility]    Baseline  uses SPC    Time  6    Period  Weeks    Status  New    Target Date  02/02/19             Plan - 12/22/18 2148    Clinical Impression Statement  Pt. presents with right ankle pain, stiffness/decreased ROM, muscle weakness with underlying foot deformity and associated functional limitations for mobility-plan trial PT to see if progress can be made to improve/address associated functional limitations.,    History and Personal Factors relevant to plan of care:  surgical history, underlying OA and flatfoot deformity, neuropathic pain, wound issues, time since onset    Clinical Presentation  Stable    Clinical Decision Making  Low    Rehab Potential  Fair    Clinical Impairments Affecting Rehab Potential  level of ankle stiffness and weakness with above noted history and personal factors    PT Frequency  --   1-2x/week   PT Duration  6 weeks    PT Treatment/Interventions  ADLs/Self Care Home Management;Cryotherapy;Ultrasound;Moist Heat;Iontophoresis 4mg /ml Dexamethasone;Electrical Stimulation;Gait training;Functional mobility training;Neuromuscular re-education;Therapeutic exercise;Therapeutic activities;Patient/family  education;Manual techniques;Taping;Vasopneumatic Device    PT Next Visit Plan  Review HEP as needed, right ankle PROM/AROM, gentle stretches, try adding T-band exercises, seated tilt board and BAPs, modalities prn    PT Home Exercise Plan  towel scrunches, towel EV/IV, gastroc stretch, isometrics for EV/IV,DF, PF    Consulted and Agree with Plan of Care  Patient       Patient will benefit from skilled therapeutic intervention in order to improve the following deficits and impairments:  Abnormal gait, Pain, Impaired flexibility, Difficulty walking, Decreased balance, Decreased activity tolerance, Decreased endurance, Decreased range of motion, Decreased strength, Decreased skin integrity  Visit Diagnosis: Pain in right ankle  and joints of right foot  Stiffness of right ankle, not elsewhere classified  Difficulty in walking, not elsewhere classified     Problem List Patient Active Problem List   Diagnosis Date Noted  . Financial difficulties 04/13/2018  . Seasonal allergies 02/06/2017  . Muscle strain 10/22/2016  . Osteoarthritis of right ankle 08/29/2016  . Tobacco abuse 01/21/2016  . Severe obesity (BMI 35.0-39.9) 10/12/2015  . Foot ulcer, right ([place]) 09/13/2015  . Back pain 05/19/2015  . Chronic constipation 02/07/2013  . Ovarian cyst 12/24/2012  . Right knee pain 12/09/2012  . Hypertension 07/20/2012  . Preventative health care 06/27/2011  . Uncomplicated type 2 diabetes mellitus ([place]) 05/30/2010  . Dyslipidemia 05/30/2010  . 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    Beaulah Dinning, PT, DPT 12/22/18 9:57 PM  [facility] [address] [place], [place], [address] Phone: [phone]   Fax:  [phone]  Name: AYASHA ELLINGSEN MRN: [id number] Date of Birth: 1970/12/21

## 2018-12-27 ENCOUNTER — Emergency Department (HOSPITAL_BASED_OUTPATIENT_CLINIC_OR_DEPARTMENT_OTHER)
Admission: EM | Admit: 2018-12-27 | Discharge: 2018-12-27 | Disposition: A | Discharge status: Home / Self Care | Payer: Self-pay | Attending: Emergency Medicine | Admitting: Emergency Medicine

## 2018-12-27 ENCOUNTER — Other Ambulatory Visit: Payer: Self-pay

## 2018-12-27 ENCOUNTER — Encounter (HOSPITAL_BASED_OUTPATIENT_CLINIC_OR_DEPARTMENT_OTHER): Payer: Self-pay | Admitting: *Deleted

## 2018-12-27 DIAGNOSIS — E119 Type 2 diabetes mellitus without complications: Secondary | ICD-10-CM | POA: Insufficient documentation

## 2018-12-27 DIAGNOSIS — M5442 Lumbago with sciatica, left side: Secondary | ICD-10-CM | POA: Insufficient documentation

## 2018-12-27 DIAGNOSIS — F17210 Nicotine dependence, cigarettes, uncomplicated: Secondary | ICD-10-CM | POA: Insufficient documentation

## 2018-12-27 DIAGNOSIS — Z7984 Long term (current) use of oral hypoglycemic drugs: Secondary | ICD-10-CM | POA: Insufficient documentation

## 2018-12-27 DIAGNOSIS — M5441 Lumbago with sciatica, right side: Secondary | ICD-10-CM | POA: Insufficient documentation

## 2018-12-27 DIAGNOSIS — I10 Essential (primary) hypertension: Secondary | ICD-10-CM | POA: Insufficient documentation

## 2018-12-27 DIAGNOSIS — Z79899 Other long term (current) drug therapy: Secondary | ICD-10-CM | POA: Insufficient documentation

## 2018-12-27 MED ORDER — CYCLOBENZAPRINE HCL 5 MG PO TABS
5.0000 mg | ORAL_TABLET | Freq: Once | ORAL | Status: AC
  Administered 2018-12-27: 5 mg via ORAL
  Filled 2018-12-27: qty 1

## 2018-12-27 MED ORDER — PREDNISONE 10 MG PO TABS: 40.0000 mg | ORAL_TABLET | Freq: Every day | ORAL | 0 refills | Status: AC

## 2018-12-27 MED ORDER — PREDNISONE 50 MG PO TABS
60.0000 mg | ORAL_TABLET | Freq: Once | ORAL | Status: AC
  Administered 2018-12-27: 60 mg via ORAL
  Filled 2018-12-27: qty 1

## 2018-12-27 MED ORDER — CYCLOBENZAPRINE HCL 10 MG PO TABS: 10.0000 mg | ORAL_TABLET | Freq: Two times a day (BID) | ORAL | 0 refills | Status: AC | PRN

## 2018-12-27 MED ORDER — KETOROLAC TROMETHAMINE 60 MG/2ML IM SOLN
15.0000 mg | Freq: Once | INTRAMUSCULAR | Status: AC
  Administered 2018-12-27: 15 mg via INTRAMUSCULAR
  Filled 2018-12-27: qty 2

## 2018-12-27 NOTE — Discharge Instructions (Signed)
Thank you for allowing me to care for you today. Please return to the emergency department if you have new or worsening symptoms. Take your medications as instructed.  ° °

## 2018-12-27 NOTE — ED Provider Notes (Signed)
[facility] EMERGENCY DEPARTMENT Provider Note   CSN: [id number] Arrival date & time: 12/27/18  1719    History   Chief Complaint Chief Complaint  Patient presents with  . Back Pain    HPI Tina [name] is a 48 y.o. female.     Patient is a 48 year old female with past medical history of sciatica, diabetes, fibroids, hypertension, ovarian cyst who presents emergency department for "my sciatica is acting up".  Reports that this started a couple days ago.  Reports that she has a history of the same in the past.  Reports that sometimes the pain is caused from her large fibroids.  Reports that when she was put on Depo shot for her fibroids her back pain got better.  Reports that she does not have insurance and has been not been able to get her Depo shot.  She reports that this feels very similar to instances in the past when she has had a flareup of her pain.  She denies any new injury or trauma.  She has not tried anything for relief.  She reports that usually she comes to the emergency department and she gets a shot and some steroids and that this is helpful and it calms things down.  She denies any saddle anesthesia, loss of control of bowel or bladder, numbness.  Pain radiates from her bilateral lower back into her posterior thighs stopping at the knee.     Past Medical History:  Diagnosis Date  . Diabetes mellitus    type II  . Fibroids   . HTN (hypertension)   . Hyperlipidemia   . Iron deficiency anemia   . Left acetabular fracture ([place])   . Lumbar strain   . Obesity   . Ovarian cyst     Patient Active Problem List   Diagnosis Date Noted  . Financial difficulties 04/13/2018  . Seasonal allergies 02/06/2017  . Muscle strain 10/22/2016  . Osteoarthritis of right ankle 08/29/2016  . Tobacco abuse 01/21/2016  . Severe obesity (BMI 35.0-39.9) 10/12/2015  . Foot ulcer, right ([place]) 09/13/2015  . Back pain 05/19/2015  . Chronic constipation 02/07/2013  .  Ovarian cyst 12/24/2012  . Right knee pain 12/09/2012  . Hypertension 07/20/2012  . Preventative health care 06/27/2011  . Uncomplicated type 2 diabetes mellitus ([place]) 05/30/2010  . Dyslipidemia 05/30/2010  . 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    Past Surgical History:  Procedure Laterality Date  . FOOT ARTHRODESIS, MODIFIED MCBRIDE     right foot bunion surgery and ankle surgery 1980s  . 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 Authorizing Provider  albuterol (PROVENTIL HFA;VENTOLIN HFA) 108 (90 Base) MCG/ACT inhaler Inhale 1-2 puffs into the lungs every 6 (six) hours as needed for wheezing or shortness of breath. 10/10/18   Petrucelli, [name], PA-C  atorvastatin (LIPITOR) 40  MG tablet Take 1 tablet (40 mg total) by mouth daily. 04/13/18   Maryellen Pile, MD  benzonatate (TESSALON) 100 MG capsule Take 1 capsule (100 mg total) by mouth every 8 (eight) hours. Patient not taking: Reported on 12/22/2018 10/10/18   Petrucelli, Aldona Bar R, PA-C  Blood Glucose Monitoring Suppl (CONTOUR NEXT ONE) KIT 1 each by Does not apply route See admin instructions. USE TO CHECK BLOOD GLUCOSE ONCE DAILY. IM PROGRAM. 01/25/18   [name], MD  cyclobenzaprine (FLEXERIL) 10 MG tablet Take 1 tablet (10 mg total) by mouth 2 (two) times daily as needed for up to 7 days for muscle spasms. 12/27/18 01/03/19  Alveria Apley, PA-C  diclofenac (VOLTAREN) 75 MG EC tablet TAKE 1 TABLET BY MOUTH 2 TIMES DAILY. 09/06/18   [name], DPM  diclofenac sodium (VOLTAREN) 1 % GEL Apply 2 g topically 4 (four) times daily. 08/29/16   Asencion Partridge, MD  enalapril (VASOTEC) 20 MG tablet Take 1  tablet (20 mg total) by mouth daily. IMTS program. 04/13/18   Maryellen Pile, MD  fluticasone Asencion Islam) 50 MCG/ACT nasal spray Place 1 spray into both nostrils daily. Patient not taking: Reported on 12/22/2018 10/10/18   Petrucelli, Aldona Bar R, PA-C  gabapentin (NEURONTIN) 800 MG tablet Take 1 tablet (800 mg total) by mouth 3 (three) times daily. 11/16/18   [name], DPM  lamoTRIgine (LAMICTAL) 100 MG tablet Take 100 mg by mouth daily.    [provider]  Multiple Vitamins-Minerals (MULTIVITAMIN WITH MINERALS) tablet Take 1 tablet by mouth daily.      [provider]  naproxen (NAPROSYN) 500 MG tablet Take 1 tablet (500 mg total) by mouth 2 (two) times daily. 10/10/18   Petrucelli, [name], PA-C  pioglitazone (ACTOS) 30 MG tablet TAKE 1 TABLET BY MOUTH DAILY. IM PROGRAM 09/06/18   Welford Roche, MD  predniSONE (DELTASONE) 10 MG tablet Take 4 tablets (40 mg total) by mouth daily for 5 days. 12/27/18 01/01/19  Madilyn Hook A, PA-C  sertraline (ZOLOFT) 100 MG tablet Take 100 mg by mouth daily.    [provider]  sitaGLIPtin-metformin (JANUMET) 50-1000 MG tablet Take 1 tablet by mouth 2 (two) times daily with a meal. IM program. 09/09/18   Welford Roche, MD    Family History Family History  Problem Relation Age of Onset  . Colon cancer Sister   . Diabetes Mother   . Diabetes Father   . Diabetes Brother   . Celiac disease Paternal Aunt     Social History Social History   Tobacco Use  . Smoking status: Current Every Day Smoker    Packs/day: 0.50    Years: 23.00    Pack years: 11.50    Types: Cigarettes  . Smokeless tobacco: Never Used  . Tobacco comment: 1/2PPD  Substance Use Topics  . Alcohol use: Yes    Alcohol/week: 0.0 standard drinks    Comment: occ  . Drug use: No     Allergies   Codeine   Review of Systems Review of Systems  Constitutional: Negative for chills and fever.  HENT: Negative for ear pain and sore throat.    Eyes: Negative for pain and visual disturbance.  Respiratory: Negative for cough and shortness of breath.   Cardiovascular: Negative for chest pain and palpitations.  Gastrointestinal: Negative for abdominal pain, diarrhea, nausea and vomiting.  Genitourinary: Negative for decreased urine volume, dysuria, hematuria, vaginal bleeding and vaginal discharge.  Musculoskeletal: Positive for back pain. Negative for arthralgias.  Skin: Negative for color change and rash.  Neurological: Negative for dizziness, seizures and syncope.  All other systems reviewed and are negative.    Physical Exam Updated Vital Signs BP (!) 117/59 (BP Location: Left Arm)   Pulse 76   Temp 97.8 F (36.6 C) (Oral)   Resp 18   Ht '6\' 4"'  (1.93 m)   Wt 136.1 kg   SpO2 97%   BMI 36.52 kg/m   Physical Exam Vitals signs and nursing note reviewed.  Constitutional:      Appearance: Normal appearance.  HENT:     Head: Normocephalic.  Eyes:     Conjunctiva/sclera: Conjunctivae normal.  Pulmonary:     Effort: Pulmonary effort is normal.  Musculoskeletal:     Lumbar back: She exhibits decreased range of motion, tenderness and pain. She exhibits no bony tenderness, no swelling, no edema, no deformity, no laceration, no spasm and normal pulse.       Back:  Skin:    General: Skin is dry.     Capillary Refill: Capillary refill takes less than 2 seconds.  Neurological:     General: No focal deficit present.     Mental Status: She is alert.     Deep Tendon Reflexes: Reflexes normal.  Psychiatric:        Mood and Affect: Mood normal.      ED Treatments / Results  Labs (all labs ordered are listed, but only abnormal results are displayed) Labs Reviewed - No data to display  EKG None  Radiology No results found.  Procedures Procedures (including critical care time)  Medications Ordered in ED Medications  ketorolac (TORADOL) injection 15 mg (15 mg Intramuscular Given 12/27/18 1913)  predniSONE  (DELTASONE) tablet 60 mg (60 mg Oral Given 12/27/18 1913)  cyclobenzaprine (FLEXERIL) tablet 5 mg (5 mg Oral Given 12/27/18 1913)     Initial Impression / Assessment and Plan / ED Course  I have reviewed the triage vital signs and the nursing notes.  Pertinent labs & imaging results that were available during my care of the patient were reviewed by me and considered in my medical decision making (see chart for details).        Based on review of vitals, medical screening exam, lab work and/or imaging, there does not appear to be an acute, emergent etiology for the patient's symptoms. Counseled pt on good return precautions and encouraged both PCP and ED follow-up as needed.    Clinical Impression: 1. Acute bilateral low back pain with bilateral sciatica     Disposition: Discharge    This note was prepared with assistance of Dragon voice recognition software. Occasional wrong-word or sound-a-like substitutions may have occurred due to the inherent limitations of voice recognition software.   Final Clinical Impressions(s) / ED Diagnoses   Final diagnoses:  Acute bilateral low back pain with bilateral sciatica    ED Discharge Orders         Ordered    predniSONE (DELTASONE) 10 MG tablet  Daily     12/27/18 1944    cyclobenzaprine (FLEXERIL) 10 MG tablet  2 times daily PRN     12/27/18 1944           Kristine Royal 12/27/18 2011    Margette Fast, MD 12/28/18 1019

## 2018-12-27 NOTE — ED Triage Notes (Signed)
Back pain with radiation to her legs. She is ambulatory.

## 2018-12-28 ENCOUNTER — Encounter: Payer: Self-pay | Admitting: Internal Medicine

## 2018-12-28 MED FILL — predniSONE 10 MG TABS: 10 | 5 days supply | Qty: 20 | Fill #0

## 2018-12-28 MED FILL — CYCLOBENZAPRINE 10 MG TAB: 10 | 7 days supply | Qty: 14 | Fill #0

## 2018-12-29 MED FILL — GABAPENTIN 800 MG TABLET: 800 | 15 days supply | Qty: 45 | Fill #0 | Status: TO

## 2018-12-29 MED FILL — ENALAPRIL MALEATE 20 MG TAB: 20 | 30 days supply | Qty: 30 | Fill #6 | Status: TO

## 2018-12-30 ENCOUNTER — Ambulatory Visit: Payer: Self-pay | Admitting: Physical Therapy

## 2019-01-04 ENCOUNTER — Ambulatory Visit: Payer: Self-pay | Attending: Podiatry | Admitting: Physical Therapy

## 2019-01-04 ENCOUNTER — Encounter: Payer: Self-pay | Admitting: Physical Therapy

## 2019-01-04 DIAGNOSIS — M6281 Muscle weakness (generalized): Secondary | ICD-10-CM | POA: Insufficient documentation

## 2019-01-04 DIAGNOSIS — M5440 Lumbago with sciatica, unspecified side: Secondary | ICD-10-CM | POA: Insufficient documentation

## 2019-01-04 DIAGNOSIS — M25571 Pain in right ankle and joints of right foot: Secondary | ICD-10-CM | POA: Insufficient documentation

## 2019-01-04 DIAGNOSIS — G8929 Other chronic pain: Secondary | ICD-10-CM | POA: Insufficient documentation

## 2019-01-04 DIAGNOSIS — M25671 Stiffness of right ankle, not elsewhere classified: Secondary | ICD-10-CM | POA: Insufficient documentation

## 2019-01-04 DIAGNOSIS — R262 Difficulty in walking, not elsewhere classified: Secondary | ICD-10-CM | POA: Insufficient documentation

## 2019-01-04 NOTE — Therapy (Signed)
[place], [place], [address] Phone: [phone]   Fax:  [phone]  Physical Therapy Treatment  Patient Details  Name: Tina [name] MRN: [id number] Date of Birth: 12/02/1970 Referring Provider (PT): Celesta Gentile, DPM   Encounter Date: 01/04/2019  PT End of Session - 01/04/19 1552    Visit Number  2    Number of Visits  12    Date for PT Re-Evaluation  02/02/19    Authorization Type  self pay, Medicaid pending    PT Start Time  1548    PT Stop Time  1638   32 min skilled time, estim/indirect time not included   PT Time Calculation (min)  50 min    Activity Tolerance  Patient tolerated treatment well    Behavior During Therapy  [facility] for tasks assessed/performed       Past Medical History:  Diagnosis Date  . Diabetes mellitus    type II  . Fibroids   . HTN (hypertension)   . Hyperlipidemia   . Iron deficiency anemia   . Left acetabular fracture ([place])   . Lumbar strain   . Obesity   . Ovarian cyst     Past Surgical History:  Procedure Laterality Date  . FOOT ARTHRODESIS, MODIFIED [facility]     right foot bunion surgery and ankle surgery 1980s  . 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  Subjective Assessment - 01/04/19 1552    Subjective  Pt. continues with foot pain but has been having LBP issues as well. She went to ED 12/27/18 with  c/o LBP and bilat. buttock pain and had Toradol injection as well as prednisone prescription. No significant improvement yet for LBP.    Currently in Pain?  Yes    Pain Score  7     Pain Location  Ankle    Pain Orientation  Right    Pain Descriptors / Indicators  Sharp    Pain Type  Chronic pain    Pain Onset  More than a month ago    Pain Frequency  Intermittent    Aggravating Factors   standing and walking, weightbearing RLE    Pain Relieving Factors  rest    Effect of Pain on  Daily Activities  limits mobility tolerance for standing and ambulation    Multiple Pain Sites  Yes    Pain Score  9    Pain Location  Back    Pain Orientation  Lower    Pain Descriptors / Indicators  Sharp    Pain Type  Acute pain    Pain Radiating Towards  bilat. buttocks    Pain Onset  1 to 4 weeks ago    Pain Frequency  Constant    Pain Relieving Factors  no eases noted    Effect of Pain on Daily Activities  limits mobility tolerance for ADLs, positional tolerance for sitting vs. standing, walking                       [facility] Adult PT Treatment/Exercise - 01/04/19 0001      Modalities   Modalities  Cryotherapy;Electrical Stimulation      Cryotherapy   Number Minutes Cryotherapy  10 Minutes    Cryotherapy Location  Ankle    Type of Cryotherapy  Ice pack      Electrical  Stimulation   Electrical Stimulation Location  right ankle-dorsal and lateral aspect    Electrical Stimulation Action  premod high    Electrical Stimulation Parameters  80-120 MHZ    Electrical Stimulation Goals  Pain      Ankle Exercises: Aerobic   Nustep  L3 x 5 min UE/LE   for ankle DF assist     Ankle Exercises: Stretches   Gastroc Stretch  3 reps;30 seconds   supine manual stretch     Ankle Exercises: Standing   Other Standing Ankle Exercises  Romberg Airex EO/EC 3 x 20 sec      Ankle Exercises: Seated   Towel Crunch  --   10 reps   BAPS  Sitting;Level 1;10 reps    Other Seated Ankle Exercises  seated ankle DF/PF and EV/IV x 10 ea.      Ankle Exercises: Supine   T-Band  Theraband ankle 4-way x 10 ea. with yellow band             PT Education - 01/04/19 1654    Education Details  POC, HEP, exercise form    Person(s) Educated  Patient    Methods  Explanation    Comprehension  Verbalized understanding;Returned demonstration;Verbal cues required;Tactile cues required          PT Long Term Goals - 12/22/18 2153      PT LONG TERM GOAL #1   Title  Independent with  HEP    Baseline  needs HEP    Time  6    Period  Weeks    Status  New    Target Date  02/02/19      PT LONG TERM GOAL #2   Title  Improve FOTO outcome measure score to 56% or less impairment    Time  6    Period  Weeks    Status  New    Target Date  02/02/19      PT LONG TERM GOAL #3   Title  Increase right ankle AROM at least 5-10 deg in all directions to improve gait mechanics    Baseline  see flowsheet    Time  6    Period  Weeks    Status  New    Target Date  02/02/19      PT LONG TERM GOAL #4   Title  Right ankle strength at least grossly 4/5 to improve stability for gait over uneven surfaces    Baseline  limited ability to assess MMTs due to limited ROM    Time  6    Period  Weeks    Status  New    Target Date  02/02/19      PT LONG TERM GOAL #5   Title  Community ambulation in ankle brace without need for [facility]    Baseline  uses SPC    Time  6    Period  Weeks    Status  New    Target Date  02/02/19            Plan - 01/04/19 1655    Clinical Impression Statement  Pt. continues with grossly limitation right ankle/foot ROM and strength. Limited ability to add/progress standing stabilization and proprioceptove challenges due to severe LBP. Plan continue POC pending progress and next follow up with referring provider, if no improvement in the next 2-3 sessions then d/c    Stability/Clinical Decision Making  Evolving/Moderate complexity   due to LBP   Clinical Decision  Making  Moderate    Rehab Potential  Fair    Clinical Impairments Affecting Rehab Potential  level of ankle stiffness and weakness with above noted history and personal factors    PT Frequency  --   1-2x/week   PT Duration  6 weeks    PT Treatment/Interventions  ADLs/Self Care Home Management;Cryotherapy;Ultrasound;Moist Heat;Iontophoresis 4mg /ml Dexamethasone;Electrical Stimulation;Gait training;Functional mobility training;Neuromuscular re-education;Therapeutic exercise;Therapeutic  activities;Patient/family education;Manual techniques;Taping;Vasopneumatic Device    PT Next Visit Plan  Monitor back pain, Review HEP as needed, right ankle PROM/AROM, gentle stretches, try adding T-band exercises, seated tilt board and BAPs, modalities prn    PT Home Exercise Plan  towel scrunches, towel EV/IV, gastroc stretch, isometrics for EV/IV,DF, PF    Consulted and Agree with Plan of Care  Patient       Patient will benefit from skilled therapeutic intervention in order to improve the following deficits and impairments:  Abnormal gait, Pain, Impaired flexibility, Difficulty walking, Decreased balance, Decreased activity tolerance, Decreased endurance, Decreased range of motion, Decreased strength, Decreased skin integrity  Visit Diagnosis: Pain in right ankle and joints of right foot  Stiffness of right ankle, not elsewhere classified  Difficulty in walking, not elsewhere classified     Problem List Patient Active Problem List   Diagnosis Date Noted  . Financial difficulties 04/13/2018  . Seasonal allergies 02/06/2017  . Muscle strain 10/22/2016  . Osteoarthritis of right ankle 08/29/2016  . Tobacco abuse 01/21/2016  . Severe obesity (BMI 35.0-39.9) 10/12/2015  . Foot ulcer, right ([place]) 09/13/2015  . Back pain 05/19/2015  . Chronic constipation 02/07/2013  . Ovarian cyst 12/24/2012  . Right knee pain 12/09/2012  . Hypertension 07/20/2012  . Preventative health care 06/27/2011  . Uncomplicated type 2 diabetes mellitus ([place]) 05/30/2010  . Dyslipidemia 05/30/2010  . 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    Beaulah Dinning, PT, DPT 01/04/19 4:59 PM  [facility] [address] [place], [place], [address] Phone: [phone]   Fax:  [phone]  Name: KRISTALYNN CODDINGTON MRN: [id number] Date of Birth: 19-Dec-1970

## 2019-01-07 ENCOUNTER — Other Ambulatory Visit: Payer: Self-pay

## 2019-01-07 ENCOUNTER — Ambulatory Visit (INDEPENDENT_AMBULATORY_CARE_PROVIDER_SITE_OTHER): Payer: Self-pay | Admitting: Dietician

## 2019-01-07 ENCOUNTER — Encounter: Payer: Self-pay | Admitting: Dietician

## 2019-01-07 ENCOUNTER — Ambulatory Visit (INDEPENDENT_AMBULATORY_CARE_PROVIDER_SITE_OTHER): Payer: Self-pay | Admitting: Internal Medicine

## 2019-01-07 ENCOUNTER — Other Ambulatory Visit: Payer: Self-pay | Admitting: Dietician

## 2019-01-07 VITALS — BP 131/78 | HR 112 | Wt 306.7 lb

## 2019-01-07 DIAGNOSIS — E1165 Type 2 diabetes mellitus with hyperglycemia: Secondary | ICD-10-CM

## 2019-01-07 DIAGNOSIS — I10 Essential (primary) hypertension: Secondary | ICD-10-CM

## 2019-01-07 DIAGNOSIS — M5116 Intervertebral disc disorders with radiculopathy, lumbar region: Secondary | ICD-10-CM

## 2019-01-07 DIAGNOSIS — E119 Type 2 diabetes mellitus without complications: Secondary | ICD-10-CM

## 2019-01-07 DIAGNOSIS — Z0000 Encounter for general adult medical examination without abnormal findings: Secondary | ICD-10-CM

## 2019-01-07 DIAGNOSIS — Z720 Tobacco use: Secondary | ICD-10-CM

## 2019-01-07 DIAGNOSIS — M79671 Pain in right foot: Secondary | ICD-10-CM

## 2019-01-07 DIAGNOSIS — F17210 Nicotine dependence, cigarettes, uncomplicated: Secondary | ICD-10-CM

## 2019-01-07 DIAGNOSIS — Z79899 Other long term (current) drug therapy: Secondary | ICD-10-CM

## 2019-01-07 DIAGNOSIS — G8929 Other chronic pain: Secondary | ICD-10-CM

## 2019-01-07 DIAGNOSIS — Z7984 Long term (current) use of oral hypoglycemic drugs: Secondary | ICD-10-CM

## 2019-01-07 DIAGNOSIS — Z8719 Personal history of other diseases of the digestive system: Secondary | ICD-10-CM

## 2019-01-07 DIAGNOSIS — Z309 Encounter for contraceptive management, unspecified: Secondary | ICD-10-CM

## 2019-01-07 DIAGNOSIS — E118 Type 2 diabetes mellitus with unspecified complications: Secondary | ICD-10-CM

## 2019-01-07 LAB — POCT GLYCOSYLATED HEMOGLOBIN (HGB A1C): Hemoglobin A1C: 8.6 % — AB (ref 4.0–5.6)

## 2019-01-07 LAB — GLUCOSE, CAPILLARY: GLUCOSE-CAPILLARY: 345 mg/dL — AB (ref 70–99)

## 2019-01-07 LAB — HM DIABETES EYE EXAM

## 2019-01-07 LAB — POCT URINE PREGNANCY: Preg Test, Ur: NEGATIVE

## 2019-01-07 MED ORDER — GABAPENTIN 800 MG PO TABS: 800.0000 mg | ORAL_TABLET | Freq: Three times a day (TID) | ORAL | 3 refills | Status: DC

## 2019-01-07 MED ORDER — MEDROXYPROGESTERONE ACETATE 150 MG/ML IM SUSP
150.0000 mg | Freq: Once | INTRAMUSCULAR | Status: AC
  Administered 2019-01-07: 150 mg via INTRAMUSCULAR

## 2019-01-07 NOTE — Patient Instructions (Addendum)
Ms. Guadiana,   We talked about several issues today.   For your diabetes, please continue taking your current medications as usual.   For your back pain, I put in a referral for physical therapy. The next referral would be to a neurosurgeon for further evaluation which we are unable to do without insurance. It will be very important that you collect all the documents for either the orange card or the charity program.   For your feet pain, I sent a refill for gabapentin to [facility]. Continue taking 800 mg three times a day.   Please make a follow up appointment with me in 12 week to recheck your diabetes and the next Depo shot.   Call us if you have any questions or concerns.   -Dr. Frederico Hamman

## 2019-01-07 NOTE — Progress Notes (Signed)
Retinal images were done and transmitted today.  

## 2019-01-08 LAB — CMP14 + ANION GAP
ALT: 21 IU/L (ref 0–32)
ANION GAP: 19 mmol/L — AB (ref 10.0–18.0)
AST: 13 IU/L (ref 0–40)
Albumin/Globulin Ratio: 1.6 (ref 1.2–2.2)
Albumin: 4.9 g/dL — ABNORMAL HIGH (ref 3.8–4.8)
Alkaline Phosphatase: 79 IU/L (ref 39–117)
BUN/Creatinine Ratio: 18 (ref 9–23)
BUN: 15 mg/dL (ref 6–24)
Bilirubin Total: <0.2 mg/dL (ref 0.0–1.2)
CALCIUM: 10 mg/dL (ref 8.7–10.2)
CO2: 23 mmol/L (ref 20–29)
CREATININE: 0.82 mg/dL (ref 0.57–1.00)
Chloride: 95 mmol/L — ABNORMAL LOW (ref 96–106)
GFR calc non Af Amer: 84 mL/min/{1.73_m2} (ref >59)
GFR, EST AFRICAN AMERICAN: 97 mL/min/{1.73_m2} (ref >59)
Globulin, Total: 3.1 g/dL (ref 1.5–4.5)
Glucose: 336 mg/dL — ABNORMAL HIGH (ref 65–99)
Potassium: 4.2 mmol/L (ref 3.5–5.2)
Sodium: 137 mmol/L (ref 134–144)
TOTAL PROTEIN: 8 g/dL (ref 6.0–8.5)

## 2019-01-08 LAB — CBC
Hematocrit: 39.9 % (ref 34.0–46.6)
Hemoglobin: 13 g/dL (ref 11.1–15.9)
MCH: 27.7 pg (ref 26.6–33.0)
MCHC: 32.6 g/dL (ref 31.5–35.7)
MCV: 85 fL (ref 79–97)
Platelets: 468 10*3/uL — ABNORMAL HIGH (ref 150–450)
RBC: 4.7 x10E6/uL (ref 3.77–5.28)
RDW: 12.6 % (ref 11.7–15.4)
WBC: 16 10*3/uL — ABNORMAL HIGH (ref 3.4–10.8)

## 2019-01-08 LAB — LIPID PANEL
Chol/HDL Ratio: 4.3 ratio (ref 0.0–4.4)
Cholesterol, Total: 178 mg/dL (ref 100–199)
HDL: 41 mg/dL (ref >39)
LDL Calculated: 96 mg/dL (ref 0–99)
Triglycerides: 207 mg/dL — ABNORMAL HIGH (ref 0–149)
VLDL Cholesterol Cal: 41 mg/dL — ABNORMAL HIGH (ref 5–40)

## 2019-01-09 ENCOUNTER — Encounter: Payer: Self-pay | Admitting: Internal Medicine

## 2019-01-09 NOTE — Assessment & Plan Note (Signed)
Declined flu shot. Foot and eye exam performed today.   Due for colonoscopy per GI recommendations after tubular adenoma found on previous one > 5 yrs ago. Currently uninsured, working on [employer].   Also due for PAP smear which she would like to be done at [facility] as she usually does. Provided information on free PAP smear clinic which was also mailed to her last week.   She requested Depo-Provera injection which she used to receive at Ob/GYN clinic. She has a history of HTN, otherwise no other contraindications including current pregnancy (UPT negative today), history of thromboembolic event, history of breast cancer, vaginal bleeding or liver disease. Depo-Provera 150 mg administered today. Follow up in 12 weeks.

## 2019-01-09 NOTE — Assessment & Plan Note (Addendum)
Patient has a history of uncontrolled DM secondary to financial issues. She used to be on insulin Levemir but is no longer able to afford this. She is currently on Janumet and pioglitazone, reports compliance. Denies polyuria and polydipsia. A1c 8.6 from 7.7 one year ago. She is also 20 lbs up since last seen in 01/2018.   I recommended switching to a SGLT2 inhibitor for better glycemic control, but patient declined. Stated she has recent ongoing stressors at home and would not like to make changes on her medications at this time. I advised to follow up in 3 months. In the mean time, she will work on obtaining the documents to apply for the [employer] vs [facility] which will help facilitate DM management in the future. At the time of follow up, would recommend stopping Janumet and starting Invokamet vs Synjardy and +/- starting long acting insulin if A1c continues to increase as she responded well to this in the past. Would continue pioglitazone to minimize medication changes.   Foot and eye exam performed today.

## 2019-01-09 NOTE — Assessment & Plan Note (Signed)
Well controlled on enalapril 10 mg QD. Will continue.

## 2019-01-09 NOTE — Progress Notes (Signed)
   CC: T2DM follow up and acute on chronic low back pain   HPI:  [name] is a 47 y.o. year-old female with PMH listed below who presents to clinic for T2DM follow up and acute on chronic low back pain. Please see problem based assessment and plan for further details.   Past Medical History:  Diagnosis Date  . Diabetes mellitus    type II  . Fibroids   . HTN (hypertension)   . Hyperlipidemia   . Iron deficiency anemia   . Left acetabular fracture ([place])   . Lumbar strain   . Obesity   . Ovarian cyst    Review of Systems:   Review of Systems  Constitutional: Negative for chills, fever and weight loss.  Genitourinary: Negative for frequency and urgency.  Musculoskeletal: Positive for back pain.  Neurological: Negative for dizziness and headaches.  Endo/Heme/Allergies: Negative for polydipsia.    Physical Exam: Vitals:   01/07/19 1352  BP: 131/78  Pulse: (!) 112  SpO2: 99%  Weight: (!) 306 lb 11.2 oz (139.1 kg)   General: well-appearing female in NAD  Cardiac: regular rate and rhythm, nl S1/S2, no murmurs, rubs or gallops, no JVD  Pulm: CTAB, no wheezes or crackles, no increased work of breathing on room air  Ext: warm and well perfused, R foot ulcer on medial aspect of foot completely healed    Assessment & Plan:   See Encounters Tab for problem based charting.  Patient discussed with Dr. Dareen Piano

## 2019-01-09 NOTE — Assessment & Plan Note (Signed)
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 She was recently seen in the ED with acute and chronic low back pain and treated with muscle relaxants and steroids. No imaging obtained. She is also on gabapentin 800 mg TID for chronic R foot pain. Reports no relief in pain with current treatments and is requesting a referral to PT and neurology for further evaluation and management. She has been offered steroid injections in the past but is not interested in this either.   I discussed she would have to pay out of pocket for referrals as she is currently uninsured. I referred her to PT per her request, but will hold off on neurosurgery while she works on obtaining the [employer].

## 2019-01-09 NOTE — Assessment & Plan Note (Signed)
Continues to smoke 0.5ppd. Discussed smoking cessation but patient reported she is not ready to quit at this time.

## 2019-01-10 MED FILL — PIOGLITAZONE HCL 30 MG TAB: 30 | 30 days supply | Qty: 30 | Fill #3 | Status: TO

## 2019-01-10 MED FILL — ATORVASTATIN 40 MG TABLET: 40 | 30 days supply | Qty: 30 | Fill #6 | Status: TO

## 2019-01-10 NOTE — Addendum Note (Signed)
Addended by: Aldine Contes on: 01/10/2019 02:02 PM   Modules accepted: Level of Service

## 2019-01-11 ENCOUNTER — Ambulatory Visit: Payer: Self-pay | Admitting: Physical Therapy

## 2019-01-11 ENCOUNTER — Ambulatory Visit (INDEPENDENT_AMBULATORY_CARE_PROVIDER_SITE_OTHER): Payer: MEDICAID | Admitting: Podiatry

## 2019-01-11 ENCOUNTER — Encounter: Payer: Self-pay | Admitting: Physical Therapy

## 2019-01-11 ENCOUNTER — Telehealth: Payer: Self-pay | Admitting: *Deleted

## 2019-01-11 DIAGNOSIS — E1149 Type 2 diabetes mellitus with other diabetic neurological complication: Secondary | ICD-10-CM

## 2019-01-11 DIAGNOSIS — L84 Corns and callosities: Secondary | ICD-10-CM

## 2019-01-11 DIAGNOSIS — R262 Difficulty in walking, not elsewhere classified: Secondary | ICD-10-CM

## 2019-01-11 DIAGNOSIS — M19071 Primary osteoarthritis, right ankle and foot: Secondary | ICD-10-CM

## 2019-01-11 DIAGNOSIS — M25571 Pain in right ankle and joints of right foot: Secondary | ICD-10-CM

## 2019-01-11 DIAGNOSIS — R2681 Unsteadiness on feet: Secondary | ICD-10-CM

## 2019-01-11 DIAGNOSIS — M25671 Stiffness of right ankle, not elsewhere classified: Secondary | ICD-10-CM

## 2019-01-11 DIAGNOSIS — M5440 Lumbago with sciatica, unspecified side: Secondary | ICD-10-CM

## 2019-01-11 DIAGNOSIS — G629 Polyneuropathy, unspecified: Secondary | ICD-10-CM

## 2019-01-11 DIAGNOSIS — G8929 Other chronic pain: Secondary | ICD-10-CM

## 2019-01-11 DIAGNOSIS — M6281 Muscle weakness (generalized): Secondary | ICD-10-CM

## 2019-01-11 NOTE — Telephone Encounter (Signed)
Referral, and demographics faxed to [facility].

## 2019-01-11 NOTE — Therapy (Signed)
[place], [place], [address] Phone: [phone]   Fax:  [phone]  Physical Therapy Treatment/Re-evaluation  Patient Details  Name: Tina [name] MRN: [id number] Date of Birth: Nov 26, 1970 Referring Provider (PT): Welford Roche   Encounter Date: 01/11/2019  PT End of Session - 01/11/19 1551    Visit Number  3    Number of Visits  12    Date for PT Re-Evaluation  02/22/19    Authorization Type  self pay, Medicaid pending    PT Start Time  1415    PT Stop Time  1517    PT Time Calculation (min)  62 min    Activity Tolerance  Patient limited by pain    Behavior During Therapy  [facility] for tasks assessed/performed       Past Medical History:  Diagnosis Date  . Diabetes mellitus    type II  . Fibroids   . HTN (hypertension)   . Hyperlipidemia   . Iron deficiency anemia   . Left acetabular fracture ([place])   . Lumbar strain   . Obesity   . Ovarian cyst     Past Surgical History:  Procedure Laterality Date  . FOOT ARTHRODESIS, MODIFIED [facility]BRIDE     right foot bunion surgery and ankle surgery 1980s  . 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  Subjective Assessment - 01/11/19 1420    Subjective  Pt. currently under plan of care for right foot pain from Dr. Jacqualyn Posey received new PT referral for lumbar region from Dr. Isac Sarna. Pt. with mulit-year history chronic LBP issues and previously diagnosed disc herniation reports had acute exacerbation of lumbar and bilat. LE radiating pain 12/20/18. She went to the ED a week later and had prednisone shot but pain has persisted. Pt. is pending referral to neurosurgeon but is awaiting approval of orange card for insurance coverage of this as she is currently uninsured. Pain today is local to lumbar region but pt. reports intermittent radiating pain from lumbar region into bilat.  buttocks and thighs proximal to knees. She denies saddle parasthesias or issues such as urinary retention but does report has had some issues with what sounds like urinary urge incontinence/"making it to the bathroom in time". Reviewed signs and symptoms of cauda equina and instructed to go to ED if experiencing.    Pertinent History  Surgical history for right foot in 1980s, chronic ankle pain, diabetes, neuropathic pain, wound issues    Limitations  Sitting;House hold activities;Lifting;Standing;Walking    How long can you sit comfortably?  unable comfortably    How long can you stand comfortably?  unable comfortably    How long can you walk comfortably?  unable comfortably    Diagnostic tests  X-rays (for foot), no recent lumbar imaging    Currently in Pain?  Yes    Pain Score  8     Pain Location  Back    Pain Orientation  Lower    Pain Descriptors / Indicators  Burning;Pressure;Sharp   "pulling"   Pain Type  Chronic pain   acute on chronic   Pain Radiating Towards  no radiating pain at session but reports intermittent radiating into bilat. buttocks and thighs    Pain Onset  1 to 4 weeks ago    Pain Frequency  Constant    Aggravating Factors   activity, reports standing  and sitting equally painful    Pain Relieving Factors  no eases    Effect of Pain on Daily Activities  limits activity tolerance for ADLs/IADLs         [facility] PT Assessment - 01/11/19 0001      Assessment   Medical Diagnosis  Chronic LBP with Sciatica    Referring Provider (PT)  Merlene Morse Santos-Sanchez    Onset Date/Surgical Date  12/20/18    Hand Dominance  Right      Precautions   Precautions  None      Restrictions   Weight Bearing Restrictions  No      Balance Screen   Has the patient fallen in the past 6 months  No      Sensation   Light Touch  --   decreased at right L4-S1 dermatomes   Additional Comments  decreased sensation as noted, has history peripheral neuropathy      ROM / Strength   AROM  / PROM / Strength  Strength      AROM   Overall AROM Comments  Trunk AROM and hip AROM/PROM grossly limited with pain, limied ability to assess for centralization/peripheralization given high pain level/poor movement tolerance    AROM Assessment Site  Lumbar    Lumbar Flexion  30    Lumbar Extension  10    Lumbar - Right Side Bend  20    Lumbar - Left Side Bend  20    Lumbar - Right Rotation  30%    Lumbar - Left Rotation  30%      Strength   Strength Assessment Site  Hip;Knee    Right/Left Hip  Right;Left    Right Hip Flexion  4+/5    Right Hip External Rotation   4/5    Right Hip Internal Rotation  4/5    Left Hip Flexion  5/5    Left Hip External Rotation  4/5    Left Hip Internal Rotation  4/5    Right/Left Knee  Right;Left    Right Knee Flexion  4+/5    Right Knee Extension  4/5    Left Knee Flexion  5/5    Left Knee Extension  4+/5    Left Ankle Dorsiflexion  4/5    Left Ankle Inversion  4/5    Left Ankle Eversion  4/5      Special Tests   Other special tests  (+) SLR bilat.                   [facility] PT Treatment/Exercise - 01/11/19 0001      Ambulation/Gait   Gait Comments  normally uses SPC but did not bring AD today      Exercises   Exercises  Lumbar      Lumbar Exercises: Stretches   Pelvic Tilt  10 reps    Prone on Elbows Stretch Limitations  prone on elbows x 2 min static hold    Press Ups  10 reps    Press Ups Limitations  prone press up on elbows      Modalities   Modalities  Moist Heat      Moist Heat Therapy   Number Minutes Moist Heat  15 Minutes   with estim   Moist Heat Location  Lumbar Spine      Electrical Stimulation   Electrical Stimulation Location  Lumbar    Electrical Stimulation Action  IFC    Electrical Stimulation Parameters  to tolerance  80-150 MHZ x 15 min    Electrical Stimulation Goals  Pain      Manual Therapy   Manual therapy comments  long axis distraction of ea. hip bilat. in supine, closed packed  position grade I-III, partial sidelying lumbopelvic mobilization ot right ASIS in partial rotation grade I-II             PT Education - 01/11/19 1543    Education Details  Potential symptom etiology, HEP, POC, cauda equina symptoms-go to ED/seek immediate medical attention if experiencing     Person(s) Educated  Patient    Methods  Explanation;Demonstration;Tactile cues;Verbal cues;Handout    Comprehension  Returned demonstration       PT Short Term Goals - 01/11/19 1600      PT SHORT TERM GOAL #1   Title  Centralize bilat. LE pain to lumbar region for ADLs without leg pain    Baseline  symptoms into bilat. buttocks and thighs    Time  3    Period  Weeks    Status  New      PT SHORT TERM GOAL #2   Title  Tolerate sitting/standing for periods 10-15 min ea. with pain <6/10 for improved positional + ADLs tolerance    Baseline  pain 8/10, difficulty tolerating    Time  3    Period  Weeks    Status  New        PT Long Term Goals - 01/11/19 1602      PT LONG TERM GOAL #1   Title  Independent with HEP    Baseline  (updated today for lumbar region)    Time  6    Period  Weeks    Status  On-going    Target Date  02/22/19      PT LONG TERM GOAL #2   Title  Improve FOTO outcome measure score to 56% or less impairment    Baseline  for foot, not retested today    Time  6    Period  Weeks    Status  On-going    Target Date  02/22/19      PT LONG TERM GOAL #3   Title  Increase right ankle AROM at least 5-10 deg in all directions to improve gait mechanics    Baseline  not tested today due to re-eval for LBP    Time  6    Period  Weeks    Status  On-going    Target Date  02/22/19      PT LONG TERM GOAL #4   Title  Right ankle strength at least grossly 4/5 to improve stability for gait over uneven surfaces    Baseline  limited ability to assess MMTs due to limited ROM    Time  6    Period  Weeks    Status  On-going    Target Date  02/22/19      PT LONG TERM GOAL  #5   Title  Community ambulation in ankle brace without need for [facility]    Baseline  uses SPC    Time  6    Period  Weeks    Status  On-going    Target Date  02/22/19      Additional Long Term Goals   Additional Long Term Goals  Yes      PT LONG TERM GOAL #6   Title  Increase trunk flexion AROM 20 deg or greater to improve ability to donn shoes  Baseline  30 deg    Time  6    Period  Weeks    Status  New    Target Date  02/22/19      PT LONG TERM GOAL #7   Title  Perform household chores, ADLs/IADLs as needed with LBP <5/10     Baseline  8/10    Time  6    Period  Weeks    Status  New    Target Date  02/22/19            Plan - 01/11/19 1551    Clinical Impression Statement  Pt. presents with acute on chronic LBP with intermittent bilat. LE radiating pain concerning for radiculopathy with past history disc herniation. No lateral shift noted. Limited tolerance standing ROM but tolerates prone so plan trial gentle extension bias exercises to centralize symptoms. Fair potential given chronic symptom history and high pain level/limited activity tolerance along wiyth comorbidities. Pt. does present with report of recent urinary urge incontinence, denies saddle parasthesias or urinary retention-pt. instructed to closely monitor and alert MD if symptoms continue/worsen.    Personal Factors and Comorbidities  Finances;Comorbidity 3+    Comorbidities  right foot issues/surgical history, diabetes, neuropathy, HTN, chronic LBP history    Examination-Activity Limitations  Sleep;Dressing;Sit;Transfers;Squat;Bathing;Hygiene/Grooming;Lift;Stairs;Stand;Locomotion Level    Examination-Participation Restrictions  Shop;Laundry;Cleaning;Community Activity;Meal Prep;Personal Finances    Stability/Clinical Decision Making  Evolving/Moderate complexity    Clinical Decision Making  Moderate    Rehab Potential  Fair    Clinical Impairments Affecting Rehab Potential  chronic symptom history, high pain  level and poor activity tolerance, bilat. leg symptoms, comorbidities    PT Frequency  --   1-2x/week   PT Duration  6 weeks    PT Treatment/Interventions  ADLs/Self Care Home Management;Cryotherapy;Ultrasound;Moist Heat;Iontophoresis 4mg /ml Dexamethasone;Electrical Stimulation;Gait training;Functional mobility training;Neuromuscular re-education;Therapeutic exercise;Therapeutic activities;Patient/family education;Manual techniques;Taping;Vasopneumatic Device;Traction;Dry needling    PT Next Visit Plan  check response prone on elbows vs. prone press up, gentle extension bias ROM, manual therapy, possible trial traction, modalities prn    PT Home Exercise Plan  lumbar: prone on elbows, prone press up, pelvi tilt, for foot: towel scrunches, towel EV/IV, gastroc stretch, isometrics for EV/IV,DF, PF    Consulted and Agree with Plan of Care  Patient       Patient will benefit from skilled therapeutic intervention in order to improve the following deficits and impairments:  Obesity, Impaired flexibility, Difficulty walking, Decreased activity tolerance, Decreased endurance, Decreased range of motion, Decreased strength, Hypomobility, Pain, Impaired sensation, Abnormal gait  Visit Diagnosis: Pain in right ankle and joints of right foot  Chronic low back pain with sciatica, sciatica laterality unspecified, unspecified back pain laterality  Stiffness of right ankle, not elsewhere classified  Difficulty in walking, not elsewhere classified  Muscle weakness (generalized)     Problem List Patient Active Problem List   Diagnosis Date Noted  . Financial difficulties 04/13/2018  . Seasonal allergies 02/06/2017  . Osteoarthritis of right ankle 08/29/2016  . Tobacco abuse 01/21/2016  . Severe obesity (BMI 35.0-39.9) 10/12/2015  . Foot ulcer, right ([place]) 09/13/2015  . Ovarian cyst 12/24/2012  . Right knee pain 12/09/2012  . Hypertension 07/20/2012  . Healthcare maintenance 06/27/2011  .  Uncontrolled type 2 diabetes mellitus ([place]) 05/30/2010  . Dyslipidemia 05/30/2010  . 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    Beaulah Dinning, PT, DPT 01/11/19 4:07 PM  [facility] [address] [place], [place], [address] Phone: [phone]  Fax:  [phone]  Name: Tina [name] MRN: [id number] Date of Birth: 19-Aug-1971

## 2019-01-11 NOTE — Telephone Encounter (Signed)
-----   Message from [name], DPM sent at 01/11/2019  9:14 AM EDT ----- Can you please order a functional capacity test? Thanks.

## 2019-01-12 NOTE — Progress Notes (Signed)
Subjective: Tina [name] presents to the office today today for follow-up evaluation of a wound to the right foot as well as her bilateral foot pain.  Overall she states that she is doing about the same.  She been doing physical therapy but not see much improvement.  She continues to get pain on the left second toe on the calluses on the left foot as well.  She also feels that her right ankle continues to catch.  [place] brace has been helpful for her however she continues to get discomfort and swelling.  She having difficulty standing for long periods of time.  She also is relating sciatica and back pain as well.  [name].  [name] denies any systemic complaints such as fevers, chills, nausea, vomiting. No acute changes since last appointment, and no other complaints at this time.   Objective: AAO x3, NAD DP/PT pulses palpable bilaterally, CRT less than 3 seconds The wounds well-healed on her feet. There is a significant decrease in medial arch height bilaterally with bunion deformity as well as hammertoe contractures present.  On the left foot there is less symptomatic painful thick hyperkeratotic lesion on the medial aspect of the left second toe as well as left submetatarsal 2 and left fifth metatarsal head laterally.  Upon debridement there is no underlying ulceration, drainage or any signs of infection noted to this area today. In regards to her right ankle she is continuing to wear the [place] brace.  This is helpful however her ankle still catches at times. 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  -I debrided the hyperkeratotic lesions x3 without any complications or bleeding. -Continue the [place] brace on the right  side.  When he finished physical therapy.  Also will order a functional capacity test for her. -Continue gabapentin for now. -Finish physical therapy.  She is seeing neurology next week. -Given her foot pain she cannot stand for long periods time she is not able to go to work.  We discussed a sitting job however given her sciatica and other issues unable to sit for long periods of time.  [name].  She has an upcoming court appearance in June.  [name] DPM

## 2019-01-18 ENCOUNTER — Encounter (INDEPENDENT_AMBULATORY_CARE_PROVIDER_SITE_OTHER): Payer: Self-pay

## 2019-01-18 ENCOUNTER — Other Ambulatory Visit (HOSPITAL_COMMUNITY)
Admission: RE | Admit: 2019-01-18 | Discharge: 2019-01-18 | Disposition: A | Discharge status: Home / Self Care | Payer: Self-pay | Source: Ambulatory Visit | Attending: Student in an Organized Health Care Education/Training Program | Admitting: Student in an Organized Health Care Education/Training Program

## 2019-01-18 ENCOUNTER — Ambulatory Visit (INDEPENDENT_AMBULATORY_CARE_PROVIDER_SITE_OTHER): Payer: Self-pay | Admitting: Internal Medicine

## 2019-01-18 ENCOUNTER — Other Ambulatory Visit: Payer: Self-pay | Admitting: Internal Medicine

## 2019-01-18 ENCOUNTER — Other Ambulatory Visit: Payer: Self-pay

## 2019-01-18 ENCOUNTER — Encounter: Payer: Self-pay | Admitting: Internal Medicine

## 2019-01-18 VITALS — BP 172/73 | HR 102 | Temp 97.7°F | Ht 76.0 in | Wt 295.4 lb

## 2019-01-18 DIAGNOSIS — N760 Acute vaginitis: Secondary | ICD-10-CM

## 2019-01-18 DIAGNOSIS — E118 Type 2 diabetes mellitus with unspecified complications: Secondary | ICD-10-CM

## 2019-01-18 DIAGNOSIS — N898 Other specified noninflammatory disorders of vagina: Secondary | ICD-10-CM

## 2019-01-18 LAB — POCT URINALYSIS DIPSTICK
Glucose, UA: POSITIVE — AB
Ketones, UA: 15
Leukocytes, UA: NEGATIVE
Nitrite, UA: NEGATIVE
PH UA: 5 (ref 5.0–8.0)
Protein, UA: NEGATIVE
Spec Grav, UA: >=1.03 — AB (ref 1.010–1.025)
Urobilinogen, UA: 1 E.U./dL

## 2019-01-18 MED ORDER — FLUCONAZOLE 150 MG PO TABS: 150.0000 mg | ORAL_TABLET | Freq: Every day | ORAL | 0 refills | Status: DC

## 2019-01-18 MED FILL — FLUCONAZOLE 150 MG TABS: 150 | 4 days supply | Qty: 2 | Fill #0

## 2019-01-18 NOTE — Progress Notes (Signed)
Internal Medicine Clinic Attending  Case discussed with Dr. Hoffman at the time of the visit.  We reviewed the resident's history and exam and pertinent patient test results.  I agree with the assessment, diagnosis, and plan of care documented in the resident's note.  

## 2019-01-18 NOTE — Assessment & Plan Note (Signed)
History of present illness: Patient presents with a 2 day history of vaginal discomfort that she describes as constant burning and itching. When she urinates the burning is worse. She is currently sexually active with only women and  partner denies symptoms.  Assessment: Vaginal discomfort Patient had a urine dipstick that showed negative nitrites and leukocytes.  Pelvic exam was done in office and showed white curd like discharge.   Patient is an uncontrolled diabetic.  Will send diflucan to the pharmacy for 2 doses.   Plan -Pelvic exam checking for GC/chlamydia, bacterial vaginosis, Trichomonas and candidiasis.

## 2019-01-18 NOTE — Patient Instructions (Signed)
Ms. Tejada,  It was a pleasure meeting you today. Please take Diflucan 1 pill today followed by 1 more pill 3 days later.

## 2019-01-18 NOTE — Progress Notes (Signed)
Internal Medicine Clinic Attending  Case discussed with Dr. Santos-Sanchez at the time of the visit.  We reviewed the resident's history and exam and pertinent patient test results.  I agree with the assessment, diagnosis, and plan of care documented in the resident's note.    

## 2019-01-18 NOTE — Progress Notes (Signed)
   CC: vaginal burning and itching  HPI:  [name] is a 48 y.o. female with history noted below that presents to the acute care clinic for vaginal burning and itching. Please see problem based charting for the status of patient's acute medical conditions.  Past Medical History:  Diagnosis Date  . Diabetes mellitus    type II  . Fibroids   . HTN (hypertension)   . Hyperlipidemia   . Iron deficiency anemia   . Left acetabular fracture ([place])   . Lumbar strain   . Obesity   . Ovarian cyst     Review of Systems:  Review of Systems  Constitutional: Negative for chills, fever and malaise/fatigue.  Gastrointestinal: Negative for nausea and vomiting.    Physical Exam:  Vitals:   01/18/19 0902  BP: (!) 172/73  Pulse: (!) 102  Temp: 97.7 F (36.5 C)  TempSrc: Oral  SpO2: 100%  Weight: 295 lb 6.4 oz (134 kg)   Physical Exam  Constitutional: She is well-developed, well-nourished, and in no distress.  HENT:  Mouth/Throat: Oropharynx is clear and moist. No oropharyngeal exudate.  Cardiovascular: Normal rate, regular rhythm and normal heart sounds. Exam reveals no gallop and no friction rub.  No murmur heard. Pulmonary/Chest: Effort normal and breath sounds normal. No respiratory distress. She has no wheezes. She has no rales.     Assessment & Plan:   See encounters tab for problem based medical decision making.   Patient discussed with Dr. Evette Doffing

## 2019-01-19 LAB — CERVICOVAGINAL ANCILLARY ONLY
Bacterial vaginitis: NEGATIVE
CANDIDA VAGINITIS: POSITIVE — AB
Chlamydia: NEGATIVE
Neisseria Gonorrhea: NEGATIVE
Trichomonas: NEGATIVE

## 2019-01-24 ENCOUNTER — Encounter: Payer: Self-pay | Admitting: Dietician

## 2019-01-31 ENCOUNTER — Other Ambulatory Visit: Payer: Self-pay | Admitting: Internal Medicine

## 2019-01-31 ENCOUNTER — Telehealth: Payer: Self-pay | Admitting: Internal Medicine

## 2019-01-31 MED ORDER — FLUCONAZOLE 150 MG PO TABS: 150.0000 mg | ORAL_TABLET | Freq: Every day | ORAL | 0 refills | Status: DC

## 2019-01-31 NOTE — Telephone Encounter (Signed)
Pt not getting better. Pt still itching, burning and White discharge since last visit on 01/18/2019

## 2019-01-31 NOTE — Telephone Encounter (Signed)
Can you please let Ms. Yoakum know that I sent one more dose of diflucan to the pharmacy.  If she still has symptoms after taking the medicine then she will need to come back in for another exam as her previous only showed yeast.

## 2019-01-31 NOTE — Telephone Encounter (Signed)
Patient notified. She will call back to schedule ACC visit if symptoms remain after taking one additional dose of diflucan. Hubbard Hartshorn, RN, BSN

## 2019-02-08 ENCOUNTER — Other Ambulatory Visit: Payer: Self-pay

## 2019-02-08 ENCOUNTER — Ambulatory Visit (INDEPENDENT_AMBULATORY_CARE_PROVIDER_SITE_OTHER): Payer: MEDICAID | Admitting: Podiatry

## 2019-02-08 ENCOUNTER — Encounter: Payer: Self-pay | Admitting: Podiatry

## 2019-02-08 VITALS — Temp 98.3°F

## 2019-02-08 DIAGNOSIS — M19071 Primary osteoarthritis, right ankle and foot: Secondary | ICD-10-CM

## 2019-02-08 DIAGNOSIS — E1149 Type 2 diabetes mellitus with other diabetic neurological complication: Secondary | ICD-10-CM

## 2019-02-08 DIAGNOSIS — L84 Corns and callosities: Secondary | ICD-10-CM

## 2019-02-08 NOTE — Progress Notes (Signed)
Subjective: Tina [name] presents to the office today today for follow-up evaluation of a wound to the right foot as well as her bilateral foot pain.  She states that she is having quite a bit of pain to both of her second toes on the area of the callus.  She denies any skin breakdown she denies any swelling or redness or any drainage or pus.  Her ankle is not been swollen and that she has been off of her foot more. Denies any systemic complaints such as fevers, chills, nausea, vomiting. No acute changes since last appointment, and no other complaints at this time.   In general she is been feeling very anxious about intermittent in her family members.  They put her into anxiety medication.  I encouraged her to contact her psychiatrist if needed.  Objective: AAO x3, NAD DP/PT pulses palpable bilaterally, CRT less than 3 seconds The wounds well-healed on her feet. There is a significant decrease in medial arch height bilaterally with bunion deformity as well as hammertoe contractures present.  On the left foot there is less symptomatic painful thick hyperkeratotic lesion on the medial aspect of the left second toe and the sulcus of the right 2nd and left submetatarsal 2 and left fifth metatarsal head laterally.  Upon debridement there is no underlying ulceration, drainage or any signs of infection noted to this area today. In regards to her right ankle she is continuing to wear the [place] brace.  This is helpful however her ankle still catches at times. 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  -I debrided the hyperkeratotic lesions x4 without any complications or bleeding. New offloading pads dispnesed.  -Continue the [place] brace on the right side.  When he finished  physical therapy.  Also will order a functional capacity test for her. -Continue gabapentin for now. -Her neurology and PT appointments have been rescheduled due to [place] her foot pain she cannot stand for long periods time she is not able to go to work.  We discussed a sitting job however given her sciatica and other issues unable to sit for long periods of time.  [name].  She has an upcoming court appearance in June.  [name] DPM

## 2019-02-09 MED FILL — ENALAPRIL MALEATE 20 MG TAB: 20 | 30 days supply | Qty: 30 | Fill #0 | Status: TO

## 2019-02-09 MED FILL — PIOGLITAZONE HCL 30 MG TAB: 30 | 30 days supply | Qty: 30 | Fill #0 | Status: TO

## 2019-02-18 ENCOUNTER — Ambulatory Visit: Payer: Self-pay | Admitting: Neurology

## 2019-02-28 MED FILL — JANUMET 50-1,000 MG TABLET: 50-1000 | 30 days supply | Qty: 60 | Fill #0

## 2019-02-28 MED FILL — ATORVASTATIN 40 MG TABLET: 40 | 30 days supply | Qty: 30 | Fill #0 | Status: TO

## 2019-02-28 MED FILL — GABAPENTIN 800 MG TABS: 800 | 30 days supply | Qty: 90 | Fill #0

## 2019-03-01 ENCOUNTER — Ambulatory Visit (INDEPENDENT_AMBULATORY_CARE_PROVIDER_SITE_OTHER): Payer: Self-pay | Admitting: Internal Medicine

## 2019-03-01 ENCOUNTER — Telehealth: Payer: Self-pay | Admitting: Internal Medicine

## 2019-03-01 ENCOUNTER — Other Ambulatory Visit: Payer: Self-pay

## 2019-03-01 DIAGNOSIS — B3731 Acute candidiasis of vulva and vagina: Secondary | ICD-10-CM

## 2019-03-01 DIAGNOSIS — B373 Candidiasis of vulva and vagina: Secondary | ICD-10-CM

## 2019-03-01 MED ORDER — FLUCONAZOLE 150 MG PO TABS: 150.0000 mg | ORAL_TABLET | Freq: Every day | ORAL | 0 refills | Status: DC

## 2019-03-01 MED FILL — FLUCONAZOLE 150 MG TABS: 150 | 2 days supply | Qty: 2 | Fill #0

## 2019-03-01 NOTE — Telephone Encounter (Signed)
Please schedule a televisit

## 2019-03-01 NOTE — Telephone Encounter (Signed)
Pt with white d/c, burning during urination,feels dehydrated and drinking a lot of water.  Pt would like a call back about having another uti

## 2019-03-01 NOTE — Telephone Encounter (Signed)
Pt has a tele health visit with per PCP today.

## 2019-03-02 ENCOUNTER — Encounter: Payer: Self-pay | Admitting: Internal Medicine

## 2019-03-02 DIAGNOSIS — B373 Candidiasis of vulva and vagina: Secondary | ICD-10-CM | POA: Insufficient documentation

## 2019-03-02 DIAGNOSIS — B3731 Acute candidiasis of vulva and vagina: Secondary | ICD-10-CM | POA: Insufficient documentation

## 2019-03-02 NOTE — Assessment & Plan Note (Addendum)
Patient reports several weeks of white cottage cheese vaginal discharge associated with surrounding skin irritation as well as itching.  She denies urinary symptoms.  She was treated for vaginal candidiasis 6 weeks ago with a one-time dose of Diflucan.  States her symptoms improved for a few days and then returned.  She has uncontrolled type 2 diabetes and reports she has not been compliant with a low carbohydrate diet.  I suspect her recurrent vaginal candidiasis infections are due to uncontrolled diabetes.  Will prescribe Diflucan but advised patient this will likely continue to occur if her DM remains uncontrolled. She has not been checking her BGs at home. I asked her to record them for the next 2 weeks. Will call back then and adjust her DM medication regimen.  - Diflucan 150 mg x2 (Day 1 and 3) - DM follow up in 2 weeks, may need to resume insulin then if patient agreeable

## 2019-03-02 NOTE — Progress Notes (Signed)
  [facility] Internal Medicine Residency Telephone Encounter Continuity Care Appointment  HPI:   This telephone encounter was created for Tina [name] on 03/02/2019 for the following purpose/cc: Evaluation for vaginal discharge.   Past Medical History:  Past Medical History:  Diagnosis Date  . Diabetes mellitus    type II  . Fibroids   . HTN (hypertension)   . Hyperlipidemia   . Iron deficiency anemia   . Left acetabular fracture ([place])   . Lumbar strain   . Obesity   . Ovarian cyst       ROS:   Patient endorses vaginal irritation, pruritus, and discharge.  Denies dysuria, hematuria, urinary urgency or increased frequency.  She denies fevers, chills, abdominal pain, and nausea vomiting..   Assessment / Plan / Recommendations:   Please see A&P under problem oriented charting for assessment of the patient's acute and chronic medical conditions.   As always, pt is advised that if symptoms worsen or new symptoms arise, they should go to an urgent care facility or to to ER for further evaluation.   Consent and Medical Decision Making:   Patient discussed with Dr. Dareen Piano  This is a telephone encounter between Tina [name] and Charnel Giles Santos-Sanchez on 03/02/2019 for medical conditions stated above. The visit was conducted with the patient located at home and Welford Roche at [facility]. The patient's identity was confirmed using their DOB and current address. The patient has consented to being evaluated through a telephone encounter and understands the associated risks (an examination cannot be done and the patient may need to come in for an appointment) / benefits (allows the patient to remain at home, decreasing exposure to coronavirus). I personally spent 5 minutes on medical discussion.

## 2019-03-03 NOTE — Progress Notes (Signed)
Internal Medicine Clinic Attending  Case discussed with Dr. Santos-Sanchez at the time of the visit.  We reviewed the resident's history and exam and pertinent patient test results.  I agree with the assessment, diagnosis, and plan of care documented in the resident's note.    

## 2019-03-08 ENCOUNTER — Ambulatory Visit: Payer: MEDICAID | Admitting: Podiatry

## 2019-03-11 ENCOUNTER — Ambulatory Visit: Payer: MEDICAID | Admitting: Podiatry

## 2019-03-14 ENCOUNTER — Other Ambulatory Visit: Payer: Self-pay

## 2019-03-14 ENCOUNTER — Ambulatory Visit (INDEPENDENT_AMBULATORY_CARE_PROVIDER_SITE_OTHER): Payer: MEDICAID | Admitting: Podiatry

## 2019-03-14 ENCOUNTER — Encounter: Payer: Self-pay | Admitting: Podiatry

## 2019-03-14 VITALS — Temp 97.9°F

## 2019-03-14 DIAGNOSIS — E1149 Type 2 diabetes mellitus with other diabetic neurological complication: Secondary | ICD-10-CM

## 2019-03-14 DIAGNOSIS — M7751 Other enthesopathy of right foot: Secondary | ICD-10-CM

## 2019-03-14 DIAGNOSIS — L84 Corns and callosities: Secondary | ICD-10-CM

## 2019-03-16 NOTE — Progress Notes (Signed)
Subjective: Tina [name] presents to the office today today for follow-up evaluation of a wound to the right foot as well as her bilateral foot pain.  She states that the right ankle still catches at times.  She is wearing an [place] which does help some.  She gets some swelling to the ankle as well mostly along the anterior medial aspect she points to.  She also has a painful callus on the left second toe she is also developing a callus interdigitally between the first and second toe on the right side which is causing pain.  She has no other concerns today.  She denies any drainage or pus or any swelling or redness to her feet.  Denies any fevers, chills, nausea, vomiting.  No calf pain, chest pain, shortness of breath.  Objective: AAO x3, NAD DP/PT pulses palpable bilaterally, CRT less than 3 seconds The wounds well-healed on her feet. There is a significant decrease in medial arch height bilaterally with bunion deformity as well as hammertoe contractures present.  On the left foot there is less symptomatic painful thick hyperkeratotic lesion on the medial aspect of the left second toe and the sulcus of the right 2nd.  On the first interspace of the right foot around the hyperkeratotic lesion there is some macerated tissue present but there is no drainage or pus.   There is localized edema to the anterior medial aspect ankle joint there is discomfort at this area.  Reduction of ankle joint range of motion. In regards to her right ankle she is continuing to wear the [place] brace.  This is helpful however her ankle still catches at times. 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  -I debrided the hyperkeratotic lesions x2 without any complications or bleeding. New  offloading pads dispnesed.  -Steroid injection performed the right ankle today.  See procedure note below. -Continue the [place] brace on the right side.  When he finished physical therapy.  Also will order a functional capacity test for her. -Continue gabapentin for now. -Her neurology and PT appointments have been rescheduled due to [place] her foot pain she cannot stand for long periods time she is not able to go to work.  We discussed a sitting job however given her sciatica and other issues unable to sit for long periods of time.  [name].  She has an upcoming court appearance in June.  Procedure: Injection intermediate joint Discussed alternatives, risks, complications and verbal consent was obtained.  Location: Right ankle Skin Prep: Betadine Injectate: 0.5cc 0.5% marcaine plain, 0.5 cc 2% lidocaine plain and, 1 cc kenalog 10. Disposition: Patient tolerated procedure well. Injection site dressed with a band-aid.  Post-injection care was discussed and return precautions discussed.    Tina [name] DPM

## 2019-03-18 ENCOUNTER — Other Ambulatory Visit: Payer: Self-pay

## 2019-03-18 ENCOUNTER — Ambulatory Visit (INDEPENDENT_AMBULATORY_CARE_PROVIDER_SITE_OTHER): Payer: Self-pay | Admitting: Internal Medicine

## 2019-03-18 DIAGNOSIS — E1165 Type 2 diabetes mellitus with hyperglycemia: Secondary | ICD-10-CM

## 2019-03-21 ENCOUNTER — Encounter: Payer: Self-pay | Admitting: Internal Medicine

## 2019-03-21 NOTE — Progress Notes (Signed)
Internal Medicine Clinic Attending  Case discussed with Dr. Isac Sarna soon after the resident saw the patient.  We reviewed the resident's history, telephone conversation and pertinent patient test results.  I agree with the assessment, diagnosis, and plan of care documented in the resident's note.

## 2019-03-21 NOTE — Progress Notes (Signed)
  [facility] Internal Medicine Residency Telephone Encounter Continuity Care Appointment  HPI:   This telephone encounter was created for [name] on 03/21/2019 for the following purpose/cc follow-up for diabetes.   Past Medical History:  Past Medical History:  Diagnosis Date  . Diabetes mellitus    type II  . Fibroids   . HTN (hypertension)   . Hyperlipidemia   . Iron deficiency anemia   . Left acetabular fracture ([place])   . Lumbar strain   . Obesity   . Ovarian cyst       ROS:      Assessment / Plan / Recommendations:   Please see A&P under problem oriented charting for assessment of the patient's acute and chronic medical conditions.   As always, pt is advised that if symptoms worsen or new symptoms arise, they should go to an urgent care facility or to to ER for further evaluation.   Consent and Medical Decision Making:   Patient discussed with Dr. Daryll Drown  This is a telephone encounter between Tina [name] and Tina [name] on 03/21/2019 for medical conditions stated above. The visit was conducted with the patient located at home and Tina [name] at [facility]. The patient's identity was confirmed using their DOB and current address. The patient has consented to being evaluated through a telephone encounter and understands the associated risks (an examination cannot be done and the patient may need to come in for an appointment) / benefits (allows the patient to remain at home, decreasing exposure to coronavirus). I personally spent 8 minutes on medical discussion.

## 2019-03-21 NOTE — Assessment & Plan Note (Signed)
Ms. Kuehne reports noncompliance with low carbohydrate diet in the past few weeks.  She has been checking her blood sugars at home which have been between 150 and 295.  She is on Actos and Janumet, issues with affordability have limited diabetic regimen in the past. She used to be on Levemir with very good glycemic control but could not afford it. However, she is uninsured and should be able to obtain it for $4 at [facility]. She has a vial at home that has not expired. This was provided by Dr. Maudie Mercury during a precious visit. I asked her to start Levemir 10 units at night and titrate up by 2 units if morning CBG > 150. Will follow up in 1 week.

## 2019-03-25 ENCOUNTER — Encounter: Payer: Self-pay | Admitting: Internal Medicine

## 2019-03-25 ENCOUNTER — Ambulatory Visit (INDEPENDENT_AMBULATORY_CARE_PROVIDER_SITE_OTHER): Payer: Self-pay | Admitting: Internal Medicine

## 2019-03-25 ENCOUNTER — Other Ambulatory Visit: Payer: Self-pay

## 2019-03-25 DIAGNOSIS — E1165 Type 2 diabetes mellitus with hyperglycemia: Secondary | ICD-10-CM

## 2019-03-25 NOTE — Progress Notes (Signed)
  [facility] Internal Medicine Residency Telephone Encounter Continuity Care Appointment  HPI:   This telephone encounter was created for Ms. Nelson Julson Griffitts on 03/25/2019 for the following purpose/cc: Follow up of T2DM.   Past Medical History:  Past Medical History:  Diagnosis Date  . Diabetes mellitus    type II  . Fibroids   . HTN (hypertension)   . Hyperlipidemia   . Iron deficiency anemia   . Left acetabular fracture ([place])   . Lumbar strain   . Obesity   . Ovarian cyst       ROS:      Assessment / Plan / Recommendations:   Please see A&P under problem oriented charting for assessment of the patient's acute and chronic medical conditions.   As always, pt is advised that if symptoms worsen or new symptoms arise, they should go to an urgent care facility or to to ER for further evaluation.   Consent and Medical Decision Making:   Patient discussed with Dr. Daryll Drown  This is a telephone encounter between Enrique Sack and Len Azeez Santos-Sanchez on 03/25/2019 for medical conditions stated above. The visit was conducted with the patient located at home and Welford Roche at [facility]. The patient's identity was confirmed using their DOB and current address. The patient has consented to being evaluated through a telephone encounter and understands the associated risks (an examination cannot be done and the patient may need to come in for an appointment) / benefits (allows the patient to remain at home, decreasing exposure to coronavirus). I personally spent 7 minutes on medical discussion.

## 2019-03-25 NOTE — Assessment & Plan Note (Signed)
Called patient to follow up on T2DM. She started Levemir 10 units last week due to persistent hyperglycemia in the setting of dietary indiscretions. She reports feeling better and resolution of urinary symptoms (urgency and frequency). CBG in the 120-130s. No episodes or signs/symptoms of hypoglycemia. Will continue Levemir at current dose. She has a whole box of this. Will plan to transition to Lantus eventually when she finishes the Levemir she has at home as this is the long-acting insulin available for $4 at [facility]. Patient agreeable to this.

## 2019-03-29 NOTE — Progress Notes (Signed)
Internal Medicine Clinic Attending  Case discussed with Dr. Isac Sarna soon after the resident saw the patient.  We reviewed the resident's history, telephone conversation and pertinent patient test results.  I agree with the assessment, diagnosis, and plan of care documented in the resident's note.

## 2019-04-06 MED FILL — PIOGLITAZONE HCL 30 MG TAB: 30 | 30 days supply | Qty: 30 | Fill #0

## 2019-04-06 MED FILL — ENALAPRIL MALEATE 20 MG TAB: 20 | 30 days supply | Qty: 30 | Fill #0

## 2019-04-06 MED FILL — ATORVASTATIN 40 MG TABLET: 40 | 30 days supply | Qty: 30 | Fill #0

## 2019-04-11 ENCOUNTER — Other Ambulatory Visit: Payer: Self-pay

## 2019-04-11 ENCOUNTER — Ambulatory Visit (INDEPENDENT_AMBULATORY_CARE_PROVIDER_SITE_OTHER): Payer: Self-pay | Admitting: Neurology

## 2019-04-11 ENCOUNTER — Encounter: Payer: Self-pay | Admitting: Neurology

## 2019-04-11 VITALS — BP 112/74 | HR 100 | Ht 76.0 in | Wt 300.0 lb

## 2019-04-11 DIAGNOSIS — M5417 Radiculopathy, lumbosacral region: Secondary | ICD-10-CM

## 2019-04-11 DIAGNOSIS — E1142 Type 2 diabetes mellitus with diabetic polyneuropathy: Secondary | ICD-10-CM

## 2019-04-11 MED ORDER — DULOXETINE HCL 30 MG PO CPEP: 30.0000 mg | ORAL_CAPSULE | Freq: Every day | ORAL | 3 refills | Status: DC

## 2019-04-11 MED FILL — DULoxetine HCL 30 MG CPEP: 30 | 30 days supply | Qty: 30 | Fill #0

## 2019-04-11 NOTE — Patient Instructions (Addendum)
Start Cymbalta 30mg  daily  Continue gabapentin 800mg  three times daily  Check labs  Return to clinic in 4 months

## 2019-04-11 NOTE — Progress Notes (Signed)
[facility] Clinic Note - Initial Visit   Date: 04/11/19  LETANYA FROH MRN: [id number] DOB: May 04, 1971   Dear Dr.  Isac Sarna:  Thank you for your kind referral of Tina [name] for consultation of neuropathy. Although her history is well known to you, please allow [place] to reiterate it for the purpose of our medical record. The patient was accompanied to the clinic by self.    History of Present Illness: Tina [name] is a 48 y.o. right-handed female with poorly controlled diabetes mellitus (HbA1c 8.6), hypertension, hyperlipidemia, tobacco use, and chronic right foot pain presenting for evaluation of neuropathy.   She has been diabetic since around 2015.  Starting 2-3 year ago, she started having numbness/tingling from her low back into the right leg. MRI lumbar spine from 2005 shows disc protrusion at L5-S1 contacting the right S1 nerve root.  She has seen sports medicine and was offered ESI, which she declined.  Currently, she is on physical therapy for low back strengthening.   She also has tingling/burning and numbness over the soles of her feet. Pain is constant and alleviated by nothing.  She takes gabapentin 889m TID for bilateral foot pain.  She was previously seen pain management and was being prescribed percocet.  She has imbalance and walks with a cane, some of which is contributed by her chronic right foot pain.  She had had prior ankle fusion and poor wound healing of this foot.   She denies leg weakness but fatigues quickly.   Out-side paper records, electronic medical record, and images have been reviewed where available and summarized as:  MRI lumbar spine without contrast 11/22/2003:  1. Moderate sized right paracentral disk protrusion L5-S1 contacting the right S1 nerve root.   2. Annular rent at L2-3 with a very shallow central disk protrusion.  3. Minimal bulging disks at L3-4 and L4-5.  Lab Results  Component Value  Date   HGBA1C 8.6 (A) 01/07/2019   Lab Results  Component Value Date   TSH 1.074 06/27/2011   Lab Results  Component Value Date   ESRSEDRATE 27 (H) 05/25/2017    Past Medical History:  Diagnosis Date  . Diabetes mellitus    type II  . Fibroids   . HTN (hypertension)   . Hyperlipidemia   . Iron deficiency anemia   . [place]   . Lumbar strain   . Obesity   . Ovarian cyst     Past Surgical History:  Procedure Laterality Date  . FOOT ARTHRODESIS, MODIFIED MCBRIDE     right foot bunion surgery and ankle surgery 1980s  . 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 albuterol (PROVENTIL HFA;VENTOLIN HFA) 108 (90 Base) MCG/ACT inhaler Inhale 1-2 puffs into the lungs every 6 (six) hours as needed for wheezing or shortness of breath.  .Marland Kitchenatorvastatin (LIPITOR) 40 MG tablet Take 1 tablet (40 mg total) by mouth daily.  . Blood Glucose Monitoring Suppl (CONTOUR NEXT ONE) KIT 1 each by Does not apply route See admin instructions. USE TO CHECK BLOOD GLUCOSE ONCE DAILY. IM PROGRAM.  . diclofenac (VOLTAREN) 75 MG EC tablet TAKE 1 TABLET BY MOUTH 2 TIMES DAILY.  .Marland Kitchendiclofenac sodium (VOLTAREN) 1 % GEL Apply 2 g topically 4 (four) times daily.  . enalapril (VASOTEC) 20 MG tablet Take 1 tablet (20 mg  total) by mouth daily. IMTS program.  . gabapentin (NEURONTIN) 800 MG tablet Take 1 tablet (800 mg total) by mouth 3 (three) times daily.  . Insulin Detemir (LEVEMIR [place]) Inject 10 Units into the skin at bedtime.  . lamoTRIgine (LAMICTAL) 100 MG tablet Take 100 mg by mouth 2 (two) times daily.   . Multiple Vitamins-Minerals (MULTIVITAMIN WITH MINERALS) tablet Take 1 tablet by mouth daily.    . pioglitazone (ACTOS) 30 MG tablet TAKE 1 TABLET BY MOUTH DAILY. IM PROGRAM  . sertraline (ZOLOFT) 100 MG tablet Take 100 mg by mouth daily.  . sitaGLIPtin-metformin (JANUMET)  50-1000 MG tablet Take 1 tablet by mouth 2 (two) times daily with a meal. IM program.  . traZODone (DESYREL) 50 MG tablet Take 50 mg by mouth at bedtime.  . [DISCONTINUED] fluconazole (DIFLUCAN) 150 MG tablet Take 1 tablet (150 mg total) by mouth daily.  . [DISCONTINUED] naproxen (NAPROSYN) 500 MG tablet Take 1 tablet (500 mg total) by mouth 2 (two) times daily.  . DULoxetine (CYMBALTA) 30 MG capsule Take 1 capsule (30 mg total) by mouth daily.   No facility-administered encounter medications on file as of 04/11/2019.     Allergies:  Allergies  Allergen Reactions  . Codeine Other (See Comments)    hallucinations    Family History: Family History  Problem Relation Age of Onset  . Colon cancer Sister   . Diabetes Mother   . Diabetes Father   . Diabetes Brother   . Celiac disease Paternal Aunt     Social History: Social History   Tobacco Use  . Smoking status: Current Every Day Smoker    Packs/day: 0.50    Years: 23.00    Pack years: 11.50    Types: Cigarettes  . Smokeless tobacco: Never Used  . Tobacco comment: 1/2PPD  Substance Use Topics  . Alcohol use: Yes    Alcohol/week: 0.0 standard drinks    Comment: occ  . Drug use: No   Social History   Social History Narrative   Homosexual; in a monogamous relationship w/ homosexual woman.   She is working on getting disability.  She last worked in 2018 at [place].    No smoking, no drugs, no alcohol    Review of Systems:  CONSTITUTIONAL: No fevers, chills, night sweats, or weight loss.   EYES: No visual changes or eye pain ENT: No hearing changes.  No history of nose bleeds.   RESPIRATORY: No cough, wheezing and shortness of breath.   CARDIOVASCULAR: Negative for chest pain, and palpitations.   GI: Negative for abdominal discomfort, blood in stools or black stools.  No recent change in bowel habits.   GU:  No history of incontinence.   MUSCLOSKELETAL: +history of joint pain or swelling.  No myalgias.   SKIN: Negative  for lesions, rash, and itching.   HEMATOLOGY/ONCOLOGY: Negative for prolonged bleeding, bruising easily, and swollen nodes.  No history of cancer.   ENDOCRINE: Negative for cold or heat intolerance, polydipsia or goiter.   PSYCH:  +depression or anxiety symptoms.   NEURO: As Above.   Vital Signs:  BP 112/74   Pulse 100   Ht _0  (1.93 m)   Wt 300 lb (136.1 kg)   SpO2 98%   BMI 36.52 kg/m    General Medical Exam:   General:  Well appearing, comfortable.   Eyes/ENT: see cranial nerve examination.   Neck:   No carotid bruits. Respiratory:  Clear to auscultation, good air entry bilaterally.  Cardiac:  Regular rate and rhythm, no murmur.   Extremities:  Bilateral pes planus with bilateral bunion deformities.   Skin:  No rashes or lesions.  Neurological Exam: MENTAL STATUS including orientation to time, place, person, recent and remote memory, attention span and concentration, language, and fund of knowledge is normal.  Speech is not dysarthric.  CRANIAL NERVES: II:  No visual field defects.  Unremarkable fundi.   III-IV-VI: Pupils equal round and reactive to light.  Normal conjugate, extra-ocular eye movements in all directions of gaze.  No nystagmus.  No ptosis.   V:  Normal facial sensation.    VII:  Normal facial symmetry and movements.   VIII:  Normal hearing and vestibular function.   IX-X:  Normal palatal movement.   XI:  Normal shoulder shrug and head rotation.   XII:  Normal tongue strength and range of motion, no deviation or fasciculation.  MOTOR:  No atrophy, fasciculations or abnormal movements.  No pronator drift.  Reduced ROM with right foot extension/flexion  Upper Extremity:  Right  Left  Deltoid  5/5   5/5   Biceps  5/5   5/5   Triceps  5/5   5/5   Infraspinatus 5/5  5/5  Medial pectoralis 5/5  5/5  Wrist extensors  5/5   5/5   Wrist flexors  5/5   5/5   Finger extensors  5/5   5/5   Finger flexors  5/5   5/5   Dorsal interossei  5/5   5/5   Abductor  pollicis  5/5   5/5   Tone ([place] scale)  0  0   Lower Extremity:  Right  Left  Hip flexors  5/5   5/5   Hip extensors  5/5   5/5   Adductor 5/5  5/5  Abductor 5/5  5/5  Knee flexors  5/5   5/5   Knee extensors  5/5   5/5   Dorsiflexors  5-/5   5/5   Plantarflexors   5-/5   5/5   Toe extensors   5-/5   5/5   Toe flexors  5-/5   5/5   Tone ([place] scale)  0  0   MSRs:  Right        Left                  brachioradialis 2+  2+  biceps 2+  2+  triceps 2+  2+  patellar 0  0  ankle jerk 0  0  plantar response down  down   SENSORY:  Vibration is reduced to 50% at the ankle and at the toes.  Temperature and pin prick is reduced from mid-calf into the feet.    COORDINATION/GAIT: Normal finger-to- nose-finger.  Intact rapid alternating movements bilaterally.  Gait is wide-based, slow, assisted with a cane.    IMPRESSION/PLAN: 1. Diabetic neuropathy affecting the feet with painful paresthesias.    - Check vitamin B12, vitamin B1, folate  - Continue gabapentin 818m TID - maximal dose  - Start Cymbalta 338mdaily  - She has regular follow-up with podiatry monitoring her skin lesions  2. Lumbosacral radiculopathy affecting right S1 nerve root, stable.  - She has seen Sport Medicine and was recommended ESI, if symptoms progress.     - Continue physical therapy  Return to clinic in 4 months.    Thank you for allowing me to participate in patient's care.  If I can answer any additional questions, I  would be pleased to do so.    Sincerely,    Defne Gerling K. Posey Pronto, DO

## 2019-04-12 ENCOUNTER — Ambulatory Visit (INDEPENDENT_AMBULATORY_CARE_PROVIDER_SITE_OTHER): Payer: MEDICAID | Admitting: Podiatry

## 2019-04-12 ENCOUNTER — Ambulatory Visit (INDEPENDENT_AMBULATORY_CARE_PROVIDER_SITE_OTHER): Payer: MEDICAID

## 2019-04-12 ENCOUNTER — Encounter: Payer: Self-pay | Admitting: Podiatry

## 2019-04-12 VITALS — Temp 96.4°F

## 2019-04-12 DIAGNOSIS — M19071 Primary osteoarthritis, right ankle and foot: Secondary | ICD-10-CM

## 2019-04-12 DIAGNOSIS — M779 Enthesopathy, unspecified: Secondary | ICD-10-CM

## 2019-04-12 DIAGNOSIS — Q828 Other specified congenital malformations of skin: Secondary | ICD-10-CM

## 2019-04-12 DIAGNOSIS — M7751 Other enthesopathy of right foot: Secondary | ICD-10-CM

## 2019-04-12 DIAGNOSIS — E1149 Type 2 diabetes mellitus with other diabetic neurological complication: Secondary | ICD-10-CM

## 2019-04-12 LAB — B12 AND FOLATE PANEL
Folate: 12.9 ng/mL
Vitamin B-12: 529 pg/mL (ref 200–1100)

## 2019-04-12 NOTE — Progress Notes (Addendum)
Subjective: Tina [name] presents to the office today today for follow-up evaluation of a wound to the right foot as well as her bilateral foot pain.  Overall she states that she still getting chronic foot pain.  She did see Dr. Posey Pronto yesterday and she was diagnosed with nerve damage to her feet.  She states that she has neuropathy which are new but she also has back issues contributing to her nerve pain.  She is at the next visit gabapentin she was started on Cymbalta.  Her main concern is she still gets quite a bit of pain to a callus, second lesion on her right foot and in between her big and second toe as well as her left second toe.  Denies any swelling, redness or drainage or any signs of infection. Denies any fevers, chills, nausea, vomiting.  No calf pain, chest pain, shortness of breath.  Objective: AAO x3, NAD DP/PT pulses palpable bilaterally, CRT less than 3 seconds The wounds well-healed on her feet. Hyperkeratotic tissue at the sulcus of the first interspace of the right foot as well as the medial left second toe.  Upon debridement there is no ongoing ulceration, drainage or any signs of infection.  No open lesions.  Hyperkeratotic lesions also submetatarsal area left foot as well as lateral fifth met head.  Again no signs of infections.  Significant flatfoot deformities present with severe bunion deformity as well as hammertoe contracture.  There is previous right foot triple arthrodesis.  Some discomfort of the ankle joint.  No other areas of tenderness.  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  -X-rays were obtained reviewed.  Significant flatfoot deformities present.  Bunion as well as enterococcus was present.  Arthritic changes present the first PJ with the right side worse than left.  Arthritic changes present  right ankle. -I debrided the hyperkeratotic lesions x4 without any complications or bleeding. New offloading pads dispnesed.  -Discussed offloading.  Offloading pads were dispensed.  Unfortunately given the arthritis she is having inside of her toes difficult for me this is very typical as the pressure. -Due to her chronic foot pain she cannot stand for long periods of time. Due to this and her other medical conditions she is not able to return to work.   RTC 4 weeks  [name] DPM

## 2019-04-13 ENCOUNTER — Other Ambulatory Visit: Payer: Self-pay

## 2019-04-13 ENCOUNTER — Encounter (HOSPITAL_BASED_OUTPATIENT_CLINIC_OR_DEPARTMENT_OTHER): Payer: Self-pay | Admitting: Emergency Medicine

## 2019-04-13 ENCOUNTER — Emergency Department (HOSPITAL_BASED_OUTPATIENT_CLINIC_OR_DEPARTMENT_OTHER)
Admission: EM | Admit: 2019-04-13 | Discharge: 2019-04-13 | Disposition: A | Discharge status: Home / Self Care | Payer: Self-pay | Attending: Emergency Medicine | Admitting: Emergency Medicine

## 2019-04-13 DIAGNOSIS — Z79899 Other long term (current) drug therapy: Secondary | ICD-10-CM | POA: Insufficient documentation

## 2019-04-13 DIAGNOSIS — I10 Essential (primary) hypertension: Secondary | ICD-10-CM | POA: Insufficient documentation

## 2019-04-13 DIAGNOSIS — Z794 Long term (current) use of insulin: Secondary | ICD-10-CM | POA: Insufficient documentation

## 2019-04-13 DIAGNOSIS — H109 Unspecified conjunctivitis: Secondary | ICD-10-CM | POA: Insufficient documentation

## 2019-04-13 DIAGNOSIS — E119 Type 2 diabetes mellitus without complications: Secondary | ICD-10-CM | POA: Insufficient documentation

## 2019-04-13 DIAGNOSIS — F17210 Nicotine dependence, cigarettes, uncomplicated: Secondary | ICD-10-CM | POA: Insufficient documentation

## 2019-04-13 MED ORDER — ERYTHROMYCIN 5 MG/GM OP OINT
TOPICAL_OINTMENT | Freq: Once | OPHTHALMIC | Status: AC
  Administered 2019-04-13: 14:00:00 via OPHTHALMIC
  Filled 2019-04-13: qty 3.5

## 2019-04-13 MED ORDER — ERYTHROMYCIN 5 MG/GM OP OINT: TOPICAL_OINTMENT | OPHTHALMIC | 0 refills | Status: AC

## 2019-04-13 NOTE — ED Notes (Signed)
ED Provider at bedside. 

## 2019-04-13 NOTE — ED Provider Notes (Signed)
[facility] EMERGENCY DEPARTMENT Provider Note   CSN: [id number] Arrival date & time: 04/13/19  1356    History   Chief Complaint Chief Complaint  Patient presents with  . Eye Problem    HPI DARINA HARTWELL is a 48 y.o. female.     The history is provided by the patient.  Eye Problem  Location:  Right eye Quality:  Tearing and burning Severity:  Mild Onset quality:  Gradual Duration:  3 days Timing:  Constant Progression:  Worsening Chronicity:  New Context: not contact lens problem   Context comment:  Right eye with redness, crusting over in the morning over the last several days.  Relieved by:  Nothing Worsened by:  Nothing Associated symptoms: crusting, discharge, inflammation, redness and tearing   Associated symptoms: no itching, no photophobia, no swelling and no vomiting     Past Medical History:  Diagnosis Date  . Diabetes mellitus    type II  . Fibroids   . HTN (hypertension)   . Hyperlipidemia   . Iron deficiency anemia   . Left acetabular fracture ([place])   . Lumbar strain   . Obesity   . Ovarian cyst     Patient Active Problem List   Diagnosis Date Noted  . Recurrent vaginal candidiasis 03/02/2019  . Financial difficulties 04/13/2018  . Seasonal allergies 02/06/2017  . Osteoarthritis of right ankle 08/29/2016  . Tobacco abuse 01/21/2016  . Severe obesity (BMI 35.0-39.9) 10/12/2015  . Foot ulcer, right ([place]) 09/13/2015  . Ovarian cyst 12/24/2012  . Right knee pain 12/09/2012  . Hypertension 07/20/2012  . Healthcare maintenance 06/27/2011  . Uncontrolled type 2 diabetes mellitus ([place]) 05/30/2010  . Dyslipidemia 05/30/2010  . 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    Past Surgical History:  Procedure Laterality Date  . FOOT ARTHRODESIS, MODIFIED [facility]     right foot bunion surgery and ankle surgery 1980s  . 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 Authorizing Provider  albuterol (PROVENTIL HFA;VENTOLIN HFA) 108 (90 Base) MCG/ACT inhaler Inhale 1-2 puffs into the lungs every 6 (six) hours as needed for wheezing or shortness of breath. 10/10/18   Petrucelli, [name], PA-C  atorvastatin (LIPITOR) 40 MG tablet Take 1 tablet (40 mg total) by mouth daily. 04/13/18   Maryellen Pile, MD  Blood Glucose Monitoring Suppl (CONTOUR NEXT ONE) KIT 1 each by Does not apply route See admin instructions. USE TO CHECK BLOOD GLUCOSE ONCE DAILY. IM PROGRAM. 01/25/18   [name], MD  diclofenac (VOLTAREN) 75 MG EC tablet TAKE 1 TABLET BY MOUTH 2 TIMES DAILY. 09/06/18   [name], DPM  diclofenac sodium (VOLTAREN) 1 % GEL Apply 2 g topically 4 (four) times daily. 08/29/16   Asencion Partridge, MD  DULoxetine (CYMBALTA) 30 MG capsule Take 1 capsule (30 mg total) by mouth daily. 04/11/19  Patel, Donika K, DO  enalapril (VASOTEC) 20 MG tablet Take 1 tablet (20 mg total) by mouth daily. IMTS program. 04/13/18   Maryellen Pile, MD  erythromycin ophthalmic ointment Place a 1/2 inch ribbon of ointment into the lower eyelid. 04/13/19 04/23/19  Vincent Ehrler, DO  gabapentin (NEURONTIN) 800 MG tablet Take 1 tablet (800 mg total) by mouth 3 (three) times daily. 01/07/19   Santos-Sanchez, Merlene Morse, MD  Insulin Detemir (LEVEMIR [place]) Inject 10 Units into the skin at bedtime.    [provider]  lamoTRIgine (LAMICTAL) 100 MG tablet Take 100 mg by mouth 2 (two) times daily.     [provider]  Multiple Vitamins-Minerals (MULTIVITAMIN WITH MINERALS) tablet Take 1 tablet by mouth daily.      [provider]  pioglitazone (ACTOS) 30 MG tablet TAKE 1 TABLET BY MOUTH DAILY. IM PROGRAM 09/06/18    Welford Roche, MD  sertraline (ZOLOFT) 100 MG tablet Take 100 mg by mouth daily.    [provider]  sitaGLIPtin-metformin (JANUMET) 50-1000 MG tablet Take 1 tablet by mouth 2 (two) times daily with a meal. IM program. 09/09/18   Welford Roche, MD  traZODone (DESYREL) 50 MG tablet Take 50 mg by mouth at bedtime.    [provider]    Family History Family History  Problem Relation Age of Onset  . Colon cancer Sister   . Diabetes Mother   . Diabetes Father   . Diabetes Brother   . Celiac disease Paternal Aunt     Social History Social History   Tobacco Use  . Smoking status: Current Every Day Smoker    Packs/day: 0.50    Years: 23.00    Pack years: 11.50    Types: Cigarettes  . Smokeless tobacco: Never Used  . Tobacco comment: 1/2PPD  Substance Use Topics  . Alcohol use: Yes    Alcohol/week: 0.0 standard drinks    Comment: occ  . Drug use: No     Allergies   Codeine   Review of Systems Review of Systems  Constitutional: Negative for chills and fever.  HENT: Negative for ear pain and sore throat.   Eyes: Positive for discharge and redness. Negative for photophobia, pain, itching and visual disturbance.  Respiratory: Negative for cough and shortness of breath.   Cardiovascular: Negative for chest pain and palpitations.  Gastrointestinal: Negative for abdominal pain and vomiting.  Genitourinary: Negative for dysuria and hematuria.  Musculoskeletal: Negative for arthralgias and back pain.  Skin: Negative for color change and rash.  Neurological: Negative for seizures and syncope.  All other systems reviewed and are negative.    Physical Exam Updated Vital Signs  ED Triage Vitals  Enc Vitals Group     BP 04/13/19 1401 128/67     Pulse Rate 04/13/19 1401 90     Resp 04/13/19 1401 16     Temp 04/13/19 1401 98.2 F (36.8 C)     Temp Source 04/13/19 1401 Oral     SpO2 04/13/19 1401 97 %     Weight 04/13/19 1403 299 lb 13.2 oz  (136 kg)     Height 04/13/19 1403 '6\' 4"'  (1.93 m)     Head Circumference --      Peak Flow --      Pain Score 04/13/19 1404 8     Pain Loc --      Pain Edu? --      Excl. in [place]? --     Physical Exam Vitals signs  and nursing note reviewed.  Constitutional:      General: She is not in acute distress.    Appearance: She is well-developed. She is not ill-appearing.  HENT:     Head: Normocephalic and atraumatic.     Nose: Nose normal.     Mouth/Throat:     Mouth: Mucous membranes are moist.  Eyes:     Extraocular Movements: Extraocular movements intact.     Pupils: Pupils are equal, round, and reactive to light.     Comments: Right conjunctiva is inflamed, left conjunctival normal, pupils are equal bilaterally and reactive, there is no fixed/dilated pupil on exam, there is no consensual photophobia, no swelling around the eye, 20/50 vision bilaterally  Neck:     Musculoskeletal: Normal range of motion and neck supple.  Skin:    General: Skin is dry.     Capillary Refill: Capillary refill takes less than 2 seconds.     Findings: No rash.  Neurological:     Mental Status: She is alert.      ED Treatments / Results  Labs (all labs ordered are listed, but only abnormal results are displayed) Labs Reviewed - No data to display  EKG None  Radiology Dg Foot Complete Left  Result Date: 04/12/2019 Please see detailed radiograph report in office note.  Dg Foot Complete Right  Result Date: 04/12/2019 Please see detailed radiograph report in office note.   Procedures Procedures (including critical care time)  Medications Ordered in ED Medications  erythromycin ophthalmic ointment ( Right Eye Given 04/13/19 1416)     Initial Impression / Assessment and Plan / ED Course  I have reviewed the triage vital signs and the nursing notes.  Pertinent labs & imaging results that were available during my care of the patient were reviewed by me and considered in my medical decision  making (see chart for details).        NEL STONEKING is a 48 year old female history of diabetes who presents the ED with right eye redness.  Patient with unremarkable vitals.  No fever.  Patient with eye redness and crusting over the right eye over the last 3 days.  She states that when she wakes up in the morning eyes crusted over.  Denies any foreign body sensation, no trauma to the eye.  Less likely corneal abrasion will treat with antibiotic ointment.  Patient denies any severe pain or pressure.  Does not wear contacts.  Patient with redness to the conjunctiva, no consensual photophobia, no fixed or dilated pupil. 20/50 vision b/l.  There is no signs to suggest glaucoma, orbital cellulitis.  Extraocular movements are intact.  Overall clinical picture is consistent with conjunctivitis.  Patient given erythromycin ointment.  However, recommend that patient follow-up closely with eye doctor as she should have dilated exam/slit lamp exam as this could be uveitis. Gave her number for eye doctor on call. Patient states that she also has her own eye doctor that she thinks she can see later today or tomorrow.  Told her to return to the ED if symptoms worsen or unable to see eye doctor by end of tomorrow for repeat exam.  Patient discharged from ED in good condition.  Final Clinical Impressions(s) / ED Diagnoses   Final diagnoses:  Conjunctivitis of right eye, unspecified conjunctivitis type    ED Discharge Orders         Ordered    erythromycin ophthalmic ointment     04/13/19 1416  Lennice Sites, DO 04/13/19 1434

## 2019-04-13 NOTE — ED Triage Notes (Signed)
Redness, pain and swelling of right eye x3 days with drainage.

## 2019-04-14 ENCOUNTER — Ambulatory Visit (INDEPENDENT_AMBULATORY_CARE_PROVIDER_SITE_OTHER): Payer: Self-pay | Admitting: Internal Medicine

## 2019-04-14 ENCOUNTER — Encounter: Payer: Self-pay | Admitting: Internal Medicine

## 2019-04-14 VITALS — BP 139/82 | HR 85 | Temp 98.1°F | Ht 76.0 in | Wt 273.7 lb

## 2019-04-14 DIAGNOSIS — H1031 Unspecified acute conjunctivitis, right eye: Secondary | ICD-10-CM

## 2019-04-14 DIAGNOSIS — H109 Unspecified conjunctivitis: Secondary | ICD-10-CM | POA: Insufficient documentation

## 2019-04-14 DIAGNOSIS — D219 Benign neoplasm of connective and other soft tissue, unspecified: Secondary | ICD-10-CM

## 2019-04-14 DIAGNOSIS — Z8742 Personal history of other diseases of the female genital tract: Secondary | ICD-10-CM

## 2019-04-14 LAB — POCT URINE PREGNANCY: Preg Test, Ur: NEGATIVE

## 2019-04-14 MED ORDER — MEDROXYPROGESTERONE ACETATE 150 MG/ML IM SUSP
150.0000 mg | Freq: Once | INTRAMUSCULAR | Status: AC
  Administered 2019-04-14: 150 mg via INTRAMUSCULAR

## 2019-04-14 NOTE — Progress Notes (Signed)
   CC: Eye pain, Depo-provera  HPI:  [name] is a 48 y.o. F with PMHx listed below presenting for Eye pain, Depo-provera. Please see the A&P for the status of the patient's chronic medical problems.  Past Medical History:  Diagnosis Date  . Diabetes mellitus    type II  . Fibroids   . HTN (hypertension)   . Hyperlipidemia   . Iron deficiency anemia   . Left acetabular fracture ([place])   . Lumbar strain   . Obesity   . Ovarian cyst    Review of Systems: Performed and all others negative.  Physical Exam:  Vitals:   04/14/19 0903  BP: (!) 145/87  Pulse: 88  Temp: 98.1 F (36.7 C)  TempSrc: Oral  SpO2: 100%  Weight: 273 lb 11.2 oz (124.1 kg)  Height: 6\' 4"  (1.93 m)   Physical Exam Constitutional:      General: She is not in acute distress.    Appearance: Normal appearance.  Eyes:     General: Vision grossly intact. No visual field deficit.       Right eye: No foreign body or discharge.     Extraocular Movements:     Right eye: Normal extraocular motion.     Left eye: Normal extraocular motion.     Conjunctiva/sclera:     Right eye: Right conjunctiva is not injected. No exudate.    Comments: Mild R eyelid swelling. Mild decease in visual acuity on right.   Cardiovascular:     Rate and Rhythm: Normal rate and regular rhythm.     Pulses: Normal pulses.     Heart sounds: Normal heart sounds.  Pulmonary:     Effort: Pulmonary effort is normal. No respiratory distress.     Breath sounds: Normal breath sounds.  Abdominal:     General: Bowel sounds are normal. There is no distension.     Palpations: Abdomen is soft.     Tenderness: There is no abdominal tenderness.  Musculoskeletal:        General: No swelling or deformity.  Skin:    General: Skin is warm and dry.  Neurological:     General: No focal deficit present.     Mental Status: Mental status is at baseline.     Assessment & Plan:   See Encounters Tab for problem based charting.   Patient discussed with Dr. Evette Doffing

## 2019-04-14 NOTE — Patient Instructions (Signed)
Thank you for allowing [place] to care for you  For you eye redness ans eyelid pain - Any infection of the area appears to have resolved - You can stop using the antibiotic ointment - Inflammation should continue to improve over the next few days - Please follow up if symptoms fail to improve or worsen in the next few days  Depo-Provera injection provided today

## 2019-04-14 NOTE — Assessment & Plan Note (Addendum)
Patient seen at [facility] yesterday. Prior to this she reports 3-4 days of decreased redness, visual acuity, eyelid pain of right eye. She had associated crusting and drainage of the eye. She was diagnosed with conjunctivitis and treated for bacterial infection with erythromycin ointment. She presents today for follow and to see if she can stop or change ointment as it is irritating her eye.  Eye is much improved today. She has some residual decrease in visual acuity on the right as well as mild right eyelid swelling. Her cunjectiva looks much improved from what she describes yesterday. There is no drainage. It is unclear if her conjunctivitis was due to true bacterial infection versus viral infection, but it appears to have been treated as signs and symptoms have significantly improved. She is to stop erythromycin ointment at this time. - Stop erythromycin ointment - Continue to monitor, suspect symptoms to continue to improve in the coming days - return precautions given

## 2019-04-14 NOTE — Assessment & Plan Note (Signed)
Patient reports a history of fibroids and heavy periods leading to her being started on Depo provera. It has been 3 months since last injection and she is due for another. POC Ur pregnancy test negative. - Depo-Provera 150mg  IM

## 2019-04-15 NOTE — Progress Notes (Signed)
Internal Medicine Clinic Attending  I saw and evaluated the patient.  I personally confirmed the key portions of the history and exam documented by Dr. Melvin and I reviewed pertinent patient test results.  The assessment, diagnosis, and plan were formulated together and I agree with the documentation in the resident's note.  

## 2019-04-16 LAB — VITAMIN B1: Vitamin B1 (Thiamine): 7 nmol/L — ABNORMAL LOW (ref 8–30)

## 2019-05-02 ENCOUNTER — Encounter: Payer: Self-pay | Admitting: Internal Medicine

## 2019-05-09 ENCOUNTER — Other Ambulatory Visit: Payer: Self-pay | Admitting: *Deleted

## 2019-05-09 ENCOUNTER — Ambulatory Visit (INDEPENDENT_AMBULATORY_CARE_PROVIDER_SITE_OTHER): Payer: Self-pay | Admitting: Internal Medicine

## 2019-05-09 ENCOUNTER — Encounter: Payer: Self-pay | Admitting: Internal Medicine

## 2019-05-09 ENCOUNTER — Other Ambulatory Visit: Payer: Self-pay

## 2019-05-09 VITALS — BP 143/73 | HR 100 | Temp 99.4°F | Wt 263.5 lb

## 2019-05-09 DIAGNOSIS — L97519 Non-pressure chronic ulcer of other part of right foot with unspecified severity: Secondary | ICD-10-CM

## 2019-05-09 DIAGNOSIS — L97512 Non-pressure chronic ulcer of other part of right foot with fat layer exposed: Secondary | ICD-10-CM

## 2019-05-09 DIAGNOSIS — E1169 Type 2 diabetes mellitus with other specified complication: Secondary | ICD-10-CM

## 2019-05-09 DIAGNOSIS — E11621 Type 2 diabetes mellitus with foot ulcer: Secondary | ICD-10-CM

## 2019-05-09 DIAGNOSIS — E1165 Type 2 diabetes mellitus with hyperglycemia: Secondary | ICD-10-CM

## 2019-05-09 DIAGNOSIS — R3915 Urgency of urination: Secondary | ICD-10-CM

## 2019-05-09 DIAGNOSIS — R350 Frequency of micturition: Secondary | ICD-10-CM

## 2019-05-09 DIAGNOSIS — Z794 Long term (current) use of insulin: Secondary | ICD-10-CM

## 2019-05-09 LAB — POCT GLYCOSYLATED HEMOGLOBIN (HGB A1C): HbA1c POC (<> result, manual entry): >14 % — AB (ref 4.0–5.6)

## 2019-05-09 LAB — GLUCOSE, CAPILLARY: Glucose-Capillary: 392 mg/dL — ABNORMAL HIGH (ref 70–99)

## 2019-05-09 MED ORDER — LANCETS MISC: 1.0000 [IU] | Freq: Three times a day (TID) | 3 refills | Status: DC

## 2019-05-09 MED ORDER — CONTOUR NEXT TEST VI STRP: ORAL_STRIP | 3 refills | Status: DC

## 2019-05-09 MED ORDER — LANTUS SOLOSTAR 100 UNIT/ML ~~LOC~~ SOPN: 25.0000 [IU] | PEN_INJECTOR | Freq: Every day | SUBCUTANEOUS | 3 refills | Status: DC

## 2019-05-09 MED ORDER — INSULIN ASPART 100 UNIT/ML FLEXPEN: 5.0000 [IU] | PEN_INJECTOR | Freq: Three times a day (TID) | SUBCUTANEOUS | 3 refills | Status: DC

## 2019-05-09 MED FILL — NOVOLOG FLEXPEN SYRINGE: 100 | 20 days supply | Qty: 3 | Fill #0

## 2019-05-09 MED FILL — CONTOUR NEXT STRIPS: 30 days supply | Qty: 100 | Fill #0

## 2019-05-09 MED FILL — MICROLET LANCETS MISC: 30 days supply | Qty: 100 | Fill #0

## 2019-05-09 MED FILL — LANTUS SOLOSTAR 100 UNITS/M: 100 | 24 days supply | Qty: 6 | Fill #0

## 2019-05-09 NOTE — Assessment & Plan Note (Addendum)
Patient presents for T2DM. Tina [name] has been struggling with hyperglycemia at home for the past month. We have been uptitrating her Levemir over the phone (telehelath visits) but BGs remain high in the 300s. She has been doing 10-15 units of Levemir daily + Actos and Janumet. She has not been checking her BG at home. She is in the process of moving and lost her meter. She also ran out of Actos. A1c > 14. Will make the following adjustments to her regimen given worsening hyperglycemia. Discussed symptoms of hypoglycemia and advised to call us if any.  - Lantus 25U QHS - Novolog 5U BID or TID with meals  - STOP Levemir  - STOP Actos (struggling with fluid retention in ankles) - Meter and strips provided  - Follow up in 1 week with Butch Penny over the phone and me in 4 weeks

## 2019-05-09 NOTE — Patient Instructions (Addendum)
Tina [name],   Please STOP taking Levemir and Actos.  Start taking Lantus 25 units daily in the morning or at night, whenever yo uprefer.  Please start taking NovoLog 5 units with meals.  You may skip the dose if you skip a meal to prevent low blood sugar.  If you are having symptoms of low blood sugar you may dose the NovoLog 2 times daily meals instead of 3 times a day with meals.  Follow-up appointment with me in 4 weeks to check up on your diabetes.  Sure to bring your meter with you so we can download and review it.  Call us if you have any questions or concerns.  - Dr. Frederico Hamman

## 2019-05-11 ENCOUNTER — Encounter: Payer: Self-pay | Admitting: Internal Medicine

## 2019-05-11 MED ORDER — UNIFINE PENTIPS 31G X 5 MM MISC: 1.0000 [IU] | Freq: Three times a day (TID) | 5 refills | Status: DC

## 2019-05-11 MED FILL — UNIFINE PENTIPS 31GX3/16: 31G X 5 MM | 25 days supply | Qty: 100 | Fill #0

## 2019-05-11 MED FILL — UNIFINE PENTIPS 31GX3/16": 31G X 5 MM | 25 days supply | Qty: 100 | Fill #0

## 2019-05-11 NOTE — Assessment & Plan Note (Signed)
Patient presenting with recurrent R foot ulcer in the setting of foot deformity. Ulcer does not appear infected. She denies fever and chills. She already follows up with podiatry.  - Will monitor for signs and symptoms of infection  - Follow up with podiatry

## 2019-05-11 NOTE — Progress Notes (Signed)
   CC: T2DM follow up  HPI:  [name] is a 48 y.o. year-old female with PMH listed below who presents to clinic for T2DM follow up. Please see problem based assessment and plan for further details.   Past Medical History:  Diagnosis Date  . Diabetes mellitus    type II  . Fibroids   . HTN (hypertension)   . Hyperlipidemia   . Iron deficiency anemia   . Left acetabular fracture ([place])   . Lumbar strain   . Obesity   . Ovarian cyst    Review of Systems:   Review of Systems  Constitutional: Negative for chills, fever and weight loss.  Gastrointestinal: Negative for abdominal pain, nausea and vomiting.  Genitourinary: Positive for frequency and urgency.  Neurological: Negative for dizziness and headaches.    Physical Exam:  Vitals:   05/09/19 1416  BP: (!) 143/73  Pulse: 100  Temp: 99.4 F (37.4 C)  TempSrc: Oral  SpO2: 99%  Weight: 263 lb 8 oz (119.5 kg)    General: well-appearing female, in no acute distress  Cardiac: regular rate and rhythm, nl S1/S2, no murmurs, rubs or gallops Pulm: CTAB, no wheezes or crackles, no increased work of breathing on room air  Ext:  R foot wound between 1st and 2nd toe with visible SQ fat but no signs of infection     Assessment & Plan:   See Encounters Tab for problem based charting.  Patient discussed with Dr. Daryll Drown

## 2019-05-13 NOTE — Progress Notes (Signed)
Internal Medicine Clinic Attending  Case discussed with Dr. Santos-Sanchez soon after the resident saw the patient.  We reviewed the resident's history and exam and pertinent patient test results.  I agree with the assessment, diagnosis, and plan of care documented in the resident's note.   

## 2019-05-16 ENCOUNTER — Telehealth: Payer: Self-pay | Admitting: Neurology

## 2019-05-16 MED ORDER — DULOXETINE HCL 60 MG PO CPEP: 60.0000 mg | ORAL_CAPSULE | Freq: Every day | ORAL | 5 refills | Status: DC

## 2019-05-16 MED FILL — DULoxetine HCL 60 MG CPEP: 60 | 30 days supply | Qty: 30 | Fill #0

## 2019-05-16 NOTE — Telephone Encounter (Signed)
Attempted to contact patient to inform her Cymbalta was increased.

## 2019-05-16 NOTE — Telephone Encounter (Signed)
New Message  Pt c/o medication issue:  1. Name of Medication:  Duloxetine (Cymbalta) 30 mg capsule once daily  2. How are you currently taking this medication (dosage and times per day)?  See above  3. Are you having a reaction (difficulty breathing--STAT)? No  4. What is your medication issue?   Pt still in a lot of pain and want to know if the MG can be increased from 30 to 60 MG per her PCP

## 2019-05-16 NOTE — Telephone Encounter (Signed)
Cymbalta increased to 60mg  daily, Rx sent.

## 2019-05-16 NOTE — Telephone Encounter (Signed)
Patient informed of medication change in the office.

## 2019-05-17 ENCOUNTER — Encounter: Payer: Self-pay | Admitting: Dietician

## 2019-05-17 ENCOUNTER — Ambulatory Visit (INDEPENDENT_AMBULATORY_CARE_PROVIDER_SITE_OTHER): Payer: Self-pay | Admitting: Podiatry

## 2019-05-17 ENCOUNTER — Other Ambulatory Visit: Payer: Self-pay

## 2019-05-17 VITALS — Temp 97.2°F

## 2019-05-17 DIAGNOSIS — M19071 Primary osteoarthritis, right ankle and foot: Secondary | ICD-10-CM

## 2019-05-17 DIAGNOSIS — Q828 Other specified congenital malformations of skin: Secondary | ICD-10-CM

## 2019-05-17 DIAGNOSIS — E1149 Type 2 diabetes mellitus with other diabetic neurological complication: Secondary | ICD-10-CM

## 2019-05-18 ENCOUNTER — Other Ambulatory Visit: Payer: Self-pay | Admitting: Internal Medicine

## 2019-05-18 DIAGNOSIS — I10 Essential (primary) hypertension: Secondary | ICD-10-CM

## 2019-05-18 MED FILL — GABAPENTIN 800 MG TABLET: 800 | 30 days supply | Qty: 90 | Fill #0

## 2019-05-18 MED FILL — JANUMET 50-1,000 MG TABLET: 50-1000 | 30 days supply | Qty: 60 | Fill #0

## 2019-05-19 MED FILL — ENALAPRIL MALEATE 20 MG TAB: 20 | 30 days supply | Qty: 30 | Fill #0

## 2019-05-19 MED FILL — ATORVASTATIN 40 MG TABLET: 40 | 30 days supply | Qty: 30 | Fill #0

## 2019-05-23 MED FILL — DULoxetine HCL 30 MG CPEP: 30 | 30 days supply | Qty: 30 | Fill #1

## 2019-05-25 NOTE — Progress Notes (Signed)
Subjective: Tina [name] presents to the office today today for follow-up evaluation of a wound to the right foot as well as her bilateral foot pain.  She also still gets a skin lesion between her big toe and second toe on the right foot.  Denies any drainage or pus or any swelling or any opening to the areas.  She still this morning pain of the right ankle as well.  Her Cymbalta has been increased.  She did just when her disability claim.  This is still in process. Denies any swelling, redness or drainage or any signs of infection. Denies any fevers, chills, nausea, vomiting.  No calf pain, chest pain, shortness of breath.  Objective: AAO x3, NAD DP/PT pulses palpable bilaterally, CRT less than 3 seconds The wounds well-healed on her feet. Hyperkeratotic tissue at the sulcus of the first interspace of the right foot as well as the medial left second toe.  Upon debridement there is no ongoing ulceration, drainage or any signs of infection.  No open lesions.  Hyperkeratotic lesions also submetatarsal area left foot as well as lateral fifth met head.  Again no signs of infections.  Significant flatfoot deformities present with severe bunion deformity as well as hammertoe contracture.  There is previous right foot triple arthrodesis.  Some discomfort of the ankle joint.  No other areas of tenderness. 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  -I debrided the hyperkeratotic lesions x4 without any complications or bleeding. New offloading pads dispnesed.  -Continue offloading at all times.  Make sure she dries thoroughly in between her toes. -Continue [place] brace on the right side.  She presents today still wearing flip-flops.  Discussed wearing more supportive shoes.  RTC 4 weeks  [name] DPM

## 2019-05-26 ENCOUNTER — Telehealth: Payer: Self-pay | Admitting: Dietician

## 2019-05-26 NOTE — Telephone Encounter (Signed)
Called Ms. [name] per request of Dr,. Isac Sarna:  ----- Message -----  From: Welford Roche, MD  Sent: 05/11/2019  8:04 AM EDT  To: Chauncey Reading Jalyn Dutta, RD  Subject: DM follow up over phone              HI Butch Penny!   I was wondering if you could give this patient a call in about 1 week to check how she is doing on her new insulin regimen and see if hse needs any adjustments before she sees me in 4 weeks.   Thanks,  Duffy Bruce

## 2019-05-26 NOTE — Telephone Encounter (Signed)
Ms Sillas reported:  Blood sugars:  checking 2x/day 150-250, none lower than 150, feels her blood sugars are still too high, but verbalizes she understands it may take a while for them to be lower.  Denies symptoms of low blood sugar, but reports that she is still urinating frequently, and has had a headache since starting new insulin.  Medicines: she says she started her second Lantus pen- only two in box- taking 25 units daily.  Novolog she is taking  5 units 2-3 times a day depending on if she eats or only drinks juice Janumet 1 pill two times a day. reviewed action, onset, duration of insulins. confirmed she is abel to afford her medicine. Sleep: not sleeping, trazadone is not working. Says she thinks this is due to family stresses.  Activity: limited by her foot problems. Encouraged/discussed non-weight bearing activities.    Plan: she agreed to increase in Novolog to 6 units before each meal. Encouraged her to eat a third meal and take a third dose rather than only drink juice and skip a dose. She verbalized understanding. Follow up by phone in 1 week.

## 2019-06-03 MED FILL — LANTUS SOLOSTAR 100 UNITS/M: 100 | 24 days supply | Qty: 6 | Fill #1

## 2019-06-03 MED FILL — NOVOLOG FLEXPEN SYRINGE: 100 | 20 days supply | Qty: 3 | Fill #1

## 2019-06-06 ENCOUNTER — Telehealth: Payer: Self-pay | Admitting: *Deleted

## 2019-06-06 NOTE — Telephone Encounter (Signed)
Pt states she is having a lot of pain in her right ankle, and the left toe, and she would like pain medication.

## 2019-06-06 NOTE — Telephone Encounter (Signed)
Tina [name] - scheduler changed pt's appt on 06/14/2019 from Dr. Stephenie Acres schedule to Dr. Leigh Aurora and transferred to me to discuss current pain. I spoke with pt and she states the pain in the left 2nd toe is from the callous and the right ankle has a catching pain even when she walks in the boot.

## 2019-06-07 ENCOUNTER — Other Ambulatory Visit: Payer: Self-pay | Admitting: Podiatry

## 2019-06-07 MED ORDER — TRAMADOL HCL 50 MG PO TABS: 50.0000 mg | ORAL_TABLET | Freq: Three times a day (TID) | ORAL | 0 refills | Status: AC | PRN

## 2019-06-07 MED FILL — traMADol HCL 50 MG TABS: 50 | 5 days supply | Qty: 15 | Fill #0

## 2019-06-07 NOTE — Telephone Encounter (Signed)
I informed pt of Dr. Leigh Aurora orders.

## 2019-06-07 NOTE — Telephone Encounter (Signed)
Tramadol sent to the pharmacy for her.   She has been on this before and tolerated.

## 2019-06-09 ENCOUNTER — Telehealth: Payer: Self-pay | Admitting: Dietician

## 2019-06-10 NOTE — Telephone Encounter (Signed)
Tried calling this patient. Their voicemail box is not set up. I was unable to leave a message will try again later. Debera Lat, RD 06/10/2019 2:26 PM.

## 2019-06-14 ENCOUNTER — Encounter: Payer: Self-pay | Admitting: Dietician

## 2019-06-14 ENCOUNTER — Ambulatory Visit (INDEPENDENT_AMBULATORY_CARE_PROVIDER_SITE_OTHER): Payer: Self-pay | Admitting: Podiatry

## 2019-06-14 ENCOUNTER — Ambulatory Visit: Payer: MEDICAID | Admitting: Podiatry

## 2019-06-14 ENCOUNTER — Other Ambulatory Visit: Payer: Self-pay

## 2019-06-14 DIAGNOSIS — E1149 Type 2 diabetes mellitus with other diabetic neurological complication: Secondary | ICD-10-CM

## 2019-06-14 DIAGNOSIS — L84 Corns and callosities: Secondary | ICD-10-CM

## 2019-06-14 DIAGNOSIS — M19071 Primary osteoarthritis, right ankle and foot: Secondary | ICD-10-CM

## 2019-06-14 NOTE — Telephone Encounter (Signed)
Unable to reach by phone. No follow up appointment with Dr. Isac Sarna has been scheduled as recommended. Will mail Ms. Hainline a letter asking her to schedule a 4 week follow up appointment.

## 2019-06-15 MED FILL — DULoxetine HCL 30 MG CPEP: 30 | 30 days supply | Qty: 30 | Fill #2

## 2019-06-15 NOTE — Progress Notes (Signed)
Subjective: Tina [name] presents to the office today today for follow-up evaluation of a wound to the right foot as well as her bilateral foot pain.  She states that she still gets swelling as well as a clicking sensation to her right ankle.  She wears the [place] brace that she has a lot of walking and that is helpful.  She has been putting medihoney in between the first and second toe on the right side and this is been helpful as well.  She still gets pain in between the toes as well as the left second toe that the callus.  Denies any fevers, chills, nausea, vomiting.  No calf pain, chest pain, shortness of breath.  Objective: AAO x3, NAD DP/PT pulses palpable bilaterally, CRT less than 3 seconds The wounds well-healed on her feet. Hyperkeratotic tissue at the sulcus of the first interspace of the right foot as well as the medial left second toe.  Upon debridement there is no ongoing ulceration, drainage or any signs of infection.  No open lesions.  Hyperkeratotic lesions also submetatarsal area left foot as well as lateral fifth met head.  Again no signs of infections.  Significant flatfoot deformities present with severe bunion deformity as well as hammertoe contracture.  There is previous right foot triple arthrodesis.  Some discomfort of the ankle joint.  No other areas of tenderness.  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  -I debrided the hyperkeratotic lesions x4 without any complications or bleeding. New offloading pads dispnesed.  -Continue offloading at all times.  Make sure she dries thoroughly in between her toes. -Continue [place] brace on the right side.  She presents today still wearing flip-flops again.  Discussed wearing more supportive shoes. Unfortunately without doing surgery to fix the bunion/hammertes I  believe the interdigital calluses are to remain.  Also she has arthritis of the ankle which we discussed which is likely causing the swelling, clicking.  RTC 4 weeks  [name] DPM

## 2019-06-21 MED FILL — NOVOLOG FLEXPEN SYRINGE: 100 | 20 days supply | Qty: 3 | Fill #2

## 2019-06-21 MED FILL — LANTUS SOLOSTAR 100 UNITS/M: 100 | 24 days supply | Qty: 6 | Fill #2

## 2019-06-21 MED FILL — traMADol HCL 50 MG TABS: 50 | 5 days supply | Qty: 15 | Fill #0

## 2019-06-27 NOTE — Therapy (Signed)
[place], [place], [address] Phone: [phone]   Fax:  [phone]  Physical Therapy Treatment/Discharge  Patient Details  Name: Tina [name] MRN: [id number] Date of Birth: Dec 16, 1970 Referring Provider (PT): Welford Roche   Encounter Date: 01/11/2019    Past Medical History:  Diagnosis Date  . Diabetes mellitus    type II  . Fibroids   . HTN (hypertension)   . Hyperlipidemia   . Iron deficiency anemia   . Left acetabular fracture ([place])   . Lumbar strain   . Obesity   . Ovarian cyst     Past Surgical History:  Procedure Laterality Date  . FOOT ARTHRODESIS, MODIFIED MCBRIDE     right foot bunion surgery and ankle surgery 1980s  . 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                              PT Short Term Goals - 01/11/19 1600      PT SHORT TERM GOAL #1   Title  Centralize bilat. LE pain to lumbar region for ADLs without leg pain    Baseline  symptoms into bilat. buttocks and thighs    Time  3    Period  Weeks    Status  New      PT SHORT TERM GOAL #2   Title  Tolerate sitting/standing for periods 10-15 min ea. with pain <6/10 for improved positional + ADLs tolerance    Baseline  pain 8/10, difficulty tolerating    Time  3    Period  Weeks    Status  New        PT Long Term Goals - 01/11/19 1602      PT LONG TERM GOAL #1   Title  Independent with HEP    Baseline  (updated today for lumbar region)    Time  6    Period  Weeks    Status  On-going    Target Date  02/22/19      PT LONG TERM GOAL #2   Title  Improve FOTO outcome measure score to 56% or less impairment    Baseline  for foot, not retested today    Time  6    Period  Weeks    Status  On-going    Target Date  02/22/19      PT LONG TERM GOAL #3   Title  Increase right ankle AROM at least 5-10  deg in all directions to improve gait mechanics    Baseline  not tested today due to re-eval for LBP    Time  6    Period  Weeks    Status  On-going    Target Date  02/22/19      PT LONG TERM GOAL #4   Title  Right ankle strength at least grossly 4/5 to improve stability for gait over uneven surfaces    Baseline  limited ability to assess MMTs due to limited ROM    Time  6    Period  Weeks    Status  On-going    Target Date  02/22/19      PT LONG TERM GOAL #5   Title  Community ambulation in ankle brace without need for [facility]    Baseline  uses SPC  Time  6    Period  Weeks    Status  On-going    Target Date  02/22/19      Additional Long Term Goals   Additional Long Term Goals  Yes      PT LONG TERM GOAL #6   Title  Increase trunk flexion AROM 20 deg or greater to improve ability to donn shoes    Baseline  30 deg    Time  6    Period  Weeks    Status  New    Target Date  02/22/19      PT LONG TERM GOAL #7   Title  Perform household chores, ADLs/IADLs as needed with LBP <5/10     Baseline  8/10    Time  6    Period  Weeks    Status  New    Target Date  02/22/19              Patient will benefit from skilled therapeutic intervention in order to improve the following deficits and impairments:  Obesity, Impaired flexibility, Difficulty walking, Decreased activity tolerance, Decreased endurance, Decreased range of motion, Decreased strength, Hypomobility, Pain, Impaired sensation, Abnormal gait  Visit Diagnosis: Pain in right ankle and joints of right foot - Plan: PT plan of care cert/re-cert  Chronic low back pain with sciatica, sciatica laterality unspecified, unspecified back pain laterality - Plan: PT plan of care cert/re-cert  Stiffness of right ankle, not elsewhere classified - Plan: PT plan of care cert/re-cert  Difficulty in walking, not elsewhere classified - Plan: PT plan of care cert/re-cert  Muscle weakness (generalized) - Plan: PT plan of care  cert/re-cert     Problem List Patient Active Problem List   Diagnosis Date Noted  . Conjunctivitis 04/14/2019  . Fibroids 04/14/2019  . Recurrent vaginal candidiasis 03/02/2019  . Financial difficulties 04/13/2018  . Seasonal allergies 02/06/2017  . Osteoarthritis of right ankle 08/29/2016  . Tobacco abuse 01/21/2016  . Severe obesity (BMI 35.0-39.9) 10/12/2015  . Foot ulcer ([place]) 09/13/2015  . Ovarian cyst 12/24/2012  . Right knee pain 12/09/2012  . Hypertension 07/20/2012  . Healthcare maintenance 06/27/2011  . Uncontrolled type 2 diabetes mellitus ([place]) 05/30/2010  . Dyslipidemia 05/30/2010  . 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    PHYSICAL THERAPY DISCHARGE SUMMARY  Visits from Start of Care: 3  Current functional level related to goals / functional outcomes: Status unknown-patient did not return for further therapy after last visit 01/11/19   Remaining deficits: NA   Education / Equipment: NA Plan: Patient agrees to discharge.  Patient goals were not met. Patient is being discharged due to not returning since the last visit.  ?????          Beaulah Dinning, PT, DPT 06/27/19 10:37 AM    [facility] [address] [place], [place], [address] Phone: [phone]   Fax:  [phone]  Name: SHUNTEL FISHBURN MRN: [id number] Date of Birth: 05-03-1971

## 2019-06-28 MED FILL — NOVOLOG FLEXPEN SYRINGE: 100 | 20 days supply | Qty: 3 | Fill #3

## 2019-07-12 MED FILL — NOVOLOG FLEXPEN SYRINGE: 100 | 20 days supply | Qty: 3 | Fill #3

## 2019-07-12 MED FILL — ENALAPRIL MALEATE 20 MG TAB: 20 | 30 days supply | Qty: 30 | Fill #1

## 2019-07-12 MED FILL — GABAPENTIN 800 MG TABLET: 800 | 30 days supply | Qty: 90 | Fill #1

## 2019-07-12 MED FILL — ATORVASTATIN 40 MG TABLET: 40 | 30 days supply | Qty: 30 | Fill #1

## 2019-07-12 MED FILL — DULoxetine HCL 30 MG CPEP: 30 | 30 days supply | Qty: 30 | Fill #3

## 2019-07-21 ENCOUNTER — Other Ambulatory Visit: Payer: Self-pay

## 2019-07-21 ENCOUNTER — Ambulatory Visit (INDEPENDENT_AMBULATORY_CARE_PROVIDER_SITE_OTHER): Payer: Medicaid Other | Admitting: Podiatry

## 2019-07-21 DIAGNOSIS — L84 Corns and callosities: Secondary | ICD-10-CM | POA: Diagnosis not present

## 2019-07-21 DIAGNOSIS — M19071 Primary osteoarthritis, right ankle and foot: Secondary | ICD-10-CM | POA: Diagnosis not present

## 2019-07-21 DIAGNOSIS — G629 Polyneuropathy, unspecified: Secondary | ICD-10-CM

## 2019-07-21 DIAGNOSIS — E1149 Type 2 diabetes mellitus with other diabetic neurological complication: Secondary | ICD-10-CM | POA: Diagnosis not present

## 2019-07-21 NOTE — Progress Notes (Signed)
Subjective: Tina [name] presents to the office today today for follow-up evaluation of a wound to the right foot as well as her bilateral foot pain.  She is calluses are painful again.  Denies any drainage or swelling to the area.  Left A1c was over 14.  Denies any fevers, chills, nausea, vomiting.  No calf pain, chest pain, shortness of breath.  Objective: AAO x3, NAD DP/PT pulses palpable bilaterally, CRT less than 3 seconds The wounds well-healed on her feet.  She has hyperkeratotic lesions medial second toe at the DIPJ, lateral fifth metatarsal head as well as plantar heel and on the right first interspace.  No ongoing ulceration drainage or signs of infection noted.  Hallux rigidus, bunion deformity on the right side.  Significant bunion deformity on the left side with hammertoe contracture.  History of triple arthrodesis right side.  No pain with calf compression, swelling, warmth, erythema.    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  -I debrided the hyperkeratotic lesions x4 without any complications or bleeding. New offloading pads dispnesed.  Continue offloading.  Ultimately discussed surgical intervention however A1c is uncontrolled.  Discussed this in the future.  Also continue the [place] brace for the right ankle. -Unfortunately given her chronic foot pain not able to wear shoes long-term and stand for long periods of time given discomfort and due to this she has not been able to return to work.  [name] DPM

## 2019-07-25 ENCOUNTER — Emergency Department (HOSPITAL_BASED_OUTPATIENT_CLINIC_OR_DEPARTMENT_OTHER)
Admission: EM | Admit: 2019-07-25 | Discharge: 2019-07-25 | Disposition: A | Discharge status: Home / Self Care | Payer: Medicaid Other | Attending: Emergency Medicine | Admitting: Emergency Medicine

## 2019-07-25 ENCOUNTER — Encounter (HOSPITAL_BASED_OUTPATIENT_CLINIC_OR_DEPARTMENT_OTHER): Payer: Self-pay | Admitting: Emergency Medicine

## 2019-07-25 ENCOUNTER — Emergency Department (HOSPITAL_BASED_OUTPATIENT_CLINIC_OR_DEPARTMENT_OTHER): Payer: Medicaid Other

## 2019-07-25 ENCOUNTER — Telehealth: Payer: Self-pay | Admitting: Pharmacist

## 2019-07-25 ENCOUNTER — Other Ambulatory Visit: Payer: Self-pay

## 2019-07-25 DIAGNOSIS — E785 Hyperlipidemia, unspecified: Secondary | ICD-10-CM | POA: Diagnosis not present

## 2019-07-25 DIAGNOSIS — Z79899 Other long term (current) drug therapy: Secondary | ICD-10-CM | POA: Insufficient documentation

## 2019-07-25 DIAGNOSIS — M25552 Pain in left hip: Secondary | ICD-10-CM | POA: Insufficient documentation

## 2019-07-25 DIAGNOSIS — I10 Essential (primary) hypertension: Secondary | ICD-10-CM | POA: Insufficient documentation

## 2019-07-25 DIAGNOSIS — F17210 Nicotine dependence, cigarettes, uncomplicated: Secondary | ICD-10-CM | POA: Insufficient documentation

## 2019-07-25 DIAGNOSIS — E119 Type 2 diabetes mellitus without complications: Secondary | ICD-10-CM | POA: Diagnosis not present

## 2019-07-25 MED ORDER — PREDNISONE 10 MG PO TABS: 40.0000 mg | ORAL_TABLET | Freq: Every day | ORAL | 0 refills | Status: DC

## 2019-07-25 MED ORDER — HYDROCODONE-ACETAMINOPHEN 5-325 MG PO TABS
1.0000 | ORAL_TABLET | Freq: Once | ORAL | Status: AC
  Administered 2019-07-25: 1 via ORAL
  Filled 2019-07-25: qty 1

## 2019-07-25 MED ORDER — HYDROCODONE-ACETAMINOPHEN 5-325 MG PO TABS: 1.0000 | ORAL_TABLET | Freq: Four times a day (QID) | ORAL | 0 refills | Status: DC | PRN

## 2019-07-25 MED FILL — predniSONE 10 MG TABS: 10 | 5 days supply | Qty: 20 | Fill #0

## 2019-07-25 MED FILL — HYDROCODON-APAP 5-325: 5-325 | 3 days supply | Qty: 14 | Fill #0

## 2019-07-25 MED FILL — NOVOLOG FLEXPEN SYRINGE: 100 | 20 days supply | Qty: 3 | Fill #4

## 2019-07-25 MED FILL — LANTUS SOLOSTAR 100 UNITS/M: 100 | 24 days supply | Qty: 6 | Fill #3

## 2019-07-25 NOTE — ED Triage Notes (Signed)
Pain from left hip, down posterior upper leg to back of left knee x4 days.  No known injury.

## 2019-07-25 NOTE — Discharge Instructions (Signed)
Take the prednisone as directed.  Take the hydrocodone as needed for pain.  Make an appointment to follow-up with sports medicine upstairs.  As we discussed symptoms could be coming from the left hip or possibly could be left-sided sciatica.

## 2019-07-25 NOTE — ED Provider Notes (Signed)
[place] EMERGENCY DEPARTMENT Provider Note   CSN: [id number] Arrival date & time: 07/25/19  1024     History   Chief Complaint Chief Complaint  Patient presents with  . Leg Pain    HPI Tina [name] is a 48 y.o. female.     Patient with complaint of left lateral hip pain for about 4 days.  Radiates down towards the knee area but does not go below the knee.  No numbness or weakness to the left foot.  Patient had at age 1 surgery on that left hip.  Has not had any significant problems since.  Patient has had right-sided sciatica recently.  Patient states there is really no back pain.  No fall or injury.  Patient is currently followed by internal medicine outpatient clinics at [facility].  Currently does not have an orthopedic doctor.     Past Medical History:  Diagnosis Date  . Diabetes mellitus    type II  . Fibroids   . HTN (hypertension)   . Hyperlipidemia   . Iron deficiency anemia   . Left acetabular fracture ([place])   . Lumbar strain   . Obesity   . Ovarian cyst     Patient Active Problem List   Diagnosis Date Noted  . Conjunctivitis 04/14/2019  . Fibroids 04/14/2019  . Recurrent vaginal candidiasis 03/02/2019  . Financial difficulties 04/13/2018  . Seasonal allergies 02/06/2017  . Osteoarthritis of right ankle 08/29/2016  . Tobacco abuse 01/21/2016  . Severe obesity (BMI 35.0-39.9) 10/12/2015  . Foot ulcer ([place]) 09/13/2015  . Ovarian cyst 12/24/2012  . Right knee pain 12/09/2012  . Hypertension 07/20/2012  . Healthcare maintenance 06/27/2011  . Uncontrolled type 2 diabetes mellitus ([place]) 05/30/2010  . Dyslipidemia 05/30/2010  . 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    Past Surgical History:  Procedure Laterality Date  . FOOT ARTHRODESIS, MODIFIED MCBRIDE     right foot bunion surgery and ankle surgery 1980s  . 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 Authorizing Provider  albuterol (PROVENTIL HFA;VENTOLIN HFA) 108 (90 Base) MCG/ACT inhaler Inhale 1-2 puffs into the lungs every 6 (six) hours as needed for wheezing or shortness of breath. 10/10/18   Petrucelli, [name], PA-C  atorvastatin (LIPITOR) 40 MG tablet TAKE 1 TABLET BY MOUTH DAILY. 05/19/19   Santos-Sanchez, Merlene Morse, MD  Blood Glucose Monitoring Suppl (CONTOUR NEXT ONE) KIT 1 each by Does not apply route See admin instructions. USE TO CHECK BLOOD GLUCOSE ONCE DAILY. IM PROGRAM. 01/25/18   [name], MD  diclofenac sodium (VOLTAREN) 1 % GEL Apply 2 g topically 4 (four) times daily. 08/29/16   Asencion Partridge, MD  DULoxetine (CYMBALTA) 30 MG capsule  07/12/19   [provider]  DULoxetine (CYMBALTA) 60 MG capsule Take 1 capsule (60 mg total) by mouth daily. 05/16/19   Posey Pronto,  Donika K, DO  enalapril (VASOTEC) 20 MG tablet TAKE 1 TABLET (20 MG TOTAL) BY MOUTH DAILY. 05/19/19   Santos-Sanchez, Merlene Morse, MD  gabapentin (NEURONTIN) 800 MG tablet Take 1 tablet (800 mg total) by mouth 3 (three) times daily. 01/07/19   Santos-Sanchez, Merlene Morse, MD  glucose blood (CONTOUR NEXT TEST) test strip Please use to check your sugar 3 times a day. 05/09/19   Welford Roche, MD  HYDROcodone-acetaminophen (NORCO/VICODIN) 5-325 MG tablet Take 1 tablet by mouth every 6 (six) hours as needed for moderate pain. 07/25/19   Fredia Sorrow, MD  hydrOXYzine (ATARAX/VISTARIL) 25 MG tablet Take 25 mg by mouth 3 (three) times daily as needed.    [provider]  insulin aspart (NOVOLOG) 100 UNIT/ML FlexPen Inject 5 Units into the skin 3 (three) times daily with meals. Patient taking differently: Inject 6 Units into the skin 3 (three) times daily with meals.   05/09/19   Santos-Sanchez, Merlene Morse, MD  Insulin Glargine (LANTUS SOLOSTAR) 100 UNIT/ML Solostar Pen Inject 25 Units into the skin daily at 10 pm. 05/09/19   Welford Roche, MD  Insulin Pen Needle (UNIFINE PENTIPS) 31G X 5 MM MISC Inject 1 Units into the skin 3 (three) times daily. Use to inject insulin 4 times daily. IM PROGRAM 05/11/19   Welford Roche, MD  lamoTRIgine (LAMICTAL) 100 MG tablet Take 100 mg by mouth 2 (two) times daily.     [provider]  Lancets MISC 1 Units by Does not apply route 3 (three) times daily. 05/09/19   Welford Roche, MD  Multiple Vitamins-Minerals (MULTIVITAMIN WITH MINERALS) tablet Take 1 tablet by mouth daily.      [provider]  predniSONE (DELTASONE) 10 MG tablet Take 4 tablets (40 mg total) by mouth daily. 07/25/19   Fredia Sorrow, MD  sertraline (ZOLOFT) 100 MG tablet Take 100 mg by mouth daily.    [provider]  sitaGLIPtin-metformin (JANUMET) 50-1000 MG tablet Take 1 tablet by mouth 2 (two) times daily with a meal. IM program. 09/09/18   Welford Roche, MD  traMADol Veatrice Bourbon) 50 MG tablet  06/21/19   [provider]  traZODone (DESYREL) 50 MG tablet Take 50 mg by mouth at bedtime.    [provider]    Family History Family History  Problem Relation Age of Onset  . Colon cancer Sister   . Diabetes Mother   . Diabetes Father   . Diabetes Brother   . Celiac disease Paternal Aunt     Social History Social History   Tobacco Use  . Smoking status: Current Every Day Smoker    Packs/day: 0.50    Years: 23.00    Pack years: 11.50    Types: Cigarettes  . Smokeless tobacco: Never Used  . Tobacco comment: 1/2PPD  Substance Use Topics  . Alcohol use: Yes    Alcohol/week: 0.0 standard drinks    Comment: occ  . Drug use: No     Allergies   Codeine   Review of Systems Review of Systems  Constitutional: Negative for chills and fever.  HENT: Negative for congestion,  rhinorrhea and sore throat.   Eyes: Negative for visual disturbance.  Respiratory: Negative for cough and shortness of breath.   Cardiovascular: Negative for chest pain and leg swelling.  Gastrointestinal: Negative for abdominal pain, diarrhea, nausea and vomiting.  Genitourinary: Negative for dysuria.  Musculoskeletal: Negative for back pain and neck pain.  Skin: Negative for rash.  Neurological: Negative for dizziness, light-headedness and headaches.  Hematological:  Does not bruise/bleed easily.  Psychiatric/Behavioral: Negative for confusion.     Physical Exam Updated Vital Signs BP (!) 118/54 (BP Location: Right Arm)   Pulse 80   Temp 98.4 F (36.9 C) (Oral)   Resp 16   Ht 1.93 m (6' 4")   Wt 126.7 kg   SpO2 100%   BMI 34.00 kg/m   Physical Exam Vitals signs and nursing note reviewed.  Constitutional:      General: She is not in acute distress.    Appearance: Normal appearance. She is well-developed.  HENT:     Head: Normocephalic and atraumatic.  Eyes:     Conjunctiva/sclera: Conjunctivae normal.  Neck:     Musculoskeletal: Normal range of motion and neck supple.  Cardiovascular:     Rate and Rhythm: Normal rate and regular rhythm.     Heart sounds: No murmur.  Pulmonary:     Effort: Pulmonary effort is normal. No respiratory distress.     Breath sounds: Normal breath sounds.  Abdominal:     Palpations: Abdomen is soft.     Tenderness: There is no abdominal tenderness.  Musculoskeletal: Normal range of motion.        General: No swelling, tenderness or deformity.     Comments: Sensation to the left foot is intact.  Good strength.  Good cap refill.  Skin:    General: Skin is warm and dry.     Capillary Refill: Capillary refill takes less than 2 seconds.  Neurological:     General: No focal deficit present.     Mental Status: She is alert and oriented to person, place, and time.     Cranial Nerves: No cranial nerve deficit.     Sensory: No sensory deficit.      Motor: No weakness.      ED Treatments / Results  Labs (all labs ordered are listed, but only abnormal results are displayed) Labs Reviewed - No data to display  EKG None  Radiology Dg Hips Bilat W Or Wo Pelvis 3-4 Views  Result Date: 07/25/2019 CLINICAL DATA:  Left hip pain. EXAM: DG HIP (WITH OR WITHOUT PELVIS) 3-4V BILAT COMPARISON:  No recent. 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 Surgical screws left hip. Hardware intact. Normal bony alignment. No acute bony abnormality identified. Pelvic calcifications consistent phleboliths. 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 Surgical screws left hip. No acute abnormality. Electronically Signed   By: Marcello Moores  Register   On: 07/25/2019 12:27    Procedures Procedures (including critical care time)  Medications Ordered in ED Medications  HYDROcodone-acetaminophen (NORCO/VICODIN) 5-325 MG per tablet 1 tablet (has no administration in time range)     Initial Impression / Assessment and Plan / ED Course  I have reviewed the triage vital signs and the nursing notes.  Pertinent labs & imaging results that were available during my care of the patient were reviewed by me and considered in my medical decision making (see chart for details).        Symptoms seem to be more consistent with left hip pain.  X-rays of both hips shows degenerative changes in the lumbar area as well has both hips.  The previous pinning is present.  No acute abnormalities.  Will treat with prednisone and pain medication have her follow-up with sports medicine.  It is possible that it could be a lumbar radicular type pain.  But seems to be more consistent with left hip.  Final Clinical Impressions(s) / ED  Diagnoses   Final diagnoses:  Pain of left hip joint    ED Discharge Orders         Ordered    predniSONE (DELTASONE) 10 MG tablet  Daily     07/25/19 1350    HYDROcodone-acetaminophen (NORCO/VICODIN) 5-325 MG  tablet  Every 6 hours PRN     07/25/19 1350           Fredia Sorrow, MD 07/25/19 1355

## 2019-08-01 ENCOUNTER — Ambulatory Visit: Payer: Self-pay

## 2019-08-01 ENCOUNTER — Other Ambulatory Visit: Payer: Self-pay

## 2019-08-01 ENCOUNTER — Ambulatory Visit (INDEPENDENT_AMBULATORY_CARE_PROVIDER_SITE_OTHER): Payer: Medicaid Other | Admitting: Internal Medicine

## 2019-08-01 ENCOUNTER — Encounter: Payer: Self-pay | Admitting: Internal Medicine

## 2019-08-01 VITALS — BP 144/73 | HR 102 | Temp 98.0°F | Ht 76.0 in | Wt 285.9 lb

## 2019-08-01 DIAGNOSIS — D219 Benign neoplasm of connective and other soft tissue, unspecified: Secondary | ICD-10-CM

## 2019-08-01 DIAGNOSIS — Z79899 Other long term (current) drug therapy: Secondary | ICD-10-CM | POA: Diagnosis not present

## 2019-08-01 DIAGNOSIS — Z8742 Personal history of other diseases of the female genital tract: Secondary | ICD-10-CM

## 2019-08-01 DIAGNOSIS — Z793 Long term (current) use of hormonal contraceptives: Secondary | ICD-10-CM | POA: Diagnosis not present

## 2019-08-01 DIAGNOSIS — M5116 Intervertebral disc disorders with radiculopathy, lumbar region: Secondary | ICD-10-CM

## 2019-08-01 DIAGNOSIS — G8929 Other chronic pain: Secondary | ICD-10-CM

## 2019-08-01 LAB — POCT URINE PREGNANCY: Preg Test, Ur: NEGATIVE

## 2019-08-01 MED ORDER — KETOROLAC TROMETHAMINE 60 MG/2ML IM SOLN
60.0000 mg | Freq: Once | INTRAMUSCULAR | Status: AC
  Administered 2019-08-01: 60 mg via INTRAMUSCULAR

## 2019-08-01 MED ORDER — MEDROXYPROGESTERONE ACETATE 150 MG/ML IM SUSP
150.0000 mg | Freq: Once | INTRAMUSCULAR | Status: AC
  Administered 2019-08-01: 150 mg via INTRAMUSCULAR

## 2019-08-01 NOTE — Patient Instructions (Addendum)
Tina [name],  I am sorry you are having pain today. Hopefully the shot of Toradol today will help reduce the inflammation in your low back that is causing the sciatic or nerve pain down your back and leg. Continue taking your gabapentin and Cymbalta at home as these will also help.  Your pain should continue to improve over the next 2-3 weeks. Please go to the emergency room if you develop back pain with numbness or weakness in your legs, numbness/tingling around your buttocks, fevers/chills, or loss of control of your bowels or bladder.    Radicular Pain Radicular pain is a type of pain that spreads from your back or neck along a spinal nerve. Spinal nerves are nerves that leave the spinal cord and go to the muscles. Radicular pain is sometimes called radiculopathy, radiculitis, or a pinched nerve. When you have this type of pain, you may also have weakness, numbness, or tingling in the area of your body that is supplied by the nerve. The pain may feel sharp and burning. Depending on which spinal nerve is affected, the pain may occur in the:  Neck area (cervical radicular pain). You may also feel pain, numbness, weakness, or tingling in the arms.  Mid-spine area (thoracic radicular pain). You would feel this pain in the back and chest. This type is rare.  Lower back area (lumbar radicular pain). You would feel this pain as low back pain. You may feel pain, numbness, weakness, or tingling in the buttocks or legs. Sciatica is a type of lumbar radicular pain that shoots down the back of the leg. Radicular pain occurs when one of the spinal nerves becomes irritated or squeezed (compressed). It is often caused by something pushing on a spinal nerve, such as one of the bones of the spine (vertebrae) or one of the round cushions between vertebrae (intervertebral disks). This can result from:  An injury.  Wear and tear or aging of a disk.  The growth of a bone spur that pushes on the nerve. Radicular  pain often goes away when you follow instructions from your health care provider for relieving pain at home. Follow these instructions at home: Managing pain      If directed, put ice on the affected area: ? Put ice in a plastic bag. ? Place a towel between your skin and the bag. ? Leave the ice on for 20 minutes, 2-3 times a day.  If directed, apply heat to the affected area as often as told by your health care provider. Use the heat source that your health care provider recommends, such as a moist heat pack or a heating pad. ? Place a towel between your skin and the heat source. ? Leave the heat on for 20-30 minutes. ? Remove the heat if your skin turns bright red. This is especially important if you are unable to feel pain, heat, or cold. You may have a greater risk of getting burned. Activity   Do not sit or rest in bed for long periods of time.  Try to stay as active as possible. Ask your health care provider what type of exercise or activity is best for you.  Avoid activities that make your pain worse, such as bending and lifting.  Do not lift anything that is heavier than 10 lb (4.5 kg), or the limit that you are told, until your health care provider says that it is safe.  Practice using proper technique when lifting items. Proper lifting technique involves bending  your knees and rising up.  Do strength and range-of-motion exercises only as told by your health care provider or physical therapist. General instructions  Take over-the-counter and prescription medicines only as told by your health care provider.  Pay attention to any changes in your symptoms.  Keep all follow-up visits as told by your health care provider. This is important. ? Your health care provider may send you to a physical therapist to help with this pain. Contact a health care provider if:  Your pain and other symptoms get worse.  Your pain medicine is not helping.  Your pain has not improved  after a few weeks of home care.  You have a fever. Get help right away if:  You have severe pain, weakness, or numbness.  You have difficulty with bladder or bowel control. Summary  Radicular pain is a type of pain that spreads from your back or neck along a spinal nerve.  When you have radicular pain, you may also have weakness, numbness, or tingling in the area of your body that is supplied by the nerve.  The pain may feel sharp or burning.  Radicular pain may be treated with ice, heat, medicines, or physical therapy. This information is not intended to replace advice given to you by your health care provider. Make sure you discuss any questions you have with your health care provider. Document Released: 11/27/2004 Document Revised: 05/04/2018 Document Reviewed: 05/04/2018 Elsevier Patient Education  2020 [employer].

## 2019-08-02 ENCOUNTER — Other Ambulatory Visit: Payer: Self-pay | Admitting: Internal Medicine

## 2019-08-02 ENCOUNTER — Other Ambulatory Visit: Payer: Self-pay | Admitting: Neurology

## 2019-08-02 DIAGNOSIS — E119 Type 2 diabetes mellitus without complications: Secondary | ICD-10-CM

## 2019-08-02 NOTE — Progress Notes (Signed)
   CC: L sided back pain  HPI:  Tina [name] is a 48 y.o. F with significant PMH as outlined below. Please see problem based charting for additional information.  Past Medical History:  Diagnosis Date  . Diabetes mellitus    type II  . Fibroids   . HTN (hypertension)   . Hyperlipidemia   . Iron deficiency anemia   . Left acetabular fracture ([place])   . Lumbar strain   . Obesity   . Ovarian cyst    Review of Systems:  Review of Systems  Constitutional: Negative for chills and fever.  Gastrointestinal: Negative for abdominal pain, constipation, diarrhea, nausea and vomiting.  Genitourinary: Negative for frequency and urgency.       No bowel or bladder incontinence  Musculoskeletal: Positive for back pain and joint pain. Negative for myalgias and neck pain.  Neurological: Negative for dizziness, tingling, sensory change, weakness and headaches.   Physical Exam:  Vitals:   08/01/19 1416  BP: (!) 144/73  Pulse: (!) 102  Temp: 98 F (36.7 C)  TempSrc: Oral  SpO2: 100%  Weight: 285 lb 14.4 oz (129.7 kg)  Height: 6\' 4"  (1.93 m)   Physical Exam Vitals signs and nursing note reviewed.  Constitutional:      General: She is not in acute distress.    Appearance: She is not ill-appearing.  Cardiovascular:     Rate and Rhythm: Regular rhythm. Tachycardia present.     Heart sounds: Normal heart sounds.  Pulmonary:     Effort: Pulmonary effort is normal. No respiratory distress.     Breath sounds: Normal breath sounds.  Abdominal:     General: Bowel sounds are normal.     Palpations: Abdomen is soft.     Tenderness: There is no abdominal tenderness.  Musculoskeletal:     Comments: Tenderness to palpation over lumber spine and L paraspinal muscles. No deformities or injuries.   Neurological:     Mental Status: She is alert.  Psychiatric:        Mood and Affect: Affect is labile and tearful.    Assessment & Plan:   See Encounters Tab for problem based  charting.  Patient seen with Dr. Lynnae January

## 2019-08-03 ENCOUNTER — Telehealth: Payer: Self-pay | Admitting: Internal Medicine

## 2019-08-03 NOTE — Telephone Encounter (Signed)
appt [facility] 10/1 at 1315

## 2019-08-03 NOTE — Telephone Encounter (Signed)
Pt seen on 08/01/2019 by [facility] Dr. Ronnald Ramp calling to report the pain in her legs are worse than on Monday.  Patient states she has not been up on her legs and they are severely hurting and the shot did not help.  Pt is requesting a call back.

## 2019-08-03 NOTE — Telephone Encounter (Signed)
If the pain is worsening, she should be reevaluated. Can she come back in?

## 2019-08-04 ENCOUNTER — Ambulatory Visit (INDEPENDENT_AMBULATORY_CARE_PROVIDER_SITE_OTHER): Payer: Medicaid Other | Admitting: Internal Medicine

## 2019-08-04 ENCOUNTER — Other Ambulatory Visit: Payer: Self-pay

## 2019-08-04 VITALS — BP 144/73 | HR 89 | Temp 98.6°F | Ht 76.0 in | Wt 282.2 lb

## 2019-08-04 DIAGNOSIS — G629 Polyneuropathy, unspecified: Secondary | ICD-10-CM

## 2019-08-04 DIAGNOSIS — M5116 Intervertebral disc disorders with radiculopathy, lumbar region: Secondary | ICD-10-CM | POA: Diagnosis present

## 2019-08-04 DIAGNOSIS — G8929 Other chronic pain: Secondary | ICD-10-CM

## 2019-08-04 DIAGNOSIS — Z79899 Other long term (current) drug therapy: Secondary | ICD-10-CM | POA: Diagnosis not present

## 2019-08-04 MED ORDER — MELOXICAM 15 MG PO TABS: 15.0000 mg | ORAL_TABLET | Freq: Every day | ORAL | 0 refills | Status: DC

## 2019-08-04 MED ORDER — CYCLOBENZAPRINE HCL 7.5 MG PO TABS: 7.5000 mg | ORAL_TABLET | Freq: Three times a day (TID) | ORAL | 0 refills | Status: DC | PRN

## 2019-08-04 MED FILL — MELOXICAM 15 MG TABLET: 15 | 21 days supply | Qty: 21 | Fill #0

## 2019-08-04 MED FILL — CYCLOBENZAPRINE 10 MG TAB: 10 | 30 days supply | Qty: 90 | Fill #0

## 2019-08-04 MED FILL — JANUMET 50-1,000 MG TABLET: 50-1000 | 30 days supply | Qty: 60 | Fill #0

## 2019-08-04 NOTE — Assessment & Plan Note (Signed)
Pt with history of chronic lower back pain presents today for L sided back pain radiating down the hip and leg. She went to the ED for this one week ago on 9/21. X-rays negative for an acute abnormality, showed degenerative changes of the lumbar spine and both hips, and in place surgical screws of left hip. Pt's pain at that time was felt to be arising for L hip osteoarthritis and she was given a 5 day course of Norco and prednisone. Her pain is unchanged today. Rated at 10/10, burning, aching, sharp, and shooting. Present constantly without relapsing or remitting factors. No saddle anesthesia, bowel/bladder incontinence, or fevers at home. She has a history of R-side sciatica and neuropathic R foot pain for which she takes gabapentin and cymbalta. States that she had previous toradol shots which improved her back pain and is interesting in trying this again today. On exam, pt has tenderness to palpation of lumbar spine and paraspinal muscles. ROM in tact without deformities.   Assessment - L sided back and leg pain concerning for chronic radiculopathy vs osteoarthritis  Plan - given toradol 60mg  IM today in office - continue gabapentin 800mg  TID and cymbalta 60mg  daily  - pt already referred to PT

## 2019-08-04 NOTE — Assessment & Plan Note (Addendum)
Patient present with persistent pain consistent with her pain from her visit on 9/28. Pain continues to be from her low back and radiating into her left hip, anterior thigh, and down to her left ankle. She states the pain is worse in her anterior thigh. She also reports some new medial Left calf numbness, but no weakness. Denies fever, saddle anesthesia, incontinence. Has no history of malignancy. Left Thigh is tender to palpation. Left hip and back are also tender, but less so than thigh.   Patient has little to no relieve after ecent ED visit where she received Steroids and Norco. She also did not have relief after recent toradol injection here. She is on appropriate meications (Gabapentin and Cybalta) for neuropathic pain as she has chronic RLE neuropathy.   She is most likely having a flare of her chronic pain. She had imaging done in the ED which were negative for acute process and she continues to be negative for red flag symtpoms. We will treat supportively with a course of NSAIDs and muscle relaxants to help manage her symptoms and monitor for improvement over the ned 2-4 weeks. - Flexeril 10mg  TID PRN - Mobic 15mg  BID for two weeks, then PRN - If pain remains severe, tele health visit for consideration of short course of opioid pain medication next week - Follow up in 2-4 weeks, with PCP if available

## 2019-08-04 NOTE — Progress Notes (Signed)
    CC: Back pain  HPI:  [name] is a 48 y.o. F with PMHx listed below presenting for Low back pain. Please see the A&P for the status of the patient's chronic medical problems.  Past Medical History:  Diagnosis Date  . Diabetes mellitus    type II  . Fibroids   . HTN (hypertension)   . Hyperlipidemia   . Iron deficiency anemia   . Left acetabular fracture ([place])   . Lumbar strain   . Obesity   . Ovarian cyst    Review of Systems: Performed and all others negative.  Physical Exam:  Vitals:   08/04/19 1323  BP: (!) 144/73  Pulse: 89  Temp: 98.6 F (37 C)  TempSrc: Oral  SpO2: 100%  Weight: 282 lb 3.2 oz (128 kg)  Height: 6\' 4"  (1.93 m)   Physical Exam Constitutional:      General: She is not in acute distress.    Appearance: Normal appearance.  Cardiovascular:     Rate and Rhythm: Normal rate and regular rhythm.     Pulses: Normal pulses.     Heart sounds: Normal heart sounds.  Pulmonary:     Effort: Pulmonary effort is normal. No respiratory distress.     Breath sounds: Normal breath sounds.  Abdominal:     General: Bowel sounds are normal. There is no distension.     Palpations: Abdomen is soft.     Tenderness: There is no abdominal tenderness.  Musculoskeletal:        General: Swelling and deformity present.     Comments: Mildly tender to palpation at lumbar spine and left hip Moderate-Severe tender to palpation of Left anterior thigh Pulses present No tightness of muscle compartment noted  Skin:    General: Skin is warm and dry.  Neurological:     Comments: Strength grossly intact bilat lower extremities. Left Knee extension and Hip flexion somewhat limited by pain Numbness of Left medial calf     Assessment & Plan:   See Encounters Tab for problem based charting.  Patient discussed with Dr. Evette Doffing

## 2019-08-04 NOTE — Patient Instructions (Signed)
Thank you for allowing [place] to care for you  For your acute on chronic pain - We will add a course of muscle relaxer PRN for 4 weeks - We have ordered daily anti-inflammatory, Meloxicam 15mg  Daily for 2 weeks - Follow up with neurology as scheduled  We will hold off on extensive workup as flares typically resolve with supportive care over the course of 4 weeks and given the cost of such a work up  Follow up in about 2-3 weeks, follow up with PCP if available

## 2019-08-04 NOTE — Assessment & Plan Note (Signed)
Pt with history of fibroids and gets Depo-Provera injections. Last injection 6/11. Pt requesting one today. She is out of the 3 month window and 2 weeks grace period so will obtain a pregnancy test today.  Plan - pregnancy test negative - given Depo-Provera 150mg  IM today - due for next injection on 12/28

## 2019-08-06 NOTE — Progress Notes (Signed)
Internal Medicine Clinic Attending  I saw and evaluated the patient.  I personally confirmed the key portions of the history and exam documented by Dr. Jones and I reviewed pertinent patient test results.  The assessment, diagnosis, and plan were formulated together and I agree with the documentation in the resident's note.     

## 2019-08-07 NOTE — Progress Notes (Signed)
Internal Medicine Clinic Attending  Case discussed with Dr. Melvin  at the time of the visit.  We reviewed the resident's history and exam and pertinent patient test results.  I agree with the assessment, diagnosis, and plan of care documented in the resident's note.  

## 2019-08-07 NOTE — Addendum Note (Signed)
Addended by: Lalla Brothers T on: 08/07/2019 01:33 PM   Modules accepted: Level of Service

## 2019-08-09 ENCOUNTER — Encounter: Payer: Self-pay | Admitting: Internal Medicine

## 2019-08-09 ENCOUNTER — Ambulatory Visit (INDEPENDENT_AMBULATORY_CARE_PROVIDER_SITE_OTHER): Payer: Medicaid Other | Admitting: Internal Medicine

## 2019-08-09 ENCOUNTER — Other Ambulatory Visit: Payer: Self-pay

## 2019-08-09 DIAGNOSIS — M5116 Intervertebral disc disorders with radiculopathy, lumbar region: Secondary | ICD-10-CM | POA: Diagnosis not present

## 2019-08-09 DIAGNOSIS — Z791 Long term (current) use of non-steroidal anti-inflammatories (NSAID): Secondary | ICD-10-CM | POA: Diagnosis not present

## 2019-08-09 DIAGNOSIS — Z79899 Other long term (current) drug therapy: Secondary | ICD-10-CM | POA: Diagnosis not present

## 2019-08-09 DIAGNOSIS — Z79891 Long term (current) use of opiate analgesic: Secondary | ICD-10-CM | POA: Diagnosis not present

## 2019-08-09 MED ORDER — OXYCODONE-ACETAMINOPHEN 7.5-325 MG PO TABS: 1.0000 | ORAL_TABLET | Freq: Four times a day (QID) | ORAL | 0 refills | Status: DC | PRN

## 2019-08-09 NOTE — Assessment & Plan Note (Addendum)
Patient has persistent LLE pain as described in previous note on 08/04/2019. She continues to deny fever, chills, saddle anesthesia or incontinence. She states her pain has not improved with addition of meloxicam and cyclobenzaprine. She states she may have some swelling in her left calf but does not think it is different from her previous visit (and certainly not significantly so). Will give patient a trial of oxycodone; if pain fails to improve by next week, we will pursue repeat MRI to evaluate for progression of her lumbar disc disease considering her numbness and function-limiting pain. - Continue chronic gabapentin and duloxetine - Continue Mobic and flexeril - Oxycodone 7.5mg  q6h PRN for 5 days, #20 - Follow up early next week to evaluate response  ADDENDUM: Patient called stating pain medicine was too expensive at her pharmacy. This was transferred to [facility] due to decrease cost with discount from [employer] Database reviewed and appropriate. I contacted her primary pharmacy and cancelled other prescription for percocet that was sent there.

## 2019-08-09 NOTE — Patient Instructions (Signed)
Verbal instruction given for tele-health visit  Patient is to call back early next week to re-evaluate pain and her response to additional medication

## 2019-08-09 NOTE — Progress Notes (Signed)
  This is a telephone encounter between [employer] and Tina [name] on 08/09/2019. The visit was conducted with the patient located at home and Tina [name] at [facility]. The patient's identity was confirmed using their DOB and current address. The patient has consented to being evaluated through a telephone encounter and understands the associated risks (an examination cannot be done and the patient may need to come in for an appointment) / benefits (allows the patient to remain at home, decreasing exposure to coronavirus). I personally spent 9 minutes on medical discussion.  CC: Lumbar disc herniation with radiculopathy  HPI:  [name] is a 48 y.o. F with PMHx listed below presenting for Lumbar disc herniation with radiculopathy. Please see the A&P for the status of the patient's chronic medical problems.  Past Medical History:  Diagnosis Date  . Diabetes mellitus    type II  . Fibroids   . HTN (hypertension)   . Hyperlipidemia   . Iron deficiency anemia   . Left acetabular fracture ([place])   . Lumbar strain   . Obesity   . Ovarian cyst    Review of Systems:  Performed and all others negative.  Physical Exam:  There were no vitals filed for this visit. Not performed for tel-health visit  Assessment & Plan:   See Encounters Tab for problem based charting.   Patient discussed with Dr. Rebeca Alert

## 2019-08-10 ENCOUNTER — Telehealth: Payer: Self-pay | Admitting: *Deleted

## 2019-08-10 MED ORDER — OXYCODONE-ACETAMINOPHEN 7.5-325 MG PO TABS: 1.0000 | ORAL_TABLET | Freq: Four times a day (QID) | ORAL | 0 refills | Status: AC | PRN

## 2019-08-10 NOTE — Telephone Encounter (Signed)
Contacted patient by phone and her prescription was transferred to [facility] where she can use the goodrx card or app for a discount.

## 2019-08-10 NOTE — Addendum Note (Signed)
Addended by: [name] B on: 08/10/2019 03:14 PM   Modules accepted: Orders

## 2019-08-10 NOTE — Telephone Encounter (Signed)
Call from pt - stated her leg is still hurting. I asked about Oxycodone - stated too expensive; cost $67.00; she does not have insurance. Please advise. Thanks

## 2019-08-10 NOTE — Addendum Note (Signed)
Addended by: [name] B on: 08/10/2019 03:08 PM   Modules accepted: Orders

## 2019-08-12 NOTE — Telephone Encounter (Signed)
Unable to reach.

## 2019-08-15 ENCOUNTER — Ambulatory Visit (INDEPENDENT_AMBULATORY_CARE_PROVIDER_SITE_OTHER): Payer: Medicaid Other | Admitting: Neurology

## 2019-08-15 ENCOUNTER — Encounter: Payer: Self-pay | Admitting: Neurology

## 2019-08-15 ENCOUNTER — Other Ambulatory Visit: Payer: Self-pay

## 2019-08-15 VITALS — BP 140/90 | HR 102 | Ht 76.0 in | Wt 281.0 lb

## 2019-08-15 DIAGNOSIS — M5417 Radiculopathy, lumbosacral region: Secondary | ICD-10-CM | POA: Diagnosis not present

## 2019-08-15 DIAGNOSIS — E519 Thiamine deficiency, unspecified: Secondary | ICD-10-CM | POA: Diagnosis not present

## 2019-08-15 DIAGNOSIS — E1142 Type 2 diabetes mellitus with diabetic polyneuropathy: Secondary | ICD-10-CM | POA: Diagnosis not present

## 2019-08-15 MED FILL — ENALAPRIL MALEATE 20 MG TAB: 20 | 30 days supply | Qty: 30 | Fill #2

## 2019-08-15 MED FILL — LANTUS SOLOSTAR 100 UNITS/M: 100 | 24 days supply | Qty: 6 | Fill #4

## 2019-08-15 MED FILL — NOVOLOG FLEXPEN SYRINGE: 100 | 20 days supply | Qty: 3 | Fill #5

## 2019-08-15 NOTE — Progress Notes (Signed)
Follow-up Visit   Date: 08/15/19   Tina [name] MRN: [id number] DOB: Apr 25, 1971   Interim History: Tina [name] is a 48 y.o. right-handed African-American female with uncontrolled diabetes mellitus, hypertension, hyperlipidemia, tobacco use, and chronic right foot pain returning to the clinic for follow-up of diabetic neuropathy.  The patient was accompanied to the clinic by self.  History of present illness: She has been diabetic since around 2015.  Starting 2-3 year ago, she started having numbness/tingling from her low back into the right leg. MRI lumbar spine from 2005 shows disc protrusion at L5-S1 contacting the right S1 nerve root.  She has seen sports medicine and was offered ESI, which she declined.  She completed physical therapy for low back strengthening.   She also has tingling/burning and numbness over the soles of her feet. Pain is constant and alleviated by nothing.  She takes gabapentin 858m TID for bilateral foot pain.  She was previously seen pain management and was being prescribed percocet.  She has imbalance and walks with a cane, some of which is contributed by her chronic right foot pain.  She had had prior ankle fusion and poor wound healing of this foot.   She denies leg weakness but fatigues quickly.   UPDATE 08/15/2019: She is here for follow-up visit.  Over the past month, she began having severe left hip achy pain, radiating into the left thigh and lower leg.  She went to the ER and was given prednisone and vidocin with no improvement.  She followed-up with her PCP who gave a toradol injection, meloxicam, percocet, and cyclobenzaprine.  Despite this combination of medications, she denies any improvement.  Additionally, she is already taking gabapentin 8076mthree times daily and Cymbalta 9092maily for neuropathy.  She feels that she has to drag the leg because of the pain.  She walks with a cane and has not suffered any falls. She is unable to  engage in physical therapy or see pain management due to lack of insurance.  Her diabetes remains very poorly controlled with last A1c >14 in July.  Medications:  Current Outpatient Medications on File Prior to Visit  Medication Sig Dispense Refill  . albuterol (PROVENTIL HFA;VENTOLIN HFA) 108 (90 Base) MCG/ACT inhaler Inhale 1-2 puffs into the lungs every 6 (six) hours as needed for wheezing or shortness of breath. 1 Inhaler 0  . atorvastatin (LIPITOR) 40 MG tablet TAKE 1 TABLET BY MOUTH DAILY. 90 tablet 3  . Blood Glucose Monitoring Suppl (CONTOUR NEXT ONE) KIT 1 each by Does not apply route See admin instructions. USE TO CHECK BLOOD GLUCOSE ONCE DAILY. IM PROGRAM. 1 kit 0  . cyclobenzaprine (FEXMID) 7.5 MG tablet Take 1 tablet (7.5 mg total) by mouth 3 (three) times daily as needed for muscle spasms. 90 tablet 0  . diclofenac sodium (VOLTAREN) 1 % GEL Apply 2 g topically 4 (four) times daily. 100 g 2  . DULoxetine (CYMBALTA) 60 MG capsule Take 1 capsule (60 mg total) by mouth daily. 30 capsule 5  . enalapril (VASOTEC) 20 MG tablet TAKE 1 TABLET (20 MG TOTAL) BY MOUTH DAILY. 90 tablet 3  . gabapentin (NEURONTIN) 800 MG tablet Take 1 tablet (800 mg total) by mouth 3 (three) times daily. 90 tablet 3  . glucose blood (CONTOUR NEXT TEST) test strip Please use to check your sugar 3 times a day. 100 strip 3  . hydrOXYzine (ATARAX/VISTARIL) 25 MG tablet Take 25 mg by mouth 3 (three) times  daily as needed.    . insulin aspart (NOVOLOG) 100 UNIT/ML FlexPen Inject 5 Units into the skin 3 (three) times daily with meals. (Patient taking differently: Inject 6 Units into the skin 3 (three) times daily with meals. ) 5 pen 3  . Insulin Glargine (LANTUS SOLOSTAR) 100 UNIT/ML Solostar Pen Inject 25 Units into the skin daily at 10 pm. 5 pen 3  . Insulin Pen Needle (UNIFINE PENTIPS) 31G X 5 MM MISC Inject 1 Units into the skin 3 (three) times daily. Use to inject insulin 4 times daily. IM PROGRAM 130 each 5  .  JANUMET 50-1000 MG tablet TAKE 1 TABLET BY MOUTH 2 TIMES DAILY WITH A MEAL. 60 tablet 0  . lamoTRIgine (LAMICTAL) 100 MG tablet Take 100 mg by mouth 2 (two) times daily.     . Lancets MISC 1 Units by Does not apply route 3 (three) times daily. 100 each 3  . meloxicam (MOBIC) 15 MG tablet Take 1 tablet (15 mg total) by mouth daily. 21 tablet 0  . Multiple Vitamins-Minerals (MULTIVITAMIN WITH MINERALS) tablet Take 1 tablet by mouth daily.      Marland Kitchen oxyCODONE-acetaminophen (PERCOCET) 7.5-325 MG tablet Take 1 tablet by mouth every 6 (six) hours as needed for up to 5 days for severe pain. 20 tablet 0  . sertraline (ZOLOFT) 100 MG tablet Take 100 mg by mouth daily.    . traZODone (DESYREL) 50 MG tablet Take 50 mg by mouth at bedtime.    . DULoxetine (CYMBALTA) 30 MG capsule TAKE 1 CAPSULE BY MOUTH DAILY. 30 capsule 3  . HYDROcodone-acetaminophen (NORCO/VICODIN) 5-325 MG tablet Take 1 tablet by mouth every 6 (six) hours as needed for moderate pain. 14 tablet 0  . predniSONE (DELTASONE) 10 MG tablet Take 4 tablets (40 mg total) by mouth daily. 20 tablet 0  . traMADol (ULTRAM) 50 MG tablet      No current facility-administered medications on file prior to visit.     Allergies:  Allergies  Allergen Reactions  . Codeine Other (See Comments)    hallucinations    Review of Systems:  CONSTITUTIONAL: No fevers, chills, night sweats, or weight loss.  EYES: No visual changes or eye pain ENT: No hearing changes.  No history of nose bleeds.   RESPIRATORY: No cough, wheezing and shortness of breath.   CARDIOVASCULAR: Negative for chest pain, and palpitations.   GI: Negative for abdominal discomfort, blood in stools or black stools.  No recent change in bowel habits.   GU:  No history of incontinence.   MUSCLOSKELETAL: +history of joint pain or swelling.  No myalgias.   SKIN: Negative for lesions, rash, and itching.   ENDOCRINE: Negative for cold or heat intolerance, polydipsia or goiter.   PSYCH:  No  depression or anxiety symptoms.   NEURO: As Above.   Vital Signs:  BP 140/90   Pulse (!) 102   Ht '6\' 4"'  (1.93 m)   Wt 281 lb (127.5 kg)   SpO2 99%   BMI 34.20 kg/m    General Medical Exam:   General:   Mild distress due to pain   Eyes/ENT: see cranial nerve examination.   Neck:  No carotid bruits. Respiratory:  Clear to auscultation, good air entry bilaterally.   Cardiac:  Regular rate and rhythm, no murmur.   Ext:  No edema, bilateral pes planus with bunion deformity  Neurological Exam: MENTAL STATUS including orientation to time, place, person, recent and remote memory, attention span and concentration,  language, and fund of knowledge is normal.  Speech is not dysarthric.  CRANIAL NERVES:    Normal conjugate, extra-ocular eye movements in all directions of gaze.  No ptosis.    MOTOR:  Motor strength is 5/5 in all extremities, right hip flexion shows pain limiting effort but motor strength is 5/5 with flexion, hip abduction, hip abduction, knee extension.  No atrophy, fasciculations or abnormal movements.  No pronator drift.  Tone is normal.    MSRs:                                           Right        Left brachioradialis 2+  2+  biceps 2+  2+  triceps 2+  2+  patellar 0  0  ankle jerk 0  0  Hoffman no  no  plantar response down  down    SENSORY: Vibration is reduced to 50% at the ankles and toes bilaterally, this is worse on the left.  Temperature and pinprick is reduced in the left anterior lower leg and mid-calf on the right.   COORDINATION/GAIT: Gait appears antalgic, mild dragging of the right leg assisted with a cane and stable.    Data: MRI lumbar spine without contrast 11/22/2003: 1. Moderate sized right paracentral disk protrusion L5-S1 contacting the right S1 nerve root.  2. Annular rent at L2-3 with a very shallow central disk protrusion. 3. Minimal bulging disks at L3-4 and L4-5.  Lab Results  Component Value Date   TSH 1.074 06/27/2011   Lab  Results  Component Value Date   HGBA1C >14.0 (A) 05/09/2019   Labs 04/11/2019: Vitamin B1 7*, vitamin B12 529, folate 12.9   IMPRESSION/PLAN: 1.  Left leg pain, ?L4 radiculopathy.  Symptoms are not suggestive of diabetic amyotrophy.  - MRI lumbar spine wo contrast  - She does not have insurance, so unable to refer for physical therapy  - She is already on a number of pain medications, there is not anything else I can offer beyond what her PCP is already doing  - She may need to see pain management going forward  2.  Diabetic neuropathy affecting the feet with painful paresthesias.                - Continue gabapentin 828m TID - maximal dose              - Continue Cymbatal 951mdaily   - Stressed the importance of tight diabetes management, HbA1c > 14              - She has regular follow-up with podiatry monitoring her skin lesions  3.  Thiamine deficiency contributing to neuropathy  -Continue thiamine 100 mg daily  Further recommendations pending results.   Thank you for allowing me to participate in patient's care.  If I can answer any additional questions, I would be pleased to do so.    Sincerely,    Aarika Moon K. PaPosey ProntoDO

## 2019-08-15 NOTE — Patient Instructions (Addendum)
MRI lumbar spine without contrast.  We will call you with the results.   We have sent a referral to [facility] for your MRI and they will call you directly to schedule your appointment. They are located at [address] If you need to contact them directly please call [phone]. 

## 2019-08-15 NOTE — Progress Notes (Signed)
Internal Medicine Clinic Attending  Case discussed with Dr. Melvin at the time of the visit.  We reviewed the resident's history and exam and pertinent patient test results.  I agree with the assessment, diagnosis, and plan of care documented in the resident's note.  Alexander Raines, M.D., Ph.D.  

## 2019-08-18 ENCOUNTER — Telehealth: Payer: Self-pay | Admitting: Internal Medicine

## 2019-08-18 NOTE — Telephone Encounter (Signed)
Pt requesting a phone call back.  Pt states her left leg is still hurting and taking per pain medication.  Pt wants to know what else can she take or do.

## 2019-08-19 NOTE — Telephone Encounter (Signed)
Contacted Tina [name] regarding her ongoing back pain. She states she saw her neurologist since her last visit who agreed that he pain was consistent with a flare of her lumbar disc disease. A MRI was ordered, but she was unable to have this done due to her being self pay and being unable to afford the up-front costs. She is working on obtaining insurance through her disability claim, that she was told she should hear back about in December.  We discussed that this timing may work well as sometimes this kind of pain/disease flare can resolve spontaneously in 6-8 weeks and December would be just after the 8 week mark. If she continues to have pain at that time,and MRI would certainly be indicated. She states she gets some relief with percocet and flexeril together. She was encouraged to try the flexeril alone and percocet for breakthrough if need to determine how much, if any percocet she needs. - Continue gabapentin and Cymbalta - Continue flexeril - PRN percocet

## 2019-08-22 ENCOUNTER — Encounter: Payer: Self-pay | Admitting: Podiatry

## 2019-08-22 ENCOUNTER — Ambulatory Visit (INDEPENDENT_AMBULATORY_CARE_PROVIDER_SITE_OTHER): Payer: Medicaid Other | Admitting: Podiatry

## 2019-08-22 ENCOUNTER — Other Ambulatory Visit: Payer: Self-pay

## 2019-08-22 DIAGNOSIS — M216X1 Other acquired deformities of right foot: Secondary | ICD-10-CM

## 2019-08-22 DIAGNOSIS — E1149 Type 2 diabetes mellitus with other diabetic neurological complication: Secondary | ICD-10-CM

## 2019-08-22 DIAGNOSIS — M79673 Pain in unspecified foot: Secondary | ICD-10-CM

## 2019-08-22 DIAGNOSIS — M19071 Primary osteoarthritis, right ankle and foot: Secondary | ICD-10-CM

## 2019-08-22 DIAGNOSIS — G8929 Other chronic pain: Secondary | ICD-10-CM

## 2019-08-22 DIAGNOSIS — G629 Polyneuropathy, unspecified: Secondary | ICD-10-CM

## 2019-08-22 NOTE — Progress Notes (Signed)
Subjective: 48 year old female presents the office today for Evaluation of chronic right ankle pain as well as her painful calluses to both feet.  She states that she tries to keep them offloaded will help as well as trimming but they come back having pain.  She is also been having sciatic and left leg numbness.  She saw her neurologist.  Recommended MRI but due to insurance issues not able to get this.  Left A1c was over 14.  Denies any fevers, chills, nausea, vomiting.  No calf pain, chest pain, shortness of breath.  Objective: AAO x3, NAD DP/PT pulses palpable bilaterally, CRT less than 3 seconds The wounds well-healed on her feet.  She has hyperkeratotic lesions medial second toe at the DIPJ, lateral fifth metatarsal head as well as plantar heel and on the right first interspace.  No ongoing ulceration drainage or signs of infection noted.  Hallux rigidus, bunion deformity on the right side.  Significant bunion deformity on the left side with hammertoe contracture.  History of triple arthrodesis right side.  No pain with calf compression, swelling, warmth, erythema.    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  -I debrided the hyperkeratotic lesions x4 without any complications or bleeding. New offloading pads dispnesed.  Continue offloading.  Ultimately discussed surgical intervention however A1c is uncontrolled.  Discussed this in the future.  Also continue the [place] brace for the right ankle. -Given her chronic foot pain as well as the sciatica is not able to stand for long period of time therefore she cannot return to work.  Because of this she is on long-term disability.  [name] DPM

## 2019-08-23 ENCOUNTER — Telehealth: Payer: Self-pay | Admitting: *Deleted

## 2019-08-23 NOTE — Telephone Encounter (Signed)
Joy - Dr. Benjamine Mola request call to schedule Peer to Peer with Dr. Jacqualyn Posey concerning short-term disability for pt.

## 2019-08-24 NOTE — Telephone Encounter (Signed)
Peer to peer is scheduled for 5pm tomorrow. They are going to call the office.

## 2019-08-25 ENCOUNTER — Other Ambulatory Visit: Payer: Self-pay | Admitting: Neurology

## 2019-08-25 MED ORDER — DULOXETINE HCL 60 MG PO CPEP: 60.0000 mg | ORAL_CAPSULE | Freq: Every day | ORAL | 5 refills | Status: DC

## 2019-09-05 ENCOUNTER — Telehealth: Payer: Self-pay | Admitting: Podiatry

## 2019-09-05 NOTE — Telephone Encounter (Signed)
Patient want to speak back to Dr. Jacqualyn Posey again she has more questions [phone]

## 2019-09-05 NOTE — Telephone Encounter (Signed)
Pt states she has somethings she and Dr. Jacqualyn Posey were needing to clarify for her disability.

## 2019-09-06 ENCOUNTER — Encounter: Payer: Self-pay | Admitting: Podiatry

## 2019-09-07 ENCOUNTER — Ambulatory Visit: Payer: Self-pay

## 2019-09-19 MED FILL — ENALAPRIL MALEATE 20 MG TAB: 20 | 30 days supply | Qty: 30 | Fill #3

## 2019-09-19 MED FILL — LANTUS SOLOSTAR 100 UNITS/M: 100 | 24 days supply | Qty: 6 | Fill #5

## 2019-09-20 ENCOUNTER — Other Ambulatory Visit: Payer: Self-pay

## 2019-09-20 ENCOUNTER — Ambulatory Visit (INDEPENDENT_AMBULATORY_CARE_PROVIDER_SITE_OTHER): Payer: Medicaid Other | Admitting: Podiatry

## 2019-09-20 DIAGNOSIS — E1149 Type 2 diabetes mellitus with other diabetic neurological complication: Secondary | ICD-10-CM

## 2019-09-20 DIAGNOSIS — M19071 Primary osteoarthritis, right ankle and foot: Secondary | ICD-10-CM

## 2019-09-20 DIAGNOSIS — M216X1 Other acquired deformities of right foot: Secondary | ICD-10-CM | POA: Diagnosis not present

## 2019-09-20 DIAGNOSIS — G629 Polyneuropathy, unspecified: Secondary | ICD-10-CM

## 2019-09-25 NOTE — Progress Notes (Signed)
Subjective: 48 year old female presents the office today for Evaluation of chronic right ankle pain as well as her painful calluses to both feet.  She states that she is well for some time after I trimmed the calluses vomiting, back becoming painful.  She does keep toe separator's and offloading pads which helps minimally.  She still gets swelling and catching sensation to her right ankle.  She does wear the [place] brace at times.  She is interested in orthotics however she is still awaiting her Medicaid to be improved.   She is also been having sciatic and left leg numbness.  She saw her neurologist.  Recommended MRI but due to insurance issues not able to get this.  Left A1c was over 14.  Denies any fevers, chills, nausea, vomiting.  No calf pain, chest pain, shortness of breath.  Objective: AAO x3, NAD DP/PT pulses palpable bilaterally, CRT less than 3 seconds The wounds well-healed on her feet.  She has hyperkeratotic lesions medial second toe at the DIPJ, lateral fifth metatarsal head as well as plantar heel and on the right first interspace.  No underlying ulceration, drainage, or signs of infection noted.  Hallux rigidus, bunion deformity on the right side.  Significant bunion deformity on the left side with hammertoe contracture.  History of triple arthrodesis right side.  No pain with calf compression, swelling, warmth, erythema.    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  -I debrided the hyperkeratotic lesions x4 without any complications or bleeding. New offloading pads dispnesed.  Continue offloading.  Ultimately discussed surgical intervention however A1c is uncontrolled.  Discussed this in the future.  Also continue the [place] brace for the right ankle. -Given her chronic foot pain as well as the sciatica  is not able to stand for long period of time therefore she cannot return to work.  Because of this she is on long-term disability.  [name] DPM

## 2019-09-26 ENCOUNTER — Encounter: Payer: Self-pay | Admitting: Internal Medicine

## 2019-10-10 ENCOUNTER — Other Ambulatory Visit: Payer: Self-pay

## 2019-10-10 ENCOUNTER — Ambulatory Visit (INDEPENDENT_AMBULATORY_CARE_PROVIDER_SITE_OTHER): Payer: Self-pay | Admitting: Internal Medicine

## 2019-10-10 ENCOUNTER — Encounter: Payer: Self-pay | Admitting: Internal Medicine

## 2019-10-10 ENCOUNTER — Other Ambulatory Visit (HOSPITAL_COMMUNITY)
Admission: RE | Admit: 2019-10-10 | Discharge: 2019-10-10 | Disposition: A | Discharge status: Home / Self Care | Payer: Self-pay | Source: Ambulatory Visit | Attending: Internal Medicine | Admitting: Internal Medicine

## 2019-10-10 VITALS — BP 137/72 | HR 95 | Temp 99.5°F | Ht 76.0 in | Wt 266.6 lb

## 2019-10-10 DIAGNOSIS — N898 Other specified noninflammatory disorders of vagina: Secondary | ICD-10-CM | POA: Insufficient documentation

## 2019-10-10 DIAGNOSIS — M79672 Pain in left foot: Secondary | ICD-10-CM

## 2019-10-10 DIAGNOSIS — Z872 Personal history of diseases of the skin and subcutaneous tissue: Secondary | ICD-10-CM

## 2019-10-10 DIAGNOSIS — Z6832 Body mass index (BMI) 32.0-32.9, adult: Secondary | ICD-10-CM

## 2019-10-10 DIAGNOSIS — Z791 Long term (current) use of non-steroidal anti-inflammatories (NSAID): Secondary | ICD-10-CM

## 2019-10-10 DIAGNOSIS — Z79899 Other long term (current) drug therapy: Secondary | ICD-10-CM

## 2019-10-10 DIAGNOSIS — Z87448 Personal history of other diseases of urinary system: Secondary | ICD-10-CM

## 2019-10-10 DIAGNOSIS — M79671 Pain in right foot: Secondary | ICD-10-CM | POA: Insufficient documentation

## 2019-10-10 DIAGNOSIS — E1165 Type 2 diabetes mellitus with hyperglycemia: Secondary | ICD-10-CM

## 2019-10-10 DIAGNOSIS — Z794 Long term (current) use of insulin: Secondary | ICD-10-CM

## 2019-10-10 DIAGNOSIS — R634 Abnormal weight loss: Secondary | ICD-10-CM

## 2019-10-10 DIAGNOSIS — M5432 Sciatica, left side: Secondary | ICD-10-CM

## 2019-10-10 DIAGNOSIS — E1140 Type 2 diabetes mellitus with diabetic neuropathy, unspecified: Secondary | ICD-10-CM

## 2019-10-10 LAB — POCT GLYCOSYLATED HEMOGLOBIN (HGB A1C): Hemoglobin A1C: 12.9 % — AB (ref 4.0–5.6)

## 2019-10-10 LAB — GLUCOSE, CAPILLARY: Glucose-Capillary: 407 mg/dL — ABNORMAL HIGH (ref 70–99)

## 2019-10-10 MED ORDER — LANTUS SOLOSTAR 100 UNIT/ML ~~LOC~~ SOPN: 35.0000 [IU] | PEN_INJECTOR | Freq: Every day | SUBCUTANEOUS | 6 refills | Status: DC

## 2019-10-10 MED ORDER — IBUPROFEN 800 MG PO TABS: 800.0000 mg | ORAL_TABLET | Freq: Three times a day (TID) | ORAL | 0 refills | Status: DC | PRN

## 2019-10-10 MED ORDER — JANUMET 50-1000 MG PO TABS: ORAL_TABLET | ORAL | 12 refills | Status: DC

## 2019-10-10 MED ORDER — ATORVASTATIN CALCIUM 40 MG PO TABS: 40.0000 mg | ORAL_TABLET | Freq: Every day | ORAL | 11 refills | Status: DC

## 2019-10-10 MED FILL — LANTUS SOLOSTAR 100 UNITS/M: 100 | 25 days supply | Qty: 9 | Fill #0

## 2019-10-10 MED FILL — JANUMET 50-1,000 MG TABLET: 50-1000 | 30 days supply | Qty: 60 | Fill #0

## 2019-10-10 MED FILL — IBUPROFEN 800 MG TABS: 800 | 13 days supply | Qty: 40 | Fill #0

## 2019-10-10 MED FILL — ATORVASTATIN 40 MG TABLET: 40 | 30 days supply | Qty: 30 | Fill #0

## 2019-10-10 NOTE — Assessment & Plan Note (Signed)
Patient has a long history of bilateral feet pain secondary to deformities and diabetic neuropathy. This is associated with L sciatica. Follows up closely with podiatry and neurology with plan for lumbar MRI for further evaluation. She has been on NSAIDs, gabapentin, Cymbalta, Flexeril, and has had a short course of oxycodone.  Reports they onl thing that helped in the past was Ibuprofen 800 mg TID PRN.  - Trial of Ibuprofen 800 mg TID PRN x 2-3 weeks, nl kidney function 01/2019 - Discussed this may not be long term due to potential nephrotoxicity  - Discussed working on glycemic control, see A&P for DM

## 2019-10-10 NOTE — Assessment & Plan Note (Signed)
Patient presents complaining of vaginal itching and discharge for the past 2 weeks that is associated with a dark groin rash that is itchy.  She has a history of recurrent vaginal candidiasis due to uncontrolled T2DM.  I suspect this is vaginal candidiasis again but vaginal swab performed today to rule out other sections. -Follow-up vaginal swab, plan to send Diflucan and nystatin powder if yeast present

## 2019-10-10 NOTE — Patient Instructions (Addendum)
Tina [name],   For your diabetes: Increase Lantus to 35 units every night.  Continue taking NovoLog 6 units with meals.  I refilled your Lantus and atorvastatin. Butch Penny will give you a call in 2 weeks to check how your sugars are doing.   Your foot pain: I sent a prescription for ibuprofen 800 mg you can take 3 times only as needed for pain.  For the vaginal discharge: We did a vaginal swab today.  I should get those results in a couple of days.  We will call you with the results.  Your next Depo shot is due at the end of the December. It is too early to give it now.   Follow up with me in 3 months to follow up on your diabetes.  - Dr. Frederico Hamman

## 2019-10-10 NOTE — Progress Notes (Signed)
   CC: T2DM, vaginal itching and discharge, foot pain   HPI:  Tina [name] is a 48 y.o. year-old female with PMH listed below who presents to clinic for T2DM, vaginal itching, and foot pain. Please see problem based assessment and plan for further details.   Past Medical History:  Diagnosis Date  . Diabetes mellitus    type II  . Fibroids   . HTN (hypertension)   . Hyperlipidemia   . Iron deficiency anemia   . Left acetabular fracture ([place])   . Lumbar strain   . Obesity   . Ovarian cyst    Review of Systems:   Review of Systems  Constitutional: Positive for weight loss. Negative for chills and fever.  Genitourinary: Positive for frequency and urgency. Negative for dysuria.       Vaginal discharge and itching   Musculoskeletal: Positive for joint pain.  Endo/Heme/Allergies: Positive for polydipsia.    Physical Exam:  Vitals:   10/10/19 1333 10/10/19 1335  BP:  137/72  Pulse:  95  Temp:  99.5 F (37.5 C)  TempSrc:  Oral  SpO2:  100%  Weight: 266 lb 9.6 oz (120.9 kg)   Height: 6\' 4"  (1.93 m)     General: well-appearing female in NAD  Cardiac: regular rate and rhythm, nl S1/S2, no murmurs, rubs or gallops, mild bilateral nonpitting edema  Pulm: CTAB, no wheezes or crackles, no increased work of breathing on room air    Assessment & Plan:   See Encounters Tab for problem based charting.  Patient discussed with Dr. Angelia Mould

## 2019-10-10 NOTE — Progress Notes (Signed)
129

## 2019-10-10 NOTE — Assessment & Plan Note (Signed)
Patient presents for follow-up of uncontrolled T2 DM.  She is on Lantus 25 and NovoLog 6 units 3 times daily with meals.  Also supposed to be on Janumet and atorvastatin but not currently taking due to no refills.  A1c is 12.9 from 14 six months ago.  She is experiencing polyuria, polydipsia, and has had a 15 pound weight loss since last seen. -Increase Lantus to 35 units nightly, refill today -Refilled atorvastatin 40 mg daily Janumet 50-1000 mg BID  - Will follow up in 2 weeks by phone to see how BG is doing  - Up to date with foot exam  - Need eye exam but uninsured, working on [employer] application

## 2019-10-12 LAB — CERVICOVAGINAL ANCILLARY ONLY
Bacterial Vaginitis (gardnerella): POSITIVE — AB
Candida Glabrata: NEGATIVE
Candida Vaginitis: POSITIVE — AB
Chlamydia: NEGATIVE
Comment: NEGATIVE
Comment: NEGATIVE
Comment: NEGATIVE
Comment: NEGATIVE
Comment: NEGATIVE
Comment: NORMAL
Neisseria Gonorrhea: NEGATIVE
Trichomonas: NEGATIVE

## 2019-10-12 NOTE — Progress Notes (Signed)
Internal Medicine Clinic Attending  Case discussed with Dr. Santos-Sanchez at the time of the visit.  We reviewed the resident's history and exam and pertinent patient test results.  I agree with the assessment, diagnosis, and plan of care documented in the resident's note.    

## 2019-10-14 ENCOUNTER — Other Ambulatory Visit: Payer: Self-pay | Admitting: Internal Medicine

## 2019-10-14 MED ORDER — NYSTATIN 100000 UNIT/GM EX POWD: Freq: Four times a day (QID) | CUTANEOUS | 3 refills | Status: DC

## 2019-10-14 MED ORDER — FLUCONAZOLE 150 MG PO TABS: 150.0000 mg | ORAL_TABLET | ORAL | 0 refills | Status: DC

## 2019-10-14 MED ORDER — METRONIDAZOLE 500 MG PO TABS: 500.0000 mg | ORAL_TABLET | Freq: Two times a day (BID) | ORAL | 0 refills | Status: DC

## 2019-10-14 MED FILL — FLUCONAZOLE 150 MG TABLET: 150 | 14 days supply | Qty: 2 | Fill #0

## 2019-10-14 MED FILL — metroNIDAZOLE 500 MG TABS: 500 | 7 days supply | Qty: 14 | Fill #0

## 2019-10-14 MED FILL — NYSTATIN 100000 UNIT/GM POW: 100000 | 5 days supply | Qty: 15 | Fill #0

## 2019-10-18 ENCOUNTER — Encounter: Payer: Self-pay | Admitting: Podiatry

## 2019-10-18 ENCOUNTER — Ambulatory Visit (INDEPENDENT_AMBULATORY_CARE_PROVIDER_SITE_OTHER): Payer: Medicaid Other

## 2019-10-18 ENCOUNTER — Other Ambulatory Visit: Payer: Self-pay

## 2019-10-18 ENCOUNTER — Ambulatory Visit (INDEPENDENT_AMBULATORY_CARE_PROVIDER_SITE_OTHER): Payer: Medicaid Other | Admitting: Podiatry

## 2019-10-18 DIAGNOSIS — L84 Corns and callosities: Secondary | ICD-10-CM

## 2019-10-18 DIAGNOSIS — M19071 Primary osteoarthritis, right ankle and foot: Secondary | ICD-10-CM

## 2019-10-18 DIAGNOSIS — G8929 Other chronic pain: Secondary | ICD-10-CM

## 2019-10-18 DIAGNOSIS — M216X1 Other acquired deformities of right foot: Secondary | ICD-10-CM | POA: Diagnosis not present

## 2019-10-18 DIAGNOSIS — G629 Polyneuropathy, unspecified: Secondary | ICD-10-CM | POA: Diagnosis not present

## 2019-10-18 DIAGNOSIS — E1149 Type 2 diabetes mellitus with other diabetic neurological complication: Secondary | ICD-10-CM | POA: Diagnosis not present

## 2019-10-18 DIAGNOSIS — M79673 Pain in unspecified foot: Secondary | ICD-10-CM

## 2019-10-18 DIAGNOSIS — M7751 Other enthesopathy of right foot: Secondary | ICD-10-CM | POA: Diagnosis not present

## 2019-10-19 NOTE — Progress Notes (Signed)
Subjective: 48 year old female presents the office today for evaluation of chronic right ankle pain as well as her painful calluses to both feet.  She also states that the right ankle is been hurting more recently.  She points on the anterior medial aspect of the ankle joint in the right side.  She said that the swelling she states has been catching more.  She gets a stabbing pain at times to the ankle joint.  She wears the brace at times.  She is wearing flip-flops today.  No recent injury or changes otherwise.   She is also been having sciatic and left leg numbness.  She saw her neurologist.  Recommended MRI but due to insurance issues not able to get this.  Left A1c was 12.9.  Denies any fevers, chills, nausea, vomiting.  No calf pain, chest pain, shortness of breath.  Objective: AAO x3, NAD DP/PT pulses palpable bilaterally, CRT less than 3 seconds The wounds well-healed on her feet.  Tenderness palpation on the anterior medial aspect of the right ankle joint and there is pain with ankle joint range of motion.  No area pinpoint tenderness.  Mild edema to this area which is localized and there is no erythema or warmth. She has hyperkeratotic lesions medial second toe at the DIPJ, lateral fifth metatarsal head as well as plantar heel and on the right first interspace.  No underlying ulceration, drainage, or signs of infection noted.  Hallux rigidus, bunion deformity on the right side.  Significant bunion deformity on the left side with hammertoe contracture.  History of triple arthrodesis right side.  No pain with calf compression, swelling, warmth, erythema.    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 -X-rays obtained reviewed.  Arthritic changes present to the ankle joint with valgus deformity.  History of previous  triple arthrodesis noted. -Steroid injection performed for the right ankle.  See procedure note below.  Dispensed cam boot for immobilization given the pain. -I debrided the hyperkeratotic lesions x4 without any complications or bleeding. New offloading pads dispnesed.  Continue offloading.  Ultimately discussed surgical intervention however A1c is uncontrolled.  Discussed this in the future.  Also continue the [place] brace for the right ankle. -Given her chronic foot pain as well as the sciatica is not able to stand for long period of time therefore she cannot return to work.  Because of this she is on long-term disability. -Handicap placard paperwork provided today  Procedure: Injection intermediate joint Discussed alternatives, risks, complications and verbal consent was obtained.  Location: Right ankle anterior medial ankle joint Skin Prep: Betadine. Injectate: 0.5cc 0.5% marcaine plain, 0.5 cc 2% lidocaine plain and, 1 cc kenalog 10. Disposition: Patient tolerated procedure well. Injection site dressed with a band-aid.  Post-injection care was discussed and return precautions discussed.   Return in about 4 weeks (around 11/15/2019).   [name] DPM

## 2019-11-07 ENCOUNTER — Telehealth: Payer: Self-pay | Admitting: Internal Medicine

## 2019-11-07 ENCOUNTER — Other Ambulatory Visit: Payer: Self-pay | Admitting: Internal Medicine

## 2019-11-07 DIAGNOSIS — M79671 Pain in right foot: Secondary | ICD-10-CM

## 2019-11-07 MED FILL — GABAPENTIN 800 MG TABLET: 800 | 30 days supply | Qty: 90 | Fill #2

## 2019-11-07 MED FILL — ATORVASTATIN 40 MG TABLET: 40 | 30 days supply | Qty: 30 | Fill #1

## 2019-11-07 MED FILL — ENALAPRIL MALEATE 20 MG TAB: 20 | 30 days supply | Qty: 30 | Fill #4

## 2019-11-09 ENCOUNTER — Other Ambulatory Visit: Payer: Self-pay | Admitting: Internal Medicine

## 2019-11-09 DIAGNOSIS — M79672 Pain in left foot: Secondary | ICD-10-CM

## 2019-11-09 DIAGNOSIS — M79671 Pain in right foot: Secondary | ICD-10-CM

## 2019-11-09 MED ORDER — IBUPROFEN 800 MG PO TABS: 800.0000 mg | ORAL_TABLET | Freq: Three times a day (TID) | ORAL | 0 refills | Status: DC | PRN

## 2019-11-09 MED FILL — NOVOLOG FLEXPEN SYRINGE: 100 | 20 days supply | Qty: 3 | Fill #6

## 2019-11-09 MED FILL — IBUPROFEN 800 MG TABS: 800 | 13 days supply | Qty: 40 | Fill #0

## 2019-11-09 NOTE — Telephone Encounter (Signed)
Done

## 2019-11-10 MED FILL — LANTUS SOLOSTAR 100 UNITS/M: 100 | 25 days supply | Qty: 9 | Fill #1

## 2019-11-15 ENCOUNTER — Encounter: Payer: Self-pay | Admitting: Podiatry

## 2019-11-15 ENCOUNTER — Other Ambulatory Visit: Payer: Self-pay | Admitting: Internal Medicine

## 2019-11-15 ENCOUNTER — Other Ambulatory Visit: Payer: Self-pay

## 2019-11-15 ENCOUNTER — Ambulatory Visit (INDEPENDENT_AMBULATORY_CARE_PROVIDER_SITE_OTHER): Payer: Self-pay | Admitting: Podiatry

## 2019-11-15 ENCOUNTER — Ambulatory Visit (INDEPENDENT_AMBULATORY_CARE_PROVIDER_SITE_OTHER): Payer: MEDICAID

## 2019-11-15 DIAGNOSIS — M7752 Other enthesopathy of left foot: Secondary | ICD-10-CM

## 2019-11-15 DIAGNOSIS — E1149 Type 2 diabetes mellitus with other diabetic neurological complication: Secondary | ICD-10-CM

## 2019-11-15 DIAGNOSIS — M79672 Pain in left foot: Secondary | ICD-10-CM

## 2019-11-15 DIAGNOSIS — M7751 Other enthesopathy of right foot: Secondary | ICD-10-CM

## 2019-11-15 DIAGNOSIS — M19071 Primary osteoarthritis, right ankle and foot: Secondary | ICD-10-CM

## 2019-11-15 DIAGNOSIS — L84 Corns and callosities: Secondary | ICD-10-CM

## 2019-11-16 NOTE — Progress Notes (Signed)
Subjective: 49 year old female presents the office today for evaluation of chronic right ankle pain as well as her painful calluses to both feet.  She also states that she did fall injuring her left big toe.  She does not think she broke it but has been tender and she wants to make sure.  She  presents today wearing flip-flops.  She was wearing the cam boot on the right side but due to her sciatica issues in the left side she stopped wearing this.    Left A1c was 12.9 on 10/10/2019  Denies any fevers, chills, nausea, vomiting.  No calf pain, chest pain, shortness of breath.  Objective: AAO x3, NAD DP/PT pulses palpable bilaterally, CRT less than 3 seconds The wounds well-healed on her feet.  Hyperkeratotic tissue present left medial second toe and first interspace right foot.  No underlying ulceration, drainage or any signs of infections are noted. Mild tenderness palpation left hallux with minimal edema.  There is no erythema or warmth. Still tenderness palpation along the right ankle joint minimal swelling.  Pain with range of motion of the ankle joint.  No areas of point tenderness on the right ankle. No pain with calf compression, swelling, warmth, erythema.    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 -X-rays obtained reviewed of the left foot.  No evidence of acute fracture.  Arthritic changes present with severe bunion and hammertoe deformities present. -Debrided the hyperkeratotic lesions x2 without any complications or bleeding -And also left hallux pain immobilization surgical shoe.  As she starts to feel better she can transition to regular shoe. -Given the chronic ankle pain on the right side I discussed her going back to when the [place] brace we discussed that she is a better shoes.  Hopefully once she gets  her insurance straightened out we can try it custom shoes. -Continue follow-up with neurology.  She is awaiting MRI  Return in about 4 weeks (around 12/13/2019).  [name] DPM

## 2019-11-17 ENCOUNTER — Telehealth: Payer: Self-pay | Admitting: Internal Medicine

## 2019-11-17 NOTE — Telephone Encounter (Signed)
Pt requesting a nurse to callback [phone]

## 2019-11-17 NOTE — Telephone Encounter (Signed)
Pt calls and states that the yeast and BV never cleared up completely, she states you told her you would just call something else in all she needed to do was call.  Please advise

## 2019-11-22 ENCOUNTER — Telehealth: Payer: Self-pay | Admitting: Internal Medicine

## 2019-11-22 ENCOUNTER — Other Ambulatory Visit: Payer: Self-pay | Admitting: Internal Medicine

## 2019-11-22 DIAGNOSIS — D219 Benign neoplasm of connective and other soft tissue, unspecified: Secondary | ICD-10-CM

## 2019-11-22 MED ORDER — FLUCONAZOLE 150 MG PO TABS: ORAL_TABLET | ORAL | 0 refills | Status: DC

## 2019-11-22 MED ORDER — METRONIDAZOLE 500 MG PO TABS: 500.0000 mg | ORAL_TABLET | Freq: Two times a day (BID) | ORAL | 0 refills | Status: DC

## 2019-11-22 MED FILL — FLUCONAZOLE 150 MG TABLET: 150 | 2 days supply | Qty: 2 | Fill #0

## 2019-11-22 MED FILL — metroNIDAZOLE 500 MG TABS: 500 | 7 days supply | Qty: 14 | Fill #0

## 2019-11-22 NOTE — Telephone Encounter (Signed)
Sent another course of Flagyl and diflucan to her pharmacy.

## 2019-11-22 NOTE — Telephone Encounter (Signed)
Called pt - asked if rxs for yeast infection sent to the pharmacy; informed pt they (Diflucn and Flagyl) were sent to [facility] this morning. Also asked if it's time to get her Depo shot; upon viewing her chart, last one was 9/28 - should had her next one 12/14-12/28. So she's passed due and will need an urine pregnancy test. Lab appt schedule for tomorrow @ 0900 AM. POC order place, please review and sign if ok. Thanks

## 2019-11-22 NOTE — Telephone Encounter (Signed)
Pt requesting a nurse to callback [phone]

## 2019-11-23 ENCOUNTER — Other Ambulatory Visit (INDEPENDENT_AMBULATORY_CARE_PROVIDER_SITE_OTHER): Payer: Self-pay

## 2019-11-23 ENCOUNTER — Other Ambulatory Visit: Payer: Self-pay

## 2019-11-23 DIAGNOSIS — D219 Benign neoplasm of connective and other soft tissue, unspecified: Secondary | ICD-10-CM

## 2019-11-23 LAB — POCT URINE PREGNANCY: Preg Test, Ur: NEGATIVE

## 2019-11-23 MED ORDER — MEDROXYPROGESTERONE ACETATE 150 MG/ML IM SUSY
150.0000 mg | PREFILLED_SYRINGE | Freq: Once | INTRAMUSCULAR | Status: AC
  Administered 2019-11-23: 150 mg via INTRAMUSCULAR

## 2019-11-23 NOTE — Progress Notes (Signed)
     Patient presents for Depo Provera injection States feeling well Last injection received 08/01/2019  Patient is overdue for injection. Urine pregnancy negative  Depo Provera given IM LUOQ. Patient tolerated well.  Next injection due April 7-21, 2021 Appt made for 02/15/2020 at 0900 Reminder card given L. Donald Memoli, BSN, RN-BC

## 2019-11-23 NOTE — Addendum Note (Signed)
Addended by: Velora Heckler on: 11/23/2019 09:25 AM   Modules accepted: Orders

## 2019-12-07 ENCOUNTER — Other Ambulatory Visit: Payer: Self-pay | Admitting: Podiatry

## 2019-12-07 DIAGNOSIS — M7752 Other enthesopathy of left foot: Secondary | ICD-10-CM

## 2019-12-13 MED FILL — ENALAPRIL MALEATE 20 MG TAB: 20 | 30 days supply | Qty: 30 | Fill #5

## 2019-12-13 MED FILL — ATORVASTATIN 40 MG TABLET: 40 | 30 days supply | Qty: 30 | Fill #2

## 2019-12-13 MED FILL — LANTUS SOLOSTAR 100 UNITS/M: 100 | 25 days supply | Qty: 9 | Fill #2

## 2019-12-15 ENCOUNTER — Other Ambulatory Visit: Payer: Self-pay

## 2019-12-15 ENCOUNTER — Ambulatory Visit (INDEPENDENT_AMBULATORY_CARE_PROVIDER_SITE_OTHER): Payer: Self-pay | Admitting: Podiatry

## 2019-12-15 DIAGNOSIS — L84 Corns and callosities: Secondary | ICD-10-CM

## 2019-12-15 DIAGNOSIS — M19071 Primary osteoarthritis, right ankle and foot: Secondary | ICD-10-CM

## 2019-12-15 DIAGNOSIS — E1149 Type 2 diabetes mellitus with other diabetic neurological complication: Secondary | ICD-10-CM

## 2019-12-15 DIAGNOSIS — M7751 Other enthesopathy of right foot: Secondary | ICD-10-CM

## 2019-12-15 MED ORDER — MELOXICAM 15 MG PO TABS: 15.0000 mg | ORAL_TABLET | Freq: Every day | ORAL | 0 refills | Status: DC

## 2019-12-15 MED FILL — MELOXICAM 15 MG TABLET: 15 | 30 days supply | Qty: 30 | Fill #0

## 2019-12-20 MED FILL — NOVOLOG FLEXPEN SYRINGE: 100 | 20 days supply | Qty: 3 | Fill #7

## 2019-12-20 MED FILL — JANUMET 50-1,000 MG TABLET: 50-1000 | 30 days supply | Qty: 60 | Fill #1

## 2019-12-20 NOTE — Progress Notes (Signed)
Subjective: 49 year old female presents the office today for evaluation of chronic right ankle pain as well as her painful calluses to both feet. She states that her ankle pain is intermittent as well as the swelling. No significant swelling today. Her main concern is the callus in between her first and second toe on the right foot as well as the medial second toe on the left foot. She has no new injuries or any other new concerns.  Left A1c was 12.9 on 10/10/2019  Denies any fevers, chills, nausea, vomiting.  No calf pain, chest pain, shortness of breath.  Objective: AAO x3, NAD DP/PT pulses palpable bilaterally, CRT less than 3 seconds The wounds well-healed on her feet.  Hyperkeratotic tissue present left medial second toe and first interspace right foot.  No underlying ulceration, drainage or any signs of infections are noted. No significant tenderness to the left hallux today. Still mild tenderness palpation along the right ankle joint minimal swelling.  Pain with range of motion of the ankle joint.  No areas of point tenderness on the right ankle. No pain with calf compression, swelling, warmth, erythema.    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 -Debrided hyperkeratotic lesions x2 without any complications or bleeding -Continue offloading. Moisturizer daily. -Recommend wearing supportive shoes as well as the ankle brace on the right side. -Continue follow-up with neurology.  She is awaiting MRI pending insurance.  Return in about 4 weeks   [name] DPM

## 2020-01-16 ENCOUNTER — Ambulatory Visit: Payer: MEDICAID | Admitting: Podiatry

## 2020-01-17 MED FILL — LANTUS SOLOSTAR 100 UNITS/M: 100 | 25 days supply | Qty: 9 | Fill #3

## 2020-01-17 MED FILL — NOVOLOG FLEXPEN SYRINGE: 100 | 20 days supply | Qty: 3 | Fill #8

## 2020-01-26 ENCOUNTER — Other Ambulatory Visit: Payer: Self-pay

## 2020-01-26 ENCOUNTER — Encounter: Payer: Self-pay | Admitting: Podiatry

## 2020-01-26 ENCOUNTER — Ambulatory Visit (INDEPENDENT_AMBULATORY_CARE_PROVIDER_SITE_OTHER): Payer: MEDICAID | Admitting: Podiatry

## 2020-01-26 DIAGNOSIS — M19071 Primary osteoarthritis, right ankle and foot: Secondary | ICD-10-CM

## 2020-01-26 DIAGNOSIS — L84 Corns and callosities: Secondary | ICD-10-CM

## 2020-01-26 DIAGNOSIS — E1149 Type 2 diabetes mellitus with other diabetic neurological complication: Secondary | ICD-10-CM

## 2020-01-26 DIAGNOSIS — M7751 Other enthesopathy of right foot: Secondary | ICD-10-CM

## 2020-01-29 NOTE — Progress Notes (Signed)
Subjective: 49 year old female presents the office today for evaluation of chronic right ankle pain as well as her painful calluses to both feet.  So there is quite a bit of pain to the callus sites on her left medial second toe as well as the first interspace on the right foot.  Also her right ankle still catches at times and swells.  No recent injury or falls or changes otherwise.  She is still awaiting MRI of her back as well as neurology evaluation but she states that she is still waiting for insurance. Denies any fevers, chills, nausea, vomiting.  No calf pain, chest pain, shortness of breath.  Left A1c was 12.9 on 10/10/2019  Objective: AAO x3, NAD DP/PT pulses palpable bilaterally, CRT less than 3 seconds The wounds well-healed on her feet.  Hyperkeratotic tissue present left medial second toe and first interspace right foot.  No underlying ulceration, drainage or any signs of infections are noted. No significant tenderness to the left hallux today. Mild swelling to the anterior medial aspect of the right ankle but there is no sign of pain today.  No other areas of pinpoint tenderness. No pain with calf compression, swelling, warmth, erythema.    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 -Debrided hyperkeratotic lesions x2 without any complications or bleeding -Continue offloading. Moisturizer daily. -She is wearing flip-flops today.  Discussed supportive shoes. -Continue follow-up with neurology.  She is awaiting MRI pending insurance.  Return in about 4 weeks   [name] DPM

## 2020-01-31 ENCOUNTER — Other Ambulatory Visit: Payer: Self-pay | Admitting: Internal Medicine

## 2020-01-31 DIAGNOSIS — M79671 Pain in right foot: Secondary | ICD-10-CM

## 2020-01-31 MED FILL — ENALAPRIL MALEATE 20 MG TAB: 20 | 30 days supply | Qty: 30 | Fill #6

## 2020-01-31 MED FILL — IBUPROFEN 800 MG TABS: 800 | 13 days supply | Qty: 40 | Fill #0

## 2020-01-31 MED FILL — ATORVASTATIN 40 MG TABLET: 40 | 30 days supply | Qty: 30 | Fill #3

## 2020-02-15 ENCOUNTER — Ambulatory Visit (INDEPENDENT_AMBULATORY_CARE_PROVIDER_SITE_OTHER): Payer: Self-pay

## 2020-02-15 DIAGNOSIS — D219 Benign neoplasm of connective and other soft tissue, unspecified: Secondary | ICD-10-CM

## 2020-02-15 MED ORDER — MEDROXYPROGESTERONE ACETATE 150 MG/ML IM SUSY
150.0000 mg | PREFILLED_SYRINGE | Freq: Once | INTRAMUSCULAR | Status: AC
  Administered 2020-02-15: 150 mg via INTRAMUSCULAR

## 2020-02-21 MED FILL — LANTUS SOLOSTAR 100 UNITS/M: 100 | 25 days supply | Qty: 9 | Fill #4

## 2020-02-21 MED FILL — NOVOLOG FLEXPEN SYRINGE: 100 | 20 days supply | Qty: 3 | Fill #9

## 2020-02-23 ENCOUNTER — Other Ambulatory Visit: Payer: Self-pay

## 2020-02-23 ENCOUNTER — Ambulatory Visit (INDEPENDENT_AMBULATORY_CARE_PROVIDER_SITE_OTHER): Payer: Self-pay | Admitting: Podiatry

## 2020-02-23 DIAGNOSIS — M7751 Other enthesopathy of right foot: Secondary | ICD-10-CM

## 2020-02-23 DIAGNOSIS — L84 Corns and callosities: Secondary | ICD-10-CM

## 2020-02-23 DIAGNOSIS — M19071 Primary osteoarthritis, right ankle and foot: Secondary | ICD-10-CM

## 2020-02-23 NOTE — Progress Notes (Signed)
Subjective: 49 year old female presents the office today for evaluation of chronic right ankle pain as well as her painful calluses to both feet as well as her right ankle pain.  She states the right ankle will catch and swell at times.  The calluses are still causing pain.  No recent injury and she has no other concerns today.Denies any fevers, chills, nausea, vomiting.  No calf pain, chest pain, shortness of breath.  Left A1c was 12.9 on 10/10/2019  Objective: AAO x3, NAD DP/PT pulses palpable bilaterally, CRT less than 3 seconds The wounds well-healed on her feet.  Hyperkeratotic tissue present left medial second toe and first interspace right foot.  No underlying ulceration, drainage or any signs of infections are noted. No significant tenderness to the left hallux today. Mild swelling to the anterior medial aspect of the right ankle but there is no sign of pain today.  No other areas of pinpoint tenderness.  Mild crepitation with ankle joint range of motion. No pain with calf compression, swelling, warmth, erythema.    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 -Debrided hyperkeratotic lesions x2 without any complications or bleeding -Continue offloading. Moisturizer daily. -She is wearing flip-flops today.  Discussed supportive shoes. -Continue follow-up with neurology.  She is awaiting MRI pending insurance.  Return in about 4 weeks   [name] DPM

## 2020-02-24 ENCOUNTER — Encounter: Payer: Self-pay | Admitting: *Deleted

## 2020-03-19 MED FILL — ATORVASTATIN 40 MG TABLET: 40 | 30 days supply | Qty: 30 | Fill #4

## 2020-03-19 MED FILL — JANUMET 50-1,000 MG TABLET: 50-1000 | 30 days supply | Qty: 60 | Fill #2

## 2020-03-19 MED FILL — ENALAPRIL MALEATE 20 MG TAB: 20 | 30 days supply | Qty: 30 | Fill #7

## 2020-03-19 MED FILL — LANTUS SOLOSTAR 100 UNITS/M: 100 | 25 days supply | Qty: 9 | Fill #5

## 2020-03-20 ENCOUNTER — Other Ambulatory Visit: Payer: Self-pay

## 2020-03-20 ENCOUNTER — Ambulatory Visit (INDEPENDENT_AMBULATORY_CARE_PROVIDER_SITE_OTHER): Payer: Self-pay | Admitting: Podiatry

## 2020-03-20 ENCOUNTER — Encounter: Payer: Self-pay | Admitting: Podiatry

## 2020-03-20 DIAGNOSIS — M7751 Other enthesopathy of right foot: Secondary | ICD-10-CM

## 2020-03-20 DIAGNOSIS — L84 Corns and callosities: Secondary | ICD-10-CM

## 2020-03-20 DIAGNOSIS — E1149 Type 2 diabetes mellitus with other diabetic neurological complication: Secondary | ICD-10-CM

## 2020-03-20 DIAGNOSIS — M19071 Primary osteoarthritis, right ankle and foot: Secondary | ICD-10-CM

## 2020-03-20 MED ORDER — DICLOFENAC SODIUM 75 MG PO TBEC: 75.0000 mg | DELAYED_RELEASE_TABLET | Freq: Two times a day (BID) | ORAL | 1 refills | Status: DC

## 2020-03-20 MED FILL — DICLOFENAC SOD EC 75 MG TAB: 75 | 15 days supply | Qty: 30 | Fill #0

## 2020-03-22 ENCOUNTER — Ambulatory Visit (INDEPENDENT_AMBULATORY_CARE_PROVIDER_SITE_OTHER): Payer: Self-pay | Admitting: Internal Medicine

## 2020-03-22 ENCOUNTER — Other Ambulatory Visit: Payer: Self-pay

## 2020-03-22 ENCOUNTER — Telehealth: Payer: Self-pay | Admitting: *Deleted

## 2020-03-22 DIAGNOSIS — F32A Depression, unspecified: Secondary | ICD-10-CM

## 2020-03-22 DIAGNOSIS — U071 COVID-19: Secondary | ICD-10-CM

## 2020-03-22 DIAGNOSIS — B373 Candidiasis of vulva and vagina: Secondary | ICD-10-CM

## 2020-03-22 DIAGNOSIS — F329 Major depressive disorder, single episode, unspecified: Secondary | ICD-10-CM

## 2020-03-22 DIAGNOSIS — B3731 Acute candidiasis of vulva and vagina: Secondary | ICD-10-CM

## 2020-03-22 MED ORDER — FLUCONAZOLE 150 MG PO TABS: 150.0000 mg | ORAL_TABLET | Freq: Every day | ORAL | 0 refills | Status: AC

## 2020-03-22 MED FILL — FLUCONAZOLE 150 MG TABLET: 150 | 2 days supply | Qty: 2 | Fill #0

## 2020-03-22 NOTE — Progress Notes (Signed)
   CC: vaginal discharge  This is a telephone encounter between [facility] and [place] on 03/22/2020 for vaginal discharge and itching for ~3 days. The visit was conducted with the patient located at home and [place] at [facility]. The patient's identity was confirmed using their DOB and current address. The patient has consented to being evaluated through a telephone encounter and understands the associated risks (an examination cannot be done and the patient may need to come in for an appointment) / benefits (allows the patient to remain at home, decreasing exposure to coronavirus). I personally spent 10 minutes on medical discussion.   HPI:  Tina [name] is a 49 y.o. with PMH as below.   Please see A&P for assessment of the patient's acute and chronic medical conditions.   Past Medical History:  Diagnosis Date  . Diabetes mellitus    type II  . Fibroids   . HTN (hypertension)   . Hyperlipidemia   . Iron deficiency anemia   . Left acetabular fracture ([place])   . Lumbar strain   . Obesity   . Ovarian cyst    Review of Systems:  Negative except as stated in HPI.    Assessment & Plan:   See Encounters Tab for problem based charting.  Patient discussed with Dr. Evette Doffing

## 2020-03-22 NOTE — Assessment & Plan Note (Signed)
Patient with history of depression and anxiety on cymbalta 60mg  daily. She notes current episode of severe depression as her mother is in Hospice and her brother is on ventilator with COVID pneumonia. She notes that she is dealing with a lot of stress at the moment. Offered support.

## 2020-03-22 NOTE — Assessment & Plan Note (Signed)
Patient notes seeing her brother over the weekend who tested COVID positive at a family event. Patient and her children also got tested for COVID and she notes that she was noted to be COVID positive. She currently notes some symptoms of fatigue and cough but denies any fevers/chills, chest pain or shortness of breath. She is self medicating with tylenol and Robitussin for cough. Advised patient for continued symptomatic management at this time and for quarantine. She expresses understanding.

## 2020-03-22 NOTE — Assessment & Plan Note (Addendum)
Tina [name] reports 3 days of white cottage cheese vaginal discharge associated with vaginal itching. She denies any urinary symptoms or fevers/chills. She notes that she has history of recurrent vaginal candidiasis and this feels similar to prior episodes. She notes that she recently changed her body soap and attributes her symptoms to this. I suspect that the change in her body soap, in addition to her continued uncontrolled diabetes, is contributing to her recurrent vaginal candidiasis. Patient's last HbA1c 12.9 in 10/2019. She is on Lantus 35U and Novolog 6U tid with meals. She notes that her CBGs have been fluctuating, but have mostly been >150 recently.  Prior vaginal cultures with candida and BV. Currently, symptoms consistent with vaginal candidiasis. Will treat with Diflucan at this time.   Plan - Diflucan 150mg  x2 - F/u in clinic with PCP for repeat A1c and DM management

## 2020-03-22 NOTE — Telephone Encounter (Signed)
Pt states she has a UTI and dr Frederico Hamman told her she would just call something in for her next time, this supposedly was told to her in dec 2020. States she cant come in anyway because she has COVID, states she was diag 5/18 and cant remember where she went to get tested. She is out at the store at this time with her nephew.  [facility] today at [phone]

## 2020-03-23 NOTE — Progress Notes (Signed)
Internal Medicine Clinic Attending ° °Case discussed with Dr. Aslam  at the time of the visit.  We reviewed the resident’s history and pertinent patient test results.  I agree with the assessment, diagnosis, and plan of care documented in the resident’s note.  °

## 2020-03-23 NOTE — Addendum Note (Signed)
Addended by: Lalla Brothers T on: 03/23/2020 08:32 AM   Modules accepted: Level of Service

## 2020-03-26 NOTE — Progress Notes (Signed)
Subjective: 49 year old female presents the office today for evaluation of chronic right ankle pain as well as her painful calluses to both feet as well as her right ankle pain.  She has been wearing a over-the-counter brace to the right ankle and she does follow walking which is been helpful.  Otherwise no new changes to her feet. Denies any fevers, chills, nausea, vomiting.  No calf pain, chest pain, shortness of breath.  Left A1c was 12.9 on 10/10/2019  She is upset as her mom is on hospice and does not have much longer to live with her brother which said the hospital due to Covid pneumonia and has dementia.  Objective: AAO x3, NAD DP/PT pulses palpable bilaterally, CRT less than 3 seconds The wounds well-healed on her feet.  Hyperkeratotic tissue present left medial second toe and first interspace right foot.  No underlying ulceration, drainage or any signs of infections are noted. No significant tenderness to the left hallux today. Mild swelling to the anterior medial aspect of the right ankle but there is no sign of pain today.  No other areas of pinpoint tenderness.  Mild crepitation with ankle joint range of motion. No pain with calf compression, swelling, warmth, erythema.    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 -Debrided hyperkeratotic lesions x2 without any complications or bleeding -Continue offloading. Moisturizer daily. -She is wearing flip-flops today.  Discussed supportive shoes. -Continue follow-up with neurology.  She is awaiting MRI pending insurance.  Return in about 4 weeks   [name] DPM

## 2020-04-05 ENCOUNTER — Other Ambulatory Visit: Payer: Self-pay | Admitting: Internal Medicine

## 2020-04-05 DIAGNOSIS — B3731 Acute candidiasis of vulva and vagina: Secondary | ICD-10-CM

## 2020-04-05 DIAGNOSIS — B9689 Other specified bacterial agents as the cause of diseases classified elsewhere: Secondary | ICD-10-CM

## 2020-04-05 MED ORDER — METRONIDAZOLE 500 MG PO TABS: 500.0000 mg | ORAL_TABLET | Freq: Two times a day (BID) | ORAL | 0 refills | Status: DC

## 2020-04-05 MED ORDER — FLUCONAZOLE 150 MG PO TABS: ORAL_TABLET | ORAL | 0 refills | Status: DC

## 2020-04-05 MED FILL — FLUCONAZOLE 150 MG TABS: 150 | 2 days supply | Qty: 2 | Fill #0

## 2020-04-05 MED FILL — metroNIDAZOLE 500 MG TABS: 500 | 7 days supply | Qty: 14 | Fill #0

## 2020-04-05 NOTE — Telephone Encounter (Signed)
Sounds reasonable. I see she has had prior wet prep + for BV and candida. Will treat empirically given similar symptoms. Please have her schedule follow up appointment if symptoms do not improve with treatment.

## 2020-04-05 NOTE — Telephone Encounter (Signed)
RTC to patient:  C/O ongoing vaginal burning, itching, with white discharge and some odor.  Pt had a telehealth visit in [facility] on 03/22/20, reported above symptoms X 3 days, was prescribed Diflucan 150mg  X2 per office note.  Pt did not come for an in person appt because she had Covid.  Pt states she has a hx of BV and yeast infections and in the past was treated with diflucan and flagyl.  She is requesting both RX's today. Pt's entire family had Covid.  She states her brother died 02/18/2023 of Covid pneumonia and his funeral is tomorrow.  She states her mother has Covid and is now home, on hospice care.  Will forward to PCP and attending pool for consideration.  Pt states she will make an office visit as soon as she can. SChaplin, RN,BSN

## 2020-04-05 NOTE — Telephone Encounter (Signed)
Pt requesting a phone call.  Pt still itching and burning  X 2 weeks and wants to know what other medications she can take.

## 2020-04-16 ENCOUNTER — Encounter: Payer: Self-pay | Admitting: Podiatry

## 2020-04-16 ENCOUNTER — Ambulatory Visit (INDEPENDENT_AMBULATORY_CARE_PROVIDER_SITE_OTHER): Payer: MEDICAID

## 2020-04-16 ENCOUNTER — Other Ambulatory Visit: Payer: Self-pay

## 2020-04-16 ENCOUNTER — Ambulatory Visit (INDEPENDENT_AMBULATORY_CARE_PROVIDER_SITE_OTHER): Payer: Self-pay | Admitting: Podiatry

## 2020-04-16 DIAGNOSIS — G629 Polyneuropathy, unspecified: Secondary | ICD-10-CM

## 2020-04-16 DIAGNOSIS — Q828 Other specified congenital malformations of skin: Secondary | ICD-10-CM

## 2020-04-16 DIAGNOSIS — M779 Enthesopathy, unspecified: Secondary | ICD-10-CM

## 2020-04-16 DIAGNOSIS — M19071 Primary osteoarthritis, right ankle and foot: Secondary | ICD-10-CM

## 2020-04-16 DIAGNOSIS — M7751 Other enthesopathy of right foot: Secondary | ICD-10-CM

## 2020-04-16 MED ORDER — DICLOFENAC SODIUM 1 % EX GEL: 2.0000 g | Freq: Four times a day (QID) | CUTANEOUS | 2 refills | Status: DC

## 2020-04-16 MED FILL — DICLOFENAC SODIUM 1 % GEL: 1 | 12 days supply | Qty: 100 | Fill #0

## 2020-04-17 NOTE — Progress Notes (Signed)
Subjective: 49 year old female presents the office today for evaluation of chronic right ankle pain as well as her painful calluses to both feet as well as her right ankle pain.  Following the event she said she is having some discomfort and swelling to the right first MPJ.  Recent injury or falls no change in activity level.  She is wearing an ankle brace on the right side but still wearing a flip-flop type of shoe.   Denies any fevers, chills, nausea, vomiting.  No calf pain, chest pain, shortness of breath.  Left A1c was 12.9 on 10/10/2019  Her brother recently passed away in [place] from hospice.  She also was recently treated her brother's house and she is going to move.  Objective: AAO x3, NAD DP/PT pulses palpable bilaterally, CRT less than 3 seconds The wounds well-healed on her feet.  Hyperkeratotic tissue present left medial second toe and first interspace right foot.  No underlying ulceration, drainage or any signs of infections are noted. No significant tenderness to the left hallux today. Denies any discomfort of the ankle today there is no significant swelling.  There is mild swelling and tenderness of the first MPJ on the right side. No pain with calf compression, swelling, warmth, erythema.    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 -X-rays obtained reviewed.  No evidence of acute fracture but arthritic changes are present the first MPJ.  Significant flatfoot as well as arthritic changes noted to the foot.  Previous triple arthrodesis. -Steroid injection.  Prescribed Voltaren gel the right first MPJ but she also uses on ankle as needed.  Continue ankle brace.  Recommended to continue with supportive shoes. -Debrided hyperkeratotic lesions x2 without any complications or  bleeding -Continue offloading. Moisturizer daily.. -Continue follow-up with neurology.  She is awaiting MRI pending insurance.  Return in about 4 weeks   [name] DPM

## 2020-05-02 ENCOUNTER — Ambulatory Visit: Payer: Self-pay | Admitting: *Deleted

## 2020-05-09 ENCOUNTER — Other Ambulatory Visit: Payer: Self-pay | Admitting: Internal Medicine

## 2020-05-09 DIAGNOSIS — M79671 Pain in right foot: Secondary | ICD-10-CM

## 2020-05-09 DIAGNOSIS — M79672 Pain in left foot: Secondary | ICD-10-CM

## 2020-05-09 MED FILL — ENALAPRIL MALEATE 20 MG TAB: 20 | 30 days supply | Qty: 30 | Fill #8

## 2020-05-09 MED FILL — JANUMET 50-1,000 MG TABLET: 50-1000 | 30 days supply | Qty: 60 | Fill #3

## 2020-05-09 MED FILL — LANTUS SOLOSTAR 100 UNITS/M: 100 | 25 days supply | Qty: 9 | Fill #6

## 2020-05-14 ENCOUNTER — Encounter: Payer: Self-pay | Admitting: Podiatry

## 2020-05-14 ENCOUNTER — Other Ambulatory Visit: Payer: Self-pay

## 2020-05-14 ENCOUNTER — Ambulatory Visit (INDEPENDENT_AMBULATORY_CARE_PROVIDER_SITE_OTHER): Payer: Self-pay | Admitting: Podiatry

## 2020-05-14 DIAGNOSIS — M19071 Primary osteoarthritis, right ankle and foot: Secondary | ICD-10-CM

## 2020-05-14 DIAGNOSIS — L84 Corns and callosities: Secondary | ICD-10-CM

## 2020-05-14 DIAGNOSIS — M779 Enthesopathy, unspecified: Secondary | ICD-10-CM

## 2020-05-14 DIAGNOSIS — G629 Polyneuropathy, unspecified: Secondary | ICD-10-CM

## 2020-05-15 ENCOUNTER — Other Ambulatory Visit: Payer: Self-pay | Admitting: Internal Medicine

## 2020-05-15 DIAGNOSIS — E1165 Type 2 diabetes mellitus with hyperglycemia: Secondary | ICD-10-CM

## 2020-05-15 NOTE — Progress Notes (Signed)
Subjective: 49 year old female presents the office today for evaluation of chronic right ankle pain as well as her painful calluses to both feet as well as her right ankle pain.  She states that she still getting some swelling and pain on the right foot she points to the first MPJ.  No recent injury or fall since I last saw her.  Unfortunately her Medicaid was denied and she is upset and frustrated by this.  Denies any fevers, chills, nausea, vomiting.  No calf pain, chest pain, shortness of breath.  Left A1c was 12.9 on 10/10/2019  Objective: AAO x3, NAD DP/PT pulses palpable bilaterally, CRT less than 3 seconds The wounds well-healed on her feet.  Hyperkeratotic tissue present left medial second toe and first interspace right foot.  No underlying ulceration, drainage or any signs of infections are noted. Denies any discomfort of the ankle today there is no significant swelling.  There is mild swelling and tenderness of the first MPJ on the right side still presents today. No pain with calf compression, swelling, warmth, erythema.    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 -She has a cam boot at home and I want her to wear this for the right side.  Encouraged to stay off the foot, ice elevation. -Debrided hyperkeratotic lesions x2 without any complications or bleeding -Continue offloading. Moisturizer daily.. -Continue follow-up with neurology.  She is awaiting MRI pending insurance.  Return in about 4 weeks   [name] DPM

## 2020-05-17 MED FILL — DICLOFENAC SODIUM 1 % GEL: 1 | 12 days supply | Qty: 100 | Fill #0

## 2020-05-18 ENCOUNTER — Telehealth: Payer: Self-pay | Admitting: Internal Medicine

## 2020-05-18 ENCOUNTER — Other Ambulatory Visit: Payer: Self-pay | Admitting: Student

## 2020-05-18 DIAGNOSIS — M79671 Pain in right foot: Secondary | ICD-10-CM

## 2020-05-18 DIAGNOSIS — M79672 Pain in left foot: Secondary | ICD-10-CM

## 2020-05-18 DIAGNOSIS — E1165 Type 2 diabetes mellitus with hyperglycemia: Secondary | ICD-10-CM

## 2020-05-18 MED ORDER — NOVOLOG FLEXPEN 100 UNIT/ML ~~LOC~~ SOPN: PEN_INJECTOR | SUBCUTANEOUS | 3 refills | Status: DC

## 2020-05-18 MED ORDER — IBUPROFEN 800 MG PO TABS: 800.0000 mg | ORAL_TABLET | Freq: Three times a day (TID) | ORAL | 0 refills | Status: DC | PRN

## 2020-05-18 MED FILL — IBUPROFEN 800 MG TABS: 800 | 13 days supply | Qty: 40 | Fill #0

## 2020-05-18 MED FILL — NOVOLOG FLEXPEN SYRINGE: 100 | 20 days supply | Qty: 3 | Fill #0

## 2020-05-18 NOTE — Telephone Encounter (Signed)
RTC to patient.  States she was receiving her RX's through the IM program and when she went to p/u her Novolog and Ibuprofen and [facility] yesterday, they were ringing up full price (over $100) and she cannot afford this.  Pt states she has no coverage.  Please resend Novolog and Ibuprofen RX's and note on RX  "IM Program" if MD agrees.  Also, patient has been on Depo-Provera 150mg  Q 3 months and receives this in [facility] clinic.   She received the new formulated  Depo injection (104MG ) in this clinic on 05/02/20.  She is c/o vaginal bleeding and abdominal/back pain which she describes as similar to when she had menses.  She states she has not had any menses on Depo 150mg .  RN suggested she might be having breakthrough bleeding since and if bleeding/cramping continues or worsens, to call back.  Will forward to MD for agreement or any further recommendations. SChaplin, RN,BSN

## 2020-05-18 NOTE — Telephone Encounter (Signed)
Pt requesting nurse to callback [phone]

## 2020-06-06 MED FILL — LANTUS SOLOSTAR 100 UNITS/M: 100 | 25 days supply | Qty: 9 | Fill #7

## 2020-06-06 MED FILL — IBUPROFEN 800 MG TABS: 800 | 13 days supply | Qty: 40 | Fill #0

## 2020-06-06 MED FILL — NOVOLOG FLEXPEN SYRINGE: 100 | 20 days supply | Qty: 3 | Fill #0

## 2020-06-06 MED FILL — ATORVASTATIN 40 MG TABLET: 40 | 30 days supply | Qty: 30 | Fill #5

## 2020-06-18 ENCOUNTER — Ambulatory Visit (INDEPENDENT_AMBULATORY_CARE_PROVIDER_SITE_OTHER): Payer: Self-pay | Admitting: Podiatry

## 2020-06-18 ENCOUNTER — Other Ambulatory Visit: Payer: Self-pay

## 2020-06-18 DIAGNOSIS — G629 Polyneuropathy, unspecified: Secondary | ICD-10-CM

## 2020-06-18 DIAGNOSIS — D492 Neoplasm of unspecified behavior of bone, soft tissue, and skin: Secondary | ICD-10-CM

## 2020-06-18 DIAGNOSIS — M19071 Primary osteoarthritis, right ankle and foot: Secondary | ICD-10-CM

## 2020-06-18 DIAGNOSIS — E1149 Type 2 diabetes mellitus with other diabetic neurological complication: Secondary | ICD-10-CM

## 2020-06-18 NOTE — Progress Notes (Signed)
Subjective: 49 year old female presents the office today for evaluation of chronic right ankle pain as well as her painful calluses to both feet as well as her right ankle pain.  No acute changes otherwise.  She has been wearing the [place] brace from the house which does help a lot of walking. Denies any fevers, chills, nausea, vomiting.  No calf pain, chest pain, shortness of breath.  Left A1c was 12.9 on 10/10/2019  Objective: AAO x3, NAD DP/PT pulses palpable bilaterally, CRT less than 3 seconds The wounds well-healed on her feet.  Hyperkeratotic tissue present left medial second toe and first interspace right foot.  No ulceration identified.  There is no erythema.  There is mild chronic edema to the right ankle tenderness along the anterior ankle joint line.  No other areas of discomfort.  MMT 5/5.  Flatfoot is present bilaterally.  Significant bunion is present. No pain with calf compression, swelling, warmth, erythema.    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 -Debrided hyperkeratotic lesions x2 without any complications or bleeding. -Continue [place] brace. -Medicaid seems to be approved.  Open option of the MRI of the back as well as other treatments.  Return in about 4 weeks   [name] DPM

## 2020-07-03 ENCOUNTER — Other Ambulatory Visit (HOSPITAL_COMMUNITY): Payer: Self-pay | Admitting: Student

## 2020-07-03 ENCOUNTER — Other Ambulatory Visit: Payer: Self-pay | Admitting: Student

## 2020-07-03 DIAGNOSIS — I10 Essential (primary) hypertension: Secondary | ICD-10-CM

## 2020-07-03 MED FILL — ENALAPRIL MALEATE 20 MG TAB: 20 | 30 days supply | Qty: 30 | Fill #0

## 2020-07-03 NOTE — Telephone Encounter (Signed)
Refill request  enalapril (VASOTEC) 20 MG tablet [[id number]]  [facility] - [place], [place] - [address].

## 2020-07-23 ENCOUNTER — Other Ambulatory Visit: Payer: Self-pay

## 2020-07-23 ENCOUNTER — Ambulatory Visit (INDEPENDENT_AMBULATORY_CARE_PROVIDER_SITE_OTHER): Payer: Self-pay | Admitting: Podiatry

## 2020-07-23 DIAGNOSIS — M5116 Intervertebral disc disorders with radiculopathy, lumbar region: Secondary | ICD-10-CM

## 2020-07-23 DIAGNOSIS — L84 Corns and callosities: Secondary | ICD-10-CM

## 2020-07-23 DIAGNOSIS — G629 Polyneuropathy, unspecified: Secondary | ICD-10-CM

## 2020-07-23 DIAGNOSIS — E1149 Type 2 diabetes mellitus with other diabetic neurological complication: Secondary | ICD-10-CM

## 2020-07-23 DIAGNOSIS — M19071 Primary osteoarthritis, right ankle and foot: Secondary | ICD-10-CM

## 2020-07-23 MED ORDER — GABAPENTIN 800 MG PO TABS: 800.0000 mg | ORAL_TABLET | Freq: Three times a day (TID) | ORAL | 3 refills | Status: DC

## 2020-07-23 MED FILL — LANTUS SOLOSTAR 100 UNITS/M: 100 | 25 days supply | Qty: 9 | Fill #8

## 2020-07-23 MED FILL — NOVOLOG FLEXPEN SYRINGE: 100 | 20 days supply | Qty: 3 | Fill #1

## 2020-07-23 NOTE — Patient Instructions (Signed)

## 2020-07-25 ENCOUNTER — Telehealth: Payer: Self-pay

## 2020-07-25 NOTE — Telephone Encounter (Signed)
Pt is requesting that [facility] send script for gabapentin that was recently prescribed by her podiatrist. Her reason for asking [place] to fill is that she is unable to afford it as she is uninsured and that we can use the [facility] program to assist.  I reviewed her PMH and prior imaging of her back. Prior imaging done in 2005 indicated moderate sized right paracentral disk protrusion involving the L5-S1 disk, contacting the right S1 nerve root. That imaging also indicated some less severe disk bulging present at other locations. I reviewed last renal function panel which was obtained in 2020 which showed normal renal function.  Plan: will ask that patient arrange an appointment with our clinic for evaluation prior to prescribing.

## 2020-07-25 NOTE — Telephone Encounter (Signed)
RTC, patient states she had an appt with her Podiatrist, Dr. Jacqualyn Posey who prescribed Gabapentin for pain/burning in her feet and ankles.  She states she has taken this before and [facility] was prescribing this for her at [facility] via IM Program.  She cannot afford this medication unless she receives it on the IM program.  She is wanting to know if [facility] MD will represcribe this at [facility] IM program.  She states if she needs to come in for an appt, she will do this. Sending to [facility] team. Laurence Compton, RN,BSN

## 2020-07-25 NOTE — Progress Notes (Signed)
Subjective: 49 year old female presents the office today for evaluation of chronic right ankle pain as well as her painful calluses to both feet as well as her right ankle pain.  Pain to the ankles been intermittent.  The majority pain is to the calluses.  She wears an [place] brace which is on her feet a lot otherwise she is wearing a Tri-Lock ankle brace which does help.  She is still working on trying to get Medicaid approved.  There have been some back-and-forth issues with this but she is frustrated with.  Neurologist previously recommended MRI however she is unable to do this because of insurance issues.  She is off gabapentin now due to issues with getting this medication refill and cost.  It was helpful when she was taking this.  Left A1c was 12.9 on 10/10/2019  Objective: AAO x3, NAD DP/PT pulses palpable bilaterally, CRT less than 3 seconds Hyperkeratotic tissue present left medial second toe and first interspace right foot.  No ulceration identified.  There is no erythema.  Chronic edema present although mild to the ankle joint.  Mild diffuse tenderness of the anterior ankle joint line.  No other areas of discomfort.  MMT 5/5.  Flatfoot is present bilaterally.  Significant bunion is present. No pain with calf compression, swelling, warmth, erythema.    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 -Debrided hyperkeratotic lesions x2 without any complications or bleeding. -Continue [place] brace/ankle brace for now -Refill gabapentin.  Her to follow-up with neurology as well.  Awaiting insurance approval so she can have a new MRI.  Return in about 4 weeks   [name] DPM

## 2020-07-25 NOTE — Telephone Encounter (Signed)
Please have patient arrange an appointment. Will prescribe once we have evaluated.

## 2020-07-25 NOTE — Telephone Encounter (Signed)
Forwarding to front desk pool to assist patient in scheduling appt.  RN already informed patient she may need to be seen at [facility] for evaluation before RX refills. Thank you! Nadim Malia

## 2020-07-25 NOTE — Telephone Encounter (Signed)
Please call pt back about meds.  

## 2020-07-29 NOTE — Progress Notes (Signed)
Office Visit   [name], female    DOB: 12-04-1970, 49 y.o.   MRN: [id number]  Subjective:  CC: hypertension, T2DM, neuropathy    HPI 49 y.o. presents today for follow up of her chronic medical conditions.  Pt is requesting that [facility] send script for gabapentin that was recently prescribed by her podiatrist. Her reason for asking [place] to fill is that she is unable to afford it as she is uninsured and that we can use the [facility] program to assist. I reviewed her PMH and prior imaging of her back. Prior imaging done in 2005 indicated moderate sized right paracentral disk protrusion involving the L5-S1 disk, contacting the right S1 nerve root. That imaging also indicated some less severe disk bulging present at other locations. I reviewed last renal function panel which was obtained in 2020 which showed normal renal function  Today, she is noting nerve pain running down her left leg. She denies any recent changes to this. Denies red flag symptoms.   She also relays that she has been under a high level of stress in the setting of her very ill mother. She has chronic anxiety and was previously seeing Dessie Coma. Today, she is inquiring into seeking out another counselor.    ACTIVE MEDICATIONS   Current Outpatient Medications on File Prior to Visit  Medication Sig Dispense Refill  . albuterol (PROVENTIL HFA;VENTOLIN HFA) 108 (90 Base) MCG/ACT inhaler Inhale 1-2 puffs into the lungs every 6 (six) hours as needed for wheezing or shortness of breath. 1 Inhaler 0  . atorvastatin (LIPITOR) 40 MG tablet Take 1 tablet (40 mg total) by mouth daily. 30 tablet 11  . Blood Glucose Monitoring Suppl (CONTOUR NEXT ONE) KIT 1 each by Does not apply route See admin instructions. USE TO CHECK BLOOD GLUCOSE ONCE DAILY. IM PROGRAM. 1 kit 0  . busPIRone (BUSPAR) 7.5 MG tablet Take 1 tablet by mouth 3 (three) times daily.    . cyclobenzaprine (FEXMID) 7.5 MG tablet Take 1 tablet (7.5  mg total) by mouth 3 (three) times daily as needed for muscle spasms. 90 tablet 0  . enalapril (VASOTEC) 20 MG tablet TAKE 1 TABLET (20 MG TOTAL) BY MOUTH DAILY. 90 tablet 3  . glucose blood (CONTOUR NEXT TEST) test strip Please use to check your sugar 3 times a day. 100 strip 3  . hydrOXYzine (ATARAX/VISTARIL) 25 MG tablet Take 25 mg by mouth 3 (three) times daily as needed.    Marland Kitchen ibuprofen (ADVIL) 800 MG tablet Take 1 tablet (800 mg total) by mouth every 8 (eight) hours as needed. 40 tablet 0  . insulin aspart (NOVOLOG FLEXPEN) 100 UNIT/ML FlexPen INJECT 5 UNITS INTO THE SKIN 3 TIMES DAILY WITH MEALS. 3 mL 3  . Insulin Glargine (LANTUS SOLOSTAR) 100 UNIT/ML Solostar Pen Inject 35 Units into the skin daily at 10 pm. 5 pen 6  . Insulin Pen Needle (UNIFINE PENTIPS) 31G X 5 MM MISC Inject 1 Units into the skin 3 (three) times daily. Use to inject insulin 4 times daily. IM PROGRAM 130 each 5  . lamoTRIgine (LAMICTAL) 100 MG tablet Take 100 mg by mouth 2 (two) times daily.     . Lancets MISC 1 Units by Does not apply route 3 (three) times daily. 100 each 3  . meloxicam (MOBIC) 15 MG tablet Take 1 tablet (15 mg total) by mouth daily. 30 tablet 0  . Multiple Vitamins-Minerals (MULTIVITAMIN WITH MINERALS) tablet Take 1 tablet by mouth  daily.      . nystatin (NYSTATIN) powder Apply topically 4 (four) times daily. 15 g 3  . sertraline (ZOLOFT) 100 MG tablet Take 100 mg by mouth daily.    . sitaGLIPtin-metformin (JANUMET) 50-1000 MG tablet TAKE 1 TABLET BY MOUTH 2 TIMES DAILY WITH A MEAL. 60 tablet 12  . traZODone (DESYREL) 50 MG tablet Take 50 mg by mouth at bedtime.     No current facility-administered medications on file prior to visit.    ROS  Review of Systems  Constitutional: Negative for chills and fever.  Cardiovascular: Negative for chest pain.  Musculoskeletal: Positive for arthralgias, back pain and gait problem.  Neurological: Negative for dizziness, weakness, light-headedness, numbness and  headaches.  Psychiatric/Behavioral: The patient is nervous/anxious.     Objective:   BP (!) 146/64 (BP Location: Left Arm, Patient Position: Sitting, Cuff Size: Normal)   Pulse 86   Temp 99 F (37.2 C) (Oral)   Wt 258 lb 11.2 oz (117.3 kg)   SpO2 100%   BMI 31.49 kg/m  Wt Readings from Last 3 Encounters:  07/30/20 258 lb 11.2 oz (117.3 kg)  10/10/19 266 lb 9.6 oz (120.9 kg)  08/15/19 281 lb (127.5 kg)   BP Readings from Last 3 Encounters:  07/30/20 (!) 146/64  10/10/19 137/72  08/15/19 140/90   Physical Exam Constitutional:      Appearance: Normal appearance.  Cardiovascular:     Rate and Rhythm: Normal rate.  Pulmonary:     Effort: Pulmonary effort is normal.  Musculoskeletal:     Comments: Pain with back flexion with pain radiation down the left leg. Strength intact. Sensation intact.     Health Maintenance:   Health Maintenance  Topic Date Due  . PNEUMOCOCCAL POLYSACCHARIDE VACCINE AGE 56-64 HIGH RISK  Never done  . COVID-19 Vaccine (1) Never done  . PAP SMEAR-Modifier  07/03/2014  . COLONOSCOPY  01/11/2018  . MAMMOGRAM  Never done  . FOOT EXAM  01/07/2020  . LIPID PANEL  01/07/2020  . OPHTHALMOLOGY EXAM  01/07/2020  . INFLUENZA VACCINE  06/03/2020  . HEMOGLOBIN A1C  10/29/2020  . TETANUS/TDAP  08/29/2026  . Hepatitis C Screening  Completed  . HIV Screening  Completed     Assessment & Plan:   Problem List Items Addressed This Visit      Cardiovascular and Mediastinum   Hypertension (Chronic)    Blood pressure is above goal in the clinic today. Patient reports complaince with her medication. Encouraged her to arrange a separate office visit to address this and diabetes as today's visit was for an acute issue        Endocrine   Uncontrolled type 2 diabetes mellitus ([place]) - Primary    A1C >14 today. Pt reports medication compliance but is not controlling her diet. Pt instructed to arrange a separate appt to discuss uncontrolled DM and HTN as today's  OV was for an acute concern.      Relevant Orders   POC Hbg A1C (Completed)   RESOLVED: T2DM (type 2 diabetes mellitus) ([place])     Nervous and Auditory   Lumbar disc herniation with radiculopathy    Pt is presenting to the office today for this chronic issue. Podiatry recently sent this to the pharmacy however, since patient is currently uninsured, she was unable to afford it. She is seeking [facility] provider to send this to [facility] through the [facility] program. No red flag s/s on exam. No significant changes in pain in recent  months. Pain previously improved with gabapentin 871m three times daily. Plan --start gabapentin 3029m3x daily. May increase to 6008mx daily after 1w.  --discussed precautions while taking this --f/u in 3 months for re-evaluation      Relevant Medications   gabapentin (NEURONTIN) 300 MG capsule     Other   Depression    Referral placed to integrated behavior health for her to establish with a new counselor.      Relevant Orders   [place]   Financial difficulties   Relevant Orders   Ambulatory referral to Chronic Care Management Services        Pt discussed with Dr. WilMarty HeckD Internal Medicine Resident PGY-2 [facility] Pager: #[phone] 5:55 PM

## 2020-07-30 ENCOUNTER — Other Ambulatory Visit (HOSPITAL_COMMUNITY): Payer: Self-pay | Admitting: Internal Medicine

## 2020-07-30 ENCOUNTER — Encounter: Payer: Self-pay | Admitting: *Deleted

## 2020-07-30 ENCOUNTER — Ambulatory Visit (INDEPENDENT_AMBULATORY_CARE_PROVIDER_SITE_OTHER): Payer: Self-pay | Admitting: Internal Medicine

## 2020-07-30 ENCOUNTER — Encounter: Payer: Self-pay | Admitting: Internal Medicine

## 2020-07-30 ENCOUNTER — Other Ambulatory Visit: Payer: Self-pay

## 2020-07-30 VITALS — BP 146/64 | HR 86 | Temp 99.0°F | Wt 258.7 lb

## 2020-07-30 DIAGNOSIS — E1165 Type 2 diabetes mellitus with hyperglycemia: Secondary | ICD-10-CM

## 2020-07-30 DIAGNOSIS — Z599 Problem related to housing and economic circumstances, unspecified: Secondary | ICD-10-CM

## 2020-07-30 DIAGNOSIS — E1169 Type 2 diabetes mellitus with other specified complication: Secondary | ICD-10-CM

## 2020-07-30 DIAGNOSIS — M5116 Intervertebral disc disorders with radiculopathy, lumbar region: Secondary | ICD-10-CM

## 2020-07-30 DIAGNOSIS — I10 Essential (primary) hypertension: Secondary | ICD-10-CM

## 2020-07-30 DIAGNOSIS — F32A Depression, unspecified: Secondary | ICD-10-CM

## 2020-07-30 DIAGNOSIS — F329 Major depressive disorder, single episode, unspecified: Secondary | ICD-10-CM

## 2020-07-30 DIAGNOSIS — Z598 Other problems related to housing and economic circumstances: Secondary | ICD-10-CM

## 2020-07-30 DIAGNOSIS — Z794 Long term (current) use of insulin: Secondary | ICD-10-CM

## 2020-07-30 LAB — POCT GLYCOSYLATED HEMOGLOBIN (HGB A1C): HbA1c POC (<> result, manual entry): >14 % — AB (ref 4.0–5.6)

## 2020-07-30 LAB — GLUCOSE, CAPILLARY: Glucose-Capillary: 404 mg/dL — ABNORMAL HIGH (ref 70–99)

## 2020-07-30 MED ORDER — GABAPENTIN 300 MG PO CAPS: ORAL_CAPSULE | ORAL | 0 refills | Status: DC

## 2020-07-30 MED FILL — GABAPENTIN 300 MG CAPSULE: 300 | 30 days supply | Qty: 180 | Fill #0

## 2020-07-30 NOTE — Assessment & Plan Note (Signed)
Referral placed to integrated behavior health for her to establish with a new counselor.

## 2020-07-30 NOTE — Assessment & Plan Note (Deleted)
A1C >14 today. Pt reports medication compliance but is not controlling her diet. Pt instructed to arrange a separate appt to discuss uncontrolled DM and HTN as today's OV was for an acute concern.

## 2020-07-30 NOTE — Assessment & Plan Note (Signed)
Blood pressure is above goal in the clinic today. Patient reports complaince with her medication. Encouraged her to arrange a separate office visit to address this and diabetes as today's visit was for an acute issue

## 2020-07-30 NOTE — Assessment & Plan Note (Signed)
A1C >14 today. Pt reports medication compliance but is not controlling her diet. Pt instructed to arrange a separate appt to discuss uncontrolled DM and HTN as today's OV was for an acute concern.

## 2020-07-30 NOTE — Assessment & Plan Note (Signed)
Pt is presenting to the office today for this chronic issue. Podiatry recently sent this to the pharmacy however, since patient is currently uninsured, she was unable to afford it. She is seeking [facility] provider to send this to [facility] through the [facility] program. No red flag s/s on exam. No significant changes in pain in recent months. Pain previously improved with gabapentin 800mg  three times daily. Plan --start gabapentin 300mg  3x daily. May increase to 600mg  3x daily after 1w.  --discussed precautions while taking this --f/u in 3 months for re-evaluation

## 2020-07-31 ENCOUNTER — Telehealth: Payer: Self-pay | Admitting: *Deleted

## 2020-07-31 MED FILL — ATORVASTATIN 40 MG TABLET: 40 | 30 days supply | Qty: 30 | Fill #6

## 2020-07-31 NOTE — Chronic Care Management (AMB) (Signed)
  Chronic Care Management   Note  07/31/2020 Name: Ladell Bey MRN: [id number] DOB: 25-Aug-1971  Jodi Marble Dillenbeck is a 49 y.o. year old female who is a primary care patient of [name], MD. I reached out to [employer] by phone today in response to a referral sent by Ms. Jodi Marble Rode's PCP, [name], MD.       A telephone outreach was made today spoke to patient she is in need of assistance with obtaining medications routed referral to [employer] [facility] to see if they could assist.     [place] [address] [place] Management  Direct Dial: [phone]

## 2020-08-03 ENCOUNTER — Other Ambulatory Visit: Payer: Self-pay

## 2020-08-03 NOTE — Patient Outreach (Signed)
Care Coordination- Social Work  08/03/2020  Tina [name] 06-Nov-1970 [id number]  Subjective:    Tina [name] is an 49 y.o. year old female who is a primary patient of [name], MD.    Tina [name] was given information about Medicaid Managed Care team care coordination services today. Jodi Marble Caisse agreed to services and verbal consent obtained  Review of patient status, laboratory and other test data was performed as part of evaluation for provision of services.  SDOH:   SDOH Screenings   Alcohol Screen: Low Risk   . Last Alcohol Screening Score (AUDIT): 1  Depression (PHQ2-9): Medium Risk  . PHQ-2 Score: 16  Financial Resource Strain: Medium Risk  . Difficulty of Paying Living Expenses: Somewhat hard  Food Insecurity: No Food Insecurity  . Worried About Charity fundraiser in the Last Year: Never true  . Ran Out of Food in the Last Year: Never true  Housing: Low Risk   . Last Housing Risk Score: 0  Physical Activity: Inactive  . Days of Exercise per Week: 0 days  . Minutes of Exercise per Session: 0 min  Social Connections: Moderately Isolated  . Frequency of Communication with Friends and Family: More than three times a week  . Frequency of Social Gatherings with Friends and Family: More than three times a week  . Attends Religious Services: More than 4 times per year  . Active Member of Clubs or Organizations: No  . Attends Archivist Meetings: Never  . Marital Status: Never married  Stress: Stress Concern Present  . Feeling of Stress : Very much  Tobacco Use: High Risk  . Smoking Tobacco Use: Current Every Day Smoker  . Smokeless Tobacco Use: Never Used  Transportation Needs: No Transportation Needs  . Lack of Transportation (Medical): No  . Lack of Transportation (Non-Medical): No     Objective: to assess for patient needs   Medications:  Medications Reviewed Today    Reviewed by [name], LCSW (Social Worker) on 08/03/20 at [phone]  Med List Status: <None>  Medication Order Taking? Sig Documenting Provider Last Dose Status Informant  albuterol (PROVENTIL HFA;VENTOLIN HFA) 108 (90 Base) MCG/ACT inhaler [id number] Yes Inhale 1-2 puffs into the lungs every 6 (six) hours as needed for wheezing or shortness of breath. Petrucelli, [name], PA-C Taking Active   atorvastatin (LIPITOR) 40 MG tablet [id number] Yes Take 1 tablet (40 mg total) by mouth daily. Welford Roche, MD Taking Active   Blood Glucose Monitoring Suppl (CONTOUR NEXT ONE) KIT [id number] Yes 1 each by Does not apply route See admin instructions. USE TO CHECK BLOOD GLUCOSE ONCE DAILY. IM PROGRAM. [name], MD Taking Active   busPIRone (BUSPAR) 7.5 MG tablet [id number] Yes Take 1 tablet by mouth 3 (three) times daily. [provider] Taking Active   cyclobenzaprine (FEXMID) 7.5 MG tablet [id number] Yes Take 1 tablet (7.5 mg total) by mouth 3 (three) times daily as needed for muscle spasms. Marcelyn Bruins, MD Taking Active   enalapril (VASOTEC) 20 MG tablet [id number] Yes TAKE 1 TABLET (20 MG TOTAL) BY MOUTH DAILY. Andrew Au, MD Taking Active   gabapentin (NEURONTIN) 300 MG capsule [id number] Yes Take 1 capsule (300 mg total) by mouth 3 (three) times daily for 3 days, THEN 2 capsules (600 mg total) 3 (three) times daily. Mitzi Hansen, MD Taking Active   glucose blood (CONTOUR NEXT TEST) test strip [id number] Yes Please use to check your sugar 3 times a  day. Welford Roche, MD Taking Active   hydrOXYzine (ATARAX/VISTARIL) 25 MG tablet [id number] Yes Take 25 mg by mouth 3 (three) times daily as needed. [provider] Taking Active   ibuprofen (ADVIL) 800 MG tablet [id number] Yes Take 1 tablet (800 mg total) by mouth every 8 (eight) hours as needed. [name], MD Taking Active   insulin aspart (NOVOLOG FLEXPEN) 100 UNIT/ML FlexPen [id number] Yes INJECT 5 UNITS INTO THE SKIN 3  TIMES DAILY WITH MEALS. [name], MD Taking Active   Insulin Glargine (LANTUS SOLOSTAR) 100 UNIT/ML Solostar Pen [id number] Yes Inject 35 Units into the skin daily at 10 pm. Welford Roche, MD Taking Active   Insulin Pen Needle (UNIFINE PENTIPS) 31G X 5 MM MISC [id number] Yes Inject 1 Units into the skin 3 (three) times daily. Use to inject insulin 4 times daily. IM PROGRAM Welford Roche, MD Taking Active   lamoTRIgine (LAMICTAL) 100 MG tablet [id number] Yes Take 100 mg by mouth 2 (two) times daily.  [provider] Taking Active   Lancets MISC [id number] Yes 1 Units by Does not apply route 3 (three) times daily. Welford Roche, MD Taking Active   meloxicam (MOBIC) 15 MG tablet [id number] No Take 1 tablet (15 mg total) by mouth daily.  Patient not taking: Reported on 08/03/2020   [name], DPM Not Taking Active   Multiple Vitamins-Minerals (MULTIVITAMIN WITH MINERALS) tablet [id number] Yes Take 1 tablet by mouth daily.   [provider] Taking Active   nystatin (NYSTATIN) powder [id number]  Apply topically 4 (four) times daily. Welford Roche, MD  Active   sertraline (ZOLOFT) 100 MG tablet [id number] Yes Take 100 mg by mouth daily. [provider] Taking Active   sitaGLIPtin-metformin (JANUMET) 50-1000 MG tablet [id number] Yes TAKE 1 TABLET BY MOUTH 2 TIMES DAILY WITH A MEAL. Welford Roche, MD Taking Active   traZODone (DESYREL) 50 MG tablet [id number] Yes Take 50 mg by mouth at bedtime. [provider] Taking Active           Fall/Depression Screening:  Fall Risk  08/03/2020 07/30/2020 10/10/2019  Falls in the past year? 1 0 0  Comment Patient stated she fell about 2 months ago. - -  Number falls in past yr: - - -  Comment - - -  Injury with Fall? - - -  Risk for fall due to : - Impaired balance/gait;Impaired mobility Impaired balance/gait;Impaired mobility  Follow up - Falls evaluation completed Falls  prevention discussed   PHQ 2/9 Scores 07/30/2020 10/10/2019 08/04/2019 04/14/2019 01/07/2019 04/13/2018 01/25/2018  PHQ - 2 Score _0 PHQ- 9 Score _1 - 9  Exception Documentation - - - - - - -    Assessment:  Goals Addressed              This Visit's Progress   .  "I need resources to help pay for my diabetes medications." (pt-stated)        CARE PLAN ENTRY Medicaid Managed Care (see longitudinal plan of care for additional care plan information)  Current Barriers:  . Community resources access barrier: Patient Lacks knowledge of community resource: to assist with medication obtaining medication such as the [employer]. . Medication procurement . Financial constraints related to unemployment and on disability and having no insurance.  Clinical Social Work Clinical Goal(s):  Marland Kitchen Over the next 7-14 days, patient will work with BSW to address concerns related to resources needed  Interventions: . Inter-disciplinary care team collaboration (see longitudinal plan of care) . Patient interviewed and appropriate assessments performed . Referred patient to community resources care guide team for assistance with resources to assist with patient getting her diabetes medications. . BSW will contact patient to discuss needs and available resources.  Patient Self Care Activities:  . Patient will work with the Managed Medicaid team. . Licensed Clinical Social Worker will refer to BSW to assist with resources.  Initial goal documentation     .  "I want to start outpatient therapy again." (pt-stated)        [place] (see longitudinal plan of care for additional care plan information)  Current Barriers:  . Patient needs assistance with appointment scheduling and follow up with [facility]. . Mental Health Concerns - Patient struggles with depression but hasn't received therapy since June due to  changes within the agency where she was receiving services at that time.  Clinical Social Work Clinical Goal(s):  Marland Kitchen Over the next 7-14 days, patient will work with LCSW to address needs related to managing stress. . Over the next 7-14 days, patient will work with BSW to schedule appointment at [facility]).  Interventions: . Inter-disciplinary care team collaboration (see longitudinal plan of care) . Patient interviewed and appropriate assessments performed . Referred patient to BSW for assistance with scheduling appointment for therapy and medication management . Collaborated with BSW re: scheduling appointment at [facility] - [phone].  Patient Self Care Activities:  . Patient will continue to take medications as prescribed until she can be seen at [place] will work with patient and provider to assist with scheduling therapy and medication management. Marland Kitchen LCSW will follow-up in 14 days.  Initial goal documentation        Plan:  The Managed Medicaid Care Team will reach out to patient again in 7-14 days. Appointment is scheduled for August 16, 2020 @ 1:00pm.  [name], BSW, MSW, [place]: [phone]

## 2020-08-03 NOTE — Patient Instructions (Addendum)
Visit Information [name], it was a pleasure speaking with you. RN Case Manager will reach out to you to discuss your diabetes and high blood pressure, and the Managed Medicaid BSW will reach out to you to discuss your appointments for therapy and medication management. Medication along with therapy will provide useful tools for you to better manage your depression.   Tina [name] was given information about Medicaid Managed Care team care coordination services and consented to engagement with the [facility] Managed Care team.   Goals Addressed              This Visit's Progress   .  "I need resources to help pay for my diabetes medications." (pt-stated)        CARE PLAN ENTRY Medicaid Managed Care (see longitudinal plan of care for additional care plan information)  Current Barriers:  . Community resources access barrier: Patient Lacks knowledge of community resource: to assist with medication obtaining medication such as the [employer]. . Medication procurement . Financial constraints related to unemployment and on disability and having no insurance.  Clinical Social Work Clinical Goal(s):  Marland Kitchen Over the next 7-14 days, patient will work with BSW to address concerns related to resources needed  Interventions: . Inter-disciplinary care team collaboration (see longitudinal plan of care) . Patient interviewed and appropriate assessments performed . Referred patient to community resources care guide team for assistance with resources to assist with patient getting her diabetes medications. . BSW will contact patient to discuss needs and available resources.  Patient Self Care Activities:  . Patient will work with the Managed Medicaid team. . Licensed Clinical Social Worker will refer to BSW to assist with resources.  Initial goal documentation     .  "I want to start outpatient therapy again." (pt-stated)        [place] (see  longitudinal plan of care for additional care plan information)  Current Barriers:  . Patient needs assistance with appointment scheduling and follow up with [facility]. . Mental Health Concerns - Patient struggles with depression but hasn't received therapy since June due to changes within the agency where she was receiving services at that time.  Clinical Social Work Clinical Goal(s):  Marland Kitchen Over the next 7-14 days, patient will work with LCSW to address needs related to managing stress. . Over the next 7-14 days, patient will work with BSW to schedule appointment at [facility]).  Interventions: . Inter-disciplinary care team collaboration (see longitudinal plan of care) . Patient interviewed and appropriate assessments performed . Referred patient to BSW for assistance with scheduling appointment for therapy and medication management . Collaborated with BSW re: scheduling appointment at [facility] - [phone].  Patient Self Care Activities:  . Patient will continue to take medications as prescribed until she can be seen at [place] will work with patient and provider to assist with scheduling therapy and medication management. Marland Kitchen LCSW will follow-up in 14 days.  Initial goal documentation        Please see education materials related to managing depression provided below.   Major Depressive Disorder, Adult Major depressive disorder (MDD) is a mental health condition. MDD often makes you feel sad, hopeless, or helpless. MDD can also cause symptoms in your body. MDD can affect your:  Work.  School.  Relationships.  Other normal activities. MDD can range from mild to very bad. It may occur once (single episode  MDD). It can also occur many times (recurrent MDD). The main symptoms of MDD often include:  Feeling sad, depressed, or irritable most of the time.  Loss of interest. MDD symptoms also include:  Sleeping too  much or too little.  Eating too much or too little.  A change in your weight.  Feeling tired (fatigue) or having low energy.  Feeling worthless.  Feeling guilty.  Trouble making decisions.  Trouble thinking clearly.  Thoughts of suicide or harming others.  Feeling weak.  Feeling agitated.  Keeping yourself from being around other people (isolation). Follow these instructions at home: Activity  Do these things as told by your doctor: ? Go back to your normal activities. ? Exercise regularly. ? Spend time outdoors. Alcohol  Talk with your doctor about how alcohol can affect your antidepressant medicines.  Do not drink alcohol. Or, limit how much alcohol you drink. ? This means no more than 1 drink a day for nonpregnant women and 2 drinks a day for men. One drink equals one of these:  12 oz of beer.  5 oz of wine.  1 oz of hard liquor. General instructions  Take over-the-counter and prescription medicines only as told by your doctor.  Eat a healthy diet.  Get plenty of sleep.  Find activities that you enjoy. Make time to do them.  Think about joining a support group. Your doctor may be able to suggest a group for you.  Keep all follow-up visits as told by your doctor. This is important. Where to find more information:  [employer] on Mental Illness: ? www.nami.[place]: ? https://carter.com/  National Suicide Prevention Lifeline: ? 516-294-5870. This is free, 24-hour help. Contact a doctor if:  Your symptoms get worse.  You have new symptoms. Get help right away if:  You self-harm.  You see, hear, taste, smell, or feel things that are not present (hallucinate). If you ever feel like you may hurt yourself or others, or have thoughts about taking your own life, get help right away. You can go to your nearest emergency department or call:  Your local emergency services (911 in the [place]  A suicide  crisis helpline, such as the National Suicide Prevention Lifeline: ? 743-128-7147. This is open 24 hours a day. This information is not intended to replace advice given to you by your health care provider. Make sure you discuss any questions you have with your health care provider. Document Revised: 10/02/2017 Document Reviewed: 07/06/2016 Elsevier Patient Education  2020 [employer].   Patient verbalizes understanding of instructions provided today.   Mental Health Provider appointment will be scheduled by BSW.  Telephone follow up appointment with Managed Medicaid LCSW is scheduled for: August 16, 2020 @ 1:00pm.  [name], BSW, MSW, [place]: [phone]

## 2020-08-08 ENCOUNTER — Other Ambulatory Visit (HOSPITAL_COMMUNITY): Payer: Self-pay | Admitting: Internal Medicine

## 2020-08-08 ENCOUNTER — Other Ambulatory Visit: Payer: Self-pay

## 2020-08-08 ENCOUNTER — Ambulatory Visit (INDEPENDENT_AMBULATORY_CARE_PROVIDER_SITE_OTHER): Payer: Self-pay | Admitting: Internal Medicine

## 2020-08-08 VITALS — BP 130/72 | HR 87 | Temp 98.4°F | Ht 76.0 in | Wt 259.5 lb

## 2020-08-08 DIAGNOSIS — D219 Benign neoplasm of connective and other soft tissue, unspecified: Secondary | ICD-10-CM

## 2020-08-08 DIAGNOSIS — Z3202 Encounter for pregnancy test, result negative: Secondary | ICD-10-CM

## 2020-08-08 DIAGNOSIS — I10 Essential (primary) hypertension: Secondary | ICD-10-CM

## 2020-08-08 DIAGNOSIS — E1165 Type 2 diabetes mellitus with hyperglycemia: Secondary | ICD-10-CM

## 2020-08-08 DIAGNOSIS — F32A Depression, unspecified: Secondary | ICD-10-CM

## 2020-08-08 LAB — POCT URINE PREGNANCY: Preg Test, Ur: NEGATIVE

## 2020-08-08 MED ORDER — MEDROXYPROGESTERONE ACETATE 104 MG/0.65ML ~~LOC~~ SUSY
104.0000 mg | PREFILLED_SYRINGE | Freq: Once | SUBCUTANEOUS | Status: AC
  Administered 2020-08-08: 104 mg via SUBCUTANEOUS

## 2020-08-08 MED ORDER — NOVOLOG FLEXPEN 100 UNIT/ML ~~LOC~~ SOPN: 10.0000 [IU] | PEN_INJECTOR | Freq: Three times a day (TID) | SUBCUTANEOUS | 3 refills | Status: DC

## 2020-08-08 MED ORDER — MEDROXYPROGESTERONE ACETATE 150 MG/ML IM SUSP: 150.0000 mg | Freq: Once | INTRAMUSCULAR | Status: DC

## 2020-08-08 MED ORDER — CANAGLIFLOZIN 100 MG PO TABS: 100.0000 mg | ORAL_TABLET | Freq: Every day | ORAL | 1 refills | Status: DC

## 2020-08-08 MED FILL — INVOKANA 100 MG TABLET: 100 | 30 days supply | Qty: 30 | Fill #0

## 2020-08-08 MED FILL — NOVOLOG FLEXPEN SYRINGE: 100 | 10 days supply | Qty: 3 | Fill #0

## 2020-08-08 NOTE — Patient Instructions (Addendum)
Thank you for allowing [place] to provide your care today. Today we discussed your diabetes and mood    I have ordered bmp, proteinuria screen and  labs for you. I will call if any are abnormal.    Today we made the following  changes to your medications.    Please start canagliflozin 100mg  daily Please increase your novolog to 10 units with meals  Please follow-up in 1 month.    Should you have any questions or concerns please call the internal medicine clinic at [phone].     Diabetes Mellitus and Nutrition, Adult When you have diabetes (diabetes mellitus), it is very important to have healthy eating habits because your blood sugar (glucose) levels are greatly affected by what you eat and drink. Eating healthy foods in the appropriate amounts, at about the same times every day, can help you:  Control your blood glucose.  Lower your risk of heart disease.  Improve your blood pressure.  Reach or maintain a healthy weight. Every person with diabetes is different, and each person has different needs for a meal plan. Your health care provider may recommend that you work with a diet and nutrition specialist (dietitian) to make a meal plan that is best for you. Your meal plan may vary depending on factors such as:  The calories you need.  The medicines you take.  Your weight.  Your blood glucose, blood pressure, and cholesterol levels.  Your activity level.  Other health conditions you have, such as heart or kidney disease. How do carbohydrates affect me? Carbohydrates, also called carbs, affect your blood glucose level more than any other type of food. Eating carbs naturally raises the amount of glucose in your blood. Carb counting is a method for keeping track of how many carbs you eat. Counting carbs is important to keep your blood glucose at a healthy level, especially if you use insulin or take certain oral diabetes medicines. It is important to know how many carbs you can  safely have in each meal. This is different for every person. Your dietitian can help you calculate how many carbs you should have at each meal and for each snack. Foods that contain carbs include:  Bread, cereal, rice, pasta, and crackers.  Potatoes and corn.  Peas, beans, and lentils.  Milk and yogurt.  Fruit and juice.  Desserts, such as cakes, cookies, ice cream, and candy. How does alcohol affect me? Alcohol can cause a sudden decrease in blood glucose (hypoglycemia), especially if you use insulin or take certain oral diabetes medicines. Hypoglycemia can be a life-threatening condition. Symptoms of hypoglycemia (sleepiness, dizziness, and confusion) are similar to symptoms of having too much alcohol. If your health care provider says that alcohol is safe for you, follow these guidelines:  Limit alcohol intake to no more than 1 drink per day for nonpregnant women and 2 drinks per day for men. One drink equals 12 oz of beer, 5 oz of wine, or 1 oz of hard liquor.  Do not drink on an empty stomach.  Keep yourself hydrated with water, diet soda, or unsweetened iced tea.  Keep in mind that regular soda, juice, and other mixers may contain a lot of sugar and must be counted as carbs. What are tips for following this plan?  Reading food labels  Start by checking the serving size on the "Nutrition Facts" label of packaged foods and drinks. The amount of calories, carbs, fats, and other nutrients listed on the label is based  on one serving of the item. Many items contain more than one serving per package.  Check the total grams (g) of carbs in one serving. You can calculate the number of servings of carbs in one serving by dividing the total carbs by 15. For example, if a food has 30 g of total carbs, it would be equal to 2 servings of carbs.  Check the number of grams (g) of saturated and trans fats in one serving. Choose foods that have low or no amount of these fats.  Check the  number of milligrams (mg) of salt (sodium) in one serving. Most people should limit total sodium intake to less than 2,300 mg per day.  Always check the nutrition information of foods labeled as "low-fat" or "nonfat". These foods may be higher in added sugar or refined carbs and should be avoided.  Talk to your dietitian to identify your daily goals for nutrients listed on the label. Shopping  Avoid buying canned, premade, or processed foods. These foods tend to be high in fat, sodium, and added sugar.  Shop around the outside edge of the grocery store. This includes fresh fruits and vegetables, bulk grains, fresh meats, and fresh dairy. Cooking  Use low-heat cooking methods, such as baking, instead of high-heat cooking methods like deep frying.  Cook using healthy oils, such as olive, canola, or sunflower oil.  Avoid cooking with butter, cream, or high-fat meats. Meal planning  Eat meals and snacks regularly, preferably at the same times every day. Avoid going long periods of time without eating.  Eat foods high in fiber, such as fresh fruits, vegetables, beans, and whole grains. Talk to your dietitian about how many servings of carbs you can eat at each meal.  Eat 4-6 ounces (oz) of lean protein each day, such as lean meat, chicken, fish, eggs, or tofu. One oz of lean protein is equal to: ? 1 oz of meat, chicken, or fish. ? 1 egg. ?  cup of tofu.  Eat some foods each day that contain healthy fats, such as avocado, nuts, seeds, and fish. Lifestyle  Check your blood glucose regularly.  Exercise regularly as told by your health care provider. This may include: ? 150 minutes of moderate-intensity or vigorous-intensity exercise each week. This could be brisk walking, biking, or water aerobics. ? Stretching and doing strength exercises, such as yoga or weightlifting, at least 2 times a week.  Take medicines as told by your health care provider.  Do not use any products that  contain nicotine or tobacco, such as cigarettes and e-cigarettes. If you need help quitting, ask your health care provider.  Work with a Social worker or diabetes educator to identify strategies to manage stress and any emotional and social challenges. Questions to ask a health care provider  Do I need to meet with a diabetes educator?  Do I need to meet with a dietitian?  What number can I call if I have questions?  When are the best times to check my blood glucose? Where to find more information:  American Diabetes Association: diabetes.org  Academy of Nutrition and Dietetics: www.eatright.[employer] of Diabetes and Digestive and Kidney Diseases (NIH): [employer] Summary  A healthy meal plan will help you control your blood glucose and maintain a healthy lifestyle.  Working with a diet and nutrition specialist (dietitian) can help you make a meal plan that is best for you.  Keep in mind that carbohydrates (carbs) and alcohol have immediate effects on  your blood glucose levels. It is important to count carbs and to use alcohol carefully. This information is not intended to replace advice given to you by your health care provider. Make sure you discuss any questions you have with your health care provider. Document Revised: 10/02/2017 Document Reviewed: 11/24/2016 Elsevier Patient Education  2020 [employer].

## 2020-08-09 ENCOUNTER — Encounter: Payer: Self-pay | Admitting: Internal Medicine

## 2020-08-09 ENCOUNTER — Other Ambulatory Visit: Payer: Self-pay

## 2020-08-09 LAB — BMP8+ANION GAP
Anion Gap: 18 mmol/L (ref 10.0–18.0)
BUN/Creatinine Ratio: 14 (ref 9–23)
BUN: 10 mg/dL (ref 6–24)
CO2: 20 mmol/L (ref 20–29)
Calcium: 9.5 mg/dL (ref 8.7–10.2)
Chloride: 101 mmol/L (ref 96–106)
Creatinine, Ser: 0.71 mg/dL (ref 0.57–1.00)
GFR calc Af Amer: 114 mL/min/{1.73_m2} (ref >59)
GFR calc non Af Amer: 99 mL/min/{1.73_m2} (ref >59)
Glucose: 262 mg/dL — ABNORMAL HIGH (ref 65–99)
Potassium: 4.5 mmol/L (ref 3.5–5.2)
Sodium: 139 mmol/L (ref 134–144)

## 2020-08-09 LAB — MICROALBUMIN / CREATININE URINE RATIO
Creatinine, Urine: 172 mg/dL
Microalb/Creat Ratio: 4 mg/g creat (ref 0–29)
Microalbumin, Urine: 7.4 ug/mL

## 2020-08-09 LAB — LIPID PANEL
Chol/HDL Ratio: 4.4 ratio (ref 0.0–4.4)
Cholesterol, Total: 154 mg/dL (ref 100–199)
HDL: 35 mg/dL — ABNORMAL LOW (ref >39)
LDL Chol Calc (NIH): 94 mg/dL (ref 0–99)
Triglycerides: 142 mg/dL (ref 0–149)
VLDL Cholesterol Cal: 25 mg/dL (ref 5–40)

## 2020-08-09 NOTE — Patient Outreach (Signed)
Care Coordination  08/09/2020  Shemeka Wardle Bord 11/15/1970 [id number]  An unsuccessful telephone outreach was attempted today. The patient was referred to the case management team for assistance with care management and care coordination.   Follow Up Plan: Social Worker will contact patient in 7 days.Mickel Fuchs, BSW, [place]  High Risk Managed Medicaid Team

## 2020-08-09 NOTE — Assessment & Plan Note (Signed)
Tina [name] mentions significant life events this year which have exacerbated by depression symptoms. She mentions that her mother is currently on hospice and she is expected to pass at any moment. She also mentions having difficulty coping with death of her brother who passed from [place] this year. She mentions having significant depressive symptoms including increased sleep, overeating, fatigue, lack of enjoyment and feeling sad. She mentions previously being seen by Miquel Dunn although was told she needs to find someone else.  A/P Present w/ MDD exacerbated by grief. Currently on sertraline, buspirone, trazodone. Had prolonged conversation regarding counseling and time for her depressive symptoms to pass. Also discussed option of increasing her anti-depressant doses which she was not interested in. Chart reivew shows referral to integrated behavioral health for new counseling. - F/u w/ [facility]

## 2020-08-09 NOTE — Assessment & Plan Note (Addendum)
Lab Results  Component Value Date   HGBA1C >14.0 (A) 07/30/2020   [name] is a 49 yo F w/ PMH of HTN, DM, MDD presenting to [facility] for f/u for management of her diabetes mellitus. She mentions that she has been experiencing significant family events that have triggered her depression and she has been eating 'unhealthy foods' as well as intermittently missing her medication doses. She currently denies any nausea, vomiting, diarrhea but have been endorsing polydipsia, polyphagia, polyuria. She mentions having moved recently and not being able to find her glucometer so she does not keep track of her sugars at home.  A/P Presenting w/ uncontrolled DM exacerbated by non-adherence to home regimen due to MDD. Also complicated by lack of medical insurance and need to rely on discount programs to afford meds. Currently prescribed Lantus 35 units qhs, Novolog 7 units TID qc, Janumet. Chart review shows previous hypoglycemic events in am but none in pm. Will increase mealtime insulin as well as starting SGLT-2 inhibitor. - BMP, lipid panel, microalbuminuria screen - Start canagliflozin 100mg   - C/w Janumet 50-1000mg , Lantus 35 units qhs - Increase Novolog to 10 units TID qc - Advised to buy [employer] Reli-on Glucometer and start keeping track of cbgs - Advised on importance of medication adherence and following diabetic diet

## 2020-08-09 NOTE — Assessment & Plan Note (Signed)
BP Readings from Last 3 Encounters:  08/08/20 130/72  07/30/20 (!) 146/64  10/10/19 137/72   BP above goal but acceptable. Currently on enalapril. Denies any rash, cough. Can add on other anti-hypertensive class if continues to be hypertensive.  - C/w enalapril 20mg  daily - BMP

## 2020-08-09 NOTE — Progress Notes (Signed)
CC: Diabetes  HPI: Ms.Maryanna Davetta Olliff is a 49 y.o. with PMH listed below presenting with complaint of diabetes. Please see problem based assessment and plan for further details.  Past Medical History:  Diagnosis Date  . Diabetes mellitus    type II  . Fibroids   . HTN (hypertension)   . Hyperlipidemia   . Iron deficiency anemia   . Left acetabular fracture ([place])   . Lumbar strain   . Obesity   . Ovarian cyst    Review of Systems: Review of Systems  Constitutional: Negative for chills, fever and malaise/fatigue.  Eyes: Negative for blurred vision.  Respiratory: Negative for shortness of breath.   Cardiovascular: Negative for chest pain, palpitations and leg swelling.  Gastrointestinal: Negative for constipation, diarrhea, nausea and vomiting.  Musculoskeletal: Positive for back pain and joint pain.  Neurological: Negative for dizziness.  Psychiatric/Behavioral: Positive for depression. Negative for suicidal ideas. The patient is nervous/anxious.   All other systems reviewed and are negative.    Physical Exam: Vitals:   08/08/20 0849 08/08/20 0953  BP: (!) 142/72 130/72  Pulse: 87   Temp: 98.4 F (36.9 C)   TempSrc: Oral   SpO2: 100%   Weight: 259 lb 8 oz (117.7 kg)   Height: 6\' 4"  (1.93 m)    Gen: Well-developed, well nourished, NAD HEENT: NCAT head, hearing intact CV: RRR, S1, S2 normal Pulm: CTAB, No rales, no wheezes Extm: ROM intact, Peripheral pulses intact, No peripheral edema Skin: Dry, Warm, normal turgor, no wounds, no rashes, no lesions Psych: Tearful  Assessment & Plan:   Hypertension BP Readings from Last 3 Encounters:  08/08/20 130/72  07/30/20 (!) 146/64  10/10/19 137/72   BP above goal but acceptable. Currently on enalapril. Denies any rash, cough. Can add on other anti-hypertensive class if continues to be hypertensive.  - C/w enalapril 20mg  daily - BMP  Depression Ms.Mallo mentions significant life events this year  which have exacerbated by depression symptoms. She mentions that her mother is currently on hospice and she is expected to pass at any moment. She also mentions having difficulty coping with death of her brother who passed from [place] this year. She mentions having significant depressive symptoms including increased sleep, overeating, fatigue, lack of enjoyment and feeling sad. She mentions previously being seen by Miquel Dunn although was told she needs to find someone else.  A/P Present w/ MDD exacerbated by grief. Currently on sertraline, buspirone, trazodone. Had prolonged conversation regarding counseling and time for her depressive symptoms to pass. Also discussed option of increasing her anti-depressant doses which she was not interested in. Chart reivew shows referral to integrated behavioral health for new counseling. - F/u w/ [facility]  Uncontrolled type 2 diabetes mellitus ([place]) Lab Results  Component Value Date   HGBA1C >14.0 (A) 07/30/2020   Ms.Aleysha Meckler is a 49 yo F w/ PMH of HTN, DM, MDD presenting to [facility] for f/u for management of her diabetes mellitus. She mentions that she has been experiencing significant family events that have triggered her depression and she has been eating 'unhealthy foods' as well as intermittently missing her medication doses. She currently denies any nausea, vomiting, diarrhea but have been endorsing polydipsia, polyphagia, polyuria. She mentions having moved recently and not being able to find her glucometer so she does not keep track of her sugars at home.  A/P Presenting w/ uncontrolled DM exacerbated by non-adherence to home regimen due to MDD. Also complicated by lack of medical insurance and  need to rely on discount programs to afford meds. Currently prescribed Lantus 35 units qhs, Novolog 7 units TID qc, Janumet. Chart review shows previous hypoglycemic events in am but none in pm. Will increase mealtime insulin as well as starting SGLT-2 inhibitor. - BMP,  lipid panel, microalbuminuria screen - Start canagliflozin 100mg   - C/w Janumet 50-1000mg , Lantus 35 units qhs - Increase Novolog to 10 units TID qc - Advised to buy [employer] Reli-on Glucometer and start keeping track of cbgs - Advised on importance of medication adherence and following diabetic diet  Fibroids Has hx of fibroids and gets Depo-Provera injections in clinic. Last injection 6/30. Requesting injection today. Out of 3 month window. POC pregnancy test negative. Okay to receive injection today.  - Depo-Provera 104mg  in clinic    Patient discussed with Dr. Evette Doffing  -Gilberto Better, [facility] Pager: [phone]

## 2020-08-09 NOTE — Assessment & Plan Note (Signed)
Has hx of fibroids and gets Depo-Provera injections in clinic. Last injection 6/30. Requesting injection today. Out of 3 month window. POC pregnancy test negative. Okay to receive injection today.  - Depo-Provera 104mg  in clinic

## 2020-08-09 NOTE — Patient Instructions (Signed)
Visit Information  Ms. [employer]  - as a part of your Medicaid benefit, you are eligible for care management and care coordination services at no cost or copay. I was unable to reach you by phone today but would be happy to help you with your health related needs. Please feel free to call me at [phone].   A member of the Managed Medicaid care management team will reach out to you again over the next 7 days.   Mickel Fuchs, BSW, [place]  High Risk Managed Medicaid Team

## 2020-08-10 NOTE — Progress Notes (Signed)
Internal Medicine Clinic Attending  Case discussed with Dr. Lee  At the time of the visit.  We reviewed the resident's history and exam and pertinent patient test results.  I agree with the assessment, diagnosis, and plan of care documented in the resident's note.    

## 2020-08-14 MED FILL — ENALAPRIL MALEATE 20 MG TAB: 20 | 30 days supply | Qty: 30 | Fill #1

## 2020-08-16 ENCOUNTER — Other Ambulatory Visit: Payer: Self-pay

## 2020-08-16 ENCOUNTER — Encounter: Payer: Self-pay | Admitting: Obstetrics and Gynecology

## 2020-08-16 NOTE — Patient Outreach (Signed)
Care Coordination- Social Work  08/16/2020  Tina [name] 1971/05/14 [id number]  Subjective:    Tina [name] is an 49 y.o. year old female who is a primary patient of [name], MD.    [name] was given information about Medicaid Managed Care team care coordination services today. Jodi Marble Klar agreed to services and verbal consent obtained  Review of patient status, laboratory and other test data was performed as part of evaluation for provision of services.  SDOH:   SDOH Screenings   Alcohol Screen: Low Risk   . Last Alcohol Screening Score (AUDIT): 1  Depression (PHQ2-9): Medium Risk  . PHQ-2 Score: 15  Financial Resource Strain: Medium Risk  . Difficulty of Paying Living Expenses: Somewhat hard  Food Insecurity: No Food Insecurity  . Worried About Charity fundraiser in the Last Year: Never true  . Ran Out of Food in the Last Year: Never true  Housing: Low Risk   . Last Housing Risk Score: 0  Physical Activity: Inactive  . Days of Exercise per Week: 0 days  . Minutes of Exercise per Session: 0 min  Social Connections: Moderately Isolated  . Frequency of Communication with Friends and Family: More than three times a week  . Frequency of Social Gatherings with Friends and Family: More than three times a week  . Attends Religious Services: More than 4 times per year  . Active Member of Clubs or Organizations: No  . Attends Archivist Meetings: Never  . Marital Status: Never married  Stress: Stress Concern Present  . Feeling of Stress : Very much  Tobacco Use: High Risk  . Smoking Tobacco Use: Current Every Day Smoker  . Smokeless Tobacco Use: Never Used  Transportation Needs: No Transportation Needs  . Lack of Transportation (Medical): No  . Lack of Transportation (Non-Medical): No     Objective:    Medications:  Medications Reviewed Today    Reviewed by [name], LCSW (Social Worker)  on 08/16/20 at 1337  Med List Status: <None>  Medication Order Taking? Sig Documenting Provider Last Dose Status Informant  albuterol (PROVENTIL HFA;VENTOLIN HFA) 108 (90 Base) MCG/ACT inhaler [id number]  Inhale 1-2 puffs into the lungs every 6 (six) hours as needed for wheezing or shortness of breath. Petrucelli, [name], PA-C  Active   atorvastatin (LIPITOR) 40 MG tablet [id number]  Take 1 tablet (40 mg total) by mouth daily. Welford Roche, MD  Active   Blood Glucose Monitoring Suppl (CONTOUR NEXT ONE) KIT [id number]  1 each by Does not apply route See admin instructions. USE TO CHECK BLOOD GLUCOSE ONCE DAILY. IM PROGRAM. [name], MD  Active   busPIRone (BUSPAR) 7.5 MG tablet [id number] Yes Take 1 tablet by mouth 3 (three) times daily. [provider] Taking Active   canagliflozin (INVOKANA) 100 MG TABS tablet [id number]  Take 1 tablet (100 mg total) by mouth daily before breakfast. [name], MD  Active   cyclobenzaprine (FEXMID) 7.5 MG tablet [id number]  Take 1 tablet (7.5 mg total) by mouth 3 (three) times daily as needed for muscle spasms. Marcelyn Bruins, MD  Active   enalapril (VASOTEC) 20 MG tablet [id number]  TAKE 1 TABLET (20 MG TOTAL) BY MOUTH DAILY. Andrew Au, MD  Active   gabapentin (NEURONTIN) 300 MG capsule [id number]  Take 1 capsule (300 mg total) by mouth 3 (three) times daily for 3 days, THEN 2 capsules (600 mg total) 3 (three) times daily. Christian, [name],  MD  Active   glucose blood (CONTOUR NEXT TEST) test strip [id number]  Please use to check your sugar 3 times a day. Welford Roche, MD  Active   hydrOXYzine (ATARAX/VISTARIL) 25 MG tablet [id number] Yes Take 25 mg by mouth 3 (three) times daily as needed. [provider] Taking Active   ibuprofen (ADVIL) 800 MG tablet [id number]  Take 1 tablet (800 mg total) by mouth every 8 (eight) hours as needed. [name], MD  Active   insulin aspart (NOVOLOG FLEXPEN) 100 UNIT/ML FlexPen  [id number]  Inject 10 Units into the skin 3 (three) times daily with meals. INJECT 10 UNITS INTO THE SKIN 3 TIMES DAILY WITH MEALS. [name], MD  Active   Insulin Glargine (LANTUS SOLOSTAR) 100 UNIT/ML Solostar Pen [id number]  Inject 35 Units into the skin daily at 10 pm. Welford Roche, MD  Active   Insulin Pen Needle (UNIFINE PENTIPS) 31G X 5 MM MISC [id number]  Inject 1 Units into the skin 3 (three) times daily. Use to inject insulin 4 times daily. IM PROGRAM Welford Roche, MD  Active   lamoTRIgine (LAMICTAL) 100 MG tablet [id number] Yes Take 100 mg by mouth 2 (two) times daily.  [provider] Taking Active   Lancets [place] [id number]  1 Units by Does not apply route 3 (three) times daily. Welford Roche, MD  Active   meloxicam (MOBIC) 15 MG tablet [id number]  Take 1 tablet (15 mg total) by mouth daily.  Patient not taking: Reported on 08/03/2020   [name], DPM  Active   Multiple Vitamins-Minerals (MULTIVITAMIN WITH MINERALS) tablet [id number]  Take 1 tablet by mouth daily.   [provider]  Active   nystatin (NYSTATIN) powder [id number]  Apply topically 4 (four) times daily. Welford Roche, MD  Active   sertraline (ZOLOFT) 100 MG tablet [id number] Yes Take 100 mg by mouth daily. [provider] Taking Active   sitaGLIPtin-metformin (JANUMET) 50-1000 MG tablet [id number]  TAKE 1 TABLET BY MOUTH 2 TIMES DAILY WITH A MEAL. Welford Roche, MD  Active   traZODone (DESYREL) 50 MG tablet [id number] Yes Take 50 mg by mouth at bedtime. [provider] Taking Active           Fall/Depression Screening:  Fall Risk  08/08/2020 08/03/2020 07/30/2020  Falls in the past year? 1 1 0  Comment - Patient stated she fell about 2 months ago. -  Number falls in past yr: 0 - -  Comment - - -  Injury with Fall? 1 - -  Risk for fall due to : - - Impaired balance/gait;Impaired mobility  Follow up - - Falls evaluation completed   PHQ  2/9 Scores 08/08/2020 07/30/2020 10/10/2019 08/04/2019 04/14/2019 01/07/2019 04/13/2018  PHQ - 2 Score _0 PHQ- 9 Score _1 -  Exception Documentation - - - - - - -    Assessment:  Goals Addressed              This Visit's Progress   .  "I need resources to help pay for my diabetes medications." (pt-stated)   Not on track     [place] (see longitudinal plan of care for additional care plan information)  Current Barriers:  . Community resources access barrier: Patient Lacks knowledge of community resource: to assist with medication obtaining medication such as the [employer]. . Medication procurement . Financial constraints related to  unemployment and on disability and having no insurance. Patient stated she is not eligible for Medicaid due to disability payments being $24 over the limit for Medicaid.   Clinical Social Work Clinical Goal(s):  Marland Kitchen Over the next 7-14 days, patient will work with BSW to address concerns related to resources needed  Interventions: . Inter-disciplinary care team collaboration (see longitudinal plan of care) . Patient interviewed and appropriate assessments performed . Referred patient to community resources care guide team for assistance with resources to assist with patient getting her diabetes medications. . BSW will contact patient to discuss needs and available resources.  Patient Self Care Activities:  . Patient will work with the Managed Medicaid team. . Licensed Clinical Social Worker will refer to BSW to assist with resources.  Please see past updates related to this goal by clicking on the "Past Updates" button in the selected goal     .  "I want to better control my diabetes." (pt-stated)   Not on track     [place] (see longitudinal plan of care for additional care plan information)  Current Barriers:  . Community resources access barrier:  Patient Lacks knowledge of community resource: Patient does not have Medicaid or Medicare and doesn't know any resources to assist with obtaining her medication. . Social Isolation . Medication procurement . Financial constraints related to receiving disability check but having no insurance. . Limited social support  Clinical Social Work Clinical Goal(s):  Marland Kitchen Over the next 7-14 days, patient will work with SW to address concerns related to resources to assist with medication procurement. . Over the next 7-14 days, patient will work with RN CM to address needs related to healthy lifestyle and improving diabetes management .  Interventions: . Inter-disciplinary care team collaboration (see longitudinal plan of care) . Patient interviewed and appropriate assessments performed . Provided mental health counseling with regard to diabetes and depression. Patient stated she is unable to adequately care for her own health due to the current stressors of dealing with her terminally ill mother. She stated she wants to live and not die, but she is so overwhelmed.  . Advised patient to begin getting some form of exercise daily to help with both mental and physical health. Nash Dimmer with RN Case Manager re: diabetes management . Collaborated with BSW re: resources to assist with medication procurement, including the [place]  Patient Self Care Activities:  . Patient will continue to take medications as prescribed. . RN Case Manager will work with patient to help her with lifestyle changes for help improvement. . Licensed Clinical Social Worker will work with patient for depression symptom management regarding diabetes. . BSW will work with patient regarding resources.   Please see past updates related to this goal by clicking on the "Past Updates" button in the selected goal     .  "I want to better control my high blood pressure." (pt-stated)   Not on track     [place] (see longitudinal plan of care for additional care plan information)  Current Barriers:  Marland Kitchen Mental Health Concerns . Social Isolation . Limited social support  Clinical Social Work Clinical Goal(s):  Marland Kitchen Over the next 30 days, patient will work with SW to address concerns related to managing stress to decrease blood pressure . Over the next 30 days, patient will demonstrate improved health management independence as evidenced by self-reported decreased blood pressure reading  Interventions: . Inter-disciplinary  care team collaboration (see longitudinal plan of care) . Patient interviewed and appropriate assessments performed . Provided mental health counseling with regard to high blood pressure and depression  . Provided patient with information about the negative effects of high blood pressure. Patient stated she gets headaches and does feel that her blood pressure is high. She stated she needs different treatment but cannot get it because she is not eligible for Medicaid due to receiving disability payments that amount to $24 over the limit.  . Discussed plans with patient for ongoing care management follow up and provided patient with direct contact information for care management team . Collaborated with RN Case Manager re: high blood pressure . Referred patient to RN CM for blood pressure management.  Patient Self Care Activities:  . Patient will continue to take her medication as prescribed. . RN Case Manager will contact patient to discuss blood pressure management.   Please see past updates related to this goal by clicking on the "Past Updates" button in the selected goal     .  "I want to start outpatient therapy again." (pt-stated)   Not on track     [place] (see longitudinal plan of care for additional care plan information)  Current Barriers:  . Patient needs assistance with appointment scheduling and follow up with [facility]. . Mental Health Concerns - Patient struggles with depression but hasn't received therapy since June due to changes within the agency where she was receiving services at that time.  . Patient stated that today (08/16/2020), she has taken the last pill for one of her depression medications.   Clinical Social Work Clinical Goal(s):  Marland Kitchen Over the next 7-14 days, patient will work with LCSW to address needs related to managing stress.  Patient stated she continues to work with her mother, who is experiencing decline. She stated a lot of things make her angry because she wants her mother to receive the best care as possible. She states she can't have time to herself because she is always caring for her mother. She states she has 5 siblings (a brother and a sister passed away). She states 2 brothers live locally but they don't help out as much as she does. . Over the next 14-30 days, patient will work with BSW to schedule appointment at [facility]). Patient states BSW called her while she was taking care of things for her mother, who receives in-home hospice care. However, she stated she was supposed to return her call but she doesn't have the number. She has not been scheduled for outpatient therapy at this time.    Interventions: . Inter-disciplinary care team collaboration (see longitudinal plan of care) . Patient interviewed and appropriate assessments performed . Referred patient to BSW for assistance with scheduling appointment for therapy and medication management.  Nash Dimmer with BSW re: scheduling appointment at [facility] - [phone].  Patient Self Care Activities:  . Patient will continue to take medications as prescribed until she can be seen at [place] will work with patient and provider to assist with scheduling therapy and medication management.  Marland Kitchen LCSW will follow-up in 14-30 days.  Please see past updates related to this  goal by clicking on the "Past Updates" button in the selected goal        Plan: LCSW will follow-up with patient on September 06, 2020 @ 1:00pm.  [name], BSW, MSW, LCSW Social  Work Tourist information centre manager - [place]  [employer] [place]: [phone]

## 2020-08-16 NOTE — Patient Outreach (Signed)
Care Coordination  08/16/2020  Tina [name] 1971/04/13 [id number]   An unsuccessful telephone outreach was attempted today. The patient was referred to the case management team for assistance with care management and care coordination.   Follow Up Plan: The Managed Medicaid care management team will reach out to the patient again over the next 7 days.   Aida Raider RN, BSN [place]  [facility] [employer] - Managed Medicaid High Risk [phone]

## 2020-08-16 NOTE — Patient Instructions (Signed)
   HI Ms. Pharo  -  I was unable to reach you by phone today but would be happy to help you with your health related needs. Please feel free to call me at [phone].  A member of the care management team will reach out to you again over the next 7 days.   Aida Raider RN, BSN [place]  [facility] - Managed Medicaid High Risk [phone]

## 2020-08-16 NOTE — Patient Instructions (Signed)
Visit Information Tina [name], it was a pleasure speaking with you today. Please remember that the Care Management team members will be contacting you to assist you with care coordination.   Tina [name] was given information about Medicaid Managed Care team care coordination services and consented to engagement with the [facility] Managed Care team.   Goals Addressed              This Visit's Progress     "I need resources to help pay for my diabetes medications." (pt-stated)   Not on track     [place] (see longitudinal plan of care for additional care plan information)  Current Barriers:   Community resources access barrier: Patient Lacks knowledge of community resource: to assist with medication obtaining medication such as the [facility].  Medication procurement  Financial constraints related to unemployment and on disability and having no insurance. Patient stated she is not eligible for Medicaid due to disability payments being $24 over the limit for Medicaid.   Clinical Social Work Clinical Goal(s):   Over the next 7-14 days, patient will work with BSW to address concerns related to resources needed  Interventions:  Inter-disciplinary care team collaboration (see longitudinal plan of care)  Patient interviewed and appropriate assessments performed  Referred patient to community resources care guide team for assistance with resources to assist with patient getting her diabetes medications.  BSW will contact patient to discuss needs and available resources.  Patient Self Care Activities:   Patient will work with the Managed Medicaid team.  Licensed Clinical Social Worker will refer to [place] to assist with resources.  Please see past updates related to this goal by clicking on the "Past Updates" button in the selected goal       "I want to better control my diabetes." (pt-stated)   Not on track     [place] (see longitudinal plan of care for additional care plan information)  Current Barriers:   Community resources access barrier: Patient Lacks knowledge of community resource: Patient does not have Medicaid or Medicare and doesn't know any resources to assist with obtaining her medication.  Social Isolation  Medication Technical brewer constraints related to receiving disability check but having no insurance.  Limited social support  Clinical Social Work Clinical Goal(s):   Over the next 7-14 days, patient will work with SW to address concerns related to resources to assist with medication procurement.  Over the next 7-14 days, patient will work with RN CM to address needs related to healthy lifestyle and improving diabetes management .  Interventions:  Inter-disciplinary care team collaboration (see longitudinal plan of care)  Patient interviewed and appropriate assessments performed  Provided mental health counseling with regard to diabetes and depression. Patient stated she is unable to adequately care for her own health due to the current stressors of dealing with her terminally ill mother. She stated she wants to live and not die, but she is so overwhelmed.   Advised patient to begin getting some form of exercise daily to help with both mental and physical health.  Collaborated with RN Case Manager re: diabetes management  Collaborated with BSW re: resources to assist with medication procurement, including the [place]  Patient Self Care Activities:   Patient will continue to take medications as prescribed.  RN Case Manager will work with patient to help her with lifestyle changes for help improvement.  Licensed Clinical Social  Worker will work with patient for depression symptom management regarding diabetes.  BSW will work with patient regarding resources.   Please see past updates related to this goal by clicking on the  "Past Updates" button in the selected goal       "I want to better control my high blood pressure." (pt-stated)   Not on track     [place] (see longitudinal plan of care for additional care plan information)  Current Barriers:   Mental Health Concerns  Social Isolation  Limited social support  Clinical Social Work Clinical Goal(s):   Over the next 30 days, patient will work with SW to address concerns related to managing stress to decrease blood pressure  Over the next 30 days, patient will demonstrate improved health management independence as evidenced by self-reported decreased blood pressure reading  Interventions:  Inter-disciplinary care team collaboration (see longitudinal plan of care)  Patient interviewed and appropriate assessments performed  Provided mental health counseling with regard to high blood pressure and depression   Provided patient with information about the negative effects of high blood pressure. Patient stated she gets headaches and does feel that her blood pressure is high. She stated she needs different treatment but cannot get it because she is not eligible for Medicaid due to receiving disability payments that amount to $24 over the limit.   Discussed plans with patient for ongoing care management follow up and provided patient with direct contact information for care management team  Collaborated with RN Case Manager re: high blood pressure  Referred patient to RN CM for blood pressure management.  Patient Self Care Activities:   Patient will continue to take her medication as prescribed.  RN Case Manager will contact patient to discuss blood pressure management.   Please see past updates related to this goal by clicking on the "Past Updates" button in the selected goal       "I want to start outpatient therapy again." (pt-stated)   Not on track     [place] (see longitudinal  plan of care for additional care plan information)  Current Barriers:   Patient needs assistance with appointment scheduling and follow [facility]  Mental Health Concerns - Patient struggles with depression but hasn't received therapy since June due to changes within the agency where she was receiving services at that time.   Patient stated that today (08/16/2020), she has taken the last pill for one of her depression medications.   Clinical Social Work Clinical Goal(s):   Over the next 7-14 days, patient will work with LCSW to address needs related to managing stress.  Patient stated she continues to work with her mother, who is experiencing decline. She stated a lot of things make her angry because she wants her mother to receive the best care as possible. She states she can't have time to herself because she is always caring for her mother. She states she has 5 siblings (a brother and a sister passed away). She states 2 brothers live locally but they don't help out as much as she does.  Over the next 14-30 days, patient will work with BSW to schedule appointment at [facility] Patient states BSW called her while she was taking care of things for her mother, who receives in-home hospice care. However, she stated she was supposed to return her call but she doesn't have the number. She  has not been scheduled for outpatient therapy at this time.    Interventions:  Inter-disciplinary care team collaboration (see longitudinal plan of care)  Patient interviewed and appropriate assessments performed  Referred patient to BSW for assistance with scheduling appointment for therapy and medication management.   Collaborated with BSW re: scheduling appointment at [facility] - [phone].  Patient Self Care Activities:   Patient will continue to take medications as prescribed until she can be seen at [facility] will work with  patient and provider to assist with scheduling therapy and medication management.   LCSW will follow-up in 14-30 days.  Please see past updates related to this goal by clicking on the "Past Updates" button in the selected goal        Please see education materials related to managing stress provided below.   Managing Stress, Adult Feeling a certain amount of stress is normal. Stress helps our body and mind get ready to deal with the demands of life. Stress hormones can motivate you to do well at work and meet your responsibilities. However severe or long-lasting (chronic) stress can affect your mental and physical health. Chronic stress puts you at higher risk for anxiety, depression, and other health problems like digestive problems, muscle aches, heart disease, high blood pressure, and stroke. What are the causes? Common causes of stress include:  Demands from work, such as deadlines, feeling overworked, or having long hours.  Pressures at home, such as money issues, disagreements with a spouse, or parenting issues.  Pressures from major life changes, such as divorce, moving, loss of a loved one, or chronic illness. You may be at higher risk for stress-related problems if you do not get enough sleep, are in poor health, do not have emotional support, or have a mental health disorder like anxiety or depression. How to recognize stress Stress can make you:  Have trouble sleeping.  Feel sad, anxious, irritable, or overwhelmed.  Lose your appetite.  Overeat or want to eat unhealthy foods.  Want to use drugs or alcohol. Stress can also cause physical symptoms, such as:  Sore, tense muscles, especially in the shoulders and neck.  Headaches.  Trouble breathing.  A faster heart rate.  Stomach pain, nausea, or vomiting.  Diarrhea or constipation.  Trouble concentrating. Follow these instructions at home: Lifestyle  Identify the source of your stress and your reaction to  it. See a therapist who can help you change your reactions.  When there are stressful events: ? Talk about it with family, friends, or co-workers. ? Try to think realistically about stressful events and not ignore them or overreact. ? Try to find the positives in a stressful situation and not focus on the negatives. ? Cut back on responsibilities at work and home, if possible. Ask for help from friends or family members if you need it.  Find ways to cope with stress, such as: ? Meditation. ? Deep breathing. ? Yoga or tai chi. ? Progressive muscle relaxation. ? Doing art, playing music, or reading. ? Making time for fun activities. ? Spending time with family and friends.  Get support from family, friends, or spiritual resources. Eating and drinking  Eat a healthy diet. This includes: ? Eating foods that are high in fiber, such as beans, whole grains, and fresh fruits and vegetables. ? Limiting foods that are high in fat and processed sugars, such as fried and sweet foods.  Do not skip meals or overeat.  Drink enough fluid to keep your  urine pale yellow. Alcohol use  Do not drink alcohol if: ? Your health care provider tells you not to drink. ? You are pregnant, may be pregnant, or are planning to become pregnant.  Drinking alcohol is a way some people try to ease their stress. This can be dangerous, so if you drink alcohol: ? Limit how much you use to:  0-1 drink a day for women.  0-2 drinks a day for men. ? Be aware of how much alcohol is in your drink. In the [place] one drink equals one 12 oz bottle of beer (355 mL), one 5 oz glass of wine (148 mL), or one 1 oz glass of hard liquor (44 mL). Activity   Include 30 minutes of exercise in your daily schedule. Exercise is a good stress reducer.  Include time in your day for an activity that you find relaxing. Try taking a walk, going on a bike ride, reading a book, or listening to music.  Schedule your time in a way that  lowers stress, and keep a consistent schedule. Prioritize what is most important to get done. General instructions  Get enough sleep. Try to go to sleep and get up at about the same time every day.  Take over-the-counter and prescription medicines only as told by your health care provider.  Do not use any products that contain nicotine or tobacco, such as cigarettes, e-cigarettes, and chewing tobacco. If you need help quitting, ask your health care provider.  Do not use drugs or smoke to cope with stress.  Keep all follow-up visits as told by your health care provider. This is important. Where to find support  Talk with your health care provider about stress management or finding a support group.  Find a therapist to work with you on your stress management techniques. Contact a health care provider if:  Your stress symptoms get worse.  You are unable to manage your stress at home.  You are struggling to stop using drugs or alcohol. Get help right away if:  You may be a danger to yourself or others.  You have any thoughts of death or suicide. If you ever feel like you may hurt yourself or others, or have thoughts about taking your own life, get help right away. You can go to your nearest emergency department or call:  Your local emergency services (911 in the [place]  A suicide crisis helpline, such as the [place] at (640) 003-5508. This is open 24 hours a day. Summary  Feeling a certain amount of stress is normal, but severe or long-lasting (chronic) stress can affect your mental and physical health.  Chronic stress can put you at higher risk for anxiety, depression, and other health problems like digestive problems, muscle aches, heart disease, high blood pressure, and stroke.  You may be at higher risk for stress-related problems if you do not get enough sleep, are in poor health, lack emotional support, or have a mental health disorder like  anxiety or depression.  Identify the source of your stress and your reaction to it. Try talking about stressful events with family, friends, or co-workers, finding a coping method, or getting support from spiritual resources.  If you need more help, talk with your health care provider about finding a support group or a mental health therapist. This information is not intended to replace advice given to you by your health care provider. Make sure you discuss any questions you have with your health care provider.  Document Revised: 05/18/2019 Document Reviewed: 05/18/2019 Elsevier Patient Education  [place].   Patient verbalizes understanding of instructions provided today.   Mental Health Provider appointment is being scheduled by BSW. Telephone follow up appointment with Managed Medicaid Care Management LCSW scheduled for: September 06, 2020 @ 1:00pm.  [name], BSW, MSW, [place]: [phone]

## 2020-08-17 ENCOUNTER — Other Ambulatory Visit: Payer: Self-pay

## 2020-08-17 ENCOUNTER — Other Ambulatory Visit (HOSPITAL_COMMUNITY): Payer: Self-pay | Admitting: Internal Medicine

## 2020-08-17 ENCOUNTER — Other Ambulatory Visit: Payer: Self-pay | Admitting: Internal Medicine

## 2020-08-17 DIAGNOSIS — F32A Depression, unspecified: Secondary | ICD-10-CM

## 2020-08-17 MED ORDER — SERTRALINE HCL 100 MG PO TABS: 100.0000 mg | ORAL_TABLET | Freq: Every day | ORAL | 3 refills | Status: DC

## 2020-08-17 MED FILL — SERTRALINE HCL 100 MG TAB: 100 | 30 days supply | Qty: 30 | Fill #0

## 2020-08-17 NOTE — Patient Outreach (Signed)
Care Coordination- Social Work  08/17/2020  Tina [name] [birth year] [id number]  Subjective:    Tina [name] is an 49 y.o. year old female who is a primary patient of Tina Rob, MD.    [name] was given information about Medicaid Managed Care team care coordination services today. Tina [name] agreed to services and verbal consent obtained  Review of patient status, laboratory and other test data was performed as part of evaluation for provision of services.  SDOH:   SDOH Screenings   Alcohol Screen: Low Risk    Last Alcohol Screening Score (AUDIT): 1  Depression (PHQ2-9): Medium Risk   PHQ-2 Score: 15  Financial Resource Strain: Medium Risk   Difficulty of Paying Living Expenses: Somewhat hard  Food Insecurity: No Food Insecurity   Worried About Charity fundraiser in the Last Year: Never true   Ran Out of Food in the Last Year: Never true  Housing: Low Risk    Last Housing Risk Score: 0  Physical Activity: Inactive   Days of Exercise per Week: 0 days   Minutes of Exercise per Session: 0 min  Social Connections: Moderately Isolated   Frequency of Communication with Friends and Family: More than three times a week   Frequency of Social Gatherings with Friends and Family: More than three times a week   Attends Religious Services: More than 4 times per year   Active Member of [employer] or Organizations: No   Attends Music therapist: Never   Marital Status: Never married  Stress: Stress Concern Present   Feeling of Stress : Very much  Tobacco Use: High Risk   Smoking Tobacco Use: Current Every Day Smoker   Smokeless Tobacco Use: Never Used  Transportation Needs: No Transportation Needs   Lack of Transportation (Medical): No   Lack of Transportation (Non-Medical): No     Objective:    Medications:  Medications Reviewed Today    Reviewed by [name], LCSW (Social Worker)  on 08/16/20 at 1337  Med List Status: <None>  Medication Order Taking? Sig Documenting Provider Last Dose Status Informant  albuterol (PROVENTIL HFA;VENTOLIN HFA) 108 (90 Base) MCG/ACT inhaler [id number]  Inhale 1-2 puffs into the lungs every 6 (six) hours as needed for wheezing or shortness of breath. Petrucelli, [name], PA-C  Active   atorvastatin (LIPITOR) 40 MG tablet [id number]  Take 1 tablet (40 mg total) by mouth daily. Welford Roche, MD  Active   Blood Glucose Monitoring Suppl (CONTOUR NEXT ONE) KIT [id number]  1 each by Does not apply route See admin instructions. USE TO CHECK BLOOD GLUCOSE ONCE DAILY. IM PROGRAM. Tina Leff, MD  Active   busPIRone (BUSPAR) 7.5 MG tablet [id number] Yes Take 1 tablet by mouth 3 (three) times daily. [provider] Taking Active   canagliflozin (INVOKANA) 100 MG TABS tablet [id number]  Take 1 tablet (100 mg total) by mouth daily before breakfast. [name], MD  Active   cyclobenzaprine (FEXMID) 7.5 MG tablet [id number]  Take 1 tablet (7.5 mg total) by mouth 3 (three) times daily as needed for muscle spasms. Marcelyn Bruins, MD  Active   enalapril (VASOTEC) 20 MG tablet [id number]  TAKE 1 TABLET (20 MG TOTAL) BY MOUTH DAILY. Andrew Au, MD  Active   gabapentin (NEURONTIN) 300 MG capsule [id number]  Take 1 capsule (300 mg total) by mouth 3 (three) times daily for 3 days, THEN 2 capsules (600 mg total) 3 (three) times daily. Christian, [name],  MD  Active   glucose blood (CONTOUR NEXT TEST) test strip [id number]  Please use to check your sugar 3 times a day. Welford Roche, MD  Active   hydrOXYzine (ATARAX/VISTARIL) 25 MG tablet [id number] Yes Take 25 mg by mouth 3 (three) times daily as needed. [provider] Taking Active   ibuprofen (ADVIL) 800 MG tablet [id number]  Take 1 tablet (800 mg total) by mouth every 8 (eight) hours as needed. [name], MD  Active   insulin aspart (NOVOLOG FLEXPEN) 100 UNIT/ML FlexPen  [id number]  Inject 10 Units into the skin 3 (three) times daily with meals. INJECT 10 UNITS INTO THE SKIN 3 TIMES DAILY WITH MEALS. [name], MD  Active   Insulin Glargine (LANTUS SOLOSTAR) 100 UNIT/ML Solostar Pen [id number]  Inject 35 Units into the skin daily at 10 pm. Welford Roche, MD  Active   Insulin Pen Needle (UNIFINE PENTIPS) 31G X 5 MM MISC [id number]  Inject 1 Units into the skin 3 (three) times daily. Use to inject insulin 4 times daily. IM PROGRAM Welford Roche, MD  Active   lamoTRIgine (LAMICTAL) 100 MG tablet [id number] Yes Take 100 mg by mouth 2 (two) times daily.  [provider] Taking Active   Lancets [place] [id number]  1 Units by Does not apply route 3 (three) times daily. Welford Roche, MD  Active   meloxicam (MOBIC) 15 MG tablet [id number]  Take 1 tablet (15 mg total) by mouth daily.  Patient not taking: Reported on 08/03/2020   Tina [name], DPM  Active   Multiple Vitamins-Minerals (MULTIVITAMIN WITH MINERALS) tablet [id number]  Take 1 tablet by mouth daily.   [provider]  Active   nystatin (NYSTATIN) powder [id number]  Apply topically 4 (four) times daily. Welford Roche, MD  Active   sertraline (ZOLOFT) 100 MG tablet [id number] Yes Take 100 mg by mouth daily. [provider] Taking Active   sitaGLIPtin-metformin (JANUMET) 50-1000 MG tablet [id number]  TAKE 1 TABLET BY MOUTH 2 TIMES DAILY WITH A MEAL. Welford Roche, MD  Active   traZODone (DESYREL) 50 MG tablet [id number] Yes Take 50 mg by mouth at bedtime. [provider] Taking Active           Fall/Depression Screening:  Fall Risk  08/08/2020 08/03/2020 07/30/2020  Falls in the past year? 1 1 0  Comment - Patient stated she fell about 2 months ago. -  Number falls in past yr: 0 - -  Comment - - -  Injury with Fall? 1 - -  Risk for fall due to : - - Impaired balance/gait;Impaired mobility  Follow up - - Falls evaluation completed   [facility]  2/9 Scores 08/08/2020 07/30/2020 10/10/2019 08/04/2019 04/14/2019 01/07/2019 04/13/2018  PHQ - 2 Score '6 6 5 6 6 4 1  ' PHQ- 9 Score '15 16 13 20 24 9 ' -  Exception Documentation - - - - - - -    Assessment:  Goals Addressed              This Visit's Progress     "I need resources to help pay for my diabetes medications." (pt-stated)        CARE PLAN ENTRY Medicaid Managed Care (see longitudinal plan of care for additional care plan information)  Current Barriers:   Community resources access barrier: Patient Lacks knowledge of community resource: to assist with medication obtaining medication such as the [employer].  Medication procurement  Financial constraints related to unemployment and  on disability and having no insurance. Patient stated she is not eligible for Medicaid due to disability payments being $24 over the limit for Medicaid.   Clinical Social Work Clinical Goal(s):   Over the next 7-14 days, patient will work with BSW to address concerns related to resources needed  Interventions:  Inter-disciplinary care team collaboration (see longitudinal plan of care)  Patient interviewed and appropriate assessments performed  Referred patient to community resources care guide team for assistance with resources to assist with patient getting her diabetes medications.  BSW will contact patient to discuss needs and available resources. BSW provided patient with the Medication Assistance Program resource [phone], to assist with getting her medications at no cost.  Patient Self Care Activities:   Patient will work with the Managed Medicaid team.  Licensed Clinical Social Worker will refer to [place] to assist with resources.  Please see past updates related to this goal by clicking on the "Past Updates" button in the selected goal       "I want to start outpatient therapy again." (pt-stated)        [place] (see longitudinal  plan of care for additional care plan information)  Current Barriers:   Patient needs assistance with appointment scheduling and follow up with [facility].  Mental Health Concerns - Patient struggles with depression but hasn't received therapy since June due to changes within the agency where she was receiving services at that time.   Patient stated that today (08/16/2020), she has taken the last pill for one of her depression medications.   Clinical Social Work Clinical Goal(s):   Over the next 7-14 days, patient will work with LCSW to address needs related to managing stress.  Patient stated she continues to work with her mother, who is experiencing decline. She stated a lot of things make her angry because she wants her mother to receive the best care as possible. She states she can't have time to herself because she is always caring for her mother. She states she has 5 siblings (a brother and a sister passed away). She states 2 brothers live locally but they don't help out as much as she does.  Over the next 14-30 days, patient will work with BSW to schedule appointment at [facility]). Patient states BSW called her while she was taking care of things for her mother, who receives in-home hospice care. However, she stated she was supposed to return her call but she doesn't have the number. She has not been scheduled for outpatient therapy at this time.    Interventions:  Inter-disciplinary care team collaboration (see longitudinal plan of care)  Patient interviewed and appropriate assessments performed  Referred patient to BSW for assistance with scheduling appointment for therapy and medication management.   Collaborated with BSW re: scheduling appointment at [facility] - [phone]. BSW scheduled patient an appointment at [facility] for 09/06/20 at 3:00PM. Patient Self Care Activities:   Patient will continue to take medications as  prescribed until she can be seen at [facility] will work with patient and provider to assist with scheduling therapy and medication management.   LCSW will follow-up in 14-30 days.  Please see past updates related to this goal by clicking on the "Past Updates" button in the selected goal        Plan: BSW will follow up with patient in 30 days.

## 2020-08-17 NOTE — Patient Instructions (Signed)
Visit Information Thank you for speaking with me today regarding care management and care coordination needs.  As a reminder your appointment at [facility] is on 09/06/20 at 3:00PM. They are located at [place], [place]. They can be reached at [phone].  Also, the Medication Assistance Program can assist with getting your medication at no cost to you. They can be reached at [phone].    Social Worker will follow up with patien in 30 days.Mickel Fuchs, BSW, [place]  High Risk Managed Medicaid Team

## 2020-08-17 NOTE — Addendum Note (Signed)
Addended by: Gilberto Better K on: 08/17/2020 05:00 PM   Modules accepted: Orders

## 2020-08-17 NOTE — Progress Notes (Signed)
Received request for refill on her sertraline. Refill sent

## 2020-08-23 ENCOUNTER — Other Ambulatory Visit: Payer: Self-pay

## 2020-08-23 ENCOUNTER — Other Ambulatory Visit: Payer: Self-pay | Admitting: Obstetrics and Gynecology

## 2020-08-23 NOTE — Patient Outreach (Signed)
Care Coordination - Case Manager  08/23/2020  Sarah Baez [facility] [birth year] [id number]  Subjective:  Tina [name] is an 49 y.o. year old female who is a primary patient of [name], MD.  Ms. Kirley was given information about Medicaid Managed Care team care coordination services today. Jodi Marble Ohm agreed to services and verbal consent obtained  Review of patient status, laboratory and other test data was performed as part of evaluation for provision of services.  SDOH: SDOH Screenings   Alcohol Screen: Low Risk   . Last Alcohol Screening Score (AUDIT): 1  Depression (PHQ2-9): Medium Risk  . PHQ-2 Score: 15  Financial Resource Strain: Medium Risk  . Difficulty of Paying Living Expenses: Somewhat hard  Food Insecurity: No Food Insecurity  . Worried About Charity fundraiser in the Last Year: Never true  . Ran Out of Food in the Last Year: Never true  Housing: Low Risk   . Last Housing Risk Score: 0  Physical Activity: Inactive  . Days of Exercise per Week: 0 days  . Minutes of Exercise per Session: 0 min  Social Connections: Moderately Isolated  . Frequency of Communication with Friends and Family: More than three times a week  . Frequency of Social Gatherings with Friends and Family: More than three times a week  . Attends Religious Services: More than 4 times per year  . Active Member of Clubs or Organizations: No  . Attends Archivist Meetings: Never  . Marital Status: Never married  Stress: Stress Concern Present  . Feeling of Stress : Very much  Tobacco Use: High Risk  . Smoking Tobacco Use: Current Every Day Smoker  . Smokeless Tobacco Use: Never Used  Transportation Needs: No Transportation Needs  . Lack of Transportation (Medical): No  . Lack of Transportation (Non-Medical): No     Objective:    Allergies  Allergen Reactions  . Codeine Other (See Comments)    hallucinations    Medications:     Medications Reviewed Today    Reviewed by Gayla Medicus, RN (Registered Nurse) on 08/23/20 at [phone]  Med List Status: <None>  Medication Order Taking? Sig Documenting Provider Last Dose Status Informant  albuterol (PROVENTIL HFA;VENTOLIN HFA) 108 (90 Base) MCG/ACT inhaler [id number] No Inhale 1-2 puffs into the lungs every 6 (six) hours as needed for wheezing or shortness of breath. Petrucelli, [name], PA-C Taking Active   atorvastatin (LIPITOR) 40 MG tablet [id number] No Take 1 tablet (40 mg total) by mouth daily. Welford Roche, MD Taking Active   Blood Glucose Monitoring Suppl (CONTOUR NEXT ONE) KIT [id number] No 1 each by Does not apply route See admin instructions. USE TO CHECK BLOOD GLUCOSE ONCE DAILY. IM PROGRAM. [name], MD Taking Active   busPIRone (BUSPAR) 7.5 MG tablet [id number] No Take 1 tablet by mouth 3 (three) times daily. [provider] Taking Active   canagliflozin (INVOKANA) 100 MG TABS tablet [id number]  Take 1 tablet (100 mg total) by mouth daily before breakfast. [name], MD  Active   cyclobenzaprine (FEXMID) 7.5 MG tablet [id number] No Take 1 tablet (7.5 mg total) by mouth 3 (three) times daily as needed for muscle spasms. Marcelyn Bruins, MD Taking Active   enalapril (VASOTEC) 20 MG tablet [id number] No TAKE 1 TABLET (20 MG TOTAL) BY MOUTH DAILY. Andrew Au, MD Taking Active   gabapentin (NEURONTIN) 300 MG capsule [id number] No Take 1 capsule (300 mg total) by mouth 3 (three) times  daily for 3 days, THEN 2 capsules (600 mg total) 3 (three) times daily. Mitzi Hansen, MD Taking Active   glucose blood (CONTOUR NEXT TEST) test strip [id number] No Please use to check your sugar 3 times a day. Welford Roche, MD Taking Active   hydrOXYzine (ATARAX/VISTARIL) 25 MG tablet [id number] No Take 25 mg by mouth 3 (three) times daily as needed. [provider] Taking Active   ibuprofen (ADVIL) 800 MG tablet [id number] No Take 1 tablet  (800 mg total) by mouth every 8 (eight) hours as needed. [name], MD Taking Active   insulin aspart (NOVOLOG FLEXPEN) 100 UNIT/ML FlexPen [id number]  Inject 10 Units into the skin 3 (three) times daily with meals. INJECT 10 UNITS INTO THE SKIN 3 TIMES DAILY WITH MEALS. [name], MD  Active   Insulin Glargine (LANTUS SOLOSTAR) 100 UNIT/ML Solostar Pen [id number] No Inject 35 Units into the skin daily at 10 pm. Welford Roche, MD Taking Active   Insulin Pen Needle (UNIFINE PENTIPS) 31G X 5 MM MISC [id number] No Inject 1 Units into the skin 3 (three) times daily. Use to inject insulin 4 times daily. IM PROGRAM Welford Roche, MD Taking Active   lamoTRIgine (LAMICTAL) 100 MG tablet [id number] No Take 100 mg by mouth 2 (two) times daily.  [provider] Taking Active   Lancets MISC [id number] No 1 Units by Does not apply route 3 (three) times daily. Welford Roche, MD Taking Active   meloxicam (MOBIC) 15 MG tablet [id number] No Take 1 tablet (15 mg total) by mouth daily.  Patient not taking: Reported on 08/03/2020   [name], DPM Not Taking Active   Multiple Vitamins-Minerals (MULTIVITAMIN WITH MINERALS) tablet [id number] No Take 1 tablet by mouth daily.   [provider] Taking Active   nystatin (NYSTATIN) powder [id number]  Apply topically 4 (four) times daily. Welford Roche, MD  Active   sertraline (ZOLOFT) 100 MG tablet [id number]  Take 1 tablet (100 mg total) by mouth daily. [name], MD  Active   sitaGLIPtin-metformin (JANUMET) 50-1000 MG tablet [id number] No TAKE 1 TABLET BY MOUTH 2 TIMES DAILY WITH A MEAL. Welford Roche, MD Taking Active   traZODone (DESYREL) 50 MG tablet [id number] No Take 50 mg by mouth at bedtime. [provider] Taking Active           Assessment:   Goals Addressed              This Visit's Progress   .  "I am stressed out, down" (pt-stated)        [place] (see longitudinal plan of care for additional care plan information)  Current Barriers:  . 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 . Over the next 30 days, patient will work with CM clinical social worker to address current issues.  Interventions:  . Inter-disciplinary care team collaboration (see longitudinal plan of care) . Advised patient to keep therapy appointment that is scheduled. Nash Dimmer with CSW, BSW regarding patient. . Discussed plans with patient for ongoing care management follow up and provided patient with direct contact information for care management team  Plan:  . Patient will attend therapy appointment scheduled. Marland Kitchen RNCM will follow up within 14 days.   Initial goal documentation     .  My sugars  are out of whack" (pt-stated)        [place] (see longitudinal plan of care for additional care plan information)  Current Barriers:  . Chronic Disease Management support and education needs related to DM . Film/video editor.  Nurse Case Manager Clinical Goal(s):  Marland Kitchen Over the next 30 days, patient will demonstrate improved adherence to prescribed treatment plan for DM as evidenced by checking blood sugars with glucometer  and continuing medications as prescribed.  Interventions:  . Inter-disciplinary care team collaboration (see longitudinal plan of care) . Advised patient to check blood sugars as recommended by provider. . Provided education to patient re: importance of checking blood sugars. . Discussed plans with patient for ongoing care management follow up and provided patient with direct contact information for care management team  Plan:  . Patient will check blood sugars with glucometer. Marland Kitchen RNCM will follow up with patient within 14 days.   Initial goal  documentation        Plan: RNCM will follow up with patient within 14 days.

## 2020-08-23 NOTE — Patient Instructions (Signed)
Hi Ms. Driskill, thank you for speaking with me today  Ms. Sunderlin was given information about Medicaid Managed Care team care coordination services and consented to engagement with the [facility] Managed Care team.   Goals Addressed              This Visit's Progress   .  "I am stressed out, down" (pt-stated)        [place] (see longitudinal plan of care for additional care plan information)  Current Barriers:  . 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 . Over the next 30 days, patient will work with CM clinical social worker to address current issues.  Interventions:  . Inter-disciplinary care team collaboration (see longitudinal plan of care) . Advised patient to keep therapy appointment that is scheduled. Nash Dimmer with CSW, BSW regarding patient. . Discussed plans with patient for ongoing care management follow up and provided patient with direct contact information for care management team  Plan:  . Patient will attend therapy appointment scheduled. Marland Kitchen RNCM will follow up within 14 days.   Initial goal documentation     .  My sugars are out of whack" (pt-stated)        [place] (see longitudinal plan of care for additional care plan information)  Current Barriers:  . Chronic Disease Management support and education needs related to DM . Film/video editor.  Nurse Case Manager Clinical Goal(s):  Marland Kitchen Over the next 30 days, patient will demonstrate improved adherence to prescribed treatment plan for DM as evidenced by checking blood sugars with glucometer  and continuing medications as prescribed.  Interventions:  . Inter-disciplinary care team collaboration (see longitudinal plan of care) . Advised patient to check blood sugars as recommended by  provider. . Provided education to patient re: importance of checking blood sugars. . Discussed plans with patient for ongoing care management follow up and provided patient with direct contact information for care management team  Plan:  . Patient will check blood sugars with glucometer. Marland Kitchen RNCM will follow up with patient within 14 days.   Initial goal documentation        The Managed Medicaid care management team will reach out to the patient again over the next 14 days.   Aida Raider RN, BSN [place]  [facility] [employer] - Managed Medicaid High Risk [phone]

## 2020-08-28 ENCOUNTER — Ambulatory Visit (INDEPENDENT_AMBULATORY_CARE_PROVIDER_SITE_OTHER): Payer: Medicaid Other | Admitting: Podiatry

## 2020-08-28 ENCOUNTER — Other Ambulatory Visit: Payer: Self-pay

## 2020-08-28 DIAGNOSIS — G629 Polyneuropathy, unspecified: Secondary | ICD-10-CM

## 2020-08-28 DIAGNOSIS — M19071 Primary osteoarthritis, right ankle and foot: Secondary | ICD-10-CM

## 2020-08-28 DIAGNOSIS — D492 Neoplasm of unspecified behavior of bone, soft tissue, and skin: Secondary | ICD-10-CM | POA: Diagnosis not present

## 2020-08-28 DIAGNOSIS — E1149 Type 2 diabetes mellitus with other diabetic neurological complication: Secondary | ICD-10-CM

## 2020-08-29 MED FILL — NOVOLOG FLEXPEN SYRINGE: 100 | 10 days supply | Qty: 3 | Fill #1

## 2020-08-29 MED FILL — LANTUS SOLOSTAR 100 UNITS/M: 100 | 25 days supply | Qty: 9 | Fill #9

## 2020-08-29 NOTE — Progress Notes (Signed)
Subjective: 49 year old female presents the office today for evaluation of chronic right ankle pain as well as her painful calluses to both feet.  States that now that the weather is getting colder she has been having some pain to her ankle.  She wears an ankle brace most the time in [place] brace intermittently.  She is on gabapentin.  She takes ibuprofen for pain.  A1c is greater than 14.  Still describing numbness and neuropathy symptoms to her feet.  Objective: AAO x3, NAD DP/PT pulses palpable bilaterally, CRT less than 3 seconds Hyperkeratotic tissue present left medial second toe and first interspace right foot.  No ulceration identified.  There is no erythema.  Chronic edema present although mild to the ankle joint.  Mild diffuse tenderness of the anterior ankle joint line.  No other areas of discomfort.  MMT 5/5.  Flatfoot is present bilaterally.  Significant bunion is present. No pain with calf compression, swelling, warmth, erythema.    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 -Debrided hyperkeratotic lesions x2 without any complications or bleeding. -Continue [place] brace/ankle brace for no.  Offered steroid injection but she declined. -Continue current medications including gabapentin and ibuprofen.  Return in about 4 weeks   [name] DPM

## 2020-09-06 ENCOUNTER — Other Ambulatory Visit: Payer: Self-pay | Admitting: Obstetrics and Gynecology

## 2020-09-06 ENCOUNTER — Other Ambulatory Visit: Payer: Self-pay

## 2020-09-06 ENCOUNTER — Ambulatory Visit (INDEPENDENT_AMBULATORY_CARE_PROVIDER_SITE_OTHER): Payer: No Payment, Other | Admitting: Behavioral Health

## 2020-09-06 DIAGNOSIS — F32A Depression, unspecified: Secondary | ICD-10-CM | POA: Diagnosis not present

## 2020-09-06 NOTE — Patient Outreach (Signed)
Care Coordination- Social Work  09/06/2020  Tina [name] [birth year] [id number]  Subjective:    Tina [name] is an 49 y.o. year old female who is a primary patient of [name], MD.    [name] was given information about Medicaid Managed Care team care coordination services today. Jodi Marble Cassedy agreed to services and verbal consent obtained  Review of patient status, laboratory and other test data was performed as part of evaluation for provision of services.  SDOH:   SDOH Screenings   Alcohol Screen: Low Risk   . Last Alcohol Screening Score (AUDIT): 1  Depression (PHQ2-9): Medium Risk  . PHQ-2 Score: 15  Financial Resource Strain: Medium Risk  . Difficulty of Paying Living Expenses: Somewhat hard  Food Insecurity: No Food Insecurity  . Worried About Charity fundraiser in the Last Year: Never true  . Ran Out of Food in the Last Year: Never true  Housing: Low Risk   . Last Housing Risk Score: 0  Physical Activity: Inactive  . Days of Exercise per Week: 0 days  . Minutes of Exercise per Session: 0 min  Social Connections: Moderately Isolated  . Frequency of Communication with Friends and Family: More than three times a week  . Frequency of Social Gatherings with Friends and Family: More than three times a week  . Attends Religious Services: More than 4 times per year  . Active Member of Clubs or Organizations: No  . Attends Archivist Meetings: Never  . Marital Status: Never married  Stress: Stress Concern Present  . Feeling of Stress : Very much  Tobacco Use: High Risk  . Smoking Tobacco Use: Current Every Day Smoker  . Smokeless Tobacco Use: Never Used  Transportation Needs: No Transportation Needs  . Lack of Transportation (Medical): No  . Lack of Transportation (Non-Medical): No     Objective:    Medications:  Medications Reviewed Today    Reviewed by Gayla Medicus, RN (Registered Nurse) on  09/06/20 at [phone]  Med List Status: <None>  Medication Order Taking? Sig Documenting Provider Last Dose Status Informant  albuterol (PROVENTIL HFA;VENTOLIN HFA) 108 (90 Base) MCG/ACT inhaler [id number] Yes Inhale 1-2 puffs into the lungs every 6 (six) hours as needed for wheezing or shortness of breath. Petrucelli, [name], PA-C Taking Active   atorvastatin (LIPITOR) 40 MG tablet [id number] Yes Take 1 tablet (40 mg total) by mouth daily. Welford Roche, MD Taking Active   Blood Glucose Monitoring Suppl (CONTOUR NEXT ONE) KIT [id number] Yes 1 each by Does not apply route See admin instructions. USE TO CHECK BLOOD GLUCOSE ONCE DAILY. IM PROGRAM. [name], MD Taking Active   busPIRone (BUSPAR) 7.5 MG tablet [id number] Yes Take 1 tablet by mouth 3 (three) times daily. [provider] Taking Active   canagliflozin (INVOKANA) 100 MG TABS tablet [id number] Yes Take 1 tablet (100 mg total) by mouth daily before breakfast. [name], MD Taking Active   cyclobenzaprine (FEXMID) 7.5 MG tablet [id number] Yes Take 1 tablet (7.5 mg total) by mouth 3 (three) times daily as needed for muscle spasms. Marcelyn Bruins, MD Taking Active   enalapril (VASOTEC) 20 MG tablet [id number] Yes TAKE 1 TABLET (20 MG TOTAL) BY MOUTH DAILY. Andrew Au, MD Taking Active   gabapentin (NEURONTIN) 300 MG capsule [id number] Yes Take 1 capsule (300 mg total) by mouth 3 (three) times daily for 3 days, THEN 2 capsules (600 mg total) 3 (three) times daily. Darrick Meigs,  [name], MD Taking Active   glucose blood (CONTOUR NEXT TEST) test strip [id number] Yes Please use to check your sugar 3 times a day. Welford Roche, MD Taking Active   hydrOXYzine (ATARAX/VISTARIL) 25 MG tablet [id number] Yes Take 25 mg by mouth 3 (three) times daily as needed. [provider] Taking Active   ibuprofen (ADVIL) 800 MG tablet [id number] Yes Take 1 tablet (800 mg total) by mouth every 8 (eight) hours as needed. [name], MD Taking Active   insulin aspart (NOVOLOG FLEXPEN) 100 UNIT/ML FlexPen [id number] Yes Inject 10 Units into the skin 3 (three) times daily with meals. INJECT 10 UNITS INTO THE SKIN 3 TIMES DAILY WITH MEALS. [name], MD Taking Active   Insulin Glargine (LANTUS SOLOSTAR) 100 UNIT/ML Solostar Pen [id number] Yes Inject 35 Units into the skin daily at 10 pm. Welford Roche, MD Taking Active   Insulin Pen Needle (UNIFINE PENTIPS) 31G X 5 MM MISC [id number] Yes Inject 1 Units into the skin 3 (three) times daily. Use to inject insulin 4 times daily. IM PROGRAM Welford Roche, MD Taking Active   lamoTRIgine (LAMICTAL) 100 MG tablet [id number] Yes Take 100 mg by mouth 2 (two) times daily.  [provider] Taking Active   Lancets MISC [id number] Yes 1 Units by Does not apply route 3 (three) times daily. Welford Roche, MD Taking Active   meloxicam (MOBIC) 15 MG tablet [id number]  Take 1 tablet (15 mg total) by mouth daily.  Patient not taking: Reported on 08/03/2020   [name], DPM  Active   Multiple Vitamins-Minerals (MULTIVITAMIN WITH MINERALS) tablet [id number]  Take 1 tablet by mouth daily.   [provider]  Active   nystatin (NYSTATIN) powder [id number] Yes Apply topically 4 (four) times daily. Welford Roche, MD Taking Active   sertraline (ZOLOFT) 100 MG tablet [id number] Yes Take 1 tablet (100 mg total) by mouth daily. [name], MD Taking Active   sitaGLIPtin-metformin [facility]) 50-1000 MG tablet [id number] Yes TAKE 1 TABLET BY MOUTH 2 TIMES DAILY WITH A MEAL. Welford Roche, MD Taking Active   traZODone (DESYREL) 50 MG tablet [id number] Yes Take 50 mg by mouth at bedtime. [provider] Taking Active           Fall/Depression Screening:  Fall Risk  08/08/2020 08/03/2020 07/30/2020  Falls in the past year? 1 1 0  Comment - Patient stated she fell about 2 months ago. -  Number falls in past yr: 0 - -  Comment - - -    Injury with Fall? 1 - -  Risk for fall due to : - - Impaired balance/gait;Impaired mobility  Follow up - - Falls evaluation completed   PHQ 2/9 Scores 08/08/2020 07/30/2020 10/10/2019 08/04/2019 04/14/2019 01/07/2019 04/13/2018  PHQ - 2 Score '6 6 5 6 6 4 1  ' PHQ- 9 Score '15 16 13 20 24 9 ' -  Exception Documentation - - - - - - -    Assessment:  Goals Addressed              This Visit's Progress   .  "I need resources to help pay for my diabetes medications." (pt-stated)   On track     [place] (see longitudinal plan of care for additional care plan information)  Current Barriers:  . Community resources access barrier: Patient Lacks knowledge of community resource: to assist with medication obtaining medication such as the [employer]. . Medication procurement .  Financial constraints related to unemployment and on disability and having no insurance. Patient stated she is not eligible for Medicaid due to disability payments being $24 over the limit for Medicaid.   Clinical Social Work Clinical Goal(s):  Marland Kitchen Over the next 7-14 days, patient will work with BSW to address concerns related to resources needed  Interventions: . Inter-disciplinary care team collaboration (see longitudinal plan of care) . Patient interviewed and appropriate assessments performed . Referred patient to community resources care guide team for assistance with resources to assist with patient getting her diabetes medications. . BSW will contact patient to discuss needs and available resources. BSW provided patient with the Medication Assistance Program resource [phone], to assist with getting her medications at no cost. . BSW contacted Dr. Gilberto Better to ask for a new prescription for Sertraline since patient has been without medication for 2 days. Dr. Truman Hayward will send prescription to [place] once system comes up. Marland Kitchen Update 09/06/2020: LCSW provided  information for patient to access Affordable Care Act at www.healthcare.gov as a possible resource for insurance.  Patient Self Care Activities:  . Patient will work with the Managed Medicaid team. . Licensed Clinical Social Worker will refer to BSW to assist with resources.  Please see past updates related to this goal by clicking on the "Past Updates" button in the selected goal     .  "I want to better control my diabetes." (pt-stated)   On track     [place] (see longitudinal plan of care for additional care plan information)  Current Barriers:  . Community resources access barrier: Patient Lacks knowledge of community resource: Patient does not have Medicaid or Medicare and doesn't know any resources to assist with obtaining her medication. . Social Isolation . Medication procurement . Financial constraints related to receiving disability check but having no insurance. . Limited social support  Clinical Social Work Clinical Goal(s):  Marland Kitchen Over the next 7-14 days, patient will work with SW to address concerns related to resources to assist with medication procurement. . Over the next 7-14 days, patient will work with RN CM to address needs related to healthy lifestyle and improving diabetes management .  Interventions: . Inter-disciplinary care team collaboration (see longitudinal plan of care) . Patient interviewed and appropriate assessments performed . Provided mental health counseling with regard to diabetes and depression. Patient stated she is unable to adequately care for her own health due to the current stressors of dealing with her terminally ill mother. She stated she wants to live and not die, but she is so overwhelmed.  Marland Kitchen Update 09/06/2020: Patient's insulin was recently increased. She is scheduled to see her doctor in December. . Advised patient to begin getting some form of exercise daily to help with both mental and physical  health. Nash Dimmer with RN Case Manager re: diabetes management . Collaborated with BSW re: resources to assist with medication procurement, including the [place]  Patient Self Care Activities:  . Patient will continue to take medications as prescribed. . RN Case Manager will work with patient to help her with lifestyle changes for help improvement. . Licensed Clinical Social Worker will work with patient for depression symptom management regarding diabetes. . BSW will work with patient regarding resources.   Please see past updates related to this goal by clicking on the "Past Updates" button in the selected goal     .  "I want to better control my high blood  pressure." (pt-stated)   On track     [place] (see longitudinal plan of care for additional care plan information)  Current Barriers:  Marland Kitchen Mental Health Concerns . Social Isolation . Limited social support  Clinical Social Work Clinical Goal(s):  Marland Kitchen Over the next 30 days, patient will work with SW to address concerns related to managing stress to decrease blood pressure . Over the next 30 days, patient will demonstrate improved health management independence as evidenced by self-reported decreased blood pressure reading  Interventions: . Inter-disciplinary care team collaboration (see longitudinal plan of care) . Patient interviewed and appropriate assessments performed . Provided mental health counseling with regard to high blood pressure and depression  . Provided patient with information about the negative effects of high blood pressure. Patient stated she gets headaches and does feel that her blood pressure is high. She stated she needs different treatment but cannot get it because she is not eligible for Medicaid due to receiving disability payments that amount to $24 over the limit.  Marland Kitchen Update 09/06/2020: Patient stated her blood pressure continues to fluctuate up and down. She still  takes her medication. She states she continues to deal with her mother, who is receiving Hospice care. . Discussed plans with patient for ongoing care management follow up and provided patient with direct contact information for care management team . Collaborated with RN Case Manager re: high blood pressure . Referred patient to RN CM for blood pressure management.  Patient Self Care Activities:  . Patient will continue to take her medication as prescribed. . RN Case Manager will contact patient to discuss blood pressure management.   Please see past updates related to this goal by clicking on the "Past Updates" button in the selected goal     .  "I want to start outpatient therapy again." (pt-stated)   On track     [place] (see longitudinal plan of care for additional care plan information)  Current Barriers:  . Patient needs assistance with appointment scheduling and follow up with [facility]. . Mental Health Concerns - Patient struggles with depression but hasn't received therapy since June due to changes within the agency where she was receiving services at that time.  . Patient stated that today (08/16/2020), she has taken the last pill for one of her depression medications.  Marland Kitchen Update 09/06/2020: Patient states she has a therapy appointment on 09/06/2020 @ 3:00pm.  Clinical Social Work Clinical Goal(s):  Marland Kitchen Over the next 7-14 days, patient will work with LCSW to address needs related to managing stress.  Patient stated she continues to work with her mother, who is experiencing decline. She stated a lot of things make her angry because she wants her mother to receive the best care as possible. She states she can't have time to herself because she is always caring for her mother. She states she has 5 siblings (a brother and a sister passed away). She states 2 brothers live locally but they don't help out as much as she  does. . Over the next 14-30 days, patient will work with BSW to schedule appointment at [facility]). Patient states BSW called her while she was taking care of things for her mother, who receives in-home hospice care. However, she stated she was supposed to return her call but she doesn't have the number. She has not been scheduled for outpatient therapy at this  time. . Update 09/06/2020: Patient scheduled for therapy at 09/06/2020 @ 3:00pm.    Interventions: . Inter-disciplinary care team collaboration (see longitudinal plan of care) . Patient interviewed and appropriate assessments performed . Referred patient to BSW for assistance with scheduling appointment for therapy and medication management.  Nash Dimmer with BSW re: scheduling appointment at [facility] - [phone]. BSW scheduled patient an appointment at [facility] for 09/06/20 at 3:00PM.  Patient Self Care Activities:  . Patient will continue to take medications as prescribed until she can be seen at [place] will work with patient and provider to assist with scheduling therapy and medication management.  Marland Kitchen LCSW will follow-up in 14-30 days.  Please see past updates related to this goal by clicking on the "Past Updates" button in the selected goal        Plan: LCSW will follow up in 14-30 days.  [name], BSW, MSW, LCSW Social Work Case Freight forwarder - [place]  [facility] [place]: [phone]

## 2020-09-06 NOTE — Patient Outreach (Signed)
Care Coordination - Case Manager  09/06/2020  Lakie Mclouth [facility] February 03, 1971 [id number]  Subjective:  Tina [name] is an 49 y.o. year old female who is a primary patient of [name], MD.  Ms. Parr was given information about Medicaid Managed Care team care coordination services today. Jodi Marble Lauture agreed to services and verbal consent obtained  Review of patient status, laboratory and other test data was performed as part of evaluation for provision of services.  SDOH: SDOH Screenings   Alcohol Screen: Low Risk   . Last Alcohol Screening Score (AUDIT): 1  Depression (PHQ2-9): Medium Risk  . PHQ-2 Score: 15  Financial Resource Strain: Medium Risk  . Difficulty of Paying Living Expenses: Somewhat hard  Food Insecurity: No Food Insecurity  . Worried About Charity fundraiser in the Last Year: Never true  . Ran Out of Food in the Last Year: Never true  Housing: Low Risk   . Last Housing Risk Score: 0  Physical Activity: Inactive  . Days of Exercise per Week: 0 days  . Minutes of Exercise per Session: 0 min  Social Connections: Moderately Isolated  . Frequency of Communication with Friends and Family: More than three times a week  . Frequency of Social Gatherings with Friends and Family: More than three times a week  . Attends Religious Services: More than 4 times per year  . Active Member of Clubs or Organizations: No  . Attends Archivist Meetings: Never  . Marital Status: Never married  Stress: Stress Concern Present  . Feeling of Stress : Very much  Tobacco Use: High Risk  . Smoking Tobacco Use: Current Every Day Smoker  . Smokeless Tobacco Use: Never Used  Transportation Needs: No Transportation Needs  . Lack of Transportation (Medical): No  . Lack of Transportation (Non-Medical): No     Objective:    Allergies  Allergen Reactions  . Codeine Other (See Comments)    hallucinations    Medications:      Medications Reviewed Today    Reviewed by Gayla Medicus, RN (Registered Nurse) on 09/06/20 at [phone]  Med List Status: <None>  Medication Order Taking? Sig Documenting Provider Last Dose Status Informant  albuterol (PROVENTIL HFA;VENTOLIN HFA) 108 (90 Base) MCG/ACT inhaler [id number] Yes Inhale 1-2 puffs into the lungs every 6 (six) hours as needed for wheezing or shortness of breath. Petrucelli, [name], PA-C Taking Active   atorvastatin (LIPITOR) 40 MG tablet [id number] Yes Take 1 tablet (40 mg total) by mouth daily. Welford Roche, MD Taking Active   Blood Glucose Monitoring Suppl (CONTOUR NEXT ONE) KIT [id number] Yes 1 each by Does not apply route See admin instructions. USE TO CHECK BLOOD GLUCOSE ONCE DAILY. IM PROGRAM. [name], MD Taking Active   busPIRone (BUSPAR) 7.5 MG tablet [id number] Yes Take 1 tablet by mouth 3 (three) times daily. [provider] Taking Active   canagliflozin (INVOKANA) 100 MG TABS tablet [id number] Yes Take 1 tablet (100 mg total) by mouth daily before breakfast. [name], MD Taking Active   cyclobenzaprine (FEXMID) 7.5 MG tablet [id number] Yes Take 1 tablet (7.5 mg total) by mouth 3 (three) times daily as needed for muscle spasms. Marcelyn Bruins, MD Taking Active   enalapril (VASOTEC) 20 MG tablet [id number] Yes TAKE 1 TABLET (20 MG TOTAL) BY MOUTH DAILY. Andrew Au, MD Taking Active   gabapentin (NEURONTIN) 300 MG capsule [id number] Yes Take 1 capsule (300 mg total) by mouth 3 (three)  times daily for 3 days, THEN 2 capsules (600 mg total) 3 (three) times daily. Mitzi Hansen, MD Taking Active   glucose blood (CONTOUR NEXT TEST) test strip [id number] Yes Please use to check your sugar 3 times a day. Welford Roche, MD Taking Active   hydrOXYzine (ATARAX/VISTARIL) 25 MG tablet [id number] Yes Take 25 mg by mouth 3 (three) times daily as needed. [provider] Taking Active   ibuprofen (ADVIL) 800 MG tablet  [id number] Yes Take 1 tablet (800 mg total) by mouth every 8 (eight) hours as needed. [name], MD Taking Active   insulin aspart (NOVOLOG FLEXPEN) 100 UNIT/ML FlexPen [id number] Yes Inject 10 Units into the skin 3 (three) times daily with meals. INJECT 10 UNITS INTO THE SKIN 3 TIMES DAILY WITH MEALS. [name], MD Taking Active   Insulin Glargine (LANTUS SOLOSTAR) 100 UNIT/ML Solostar Pen [id number] Yes Inject 35 Units into the skin daily at 10 pm. Welford Roche, MD Taking Active   Insulin Pen Needle (UNIFINE PENTIPS) 31G X 5 MM MISC [id number] Yes Inject 1 Units into the skin 3 (three) times daily. Use to inject insulin 4 times daily. IM PROGRAM Welford Roche, MD Taking Active   lamoTRIgine (LAMICTAL) 100 MG tablet [id number] Yes Take 100 mg by mouth 2 (two) times daily.  [provider] Taking Active   Lancets MISC [id number] Yes 1 Units by Does not apply route 3 (three) times daily. Welford Roche, MD Taking Active   meloxicam (MOBIC) 15 MG tablet [id number]  Take 1 tablet (15 mg total) by mouth daily.  Patient not taking: Reported on 08/03/2020   [name], DPM  Active   Multiple Vitamins-Minerals (MULTIVITAMIN WITH MINERALS) tablet [id number]  Take 1 tablet by mouth daily.   [provider]  Active   nystatin (NYSTATIN) powder [id number] Yes Apply topically 4 (four) times daily. Welford Roche, MD Taking Active   sertraline (ZOLOFT) 100 MG tablet [id number] Yes Take 1 tablet (100 mg total) by mouth daily. [name], MD Taking Active   sitaGLIPtin-metformin [facility]) 50-1000 MG tablet [id number] Yes TAKE 1 TABLET BY MOUTH 2 TIMES DAILY WITH A MEAL. Welford Roche, MD Taking Active   traZODone (DESYREL) 50 MG tablet [id number] Yes Take 50 mg by mouth at bedtime. [provider] Taking Active           Assessment:   Goals Addressed              This Visit's Progress   .  "I am stressed out, down" (pt-stated)         [place] (see longitudinal plan of care for additional care plan information)  Current Barriers:  . 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  Update:  Patient has appointment 09/06/20. Marland Kitchen Over the next 30 days, patient will work with CM clinical social worker to address current issues.  Update:  Patient has appointment 09/06/20  Interventions:  . Inter-disciplinary care team collaboration (see longitudinal plan of care) . Advised patient to keep therapy appointment that is scheduled. Nash Dimmer with CSW, BSW regarding patient. . Discussed plans with patient for ongoing care management follow up and provided patient with direct contact information for care management team  Plan:  . Patient will attend therapy appointment scheduled. Marland Kitchen RNCM will follow up within 14  days.   Please see past updates related to this goal by clicking on the "Past Updates" button in the selected goal     .  My sugars are out of whack" (pt-stated)        [place] (see longitudinal plan of care for additional care plan information)  Current Barriers:  . Chronic Disease Management support and education needs related to DM . Film/video editor.  Nurse Case Manager Clinical Goal(s):  Marland Kitchen Over the next 30 days, patient will demonstrate improved adherence to prescribed treatment plan for DM as evidenced by checking blood sugars with glucometer  and continuing medications as prescribed.  Update:  Patient states she is not checking her blood sugars-cannot fine glucometer.  Encouraged patient to follow up with her provider and/or purchase new one at [facility].  Patient states she can do this.  Interventions:  . Inter-disciplinary care team collaboration (see longitudinal plan of care) . Advised patient  to check blood sugars as recommended by provider. . Provided education to patient re: importance of checking blood sugars. . Discussed plans with patient for ongoing care management follow up and provided patient with direct contact information for care management team  Plan:  . Patient will check blood sugars with glucometer. Marland Kitchen RNCM will follow up with patient within 14 days.   Please see past updates related to this goal by clicking on the "Past Updates" button in the selected goal        Plan: RNCM will follow up with patient within 14 days.

## 2020-09-06 NOTE — Patient Instructions (Signed)
Hi Tina [name], thank you for speaking with me today.  Tina [name]  was given information about Medicaid Managed Care team care coordination services and consented to engagement with the [facility] Managed Care team.   Goals Addressed              This Visit's Progress     "I am stressed out, down" (pt-stated)        CARE PLAN ENTRY Medicaid Managed Care (see longitudinal plan of care for additional care plan information)  Current Barriers:   Patient is sole caregiver for her Mother in [place]  Nurse Case Manager Clinical Goal(s):   Over the next 30 days, patient will work with therapist to address needs related to stress.  Update:  Patient has appointment 09/06/20.  Over the next 30 days, patient will work with CM clinical social worker to address current issues.  Update:  Patient has appointment 09/06/20  Interventions:   Inter-disciplinary care team collaboration (see longitudinal plan of care)  Advised patient to keep therapy appointment that is scheduled.  Collaborated with CSW, BSW regarding patient.  Discussed plans with patient for ongoing care management follow up and provided patient with direct contact information for care management team  Plan:   Patient will attend therapy appointment scheduled.  RNCM will follow up within 14 days.   Please see past updates related to this goal by clicking on the "Past Updates" button in the selected goal       My sugars are out of whack" (pt-stated)        [place] (see longitudinal plan of care for additional care plan information)  Current Barriers:   Chronic Disease Management support and education needs related to DM  Financial Constraints.  Nurse Case Manager Clinical Goal(s):   Over the next 30 days, patient will demonstrate improved adherence to prescribed treatment plan for DM as evidenced by checking blood sugars with glucometer  and continuing medications as  prescribed.  Update:  Patient states she is not checking her blood sugars-cannot fine glucometer.  Encouraged patient to follow up with her provider and/or purchase new one at [facility].  Patient states she can do this.  Interventions:   Inter-disciplinary care team collaboration (see longitudinal plan of care)  Advised patient to check blood sugars as recommended by provider.  Provided education to patient re: importance of checking blood sugars.  Discussed plans with patient for ongoing care management follow up and provided patient with direct contact information for care management team  Plan:   Patient will check blood sugars with glucometer.  RNCM will follow up with patient within 14 days.   Please see past updates related to this goal by clicking on the "Past Updates" button in the selected goal        The Managed Medicaid care management team will reach out to the patient again over the next 14 days.   Aida Raider RN, BSN [place]   [facility] - Managed Medicaid High Risk [phone]

## 2020-09-06 NOTE — Patient Instructions (Signed)
Visit Information Ms. Seifert, it was a pleasure speaking with you today. If you have any questions or concerns, please contact me at [phone].  Ms. Rollo was given information about Medicaid Managed Care team care coordination services and consented to engagement with the [facility] Managed Care team.   Goals Addressed              This Visit's Progress   .  "I need resources to help pay for my diabetes medications." (pt-stated)   On track     [place] (see longitudinal plan of care for additional care plan information)  Current Barriers:  . Community resources access barrier: Patient Lacks knowledge of community resource: to assist with medication obtaining medication such as the [employer]. . Medication procurement . Financial constraints related to unemployment and on disability and having no insurance. Patient stated she is not eligible for Medicaid due to disability payments being $24 over the limit for Medicaid.   Clinical Social Work Clinical Goal(s):  Marland Kitchen Over the next 7-14 days, patient will work with BSW to address concerns related to resources needed  Interventions: . Inter-disciplinary care team collaboration (see longitudinal plan of care) . Patient interviewed and appropriate assessments performed . Referred patient to community resources care guide team for assistance with resources to assist with patient getting her diabetes medications. . BSW will contact patient to discuss needs and available resources. BSW provided patient with the Medication Assistance Program resource [phone], to assist with getting her medications at no cost. . BSW contacted Dr. Gilberto Better to ask for a new prescription for Sertraline since patient has been without medication for 2 days. Dr. Truman Hayward will send prescription to [facility] once system comes up. Marland Kitchen Update 09/06/2020: LCSW provided information for patient to access  Affordable Care Act at www.healthcare.gov as a possible resource for insurance.  Patient Self Care Activities:  . Patient will work with the Managed Medicaid team. . Licensed Clinical Social Worker will refer to BSW to assist with resources.  Please see past updates related to this goal by clicking on the "Past Updates" button in the selected goal     .  "I want to better control my diabetes." (pt-stated)   On track     [place] (see longitudinal plan of care for additional care plan information)  Current Barriers:  . Community resources access barrier: Patient Lacks knowledge of community resource: Patient does not have Medicaid or Medicare and doesn't know any resources to assist with obtaining her medication. . Social Isolation . Medication procurement . Financial constraints related to receiving disability check but having no insurance. . Limited social support  Clinical Social Work Clinical Goal(s):  Marland Kitchen Over the next 7-14 days, patient will work with SW to address concerns related to resources to assist with medication procurement. . Over the next 7-14 days, patient will work with RN CM to address needs related to healthy lifestyle and improving diabetes management .  Interventions: . Inter-disciplinary care team collaboration (see longitudinal plan of care) . Patient interviewed and appropriate assessments performed . Provided mental health counseling with regard to diabetes and depression. Patient stated she is unable to adequately care for her own health due to the current stressors of dealing with her terminally ill mother. She stated she wants to live and not die, but she is so overwhelmed.  Marland Kitchen Update 09/06/2020: Patient's insulin was recently increased. She is scheduled to see  her doctor in December. . Advised patient to begin getting some form of exercise daily to help with both mental and physical health. Nash Dimmer with RN Case Manager re:  diabetes management . Collaborated with BSW re: resources to assist with medication procurement, including the [place]  Patient Self Care Activities:  . Patient will continue to take medications as prescribed. . RN Case Manager will work with patient to help her with lifestyle changes for help improvement. . Licensed Clinical Social Worker will work with patient for depression symptom management regarding diabetes. . BSW will work with patient regarding resources.   Please see past updates related to this goal by clicking on the "Past Updates" button in the selected goal     .  "I want to better control my high blood pressure." (pt-stated)   On track     [place] (see longitudinal plan of care for additional care plan information)  Current Barriers:  Marland Kitchen Mental Health Concerns . Social Isolation . Limited social support  Clinical Social Work Clinical Goal(s):  Marland Kitchen Over the next 30 days, patient will work with SW to address concerns related to managing stress to decrease blood pressure . Over the next 30 days, patient will demonstrate improved health management independence as evidenced by self-reported decreased blood pressure reading  Interventions: . Inter-disciplinary care team collaboration (see longitudinal plan of care) . Patient interviewed and appropriate assessments performed . Provided mental health counseling with regard to high blood pressure and depression  . Provided patient with information about the negative effects of high blood pressure. Patient stated she gets headaches and does feel that her blood pressure is high. She stated she needs different treatment but cannot get it because she is not eligible for Medicaid due to receiving disability payments that amount to $24 over the limit.  Marland Kitchen Update 09/06/2020: Patient stated her blood pressure continues to fluctuate up and down. She still takes her medication. She states she continues to  deal with her mother, who is receiving Hospice care. . Discussed plans with patient for ongoing care management follow up and provided patient with direct contact information for care management team . Collaborated with RN Case Manager re: high blood pressure . Referred patient to RN CM for blood pressure management.  Patient Self Care Activities:  . Patient will continue to take her medication as prescribed. . RN Case Manager will contact patient to discuss blood pressure management.   Please see past updates related to this goal by clicking on the "Past Updates" button in the selected goal     .  "I want to start outpatient therapy again." (pt-stated)   On track     [place] (see longitudinal plan of care for additional care plan information)  Current Barriers:  . Patient needs assistance with appointment scheduling and follow up with [facility]. . Mental Health Concerns - Patient struggles with depression but hasn't received therapy since June due to changes within the agency where she was receiving services at that time.  . Patient stated that today (08/16/2020), she has taken the last pill for one of her depression medications.  Marland Kitchen Update 09/06/2020: Patient states she has a therapy appointment on 09/06/2020 @ 3:00pm.  Clinical Social Work Clinical Goal(s):  Marland Kitchen Over the next 7-14 days, patient will work with LCSW to address needs related to managing stress.  Patient stated she continues to work with  her mother, who is experiencing decline. She stated a lot of things make her angry because she wants her mother to receive the best care as possible. She states she can't have time to herself because she is always caring for her mother. She states she has 5 siblings (a brother and a sister passed away). She states 2 brothers live locally but they don't help out as much as she does. . Over the next 14-30 days, patient will work  with BSW to schedule appointment [facility] Patient states BSW called her while she was taking care of things for her mother, who receives in-home hospice care. However, she stated she was supposed to return her call but she doesn't have the number. She has not been scheduled for outpatient therapy at this time. Marland Kitchen Update 09/06/2020: Patient scheduled for therapy at 09/06/2020 @ 3:00pm.    Interventions: . Inter-disciplinary care team collaboration (see longitudinal plan of care) . Patient interviewed and appropriate assessments performed . Referred patient to BSW for assistance with scheduling appointment for therapy and medication management.  Nash Dimmer with BSW re: scheduling appointment at [facility] - [phone]. BSW scheduled patient an appointment at [facility] for 09/06/20 at 3:00PM.  Patient Self Care Activities:  . Patient will continue to take medications as prescribed until she can be seen at [place] will work with patient and provider to assist with scheduling therapy and medication management.  Marland Kitchen LCSW will follow-up in 14-30 days.  Please see past updates related to this goal by clicking on the "Past Updates" button in the selected goal        Patient verbalizes understanding of instructions provided today.   Licensed Clinical Social Worker will follow up on September 25, 2020 @ 1:00pm. The Managed Medicaid care management team will reach out to the patient again over the next 14 days.  The patient has been provided with contact information for the Managed Medicaid care management team and has been advised to call with any health related questions or concerns.    [name], BSW, MSW, LCSW Social Work Case Freight forwarder - [place]  Direct [place]: [phone]

## 2020-09-07 MED FILL — ATORVASTATIN 40 MG TABLET: 40 | 30 days supply | Qty: 30 | Fill #7

## 2020-09-07 MED FILL — JANUMET 50-1,000 MG TABLET: 50-1000 | 30 days supply | Qty: 60 | Fill #4

## 2020-09-07 MED FILL — INVOKANA 100 MG TABLET: 100 | 30 days supply | Qty: 30 | Fill #1

## 2020-09-07 NOTE — Progress Notes (Signed)
Comprehensive Clinical Assessment (CCA) Note  09/07/2020 Tina [name] [id number]  Tina [name] is a 49 year old female who presents to [facility] for a scheduled Comprehensive Clinical Assessment. Pt was initially receiving MH services through [place]. Pt voiced her frustrations with the sudden change in her provider. Pt reports she was referred to [facility] by [facility] case manager, [name], LCSW, to discuss possible therapy and medication management. Pt reports her current stressors to be her finances, managing her health, and caring for her elderly mother who is currently on Hospice care.  Pt reports she is currently out of the following prescribed medications: Buspar, Vistaril, Lamictal, and Trazodone. The only medication pt reports not being out of is her Zoloft. Pt is currently unemployed, receiving disability benefits, and lives with her brother. Pt has been diagnosed with diabetes and she walks with a cane due to severe swelling in her right foot. Pt was very tearful throughout assessment session as she talked about her current stressors. She reports having sxs of severe depression. Pt reports unhealthy eating habits due to her stressors; however, this effects her sugar levels.   Pt was alert and oriented x4 while pleasant and cooperative. She denied SI/HI/AVH. Pt did not appear to be responding to internal stimuli.  Recommendation: Medication Management and Outpatient Therapy   Chief Complaint:  Chief Complaint  Patient presents with   Depression   Visit Diagnosis: Depression    CCA Screening, Triage and Referral (STR)  Patient Reported Information How did you hear about [place]? Other (Comment)  Referral name: [name], LCSW  Referral phone number: No data recorded  Whom do you see for routine medical problems? I don't have a doctor  Practice/Facility Name: No data recorded Practice/Facility Phone Number: No data recorded Name of  Contact: No data recorded Contact Number: No data recorded Contact Fax Number: No data recorded Prescriber Name: No data recorded Prescriber Address (if known): No data recorded  What Is the Reason for Your Visit/Call Today? Medication Management; Therapy  How Long Has This Been Causing You Problems? > than 6 months  What Do You Feel Would Help You the Most Today? Medication;Therapy   Have You Recently Been in Any Inpatient Treatment (Hospital/Detox/Crisis Center/28-Day Program)? No  Name/Location of Program/Hospital:No data recorded How Long Were You There? No data recorded When Were You Discharged? No data recorded  Have You Ever Received Services From [facility] Before? No  Who Do You See at [facility]? No data recorded  Have You Recently Had Any Thoughts About Hurting Yourself? Yes (Passive thoughts of suicide. Never acted on these thoughts.)  Are You Planning to Commit Suicide/Harm Yourself At This time? No   Have you Recently Had Thoughts About [place]? No  Explanation: No data recorded  Have You Used Any Alcohol or Drugs in the Past 24 Hours? No data recorded How Long Ago Did You Use Drugs or Alcohol? No data recorded What Did You Use and How Much? No data recorded  Do You Currently Have a Therapist/Psychiatrist? No  Name of Therapist/Psychiatrist: No data recorded  Have You Been Recently Discharged From Any Office Practice or Programs? No  Explanation of Discharge From Practice/Program: No data recorded    CCA Screening Triage Referral Assessment Type of Contact: Face-to-Face  Is this Initial or Reassessment? No data recorded Date Telepsych consult ordered in [facility]:  No data recorded Time Telepsych consult ordered in [facility]:  No data recorded  Patient Reported Information Reviewed? Yes  Patient Left  Without Being Seen? No data recorded Reason for Not Completing Assessment: No data recorded  Collateral Involvement: No data recorded  Does  Patient Have a [place]? No data recorded Name and Contact of Legal Guardian: No data recorded If Minor and Not Living with Parent(s), Who has Custody? No data recorded Is CPS involved or ever been involved? Never  Is APS involved or ever been involved? Never   Patient Determined To Be At Risk for Harm To Self or Others Based on Review of Patient Reported Information or Presenting Complaint? No  Method: No data recorded Availability of Means: No data recorded Intent: No data recorded Notification Required: No data recorded Additional Information for Danger to Others Potential: No data recorded Additional Comments for Danger to Others Potential: No data recorded Are There Guns or Other Weapons in Your Home? No data recorded Types of Guns/Weapons: No data recorded Are These Weapons Safely Secured?                            No data recorded Who Could Verify You Are Able To Have These Secured: No data recorded Do You Have any Outstanding Charges, Pending Court Dates, Parole/Probation? No data recorded Contacted To Inform of Risk of Harm To Self or Others: No data recorded  Location of Assessment: [facility]   Does Patient Present under Involuntary Commitment? No  IVC Papers Initial File Date: No data recorded  [place] of Residence: [place]   Patient Currently Receiving the Following Services: Not Receiving Services   Determination of Need: Routine (7 days)   Options For Referral: Medication Management;Outpatient Therapy     CCA Biopsychosocial  Intake/Chief Complaint:  Medication management and outpatient therapy   Patient Reported Schizophrenia/Schizoaffective Diagnosis in Past: No   Mental Health Symptoms Depression:  Change in energy/activity;Difficulty Concentrating;Fatigue;Weight gain/loss;Tearfulness;Sleep (too much or little);Hopelessness;Worthlessness;Increase/decrease in appetite;Irritability   Duration of Depressive  symptoms: Greater than two weeks   Mania:  N/A   Anxiety:   N/A   Psychosis:  None   Duration of Psychotic symptoms: No data recorded  Trauma:  N/A   Obsessions:  N/A   Compulsions:  N/A   Inattention:  N/A   Hyperactivity/Impulsivity:  N/A   Oppositional/Defiant Behaviors:  N/A   Emotional Irregularity:  N/A   Other Mood/Personality Symptoms:  None    Mental Status Exam Appearance and self-care  Stature:  Tall   Weight:  Overweight   Clothing:  Neat/clean   Grooming:  Normal   Cosmetic use:  None   Posture/gait:  Normal   Motor activity:  Slowed   Sensorium  Attention:  Normal   Concentration:  Normal   Orientation:  X5   Recall/memory:  Normal   Affect and Mood  Affect:  Tearful;Depressed   Mood:  Depressed   Relating  Eye contact:  Normal   Facial expression:  Responsive   Attitude toward examiner:  Cooperative   Thought and Language  Speech flow: Clear and Coherent   Thought content:  Appropriate to Mood and Circumstances   Preoccupation:  None   Hallucinations:  None   Organization:  No data recorded  [employer] of Knowledge:  Average   Intelligence:  Average   Abstraction:  Normal   Judgement:  Normal   Reality Testing:  Adequate   Insight:  Fair   Decision Making:  Normal   Social Functioning  Social Maturity:  Responsible  Social Judgement:  Normal   Stress  Stressors:  Grief/losses;Family conflict;Illness;Financial   Coping Ability:  Deficient supports;Exhausted   Skill Deficits:  None   Supports:  Family;Friends/Service system      Religion: Religion/Spirituality Are You A Religious Person?: Yes What is Your Religious Affiliation?: Christian How Might This Affect Treatment?: None  Leisure/Recreation: Leisure / Recreation Do You Have Hobbies?: No  Exercise/Diet: Exercise/Diet Do You Exercise?: No Have You Gained or Lost A Significant Amount of Weight in the Past Six Months?:  Yes-Lost Number of Pounds Lost?: 10 (Pt states she loss 10lbs in 1 week) Do You Follow a Special Diet?: No Do You Have Any Trouble Sleeping?: Yes Explanation of Sleeping Difficulties: Difficulties getting to sleep and staying asleep   CCA Employment/Education  Employment/Work Situation: Employment / Work Situation Employment situation: On disability How long has patient been on disability: 2 years What is the longest time patient has a held a job?: 10 years Where was the patient employed at that time?: [employer] Has patient ever been in the [employer]?: No  Education: Education Is Patient Currently Attending School?: No Last Grade Completed: 12 Did Teacher, adult education From [employer]?: Yes Did Physicist, medical?: Yes What Type of College Degree Do you Have?: 1 Year of [place] Did You Attend Graduate School?: No Did You Have Any Special Interests In School?: None Did You Have An Individualized Education Program (IIEP): No Did You Have Any Difficulty At School?: No Patient's Education Has Been Impacted by Current Illness: No   CCA Family/Childhood History  Family and Relationship History: Family history Marital status: Single Are you sexually active?: No What is your sexual orientation?: "I'm gay" Has your sexual activity been affected by drugs, alcohol, medication, or emotional stress?: UKN Does patient have children?: No  Childhood History:  Childhood History By whom was/is the patient raised?: Both parents Additional childhood history information: Pt reports her mother's health is declining and she is her sole caretaker. Pt reports her mother is currently under hospice care. Description of patient's relationship with caregiver when they were a child: Pt reported having a close relationship with her mother and father. Patient's description of current relationship with people who raised him/her: "Good" How were you disciplined when you got in trouble as a child/adolescent?:  UKN Does patient have siblings?: Yes Number of Siblings: 7 Description of patient's current relationship with siblings: Pt is experiencing some discord with her siblings because she reports they are not helping her with their mother. She shared that this causes them to have arguments. Did patient suffer any verbal/emotional/physical/sexual abuse as a child?: No Did patient suffer from severe childhood neglect?: No Has patient ever been sexually abused/assaulted/raped as an adolescent or adult?: No Was the patient ever a victim of a crime or a disaster?: No Witnessed domestic violence?: No Has patient been affected by domestic violence as an adult?: No  Child/Adolescent Assessment: N/A     CCA Substance Use  Alcohol/Drug Use: Alcohol / Drug Use Pain Medications: See MAR Prescriptions: See MAR Over the Counter: See MAR History of alcohol / drug use?: No history of alcohol / drug abuse Longest period of sobriety (when/how long): None Reported Negative Consequences of Use:  (None Reported) Withdrawal Symptoms:  (None Reported)     Substance use Disorder (SUD)    Recommendations for Services/Supports/Treatments: Recommendations for Services/Supports/Treatments Recommendations For Services/Supports/Treatments: Medication Management, Individual Therapy  DSM5 Diagnoses: Patient Active Problem List   Diagnosis Date Noted   Pain in both feet  10/10/2019   Fibroids 04/14/2019   Financial difficulties 04/13/2018   Seasonal allergies 02/06/2017   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

## 2020-09-17 ENCOUNTER — Other Ambulatory Visit: Payer: Self-pay

## 2020-09-17 NOTE — Patient Outreach (Signed)
Care Coordination- Social Work  09/17/2020  Tina [name] 1971-07-10 [id number]  Subjective:    Tina [name] is an 49 y.o. year old female who is a primary patient of Tina Rob, MD.    [name] was given information about Medicaid Managed Care team care coordination services today. Jodi Marble Moren agreed to services and verbal consent obtained  Review of patient status, laboratory and other test data was performed as part of evaluation for provision of services.  SDOH:   SDOH Screenings   Alcohol Screen: Low Risk   . Last Alcohol Screening Score (AUDIT): 1  Depression (PHQ2-9): Medium Risk  . PHQ-2 Score: 15  Financial Resource Strain: Medium Risk  . Difficulty of Paying Living Expenses: Somewhat hard  Food Insecurity: No Food Insecurity  . Worried About Charity fundraiser in the Last Year: Never true  . Ran Out of Food in the Last Year: Never true  Housing: Low Risk   . Last Housing Risk Score: 0  Physical Activity: Inactive  . Days of Exercise per Week: 0 days  . Minutes of Exercise per Session: 0 min  Social Connections: Moderately Isolated  . Frequency of Communication with Friends and Family: More than three times a week  . Frequency of Social Gatherings with Friends and Family: More than three times a week  . Attends Religious Services: More than 4 times per year  . Active Member of Clubs or Organizations: No  . Attends Archivist Meetings: Never  . Marital Status: Never married  Stress: Stress Concern Present  . Feeling of Stress : Very much  Tobacco Use: High Risk  . Smoking Tobacco Use: Current Every Day Smoker  . Smokeless Tobacco Use: Never Used  Transportation Needs: No Transportation Needs  . Lack of Transportation (Medical): No  . Lack of Transportation (Non-Medical): No     Objective:    Medications:  Medications Reviewed Today    Reviewed by Gayla Medicus, RN (Registered Nurse) on  09/06/20 at [phone]  Med List Status: <None>  Medication Order Taking? Sig Documenting Provider Last Dose Status Informant  albuterol (PROVENTIL HFA;VENTOLIN HFA) 108 (90 Base) MCG/ACT inhaler [id number] Yes Inhale 1-2 puffs into the lungs every 6 (six) hours as needed for wheezing or shortness of breath. Petrucelli, [name], PA-C Taking Active   atorvastatin (LIPITOR) 40 MG tablet [id number] Yes Take 1 tablet (40 mg total) by mouth daily. Welford Roche, MD Taking Active   Blood Glucose Monitoring Suppl (CONTOUR NEXT ONE) KIT [id number] Yes 1 each by Does not apply route See admin instructions. USE TO CHECK BLOOD GLUCOSE ONCE DAILY. IM PROGRAM. [name], MD Taking Active   busPIRone (BUSPAR) 7.5 MG tablet [id number] Yes Take 1 tablet by mouth 3 (three) times daily. [provider] Taking Active   canagliflozin (INVOKANA) 100 MG TABS tablet [id number] Yes Take 1 tablet (100 mg total) by mouth daily before breakfast. [name], MD Taking Active   cyclobenzaprine (FEXMID) 7.5 MG tablet [id number] Yes Take 1 tablet (7.5 mg total) by mouth 3 (three) times daily as needed for muscle spasms. Marcelyn Bruins, MD Taking Active   enalapril (VASOTEC) 20 MG tablet [id number] Yes TAKE 1 TABLET (20 MG TOTAL) BY MOUTH DAILY. Andrew Au, MD Taking Active   gabapentin (NEURONTIN) 300 MG capsule [id number] Yes Take 1 capsule (300 mg total) by mouth 3 (three) times daily for 3 days, THEN 2 capsules (600 mg total) 3 (three) times daily. Darrick Meigs,  [name], MD Taking Active   glucose blood (CONTOUR NEXT TEST) test strip [id number] Yes Please use to check your sugar 3 times a day. Welford Roche, MD Taking Active   hydrOXYzine (ATARAX/VISTARIL) 25 MG tablet [id number] Yes Take 25 mg by mouth 3 (three) times daily as needed. [provider] Taking Active   ibuprofen (ADVIL) 800 MG tablet [id number] Yes Take 1 tablet (800 mg total) by mouth every 8 (eight) hours as needed. [name], MD Taking Active   insulin aspart (NOVOLOG FLEXPEN) 100 UNIT/ML FlexPen [id number] Yes Inject 10 Units into the skin 3 (three) times daily with meals. INJECT 10 UNITS INTO THE SKIN 3 TIMES DAILY WITH MEALS. [name], MD Taking Active   Insulin Glargine (LANTUS SOLOSTAR) 100 UNIT/ML Solostar Pen [id number] Yes Inject 35 Units into the skin daily at 10 pm. Welford Roche, MD Taking Active   Insulin Pen Needle (UNIFINE PENTIPS) 31G X 5 MM MISC [id number] Yes Inject 1 Units into the skin 3 (three) times daily. Use to inject insulin 4 times daily. IM PROGRAM Welford Roche, MD Taking Active   lamoTRIgine (LAMICTAL) 100 MG tablet [id number] Yes Take 100 mg by mouth 2 (two) times daily.  [provider] Taking Active   Lancets MISC [id number] Yes 1 Units by Does not apply route 3 (three) times daily. Welford Roche, MD Taking Active   meloxicam (MOBIC) 15 MG tablet [id number]  Take 1 tablet (15 mg total) by mouth daily.  Patient not taking: Reported on 08/03/2020   [name], DPM  Active   Multiple Vitamins-Minerals (MULTIVITAMIN WITH MINERALS) tablet [id number]  Take 1 tablet by mouth daily.   [provider]  Active   nystatin (NYSTATIN) powder [id number] Yes Apply topically 4 (four) times daily. Welford Roche, MD Taking Active   sertraline (ZOLOFT) 100 MG tablet [id number] Yes Take 1 tablet (100 mg total) by mouth daily. [name], MD Taking Active   sitaGLIPtin-metformin [facility]) 50-1000 MG tablet [id number] Yes TAKE 1 TABLET BY MOUTH 2 TIMES DAILY WITH A MEAL. Welford Roche, MD Taking Active   traZODone (DESYREL) 50 MG tablet [id number] Yes Take 50 mg by mouth at bedtime. [provider] Taking Active           Fall/Depression Screening:  Fall Risk  08/08/2020 08/03/2020 07/30/2020  Falls in the past year? 1 1 0  Comment - Patient stated she fell about 2 months ago. -  Number falls in past yr: 0 - -  Comment - - -   Injury with Fall? 1 - -  Risk for fall due to : - - Impaired balance/gait;Impaired mobility  Follow up - - Falls evaluation completed   PHQ 2/9 Scores 08/08/2020 07/30/2020 10/10/2019 08/04/2019 04/14/2019 01/07/2019 04/13/2018  PHQ - 2 Score _0 PHQ- 9 Score _1 -  Exception Documentation - - - - - - -    Plan: Patient will continue therapy services.

## 2020-09-17 NOTE — Patient Instructions (Signed)
Visit Information Thank you for speaking with me today regarding care management and care coordination needs.  If you need any additional information I can be reached at [phone].  Mickel Fuchs, BSW, [employer]  High Risk Managed Medicaid Team

## 2020-09-19 ENCOUNTER — Telehealth: Payer: Self-pay | Admitting: Internal Medicine

## 2020-09-19 NOTE — Telephone Encounter (Signed)
Attempted to reach Tina [name] to schedule her with the Managed Medicaid Pharmacist. I left my name and my direct number for her to return my call.

## 2020-09-20 ENCOUNTER — Other Ambulatory Visit: Payer: Self-pay

## 2020-09-20 NOTE — Patient Outreach (Signed)
Care Coordination  09/20/2020  Tina [name] Oct 21, 1971 [id number]  An unsuccessful telephone outreach was attempted today. The patient was referred to the case management team for assistance with care management and care coordination.   Follow Up Plan: The Managed Medicaid care management team will reach out to the patient again over the next 7 days.   Aida Raider RN, BSN [place]  [facility] Curator - Managed Medicaid High Risk [phone]

## 2020-09-20 NOTE — Patient Instructions (Signed)
HI Tina [name] we missed you today  - as a part of your Medicaid benefit, you are eligible for care management and care coordination services at no cost or copay. I was unable to reach you by phone today but would be happy to help you with your health related needs. Please feel free to call me at [phone].  A member of the Managed Medicaid care management team will reach out to you again over the next 7 days.   Aida Raider RN, BSN [place]   [facility]ator - Managed Medicaid High Risk [phone]

## 2020-09-25 ENCOUNTER — Ambulatory Visit: Payer: MEDICAID | Admitting: Podiatry

## 2020-09-25 ENCOUNTER — Other Ambulatory Visit: Payer: Self-pay

## 2020-09-25 MED FILL — ENALAPRIL MALEATE 20 MG TAB: 20 | 30 days supply | Qty: 30 | Fill #2

## 2020-09-25 MED FILL — LANTUS SOLOSTAR 100 UNITS/M: 100 | 25 days supply | Qty: 9 | Fill #10

## 2020-09-25 NOTE — Patient Instructions (Signed)
Visit Information  Ms. [employer]  - as a part of your Medicaid benefit, you are eligible for care management and care coordination services at no cost or copay. I was unable to reach you by phone today but would be happy to help you with your health related needs. Please feel free to call me @ [phone].   A member of the Managed Medicaid care management team will reach out to you again over the next 30 days.   [name], BSW, MSW, LCSW Social Work Case Freight forwarder - [place]  Direct [place]: [phone]

## 2020-09-25 NOTE — Patient Outreach (Signed)
Care Coordination  09/25/2020  Tatiana Courter Fischman 19-Sep-1971 [id number]  An unsuccessful telephone outreach was attempted today. The patient was referred to the case management team for assistance with care management and care coordination.   Follow Up Plan: The Managed Medicaid care management team will reach out to the patient again over the next 30 days.   [name], BSW, MSW, LCSW Social Work Case Freight forwarder - [place]  Direct [place]: [phone]

## 2020-09-26 MED FILL — NOVOLOG FLEXPEN SYRINGE: 100 | 10 days supply | Qty: 3 | Fill #2

## 2020-10-03 ENCOUNTER — Other Ambulatory Visit: Payer: Self-pay | Admitting: Obstetrics and Gynecology

## 2020-10-03 ENCOUNTER — Other Ambulatory Visit: Payer: Self-pay

## 2020-10-03 NOTE — Patient Outreach (Signed)
Care Coordination  10/03/2020  Tina [name] May 16, 1971 [id number]   RNCM called to speak with patient at appointment time.  Patient requested that I call back at another time as the Hospice nurse was in to see her Mother. RNCM will reschedule appointment at patient's request.  Aida Raider RN, BSN [place] Management Coordinator - Managed [place] High Risk [phone]

## 2020-10-04 ENCOUNTER — Other Ambulatory Visit: Payer: Self-pay

## 2020-10-04 ENCOUNTER — Telehealth (INDEPENDENT_AMBULATORY_CARE_PROVIDER_SITE_OTHER): Payer: Medicaid Other | Admitting: Psychiatry

## 2020-10-04 ENCOUNTER — Encounter (HOSPITAL_COMMUNITY): Payer: Self-pay | Admitting: Psychiatry

## 2020-10-04 ENCOUNTER — Other Ambulatory Visit: Payer: Self-pay | Admitting: Psychiatry

## 2020-10-04 DIAGNOSIS — F3181 Bipolar II disorder: Secondary | ICD-10-CM | POA: Diagnosis not present

## 2020-10-04 DIAGNOSIS — F411 Generalized anxiety disorder: Secondary | ICD-10-CM | POA: Diagnosis not present

## 2020-10-04 DIAGNOSIS — F333 Major depressive disorder, recurrent, severe with psychotic symptoms: Secondary | ICD-10-CM | POA: Insufficient documentation

## 2020-10-04 MED ORDER — LAMOTRIGINE 25 MG PO TABS: 25.0000 mg | ORAL_TABLET | Freq: Every day | ORAL | 1 refills | Status: DC

## 2020-10-04 MED ORDER — BUSPIRONE HCL 10 MG PO TABS: 10.0000 mg | ORAL_TABLET | Freq: Three times a day (TID) | ORAL | 2 refills | Status: DC

## 2020-10-04 MED ORDER — SERTRALINE HCL 50 MG PO TABS: 50.0000 mg | ORAL_TABLET | Freq: Every day | ORAL | 2 refills | Status: DC

## 2020-10-04 MED ORDER — HYDROXYZINE HCL 25 MG PO TABS: 25.0000 mg | ORAL_TABLET | Freq: Three times a day (TID) | ORAL | 3 refills | Status: DC

## 2020-10-04 MED ORDER — TRAZODONE HCL 50 MG PO TABS: 50.0000 mg | ORAL_TABLET | Freq: Every evening | ORAL | 2 refills | Status: DC | PRN

## 2020-10-04 NOTE — Progress Notes (Signed)
Psychiatric Initial Adult Assessment  Virtual Visit via Telephone Note  I connected with Tina [name] on 10/04/20 at 11:40 AM EST by telephone and verified that I am speaking with the correct person using two identifiers.  Location: Patient: home Provider: Clinic   I discussed the limitations, risks, security and privacy concerns of performing an evaluation and management service by telephone and the availability of in person appointments. I also discussed with the patient that there may be a patient responsible charge related to this service. The patient expressed understanding and agreed to proceed.   I provided 45 minutes of non-face-to-face time during this encounter.   Patient Identification: Tina [name] MRN:  [id number] Date of Evaluation:  10/04/2020 Referral Source: [name], MD Chief Complaint:  "I'm bitter sweet"  Visit Diagnosis:    ICD-10-CM   1. Severe recurrent major depression with psychotic features (HCC)  F33.3 busPIRone (BUSPAR) 10 MG tablet    sertraline (ZOLOFT) 50 MG tablet  2. Bipolar 2 disorder, major depressive episode (HCC)  F31.81 lamoTRIgine (LAMICTAL) 25 MG tablet    traZODone (DESYREL) 50 MG tablet  3. Generalized anxiety disorder  F41.1 hydrOXYzine (ATARAX/VISTARIL) 25 MG tablet    sertraline (ZOLOFT) 50 MG tablet    History of Present Illness: 49 year old female seen today for initial psychiatric evaluation.  She is a former patient of [facility] but was referred to outpatient psychiatry by her PCP.  She has a psychiatric history of anxiety, depression, and bipolar 2 disorder.  She is currently managed on Lamictal 200 mg twice daily, trazodone 100 mg nightly, Zoloft 100 mg daily, hydroxazine 25 mg 3 times daily as needed, and BuSpar 7.5 mg 3 times daily.  Patient noted that since she stopped receiving care at [facility] she has not been on her medications.  She informed provider that she is having increased anxiety and  depression.  Today patient unable to log on virtually so the call was done over the phone. During assessment she was pleasant, calm, cooperative, and engaged in conversation.  She informed provider that her mood was bittersweet.  She noted that her mother is in hospice care, her brother recently died of Covid, and she is stressed with caring for her mother.  She noted that her other siblings are not helpful when he comes down to the care of their mother.  She informed provider that this irritates her and noted that she constantly argues with her siblings.  Today provider conducted a GAD-7 and patient scored a 21.  She endorses feeling on edge, nervousness, restlessness, and feeling like something awful might happen.  A GAD-7 was also conducted and patient scored a 23.  She endorsed having a depressed mood, anhedonia, insomnia (noting she sleeps 2-3 hours a night), psychomotor agitation poor concentration, decreased energy, weight loss, and decreased appetite.  She also endorses symptoms of mania such as distractibility, irritability, fluctuations in mood, and racing thoughts. She endorses VAH noting that she sees and hears her deceased brother. She denies symptoms of paranoia.  She is agreeable to restarting her medication.  She will restart and increase it to 10 mg 3 times daily for anxiety.  She will also restart Zoloft at 50 mg daily, Lamictal 25 mg for 2 weeks and then increase to 50 mg, trazodone 50 mg nightly, and hydroxyzine 25 mg 3 times daily as needed for anxiety. Potential side effects of medication and risks vs benefits of treatment vs non-treatment were explained and discussed. All questions were answered. She  will follow up with outpatient counseling for therapy. No other concerns noted at this time.      Associated Signs/Symptoms: Depression Symptoms:  depressed mood, anhedonia, insomnia, psychomotor agitation, fatigue, feelings of worthlessness/guilt, difficulty  concentrating, hopelessness, anxiety, loss of energy/fatigue, weight loss, decreased appetite, (Hypo) Manic Symptoms:  Distractibility, Elevated Mood, Flight of Ideas, Irritable Mood, Anxiety Symptoms:  Excessive Worry, Psychotic Symptoms:  Hallucinations: Auditory Visual PTSD Symptoms: NA  Past Psychiatric History: anxiety, depression, and bipolar 2 disorder  Previous Psychotropic Medications: Buspar, Trazodone, hydroxyzine, zoloft, and lamictal   Substance Abuse History in the last 12 months:  No.  Consequences of Substance Abuse: NA  Past Medical History:  Past Medical History:  Diagnosis Date  . Diabetes mellitus    type II  . Fibroids   . HTN (hypertension)   . Hyperlipidemia   . Iron deficiency anemia   . Left acetabular fracture ([place])   . Lumbar strain   . Obesity   . Ovarian cyst     Past Surgical History:  Procedure Laterality Date  . FOOT ARTHRODESIS, MODIFIED MCBRIDE     right foot bunion surgery and ankle surgery 1980s  . 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 Colon cancer Sister   . Diabetes Mother   . Diabetes Father   . Diabetes Brother   . Celiac disease Paternal Aunt     Social History:   Social History   Socioeconomic History  . Marital status: Single    Spouse name: Not on file  . Number of children: 0  . Years of education: 55  . Highest education level: Some college, no degree  Occupational History  . Occupation: Engineer, materials: UNEMPLOYED    Employer: [employer], [employer]  Tobacco Use  . Smoking status: Current Every Day Smoker    Packs/day: 0.50    Years: 23.00    Pack years: 11.50    Types: Cigarettes  . Smokeless tobacco: Never Used  . Tobacco comment: 1/2PPD  Vaping Use  . Vaping Use: Never used  Substance and Sexual Activity  . Alcohol use: Yes    Alcohol/week: 0.0 standard  drinks    Comment: occ  . Drug use: No  . Sexual activity: Yes    Partners: Female    Birth control/protection: Injection    Comment: Depo  Other Topics Concern  . Not on file  Social History Narrative   Homosexual; in a monogamous relationship w/ homosexual woman.   She is disabled.  She last worked in 2018 at [employer].    Right handed   One story home   Social Determinants of Health   Financial Resource Strain: Medium Risk  . Difficulty of Paying Living Expenses: Somewhat hard  Food Insecurity: No Food Insecurity  . Worried About Charity fundraiser in the Last Year: Never true  . Ran Out of Food in the Last Year: Never true  Transportation Needs: No Transportation Needs  . Lack of Transportation (Medical): No  . Lack of Transportation (Non-Medical): No  Physical Activity: Inactive  . Days of Exercise per Week: 0 days  . Minutes of Exercise per Session: 0 min  Stress: Stress Concern Present  . Feeling of Stress : Very much  Social Connections: Moderately Isolated  . Frequency of Communication with Friends and Family: More than three times  a week  . Frequency of Social Gatherings with Friends and Family: More than three times a week  . Attends Religious Services: More than 4 times per year  . Active Member of Clubs or Organizations: No  . Attends Archivist Meetings: Never  . Marital Status: Never married    Additional Social History: Patient resides in [place]. She is single and has no children. She informed provider that she smokes a pack of cigarettes a day. She is currently unemployed and on disability. She denies alcohol or illegal drug use.   Allergies:   Allergies  Allergen Reactions  . Codeine Other (See Comments)    hallucinations    Metabolic Disorder Labs: Lab Results  Component Value Date   HGBA1C >14.0 (A) 07/30/2020   MPG 200 05/25/2017   MPG 237 02/26/2016   No results found for: PROLACTIN Lab Results  Component Value Date   CHOL  154 08/08/2020   TRIG 142 08/08/2020   HDL 35 (L) 08/08/2020   CHOLHDL 4.4 08/08/2020   VLDL 50 (H) 08/21/2014   LDLCALC 94 08/08/2020   [place] 96 01/07/2019   Lab Results  Component Value Date   TSH 1.074 06/27/2011    Therapeutic Level Labs: No results found for: LITHIUM No results found for: CBMZ No results found for: VALPROATE  Current Medications: Current Outpatient Medications  Medication Sig Dispense Refill  . albuterol (PROVENTIL HFA;VENTOLIN HFA) 108 (90 Base) MCG/ACT inhaler Inhale 1-2 puffs into the lungs every 6 (six) hours as needed for wheezing or shortness of breath. 1 Inhaler 0  . atorvastatin (LIPITOR) 40 MG tablet Take 1 tablet (40 mg total) by mouth daily. 30 tablet 11  . Blood Glucose Monitoring Suppl (CONTOUR NEXT ONE) KIT 1 each by Does not apply route See admin instructions. USE TO CHECK BLOOD GLUCOSE ONCE DAILY. IM PROGRAM. 1 kit 0  . busPIRone (BUSPAR) 10 MG tablet Take 1 tablet (10 mg total) by mouth 3 (three) times daily. 90 tablet 2  . canagliflozin (INVOKANA) 100 MG TABS tablet Take 1 tablet (100 mg total) by mouth daily before breakfast. 90 tablet 1  . cyclobenzaprine (FEXMID) 7.5 MG tablet Take 1 tablet (7.5 mg total) by mouth 3 (three) times daily as needed for muscle spasms. 90 tablet 0  . enalapril (VASOTEC) 20 MG tablet TAKE 1 TABLET (20 MG TOTAL) BY MOUTH DAILY. 90 tablet 3  . gabapentin (NEURONTIN) 300 MG capsule Take 1 capsule (300 mg total) by mouth 3 (three) times daily for 3 days, THEN 2 capsules (600 mg total) 3 (three) times daily. 549 capsule 0  . glucose blood (CONTOUR NEXT TEST) test strip Please use to check your sugar 3 times a day. 100 strip 3  . hydrOXYzine (ATARAX/VISTARIL) 25 MG tablet Take 1 tablet (25 mg total) by mouth 3 (three) times daily. 90 tablet 3  . ibuprofen (ADVIL) 800 MG tablet Take 1 tablet (800 mg total) by mouth every 8 (eight) hours as needed. 40 tablet 0  . insulin aspart (NOVOLOG FLEXPEN) 100 UNIT/ML FlexPen  Inject 10 Units into the skin 3 (three) times daily with meals. INJECT 10 UNITS INTO THE SKIN 3 TIMES DAILY WITH MEALS. 3 mL 3  . Insulin Glargine (LANTUS SOLOSTAR) 100 UNIT/ML Solostar Pen Inject 35 Units into the skin daily at 10 pm. 5 pen 6  . Insulin Pen Needle (UNIFINE PENTIPS) 31G X 5 MM MISC Inject 1 Units into the skin 3 (three) times daily. Use to inject insulin 4  times daily. IM PROGRAM 130 each 5  . lamoTRIgine (LAMICTAL) 25 MG tablet Take 1 tablet (25 mg total) by mouth daily. 70 tablet 1  . Lancets MISC 1 Units by Does not apply route 3 (three) times daily. 100 each 3  . meloxicam (MOBIC) 15 MG tablet Take 1 tablet (15 mg total) by mouth daily. (Patient not taking: Reported on 08/03/2020) 30 tablet 0  . Multiple Vitamins-Minerals (MULTIVITAMIN WITH MINERALS) tablet Take 1 tablet by mouth daily.      Marland Kitchen nystatin (NYSTATIN) powder Apply topically 4 (four) times daily. 15 g 3  . sertraline (ZOLOFT) 50 MG tablet Take 1 tablet (50 mg total) by mouth daily. 30 tablet 2  . sitaGLIPtin-metformin (JANUMET) 50-1000 MG tablet TAKE 1 TABLET BY MOUTH 2 TIMES DAILY WITH A MEAL. 60 tablet 12  . traZODone (DESYREL) 50 MG tablet Take 1 tablet (50 mg total) by mouth at bedtime as needed for sleep. 30 tablet 2   No current facility-administered medications for this visit.    Musculoskeletal: Strength & Muscle Tone: Unable to assess due to telephone visit Gait & Station: Unable to assess due to telephone visit Patient leans: N/A  Psychiatric Specialty Exam: Review of Systems  There were no vitals taken for this visit.There is no height or weight on file to calculate BMI.  General Appearance: Unable to assess due to telephone visit  Eye Contact:  Unable to assess due to telephone visit  Speech:  Clear and Coherent and Normal Rate  Volume:  Normal  Mood:  Anxious and Depressed  Affect:  Appropriate and Congruent  Thought Process:  Coherent, Goal Directed and Linear  Orientation:  Full (Time,  Place, and Person)  Thought Content:  Logical and Hallucinations: Auditory Visual  Suicidal Thoughts:  No  Homicidal Thoughts:  No  Memory:  Immediate;   Good Recent;   Good Remote;   Good  Judgement:  Good  Insight:  Good  Psychomotor Activity:  Normal  Concentration:  Concentration: Good and Attention Span: Good  Recall:  Good  Fund of Knowledge:Good  Language: Good  Akathisia:  No  Handed:  Right  AIMS (if indicated): Not done  Assets:  Communication Skills Desire for Improvement Financial Resources/Insurance Housing Leisure Time Social Support  ADL's:  Intact  Cognition: WNL  Sleep:  Poor   Screenings: GAD-7     Video Visit from 10/04/2020 in [place] from 11/17/2016 in [place] for [facility] Office Visit from 06/12/2016 in [place] for [facility] [place]  Total GAD-7 Score _0 [id number]     Video Visit from 10/04/2020 in [facility] Office Visit from 08/08/2020 in [place] Office Visit from 07/30/2020 in [place] Office Visit from 10/10/2019 in [place] Office Visit from 08/04/2019 in [place]  PHQ-2 Total Score _1 PHQ-9 Total Score _2 Assessment and Plan: Patient endorses symptoms of anxiety, depression, insomnia, and hypomania. She is agreeable to restarting Lamictal. She will start 25 mg for 2 weeks and then increase to 50 mg. She will also restart BuSpar and is agreeable to increase it to 10 mg 3 times daily. She will also restart trazodone at 50 mg daily, Zoloft 50 mg daily, and hydroxyzine 10 mg 3 times daily.  1. Severe recurrent major depression  with psychotic features ([place])  Restart- sertraline (ZOLOFT) 50 MG tablet; Take 1 tablet (50 mg total) by mouth daily.  Dispense: 30 tablet; Refill: 2 Restart/increased- busPIRone  (BUSPAR) 10 MG tablet; Take 1 tablet (10 mg total) by mouth 3 (three) times daily.  Dispense: 90 tablet; Refill: 2  2. Bipolar 2 disorder, major depressive episode ([place])  Restart- traZODone (DESYREL) 50 MG tablet; Take 1 tablet (50 mg total) by mouth at bedtime as needed for sleep.  Dispense: 30 tablet; Refill: 2 Restart- lamoTRIgine (LAMICTAL) 25 MG tablet; Take 1 tablet (25 mg total) by mouth daily.  Dispense: 70 tablet; Refill: 1  3. Generalized anxiety disorder  Restart- sertraline (ZOLOFT) 50 MG tablet; Take 1 tablet (50 mg total) by mouth daily.  Dispense: 30 tablet; Refill: 2 Restart- hydrOXYzine (ATARAX/VISTARIL) 25 MG tablet; Take 1 tablet (25 mg total) by mouth 3 (three) times daily.  Dispense: 90 tablet; Refill: 3  Follow up in 1 month Follow up with therapy  Salley Slaughter, NP 12/2/20213:07 PM

## 2020-10-05 ENCOUNTER — Other Ambulatory Visit: Payer: Self-pay

## 2020-10-05 ENCOUNTER — Other Ambulatory Visit (HOSPITAL_COMMUNITY): Payer: Self-pay | Admitting: Internal Medicine

## 2020-10-05 MED ORDER — UNIFINE PENTIPS 31G X 5 MM MISC
1.0000 [IU] | Freq: Three times a day (TID) | 5 refills | Status: DC
Start: 2020-10-05 — End: 2021-06-26

## 2020-10-05 MED FILL — traZODone HCL 50 MG TABS: 50 | 30 days supply | Qty: 30 | Fill #0

## 2020-10-05 MED FILL — UNIFINE PENTIPS 31GX3/16: 31G X 5 MM | 25 days supply | Qty: 100 | Fill #0

## 2020-10-05 MED FILL — lamoTRIgine 25 MG TABS: 25 | 30 days supply | Qty: 30 | Fill #0

## 2020-10-05 MED FILL — busPIRone HCL 10 MG TABS: 10 | 30 days supply | Qty: 90 | Fill #0

## 2020-10-05 MED FILL — hydrOXYzine HCL 25 MG TABS: 25 | 30 days supply | Qty: 90 | Fill #0

## 2020-10-05 NOTE — Telephone Encounter (Signed)
REQUEST PEN NEEDLE FOR HER INSULIN.   PLEASE CALL PT BACK

## 2020-10-09 ENCOUNTER — Ambulatory Visit (HOSPITAL_COMMUNITY): Payer: No Payment, Other | Admitting: Behavioral Health

## 2020-10-10 ENCOUNTER — Telehealth (HOSPITAL_COMMUNITY): Payer: No Payment, Other | Admitting: Psychiatry

## 2020-10-11 ENCOUNTER — Other Ambulatory Visit: Payer: Self-pay

## 2020-10-11 ENCOUNTER — Other Ambulatory Visit: Payer: Self-pay | Admitting: Internal Medicine

## 2020-10-11 ENCOUNTER — Other Ambulatory Visit: Payer: Self-pay | Admitting: Obstetrics and Gynecology

## 2020-10-11 DIAGNOSIS — F411 Generalized anxiety disorder: Secondary | ICD-10-CM

## 2020-10-11 DIAGNOSIS — E1165 Type 2 diabetes mellitus with hyperglycemia: Secondary | ICD-10-CM

## 2020-10-11 DIAGNOSIS — F3181 Bipolar II disorder: Secondary | ICD-10-CM

## 2020-10-11 DIAGNOSIS — F333 Major depressive disorder, recurrent, severe with psychotic symptoms: Secondary | ICD-10-CM

## 2020-10-11 MED ORDER — BUSPIRONE HCL 10 MG PO TABS: 10.0000 mg | ORAL_TABLET | Freq: Three times a day (TID) | ORAL | 2 refills | Status: DC

## 2020-10-11 MED ORDER — SERTRALINE HCL 50 MG PO TABS: 50.0000 mg | ORAL_TABLET | Freq: Every day | ORAL | 2 refills | Status: DC

## 2020-10-11 MED ORDER — NOVOLOG FLEXPEN 100 UNIT/ML ~~LOC~~ SOPN: 10.0000 [IU] | PEN_INJECTOR | Freq: Three times a day (TID) | SUBCUTANEOUS | 3 refills | Status: DC

## 2020-10-11 MED FILL — SERTRALINE HCL 50 MG TABLET: 50 | 30 days supply | Qty: 30 | Fill #0

## 2020-10-11 MED FILL — NOVOLOG FLEXPEN SYRINGE: 100 | 30 days supply | Qty: 9 | Fill #0

## 2020-10-11 NOTE — Patient Instructions (Signed)
Visit Information  Tina [name] was given information about Medicaid Managed Care team care coordination services and verbally consented to engagement with the [facility] Managed Care team.   There are no care plans to display for this patient.   Please see education materials related to DM provided as print materials.   Patient verbalizes understanding of instructions provided today.   The Managed Medicaid care management team will reach out to the patient again over the next 14 days.   Lane Hacker, [facility]    Hyperglycemia Hyperglycemia occurs when the level of sugar (glucose) in the blood is too high. Glucose is a type of sugar that provides the body's main source of energy. Certain hormones (insulin and glucagon) control the level of glucose in the blood. Insulin lowers blood glucose, and glucagon increases blood glucose. Hyperglycemia can result from having too little insulin in the bloodstream, or from the body not responding normally to insulin. Hyperglycemia occurs most often in people who have diabetes (diabetes mellitus), but it can happen in people who do not have diabetes. It can develop quickly, and it can be life-threatening if it causes you to become severely dehydrated (diabetic ketoacidosis or hyperglycemic hyperosmolar state). Severe hyperglycemia is a medical emergency. What are the causes? If you have diabetes, hyperglycemia may be caused by:  Diabetes medicine.  Medicines that increase blood glucose or affect your diabetes control.  Not eating enough, or not eating often enough.  Changes in physical activity level.  Being sick or having an infection. If you have prediabetes or undiagnosed diabetes:  Hyperglycemia may be caused by those conditions. If you do not have diabetes, hyperglycemia may be caused by:  Certain medicines, including steroid medicines, beta-blockers, epinephrine, and thiazide diuretics.  Stress.  Serious  illness.  Surgery.  Diseases of the pancreas.  Infection. What increases the risk? Hyperglycemia is more likely to develop in people who have risk factors for diabetes, such as:  Having a family member with diabetes.  Having a gene for type 1 diabetes that is passed from parent to child (inherited).  Living in an area with cold weather conditions.  Exposure to certain viruses.  Certain conditions in which the body's disease-fighting (immune) system attacks itself (autoimmune disorders).  Being overweight or obese.  Having an inactive (sedentary) lifestyle.  Having been diagnosed with insulin resistance.  Having a history of prediabetes, gestational diabetes, or polycystic ovarian syndrome (PCOS).  Being of American-Indian, African-American, Hispanic/Latino, or Asian/Pacific Islander descent. What are the signs or symptoms? Hyperglycemia may not cause any symptoms. If you do have symptoms, they may include early warning signs, such as:  Increased thirst.  Hunger.  Feeling very tired.  Needing to urinate more often than usual.  Blurry vision. Other symptoms may develop if hyperglycemia gets worse, such as:  Dry mouth.  Loss of appetite.  Fruity-smelling breath.  Weakness.  Unexpected or rapid weight gain or weight loss.  Tingling or numbness in the hands or feet.  Headache.  Skin that does not quickly return to normal after being lightly pinched and released (poor skin turgor).  Abdominal pain.  Cuts or bruises that are slow to heal. How is this diagnosed? Hyperglycemia is diagnosed with a blood test to measure your blood glucose level. This blood test is usually done while you are having symptoms. Your health care provider may also do a physical exam and review your medical history. You may have more tests to determine the cause of your hyperglycemia, such as:  A fasting blood glucose (FBG) test. You will not be allowed to eat (you will fast) for at  least 8 hours before a blood sample is taken.  An A1c (hemoglobin A1c) blood test. This provides information about blood glucose control over the previous 2-3 months.  An oral glucose tolerance test (OGTT). This measures your blood glucose at two times: ? After fasting. This is your baseline blood glucose level. ? Two hours after drinking a beverage that contains glucose. How is this treated? Treatment depends on the cause of your hyperglycemia. Treatment may include:  Taking medicine to regulate your blood glucose levels. If you take insulin or other diabetes medicines, your medicine or dosage may be adjusted.  Lifestyle changes, such as exercising more, eating healthier foods, or losing weight.  Treating an illness or infection, if this caused your hyperglycemia.  Checking your blood glucose more often.  Stopping or reducing steroid medicines, if these caused your hyperglycemia. If your hyperglycemia becomes severe and it results in hyperglycemic hyperosmolar state, you must be hospitalized and given IV fluids. Follow these instructions at home:  General instructions  Take over-the-counter and prescription medicines only as told by your health care provider.  Do not use any products that contain nicotine or tobacco, such as cigarettes and e-cigarettes. If you need help quitting, ask your health care provider.  Limit alcohol intake to no more than 1 drink per day for nonpregnant women and 2 drinks per day for men. One drink equals 12 oz of beer, 5 oz of wine, or 1 oz of hard liquor.  Learn to manage stress. If you need help with this, ask your health care provider.  Keep all follow-up visits as told by your health care provider. This is important. Eating and drinking   Maintain a healthy weight.  Exercise regularly, as directed by your health care provider.  Stay hydrated, especially when you exercise, get sick, or spend time in hot temperatures.  Eat healthy foods, such  as: ? Lean proteins. ? Complex carbohydrates. ? Fresh fruits and vegetables. ? Low-fat dairy products. ? Healthy fats.  Drink enough fluid to keep your urine clear or pale yellow. If you have diabetes:  Make sure you know the symptoms of hyperglycemia.  Follow your diabetes management plan, as told by your health care provider. Make sure you: ? Take your insulin and medicines as directed. ? Follow your exercise plan. ? Follow your meal plan. Eat on time, and do not skip meals. ? Check your blood glucose as often as directed. Make sure to check your blood glucose before and after exercise. If you exercise longer or in a different way than usual, check your blood glucose more often. ? Follow your sick day plan whenever you cannot eat or drink normally. Make this plan in advance with your health care provider.  Share your diabetes management plan with people in your workplace, school, and household.  Check your urine for ketones when you are ill and as told by your health care provider.  Carry a medical alert card or wear medical alert jewelry. Contact a health care provider if:  Your blood glucose is at or above 240 mg/dL (13.3 mmol/L) for 2 days in a row.  You have problems keeping your blood glucose in your target range.  You have frequent episodes of hyperglycemia. Get help right away if:  You have difficulty breathing.  You have a change in how you think, feel, or act (mental status).  You have  nausea or vomiting that does not go away. These symptoms may represent a serious problem that is an emergency. Do not wait to see if the symptoms will go away. Get medical help right away. Call your local emergency services (911 in the [place] Do not drive yourself to the hospital. Summary  Hyperglycemia occurs when the level of sugar (glucose) in the blood is too high.  Hyperglycemia is diagnosed with a blood test to measure your blood glucose level. This blood test is usually done  while you are having symptoms. Your health care provider may also do a physical exam and review your medical history.  If you have diabetes, follow your diabetes management plan as told by your health care provider.  Contact your health care provider if you have problems keeping your blood glucose in your target range. This information is not intended to replace advice given to you by your health care provider. Make sure you discuss any questions you have with your health care provider. Document Revised: 07/07/2016 Document Reviewed: 07/07/2016 Elsevier Patient Education  [place].

## 2020-10-11 NOTE — Progress Notes (Signed)
Received message from managed medicaid team in regards to refills for this patient. Appropriate refills sent.

## 2020-10-11 NOTE — Patient Instructions (Addendum)
Hi Tina [name], thank you for speaking with me.  Tina [name] was given information about Medicaid Managed Care team care coordination services and verbally consented to engagement with the Medicaid Managed Care team.   Medicaid Managed Care (see longitudinal plan of care for additional care plan information)  Current Barriers:  . 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  Update:  Patient has appointment 09/06/20. Marland Kitchen Update 10/11/20: Patient had telephonic visit 10/04/20 with NP. Marland Kitchen Over the next 30 days, patient will work with CM clinical social worker to address current issues.  Update:  Patient has appointment 09/06/20 . Update 10/11/20:  Patient had telephonic visit 10/04/20 with NP.  Interventions:  . Inter-disciplinary care team collaboration (see longitudinal plan of care) . Advised patient to keep therapy appointment that is scheduled. Nash Dimmer with CSW, BSW regarding patient. . Discussed plans with patient for ongoing care management follow up and provided patient with direct contact information for care management team  Plan:  . Patient will attend therapy appointment scheduled.  Update 10/11/20: Patient had telephonic appointment 10/04/20 and has follow up appointment.  Patient restarted on medications. Marland Kitchen RNCM will follow up within 30 days.   Medicaid Managed Care (see longitudinal plan of care for additional care plan information)  Current Barriers:  . Chronic Disease Management support and education needs related to DM . Film/video editor.  Nurse Case Manager Clinical Goal(s):  Marland Kitchen Over the next 30 days, patient will demonstrate improved adherence to prescribed treatment plan for DM as evidenced by checking blood sugars with glucometer and continuing medications as prescribed.  Update:  Patient states she is not  checking her blood sugars-cannot fine glucometer.  Encouraged patient to follow up with her provider and/or purchase new one at [facility].  Patient states she can do this. Marland Kitchen Update 10/11/20:  Patient states she will purchase glucometer next week.  Interventions:  . Inter-disciplinary care team collaboration (see longitudinal plan of care) . Advised patient to check blood sugars as recommended by provider. . Provided education to patient re: importance of checking blood sugars. . Discussed plans with patient for ongoing care management follow up and provided patient with direct contact information for care management team  Plan:  . Patient will check blood sugars with glucometer. Marland Kitchen RNCM will follow up with patient within 30 days.  Patient verbalized understanding of instructions presented today.  The Managed Medicaid care management team will reach out to the patient again over the next 30 days.   Aida Raider RN, BSN [place]  [employer] Curator - Managed Medicaid High Risk [phone].

## 2020-10-16 ENCOUNTER — Ambulatory Visit (INDEPENDENT_AMBULATORY_CARE_PROVIDER_SITE_OTHER): Payer: Medicaid Other | Admitting: Podiatry

## 2020-10-16 ENCOUNTER — Other Ambulatory Visit: Payer: Self-pay

## 2020-10-16 DIAGNOSIS — E1149 Type 2 diabetes mellitus with other diabetic neurological complication: Secondary | ICD-10-CM | POA: Diagnosis not present

## 2020-10-16 DIAGNOSIS — M21619 Bunion of unspecified foot: Secondary | ICD-10-CM | POA: Diagnosis not present

## 2020-10-16 DIAGNOSIS — M19071 Primary osteoarthritis, right ankle and foot: Secondary | ICD-10-CM | POA: Diagnosis not present

## 2020-10-16 DIAGNOSIS — M2042 Other hammer toe(s) (acquired), left foot: Secondary | ICD-10-CM | POA: Diagnosis not present

## 2020-10-16 DIAGNOSIS — M2041 Other hammer toe(s) (acquired), right foot: Secondary | ICD-10-CM | POA: Diagnosis not present

## 2020-10-16 DIAGNOSIS — G629 Polyneuropathy, unspecified: Secondary | ICD-10-CM | POA: Diagnosis not present

## 2020-10-17 ENCOUNTER — Ambulatory Visit: Payer: Self-pay

## 2020-10-17 NOTE — Progress Notes (Signed)
Subjective: 49 year old female presents the office today for evaluation of chronic right ankle pain as well as her painful calluses to both feet.  She has no new concerns today.  She is on gabapentin.  She is not had her A1c rechecked.  Discussed chronic pain to both of her feet as well as neuropathy.  Objective: AAO x3, NAD DP/PT pulses palpable bilaterally, CRT less than 3 seconds Hyperkeratotic tissue present left medial second toe and first interspace right foot.  No ulceration identified.  There is no erythema.  Chronic edema present although mild to the ankle joint.  Mild diffuse tenderness of the anterior ankle joint line.  No other areas of discomfort.  MMT 5/5.  Flatfoot is present bilaterally.  Significant bunion is present. No pain with calf compression, swelling, warmth, erythema.    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 -Debrided hyperkeratotic lesions x2 without any complications or bleeding. -Also discussed in the future surgical correction for bunion and hammertoes but she does have her A1c under better control. -Continue [place] brace/ankle brace for no.  Offered steroid injection but she declined. -Continue current medications including gabapentin and ibuprofen.  Return in about 4 weeks   Tina [name] DPM

## 2020-10-18 ENCOUNTER — Other Ambulatory Visit: Payer: Self-pay

## 2020-10-18 NOTE — Patient Outreach (Signed)
Care Coordination  10/18/2020  [name] 12-09-1970 [id number]   Medicaid Managed Care   Unsuccessful Outreach Note  10/18/2020 Name: Tina [name] MRN: [id number] DOB: 21-Apr-1971  Referred by: [name], MD Reason for referral : High Risk Managed Medicaid (HR MM Unsuccessful Telephone Outreach)   An unsuccessful telephone outreach was attempted today. The patient was referred to the case management team for assistance with care management and care coordination.   Follow Up Plan: BSW will follow up in 30 days  Mickel Fuchs, BSW, [place] Medicaid Team

## 2020-10-18 NOTE — Patient Instructions (Signed)
Visit Information  Ms. [employer]  - as a part of your Medicaid benefit, you are eligible for care management and care coordination services at no cost or copay. I was unable to reach you by phone today but would be happy to help you with your health related needs. Please feel free to call me @ [phone].   A member of the Managed Medicaid care management team will reach out to you again over the next 30 days.   Mickel Fuchs, BSW, [place]  High Risk Managed Medicaid Team

## 2020-10-25 ENCOUNTER — Other Ambulatory Visit: Payer: Self-pay

## 2020-10-25 ENCOUNTER — Ambulatory Visit: Payer: Self-pay

## 2020-10-25 NOTE — Patient Outreach (Signed)
Care Coordination- Social Work  10/25/2020  Tina [name] Mar 21, 1971 [id number]  Subjective:    Tina [name] is an 49 y.o. year old female who is a primary patient of Tina Rob, MD.    [name] was given information about Medicaid Managed Care team care coordination services today. Jodi Marble Bour agreed to services and verbal consent obtained  Review of patient status, laboratory and other test data was performed as part of evaluation for provision of services.  SDOH:   SDOH Screenings   Alcohol Screen: Low Risk   . Last Alcohol Screening Score (AUDIT): 1  Depression (PHQ2-9): Medium Risk  . PHQ-2 Score: 17  Financial Resource Strain: High Risk  . Difficulty of Paying Living Expenses: Hard  Food Insecurity: No Food Insecurity  . Worried About Charity fundraiser in the Last Year: Never true  . Ran Out of Food in the Last Year: Never true  Housing: Low Risk   . Last Housing Risk Score: 0  Physical Activity: Inactive  . Days of Exercise per Week: 0 days  . Minutes of Exercise per Session: 0 min  Social Connections: Moderately Isolated  . Frequency of Communication with Friends and Family: More than three times a week  . Frequency of Social Gatherings with Friends and Family: More than three times a week  . Attends Religious Services: More than 4 times per year  . Active Member of Clubs or Organizations: No  . Attends Archivist Meetings: Never  . Marital Status: Never married  Stress: Stress Concern Present  . Feeling of Stress : Very much  Tobacco Use: High Risk  . Smoking Tobacco Use: Current Every Day Smoker  . Smokeless Tobacco Use: Never Used  Transportation Needs: No Transportation Needs  . Lack of Transportation (Medical): No  . Lack of Transportation (Non-Medical): No   SDOH Interventions   Flowsheet Row Most Recent Value  SDOH Interventions   Food Insecurity Interventions Intervention Not  Indicated  [Patient receives SNAP benefits.]  Financial Strain Interventions Intervention Not Indicated  Housing Interventions Intervention Not Indicated  Transportation Interventions Intervention Not Indicated  Depression Interventions/Treatment  Counseling, Currently on Treatment  [Patient has started outpatient therapy and medication management.]      Objective:    Medications:  Medications Reviewed Today    Reviewed by [name], LCSW (Social Worker) on 10/25/20 at 22  Med List Status: <None>  Medication Order Taking? Sig Documenting Provider Last Dose Status Informant  albuterol (PROVENTIL HFA;VENTOLIN HFA) 108 (90 Base) MCG/ACT inhaler [id number]  Inhale 1-2 puffs into the lungs every 6 (six) hours as needed for wheezing or shortness of breath.  Patient not taking: Reported on 10/11/2020   Amaryllis Dyke, PA-C  Active   atorvastatin (LIPITOR) 40 MG tablet [id number]  Take 1 tablet (40 mg total) by mouth daily. Welford Roche, MD  Active   Blood Glucose Monitoring Suppl (CONTOUR NEXT ONE) KIT [id number]  1 each by Does not apply route See admin instructions. USE TO CHECK BLOOD GLUCOSE ONCE DAILY. IM PROGRAM. [name], MD  Active   busPIRone (BUSPAR) 10 MG tablet [id number] Yes Take 1 tablet (10 mg total) by mouth 3 (three) times daily. [name], MD Taking Active   canagliflozin [facility]) 100 MG TABS tablet [id number]  Take 1 tablet (100 mg total) by mouth daily before breakfast. [name], MD  Active   cyclobenzaprine (FEXMID) 7.5 MG tablet [id number]  Take 1 tablet (7.5 mg total) by  mouth 3 (three) times daily as needed for muscle spasms.  Patient not taking: Reported on 10/11/2020   Marcelyn Bruins, MD  Active   enalapril (VASOTEC) 20 MG tablet [id number]  TAKE 1 TABLET (20 MG TOTAL) BY MOUTH DAILY. Andrew Au, MD  Active   gabapentin (NEURONTIN) 300 MG capsule [id number]  Take 1 capsule (300 mg total) by mouth 3 (three) times daily for 3  days, THEN 2 capsules (600 mg total) 3 (three) times daily. Mitzi Hansen, MD  Active   glucose blood (CONTOUR NEXT TEST) test strip [id number]  Please use to check your sugar 3 times a day. Welford Roche, MD  Active   hydrOXYzine (ATARAX/VISTARIL) 25 MG tablet [id number] Yes Take 1 tablet (25 mg total) by mouth 3 (three) times daily. Salley Slaughter, NP Taking Active   ibuprofen (ADVIL) 800 MG tablet [id number]  Take 1 tablet (800 mg total) by mouth every 8 (eight) hours as needed. [name], MD  Active   insulin aspart (NOVOLOG FLEXPEN) 100 UNIT/ML FlexPen [id number]  Inject 10 Units into the skin 3 (three) times daily with meals. INJECT 10 UNITS INTO THE SKIN 3 TIMES DAILY WITH MEALS. [name], MD  Active   Insulin Glargine (LANTUS SOLOSTAR) 100 UNIT/ML Solostar Pen [id number]  Inject 35 Units into the skin daily at 10 pm. Welford Roche, MD  Active   Insulin Pen Needle (UNIFINE PENTIPS) 31G X 5 MM MISC [id number]  Inject 1 Units into the skin 3 (three) times daily. Use to inject insulin 4 times daily. IM PROGRAM Christian, [name], MD  Active   lamoTRIgine (LAMICTAL) 25 MG tablet [id number]  Take 1 tablet (25 mg total) by mouth daily. Salley Slaughter, NP  Active   Lancets MISC [id number]  1 Units by Does not apply route 3 (three) times daily. Welford Roche, MD  Active   meloxicam (MOBIC) 15 MG tablet [id number]  Take 1 tablet (15 mg total) by mouth daily.  Patient not taking: Reported on 10/11/2020   [name], DPM  Active   Multiple Vitamins-Minerals (MULTIVITAMIN WITH MINERALS) tablet [id number]  Take 1 tablet by mouth daily. [provider]  Active   nystatin (NYSTATIN) powder [id number]  Apply topically 4 (four) times daily.  Patient not taking: Reported on 10/11/2020   Welford Roche, MD  Active   sertraline (ZOLOFT) 50 MG tablet [id number] Yes Take 1 tablet (50 mg total) by mouth daily. [name], MD Taking Active    sitaGLIPtin-metformin [facility]) 50-1000 MG tablet [id number]  TAKE 1 TABLET BY MOUTH 2 TIMES DAILY WITH A MEAL. Welford Roche, MD  Active   traZODone (DESYREL) 50 MG tablet [id number] Yes Take 1 tablet (50 mg total) by mouth at bedtime as needed for sleep. Salley Slaughter, NP Taking Active           Fall/Depression Screening:  Fall Risk  08/08/2020 08/03/2020 07/30/2020  Falls in the past year? 1 1 0  Comment - Patient stated she fell about 2 months ago. -  Number falls in past yr: 0 - -  Comment - - -  Injury with Fall? 1 - -  Risk for fall due to : - - Impaired balance/gait;Impaired mobility  Follow up - - Falls evaluation completed   [facility] 2/9 Scores 10/25/2020 10/04/2020 08/08/2020 07/30/2020 10/10/2019 08/04/2019 04/14/2019  PHQ - 2 Score _0 PHQ- 9 Score _1 Exception Documentation -  Medical reason - - - - -    Assessment:  Goals Addressed              This Visit's Progress   .  "I need resources to help pay for my diabetes medications." (pt-stated)   Not on track     [place] (see longitudinal plan of care for additional care plan information)  Current Barriers:  . Community resources access barrier: Patient Lacks knowledge of community resource: to assist with medication obtaining medication such as the [employer]. . Medication procurement . Financial constraints related to unemployment and on disability and having no insurance. Patient stated she is not eligible for Medicaid due to disability payments being $24 over the limit for Medicaid.   Clinical Social Work Clinical Goal(s):  Marland Kitchen Over the next 7-14 days, patient will work with BSW to address concerns related to resources needed  Interventions: . Inter-disciplinary care team collaboration (see longitudinal plan of care) . Patient interviewed and appropriate assessments performed . Referred patient to community resources care guide  team for assistance with resources to assist with patient getting her diabetes medications. . BSW will contact patient to discuss needs and available resources. BSW provided patient with the Medication Assistance Program resource (671) [phone], to assist with getting her medications at no cost. . BSW contacted Dr. Gilberto Better to ask for a new prescription for Sertraline since patient has been without medication for 2 days. Dr. Truman Hayward will send prescription to [place] once system comes up. Marland Kitchen Update 09/06/2020: LCSW provided information for patient to access Affordable Care Act at www.healthcare.gov as a possible resource for insurance. Marland Kitchen Update 10/25/2020: Patient states she still has difficulty paying for her diabetes medication. She questions if she would be able to get her medications at [facility], which is where she receives her mental health medications. LCSW will collaborate with pharmacist regarding medication procurement.  Patient Self Care Activities:  . Patient will work with the Managed Medicaid team. . Licensed Clinical Social Worker will refer to BSW to assist with resources.  Please see past updates related to this goal by clicking on the "Past Updates" button in the selected goal     .  COMPLETED: "I want to start outpatient therapy again." (pt-stated)   On track     [place] (see longitudinal plan of care for additional care plan information)  Current Barriers:  . Patient needs assistance with appointment scheduling and follow up with [facility] agency[facility]. . Mental Health Concerns - Patient struggles with depression but hasn't received therapy since June due to changes within the agency where she was receiving services at that time.  . Patient stated that today (08/16/2020), she has taken the last pill for one of her depression medications.  Marland Kitchen Update 09/06/2020: Patient states she has a therapy appointment  on 09/06/2020 @ 3:00pm.  Clinical Social Work Clinical Goal(s):  Marland Kitchen Over the next 7-14 days, patient will work with LCSW to address needs related to managing stress.  Patient stated she continues to work with her mother, who is experiencing decline. She stated a lot of things make her angry because she wants her mother to receive the best care as possible. She states she can't have time to herself because she is always caring for her mother. She states she has 5 siblings (a brother and a sister passed away). She states 2 brothers live locally but  they don't help out as much as she does. . Over the next 14-30 days, patient will work with BSW to schedule appointment at [facility]). Patient states BSW called her while she was taking care of things for her mother, who receives in-home hospice care. However, she stated she was supposed to return her call but she doesn't have the number. She has not been scheduled for outpatient therapy at this time. Marland Kitchen Update 09/06/2020: Patient scheduled for therapy at 09/06/2020 @ 3:00pm.   . Update 10/25/2020: Patient has received her assessment and initial psychiatric medication management. She is scheduled to begin weekly therapy after the first of the year (due to the holiday season). She was referred to [facility] by the psychiatrist for assistance with medication procurement.  Interventions: . Inter-disciplinary care team collaboration (see longitudinal plan of care) . Patient interviewed and appropriate assessments performed . Referred patient to BSW for assistance with scheduling appointment for therapy and medication management.  Nash Dimmer with BSW re: scheduling appointment at [facility] - [phone]. BSW scheduled patient an appointment at [facility] for 09/06/20 at 3:00PM.     Patient Self Care Activities:  . Patient will continue to take medications as prescribed until she can be seen at [place] will work with patient  and provider to assist with scheduling therapy and medication management.  Marland Kitchen LCSW will follow-up in 14-30 days.  Please see past updates related to this goal by clicking on the "Past Updates" button in the selected goal        Plan: The Managed [facility] Care Team will follow up with patient in 14 days.  Follow-up:  Patient agrees to Care Plan and Follow-up.   [name], BSW, MSW, LCSW Social Work Case Freight forwarder - [place]  Direct [place]: [phone]

## 2020-10-25 NOTE — Patient Instructions (Signed)
Visit Information Ms. Holloman, it was a pleasure speaking with you today. Please remember that the Pharmacist of the Managed [facility] Care Team will contact you to discuss your prescriptions.   Ms. Madia was given information about Medicaid Managed Care team care coordination services and verbally consented to engagement with the [facility] Managed Care team.   Goals Addressed              This Visit's Progress     "I need resources to help pay for my diabetes medications." (pt-stated)   Not on track     [place] (see longitudinal plan of care for additional care plan information)  Current Barriers:   Community resources access barrier: Patient Lacks knowledge of community resource: to assist with medication obtaining medication such as the [place].  Medication procurement  Financial constraints related to unemployment and on disability and having no insurance. Patient stated she is not eligible for Medicaid due to disability payments being $24 over the limit for Medicaid.   Clinical Social Work Clinical Goal(s):   Over the next 7-14 days, patient will work with BSW to address concerns related to resources needed  Interventions:  Inter-disciplinary care team collaboration (see longitudinal plan of care)  Patient interviewed and appropriate assessments performed  Referred patient to community resources care guide team for assistance with resources to assist with patient getting her diabetes medications.  BSW will contact patient to discuss needs and available resources. BSW provided patient with the Medication Assistance Program resource [phone], to assist with getting her medications at no cost.  BSW contacted Dr. Gilberto Better to ask for a new prescription for Sertraline since patient has been without medication for 2 days. Dr. Truman Hayward will send prescription to [place] once system comes up.  Update  09/06/2020: LCSW provided information for patient to access Affordable Care Act at www.healthcare.gov as a possible resource for insurance.  Update 10/25/2020: Patient states she still has difficulty paying for her diabetes medication. She questions if she would be able to get her medications at [facility], which is where she receives her mental health medications. LCSW will collaborate with pharmacist regarding medication procurement.  Patient Self Care Activities:   Patient will work with the Managed Medicaid team.  Licensed Clinical Social Worker will refer to [place] to assist with resources.  Please see past updates related to this goal by clicking on the "Past Updates" button in the selected goal       COMPLETED: "I want to start outpatient therapy again." (pt-stated)   On track     [place] (see longitudinal plan of care for additional care plan information)  Current Barriers:   Patient needs assistance with appointment scheduling and follow up with community agency[facility].  Mental Health Concerns - Patient struggles with depression but hasn't received therapy since June due to changes within the agency where she was receiving services at that time.   Patient stated that today (08/16/2020), she has taken the last pill for one of her depression medications.   Update 09/06/2020: Patient states she has a therapy appointment on 09/06/2020 @ 3:00pm.  Clinical Social Work Clinical Goal(s):   Over the next 7-14 days, patient will work with LCSW to address needs related to managing stress.  Patient stated she continues to work with her mother, who is experiencing decline. She stated a lot of things make her angry because she wants  her mother to receive the best care as possible. She states she can't have time to herself because she is always caring for her mother. She states she has 5 siblings (a brother and a sister passed away).  She states 2 brothers live locally but they don't help out as much as she does.  Over the next 14-30 days, patient will work with BSW to schedule appointment at [facility]). Patient states BSW called her while she was taking care of things for her mother, who receives in-home hospice care. However, she stated she was supposed to return her call but she doesn't have the number. She has not been scheduled for outpatient therapy at this time.  Update 09/06/2020: Patient scheduled for therapy at 09/06/2020 @ 3:00pm.    Update 10/25/2020: Patient has received her assessment and initial psychiatric medication management. She is scheduled to begin weekly therapy after the first of the year (due to the holiday season). She was referred to [facility] by the psychiatrist for assistance with medication procurement.  Interventions:  Inter-disciplinary care team collaboration (see longitudinal plan of care)  Patient interviewed and appropriate assessments performed  Referred patient to BSW for assistance with scheduling appointment for therapy and medication management.   Collaborated with BSW re: scheduling appointment at [facility] - [phone]. BSW scheduled patient an appointment at [facility] for 09/06/20 at 3:00PM.     Patient Self Care Activities:   Patient will continue to take medications as prescribed until she can be seen at [facility] will work with patient and provider to assist with scheduling therapy and medication management.   LCSW will follow-up in 14-30 days.  Please see past updates related to this goal by clicking on the "Past Updates" button in the selected goal        Patient verbalizes understanding of instructions provided today.   The Managed Medicaid care management team will reach out to the patient again over the next 14 days.  The patient has been provided with contact information for the Managed Medicaid care management team and has  been advised to call with any health related questions or concerns.   [name], LCSW [name], BSW, MSW, LCSW Social Work Case Freight forwarder - [place]  Direct [place]: [phone]

## 2020-10-30 ENCOUNTER — Other Ambulatory Visit: Payer: Self-pay | Admitting: Internal Medicine

## 2020-10-30 DIAGNOSIS — E1165 Type 2 diabetes mellitus with hyperglycemia: Secondary | ICD-10-CM

## 2020-10-30 MED ORDER — ATORVASTATIN CALCIUM 40 MG PO TABS: 40.0000 mg | ORAL_TABLET | Freq: Every day | ORAL | 11 refills | Status: DC

## 2020-10-30 MED FILL — ATORVASTATIN 40 MG TABLET: 40 | 30 days supply | Qty: 30 | Fill #0

## 2020-10-31 ENCOUNTER — Ambulatory Visit (INDEPENDENT_AMBULATORY_CARE_PROVIDER_SITE_OTHER): Payer: Medicaid Other | Admitting: Behavioral Health

## 2020-10-31 ENCOUNTER — Other Ambulatory Visit: Payer: Self-pay

## 2020-10-31 DIAGNOSIS — F333 Major depressive disorder, recurrent, severe with psychotic symptoms: Secondary | ICD-10-CM | POA: Diagnosis not present

## 2020-10-31 NOTE — Progress Notes (Signed)
   THERAPIST PROGRESS NOTE  Session Time: 1:00PM  Participation Level: Active  Behavioral Response: Neat and Well GroomedAlertApproriate  Type of Therapy: Individual Therapy  Treatment Goals addressed: Diagnosis: MDD  Interventions: CBT  Summary: Tina [name] is a 49 y.o. female who presents to [facility] for a scheduled individual therapy session. 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 Client reports she is currently trying to obtain clarity regarding her medicaid benefits. She is not managing her stressors well and would benefit from using her coping skills. She shared that she often "goes for a drive" when she is experiencing increased stressors. She further reports her sleep patterns have decreased and she is "only getting 1 hr of sleep" and her appetite has decreased. When asked to rate her depression, clt rated it an "11". Clt denied SI/HI/AVH.  Suicidal/Homicidal: No  Therapist Response: Therapist offered encouragement and support. Therapist encouraged clt to identify things that are within her control and things that are outside of her control. Clt would benefit from utilizing self-care techniques.  Plan: Return again in 3 weeks.  Diagnosis: Axis I: Major Depression, Recurrent severe    Axis II: No diagnosis    Mamie Nick, Counselor 10/31/2020

## 2020-11-02 MED FILL — NOVOLOG FLEXPEN SYRINGE: 100 | 30 days supply | Qty: 9 | Fill #1

## 2020-11-07 ENCOUNTER — Other Ambulatory Visit: Payer: Self-pay | Admitting: Obstetrics and Gynecology

## 2020-11-07 ENCOUNTER — Telehealth: Payer: Self-pay

## 2020-11-07 DIAGNOSIS — E1165 Type 2 diabetes mellitus with hyperglycemia: Secondary | ICD-10-CM

## 2020-11-07 MED ORDER — LANTUS SOLOSTAR 100 UNIT/ML ~~LOC~~ SOPN: 35.0000 [IU] | PEN_INJECTOR | Freq: Every day | SUBCUTANEOUS | 2 refills | Status: DC

## 2020-11-07 MED FILL — LANTUS SOLOSTAR 100 UNITS/M: 100 | 25 days supply | Qty: 9 | Fill #0

## 2020-11-07 NOTE — Patient Outreach (Signed)
Care Coordination  11/07/2020  Letecia Arps Sutley [birth year] [id number]    Medicaid Managed Care   Unsuccessful Outreach Note  11/07/2020 Name: Adeola Dennen MRN: [id number] DOB: 1971/03/03  Referred by: Albertha Ghee, MD Reason for referral : High Risk Managed Medicaid (Unsuccessful telephone outreach)   An unsuccessful telephone outreach was attempted today. The patient was referred to the case management team for assistance with care management and care coordination.   Follow Up Plan: A member of the Managed Medicaid team will follow up with patient within 7 days.  Kathi Der RN, BSN [place]  [employer], production - Managed Medicaid High Risk [phone].

## 2020-11-07 NOTE — Patient Instructions (Signed)
Hi Ms. Zaucha, sorry we missed you today - as a part of your Medicaid benefit, you are eligible for care management and care coordination services at no cost or copay. I was unable to reach you by phone today but would be happy to help you with your health related needs. Please feel free to call me at [phone].  A member of the Managed Medicaid care management team will reach out to you again over the next 7 days.   Kathi Der RN, BSN [place]  [employer], production - Managed Medicaid High Risk [phone].

## 2020-11-07 NOTE — Telephone Encounter (Signed)
Having trouble getting medicine [phone]

## 2020-11-07 NOTE — Telephone Encounter (Signed)
Spoke with the patient.  Appt has been sch for 12/05/2020 @ 9:15 am with Dr. Barbaraann Faster.

## 2020-11-07 NOTE — Telephone Encounter (Signed)
Pt upset-states she contacted pharmacy last week for this refill and now she is completely out.  Needs rx sent in asap (does not need pen needles at this time).  CMA apologized for the delay and unable to locate previous request.  Also informed pt that she is due for routine follow up visit and will send appt request to front desk.

## 2020-11-07 NOTE — Telephone Encounter (Signed)
Refill for insulin sent

## 2020-11-08 ENCOUNTER — Other Ambulatory Visit: Payer: Self-pay | Admitting: Student

## 2020-11-08 DIAGNOSIS — M79672 Pain in left foot: Secondary | ICD-10-CM

## 2020-11-08 DIAGNOSIS — M79671 Pain in right foot: Secondary | ICD-10-CM

## 2020-11-08 MED FILL — GABAPENTIN 300 MG CAPSULE: 300 | 30 days supply | Qty: 180 | Fill #1

## 2020-11-08 MED FILL — INVOKANA 100 MG TABLET: 100 | 30 days supply | Qty: 30 | Fill #2

## 2020-11-09 MED FILL — ENALAPRIL MALEATE 20 MG TAB: 20 | 30 days supply | Qty: 30 | Fill #3

## 2020-11-12 ENCOUNTER — Encounter (HOSPITAL_COMMUNITY): Payer: Self-pay | Admitting: Psychiatry

## 2020-11-12 ENCOUNTER — Other Ambulatory Visit: Payer: Self-pay | Admitting: Psychiatry

## 2020-11-12 ENCOUNTER — Other Ambulatory Visit: Payer: Self-pay

## 2020-11-12 ENCOUNTER — Telehealth (INDEPENDENT_AMBULATORY_CARE_PROVIDER_SITE_OTHER): Payer: Medicaid Other | Admitting: Psychiatry

## 2020-11-12 DIAGNOSIS — F3181 Bipolar II disorder: Secondary | ICD-10-CM

## 2020-11-12 DIAGNOSIS — F411 Generalized anxiety disorder: Secondary | ICD-10-CM | POA: Diagnosis not present

## 2020-11-12 DIAGNOSIS — F333 Major depressive disorder, recurrent, severe with psychotic symptoms: Secondary | ICD-10-CM | POA: Diagnosis not present

## 2020-11-12 MED ORDER — HYDROXYZINE HCL 25 MG PO TABS: 25.0000 mg | ORAL_TABLET | Freq: Three times a day (TID) | ORAL | 3 refills | Status: DC

## 2020-11-12 MED ORDER — TRAZODONE HCL 150 MG PO TABS: 150.0000 mg | ORAL_TABLET | Freq: Every evening | ORAL | 2 refills | Status: DC | PRN

## 2020-11-12 MED ORDER — BUSPIRONE HCL 10 MG PO TABS: 10.0000 mg | ORAL_TABLET | Freq: Three times a day (TID) | ORAL | 2 refills | Status: DC

## 2020-11-12 MED ORDER — ARIPIPRAZOLE 5 MG PO TABS: 5.0000 mg | ORAL_TABLET | Freq: Every day | ORAL | 2 refills | Status: DC

## 2020-11-12 MED ORDER — LAMOTRIGINE 25 MG PO TABS: 50.0000 mg | ORAL_TABLET | Freq: Every day | ORAL | 1 refills | Status: DC

## 2020-11-12 MED ORDER — SERTRALINE HCL 50 MG PO TABS: 50.0000 mg | ORAL_TABLET | Freq: Every day | ORAL | 2 refills | Status: DC

## 2020-11-12 NOTE — Addendum Note (Signed)
Addended by: Salley Slaughter on: 11/12/2020 04:47 PM   Modules accepted: Level of Service
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
## 2020-11-13 ENCOUNTER — Other Ambulatory Visit: Payer: Self-pay

## 2020-11-13 ENCOUNTER — Other Ambulatory Visit: Payer: Self-pay | Admitting: *Deleted

## 2020-11-13 ENCOUNTER — Ambulatory Visit: Payer: Medicaid Other

## 2020-11-13 DIAGNOSIS — E1165 Type 2 diabetes mellitus with hyperglycemia: Secondary | ICD-10-CM

## 2020-11-13 MED ORDER — JANUMET 50-1000 MG PO TABS: ORAL_TABLET | ORAL | 12 refills | Status: DC

## 2020-11-13 MED FILL — JANUMET 50-1,000 MG TABLET: 50-1000 | 30 days supply | Qty: 60 | Fill #0

## 2020-11-13 MED FILL — ?ARIPRAZOLE 5 MG TABS: 5 | 30 days supply | Qty: 30 | Fill #0

## 2020-11-13 NOTE — Patient Instructions (Signed)
Visit Information  Ms. Scheuring was given information about Medicaid Managed Care team care coordination services and verbally consented to engagement with the [facility] Managed Care team.    Patient verbalizes understanding of instructions provided today.   Pharmacist will contact patient as soon as possible to discuss problems with her medications. The patient has been provided with contact information for the Managed Medicaid care management team and has been advised to call with any health related questions or concerns.   [name], LCSW [name], BSW, MSW, LCSW Social Work Case Freight forwarder - [place]  Direct [place]: [phone]

## 2020-11-13 NOTE — Patient Outreach (Signed)
Care Coordination  11/13/2020  Tina [name] 08-Feb-1971 [id number]  LCSW received an urgent call from patient. She stated she was surprised when she went to pick up her medications today at [facility] and the cost had gone up. She stated that it was her understanding that each of her medications will cost $10 each instead of the previous $4 each. Patient further stated that she was informed that her medications are on hold due to this price increase, and she was unable to get any of her meds today. LCSW assured patient that a message will be forwarded to the MM team Pharmacist. Patient verbalized understanding and was agreeable.  Patient stated she was informed by her therapist that she now has Family Planning Medicaid and she doesn't understand the reason. LCSW discussed the spend-down amount she mentioned she had to fulfil prior to Medicaid paying, and suggested that perhaps the spend-down is the reason for the type of Medicaid she was informed she now has. LCSW encouraged patient to contact the DSS Supervisor she had been working with regarding the spend-down. Patient verbalized understanding and was agreeable.   Tina [name], BSW, MSW, LCSW Social Work Case Freight forwarder - [place]  Direct [place]: [phone]

## 2020-11-14 ENCOUNTER — Other Ambulatory Visit: Payer: Self-pay

## 2020-11-14 ENCOUNTER — Telehealth: Payer: Self-pay

## 2020-11-14 MED FILL — ?BUSPIRONE HCL 10 MG TABLET: 10 | 30 days supply | Qty: 90 | Fill #0

## 2020-11-14 MED FILL — ?SERTRALINE HCL 50 MG TABS: 50 | 30 days supply | Qty: 30 | Fill #0

## 2020-11-14 MED FILL — ?TRAZODONE HCL 150 MG TABL: 150 | 30 days supply | Qty: 30 | Fill #0

## 2020-11-14 NOTE — Patient Outreach (Signed)
Patient called me saying her medicines are $10 when they're normally $4. I called the Pharmacy who said, "We don't have any insurance on file for her." Called the patient who said she didn't have any insurance. I co-signed our Education officer, museum and gave [place] the # to [employer] to have her call them STAT to get some sort of insurance. Will F/U as scheduled

## 2020-11-14 NOTE — Telephone Encounter (Signed)
LEFT A VOICEMAIL FOR PATIENT AT 11:15AM TODAY.  PATIENT IS HAVING TROUBLE WITH [facility] COPAY FOR CASH PATIENTS OF $10 FOR HER ABILIFY.  AS LONG AS PATIENT DOES NOT HAVE INSURANCE COVERAGE SHE MAY QUALIFY FOR DOH STOCK WHICH IS $0 TO PATIENT.  PATIENT WOULD HAVE TO ANSWER/FILL OUT DOH FORM TO DETERMINE ELIGIBILITY.  IF ELIGIBLE, AS LONG AS STOCK IS AVAILABLE THROUGH DO[facility]) COPAY WOULD BE $0 FOR 1 YEAR.  IF NOT AVAILABLE THROUGH DOH, COPAY IS $10.  AS OF 11/14/20, WE DO HAVE DOH STOCK OF ARIPIPRAZOLE 5MG .

## 2020-11-19 ENCOUNTER — Other Ambulatory Visit: Payer: Self-pay

## 2020-11-19 ENCOUNTER — Encounter (HOSPITAL_COMMUNITY): Payer: Self-pay

## 2020-11-19 NOTE — Patient Instructions (Signed)
Thank you for speaking with me today regarding care management and care coordination needs. I will follow up with you in 2 weeks on if you were eligible for the program.   Mickel Fuchs, BSW, [place] Medicaid Team

## 2020-11-19 NOTE — Patient Outreach (Signed)
Medicaid Managed Care Social Work Note  11/19/2020 Name:  Tina [name] MRN:  [id number] DOB:  09/19/1971  Tina [name] is an 50 y.o. year old female who is a primary patient of [name], MD.  The Medicaid Managed Care Coordination team was consulted for assistance with:  [place] and Resources  Tina [name] was given information about Medicaid Managed CareCoordination services today. Tina [name] agreed to services and verbal consent obtained.  Engaged with patient  for by telephone forfollow up visit in response to referral for case management and/or care coordination services.   Assessments/Interventions:  Review of past medical history, allergies, medications, health status, including review of consultants reports, laboratory and other test data, was performed as part of comprehensive evaluation and provision of chronic care management services.  SDOH: (Social Determinant of Health) assessments and interventions performed:  BSW spoke with patient and she is doing ok. She continues to have a lot of stress with her mother and other personal things. Patient stated she was able to get most of her medications for free at the [facility], she only has to pay $14.00 per month. She is waiting for a decision on if she qualifies for a program for her to continue to get her medications for free.      Advanced Directives Status:  Not addressed in this encounter.  Care Plan                 Allergies  Allergen Reactions  . Codeine Other (See Comments)    hallucinations    Medications Reviewed Today    Reviewed by Salley Slaughter, NP (Nurse Practitioner) on 11/12/20 at 1454  Med List Status: <None>  Medication Order Taking? Sig Documenting Provider Last Dose Status Informant  albuterol (PROVENTIL HFA;VENTOLIN HFA) 108 (90 Base) MCG/ACT inhaler [id number] No Inhale 1-2 puffs into the lungs every 6 (six) hours as  needed for wheezing or shortness of breath.  Patient not taking: Reported on 10/11/2020   Amaryllis Dyke, PA-C Not Taking Active   ARIPiprazole (ABILIFY) 5 MG tablet [id number] Yes Take 1 tablet (5 mg total) by mouth daily. Salley Slaughter, NP  Active   atorvastatin (LIPITOR) 40 MG tablet [id number]  Take 1 tablet (40 mg total) by mouth daily. Sid Falcon, MD  Active   Blood Glucose Monitoring Suppl (CONTOUR NEXT ONE) KIT [id number] No 1 each by Does not apply route See admin instructions. USE TO CHECK BLOOD GLUCOSE ONCE DAILY. IM PROGRAM. [name], MD Taking Active   busPIRone (BUSPAR) 10 MG tablet [id number]  Take 1 tablet (10 mg total) by mouth 3 (three) times daily. Salley Slaughter, NP  Active   canagliflozin [facility]) 100 MG TABS tablet [id number] No Take 1 tablet (100 mg total) by mouth daily before breakfast. [name], MD Taking Active   cyclobenzaprine (FEXMID) 7.5 MG tablet [id number] No Take 1 tablet (7.5 mg total) by mouth 3 (three) times daily as needed for muscle spasms.  Patient not taking: Reported on 10/11/2020   Marcelyn Bruins, MD Not Taking Active   enalapril (VASOTEC) 20 MG tablet [id number] No TAKE 1 TABLET (20 MG TOTAL) BY MOUTH DAILY. Andrew Au, MD Taking Active   gabapentin (NEURONTIN) 300 MG capsule [id number] No Take 1 capsule (300 mg total) by mouth 3 (three) times daily for 3 days, THEN 2 capsules (600 mg total) 3 (three) times daily. Mitzi Hansen, MD Taking Expired 10/31/20 2359  glucose blood (CONTOUR NEXT TEST) test strip [id number] No Please use to check your sugar 3 times a day. Welford Roche, MD Taking Active   hydrOXYzine (ATARAX/VISTARIL) 25 MG tablet [id number]  Take 1 tablet (25 mg total) by mouth 3 (three) times daily. Salley Slaughter, NP  Active   ibuprofen (ADVIL) 800 MG tablet [id number] No Take 1 tablet (800 mg total) by mouth every 8 (eight) hours as needed. [name], MD Taking Active   insulin aspart  (NOVOLOG FLEXPEN) 100 UNIT/ML FlexPen [id number]  Inject 10 Units into the skin 3 (three) times daily with meals. INJECT 10 UNITS INTO THE SKIN 3 TIMES DAILY WITH MEALS. [name], MD  Active   insulin glargine (LANTUS SOLOSTAR) 100 UNIT/ML Solostar Pen [id number]  Inject 35 Units into the skin daily at 10 pm. Modena Nunnery D, DO  Active   Insulin Pen Needle (UNIFINE PENTIPS) 31G X 5 MM MISC [id number] No Inject 1 Units into the skin 3 (three) times daily. Use to inject insulin 4 times daily. IM PROGRAM Christian, [name], MD Taking Active   lamoTRIgine (LAMICTAL) 25 MG tablet [id number]  Take 2 tablets (50 mg total) by mouth daily. Salley Slaughter, NP  Active   Lancets MISC [id number] No 1 Units by Does not apply route 3 (three) times daily. Welford Roche, MD Taking Active   meloxicam (MOBIC) 15 MG tablet [id number] No Take 1 tablet (15 mg total) by mouth daily.  Patient not taking: Reported on 10/11/2020   [name], DPM Not Taking Active   Multiple Vitamins-Minerals (MULTIVITAMIN WITH MINERALS) tablet [id number] No Take 1 tablet by mouth daily. [provider] Taking Active   nystatin (NYSTATIN) powder [id number] No Apply topically 4 (four) times daily.  Patient not taking: Reported on 10/11/2020   Welford Roche, MD Not Taking Active   sertraline (ZOLOFT) 50 MG tablet [id number]  Take 1 tablet (50 mg total) by mouth daily. Salley Slaughter, NP  Active   sitaGLIPtin-metformin (JANUMET) 50-1000 MG tablet [id number] No TAKE 1 TABLET BY MOUTH 2 TIMES DAILY WITH A MEAL. Welford Roche, MD Taking Active   traZODone (DESYREL) 150 MG tablet [id number]  Take 1 tablet (150 mg total) by mouth at bedtime as needed for sleep. Salley Slaughter, NP  Active           Patient Active Problem List   Diagnosis Date Noted  . Generalized anxiety disorder 11/12/2020  . Severe recurrent major depression with psychotic features ([place]) 10/04/2020  . Bipolar 2 disorder,  major depressive episode ([place]) 10/04/2020  . Pain in both feet 10/10/2019  . Fibroids 04/14/2019  . Financial difficulties 04/13/2018  . Seasonal allergies 02/06/2017  . Osteoarthritis of right ankle 08/29/2016  . Tobacco abuse 01/21/2016  . Severe obesity (BMI 35.0-39.9) 10/12/2015  . Foot ulcer ([place]) 09/13/2015  . Ovarian cyst 12/24/2012  . Right knee pain 12/09/2012  . Hypertension 07/20/2012  . Healthcare maintenance 06/27/2011  . Uncontrolled type 2 diabetes mellitus ([place]) 05/30/2010  . Dyslipidemia 05/30/2010  . Depression 05/30/2010  . 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    Conditions to be addressed/monitored per PCP order:  None  There are no care plans that you recently modified to display for this patient.   Follow up:  Patient agrees to Care Plan and Follow-up.  Plan: The Managed Medicaid care management team will reach out to the patient again over the next 14 days.  Date/time of next scheduled Social Work care management/care coordination  outreach:  12/07/20 12:45pm  Mickel Fuchs, BSW, [place]  High Risk Managed Medicaid Team

## 2020-11-20 ENCOUNTER — Other Ambulatory Visit: Payer: Self-pay

## 2020-11-20 ENCOUNTER — Ambulatory Visit (HOSPITAL_COMMUNITY): Payer: Medicaid Other | Admitting: Behavioral Health

## 2020-11-27 ENCOUNTER — Ambulatory Visit (INDEPENDENT_AMBULATORY_CARE_PROVIDER_SITE_OTHER): Payer: Medicaid Other | Admitting: Podiatry

## 2020-11-27 ENCOUNTER — Other Ambulatory Visit: Payer: Self-pay | Admitting: Podiatry

## 2020-11-27 ENCOUNTER — Other Ambulatory Visit: Payer: Self-pay

## 2020-11-27 DIAGNOSIS — D492 Neoplasm of unspecified behavior of bone, soft tissue, and skin: Secondary | ICD-10-CM

## 2020-11-27 DIAGNOSIS — M2041 Other hammer toe(s) (acquired), right foot: Secondary | ICD-10-CM | POA: Diagnosis not present

## 2020-11-27 DIAGNOSIS — M21619 Bunion of unspecified foot: Secondary | ICD-10-CM

## 2020-11-27 DIAGNOSIS — E1149 Type 2 diabetes mellitus with other diabetic neurological complication: Secondary | ICD-10-CM | POA: Diagnosis not present

## 2020-11-27 DIAGNOSIS — M2042 Other hammer toe(s) (acquired), left foot: Secondary | ICD-10-CM | POA: Diagnosis not present

## 2020-11-27 DIAGNOSIS — G629 Polyneuropathy, unspecified: Secondary | ICD-10-CM

## 2020-11-27 DIAGNOSIS — M19071 Primary osteoarthritis, right ankle and foot: Secondary | ICD-10-CM

## 2020-11-27 MED ORDER — IBUPROFEN 800 MG PO TABS: 800.0000 mg | ORAL_TABLET | Freq: Three times a day (TID) | ORAL | 0 refills | Status: DC | PRN

## 2020-11-27 MED FILL — !LANTUS SOLOSTAR 100UNITS/M: 100 | 25 days supply | Qty: 9 | Fill #0

## 2020-11-27 MED FILL — ?ATORVASTATIN 20 MG TABLET: 20 | 30 days supply | Qty: 60 | Fill #0

## 2020-11-27 MED FILL — IBUPROFEN 800 MG TABLET: 800 | 10 days supply | Qty: 30 | Fill #0

## 2020-11-27 MED FILL — !NOVOLOG FLEXPEN SYRINGE 1: 100/ML | 30 days supply | Qty: 9 | Fill #0

## 2020-11-28 NOTE — Progress Notes (Signed)
Subjective: 50 year old female presents the office today for evaluation of chronic right ankle pain as well as her painful calluses to both feet.  She has no new concerns today.  She is on gabapentin.  She is not had her A1c rechecked and she apparently goes next week.  Discussed chronic pain to both of her feet as well as neuropathy. No acute changes.   Objective: AAO x3, NAD DP/PT pulses palpable bilaterally, CRT less than 3 seconds Hyperkeratotic tissue present left medial second toe and first interspace right foot.  No ulceration identified.  There is no erythema.  Chronic edema present although mild to the ankle joint.  Mild diffuse tenderness of the anterior ankle joint line.  No other areas of discomfort.  MMT 5/5.  Flatfoot is present bilaterally.  Significant bunion is present. No pain with calf compression, swelling, warmth, erythema.    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 -Debrided hyperkeratotic lesions x2 without any complications or bleeding. -Also discussed in the future surgical correction for bunion and hammertoes but she does have her A1c under better control. -Continue [place] brace/ankle brace for no.  Offered steroid injection but she declined. Ibuprofen 800mg  TID prn prescribed.  -Continue current medications including gabapentin and ibuprofen.  Return in about 4 weeks , or as needed  [name] DPM

## 2020-11-29 ENCOUNTER — Telehealth: Payer: Self-pay

## 2020-11-29 MED FILL — INVOKANA 100 MG TABLET: 100 | 30 days supply | Qty: 30 | Fill #0

## 2020-11-29 NOTE — Telephone Encounter (Signed)
Lantus Solostar approved until 11/25/21. SANOFI PAP [place] approved until 11/22/21.  Novo Nordisk PAP [phone]  Invokana approved until 11/27/21. JJPAF [phone] BIN: [id number] ID: [id number] GRP: [id number]

## 2020-12-03 ENCOUNTER — Other Ambulatory Visit: Payer: Self-pay

## 2020-12-03 ENCOUNTER — Telehealth (HOSPITAL_COMMUNITY): Payer: Medicaid Other | Admitting: Behavioral Health

## 2020-12-04 MED FILL — !JANUMET 50-1000MG TABLET: 50-1000 | 30 days supply | Qty: 60 | Fill #0

## 2020-12-05 ENCOUNTER — Ambulatory Visit (INDEPENDENT_AMBULATORY_CARE_PROVIDER_SITE_OTHER): Payer: Medicaid Other | Admitting: Internal Medicine

## 2020-12-05 ENCOUNTER — Encounter: Payer: Self-pay | Admitting: Internal Medicine

## 2020-12-05 ENCOUNTER — Other Ambulatory Visit: Payer: Self-pay

## 2020-12-05 VITALS — BP 150/75 | HR 87 | Temp 98.6°F | Ht 76.0 in | Wt 267.8 lb

## 2020-12-05 DIAGNOSIS — E1165 Type 2 diabetes mellitus with hyperglycemia: Secondary | ICD-10-CM

## 2020-12-05 DIAGNOSIS — I10 Essential (primary) hypertension: Secondary | ICD-10-CM

## 2020-12-05 LAB — POCT GLYCOSYLATED HEMOGLOBIN (HGB A1C): Hemoglobin A1C: 7.1 % — AB (ref 4.0–5.6)

## 2020-12-05 LAB — GLUCOSE, CAPILLARY: Glucose-Capillary: 153 mg/dL — ABNORMAL HIGH (ref 70–99)

## 2020-12-05 NOTE — Patient Instructions (Signed)
Thank you, Ms.Contina [place]tale [place] for allowing [place] to provide your care today. Today we discussed high blood pressure and type 2 diabetes mellitus .    I have ordered the following labs for you:   Lab Orders     BMP8+Anion Gap     POC Hbg A1C   Tests ordered today:    Referrals ordered today:    Referral Orders     Referral to Nutrition and Diabetes Services   I have ordered the following medication/changed the following medications:   Stop the following medications: Medications Discontinued During This Encounter  Medication Reason  . albuterol (PROVENTIL HFA;VENTOLIN HFA) 108 (90 Base) MCG/ACT inhaler No longer needed (for PRN medications)  . meloxicam (MOBIC) 15 MG tablet No longer needed (for PRN medications)  . nystatin (NYSTATIN) powder No longer needed (for PRN medications)     Start the following medications: No orders of the defined types were placed in this encounter.    Follow up: 3 months or less depending on results of lab work    Remember: I will follow up with the lab results in 1-2 days.  Should you have any questions or concerns please call the internal medicine clinic at [phone].      Tamsen Snider, M.D. [place]

## 2020-12-05 NOTE — Assessment & Plan Note (Signed)
BP Readings from Last 3 Encounters:  12/05/20 (!) 150/75  08/08/20 130/72  07/30/20 (!) 146/64   Patient's blood pressure above goal.  She has been a goal in the past and reports pain in her ankles today.  In addition her mother is on hospice care and she admits she is overwhelmed with everything going on in her life at this time.  Blood pressure goall 130/80.  We will hold off on additional medications today while we are focusing on getting her diabetes under better control.  If patient is elevated at next visit we will make adjustment. I encouraged lifestyle changes as tolerated given limited mobility. We discussed obtaining a home blood pressure meter for ambulatory monitoring.   -Continue with enalapril 20 mg daily -BMP

## 2020-12-05 NOTE — Assessment & Plan Note (Addendum)
DMII: Hgb A1c 14 % at the last visit. Current A1c 7.1%. This is a big improvement and I expect this is from medication changes and compliance since last Hgb A1c. The patient has not been checking her blood glucose due to discomfort. I will refer her to Diabetic Educator for continous glucose monitoring and counseling on diet. Her chronic joint pain would greatly benefit from  weight loss  Her A1c is at goal since recent medication changes.   I would like to possibly make some changes to her regimen to decrease pill burden ( combine canaglifozin and Metformin) and stop Janumet,  but will need to check in to affordability with [facility] ,PharmD. In addition I would like to start GLP-1 , Dulaglutide . Once I am able to get some more blood glucose reading we can hopefully titrate down Insulin.   Plan: Continue Canagliflozin 100 mg Continue Janumet 50 -1000 mg BID Continue Lantus 35 units qhs  Novolog 10 units TID  - Referral to diabetes and nutritional services   Repeat A1c at next visit if after 3 months from this date   Addendum: Dr. Claiborne Billings is obtaining Invokanamet and semaglutide for patient.  When Dr. Claiborne Billings receive these prescriptions patient is aware she will be stopping Janumet.  She will also stop Invokana when starting Invokanamet.

## 2020-12-05 NOTE — Progress Notes (Signed)
   CC: Hypertension and type II diabetes mellitus  HPI:[name] is a 50 y.o. female who presents for evaluation of type 2 diabetes and diabetes mellitus. Please see individual problem based A/P for details.   Past Medical History:  Diagnosis Date  . Diabetes mellitus    type II  . Fibroids   . HTN (hypertension)   . Hyperlipidemia   . Iron deficiency anemia   . Left acetabular fracture ([place])   . Lumbar strain   . Obesity   . Ovarian cyst    Review of Systems:   Review of Systems  Constitutional: Negative for chills and fever.  Musculoskeletal: Positive for falls and joint pain (chronic).  Neurological: Negative for dizziness and headaches.     Physical Exam: Vitals:   12/05/20 0850  BP: (!) 150/75  Pulse: 87  Temp: 98.6 F (37 C)  TempSrc: Oral  SpO2: 97%  Weight: 267 lb 12.8 oz (121.5 kg)  Height: 6\' 4"  (1.93 m)   Physical Exam Constitutional:      General: She is not in acute distress.    Appearance: Normal appearance.  Cardiovascular:     Rate and Rhythm: Normal rate and regular rhythm.  Pulmonary:     Effort: Pulmonary effort is normal.     Breath sounds: Normal breath sounds. No wheezing or rhonchi.  Abdominal:     General: Bowel sounds are normal.     Tenderness: There is no guarding.     Assessment & Plan:   See Encounters Tab for problem based charting.  Patient discussed with Dr. Angelia Mould

## 2020-12-06 ENCOUNTER — Other Ambulatory Visit: Payer: Self-pay

## 2020-12-06 LAB — BMP8+ANION GAP
Anion Gap: 19 mmol/L — ABNORMAL HIGH (ref 10.0–18.0)
BUN/Creatinine Ratio: 13 (ref 9–23)
BUN: 10 mg/dL (ref 6–24)
CO2: 21 mmol/L (ref 20–29)
Calcium: 9.7 mg/dL (ref 8.7–10.2)
Chloride: 101 mmol/L (ref 96–106)
Creatinine, Ser: 0.79 mg/dL (ref 0.57–1.00)
GFR calc Af Amer: 100 mL/min/{1.73_m2} (ref >59)
GFR calc non Af Amer: 87 mL/min/{1.73_m2} (ref >59)
Glucose: 140 mg/dL — ABNORMAL HIGH (ref 65–99)
Potassium: 4.5 mmol/L (ref 3.5–5.2)
Sodium: 141 mmol/L (ref 134–144)

## 2020-12-06 NOTE — Patient Instructions (Signed)
Visit Information  Ms. Locey was given information about Medicaid Managed Care team care coordination services and verbally consented to engagement with the [facility] Managed Care team.    Ms. Ashworth - following are the goals we discussed in your visit today:  @GOALSADDRESSED   Please see education materials related to DM provided as print materials.   Patient verbalizes understanding of instructions provided today.   The Managed Medicaid care management team will reach out to the patient again over the next 90 days.   Lane Hacker, [facility]  Following is a copy of your plan of care:  Patient Care Plan: Medication Management    Problem Identified: Health Promotion or Disease Self-Management (General Plan of Care)     Goal: Medication Management   Note:   Current Barriers:  . Unable to independently afford treatment regimen . Does not contact provider office for questions/concerns .   Pharmacist Clinical Goal(s):  Marland Kitchen Over the next 90 days, patient will maintain control of DM as evidenced by Current A1c  . contact provider office for questions/concerns as evidenced notation of same in electronic health record through collaboration with PharmD and provider.  .   Interventions: . Inter-disciplinary care team collaboration (see longitudinal plan of care) . Comprehensive medication review performed; medication list updated in electronic medical record  @RXCPDIABETES @ @RXCPHYPERTENSION @ @RXCPHYPERLIPIDEMIA @  Patient Goals/Self-Care Activities . Over the next 90 days, patient will:  - take medications as prescribed check glucose Daily, document, and provide at future appointments collaborate with provider on medication access solutions  Follow Up Plan: The care management team will reach out to the patient again over the next 90 days.     Task: Mutually Develop and Royce Macadamia Achievement of Patient Goals   Note:   Care Management Activities:    - verbalization of  feelings encouraged    Notes:

## 2020-12-06 NOTE — Patient Outreach (Signed)
Medicaid Managed Care    Pharmacy Note  12/06/2020 Name: Tina [name] MRN: [id number] DOB: [birth year]  Tina [name] is a 50 y.o. year old female who is a primary care patient of [name], MD. The [facility] Managed Care Coordination team was consulted for assistance with disease management and care coordination needs.    Engaged with patient Engaged with patient by telephone for follow up visit in response to referral for case management and/or care coordination services.  [name] was given information about Managed Medicaid Care Coordination team services today. Tina [name] agreed to services and verbal consent obtained.   Objective:  Lab Results  Component Value Date   CREATININE 0.79 12/05/2020   CREATININE 0.71 08/08/2020   CREATININE 0.82 01/07/2019    Lab Results  Component Value Date   HGBA1C 7.1 (A) 12/05/2020       Component Value Date/Time   CHOL 154 08/08/2020 0945   TRIG 142 08/08/2020 0945   HDL 35 (L) 08/08/2020 0945   CHOLHDL 4.4 08/08/2020 0945   CHOLHDL 7.9 08/21/2014 1512   VLDL 50 (H) 08/21/2014 1512   LDLCALC 94 08/08/2020 0945    Other: (TSH, CBC, Vit D, etc.)  Clinical ASCVD: No  The ASCVD Risk score Mikey Bussing DC Jr., et al., 2013) failed to calculate for the following reasons:   Unable to determine if patient is Non-Hispanic African American    Other: (CHADS2VASc if Afib, PHQ9 if depression, MMRC or CAT for COPD, ACT, DEXA)  BP Readings from Last 3 Encounters:  12/05/20 (!) 150/75  08/08/20 130/72  07/30/20 (!) 146/64    Assessment/Interventions: Review of patient past medical history, allergies, medications, health status, including review of consultants reports, laboratory and other test data, was performed as part of comprehensive evaluation and provision of chronic care management services.   Cholesterol Lab Results  Component Value Date   CHOL 154 08/08/2020   CHOL 178 01/07/2019    CHOL 211 (H) 01/21/2016   Lab Results  Component Value Date   HDL 35 (L) 08/08/2020   HDL 41 01/07/2019   HDL 28 (L) 01/21/2016   Lab Results  Component Value Date   LDLCALC 94 08/08/2020   LDLCALC 96 01/07/2019   LDLCALC 115 (H) 01/21/2016   Lab Results  Component Value Date   TRIG 142 08/08/2020   TRIG 207 (H) 01/07/2019   TRIG 342 (H) 01/21/2016   Lab Results  Component Value Date   CHOLHDL 4.4 08/08/2020   CHOLHDL 4.3 01/07/2019   CHOLHDL 7.5 (H) 01/21/2016   No results found for: LDLDIRECT  Atorvastatin Plan: At goal,  patient stable/ symptoms controlled   Mood Depression screen [facility] 2/9 12/05/2020 10/25/2020 08/08/2020  Decreased Interest '1 2 3  ' Down, Depressed, Hopeless '3 3 3  ' PHQ - 2 Score '4 5 6  ' Altered sleeping '3 3 2  ' Tired, decreased energy '2 2 2  ' Change in appetite '3 2 2  ' Feeling bad or failure about yourself  2 1 0  Trouble concentrating '1 3 3  ' Moving slowly or fidgety/restless 2 1 0  Suicidal thoughts 1 0 0  PHQ-9 Score '18 17 15  ' Difficult doing work/chores Very difficult Very difficult Somewhat difficult  Some encounter information is confidential and restricted. Go to Review Flowsheets activity to see all data.  Some recent data might be hidden   Buspirone 59m TID Lamotrigine 28mSertraline 5018mlan: Patient under a lot of stress due to situation with mother but her  spirits were definitely improved with the great news about her A1c. She would not like to change therapy at this time   DM Lab Results  Component Value Date   HGBA1C 7.1 (A) 12/05/2020   HGBA1C >14.0 (A) 07/30/2020   HGBA1C 12.9 (A) 10/10/2019   Lab Results  Component Value Date   MICROALBUR 1.89 01/25/2014   LDLCALC 94 08/08/2020   CREATININE 0.79 12/05/2020    Lab Results  Component Value Date   NA 141 12/05/2020   K 4.5 12/05/2020   CREATININE 0.79 12/05/2020   GFRNONAA 87 12/05/2020   GFRAA 100 12/05/2020   GLUCOSE 140 (H) 12/05/2020   Canagliflozin 137m  QD Aspart 10units TID Glargine 35 Units Sitagliptin 536mMetformin 100059mID December 2021: Patient needs Internal Medicine Program PCP to write scripts that says, "IM Program" on script. This didn't happen and she was unable to get pills, coordinated with PCP's and will get this taken care of Jan 2022: Able to get medicines but now, Lost monitor when moved house to house Feb 2022: A1c at goal, having monitor and access to medications has brought it down. Hopefully the patient can have foot Sx now that A1c is in range  Insomnia -Under a lot of stress, patient's mother is in hospice Trazodone 79m65man: At goal,  patient stable/ symptoms controlled    HTN BP Readings from Last 3 Encounters:  12/05/20 (!) 150/75  08/08/20 130/72  07/30/20 (!) 146/64   Enalapril 20mg35mn: BP was elevated at last visit but due to paitent's pain (Ankle) and mother in hospice, PCP elected to wait 1 month and see. I agree with this plan and will f/u at my appt in 3 months as well  Pain Pain scale; Patient didn't not give pain scale today, every time I asked she dodged the question Cyclobenzaprine Gabapentin 300mg 81m: She sees dr. Next month who can hopefully do Sx now that A1c is at goal   SDOH (Social Determinants of Health) assessments and interventions performed:    Care Plan  Allergies  Allergen Reactions  . Codeine Other (See Comments)    hallucinations    Medications Reviewed Today    Reviewed by Steen,Madalyn RobResident) on 12/05/20 at 2215  Med List Status: <None>  Medication Order Taking? Sig Documenting Provider Last Dose Status Informant  ARIPiprazole (ABILIFY) 5 MG tablet [id number] 1 tablet (5 mg total) by mouth daily. ParsonSalley SlaughterActive   atorvastatin (LIPITOR) 40 MG tablet [id number] 1 tablet (40 mg total) by mouth daily. MullenSid FalconActive   Blood Glucose Monitoring Suppl (CONTOUR NEXT ONE) KIT [id number] by Does not apply route See  admin instructions. USE TO CHECK BLOOD GLUCOSE ONCE DAILY. IM PROGRAM. RathorShela Leffaking Active   busPIRone (BUSPAR) 10 MG tablet [id number] 1 tablet (10 mg total) by mouth 3 (three) times daily. ParsonSalley SlaughterActive   canagliflozin (INVOKClay Surgery CenterMG TABS tablet [id number] 1 tablet (100 mg total) by mouth daily before breakfast. Lee, JMosetta Anisaking Active   enalapril (VASOTEC) 20 MG tablet [id number] 1 TABLET (20 MG TOTAL) BY MOUTH DAILY. Chen, Andrew Auaking Active   gabapentin (NEURONTIN) 300 MG capsule [id number] 1 capsule (300 mg total) by mouth 3 (three) times daily for 3 days, THEN 2 capsules (600 mg total) 3 (three) times daily. ChristMitzi Hansenaking  Expired 10/31/20 2359   glucose blood (CONTOUR NEXT TEST) test strip [id number] No Please use to check your sugar 3 times a day. Welford Roche, MD Taking Active   hydrOXYzine (ATARAX/VISTARIL) 25 MG tablet [id number]  Take 1 tablet (25 mg total) by mouth 3 (three) times daily. Salley Slaughter, NP  Active   ibuprofen (ADVIL) 800 MG tablet [id number] No Take 1 tablet (800 mg total) by mouth every 8 (eight) hours as needed. [name], MD Taking Active   ibuprofen (ADVIL) 800 MG tablet [id number]  Take 1 tablet (800 mg total) by mouth every 8 (eight) hours as needed. [name], DPM  Active   insulin aspart (NOVOLOG FLEXPEN) 100 UNIT/ML FlexPen [id number]  Inject 10 Units into the skin 3 (three) times daily with meals. INJECT 10 UNITS INTO THE SKIN 3 TIMES DAILY WITH MEALS. [name], MD  Active   insulin glargine (LANTUS SOLOSTAR) 100 UNIT/ML Solostar Pen [id number]  Inject 35 Units into the skin daily at 10 pm. Modena Nunnery D, DO  Active   Insulin Pen Needle (UNIFINE PENTIPS) 31G X 5 MM MISC [id number] No Inject 1 Units into the skin 3 (three) times daily. Use to inject insulin 4 times daily. IM PROGRAM Christian, [name], MD Taking Active   lamoTRIgine  (LAMICTAL) 25 MG tablet [id number]  Take 2 tablets (50 mg total) by mouth daily. Salley Slaughter, NP  Active   Lancets MISC [id number] No 1 Units by Does not apply route 3 (three) times daily. Welford Roche, MD Taking Active   Multiple Vitamins-Minerals (MULTIVITAMIN WITH MINERALS) tablet [id number] No Take 1 tablet by mouth daily. [provider] Taking Active   sertraline (ZOLOFT) 50 MG tablet [id number]  Take 1 tablet (50 mg total) by mouth daily. Salley Slaughter, NP  Active   sitaGLIPtin-metformin (JANUMET) 50-1000 MG tablet [id number]  TAKE 1 TABLET BY MOUTH 2 TIMES DAILY WITH A MEAL. Modena Nunnery D, DO  Active   traZODone (DESYREL) 150 MG tablet [id number]  Take 1 tablet (150 mg total) by mouth at bedtime as needed for sleep. Salley Slaughter, NP  Active           Patient Active Problem List   Diagnosis Date Noted  . Generalized anxiety disorder 11/12/2020  . Severe recurrent major depression with psychotic features ([place]) 10/04/2020  . Bipolar 2 disorder, major depressive episode ([place]) 10/04/2020  . Pain in both feet 10/10/2019  . Fibroids 04/14/2019  . Financial difficulties 04/13/2018  . Seasonal allergies 02/06/2017  . Osteoarthritis of right ankle 08/29/2016  . Tobacco abuse 01/21/2016  . Severe obesity (BMI 35.0-39.9) 10/12/2015  . Foot ulcer ([place]) 09/13/2015  . Ovarian cyst 12/24/2012  . Right knee pain 12/09/2012  . Hypertension 07/20/2012  . Healthcare maintenance 06/27/2011  . Uncontrolled type 2 diabetes mellitus ([place]) 05/30/2010  . Dyslipidemia 05/30/2010  . Depression 05/30/2010  . 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    Conditions to be addressed/monitored: HTN, HLD and DM  Patient Care Plan: Medication Management    Problem Identified: Health Promotion or Disease Self-Management (General Plan of Care)     Goal: Medication Management   Note:   Current Barriers:  . Unable to independently afford treatment  regimen . Does not contact provider office for questions/concerns .   Pharmacist Clinical Goal(s):  Marland Kitchen Over the next 90 days, patient will maintain control of DM as evidenced by Current A1c  . contact provider office for questions/concerns as evidenced notation of  same in electronic health record through collaboration with PharmD and provider.  .   Interventions: . Inter-disciplinary care team collaboration (see longitudinal plan of care) . Comprehensive medication review performed; medication list updated in electronic medical record  '@RXCPDIABETES' @ '@RXCPHYPERTENSION' @ '@RXCPHYPERLIPIDEMIA' @  Patient Goals/Self-Care Activities . Over the next 90 days, patient will:  - take medications as prescribed check glucose Daily, document, and provide at future appointments collaborate with provider on medication access solutions  Follow Up Plan: The care management team will reach out to the patient again over the next 90 days.     Task: Mutually Develop and Royce Macadamia Achievement of Patient Goals   Note:   Care Management Activities:    - verbalization of feelings encouraged    Notes:      Medication Assistance: None required. Patient affirms current coverage meets needs.   Follow up: Agree   Plan: The care management team will reach out to the patient again over the next 90 days.   [place] Constable, Pharm.D., Managed Medicaid Pharmacist - [phone]

## 2020-12-07 ENCOUNTER — Telehealth: Payer: Self-pay

## 2020-12-07 ENCOUNTER — Other Ambulatory Visit: Payer: Self-pay

## 2020-12-07 MED ORDER — INVOKAMET 150-1000 MG PO TABS: 1.0000 | ORAL_TABLET | Freq: Two times a day (BID) | ORAL | 6 refills | Status: DC

## 2020-12-07 MED ORDER — OZEMPIC (0.25 OR 0.5 MG/DOSE) 2 MG/1.5ML ~~LOC~~ SOPN: 0.5000 mg | PEN_INJECTOR | SUBCUTANEOUS | 2 refills | Status: DC

## 2020-12-07 MED ORDER — OZEMPIC (0.25 OR 0.5 MG/DOSE) 2 MG/1.5ML ~~LOC~~ SOPN: 0.2500 mg | PEN_INJECTOR | SUBCUTANEOUS | 0 refills | Status: DC

## 2020-12-07 NOTE — Telephone Encounter (Signed)
Opened in error

## 2020-12-07 NOTE — Patient Instructions (Signed)
Visit Information  Ms. [employer]  - as a part of your Medicaid benefit, you are eligible for care management and care coordination services at no cost or copay. I was unable to reach you by phone today but would be happy to help you with your health related needs. Please feel free to call me @ [phone].   A member of the Managed Medicaid care management team will reach out to you again over the next 30 days.   Mickel Fuchs, BSW, [place]  High Risk Managed Medicaid Team

## 2020-12-07 NOTE — Patient Outreach (Signed)
Care Coordination  12/07/2020  [place] Dec 16, 1970 [id number]   Medicaid Managed Care   Unsuccessful Outreach Note  12/07/2020 Name: Tina [name] MRN: [id number] DOB: Nov 17, 1970  Referred by: [name], MD Reason for referral : High Risk Managed Medicaid (HR MM Unsuccessful Telephone Outreach)   An unsuccessful telephone outreach was attempted today. The patient was referred to the case management team for assistance with care management and care coordination.   Follow Up Plan: The care management team will reach out to the patient again over the next 30 days.   Mickel Fuchs, BSW, [place]  High Risk Managed Medicaid Team

## 2020-12-07 NOTE — Addendum Note (Signed)
Addended by: Lyndal Pulley on: 12/07/2020 03:20 PM   Modules accepted: Orders

## 2020-12-10 NOTE — Progress Notes (Signed)
Internal Medicine Clinic Attending  Case discussed with Dr. Steen  At the time of the visit.  We reviewed the resident's history and exam and pertinent patient test results.  I agree with the assessment, diagnosis, and plan of care documented in the resident's note.  

## 2020-12-18 ENCOUNTER — Other Ambulatory Visit: Payer: Self-pay

## 2020-12-18 ENCOUNTER — Other Ambulatory Visit: Payer: Self-pay | Admitting: Psychiatry

## 2020-12-18 ENCOUNTER — Telehealth (INDEPENDENT_AMBULATORY_CARE_PROVIDER_SITE_OTHER): Payer: Medicaid Other | Admitting: Psychiatry

## 2020-12-18 ENCOUNTER — Encounter (HOSPITAL_COMMUNITY): Payer: Self-pay | Admitting: Psychiatry

## 2020-12-18 DIAGNOSIS — M5116 Intervertebral disc disorders with radiculopathy, lumbar region: Secondary | ICD-10-CM | POA: Diagnosis not present

## 2020-12-18 DIAGNOSIS — F411 Generalized anxiety disorder: Secondary | ICD-10-CM

## 2020-12-18 DIAGNOSIS — F333 Major depressive disorder, recurrent, severe with psychotic symptoms: Secondary | ICD-10-CM

## 2020-12-18 DIAGNOSIS — F3181 Bipolar II disorder: Secondary | ICD-10-CM

## 2020-12-18 MED ORDER — TRAZODONE HCL 100 MG PO TABS: 200.0000 mg | ORAL_TABLET | Freq: Every evening | ORAL | 2 refills | Status: DC | PRN

## 2020-12-18 MED ORDER — BUSPIRONE HCL 10 MG PO TABS: 10.0000 mg | ORAL_TABLET | Freq: Three times a day (TID) | ORAL | 2 refills | Status: DC

## 2020-12-18 MED ORDER — GABAPENTIN 300 MG PO CAPS
ORAL_CAPSULE | ORAL | 0 refills | Status: DC
Start: 2020-12-18 — End: 2021-11-11

## 2020-12-18 MED ORDER — LAMOTRIGINE 25 MG PO TABS: 50.0000 mg | ORAL_TABLET | Freq: Every day | ORAL | 2 refills | Status: DC

## 2020-12-18 MED ORDER — HYDROXYZINE HCL 25 MG PO TABS: 25.0000 mg | ORAL_TABLET | Freq: Three times a day (TID) | ORAL | 3 refills | Status: DC

## 2020-12-18 MED ORDER — SERTRALINE HCL 50 MG PO TABS: 50.0000 mg | ORAL_TABLET | Freq: Every day | ORAL | 2 refills | Status: DC

## 2020-12-18 MED ORDER — ARIPIPRAZOLE 5 MG PO TABS
5.0000 mg | ORAL_TABLET | Freq: Every day | ORAL | 2 refills | Status: DC
  Filled 2021-02-20: qty 30, 30d supply, fill #0

## 2020-12-18 MED FILL — ?TRAZODONE HCL 50 TABS: 50 | 20 days supply | Qty: 83 | Fill #0

## 2020-12-18 MED FILL — ?ARIPIPRAZOLE 5MG TABLET: 5 | 30 days supply | Qty: 30 | Fill #0

## 2020-12-18 MED FILL — hydrOXYzine HCL 25 MG TABS: 25 | 30 days supply | Qty: 90 | Fill #0

## 2020-12-18 MED FILL — GABAPENTIN 300 MG CAPSULE: 300 | 30 days supply | Qty: 171 | Fill #0

## 2020-12-18 MED FILL — ?BUSPIRONE HCL 5 MG TABLET: 5 | 30 days supply | Qty: 180 | Fill #0

## 2020-12-18 MED FILL — lamoTRIgine 25 MG TABS: 25 | 30 days supply | Qty: 60 | Fill #0

## 2020-12-18 MED FILL — ?SERTRALINE HCL 50 MG TABS: 50 | 30 days supply | Qty: 30 | Fill #0

## 2020-12-18 NOTE — Progress Notes (Signed)
[facility] MD/PA/NP OP Progress Note Virtual Visit via Telephone Note  I connected with Tina [name] on 12/18/20 at 10:30 AM EST by telephone and verified that I am speaking with the correct person using two identifiers.  Location: Patient: home Provider: Clinic   I discussed the limitations, risks, security and privacy concerns of performing an evaluation and management service by telephone and the availability of in person appointments. I also discussed with the patient that there may be a patient responsible charge related to this service. The patient expressed understanding and agreed to proceed.   I provided 30 minutes of non-face-to-face time during this encounter.   12/18/2020 12:52 PM [name]  MRN:  [id number]  Chief Complaint: "Things are bitter sweet"  HPI: 50 year old female seen today for follow up psychiatric evaluation.   She has a psychiatric history of anxiety, depression, and bipolar 2 disorder.  She is currently managed on Lamictal 75 mg daily, trazodone 150 mg nightly, Zoloft 50 mg daily, Abilify 5 mg daily, and BuSpar 10 mg 3 times daily.  Patient also takes gabapentin 300 mg 3 times daily which she received from her PCP.   She notes that she has been only taking Lamictal 50 mg and reports that she takes Abilify periodically.  Patient noted that  her medications are somewhat effective in managing her psychiatric conditions.   Today patient unable to log on virtually so the call was done over the phone. During assessment she was pleasant, cooperative, and engaged in conversation.  She informed provider that  she has been more anxious and depressed lately.  She informed provider that she forgot to taper her Lamictal dose up and has only been taking 50 mg.  She also informed provider that she takes Abilify periodically because she fears that it will increase her blood sugar.  Provider informed patient of Abilify side effects and notes that it could  lead to metabolic conditions.  Provider also informed patient that Abilify treats her auditory hallucinations.  She notes that the benefit outweighs the risk and notes that she would like to continue Abilify.  Today provider conducted a GAD-7 and patient scored a 21, at her last visit she scored a 20.  Provider also conducted a PHQ-9 and patient scored a 24, at her last visit she scored a 22.  Patient notes that she continues to hear voices that tell her to come here or shut up.  She notes that the voices sound like her brother.  Today she denies SI/HI/VH or paranoia.   Patient notes that her appetite sleep are poor.  Patient informed provider that her anxiety and depression are exacerbated by her mother's current health.  She notes that recently her mother's nursing home team found an ulcer on her bottom which they believe may be infected.  She notes that the hospice team believes that her mother's health is declining.  Patient notes that it is very difficult for her to except her mother's condition.  She notes that she continues to care for her and sleep over at her home at night a healthcare provider at night.  She notes that her brothers are supportive in helping her take care of their mother.  Patient notes that her mood continues to fluctuate and also endorses AH.  Today she is agreeable to increasing Lamictal 50 mg to 75 mg for 2 weeks and then increasing to 100 mg to help manage mood. She is also agreeable to continue Abilify 5 mg at this  time to help manage symptoms of psychosis.  Also agreeable to increasing trazodone 150 mg to 200 mg to help manage sleep and depression.  She will continue all other medications as prescribed. She will follow up with outpatient counseling for therapy. No other concerns noted at this time.    Visit Diagnosis:    ICD-10-CM   1. Bipolar 2 disorder, major depressive episode (HCC)  F31.81 ARIPiprazole (ABILIFY) 5 MG tablet    lamoTRIgine (LAMICTAL) 25 MG tablet     traZODone (DESYREL) 100 MG tablet  2. Severe recurrent major depression with psychotic features (HCC)  F33.3 busPIRone (BUSPAR) 10 MG tablet    sertraline (ZOLOFT) 50 MG tablet  3. Lumbar disc herniation with radiculopathy  M51.16 gabapentin (NEURONTIN) 300 MG capsule  4. Generalized anxiety disorder  F41.1 hydrOXYzine (ATARAX/VISTARIL) 25 MG tablet    sertraline (ZOLOFT) 50 MG tablet    Past Psychiatric History: anxiety, depression, and bipolar 2 disorder Past Medical History:  Past Medical History:  Diagnosis Date  . Diabetes mellitus    type II  . Fibroids   . HTN (hypertension)   . Hyperlipidemia   . Iron deficiency anemia   . Left acetabular fracture ([place])   . Lumbar strain   . Obesity   . Ovarian cyst     Past Surgical History:  Procedure Laterality Date  . FOOT ARTHRODESIS, MODIFIED MCBRIDE     right foot bunion surgery and ankle surgery 1980s  . 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 Colon cancer Sister   . Diabetes Mother   . Diabetes Father   . Diabetes Brother   . Celiac disease Paternal Aunt     Social History:  Social History   Socioeconomic History  . Marital status: Single    Spouse name: Not on file  . Number of children: 0  . Years of education: 71  . Highest education level: Some college, no degree  Occupational History  . Occupation: Engineer, materials: UNEMPLOYED    Employer: [employer], [employer]  Tobacco Use  . Smoking status: Current Every Day Smoker    Packs/day: 0.50    Years: 23.00    Pack years: 11.50    Types: Cigarettes  . Smokeless tobacco: Never Used  . Tobacco comment: 1/2PPD  Vaping Use  . Vaping Use: Never used  Substance and Sexual Activity  . Alcohol use: Yes    Alcohol/week: 0.0 standard drinks    Comment: occ  . Drug use: No  . Sexual activity: Yes    Partners: Female    Birth  control/protection: Injection    Comment: Depo  Other Topics Concern  . Not on file  Social History Narrative   Homosexual; in a monogamous relationship w/ homosexual woman.   She is disabled.  She last worked in 2018 at [employer].    Right handed   One story home   Social Determinants of Health   Financial Resource Strain: High Risk  . Difficulty of Paying Living Expenses: Hard  Food Insecurity: No Food Insecurity  . Worried About Charity fundraiser in the Last Year: Never true  . Ran Out of Food in the Last Year: Never true  Transportation Needs: No Transportation Needs  . Lack of Transportation (Medical): No  . Lack of Transportation (Non-Medical): No  Physical Activity:  Inactive  . Days of Exercise per Week: 0 days  . Minutes of Exercise per Session: 0 min  Stress: Stress Concern Present  . Feeling of Stress : Very much  Social Connections: Moderately Isolated  . Frequency of Communication with Friends and Family: More than three times a week  . Frequency of Social Gatherings with Friends and Family: More than three times a week  . Attends Religious Services: More than 4 times per year  . Active Member of Clubs or Organizations: No  . Attends Archivist Meetings: Never  . Marital Status: Never married    Allergies:  Allergies  Allergen Reactions  . Codeine Other (See Comments)    hallucinations    Metabolic Disorder Labs: Lab Results  Component Value Date   HGBA1C 7.1 (A) 12/05/2020   MPG 200 05/25/2017   MPG 237 02/26/2016   No results found for: PROLACTIN Lab Results  Component Value Date   CHOL 154 08/08/2020   TRIG 142 08/08/2020   HDL 35 (L) 08/08/2020   CHOLHDL 4.4 08/08/2020   VLDL 50 (H) 08/21/2014   LDLCALC 94 08/08/2020   [place] 96 01/07/2019   Lab Results  Component Value Date   TSH 1.074 06/27/2011    Therapeutic Level Labs: No results found for: LITHIUM No results found for: VALPROATE No components found for:   CBMZ  Current Medications: Current Outpatient Medications  Medication Sig Dispense Refill  . ARIPiprazole (ABILIFY) 5 MG tablet Take 1 tablet (5 mg total) by mouth daily. 30 tablet 2  . atorvastatin (LIPITOR) 40 MG tablet Take 1 tablet (40 mg total) by mouth daily. 30 tablet 11  . Blood Glucose Monitoring Suppl (CONTOUR NEXT ONE) KIT 1 each by Does not apply route See admin instructions. USE TO CHECK BLOOD GLUCOSE ONCE DAILY. IM PROGRAM. 1 kit 0  . busPIRone (BUSPAR) 10 MG tablet Take 1 tablet (10 mg total) by mouth 3 (three) times daily. 90 tablet 2  . Canagliflozin-metFORMIN HCl (INVOKAMET) [phone] MG TABS Take 1 tablet by mouth 2 (two) times daily. 60 tablet 6  . enalapril (VASOTEC) 20 MG tablet TAKE 1 TABLET (20 MG TOTAL) BY MOUTH DAILY. 90 tablet 3  . gabapentin (NEURONTIN) 300 MG capsule Take 1 capsule (300 mg total) by mouth 3 (three) times daily for 3 days, THEN 2 capsules (600 mg total) 3 (three) times daily. 549 capsule 0  . glucose blood (CONTOUR NEXT TEST) test strip Please use to check your sugar 3 times a day. 100 strip 3  . hydrOXYzine (ATARAX/VISTARIL) 25 MG tablet Take 1 tablet (25 mg total) by mouth 3 (three) times daily. 90 tablet 3  . ibuprofen (ADVIL) 800 MG tablet Take 1 tablet (800 mg total) by mouth every 8 (eight) hours as needed. 40 tablet 0  . ibuprofen (ADVIL) 800 MG tablet Take 1 tablet (800 mg total) by mouth every 8 (eight) hours as needed. 30 tablet 0  . insulin aspart (NOVOLOG FLEXPEN) 100 UNIT/ML FlexPen Inject 10 Units into the skin 3 (three) times daily with meals. INJECT 10 UNITS INTO THE SKIN 3 TIMES DAILY WITH MEALS. 12 mL 3  . insulin glargine (LANTUS SOLOSTAR) 100 UNIT/ML Solostar Pen Inject 35 Units into the skin daily at 10 pm. 3 mL 2  . Insulin Pen Needle (UNIFINE PENTIPS) 31G X 5 MM MISC Inject 1 Units into the skin 3 (three) times daily. Use to inject insulin 4 times daily. IM PROGRAM 130 each 5  . lamoTRIgine (LAMICTAL)  25 MG tablet Take 2 tablets  (50 mg total) by mouth daily. 120 tablet 2  . Lancets MISC 1 Units by Does not apply route 3 (three) times daily. 100 each 3  . Multiple Vitamins-Minerals (MULTIVITAMIN WITH MINERALS) tablet Take 1 tablet by mouth daily.    . Semaglutide,0.25 or 0.5MG/DOS, (OZEMPIC, 0.25 OR 0.5 MG/DOSE,) 2 MG/1.5ML SOPN Inject 0.25 mg as directed once a week. 0.75 mL 0  . Semaglutide,0.25 or 0.5MG/DOS, (OZEMPIC, 0.25 OR 0.5 MG/DOSE,) 2 MG/1.5ML SOPN Inject 0.5 mg into the skin once a week. After one month of .25 mg dose, start injecting .49m dose into the skin once a week 1.5 mL 2  . sertraline (ZOLOFT) 50 MG tablet Take 1 tablet (50 mg total) by mouth daily. 90 tablet 2  . traZODone (DESYREL) 100 MG tablet Take 2 tablets (200 mg total) by mouth at bedtime as needed for sleep. 60 tablet 2   No current facility-administered medications for this visit.     Musculoskeletal: Strength & Muscle Tone: Unable to assess due to telephone visit Gait & Station: Unable to assess due to telephone visit Patient leans: N/A  Psychiatric Specialty Exam: Review of Systems  There were no vitals taken for this visit.There is no height or weight on file to calculate BMI.  General Appearance: Unable to assess due to telephone visit  Eye Contact:  Unable to assess due to telephone visit  Speech:  Clear and Coherent and Normal Rate  Volume:  Normal  Mood:  Anxious and Depressed  Affect:  Appropriate and Congruent  Thought Process:  Coherent, Goal Directed and Linear  Orientation:  Full (Time, Place, and Person)  Thought Content: Logical and Hallucinations: Auditory Visual   Suicidal Thoughts:  No  Homicidal Thoughts:  No  Memory:  Immediate;   Good Recent;   Good Remote;   Good  Judgement:  Good  Insight:  Good  Psychomotor Activity:  Normal  Concentration:  Concentration: Good and Attention Span: Good  Recall:  Good  Fund of Knowledge: Good  Language: Good  Akathisia:  No  Handed:  Right  AIMS (if indicated):Not  done  Assets:  Communication Skills Desire for Improvement Financial Resources/Insurance Housing Social Support  ADL's:  Intact  Cognition: WNL  Sleep:  Poor   Screenings: GAD-7   Flowsheet Row [facility]2020 [facility]/2020 [facility] Visit from 10/04/2020 in [place]2016 [facility] [place]al[place]for [facility]Office [facility] from 06/12/2016 [facility] CRonceverte[facility] Total GAD-7 Score '21 20 21 13 16    ' PHQ2-9   Flowsheet Row Video Visit from 12/18/2020 in [facility] Visit from 12/05/2020 in ML[place]Video Visit from 11/12/2020 in [facility] Telephone from 10/25/2020 in [place]Video [place] from 10/04/2020 in [place] PHQ-2 Total Score '6 4 6 5 6  ' PHQ-9 Total Score '24 18 22 17 23    ' Flowsheet Row Video Visit from 12/18/2020 in GAyrError: Q2 is Yes, you must answer 3, 4, and 5       Assessment and Plan: Patient endorses symptoms of anxiety, depression, insomnia, and VAH.  She informed provider that she did not increase Lamictal 50 mg to 75 mg.  Today she is agreeable to increasing Lamictal 50 mg to 75 mg for 2 weeks and then increasing to 100 mg to help manage mood. She is also agreeable to continue Abilify 5 mg at this time to help manage symptoms of psychosis.  She also is agreeable to increasing trazodone 150 mg to 200 mg to help manage sleep and depression.  1. Bipolar 2 disorder, major depressive episode (HCC)  Continue- ARIPiprazole (ABILIFY) 5 MG tablet; Take 1 tablet (5 mg total) by mouth daily.  Dispense: 30 tablet; Refill: 2 Increased- lamoTRIgine (LAMICTAL)  25 MG tablet; Take 2 tablets (50 mg total) by mouth daily.  Dispense: 120 tablet; Refill: 2 Increased- traZODone (DESYREL) 100 MG tablet; Take 2 tablets (200 mg total) by mouth at bedtime as needed for sleep.  Dispense: 60 tablet; Refill: 2  2. Severe recurrent major depression with psychotic features (HCC)  Continue- busPIRone (BUSPAR) 10 MG tablet; Take 1 tablet (10 mg total) by mouth 3 (three) times daily.  Dispense: 90 tablet; Refill: 2 Continue- sertraline (ZOLOFT) 50 MG tablet; Take 1 tablet (50 mg total) by mouth daily.  Dispense: 90 tablet; Refill: 2  3. Lumbar disc herniation with radiculopathy  Continue (ONE TIME REFILL)- gabapentin (NEURONTIN) 300 MG capsule; Take 1 capsule (300 mg total) by mouth 3 (three) times daily for 3 days, THEN 2 capsules (600 mg total) 3 (three) times daily.  Dispense: 549 capsule; Refill: 0  4. Generalized anxiety disorder  Continue- hydrOXYzine (ATARAX/VISTARIL) 25 MG tablet; Take 1 tablet (25 mg total) by mouth 3 (three) times daily.  Dispense: 90 tablet; Refill: 3 Continue- sertraline (ZOLOFT) 50 MG tablet; Take 1 tablet (50 mg total) by mouth daily.  Dispense: 90 tablet; Refill: 2    Follow up in 3 month  Follow up with therapy Salley Slaughter, NP 12/18/2020, 12:52 PM

## 2020-12-21 MED FILL — $novoLOG FLEXPEN SYRINGE: 100 | 30 days supply | Qty: 9 | Fill #1

## 2020-12-21 MED FILL — ENALAPRIL MALEATE 20 MG TAB: 20 | 30 days supply | Qty: 30 | Fill #0

## 2020-12-21 MED FILL — $LANTUS SOLOSTAR 100 UNITS/: 100 | 25 days supply | Qty: 9 | Fill #1

## 2020-12-21 MED FILL — ?ATORVASTATIN 20 MG TABLET: 20 | 30 days supply | Qty: 60 | Fill #1

## 2020-12-25 ENCOUNTER — Ambulatory Visit: Payer: Self-pay

## 2020-12-25 ENCOUNTER — Ambulatory Visit (INDEPENDENT_AMBULATORY_CARE_PROVIDER_SITE_OTHER): Payer: Medicaid Other

## 2020-12-25 DIAGNOSIS — Z309 Encounter for contraceptive management, unspecified: Secondary | ICD-10-CM

## 2020-12-25 LAB — POCT URINE PREGNANCY: Preg Test, Ur: NEGATIVE

## 2020-12-25 MED ORDER — MEDROXYPROGESTERONE ACETATE 104 MG/0.65ML ~~LOC~~ SUSY
104.0000 mg | PREFILLED_SYRINGE | Freq: Once | SUBCUTANEOUS | Status: AC
Start: 2020-12-25 — End: 2020-12-25
  Administered 2020-12-25: 104 mg via SUBCUTANEOUS

## 2020-12-26 ENCOUNTER — Other Ambulatory Visit: Payer: Self-pay

## 2020-12-26 ENCOUNTER — Ambulatory Visit (INDEPENDENT_AMBULATORY_CARE_PROVIDER_SITE_OTHER): Payer: Medicaid Other | Admitting: Behavioral Health

## 2020-12-26 DIAGNOSIS — F3181 Bipolar II disorder: Secondary | ICD-10-CM

## 2020-12-26 NOTE — Progress Notes (Signed)
   THERAPIST PROGRESS NOTE  Virtual Visit via Telephone Note  I connected with Tina [name] on 12/26/20 at  2:00 PM EST by telephone and verified that I am speaking with the correct person using two identifiers.  Location: Patient: Home Provider: Clinical Home - [facility]   I discussed the limitations, risks, security and privacy concerns of performing an evaluation and management service by telephone and the availability of in person appointments. I also discussed with the patient that there may be a patient responsible charge related to this service. The patient expressed understanding and agreed to proceed.  I discussed the assessment and treatment plan with the patient. The patient was provided an opportunity to ask questions and all were answered. The patient agreed with the plan and demonstrated an understanding of the instructions.   The patient was advised to call back or seek an in-person evaluation if the symptoms worsen or if the condition fails to improve as anticipated.  I provided 50 minutes of non-face-to-face time during this encounter.   Allyne Gee, Counselor   Session Time: 2:00PM  Participation Level: Active  Behavioral Response: NAAlertPleasant  Type of Therapy: Individual Therapy  Treatment Goals addressed: Coping  Interventions: CBT and Supportive  Summary: Tina [name] is a 50 y.o. female who presents today for a scheduled virtual individual therapy session via telephone. Clt started session by stating, "I have a headache after trying to help my mom when her toenail fell off. It freaked me out ... it was bleeding and hanging onto the skin". Clt reports she did not sleep well last night. She has been trying to reduce her stress by "Playing games on my phone. I play Spades, Candy Crush and a game called Find the Differences". She reports this has help to reduce her stress significantly. Tina [name] reports she received good news  from her doctor that her A1C decreased from 14 to 7.1. Clt was elated as she shared this information with this Probation officer. She also shared that she was informed that she can now receive her medications at no cost to her. Clt kept repeating "I want to life and not die!" She reports having an awakening moment when her doctor told her she was at risk for dying if she did not make changes to her health and eating habits. Clt has incorporated a goal to walk around her house 4 times a day (twice in the AM and twice in the PM) as a form of exercise. She also reports she has stopped drinking sodas and limiting her sugar intake.  Suicidal/Homicidal: No  Therapist Response: Therapist offered encouragement and support. Therapist encouraged self-care techniques and setting healthy boundaries with family members. Therapist reviewed physical stress signals - recognizing when she is at her "stress point".   Plan: Return again in 2 weeks.  Diagnosis: Axis I: Bipolar, Depressed    Axis II: No diagnosis    Allyne Gee, Counselor 12/26/2020

## 2020-12-27 ENCOUNTER — Other Ambulatory Visit: Payer: Self-pay

## 2020-12-27 ENCOUNTER — Other Ambulatory Visit: Payer: Self-pay | Admitting: Obstetrics and Gynecology

## 2020-12-27 NOTE — Patient Outreach (Signed)
Medicaid Managed Care   Nurse Care Manager Note  12/27/2020 Name:  Tina [name] MRN:  [id number] DOB:  Nov 20, 1970  Tina [name] is an 50 y.o. year old female who is a primary patient of [name], MD.  The [facility] Managed Care Coordination team was consulted for assistance with:chronic healthcare management needs.  [name] was given information about Medicaid Managed Care Coordination team services today. Tina [name] agreed to services and verbal consent obtained.  Engaged with patient by telephone for follow up visit in response to provider referral for case management and/or care coordination services.   Assessments/Interventions:  Review of past medical history, allergies, medications, health status, including review of consultants reports, laboratory and other test data, was performed as part of comprehensive evaluation and provision of chronic care management services.  SDOH (Social Determinants of Health) assessments and interventions performed:   Care Plan  Allergies  Allergen Reactions  . Codeine Other (See Comments)    hallucinations    Medications Reviewed Today    Reviewed by Gayla Medicus, RN (Registered Nurse) on 12/27/20 at [place] List Status: <None>  Medication Order Taking? Sig Documenting Provider Last Dose Status Informant  ARIPiprazole (ABILIFY) 5 MG tablet [id number]  Take 1 tablet (5 mg total) by mouth daily. Salley Slaughter, NP  Active   atorvastatin (LIPITOR) 40 MG tablet [id number]  Take 1 tablet (40 mg total) by mouth daily. Sid Falcon, MD  Active   Blood Glucose Monitoring Suppl (CONTOUR NEXT ONE) KIT [id number] No 1 each by Does not apply route See admin instructions. USE TO CHECK BLOOD GLUCOSE ONCE DAILY. IM PROGRAM. [name], MD Taking Active   busPIRone (BUSPAR) 10 MG tablet [id number]  Take 1 tablet (10 mg total) by mouth 3 (three) times daily. Salley Slaughter, NP  Active    Canagliflozin-metFORMIN HCl (INVOKAMET) [phone] MG TABS [id number]  Take 1 tablet by mouth 2 (two) times daily. [name], MD  Active   enalapril (VASOTEC) 20 MG tablet [id number] No TAKE 1 TABLET (20 MG TOTAL) BY MOUTH DAILY. Andrew Au, MD Taking Active   gabapentin (NEURONTIN) 300 MG capsule [id number]  Take 1 capsule (300 mg total) by mouth 3 (three) times daily for 3 days, THEN 2 capsules (600 mg total) 3 (three) times daily. Eulis Canner E, NP  Active   glucose blood (CONTOUR NEXT TEST) test strip [id number] No Please use to check your sugar 3 times a day. Welford Roche, MD Taking Active   hydrOXYzine (ATARAX/VISTARIL) 25 MG tablet [id number]  Take 1 tablet (25 mg total) by mouth 3 (three) times daily. Salley Slaughter, NP  Active   ibuprofen (ADVIL) 800 MG tablet [id number] No Take 1 tablet (800 mg total) by mouth every 8 (eight) hours as needed. [name], MD Taking Active   ibuprofen (ADVIL) 800 MG tablet [id number]  Take 1 tablet (800 mg total) by mouth every 8 (eight) hours as needed. [name], DPM  Active   insulin aspart (NOVOLOG FLEXPEN) 100 UNIT/ML FlexPen [id number]  Inject 10 Units into the skin 3 (three) times daily with meals. INJECT 10 UNITS INTO THE SKIN 3 TIMES DAILY WITH MEALS. [name], MD  Active   insulin glargine (LANTUS SOLOSTAR) 100 UNIT/ML Solostar Pen [id number]  Inject 35 Units into the skin daily at 10 pm. Modena Nunnery D, DO  Active   Insulin Pen Needle (UNIFINE PENTIPS) 31G X 5 MM MISC [id number] No Inject 1 Units  into the skin 3 (three) times daily. Use to inject insulin 4 times daily. IM PROGRAM Christian, [name], MD Taking Active   lamoTRIgine (LAMICTAL) 25 MG tablet [id number]  Take 2 tablets (50 mg total) by mouth daily. Salley Slaughter, NP  Active   Lancets MISC [id number] No 1 Units by Does not apply route 3 (three) times daily. Welford Roche, MD Taking Active   Multiple Vitamins-Minerals (MULTIVITAMIN WITH  MINERALS) tablet [id number] No Take 1 tablet by mouth daily. [provider] Taking Active   Semaglutide,0.25 or 0.5MG/DOS, (OZEMPIC, 0.25 OR 0.5 MG/DOSE,) 2 MG/1.5ML SOPN [id number]  Inject 0.25 mg as directed once a week. [name], MD  Active   Semaglutide,0.25 or 0.5MG/DOS, (OZEMPIC, 0.25 OR 0.5 MG/DOSE,) 2 MG/1.5ML SOPN [id number]  Inject 0.5 mg into the skin once a week. After one month of .25 mg dose, start injecting .37m dose into the skin once a week SMadalyn Rob MD  Active   sertraline (ZOLOFT) 50 MG tablet [id number] Take 1 tablet (50 mg total) by mouth daily. PSalley Slaughter NP  Active   traZODone (DESYREL) 100 MG tablet [id number] Take 2 tablets (200 mg total) by mouth at bedtime as needed for sleep. PSalley Slaughter NP  Active           Patient Active Problem List   Diagnosis Date Noted  . Generalized anxiety disorder 11/12/2020  . Severe recurrent major depression with psychotic features ([place] 10/04/2020  . Bipolar 2 disorder, major depressive episode ([place] 10/04/2020  . Pain in both feet 10/10/2019  . Fibroids 04/14/2019  . Financial difficulties 04/13/2018  . Seasonal allergies 02/06/2017  . Osteoarthritis of right ankle 08/29/2016  . Tobacco abuse 01/21/2016  . Severe obesity (BMI 35.0-39.9) 10/12/2015  . Foot ulcer ([place] 09/13/2015  . Ovarian cyst 12/24/2012  . Right knee pain 12/09/2012  . Hypertension 07/20/2012  . Healthcare maintenance 06/27/2011  . Uncontrolled type 2 diabetes mellitus ([place] 05/30/2010  . Dyslipidemia 05/30/2010  . Depression 05/30/2010  . 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    Conditions to be addressed/monitored per PCP order:  chronic healthcare management needs-DM, HTN, depression, ankle pain.  Care Plan : Medication Management  Updates made by CGayla Medicus RN since 12/27/2020 12:00 AM    Problem: Health Promotion or Disease Self-Management (General Plan of Care)   Priority: High  Onset Date:  12/27/2020  Note:   Medicaid Managed Care (see longitudinal plan of care for additional care plan information)  Current Barriers:  . Chronic Disease Management support and education needs related to DM, HTN, ankle pain, anxiety/depression. . 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  Nurse Case Manager Clinical Goal(s):  .Marland KitchenOver the next 30 days, patient will demonstrate improved adherence to prescribed treatment plan for DM as evidenced by checking blood sugars with glucometer and continuing medications as prescribed.  Update:  Patient states she is not checking her blood sugars-cannot fine glucometer.  Encouraged patient to follow up with her provider and[facility]  Patient states she can do this. .Marland KitchenUpdate 10/11/20:  Patient states she will purchase glucometer next week. .Marland KitchenUpdate 12/27/20:  Patient states she is not checking her blood sugars.  Patient's Hgb A1C=7.1%  Interventions:  . Inter-disciplinary care team collaboration (see longitudinal plan of care) . Advised patient to check blood sugars as recommended by provider. . Provided education to patient re: importance of checking blood sugars. .Marland Kitchen  Discussed plans with patient for ongoing care management follow up and provided patient with direct contact information for care management team . Patient will check blood pressure as directed. Marland Kitchen Update 12/27/20:  Patient to follow up with provider regarding ankle pain.  Plan:  . Patient will check blood sugars with glucometer.  Patient will check blood pressure.  Patient will continue behavioral health therapy . RNCM will follow up with patient within 30 days.      Follow Up:  Patient agrees to Care Plan and Follow-up.  Plan: The Managed Medicaid care management team will reach out to the patient again over the next 30 days. and The patient has been provided with contact information for the Managed Medicaid care  management team and has been advised to call with any health related questions or concerns.  Date/time of next scheduled RN care management/care coordination outreach 01/24/21 at  230.

## 2020-12-27 NOTE — Patient Instructions (Signed)
Hi Ms. Malter, thank you for speaking with me today.  Ms. Nusbaum was given information about Medicaid Managed Ccare Team care coordination services and verbally consented to engagement with the [facility] Managed Care team.   Patient verbalizes understanding of instructions provided today.   The Managed Medicaid care management team will reach out to the patient again over the next 30 days.  The patient has been provided with contact information for the Managed Medicaid care management team and has been advised to call with any health related questions or concerns.   Aida Raider RN, BSN [place]  Triad Curator - Managed Medicaid High Risk [phone].  Following is a copy of your plan of care:  Patient Care Plan: Medication Management    Problem Identified: Health Promotion or Disease Self-Management (General Plan of Care)   Priority: High  Onset Date: 12/27/2020  Note:   Medicaid Managed Care (see longitudinal plan of care for additional care plan information)  Current Barriers:  . Chronic Disease Management support and education needs related to DM, HTN, ankle pain, anxiety/depression. . 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  Nurse Case Manager Clinical Goal(s):  Marland Kitchen Over the next 30 days, patient will demonstrate improved adherence to prescribed treatment plan for DM as evidenced by checking blood sugars with glucometer and continuing medications as prescribed.  Update:  Patient states she is not checking her blood sugars-cannot fine glucometer.  Encouraged patient to follow up with her provider and/or purchase new one at [facility].  Patient states she can do this. Marland Kitchen Update 10/11/20:  Patient states she will purchase glucometer next week. Marland Kitchen Update 12/27/20:  Patient states she is not checking her blood sugars.  Patient's Hgb A1C=7.1%  Interventions:  . Inter-disciplinary care team  collaboration (see longitudinal plan of care) . Advised patient to check blood sugars as recommended by provider. . Provided education to patient re: importance of checking blood sugars. . Discussed plans with patient for ongoing care management follow up and provided patient with direct contact information for care management team . Patient will check blood pressure as directed. Marland Kitchen Update 12/27/20:  Patient to follow up with provider regarding ankle pain.  Plan:  . Patient will check blood sugars with glucometer.  Patient will check blood pressure.  Patient will continue behavioral health therapy . RNCM will follow up with patient within 30 days.

## 2020-12-28 NOTE — Progress Notes (Signed)
Received notification from [name] [employer] Tina [name]) regarding approval for INVOKAMET 150-1000MG . Patient assistance approved from 12/26/2020 to 12/26/2021. Patient will receive a card in the mail to take to a pharmacy of their choice and pickup medication.  Phone: [phone]

## 2020-12-30 NOTE — Progress Notes (Signed)
Thank you :)

## 2021-01-02 ENCOUNTER — Telehealth: Payer: Self-pay

## 2021-01-02 NOTE — Telephone Encounter (Signed)
Spoke to Tina [name] to let her know her Ozempic was delivered here to the Internal Medicine clinic from [employer], and it is ready for pickup. Theres 5 boxes in the fridge.   She said she couldn't come in, so she would be sending her niece.  For any questions contact me: [phone]

## 2021-01-03 ENCOUNTER — Other Ambulatory Visit: Payer: Self-pay

## 2021-01-03 NOTE — Patient Outreach (Signed)
Care Coordination  01/03/2021  [place] May 30, 1971 [id number]   Medicaid Managed Care   Unsuccessful Outreach Note  01/03/2021 Name: Tina [name] MRN: [id number] DOB: [birth year]  Referred by: [name], MD Reason for referral : High Risk Managed Medicaid (Telephone Outreach Unsuccessful)   An unsuccessful telephone outreach was attempted today. The patient was referred to the case management team for assistance with care management and care coordination.   Follow Up Plan: The patient has been provided with contact information for the care management team and has been advised to call with any health related questions or concerns.   Mickel Fuchs, BSW, [place]  High Risk Managed Medicaid Team

## 2021-01-03 NOTE — Patient Instructions (Signed)
Visit Information  Ms. [employer]  - as a part of your Medicaid benefit, you are eligible for care management and care coordination services at no cost or copay. I was unable to reach you by phone today but would be happy to help you with your health related needs. Please feel free to call me @ [phone].     Mickel Fuchs, BSW, [place]  High Risk Managed Medicaid Team

## 2021-01-07 ENCOUNTER — Other Ambulatory Visit: Payer: Self-pay | Admitting: Podiatry

## 2021-01-07 ENCOUNTER — Other Ambulatory Visit: Payer: Self-pay

## 2021-01-07 ENCOUNTER — Ambulatory Visit: Payer: MEDICAID | Admitting: Podiatry

## 2021-01-07 ENCOUNTER — Ambulatory Visit (INDEPENDENT_AMBULATORY_CARE_PROVIDER_SITE_OTHER): Payer: Medicaid Other | Admitting: Podiatry

## 2021-01-07 DIAGNOSIS — G629 Polyneuropathy, unspecified: Secondary | ICD-10-CM | POA: Diagnosis not present

## 2021-01-07 DIAGNOSIS — M216X1 Other acquired deformities of right foot: Secondary | ICD-10-CM | POA: Diagnosis not present

## 2021-01-07 DIAGNOSIS — E1149 Type 2 diabetes mellitus with other diabetic neurological complication: Secondary | ICD-10-CM | POA: Diagnosis not present

## 2021-01-07 DIAGNOSIS — M19071 Primary osteoarthritis, right ankle and foot: Secondary | ICD-10-CM | POA: Diagnosis not present

## 2021-01-07 MED ORDER — IBUPROFEN 800 MG PO TABS: 800.0000 mg | ORAL_TABLET | Freq: Three times a day (TID) | ORAL | 0 refills | Status: DC | PRN

## 2021-01-07 MED FILL — IBUPROFEN 800 MG TABLET: 800 | 10 days supply | Qty: 30 | Fill #0

## 2021-01-07 MED FILL — lamoTRIgine 25 MG TABS: 25 | 30 days supply | Qty: 60 | Fill #0

## 2021-01-07 NOTE — Progress Notes (Signed)
Subjective: 50 year old female presents the office today for evaluation of chronic right ankle pain as well as her painful calluses to both feet.  She has no new concerns today. Her ankle is sore today but no injury. She thinks it is because of the weather.   Objective: AAO x3, NAD DP/PT pulses palpable bilaterally, CRT less than 3 seconds Hyperkeratotic tissue present left medial second toe and first interspace right foot.  No ulceration identified.  There is no erythema.  Chronic edema present although mild to the ankle joint, lateral aspect.  Mild diffuse tenderness of the anterior/lateral ankle joint.  No other areas of discomfort.  MMT 5/5.  Flatfoot is present bilaterally.  Significant bunion is present. No pain with calf compression, swelling, warmth, erythema.    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 -Debrided hyperkeratotic lesions x2 without any complications or bleeding. -Continue [place] brace/ankle brace for no.  Offered steroid injection but she declined. Ibuprofen 800mg  TID prn prescribed.  -Continue current medications including gabapentin and ibuprofen.  Return in about 4 weeks , or as needed  [name] DPM

## 2021-01-16 ENCOUNTER — Ambulatory Visit (INDEPENDENT_AMBULATORY_CARE_PROVIDER_SITE_OTHER): Payer: Medicaid Other | Admitting: Behavioral Health

## 2021-01-16 ENCOUNTER — Other Ambulatory Visit: Payer: Self-pay

## 2021-01-16 DIAGNOSIS — F3181 Bipolar II disorder: Secondary | ICD-10-CM

## 2021-01-20 NOTE — Progress Notes (Signed)
Jenell Dobransky is a 50 y.o. female client who was present today for a scheduled virtual therapy session with this Probation officer. This Probation officer contacted clt to conduct therapy session, clt was very tearful as she stated that her mother "passed away on 02-14-23 (02-02-21)". Clt shared that she had not spoken to many people since her mother's death. She reports she has been taking this time to be with her family as they mourn her mother's passing. Therapist elected to conclude session to allow pt time to grieve. Therapist will follow-up with clt next week.  Virtual Visit via Telephone Note  I connected with Anaily Ashbaugh Germain on 01/20/21 at  2:00 PM EDT by telephone and verified that I am speaking with the correct person using two identifiers.  Location: Patient: [place], [place] Provider: Clinical Home - [facility]   I discussed the limitations, risks, security and privacy concerns of performing an evaluation and management service by telephone and the availability of in person appointments. I also discussed with the patient that there may be a patient responsible charge related to this service. The patient expressed understanding and agreed to proceed.  I discussed the assessment and treatment plan with the patient. The patient was provided an opportunity to ask questions and all were answered. The patient agreed with the plan and demonstrated an understanding of the instructions.   The patient was advised to call back or seek an in-person evaluation if the symptoms worsen or if the condition fails to improve as anticipated.  I provided 10 minutes of non-face-to-face time during this encounter.   Anaisa Radi S Dekari Bures, Counselor         Allyne Gee, Counselor

## 2021-01-21 ENCOUNTER — Telehealth (HOSPITAL_COMMUNITY): Payer: Self-pay | Admitting: *Deleted

## 2021-01-21 NOTE — Telephone Encounter (Signed)
vm left for writer wanting to inform her provider, Eulis Canner DMP she is not benefitting from Vistaril and Buspar, she needs something else "prn" for her high anxiety. States her mom died recently and she is not doing well. Will forward her concern to provider.

## 2021-01-22 ENCOUNTER — Other Ambulatory Visit: Payer: Self-pay | Admitting: Psychiatry

## 2021-01-22 ENCOUNTER — Other Ambulatory Visit (HOSPITAL_COMMUNITY): Payer: Self-pay | Admitting: Psychiatry

## 2021-01-22 DIAGNOSIS — F3181 Bipolar II disorder: Secondary | ICD-10-CM

## 2021-01-22 DIAGNOSIS — F411 Generalized anxiety disorder: Secondary | ICD-10-CM

## 2021-01-22 DIAGNOSIS — F333 Major depressive disorder, recurrent, severe with psychotic symptoms: Secondary | ICD-10-CM

## 2021-01-22 MED ORDER — HYDROXYZINE HCL 50 MG PO TABS: 50.0000 mg | ORAL_TABLET | Freq: Three times a day (TID) | ORAL | 2 refills | Status: DC

## 2021-01-22 MED ORDER — BUSPIRONE HCL 15 MG PO TABS
15.0000 mg | ORAL_TABLET | Freq: Three times a day (TID) | ORAL | 2 refills | Status: DC
Start: 2021-01-22 — End: 2021-03-18

## 2021-01-22 MED ORDER — TRAZODONE HCL 300 MG PO TABS: 300.0000 mg | ORAL_TABLET | Freq: Every evening | ORAL | 2 refills | Status: DC | PRN

## 2021-01-22 NOTE — Telephone Encounter (Signed)
Patient notes that she has been experiencing increased depression, anxiety, and insomnia.  She notes that these symptoms worsened since her mother passed away.  She informed provider that she missed her therapy appointment and notes that she is having a hard time coping.  Today she is agreeable to increasing trazodone 200 mg to 300 mg nightly to help manage sleep.  She is also agreeable to increasing BuSpar 10 mg 3 times daily to 15 mg 3 times daily to help manage anxiety and depression.  She is agreeable to increasing hydroxyzine 25 mg 3 times daily to 50 mg 3 times daily.  Patient notes that she received Valium from a friend and notes that it was effective in managing her anxiety for moment.  Provider informed patient that Valium would not be prescribed at this time.  She endorsed understanding and agreed.  Provider encouraged patient to grieve for her mother and follow-up with outpatient counseling for therapy.  No other concerns noted at this time.

## 2021-01-23 MED FILL — traZODone HCL 300 MG TABS: 300 | 30 days supply | Qty: 30 | Fill #0

## 2021-01-23 MED FILL — hydrOXYzine HCL 50 MG TABS: 50 | 10 days supply | Qty: 30 | Fill #0

## 2021-01-24 ENCOUNTER — Telehealth: Payer: Self-pay

## 2021-01-24 ENCOUNTER — Other Ambulatory Visit: Payer: Self-pay

## 2021-01-24 ENCOUNTER — Other Ambulatory Visit: Payer: Self-pay | Admitting: Obstetrics and Gynecology

## 2021-01-24 MED FILL — busPIRone HCL 15 MG TABS: 15 | 30 days supply | Qty: 90 | Fill #0

## 2021-01-24 NOTE — Telephone Encounter (Signed)
RTC, pt states she went to pick up her Invokamet RX and was told it was discontinued and she needs to call MD.  Pt states she went to pick her RX up at [place].  Per chart review, medication was not discontinued, but was sent to [facility] - outpatient pharmacy.  Pt has been approved for patient assistance and states she received her card in the mail.  Per office not from pharm tech, she can take her card to pharmacy of choice for fill. Pt notified of this and states she wants to use [facility].  RN instructed pt to call pharmacy and have RX transferred, she verbalized understanding. SChaplin, RN,BSN

## 2021-01-24 NOTE — Telephone Encounter (Signed)
Thank you for looking into that. Just to clarify, I do not need to resend the script since it is within the same system?

## 2021-01-24 NOTE — Patient Instructions (Signed)
Hi Tina [name], thank you for speaking with me today.  Tina [name] was given information about Medicaid Managed Care team care coordination services and verbally consented to engagement with the [facility] Managed Care team.   Patient verbalizes understanding of instructions provided today.   The Managed Medicaid care management team will reach out to the patient again over the next 30 days.  The patient has been provided with contact information for the Managed Medicaid care management team and has been advised to call with any health related questions or concerns.    Aida Raider RN, BSN [place]  Triad Curator - Managed Medicaid High Risk [phone].  Following is a copy of your plan of care:  Patient Care Plan: Medication Management    Problem Identified: Health Promotion or Disease Self-Management (General Plan of Care)   Priority: High  Onset Date: 12/27/2020  Note:   Medicaid Managed Care (see longitudinal plan of care for additional care plan information)  Current Barriers:  . Chronic Disease Management support and education needs related to DM, HTN, ankle pain, anxiety/depression. . 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 Update 01/24/21:  patient states she has Medicaid now.  Nurse Case Manager Clinical Goal(s):  Marland Kitchen Over the next 30 days, patient will demonstrate improved adherence to prescribed treatment plan for DM as evidenced by checking blood sugars with glucometer and continuing medications as prescribed.  Update:  Patient states she is not checking her blood sugars-cannot fine glucometer.  Encouraged patient to follow up with her provider and/or purchase new one at [facility].  Patient states she can do this. Marland Kitchen Update 10/11/20:  Patient states she will purchase glucometer next week. Marland Kitchen Update 12/27/20:  Patient states she is not checking her blood sugars.  Patient's Hgb  A1C=7.1%  Interventions:  . Inter-disciplinary care team collaboration (see longitudinal plan of care) . Advised patient to check blood sugars as recommended by provider. . Provided education to patient re: importance of checking blood sugars. . Discussed plans with patient for ongoing care management follow up and provided patient with direct contact information for care management team . Patient will check blood pressure as directed. Marland Kitchen Update 12/27/20:  Patient to follow up with provider regarding ankle pain. Marland Kitchen Update 01/24/21:  Patient seen and evaluated for ankle pain, currently wearing brace and taking Ibuprofen.  Plan:  . Patient will check blood sugars with glucometer.  Patient will check blood pressure.  Patient will continue behavioral health therapy. Update 01/24/21:  Patient's Mother recently died.  Patient has appointment with therapist 01/29/21. Marland Kitchen RNCM will follow up with patient within 30 days.

## 2021-01-24 NOTE — Telephone Encounter (Signed)
Pt is requesting a call back. 

## 2021-01-24 NOTE — Patient Outreach (Signed)
Medicaid Managed Care   Nurse Care Manager Note  01/24/2021 Name:  Tina [name] MRN:  [id number] DOB:  [birth year]  Tina [name] Emami is an 50 y.o. year old female who is a primary patient of Tina Rob, MD.  The [facility] Managed Care Coordination team was consulted for assistance with:    chronic healthcare management needs.  [name] was given information about Medicaid Managed Care Coordination team services today. Tina [name] Tina [name] agreed to services and verbal consent obtained.  Engaged with patient by telephone for follow up visit in response to provider referral for case management and/or care coordination services.   Assessments/Interventions:  Review of past medical history, allergies, medications, health status, including review of consultants reports, laboratory and other test data, was performed as part of comprehensive evaluation and provision of chronic care management services.  SDOH (Social Determinants of Health) assessments and interventions performed:   Care Plan  Allergies  Allergen Reactions  . Codeine Other (See Comments)    hallucinations    Medications Reviewed Today    Reviewed by Gayla Medicus, RN (Registered Nurse) on 01/24/21 at 1444  Med List Status: <None>  Medication Order Taking? Sig Documenting Provider Last Dose Status Informant  ARIPiprazole (ABILIFY) 5 MG tablet [id number]  Take 1 tablet (5 mg total) by mouth daily. Salley Slaughter, NP  Active   atorvastatin (LIPITOR) 40 MG tablet [id number]  Take 1 tablet (40 mg total) by mouth daily. Sid Falcon, MD  Active   Blood Glucose Monitoring Suppl (CONTOUR NEXT ONE) KIT [id number] No 1 each by Does not apply route See admin instructions. USE TO CHECK BLOOD GLUCOSE ONCE DAILY. IM PROGRAM. [name], MD Taking Active   busPIRone (BUSPAR) 15 MG tablet [id number]  Take 1 tablet (15 mg total) by mouth 3 (three) times daily. Salley Slaughter, NP   Active   Canagliflozin-metFORMIN HCl (INVOKAMET) [phone] MG TABS [id number]  Take 1 tablet by mouth 2 (two) times daily. Tina Rob, MD  Active   enalapril (VASOTEC) 20 MG tablet [id number] No TAKE 1 TABLET (20 MG TOTAL) BY MOUTH DAILY. Andrew Au, MD Taking Active   gabapentin (NEURONTIN) 300 MG capsule [id number]  Take 1 capsule (300 mg total) by mouth 3 (three) times daily for 3 days, THEN 2 capsules (600 mg total) 3 (three) times daily. Eulis Canner E, NP  Active   glucose blood (CONTOUR NEXT TEST) test strip [id number] No Please use to check your sugar 3 times a day. Welford Roche, MD Taking Active   hydrOXYzine (ATARAX/VISTARIL) 50 MG tablet [id number]  Take 1 tablet (50 mg total) by mouth 3 (three) times daily. Salley Slaughter, NP  Active   ibuprofen (ADVIL) 800 MG tablet [id number] No Take 1 tablet (800 mg total) by mouth every 8 (eight) hours as needed. [name], MD Taking Active   ibuprofen (ADVIL) 800 MG tablet [id number]  Take 1 tablet (800 mg total) by mouth every 8 (eight) hours as needed. [name], DPM  Active   insulin aspart (NOVOLOG FLEXPEN) 100 UNIT/ML FlexPen [id number]  Inject 10 Units into the skin 3 (three) times daily with meals. INJECT 10 UNITS INTO THE SKIN 3 TIMES DAILY WITH MEALS. [name], MD  Active   insulin glargine (LANTUS SOLOSTAR) 100 UNIT/ML Solostar Pen [id number]  Inject 35 Units into the skin daily at 10 pm. Modena Nunnery D, DO  Active   Insulin Pen Needle (UNIFINE PENTIPS) 31G X 5 MM MISC [id number]  No Inject 1 Units into the skin 3 (three) times daily. Use to inject insulin 4 times daily. IM PROGRAM Christian, [name], MD Taking Active   lamoTRIgine (LAMICTAL) 25 MG tablet [id number]  Take 2 tablets (50 mg total) by mouth daily. Salley Slaughter, NP  Active   Lancets MISC [id number] No 1 Units by Does not apply route 3 (three) times daily. Welford Roche, MD Taking Active   Multiple Vitamins-Minerals (MULTIVITAMIN  WITH MINERALS) tablet [id number] No Take 1 tablet by mouth daily. [provider] Taking Active   Semaglutide,0.25 or 0.5MG/DOS, (OZEMPIC, 0.25 OR 0.5 MG/DOSE,) 2 MG/1.5ML SOPN [id number]  Inject 0.25 mg as directed once a week. Tina Rob, MD  Active   Semaglutide,0.25 or 0.5MG/DOS, (OZEMPIC, 0.25 OR 0.5 MG/DOSE,) 2 MG/1.5ML SOPN [id number]  Inject 0.5 mg into the skin once a week. After one month of .25 mg dose, start injecting .43m dose into the skin once a week SMadalyn Rob MD  Active   sertraline (ZOLOFT) 50 MG tablet [id number] Take 1 tablet (50 mg total) by mouth daily. PSalley Slaughter NP  Active   traZODone (DESYREL) 300 MG tablet [id number] Take 1 tablet (300 mg total) by mouth at bedtime as needed for sleep. PSalley Slaughter NP  Active           Patient Active Problem List   Diagnosis Date Noted  . Generalized anxiety disorder 11/12/2020  . Severe recurrent major depression with psychotic features ([place] 10/04/2020  . Bipolar 2 disorder, major depressive episode ([place] 10/04/2020  . Pain in both feet 10/10/2019  . Fibroids 04/14/2019  . Financial difficulties 04/13/2018  . Seasonal allergies 02/06/2017  . Osteoarthritis of right ankle 08/29/2016  . Tobacco abuse 01/21/2016  . Severe obesity (BMI 35.0-39.9) 10/12/2015  . Foot ulcer ([place] 09/13/2015  . Ovarian cyst 12/24/2012  . Right knee pain 12/09/2012  . Hypertension 07/20/2012  . Healthcare maintenance 06/27/2011  . Uncontrolled type 2 diabetes mellitus ([place] 05/30/2010  . Dyslipidemia 05/30/2010  . Depression 05/30/2010  . 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    Conditions to be addressed/monitored per PCP order:  chronic healthcare management needs-DM, HTN, depression, ankle pain  Care Plan : Medication Management  Updates made by CGayla Medicus RN since 01/24/2021 12:00 AM    Problem: Health Promotion or Disease Self-Management (General Plan of Care)   Priority: High  Onset Date:  12/27/2020  Note:   Medicaid Managed Care (see longitudinal plan of care for additional care plan information)  Current Barriers:  . Chronic Disease Management support and education needs related to DM, HTN, ankle pain, anxiety/depression. . 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 Update 01/24/21:  patient states she has Medicaid now.  Nurse Case Manager Clinical Goal(s):  .Marland KitchenOver the next 30 days, patient will demonstrate improved adherence to prescribed treatment plan for DM as evidenced by checking blood sugars with glucometer and continuing medications as prescribed.  Update:  Patient states she is not checking her blood sugars-cannot fine glucometer.  Encouraged patient to follow up with her provider and/or [facility]  Patient states she can do this. .Marland KitchenUpdate 10/11/20:  Patient states she will purchase glucometer next week. .Marland KitchenUpdate 12/27/20:  Patient states she is not checking her blood sugars.  Patient's Hgb A1C=7.1%  Interventions:  . Inter-disciplinary care team collaboration (see longitudinal plan of care) . Advised patient to check blood sugars as recommended  by provider. . Provided education to patient re: importance of checking blood sugars. . Discussed plans with patient for ongoing care management follow up and provided patient with direct contact information for care management team . Patient will check blood pressure as directed. Marland Kitchen Update 12/27/20:  Patient to follow up with provider regarding ankle pain. Marland Kitchen Update 01/24/21:  Patient seen and evaluated for ankle pain, currently wearing brace and taking Ibuprofen.  Plan:  . Patient will check blood sugars with glucometer.  Patient will check blood pressure.  Patient will continue behavioral health therapy. Update 01/24/21:  Patient's Mother recently died.  Patient has appointment with therapist 01/29/21. Marland Kitchen RNCM will follow up with patient within 30 days.     Follow Up:  Patient agrees to Care Plan and Follow-up.  Plan: The Managed Medicaid care management team will reach out to the patient again over the next 30 days. and The patient has been provided with contact information for the Managed Medicaid care management team and has been advised to call with any health related questions or concerns.  Date/time of next scheduled RN care management/care coordination outreach:  02/18/21 at 0900.

## 2021-01-25 ENCOUNTER — Encounter: Payer: Self-pay | Admitting: Internal Medicine

## 2021-01-25 ENCOUNTER — Other Ambulatory Visit (HOSPITAL_COMMUNITY): Payer: Self-pay | Admitting: Internal Medicine

## 2021-01-25 ENCOUNTER — Other Ambulatory Visit: Payer: Self-pay | Admitting: Internal Medicine

## 2021-01-25 DIAGNOSIS — E1165 Type 2 diabetes mellitus with hyperglycemia: Secondary | ICD-10-CM

## 2021-01-25 MED ORDER — INVOKAMET 150-1000 MG PO TABS: 1.0000 | ORAL_TABLET | Freq: Two times a day (BID) | ORAL | 6 refills | Status: DC

## 2021-01-25 NOTE — Telephone Encounter (Signed)
She should be able to transfer the RX herself since it's not controlled. I told her to call back if she had a problem transferring.   Thanks! Ginia Rudell

## 2021-01-28 ENCOUNTER — Telehealth: Payer: Self-pay | Admitting: *Deleted

## 2021-01-28 NOTE — Telephone Encounter (Addendum)
Information was sent through [employer] for PA for Invokamet 150mg -1,000 mg and Jardiance.  Awaiting determination.  Sander Nephew, RN 01/28/2021 11:48 AM. PA for Tina [name] was approved.  01/28/2021 thru 01/28/2022.  Invokamet was denied patient will need to try and fail  [name].  Message to be sent to the [facility] Team to consider a change.  Sander Nephew, RN 01/28/2021 4:32 PM.

## 2021-01-29 ENCOUNTER — Other Ambulatory Visit: Payer: Self-pay

## 2021-01-29 ENCOUNTER — Ambulatory Visit (HOSPITAL_COMMUNITY): Payer: Medicaid Other | Admitting: Behavioral Health

## 2021-01-29 MED FILL — ENALAPRIL MALEATE 20 MG TAB: 20 | 30 days supply | Qty: 30 | Fill #1

## 2021-01-31 ENCOUNTER — Other Ambulatory Visit (HOSPITAL_COMMUNITY): Payer: Self-pay | Admitting: Internal Medicine

## 2021-01-31 ENCOUNTER — Telehealth: Payer: Self-pay

## 2021-01-31 DIAGNOSIS — E1165 Type 2 diabetes mellitus with hyperglycemia: Secondary | ICD-10-CM

## 2021-01-31 MED ORDER — EMPAGLIFLOZIN 10 MG PO TABS: 10.0000 mg | ORAL_TABLET | Freq: Every day | ORAL | 2 refills | Status: DC

## 2021-01-31 MED ORDER — METFORMIN HCL 1000 MG PO TABS: 1000.0000 mg | ORAL_TABLET | Freq: Two times a day (BID) | ORAL | 2 refills | Status: DC

## 2021-01-31 MED FILL — JARDIANCE 10 MG TABLET: 10 | 30 days supply | Qty: 30 | Fill #0

## 2021-01-31 MED FILL — METFORMIN HCL 1000 MG TABS: 1000 | 30 days supply | Qty: 60 | Fill #0

## 2021-01-31 NOTE — Telephone Encounter (Signed)
Pt having difficulty obtaining invokamet-call transferred to PA staff for further assistance.Despina Hidden Cassady3/31/202210:53 AM

## 2021-01-31 NOTE — Telephone Encounter (Signed)
Patient was called and will go to pharmacy today to pick up.

## 2021-01-31 NOTE — Addendum Note (Signed)
Addended by: Hughes Better on: 01/31/2021 01:08 PM   Modules accepted: Orders

## 2021-01-31 NOTE — Progress Notes (Unsigned)
   THERAPIST PROGRESS NOTE  Session Time: ***  Participation Level: {BHH PARTICIPATION LEVEL:22264}  Behavioral Response: {Appearance:22683}{BHH LEVEL OF CONSCIOUSNESS:22305}{BHH MOOD:22306}  Type of Therapy: {CHL AMB BH Type of Therapy:21022741}  Treatment Goals addressed: {CHL AMB BH Treatment Goals Addressed:21022754}  Interventions: {CHL AMB BH Type of Intervention:21022753}  Summary: Tina [name] is a 50 y.o. female who presents with ***.   Suicidal/Homicidal: {BHH YES OR NO:22294}{yes/no/with/without intent/plan:22693}  Therapist Response: ***  Plan: Return again in *** weeks.  Diagnosis: Axis I: {psych axis 1:31909}    Axis II: {psych axis 2:31910}    Allyne Gee, Counselor 01/31/2021

## 2021-01-31 NOTE — Telephone Encounter (Cosign Needed Addendum)
Patient was previously receiving Invokamet (canagliflozin-metformin) 150-1000mg  BID via patient assistance, but since obtaining Medicaid coverage she no longer qualifies to receive her prescriptions via patient assistance. Spoke with Sander Nephew, RN and Medicaid requires trial and failure of Jardiance or Farxiga to cover [place].   Will send two prescriptions: Metformin 1000mg  BID Jardiance 10mg  once daily.  Regino Schultze will make patient aware.

## 2021-01-31 NOTE — Telephone Encounter (Signed)
Pt contact pt [phone]

## 2021-02-01 ENCOUNTER — Telehealth: Payer: Self-pay | Admitting: Pharmacist

## 2021-02-01 NOTE — Telephone Encounter (Cosign Needed)
Called and clarified medications patient is currently taking for diabetes management.  Current regimen: Ozempic 0.25mg  once weekly Janumet 50-1000mg  BID  Patient reports she was instructed to stop her insulin. Patient was supposed to be switched to [facility] and patient assistance was completed for this, but patient reports she never received this medication.   Yesterday a prescription for Jardiance 10mg  and metformin 1000mg  BID was sent to pharmacy as she recently received Medicaid coverage and will now no longer be receiving patient assistance medications. Patient picked up prescriptions and reports taking the Jardiance yesterday and today, but did not take the metformin as she states "I knew it was already in my Janumet."  Discussed with patient that she should stop taking the Janumet, as this is not recommended to take alongside Ozempic, and begin taking the Jardiance 10mg  and metformin 1000mg  BID. The Jardiance and metformin are replacing her Invokament (invokana-metformin) that patient never received. Patient was counseled on desired effects and most common side effects. Patient verbalized proper understanding of plan.  Patient states she is not currently checking her blood glucose.  Denies increased thirst, urination, hunger, and changes in vision.   Instructed patient to check her blood glucose every morning when she wakes up (fasting) and then two hours after a meal or at least one time during the day.  Follow-up on Tuesday to discuss blood glucose readings.

## 2021-02-01 NOTE — Telephone Encounter (Signed)
RTC, pt states she is still taking Janumet and is confused about what she needs to take. RN contacted Dr. Georgina Peer and Dr. Georgina Peer agreed to reach out to patient this morning for DM med management and update med list as needed. Thank you! Jullian Previti

## 2021-02-01 NOTE — Telephone Encounter (Signed)
Pls contact pt regarding medicine [phone]

## 2021-02-02 ENCOUNTER — Other Ambulatory Visit: Payer: Self-pay

## 2021-02-02 MED FILL — Hydroxyzine HCl Tab 25 MG: ORAL | 30 days supply | Qty: 90 | Fill #0 | Status: CN

## 2021-02-02 MED FILL — Sertraline HCl Tab 100 MG: ORAL | 30 days supply | Qty: 30 | Fill #0 | Status: CN

## 2021-02-02 MED FILL — Lamotrigine Tab 25 MG: ORAL | 30 days supply | Qty: 60 | Fill #0 | Status: CN

## 2021-02-03 ENCOUNTER — Other Ambulatory Visit: Payer: Self-pay

## 2021-02-11 ENCOUNTER — Other Ambulatory Visit: Payer: Self-pay

## 2021-02-12 ENCOUNTER — Encounter: Payer: Medicaid Other | Admitting: Dietician

## 2021-02-14 ENCOUNTER — Other Ambulatory Visit: Payer: Self-pay

## 2021-02-18 ENCOUNTER — Other Ambulatory Visit: Payer: Self-pay | Admitting: Obstetrics and Gynecology

## 2021-02-18 ENCOUNTER — Other Ambulatory Visit: Payer: Self-pay

## 2021-02-18 NOTE — Patient Instructions (Signed)
Visit Information  Tina [name] was given information about Medicaid Managed Care team care coordination services and verbally consented to engagement with the [facility] Managed Care team.    Tina [name] - following are the goals we discussed in your visit today:  @GOALSADDRESSED   Patient verbalizes understanding of instructions provided today.   The Managed Medicaid care management team will reach out to the patient again over the next 30 days.  The patient has been provided with contact information for the Managed Medicaid care management team and has been advised to call with any health related questions or concerns.   Aida Raider RN, BSN [place]  [facility] - Managed Medicaid High Risk [phone].  Following is a copy of your plan of care:      Problem Identified: Health Promotion or Disease Self-Management (General Plan of Care)   Priority: High  Onset Date: 12/27/2020  Note:   Medicaid Managed Care (see longitudinal plan of care for additional care plan information)  Current Barriers:  . Chronic Disease Management support and education needs related to DM, HTN, ankle pain, anxiety/depression, smoking. . 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 Update 01/24/21:  patient states she has Medicaid now.  Nurse Case Manager Clinical Goal(s):  Marland Kitchen Over the next 30 days, patient will demonstrate improved adherence to prescribed treatment plan for DM as evidenced by checking blood sugars with glucometer and continuing medications as prescribed.  Update:  Patient states she is not checking her blood sugars-cannot fine glucometer.  Encouraged patient to follow up with her provider and/or purchase new one at [facility].  Patient states she can do this. Marland Kitchen Update 10/11/20:  Patient states she will purchase glucometer next week. Marland Kitchen Update 12/27/20:  Patient states she is not checking her blood  sugars.  Patient's Hgb A1C=7.1% . Update 02/18/21:  Patient checking blood sugars twice a day. . Over the next 30 days, patient will consider smoking cessation techniques.  Interventions:  . Inter-disciplinary care team collaboration (see longitudinal plan of care) . Advised patient to check blood sugars as recommended by provider. Marland Kitchen Update 02/18/21:  Patient checking blood sugars twice a day. . Provided education to patient re: importance of checking blood sugars. . Discussed plans with patient for ongoing care management follow up and provided patient with direct contact information for care management team . Patient will check blood pressure as directed. . Provided patient with 1-800-QUIT Now phone number.  Will contact patient's PCP regarding possible prescription.  Patient does not want to use patches or Chantix. Marland Kitchen Update 02/18/21:  Patient not currently checking blood sugars. Marland Kitchen Update 12/27/20:  Patient to follow up with provider regarding ankle pain. Marland Kitchen Update 01/24/21:  Patient seen and evaluated for ankle pain, currently wearing brace and taking Ibuprofen. Marland Kitchen Update 02/18/21:  Patient continues to wear ankle brace-has follow up appointment with provider 02/19/21.  Plan:  . Patient will check blood sugars with glucometer.  Patient will check blood pressure.   . Patient will continue behavioral health therapy. Update 01/24/21:  Patient's Mother recently died.  Patient has appointment with therapist 01/29/21. Update 02/18/21:  Patient continues therapy services. Marland Kitchen RNCM will follow up with patient within 30 days.

## 2021-02-18 NOTE — Patient Outreach (Signed)
Medicaid Managed Care   Nurse Care Manager Note  02/18/2021 Name:  Flornce Record MRN:  [id number] DOB:  01-03-1971  Tina Marble Stigall is an 50 y.o. year old female who is a primary patient of [name], MD.  The [facility] Managed Care Coordination team was consulted for assistance with:    chronic healthcare management needs.  Ms. Saks was given information about Medicaid Managed Care Coordination team services today. Tina [name] agreed to services and verbal consent obtained.  Engaged with patient by telephone for follow up visit in response to provider referral for case management and/or care coordination services.   Assessments/Interventions:  Review of past medical history, allergies, medications, health status, including review of consultants reports, laboratory and other test data, was performed as part of comprehensive evaluation and provision of chronic care management services.  SDOH (Social Determinants of Health) assessments and interventions performed:   Care Plan  Allergies  Allergen Reactions  . Codeine Other (See Comments)    hallucinations    Medications Reviewed Today    Reviewed by Gayla Medicus, RN (Registered Nurse) on 02/18/21 at [phone]  Med List Status: <None>  Medication Order Taking? Sig Documenting Provider Last Dose Status Informant  ARIPiprazole (ABILIFY) 5 MG tablet [id number] No Take 1 tablet (5 mg total) by mouth daily. Salley Slaughter, NP Taking Active   atorvastatin (LIPITOR) 20 MG tablet [id number]  TAKE 2 TABLETS BY MOUTH ONCE A DAY Sid Falcon, MD  Active   atorvastatin (LIPITOR) 40 MG tablet [id number] No Take 1 tablet (40 mg total) by mouth daily. Sid Falcon, MD Taking Active   Blood Glucose Monitoring Suppl (CONTOUR NEXT ONE) KIT [id number] No 1 each by Does not apply route See admin instructions. USE TO CHECK BLOOD GLUCOSE ONCE DAILY. IM PROGRAM. [name], MD Taking Active    busPIRone (BUSPAR) 10 MG tablet [id number]  TAKE 1 TABLET (10 MG TOTAL) BY MOUTH 3 (THREE) TIMES DAILY. Salley Slaughter, NP  Active   busPIRone (BUSPAR) 10 MG tablet [id number]  TAKE 1 TABLET (10 MG TOTAL) BY MOUTH 3 (THREE) TIMES DAILY. Salley Slaughter, NP  Active   busPIRone (BUSPAR) 15 MG tablet [id number] No Take 1 tablet (15 mg total) by mouth 3 (three) times daily. Salley Slaughter, NP Taking Active   busPIRone (BUSPAR) 15 MG tablet [id number]  TAKE 1 TABLET (15 MG TOTAL) BY MOUTH 3 (THREE) TIMES DAILY. Salley Slaughter, NP  Active   busPIRone (BUSPAR) 5 MG tablet [id number]  TAKE 2 TABLETS (10 MG TOTAL) BY MOUTH 3 (THREE) TIMES DAILY. Eulis Canner E, NP  Active   Canagliflozin-metFORMIN HCl [id number] MG TABS [id number]  TAKE 1 TABLET BY MOUTH 2 (TWO) TIMES DAILY. [name], MD  Active   empagliflozin (JARDIANCE) 10 MG TABS tablet [id number] No Take 1 tablet (10 mg total) by mouth daily.  Patient not taking: Reported on 02/01/2021   Velna Ochs, MD Not Taking Active   empagliflozin (JARDIANCE) 10 MG TABS tablet [id number]  TAKE 1 TABLET (10 MG TOTAL) BY MOUTH DAILY. Velna Ochs, MD  Active   enalapril (VASOTEC) 20 MG tablet [id number] No TAKE 1 TABLET (20 MG TOTAL) BY MOUTH DAILY. Andrew Au, MD Taking Active   enalapril (VASOTEC) 20 MG tablet [id number]  TAKE 1 TABLET (20 MG TOTAL) BY MOUTH DAILY. Andrew Au, MD  Active   gabapentin (NEURONTIN) 300 MG capsule [id number] No Take 1 capsule (300 mg total) by mouth 3 (  three) times daily for 3 days, THEN 2 capsules (600 mg total) 3 (three) times daily. Salley Slaughter, NP Taking Active   gabapentin (NEURONTIN) 300 MG capsule [id number]  TAKE 1 CAPSULE BY MOUTH 3 TIMES DAILY FOR 3 DAYS, THEN 2 CAPSULES BY MOUTH 3 TIMES DAILY. Salley Slaughter, NP  Active   gabapentin (NEURONTIN) 300 MG capsule [id number]  TAKE 1 CAPSULE BY MOUTH THREE TIMES DAILY FOR 3 DAYS THEN 2 THREE TIMES DAILY. Mitzi Hansen, MD  Active    glucose blood (CONTOUR NEXT TEST) test strip [id number] No Please use to check your sugar 3 times a day. Welford Roche, MD Taking Active   hydrOXYzine (ATARAX/VISTARIL) 25 MG tablet [id number]  TAKE 1 TABLET (25 MG TOTAL) BY MOUTH 3 (THREE) TIMES DAILY. Eulis Canner E, NP  Active   hydrOXYzine (ATARAX/VISTARIL) 25 MG tablet [id number]  TAKE 1 TABLET (25 MG TOTAL) BY MOUTH 3 (THREE) TIMES DAILY. Eulis Canner E, NP  Active   hydrOXYzine (ATARAX/VISTARIL) 25 MG tablet [id number]  TAKE 1 TABLET (25 MG TOTAL) BY MOUTH 3 (THREE) TIMES DAILY. Salley Slaughter, NP  Active   hydrOXYzine (ATARAX/VISTARIL) 50 MG tablet [id number] No Take 1 tablet (50 mg total) by mouth 3 (three) times daily. Salley Slaughter, NP Taking Active   hydrOXYzine (ATARAX/VISTARIL) 50 MG tablet [id number]  TAKE 1 TABLET (50 MG TOTAL) BY MOUTH 3 (THREE) TIMES DAILY. Salley Slaughter, NP  Active   ibuprofen (ADVIL) 800 MG tablet [id number] No Take 1 tablet (800 mg total) by mouth every 8 (eight) hours as needed. [name], DPM Taking Active   ibuprofen (ADVIL) 800 MG tablet [id number]  TAKE 1 TABLET (800 MG TOTAL) BY MOUTH EVERY 8 (EIGHT) HOURS AS NEEDED. [name], DPM  Active   ibuprofen (ADVIL) 800 MG tablet [id number]  TAKE 1 TABLET (800 MG TOTAL) BY MOUTH EVERY 8 (EIGHT) HOURS AS NEEDED. [name], DPM  Active   insulin aspart (NOVOLOG FLEXPEN) 100 UNIT/ML FlexPen [id number] No Inject 10 Units into the skin 3 (three) times daily with meals. INJECT 10 UNITS INTO THE SKIN 3 TIMES DAILY WITH MEALS.  Patient not taking: Reported on 02/01/2021   [name], MD Not Taking Active   insulin aspart (NOVOLOG) 100 UNIT/ML FlexPen [id number]  INJECT 10 UNITS INTO THE SKIN 3 (THREE) TIMES DAILY WITH MEALS. [name], MD  Active   insulin glargine (LANTUS SOLOSTAR) 100 UNIT/ML Solostar Pen [id number] No Inject 35 Units into the skin daily at 10 pm.  Patient not taking: Reported on 02/01/2021    Modena Nunnery D, DO Not Taking Active   Insulin Pen Needle (UNIFINE PENTIPS) 31G X 5 MM MISC [id number] No Inject 1 Units into the skin 3 (three) times daily. Use to inject insulin 4 times daily. IM PROGRAM Christian, [name], MD Taking Active   Insulin Pen Needle 31G X 5 MM MISC [id number]  INJECT 1 UNITS INTO THE SKIN 3 (THREE) TIMES DAILY. USE TO INJECT INSULIN 4 TIMES DAILY. IM PROGRAM Christian, [name], MD  Active   lamoTRIgine (LAMICTAL) 25 MG tablet [id number] No Take 2 tablets (50 mg total) by mouth daily. Salley Slaughter, NP Taking Active   lamoTRIgine (LAMICTAL) 25 MG tablet [id number]  TAKE 2 TABLETS (50 MG TOTAL) BY MOUTH DAILY. Salley Slaughter, NP  Active   lamoTRIgine (LAMICTAL) 25 MG tablet [id number]  TAKE 2 TABLETS (50 MG TOTAL) BY MOUTH DAILY. Salley Slaughter, NP  Active  lamoTRIgine (LAMICTAL) 25 MG tablet [id number]  TAKE 1 TABLET (25 MG TOTAL) BY MOUTH DAILY. Salley Slaughter, NP  Active   Lancets MISC [id number] No 1 Units by Does not apply route 3 (three) times daily. Welford Roche, MD Taking Active   metFORMIN (GLUCOPHAGE) 1000 MG tablet [id number] No Take 1 tablet (1,000 mg total) by mouth 2 (two) times daily with a meal. Velna Ochs, MD Not Taking Active   metFORMIN (GLUCOPHAGE) 1000 MG tablet [id number]  TAKE 1 TABLET (1,000 MG TOTAL) BY MOUTH 2 (TWO) TIMES DAILY WITH A MEAL. Velna Ochs, MD  Active   Multiple Vitamins-Minerals (MULTIVITAMIN WITH MINERALS) tablet [id number] No Take 1 tablet by mouth daily. [provider] Taking Active   Semaglutide,0.25 or 0.5MG /DOS, (OZEMPIC, 0.25 OR 0.5 MG/DOSE,) 2 MG/1.5ML SOPN [id number] No Inject 0.25 mg as directed once a week. [name], MD Taking Active   sertraline (ZOLOFT) 100 MG tablet [id number]  TAKE 1 TABLET (100 MG TOTAL) BY MOUTH DAILY. [name], MD  Active   sertraline (ZOLOFT) 50 MG tablet [id number] No Take 1 tablet (50 mg total) by mouth daily. Salley Slaughter, NP Taking Active    traZODone (DESYREL) 300 MG tablet [id number] No Take 1 tablet (300 mg total) by mouth at bedtime as needed for sleep. Salley Slaughter, NP Taking Active   trazodone (DESYREL) 300 MG tablet [id number]  TAKE 1 TABLET (300 MG TOTAL) BY MOUTH AT BEDTIME AS NEEDED FOR SLEEP. Salley Slaughter, NP  Active   traZODone (DESYREL) 50 MG tablet [id number]  TAKE 1 TABLET (50 MG TOTAL) BY MOUTH AT BEDTIME AS NEEDED FOR SLEEP. Salley Slaughter, NP  Active           Patient Active Problem List   Diagnosis Date Noted  . Generalized anxiety disorder 11/12/2020  . Severe recurrent major depression with psychotic features ([place]) 10/04/2020  . Bipolar 2 disorder, major depressive episode ([place]) 10/04/2020  . Pain in both feet 10/10/2019  . Fibroids 04/14/2019  . Financial difficulties 04/13/2018  . Seasonal allergies 02/06/2017  . Osteoarthritis of right ankle 08/29/2016  . Tobacco abuse 01/21/2016  . Severe obesity (BMI 35.0-39.9) 10/12/2015  . Foot ulcer ([place]) 09/13/2015  . Ovarian cyst 12/24/2012  . Right knee pain 12/09/2012  . Hypertension 07/20/2012  . Healthcare maintenance 06/27/2011  . Uncontrolled type 2 diabetes mellitus ([place]) 05/30/2010  . Dyslipidemia 05/30/2010  . Depression 05/30/2010  . 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    Conditions to be addressed/monitored per PCP order:  chronic healthcare management needs, DM, HTN, depression, osteoarthritis.        Problem: Health Promotion or Disease Self-Management (General Plan of Care)   Priority: High  Onset Date: 12/27/2020  Note:   Medicaid Managed Care (see longitudinal plan of care for additional care plan information)  Current Barriers:  . Chronic Disease Management support and education needs related to DM, HTN, ankle pain, anxiety/depression, smoking. . 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 Update 01/24/21:  patient states she has Medicaid  now.  Nurse Case Manager Clinical Goal(s):  Marland Kitchen Over the next 30 days, patient will demonstrate improved adherence to prescribed treatment plan for DM as evidenced by checking blood sugars with glucometer and continuing medications as prescribed.  Update:  Patient states she is not checking her blood sugars-cannot fine glucometer.  Encouraged patient to follow up with her provider and/or purchase new one at [facility].  Patient states  she can do this. Marland Kitchen Update 10/11/20:  Patient states she will purchase glucometer next week. Marland Kitchen Update 12/27/20:  Patient states she is not checking her blood sugars.  Patient's Hgb A1C=7.1% . Update 02/18/21:  Patient checking blood sugars twice a day. . Over the next 30 days, patient will consider smoking cessation techniques.  Interventions:  . Inter-disciplinary care team collaboration (see longitudinal plan of care) . Advised patient to check blood sugars as recommended by provider. Marland Kitchen Update 02/18/21:  Patient checking blood sugars twice a day. . Provided education to patient re: importance of checking blood sugars. . Discussed plans with patient for ongoing care management follow up and provided patient with direct contact information for care management team . Patient will check blood pressure as directed. . Provided patient with 1-800-QUIT Now phone number.  Will contact patient's PCP regarding possible prescription.  Patient does not want to use patches or Chantix. Marland Kitchen Update 02/18/21:  Patient not currently checking blood sugars. Marland Kitchen Update 12/27/20:  Patient to follow up with provider regarding ankle pain. Marland Kitchen Update 01/24/21:  Patient seen and evaluated for ankle pain, currently wearing brace and taking Ibuprofen. Marland Kitchen Update 02/18/21:  Patient continues to wear ankle brace-has follow up appointment with provider 02/19/21.  Plan:  . Patient will check blood sugars with glucometer.  Patient will check blood pressure.   . Patient will continue behavioral health  therapy. Update 01/24/21:  Patient's Mother recently died.  Patient has appointment with therapist 01/29/21. Update 02/18/21:  Patient continues therapy services. Marland Kitchen RNCM will follow up with patient within 30 days.    Follow Up:  Patient agrees to Care Plan and Follow-up.  Plan: The Managed Medicaid care management team will reach out to the patient again over the next 30 days. and The patient has been provided with contact information for the Managed Medicaid care management team and has been advised to call with any health related questions or concerns.  Date/time of next scheduled RN care management/care coordination outreach:  03/20/21 at 0900.

## 2021-02-19 ENCOUNTER — Ambulatory Visit (INDEPENDENT_AMBULATORY_CARE_PROVIDER_SITE_OTHER): Payer: Medicaid Other

## 2021-02-19 ENCOUNTER — Other Ambulatory Visit (HOSPITAL_COMMUNITY): Payer: Self-pay

## 2021-02-19 ENCOUNTER — Other Ambulatory Visit: Payer: Self-pay

## 2021-02-19 ENCOUNTER — Ambulatory Visit (INDEPENDENT_AMBULATORY_CARE_PROVIDER_SITE_OTHER): Payer: Medicaid Other | Admitting: Podiatry

## 2021-02-19 DIAGNOSIS — E1149 Type 2 diabetes mellitus with other diabetic neurological complication: Secondary | ICD-10-CM | POA: Diagnosis not present

## 2021-02-19 DIAGNOSIS — M216X1 Other acquired deformities of right foot: Secondary | ICD-10-CM

## 2021-02-19 DIAGNOSIS — M19071 Primary osteoarthritis, right ankle and foot: Secondary | ICD-10-CM

## 2021-02-19 DIAGNOSIS — G629 Polyneuropathy, unspecified: Secondary | ICD-10-CM | POA: Diagnosis not present

## 2021-02-19 DIAGNOSIS — D492 Neoplasm of unspecified behavior of bone, soft tissue, and skin: Secondary | ICD-10-CM

## 2021-02-19 MED ORDER — DICLOFENAC SODIUM 1 % EX GEL
2.0000 g | Freq: Four times a day (QID) | CUTANEOUS | 2 refills | Status: DC
  Filled 2021-02-19: qty 100, 13d supply, fill #0
  Filled 2021-07-23: qty 100, 13d supply, fill #1

## 2021-02-20 ENCOUNTER — Other Ambulatory Visit (HOSPITAL_COMMUNITY): Payer: Self-pay

## 2021-02-20 MED ORDER — SERTRALINE HCL 50 MG PO TABS
50.0000 mg | ORAL_TABLET | Freq: Every day | ORAL | 8 refills | Status: DC
  Filled 2021-02-20: qty 30, 30d supply, fill #0

## 2021-02-20 MED FILL — Gabapentin Cap 300 MG: ORAL | 30 days supply | Qty: 180 | Fill #0 | Status: AC

## 2021-02-20 MED FILL — Trazodone HCl Tab 300 MG: ORAL | 30 days supply | Qty: 30 | Fill #0 | Status: AC

## 2021-02-20 MED FILL — Hydroxyzine HCl Tab 50 MG: ORAL | 10 days supply | Qty: 30 | Fill #0 | Status: AC

## 2021-02-20 MED FILL — Lamotrigine Tab 25 MG: ORAL | 60 days supply | Qty: 120 | Fill #0 | Status: AC

## 2021-02-20 MED FILL — Buspirone HCl Tab 15 MG: ORAL | 30 days supply | Qty: 90 | Fill #0 | Status: AC

## 2021-02-21 ENCOUNTER — Other Ambulatory Visit: Payer: Self-pay | Admitting: Obstetrics and Gynecology

## 2021-02-21 ENCOUNTER — Other Ambulatory Visit (HOSPITAL_COMMUNITY): Payer: Self-pay

## 2021-02-21 NOTE — Progress Notes (Signed)
Subjective: 50 year old female presents the office today for evaluation of chronic bilateral foot discomfort and calluses.  She is in that she is also getting soreness mostly to the lateral aspect of the right ankle.  She denies any recent injury or falls.  Objective: AAO x3, NAD DP/PT pulses palpable bilaterally, CRT less than 3 seconds Hyperkeratotic tissue present left medial second toe, left fifth MPJ, left submetatarsal 2, and first interspace right foot.  There is no underlying ulceration drainage or any signs of infection noted today. Tenderness to palpation along the anterior lateral aspect of the right ankle as well as the lateral aspect.  There is minimal edema to the ankle.  There is no erythema or warmth.  Mild discomfort ankle joint range of motion on the right side. No pain with calf compression, swelling, warmth, erythema.    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 -X-rays obtained reviewed of the right ankle.  No evidence of acute fracture.  Flatfoot is present with history of previous triple arthrodesis.  Arthritic changes present to the ankle with valgus deformity present. -In regards to the ankle continue with bracing.  She previously had an [place] brace.  She was regular ankle brace which is helpful at times.  She was asked about Voltaren gel which was ordered.  Consider ankle joint fusion now that her A1c has much improved. She is going to think about this.  -Sharply debride the hyperkeratotic lesions x4 without any complications or bleeding.  Continue offloading and moisturizer.  Return in about 4 weeks , or as needed  [name] DPM

## 2021-02-21 NOTE — Patient Outreach (Signed)
Care Coordination  02/21/2021  Tina [name] 03/01/1971 [id number]   RNCM called patient to relay information from provider.  No answer so left message for patient with information.  Aida Raider RN, BSN [place]  [facility] - Managed Medicaid High Risk [phone].

## 2021-03-03 MED FILL — Empagliflozin Tab 10 MG: ORAL | 30 days supply | Qty: 30 | Fill #0 | Status: AC

## 2021-03-03 MED FILL — Enalapril Maleate Tab 20 MG: ORAL | 90 days supply | Qty: 90 | Fill #0 | Status: AC

## 2021-03-04 ENCOUNTER — Other Ambulatory Visit (HOSPITAL_COMMUNITY): Payer: Self-pay

## 2021-03-07 ENCOUNTER — Other Ambulatory Visit: Payer: Self-pay

## 2021-03-07 ENCOUNTER — Ambulatory Visit (HOSPITAL_COMMUNITY): Payer: Medicaid Other | Admitting: Behavioral Health

## 2021-03-07 ENCOUNTER — Other Ambulatory Visit (HOSPITAL_COMMUNITY): Payer: Self-pay

## 2021-03-07 NOTE — Progress Notes (Unsigned)
   THERAPIST PROGRESS NOTE  Virtual Visit via Telephone Note  I connected with Kirstin Kugler Mill on 03/07/21 at  3:00 PM EDT by telephone and verified that I am speaking with the correct person using two identifiers.  Location: Patient: Tina [name] Provider: [place] [facility]   I discussed the limitations, risks, security and privacy concerns of performing an evaluation and management service by telephone and the availability of in person appointments. I also discussed with the patient that there may be a patient responsible charge related to this service. The patient expressed understanding and agreed to proceed.   I discussed the assessment and treatment plan with the patient. The patient was provided an opportunity to ask questions and all were answered. The patient agreed with the plan and demonstrated an understanding of the instructions.   The patient was advised to call back or seek an in-person evaluation if the symptoms worsen or if the condition fails to improve as anticipated.  I provided 45 minutes of non-face-to-face time during this encounter.   Allyne Gee, Counselor   Session Time: 3:00PM  Participation Level: Active  Behavioral Response: NAAlertDepressed  Type of Therapy: Individual Therapy  Treatment Goals addressed: Coping  Interventions: CBT and Supportive  Summary: Taylee Gunnells is a 50 y.o. female who presents with ***.   Suicidal/Homicidal: No  Therapist Response: Therapist offered encouragement and support.  Plan: Return again in 3 weeks.  Diagnosis: Axis I: Bipolar, Depressed    Axis II: No diagnosis    Allyne Gee, Counselor 03/07/2021

## 2021-03-12 ENCOUNTER — Other Ambulatory Visit: Payer: Self-pay

## 2021-03-12 ENCOUNTER — Ambulatory Visit: Payer: Medicaid Other

## 2021-03-12 NOTE — Addendum Note (Signed)
Addended by: Hulan Fray on: 03/12/2021 03:42 PM   Modules accepted: Orders

## 2021-03-18 ENCOUNTER — Other Ambulatory Visit: Payer: Self-pay

## 2021-03-18 ENCOUNTER — Encounter (HOSPITAL_COMMUNITY): Payer: Self-pay | Admitting: Psychiatry

## 2021-03-18 ENCOUNTER — Other Ambulatory Visit (HOSPITAL_COMMUNITY): Payer: Self-pay

## 2021-03-18 ENCOUNTER — Telehealth (INDEPENDENT_AMBULATORY_CARE_PROVIDER_SITE_OTHER): Payer: Medicaid Other | Admitting: Psychiatry

## 2021-03-18 DIAGNOSIS — F99 Mental disorder, not otherwise specified: Secondary | ICD-10-CM | POA: Insufficient documentation

## 2021-03-18 DIAGNOSIS — F5105 Insomnia due to other mental disorder: Secondary | ICD-10-CM | POA: Diagnosis not present

## 2021-03-18 DIAGNOSIS — F3181 Bipolar II disorder: Secondary | ICD-10-CM

## 2021-03-18 DIAGNOSIS — F411 Generalized anxiety disorder: Secondary | ICD-10-CM | POA: Diagnosis not present

## 2021-03-18 MED ORDER — HYDROXYZINE HCL 50 MG PO TABS
50.0000 mg | ORAL_TABLET | Freq: Three times a day (TID) | ORAL | 2 refills | Status: DC
  Filled 2021-03-18: qty 90, 30d supply, fill #0

## 2021-03-18 MED ORDER — TRAZODONE HCL 300 MG PO TABS
300.0000 mg | ORAL_TABLET | Freq: Every evening | ORAL | 2 refills | Status: DC | PRN
Start: 2021-03-18 — End: 2021-07-12
  Filled 2021-03-18: qty 30, 30d supply, fill #0

## 2021-03-18 MED ORDER — BUSPIRONE HCL 15 MG PO TABS
15.0000 mg | ORAL_TABLET | Freq: Three times a day (TID) | ORAL | 2 refills | Status: DC
  Filled 2021-03-18 – 2021-05-13 (×2): qty 90, 30d supply, fill #0

## 2021-03-18 MED ORDER — SERTRALINE HCL 50 MG PO TABS
50.0000 mg | ORAL_TABLET | Freq: Every day | ORAL | 2 refills | Status: DC
  Filled 2021-03-18 – 2021-05-13 (×2): qty 90, 90d supply, fill #0

## 2021-03-18 MED ORDER — ZOLPIDEM TARTRATE 5 MG PO TABS
5.0000 mg | ORAL_TABLET | Freq: Every evening | ORAL | 2 refills | Status: DC | PRN
  Filled 2021-03-18: qty 15, 15d supply, fill #0

## 2021-03-18 MED ORDER — ARIPIPRAZOLE 5 MG PO TABS
5.0000 mg | ORAL_TABLET | Freq: Every day | ORAL | 2 refills | Status: DC
  Filled 2021-03-18 (×2): qty 30, 30d supply, fill #0

## 2021-03-18 MED ORDER — LAMOTRIGINE 100 MG PO TABS
100.0000 mg | ORAL_TABLET | Freq: Every day | ORAL | 2 refills | Status: DC
  Filled 2021-03-18 – 2021-07-18 (×2): qty 30, 30d supply, fill #0
  Filled 2021-09-06: qty 30, 30d supply, fill #1

## 2021-03-18 NOTE — Progress Notes (Signed)
[place] MD/PA/NP OP Progress Note Virtual Visit via Telephone Note  I connected with Tina [name] on 03/18/21 at 11:30 AM EDT by telephone and verified that I am speaking with the correct person using two identifiers.  Location: Patient: home Provider: Clinic   I discussed the limitations, risks, security and privacy concerns of performing an evaluation and management service by telephone and the availability of in person appointments. I also discussed with the patient that there may be a patient responsible charge related to this service. The patient expressed understanding and agreed to proceed.   I provided 30 minutes of non-face-to-face time during this encounter.   03/18/2021 12:05 PM Tina [name]  MRN:  [id number]  Chief Complaint: "Im not sleeping"  HPI: 50 year old female seen today for follow up psychiatric evaluation.   She has a psychiatric history of anxiety, depression, and bipolar 2 disorder.  She is currently managed on Lamictal 100 mg daily, trazodone 300 mg nightly, Zoloft 50 mg daily, Abilify 5 mg daily, and BuSpar 15 mg 3 times daily.  Patient also takes gabapentin 300 mg 3 times daily which she received from her PCP.   She notes that her medications are somewhat effective in managing her psychiatric conditions.   Today patient unable to log on virtually so the call was done over the phone. During assessment she was pleasant, cooperative, and engaged in conversation.  She informed provider that  she continues to grieve the loss of her mother.  She informed Probation officer that her mother passed away in 01-23-2023.  She notes that she has been handling her mother's affairs.  She informed Probation officer that her anxiety and depression continues to be elevated.  Provider conducted a GAD-7 and patient scored an 18, at her last visit she scored a 21.  Provider also conducted a PHQ-9 and patient scored a 23, at her last visit she scored a 24.  She notes that her sleep  continues to be poor noting that she sleeps 2 to 3 hours nightly.  She also endorses auditory and visual hallucinations.  She notes that she sees and hears her mother often.  Patient notes that her appetite fluctuates however notes that her weight has maintained.   Patient informed writer that her ankle continues to be painful.  She informed provider that gabapentin and Voltaren gel has been ineffective.  She notes that she will follow-up with her primary care doctor to discuss these concerns.  Patient notes that her mood continues to fluctuate however notes that it could be because of the loss of her mother.  She notes that she is more irritable and distractible.  She denies other symptoms of mania.  Today she is agreeable to starting Ambien 5 mg to help manage sleep.  At this time she request that her other medications not be adjusted.  She will follow-up with provider in 3 months for further evaluation.  She will also follow-up with outpatient counseling for therapy.  No other concerns noted at this time.     Visit Diagnosis:    ICD-10-CM   1. Insomnia due to other mental disorder  F51.05 zolpidem (AMBIEN) 5 MG tablet   F99   2. Bipolar 2 disorder, major depressive episode (HCC)  F31.81 ARIPiprazole (ABILIFY) 5 MG tablet    lamoTRIgine (LAMICTAL) 100 MG tablet    trazodone (DESYREL) 300 MG tablet  3. Generalized anxiety disorder  F41.1 sertraline (ZOLOFT) 50 MG tablet    Past Psychiatric History: anxiety, depression, and bipolar 2  disorder Past Medical History:  Past Medical History:  Diagnosis Date  . Diabetes mellitus    type II  . Fibroids   . HTN (hypertension)   . Hyperlipidemia   . Iron deficiency anemia   . Left acetabular fracture ([place])   . Lumbar strain   . Obesity   . Ovarian cyst     Past Surgical History:  Procedure Laterality Date  . FOOT ARTHRODESIS, MODIFIED [facility]BRIDE     right foot bunion surgery and ankle surgery 1980s  . 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 Colon cancer Sister   . Diabetes Mother   . Diabetes Father   . Diabetes Brother   . Celiac disease Paternal Aunt     Social History:  Social History   Socioeconomic History  . Marital status: Single    Spouse name: Not on file  . Number of children: 0  . Years of education: 64  . Highest education level: Some college, no degree  Occupational History  . Occupation: Engineer, materials: UNEMPLOYED    Employer: [employer], [employer]  Tobacco Use  . Smoking status: Current Every Day Smoker    Packs/day: 0.50    Years: 23.00    Pack years: 11.50    Types: Cigarettes  . Smokeless tobacco: Never Used  . Tobacco comment: 1/2PPD  Vaping Use  . Vaping Use: Never used  Substance and Sexual Activity  . Alcohol use: Yes    Alcohol/week: 0.0 standard drinks    Comment: occ  . Drug use: No  . Sexual activity: Yes    Partners: Female    Birth control/protection: Injection    Comment: Depo  Other Topics Concern  . Not on file  Social History Narrative   Homosexual; in a monogamous relationship w/ homosexual woman.   She is disabled.  She last worked in 2018 at [employer].    Right handed   One story home   Social Determinants of Health   Financial Resource Strain: High Risk  . Difficulty of Paying Living Expenses: Hard  Food Insecurity: No Food Insecurity  . Worried About Charity fundraiser in the Last Year: Never true  . Ran Out of Food in the Last Year: Never true  Transportation Needs: No Transportation Needs  . Lack of Transportation (Medical): No  . Lack of Transportation (Non-Medical): No  Physical Activity: Inactive  . Days of Exercise per Week: 0 days  . Minutes of Exercise per Session: 0 min  Stress: Stress Concern Present  . Feeling of Stress : Very much  Social Connections: Moderately Isolated  . Frequency of Communication  with Friends and Family: More than three times a week  . Frequency of Social Gatherings with Friends and Family: More than three times a week  . Attends Religious Services: More than 4 times per year  . Active Member of Clubs or Organizations: No  . Attends Archivist Meetings: Never  . Marital Status: Never married    Allergies:  Allergies  Allergen Reactions  . Codeine Other (See Comments)    hallucinations    Metabolic Disorder Labs: Lab Results  Component Value Date   HGBA1C 7.1 (A) 12/05/2020   MPG 200 05/25/2017   MPG 237 02/26/2016   No results found for: PROLACTIN Lab Results  Component Value  Date   CHOL 154 08/08/2020   TRIG 142 08/08/2020   HDL 35 (L) 08/08/2020   CHOLHDL 4.4 08/08/2020   VLDL 50 (H) 08/21/2014   LDLCALC 94 08/08/2020   LDLCALC 96 01/07/2019   Lab Results  Component Value Date   TSH 1.074 06/27/2011    Therapeutic Level Labs: No results found for: LITHIUM No results found for: VALPROATE No components found for:  CBMZ  Current Medications: Current Outpatient Medications  Medication Sig Dispense Refill  . zolpidem (AMBIEN) 5 MG tablet Take 1 tablet (5 mg total) by mouth at bedtime as needed for sleep. 30 tablet 2  . ARIPiprazole (ABILIFY) 5 MG tablet Take 1 tablet (5 mg total) by mouth daily. 30 tablet 2  . atorvastatin (LIPITOR) 20 MG tablet TAKE 2 TABLETS BY MOUTH ONCE A DAY 60 tablet 9  . atorvastatin (LIPITOR) 40 MG tablet Take 1 tablet (40 mg total) by mouth daily. 30 tablet 11  . Blood Glucose Monitoring Suppl (CONTOUR NEXT ONE) KIT 1 each by Does not apply route See admin instructions. USE TO CHECK BLOOD GLUCOSE ONCE DAILY. IM PROGRAM. 1 kit 0  . busPIRone (BUSPAR) 15 MG tablet Take 1 tablet (15 mg total) by mouth 3 (three) times daily. 90 tablet 2  . Canagliflozin-metFORMIN HCl [phone] MG TABS TAKE 1 TABLET BY MOUTH 2 (TWO) TIMES DAILY. 60 tablet 6  . diclofenac Sodium (VOLTAREN) 1 % GEL Apply 2 g topically 4 (four)  times daily. Rub into affected area of foot 2 to 4 times daily 100 g 2  . empagliflozin (JARDIANCE) 10 MG TABS tablet Take 1 tablet (10 mg total) by mouth daily. (Patient not taking: Reported on 02/01/2021) 30 tablet 2  . empagliflozin (JARDIANCE) 10 MG TABS tablet TAKE 1 TABLET (10 MG TOTAL) BY MOUTH DAILY. 30 tablet 2  . enalapril (VASOTEC) 20 MG tablet TAKE 1 TABLET (20 MG TOTAL) BY MOUTH DAILY. 90 tablet 3  . enalapril (VASOTEC) 20 MG tablet TAKE 1 TABLET (20 MG TOTAL) BY MOUTH DAILY. 90 tablet 2  . gabapentin (NEURONTIN) 300 MG capsule Take 1 capsule (300 mg total) by mouth 3 (three) times daily for 3 days, THEN 2 capsules (600 mg total) 3 (three) times daily. 549 capsule 0  . gabapentin (NEURONTIN) 300 MG capsule TAKE 1 CAPSULE BY MOUTH 3 TIMES DAILY FOR 3 DAYS, THEN 2 CAPSULES BY MOUTH 3 TIMES DAILY. 549 capsule 0  . gabapentin (NEURONTIN) 300 MG capsule TAKE 1 CAPSULE BY MOUTH THREE TIMES DAILY FOR 3 DAYS THEN 2 THREE TIMES DAILY. 549 capsule 0  . glucose blood (CONTOUR NEXT TEST) test strip Please use to check your sugar 3 times a day. 100 strip 3  . hydrOXYzine (ATARAX/VISTARIL) 50 MG tablet TAKE 1 TABLET (50 MG TOTAL) BY MOUTH 3 (THREE) TIMES DAILY. 90 tablet 2  . ibuprofen (ADVIL) 800 MG tablet Take 1 tablet (800 mg total) by mouth every 8 (eight) hours as needed. 30 tablet 0  . ibuprofen (ADVIL) 800 MG tablet TAKE 1 TABLET (800 MG TOTAL) BY MOUTH EVERY 8 (EIGHT) HOURS AS NEEDED. 30 tablet 0  . ibuprofen (ADVIL) 800 MG tablet TAKE 1 TABLET (800 MG TOTAL) BY MOUTH EVERY 8 (EIGHT) HOURS AS NEEDED. 30 tablet 0  . insulin aspart (NOVOLOG FLEXPEN) 100 UNIT/ML FlexPen Inject 10 Units into the skin 3 (three) times daily with meals. INJECT 10 UNITS INTO THE SKIN 3 TIMES DAILY WITH MEALS. (Patient not taking: Reported on 02/01/2021) 12 mL 3  .  insulin aspart (NOVOLOG) 100 UNIT/ML FlexPen INJECT 10 UNITS INTO THE SKIN 3 (THREE) TIMES DAILY WITH MEALS. 3 mL 3  . insulin glargine (LANTUS SOLOSTAR) 100  UNIT/ML Solostar Pen Inject 35 Units into the skin daily at 10 pm. (Patient not taking: Reported on 02/01/2021) 3 mL 2  . Insulin Pen Needle (UNIFINE PENTIPS) 31G X 5 MM MISC Inject 1 Units into the skin 3 (three) times daily. Use to inject insulin 4 times daily. IM PROGRAM 130 each 5  . Insulin Pen Needle 31G X 5 MM MISC INJECT 1 UNITS INTO THE SKIN 3 (THREE) TIMES DAILY. USE TO INJECT INSULIN 4 TIMES DAILY. IM PROGRAM 100 each 5  . lamoTRIgine (LAMICTAL) 100 MG tablet Take 1 tablet (100 mg total) by mouth daily. 30 tablet 2  . Lancets MISC 1 Units by Does not apply route 3 (three) times daily. 100 each 3  . metFORMIN (GLUCOPHAGE) 1000 MG tablet Take 1 tablet (1,000 mg total) by mouth 2 (two) times daily with a meal. 60 tablet 2  . metFORMIN (GLUCOPHAGE) 1000 MG tablet TAKE 1 TABLET (1,000 MG TOTAL) BY MOUTH 2 (TWO) TIMES DAILY WITH A MEAL. 60 tablet 2  . Multiple Vitamins-Minerals (MULTIVITAMIN WITH MINERALS) tablet Take 1 tablet by mouth daily.    . Semaglutide,0.25 or 0.5MG/DOS, (OZEMPIC, 0.25 OR 0.5 MG/DOSE,) 2 MG/1.5ML SOPN Inject 0.25 mg as directed once a week. 0.75 mL 0  . sertraline (ZOLOFT) 50 MG tablet Take 1 tablet (50 mg total) by mouth daily. 90 tablet 2  . trazodone (DESYREL) 300 MG tablet Take 1 tablet (300 mg total) by mouth at bedtime as needed for sleep. 30 tablet 2   No current facility-administered medications for this visit.     Musculoskeletal: Strength & Muscle Tone: Unable to assess due to telephone visit Gait & Station: Unable to assess due to telephone visit Patient leans: N/A  Psychiatric Specialty Exam: Review of Systems  There were no vitals taken for this visit.There is no height or weight on file to calculate BMI.  General Appearance: Unable to assess due to telephone visit  Eye Contact:  Unable to assess due to telephone visit  Speech:  Clear and Coherent and Normal Rate  Volume:  Normal  Mood:  Anxious and Depressed  Affect:  Appropriate and Congruent   Thought Process:  Coherent, Goal Directed and Linear  Orientation:  Full (Time, Place, and Person)  Thought Content: Logical and Hallucinations: Auditory Visual   Suicidal Thoughts:  No  Homicidal Thoughts:  No  Memory:  Immediate;   Good Recent;   Good Remote;   Good  Judgement:  Good  Insight:  Good  Psychomotor Activity:  Normal  Concentration:  Concentration: Good and Attention Span: Good  Recall:  Good  Fund of Knowledge: Good  Language: Good  Akathisia:  No  Handed:  Right  AIMS (if indicated):Not done  Assets:  Communication Skills Desire for Improvement Financial Resources/Insurance Housing Social Support  ADL's:  Intact  Cognition: WNL  Sleep:  Poor   Screenings: GAD-7   Flowsheet Row Video Visit from 03/18/2021 in [facility] Video Visit from 12/18/2020 in [facility] Video Visit from 11/12/2020 in [facility] Video Visit from 10/04/2020 in [place] from 11/17/2016 in [facility]  Total GAD-7 Score _0 PHQ2-9   Flowsheet Row Video Visit from 03/18/2021 in  [facility] Video Visit from 12/18/2020 in [facility] Office Visit from 12/05/2020 in [place] Video Visit from 11/12/2020 in [facility] Patient Outreach Telephone from 10/25/2020 in [place] [facility] Coordination  PHQ-2 Total Score _0 PHQ-9 Total Score _1 Flowsheet Row Video Visit from 03/18/2021 in [facility] Video Visit from 12/18/2020 in [facility] Error: Q7 should not be populated when Q6 is No Error: Q2 is Yes, you must answer 3, 4, and 5       Assessment and Plan: Patient endorses symptoms of anxiety,  depression, VAHand insomnia.  Today she is agreeable to starting Ambien 5 mg to help manage insomnia.  He notes at this time she does not want her other medications adjusted.  No other medication adjustments made today.  Patient agreeable to continue all other medications prescribed.  1. Bipolar 2 disorder, major depressive episode (HCC)  Continue- ARIPiprazole (ABILIFY) 5 MG tablet; Take 1 tablet (5 mg total) by mouth daily.  Dispense: 30 tablet; Refill: 2 Continue- lamoTRIgine (LAMICTAL) 100 MG tablet; Take 1 tablet (100 mg total) by mouth daily.  Dispense: 30 tablet; Refill: 2 Continue- trazodone (DESYREL) 300 MG tablet; Take 1 tablet (300 mg total) by mouth at bedtime as needed for sleep.  Dispense: 30 tablet; Refill: 2  2. Generalized anxiety disorder  Continue- sertraline (ZOLOFT) 50 MG tablet; Take 1 tablet (50 mg total) by mouth daily.  Dispense: 90 tablet; Refill: 2  3. Insomnia due to other mental disorder  Start- zolpidem (AMBIEN) 5 MG tablet; Take 1 tablet (5 mg total) by mouth at bedtime as needed for sleep.  Dispense: 30 tablet; Refill: 2    Follow up in 3 month  Follow up with therapy Salley Slaughter, NP 03/18/2021, 12:05 PM

## 2021-03-19 ENCOUNTER — Other Ambulatory Visit: Payer: Self-pay

## 2021-03-20 ENCOUNTER — Other Ambulatory Visit: Payer: Self-pay | Admitting: Obstetrics and Gynecology

## 2021-03-20 ENCOUNTER — Other Ambulatory Visit: Payer: Self-pay

## 2021-03-20 NOTE — Patient Outreach (Signed)
Medicaid Managed Care   Nurse Care Manager Note  03/20/2021 Name:  Tina [name] MRN:  [id number] DOB:  08-10-1971  Tina [name] is an 50 y.o. year old female who is a primary patient of Tina [name].  The [facility] Managed Care Coordination team was consulted for assistance with:    chronic healthcare management needs.  Tina [name] was given information about Medicaid Managed Care Coordination team services today. Tina [name] agreed to services and verbal consent obtained.  Engaged with patient by telephone for follow up visit in response to provider referral for case management and/or care coordination services.   Assessments/Interventions:  Review of past medical history, allergies, medications, health status, including review of consultants reports, laboratory and other test data, was performed as part of comprehensive evaluation and provision of chronic care management services.  SDOH (Social Determinants of Health) assessments and interventions performed:   Care Plan  Allergies  Allergen Reactions  . Codeine Other (See Comments)    hallucinations    Medications Reviewed Today    Reviewed by Tina Medicus, RN (Registered Nurse) on 03/20/21 at [phone]  Med List Status: <None>  Medication Order Taking? Sig Documenting Provider Last Dose Status Informant  ARIPiprazole (ABILIFY) 5 MG tablet [id number]  Take 1 tablet (5 mg total) by mouth daily. Tina [name] E, NP  Active   atorvastatin (LIPITOR) 20 MG tablet [id number]  TAKE 2 TABLETS BY MOUTH ONCE A DAY Tina Falcon, [name]  Active   atorvastatin (LIPITOR) 40 MG tablet [id number] No Take 1 tablet (40 mg total) by mouth daily. Tina Falcon, [name] Taking Active   Blood Glucose Monitoring Suppl (CONTOUR NEXT ONE) KIT [id number] No 1 each by Does not apply route See admin instructions. USE TO CHECK BLOOD GLUCOSE ONCE DAILY. IM PROGRAM. Tina Leff, [name] Taking Active   busPIRone  (BUSPAR) 15 MG tablet [id number]  Take 1 tablet (15 mg total) by mouth 3 (three) times daily. Tina [name] E, NP  Active   Canagliflozin-metFORMIN HCl [phone] MG TABS [id number]  TAKE 1 TABLET BY MOUTH 2 (TWO) TIMES DAILY. Tina Anis, [name]  Active   diclofenac Sodium (VOLTAREN) 1 % GEL [id number]  Apply 2 g topically 4 (four) times daily. Rub into affected area of foot 2 to 4 times daily Tina [name], Tina [name]  Active   empagliflozin (JARDIANCE) 10 MG TABS tablet [id number] No Take 1 tablet (10 mg total) by mouth daily.  Patient not taking: Reported on 02/01/2021   Tina Ochs, [name] Not Taking Active   empagliflozin (JARDIANCE) 10 MG TABS tablet [id number]  TAKE 1 TABLET (10 MG TOTAL) BY MOUTH DAILY. Tina Ochs, [name]  Active   enalapril (VASOTEC) 20 MG tablet [id number] No TAKE 1 TABLET (20 MG TOTAL) BY MOUTH DAILY. Tina Au, [name] Taking Active   enalapril (VASOTEC) 20 MG tablet [id number]  TAKE 1 TABLET (20 MG TOTAL) BY MOUTH DAILY. Tina Au, [name]  Active   gabapentin (NEURONTIN) 300 MG capsule [id number] No Take 1 capsule (300 mg total) by mouth 3 (three) times daily for 3 days, THEN 2 capsules (600 mg total) 3 (three) times daily. Tina Slaughter, NP Taking Active   gabapentin (NEURONTIN) 300 MG capsule [id number]  TAKE 1 CAPSULE BY MOUTH 3 TIMES DAILY FOR 3 DAYS, THEN 2 CAPSULES BY MOUTH 3 TIMES DAILY. Tina Slaughter, NP  Active   gabapentin (NEURONTIN) 300 MG capsule [id number]  TAKE 1 CAPSULE BY MOUTH THREE TIMES DAILY  FOR 3 DAYS THEN 2 THREE TIMES DAILY. Mitzi Hansen, [name]  Active   glucose blood (CONTOUR NEXT TEST) test strip [id number] No Please use to check your sugar 3 times a day. Tina Roche, [name] Taking Active   hydrOXYzine (ATARAX/VISTARIL) 50 MG tablet [id number]  Take 1 tablet (50 mg total) by mouth 3 (three) times daily. Tina Slaughter, NP  Active   ibuprofen (ADVIL) 800 MG tablet [id number] No Take 1 tablet (800 mg total) by mouth every 8 (eight)  hours as needed. Tina [name], Tina [name] Taking Active   ibuprofen (ADVIL) 800 MG tablet [id number]  TAKE 1 TABLET (800 MG TOTAL) BY MOUTH EVERY 8 (EIGHT) HOURS AS NEEDED. Tina [name], Tina [name]  Active   ibuprofen (ADVIL) 800 MG tablet [id number]  TAKE 1 TABLET (800 MG TOTAL) BY MOUTH EVERY 8 (EIGHT) HOURS AS NEEDED. Tina [name], Tina [name]  Active   insulin aspart (NOVOLOG FLEXPEN) 100 UNIT/ML FlexPen [id number] No Inject 10 Units into the skin 3 (three) times daily with meals. INJECT 10 UNITS INTO THE SKIN 3 TIMES DAILY WITH MEALS.  Patient not taking: Reported on 02/01/2021   Tina Anis, [name] Not Taking Active   insulin aspart (NOVOLOG) 100 UNIT/ML FlexPen [id number]  INJECT 10 UNITS INTO THE SKIN 3 (THREE) TIMES DAILY WITH MEALS. Tina Anis, [name]  Active   insulin glargine (LANTUS SOLOSTAR) 100 UNIT/ML Solostar Pen [id number] No Inject 35 Units into the skin daily at 10 pm.  Patient not taking: Reported on 02/01/2021   [place]ena Nunnery D, DO Not Taking Active   Insulin Pen Needle (UNIFINE PENTIPS) 31G X 5 MM MISC [id number] No Inject 1 Units into the skin 3 (three) times daily. Use to inject insulin 4 times daily. IM PROGRAM [name], [name], [name] Taking Active   Insulin Pen Needle 31G X 5 MM MISC [id number]  INJECT 1 UNITS INTO THE SKIN 3 (THREE) TIMES DAILY. USE TO INJECT INSULIN 4 TIMES DAILY. IM PROGRAM [name], [name], [name]  Active   lamoTRIgine (LAMICTAL) 100 MG tablet [id number]  Take 1 tablet (100 mg total) by mouth daily. Tina Slaughter, NP  Active   Lancets MISC [id number] No 1 Units by Does not apply route 3 (three) times daily. Tina Roche, [name] Taking Active   metFORMIN (GLUCOPHAGE) 1000 MG tablet [id number] No Take 1 tablet (1,000 mg total) by mouth 2 (two) times daily with a meal. Tina Ochs, [name] Not Taking Active   metFORMIN (GLUCOPHAGE) 1000 MG tablet [id number]  TAKE 1 TABLET (1,000 MG TOTAL) BY MOUTH 2 (TWO) TIMES DAILY WITH A MEAL. Tina Ochs, [name]  Active    Multiple Vitamins-Minerals (MULTIVITAMIN WITH MINERALS) tablet [id number] No Take 1 tablet by mouth daily. Provider, Historical, [name] Taking Active   Semaglutide,0.25 or 0.5MG/DOS, (OZEMPIC, 0.25 OR 0.5 MG/DOSE,) 2 MG/1.5ML SOPN [id number] No Inject 0.25 mg as directed once a week. Tina [name] Taking Active   sertraline (ZOLOFT) 50 MG tablet [id number]  Take 1 tablet (50 mg total) by mouth daily. Tina Slaughter, NP  Active   trazodone (DESYREL) 300 MG tablet [id number]  Take 1 tablet (300 mg total) by mouth at bedtime as needed for sleep. Tina Slaughter, NP  Active   zolpidem (AMBIEN) 5 MG tablet [id number]  Take 1 tablet (5 mg total) by mouth at bedtime as needed for sleep. Tina Slaughter, NP  Active           Patient Active Problem List   Diagnosis Date Noted  .  Insomnia due to other mental disorder 03/18/2021  . Generalized anxiety disorder 11/12/2020  . Severe recurrent major depression with psychotic features ([place]) 10/04/2020  . Bipolar 2 disorder, major depressive episode ([place]) 10/04/2020  . Pain in both feet 10/10/2019  . Fibroids 04/14/2019  . Financial difficulties 04/13/2018  . Seasonal allergies 02/06/2017  . Osteoarthritis of right ankle 08/29/2016  . Tobacco abuse 01/21/2016  . Severe obesity (BMI 35.0-39.9) 10/12/2015  . Foot ulcer ([place]) 09/13/2015  . Ovarian cyst 12/24/2012  . Right knee pain 12/09/2012  . Hypertension 07/20/2012  . Healthcare maintenance 06/27/2011  . Uncontrolled type 2 diabetes mellitus ([place]) 05/30/2010  . Dyslipidemia 05/30/2010  . Depression 05/30/2010  . 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    Conditions to be addressed/monitored per PCP order:  chronic healthcare management needs,  DM, HTN, depression, osteoarthritis.  Care Plan : Medication Management  Updates made by Tina Medicus, RN since 03/20/2021 12:00 AM    Problem: Health Promotion or Disease Self-Management (General Plan of Care)   Priority: High   Onset Date: 12/27/2020  Note:   Medicaid Managed Care (see longitudinal plan of care for additional care plan information)  Current Barriers:  . Chronic Disease Management support and education needs related to DM, HTN, ankle pain, anxiety/depression, smoking. . 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 Update 01/24/21:  patient states she has Medicaid now.  Nurse Case Manager Clinical Goal(s):  Marland Kitchen Over the next 30 days, patient will demonstrate improved adherence to prescribed treatment plan for DM as evidenced by checking blood sugars with glucometer and continuing medications as prescribed.  Update:  Patient states she is not checking her blood sugars-cannot fine glucometer.  Encouraged patient to follow up with her provider and/or purchase new one at [facility].  Patient states she can do this. Marland Kitchen Update 10/11/20:  Patient states she will purchase glucometer next week. Marland Kitchen Update 12/27/20:  Patient states she is not checking her blood sugars.  Patient's Hgb A1C=7.1% . Update 02/18/21:  Patient checking blood sugars twice a day. . Over the next 30 days, patient will consider smoking cessation techniques. Marland Kitchen Update 03/18/21:  Patient smoking 1/2 ppd-not interested in stopping right now.  Interventions:  . Inter-disciplinary care team collaboration (see longitudinal plan of care) . Advised patient to check blood sugars as recommended by provider. Marland Kitchen Update 02/18/21:  Patient checking blood sugars twice a day. . Provided education to patient re: importance of checking blood sugars. . Discussed plans with patient for ongoing care management follow up and provided patient with direct contact information for care management team . Patient will check blood pressure as directed. Marland Kitchen Update 03/20/21:  B/P = 136/62 per patient. . Provided patient with 1-800-QUIT Now phone number.  Will contact patient's PCP regarding possible prescription.  Patient does not want  to use patches or Chantix. Marland Kitchen Update 02/18/21:  Patient not currently checking blood sugars. Marland Kitchen Update 12/27/20:  Patient to follow up with provider regarding ankle pain. Marland Kitchen Update 01/24/21:  Patient seen and evaluated for ankle pain, currently wearing brace and taking Ibuprofen. Marland Kitchen Update 02/18/21:  Patient continues to wear ankle brace-has follow up appointment with provider 02/19/21. Marland Kitchen Update 03/20/21:  Continues to wear ankle brace-not much improvement-has follow up appointment with Dr. Jacqualyn Posey.  Plan:  . Patient will check blood sugars with glucometer.  Patient will check blood pressure.   . Patient will continue behavioral health therapy. Update 01/24/21:  Patient's Mother  recently died.  Patient has appointment with therapist 01/29/21. Update 02/18/21:  Patient continues therapy services. Update 03/20/21:  Patient continues therapy services which she finds helpful-medications adjusted by provider. Marland Kitchen RNCM will follow up with patient within 30 days.    Follow Up:  Patient agrees to Care Plan and Follow-up.  Plan: The Managed Medicaid care management team will reach out to the patient again over the next 30 days. and The patient has been provided with contact information for the Managed Medicaid care management team and has been advised to call with any health related questions or concerns.  Date/time of next scheduled RN care management/care coordination outreach:  04/20/21 at 0900.

## 2021-03-20 NOTE — Patient Instructions (Signed)
Hi Ms. Peterkin, thank you for speaking with me today.  Ms. Kotowski was given information about Medicaid Managed Care team care coordination services and verbally consented to engagement with the [facility] Managed Care team.    Ms. Centola - following are the goals we discussed in your visit today:  @GOALSADDRESSED   Patient verbalizes understanding of instructions provided today.   The Managed Medicaid care management team will reach out to the patient again over the next 30 days.  The patient has been provided with contact information for the Managed Medicaid care management team and has been advised to call with any health related questions or concerns.   Aida Raider RN, BSN [place]  Triad Curator - Managed Medicaid High Risk [phone].  Following is a copy of your plan of care:  Patient Care Plan: Medication Management    Problem Identified: Health Promotion or Disease Self-Management (General Plan of Care)   Priority: High  Onset Date: 12/27/2020  Note:   Medicaid Managed Care (see longitudinal plan of care for additional care plan information)  Current Barriers:  . Chronic Disease Management support and education needs related to DM, HTN, ankle pain, anxiety/depression, smoking. . 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 Update 01/24/21:  patient states she has Medicaid now.  Nurse Case Manager Clinical Goal(s):  Marland Kitchen Over the next 30 days, patient will demonstrate improved adherence to prescribed treatment plan for DM as evidenced by checking blood sugars with glucometer and continuing medications as prescribed.  Update:  Patient states she is not checking her blood sugars-cannot fine glucometer.  Encouraged patient to follow up with her provider and/or purchase new one at [facility].  Patient states she can do this. Marland Kitchen Update 10/11/20:  Patient states she will purchase  glucometer next week. Marland Kitchen Update 12/27/20:  Patient states she is not checking her blood sugars.  Patient's Hgb A1C=7.1% . Update 02/18/21:  Patient checking blood sugars twice a day. . Over the next 30 days, patient will consider smoking cessation techniques. Marland Kitchen Update 03/18/21:  Patient smoking 1/2 ppd-not interested in stopping right now.  Interventions:  . Inter-disciplinary care team collaboration (see longitudinal plan of care) . Advised patient to check blood sugars as recommended by provider. Marland Kitchen Update 02/18/21:  Patient checking blood sugars twice a day. . Provided education to patient re: importance of checking blood sugars. . Discussed plans with patient for ongoing care management follow up and provided patient with direct contact information for care management team . Patient will check blood pressure as directed. Marland Kitchen Update 03/20/21:  B/P = 136/62 per patient. . Provided patient with 1-800-QUIT Now phone number.  Will contact patient's PCP regarding possible prescription.  Patient does not want to use patches or Chantix. Marland Kitchen Update 02/18/21:  Patient not currently checking blood sugars. Marland Kitchen Update 12/27/20:  Patient to follow up with provider regarding ankle pain. Marland Kitchen Update 01/24/21:  Patient seen and evaluated for ankle pain, currently wearing brace and taking Ibuprofen. Marland Kitchen Update 02/18/21:  Patient continues to wear ankle brace-has follow up appointment with provider 02/19/21. Marland Kitchen Update 03/20/21:  Continues to wear ankle brace-not much improvement-has follow up appointment with Dr. Jacqualyn Posey.  Plan:  . Patient will check blood sugars with glucometer.  Patient will check blood pressure.   . Patient will continue behavioral health therapy. Update 01/24/21:  Patient's Mother recently died.  Patient has appointment with therapist 01/29/21. Update 02/18/21:  Patient continues therapy services.  Update 03/20/21:  Patient continues therapy services which she finds helpful-medications adjusted by  provider. Marland Kitchen RNCM will follow up with patient within 30 days.

## 2021-03-21 ENCOUNTER — Other Ambulatory Visit: Payer: Self-pay

## 2021-03-21 ENCOUNTER — Ambulatory Visit (INDEPENDENT_AMBULATORY_CARE_PROVIDER_SITE_OTHER): Payer: Medicaid Other | Admitting: Podiatry

## 2021-03-21 ENCOUNTER — Other Ambulatory Visit (HOSPITAL_COMMUNITY): Payer: Self-pay

## 2021-03-21 DIAGNOSIS — M216X1 Other acquired deformities of right foot: Secondary | ICD-10-CM

## 2021-03-21 DIAGNOSIS — M216X2 Other acquired deformities of left foot: Secondary | ICD-10-CM | POA: Diagnosis not present

## 2021-03-21 MED FILL — Metformin HCl Tab 1000 MG: ORAL | 30 days supply | Qty: 60 | Fill #0 | Status: AC

## 2021-03-22 NOTE — Progress Notes (Signed)
Subjective: 50 year old female presents the office today for evaluation of chronic bilateral foot discomfort and calluses.  She states the ankle still hurting on the right side source of brace.  Calluses are still causing discomfort.  Denies any open lesions states she has no other concerns today.   Objective: AAO x3, NAD DP/PT pulses palpable bilaterally, CRT less than 3 seconds Hyperkeratotic tissue present left medial second toe, left fifth MPJ, left submetatarsal 2, and first interspace right foot.  There is no underlying ulceration drainage or any signs of infection noted today. Tenderness to palpation along the anterior lateral aspect of the right ankle as well as the lateral aspect.  There is minimal edema to the ankle.  There is no erythema or warmth.  Mild discomfort ankle joint range of motion on the right side.  Overall unchanged. Bunion deformity present bilateral with hammertoe contracture most on the left foot worse than right. No pain with calf compression, swelling, warmth, erythema.    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 -In regards to the bunion, hammertoe we discussed surgical intervention as I think this will be helpful with the callus particularly on the left second toe.  This seems to be what started noticed the issues.  Discussed shoe modification and good arch supports.  As a courtesy debrided the hyperkeratotic lesions without any complications or bleeding.  She does not consider surgical options.  Return in about 4 weeks (around 04/18/2021).  [name] DPM

## 2021-03-26 ENCOUNTER — Ambulatory Visit (INDEPENDENT_AMBULATORY_CARE_PROVIDER_SITE_OTHER): Payer: Medicaid Other | Admitting: *Deleted

## 2021-03-26 DIAGNOSIS — D219 Benign neoplasm of connective and other soft tissue, unspecified: Secondary | ICD-10-CM

## 2021-03-26 MED ORDER — MEDROXYPROGESTERONE ACETATE 104 MG/0.65ML ~~LOC~~ SUSY
104.0000 mg | PREFILLED_SYRINGE | Freq: Once | SUBCUTANEOUS | Status: AC
  Administered 2021-03-26: 104 mg via SUBCUTANEOUS

## 2021-03-26 NOTE — Progress Notes (Signed)
Next depo injection - August 9 -23; appt card given to pt.

## 2021-03-28 ENCOUNTER — Ambulatory Visit (HOSPITAL_COMMUNITY): Payer: Medicaid Other | Admitting: Behavioral Health

## 2021-03-28 ENCOUNTER — Other Ambulatory Visit: Payer: Self-pay

## 2021-04-02 ENCOUNTER — Other Ambulatory Visit: Payer: Self-pay

## 2021-04-09 ENCOUNTER — Other Ambulatory Visit: Payer: Self-pay

## 2021-04-09 ENCOUNTER — Ambulatory Visit (INDEPENDENT_AMBULATORY_CARE_PROVIDER_SITE_OTHER): Payer: Medicaid Other | Admitting: Behavioral Health

## 2021-04-09 DIAGNOSIS — F3181 Bipolar II disorder: Secondary | ICD-10-CM

## 2021-04-11 ENCOUNTER — Ambulatory Visit (HOSPITAL_COMMUNITY): Payer: Medicaid Other | Admitting: Behavioral Health

## 2021-04-14 MED FILL — Empagliflozin Tab 10 MG: ORAL | 30 days supply | Qty: 30 | Fill #1 | Status: AC

## 2021-04-15 ENCOUNTER — Other Ambulatory Visit (HOSPITAL_COMMUNITY): Payer: Self-pay

## 2021-04-15 MED FILL — Atorvastatin Calcium Tab 20 MG (Base Equivalent): ORAL | 30 days supply | Qty: 60 | Fill #0 | Status: AC

## 2021-04-17 ENCOUNTER — Other Ambulatory Visit: Payer: Self-pay | Admitting: Obstetrics and Gynecology

## 2021-04-17 NOTE — Patient Instructions (Signed)
Hi Ms. Billiot, sorry I missed you today, I hope you are doing okay - as a part of your Medicaid benefit, you are eligible for care management and care coordination services at no cost or copay. I was unable to reach you by phone today but would be happy to help you with your health related needs. Please feel free to call me at [phone].  A member of the Managed Medicaid care management team will reach out to you again over the next 7 days.   Aida Raider RN, BSN [place]  [facility] Curator - Managed Medicaid High Risk [phone].

## 2021-04-17 NOTE — Patient Outreach (Signed)
Care Coordination  04/17/2021  [name] 08-24-1971 [id number]   Medicaid Managed Care   Unsuccessful Outreach Note  04/17/2021 Name: Tina [name] MRN: [id number] DOB: Jul 06, 1971  Referred by: [name], MD Reason for referral : High Risk Managed Medicaid (Unsuccessful telephone outreach)   An unsuccessful telephone outreach was attempted today. The patient was referred to the case management team for assistance with care management and care coordination.   Follow Up Plan: A member of the Managed Medicaid  care management team will reach out to the patient again over the next 7-14 days.   Aida Raider RN, BSN [place]  Triad Curator - Managed Medicaid High Risk [phone]

## 2021-04-23 ENCOUNTER — Ambulatory Visit: Payer: Medicaid Other | Admitting: Podiatry

## 2021-04-30 ENCOUNTER — Other Ambulatory Visit: Payer: Self-pay | Admitting: Obstetrics and Gynecology

## 2021-04-30 NOTE — Patient Instructions (Signed)
Hi Tina [name], sorry I missed you today, I hope you are doing okay - as a part of your Medicaid benefit, you are eligible for care management and care coordination services at no cost or copay. I was unable to reach you by phone today but would be happy to help you with your health related needs. Please feel free to call me at [phone].  A member of the Managed Medicaid care management team will reach out to you again over the next 7-14  days.   Aida Raider RN, BSN [place]  Triad Curator - Managed Medicaid High Risk [phone].

## 2021-04-30 NOTE — Patient Outreach (Signed)
Care Coordination  04/30/2021  [place] [birth year] [id number]   Medicaid Managed Care   Unsuccessful Outreach Note  04/30/2021 Name: Tina [name] MRN: [id number] DOB: [birth year]  Referred by: [name], MD Reason for referral : High Risk Managed Medicaid (Unsuccessful telephone Reita May)   A second unsuccessful telephone outreach was attempted today. The patient was referred to the case management team for assistance with care management and care coordination.   Follow Up Plan: The care management team will reach out to the patient again over the next 7-14 days.   Aida Raider RN, BSN [place]  Triad Curator - Managed Medicaid High Risk [phone].

## 2021-05-07 ENCOUNTER — Encounter: Payer: Self-pay | Admitting: *Deleted

## 2021-05-08 ENCOUNTER — Ambulatory Visit (HOSPITAL_COMMUNITY): Payer: Medicaid Other | Admitting: Behavioral Health

## 2021-05-13 ENCOUNTER — Other Ambulatory Visit (HOSPITAL_COMMUNITY): Payer: Self-pay

## 2021-05-16 NOTE — Progress Notes (Signed)
   THERAPIST PROGRESS NOTE  Virtual Visit via Telephone Note  I connected with Tina [name] on 04/09/2021 at  4:00 PM EDT by telephone and verified that I am speaking with the correct person using two identifiers.  Location: Patient: [place], [place] Provider: Clinical Home - [facility]   I discussed the limitations, risks, security and privacy concerns of performing an evaluation and management service by telephone and the availability of in person appointments. I also discussed with the patient that there may be a patient responsible charge related to this service. The patient expressed understanding and agreed to proceed.  I discussed the assessment and treatment plan with the patient. The patient was provided an opportunity to ask questions and all were answered. The patient agreed with the plan and demonstrated an understanding of the instructions.   The patient was advised to call back or seek an in-person evaluation if the symptoms worsen or if the condition fails to improve as anticipated.  I provided 45 minutes of non-face-to-face time during this encounter.   Allyne Gee, Counselor   Session Time: 75mins  Participation Level: Active  Behavioral Response: NAAlertHopeless  Type of Therapy: Individual Therapy  Treatment Goals addressed: Coping  Interventions: Supportive  Summary: Tina [name] is a 50 y.o. female who presents today for a scheduled virtual session with this Probation officer. Client talked about her emotions and feelings as it relates to the death of her mother. Client reports "I'm still mourning the loss of my mother and I haven't been wanting to go anywhere lately". Clt reports she has been leaning on the support of her family to help her get through her tough days. Clt identified being in different stages of grief "at different times ... it just depends on the day". Clt further reports "I had a cookout for memorial day weekend with my  family.  Suicidal/Homicidal: No  Therapist Response:  - Therapist offered encouragement and support. - Therapist explored stages of grief with client. - Therapist validated client's feelings as it relates to grief and loss.  Plan: Return again in 2 weeks.  Diagnosis: Axis I: Bipolar, Depressed    Axis II: No diagnosis    Allyne Gee, Counselor 04/09/2021

## 2021-05-27 ENCOUNTER — Other Ambulatory Visit: Payer: Self-pay | Admitting: Internal Medicine

## 2021-05-27 MED FILL — Metformin HCl Tab 1000 MG: ORAL | 30 days supply | Qty: 60 | Fill #1 | Status: AC

## 2021-05-27 MED FILL — Atorvastatin Calcium Tab 20 MG (Base Equivalent): ORAL | 30 days supply | Qty: 60 | Fill #1 | Status: CN

## 2021-05-28 ENCOUNTER — Other Ambulatory Visit (HOSPITAL_COMMUNITY): Payer: Self-pay

## 2021-05-28 ENCOUNTER — Other Ambulatory Visit: Payer: Self-pay

## 2021-05-28 ENCOUNTER — Encounter: Payer: Self-pay | Admitting: Podiatry

## 2021-05-28 ENCOUNTER — Ambulatory Visit (INDEPENDENT_AMBULATORY_CARE_PROVIDER_SITE_OTHER): Payer: Medicaid Other | Admitting: Podiatry

## 2021-05-28 ENCOUNTER — Other Ambulatory Visit: Payer: Self-pay | Admitting: Internal Medicine

## 2021-05-28 DIAGNOSIS — M216X1 Other acquired deformities of right foot: Secondary | ICD-10-CM

## 2021-05-28 DIAGNOSIS — M19071 Primary osteoarthritis, right ankle and foot: Secondary | ICD-10-CM

## 2021-05-28 DIAGNOSIS — E1149 Type 2 diabetes mellitus with other diabetic neurological complication: Secondary | ICD-10-CM

## 2021-05-28 MED FILL — Atorvastatin Calcium Tab 20 MG (Base Equivalent): ORAL | 30 days supply | Qty: 60 | Fill #0 | Status: CN

## 2021-05-29 ENCOUNTER — Encounter: Payer: Self-pay | Admitting: Internal Medicine

## 2021-05-29 ENCOUNTER — Telehealth: Payer: Self-pay | Admitting: Internal Medicine

## 2021-05-29 ENCOUNTER — Other Ambulatory Visit (HOSPITAL_COMMUNITY): Payer: Self-pay

## 2021-05-29 DIAGNOSIS — E1165 Type 2 diabetes mellitus with hyperglycemia: Secondary | ICD-10-CM

## 2021-05-29 MED ORDER — CANAGLIFLOZIN-METFORMIN HCL 150-1000 MG PO TABS
1.0000 | ORAL_TABLET | Freq: Two times a day (BID) | ORAL | 2 refills | Status: DC
  Filled 2021-05-29: qty 60, 30d supply, fill #0

## 2021-05-29 MED ORDER — FLUCONAZOLE 150 MG PO TABS
150.0000 mg | ORAL_TABLET | Freq: Once | ORAL | 0 refills | Status: AC
  Filled 2021-05-29: qty 1, 1d supply, fill #0

## 2021-05-29 MED ORDER — SEMAGLUTIDE(0.25 OR 0.5MG/DOS) 2 MG/1.5ML ~~LOC~~ SOPN
0.5000 mg | PEN_INJECTOR | SUBCUTANEOUS | 2 refills | Status: DC
  Filled 2021-05-29 – 2021-06-03 (×2): qty 1.5, 28d supply, fill #0

## 2021-05-29 NOTE — Telephone Encounter (Signed)
Called patient to confirm if she was taking Janumet. She reports she is taking Ozempic, Semaglutide, and Metformin. She reports she now has Medicaid. After phone call I saw notes from [facility] who documented patient is on Ozempic 0.'25mg'$  once weekly and Janumet 50-'1000mg'$  BID because she no longer qualified for patient assistance program. It is late in the evening and I will send patient a mychart message to clarify her regimen.   In addition she is experiencing vaginal itching ,similar to when Dr.Santos treated her for yeast infection and BV. Her blood sugars have been running high. Recommended holding emaglifozin and I would send medication for possible yeast infection. Recommended calling clinic tomorrow to schedule appointment with me next week so we could better evaluate her.   Final recommendations: Increase Ozempic 0.5 mg once weekly Continue Janumet 50-'1000mg'$  BID Diflucan 150 mg once Patient will call and make appointment with me in clinic for follow up. I will send mychart message and call patient if not viewed.

## 2021-05-30 ENCOUNTER — Other Ambulatory Visit: Payer: Self-pay | Admitting: Internal Medicine

## 2021-05-30 ENCOUNTER — Other Ambulatory Visit (HOSPITAL_COMMUNITY): Payer: Self-pay

## 2021-05-30 ENCOUNTER — Telehealth: Payer: Self-pay | Admitting: *Deleted

## 2021-05-30 DIAGNOSIS — E1165 Type 2 diabetes mellitus with hyperglycemia: Secondary | ICD-10-CM

## 2021-05-30 MED ORDER — ATORVASTATIN CALCIUM 40 MG PO TABS
40.0000 mg | ORAL_TABLET | Freq: Every day | ORAL | 11 refills | Status: DC
  Filled 2021-05-30: qty 30, 30d supply, fill #0
  Filled 2021-07-12: qty 30, 30d supply, fill #1
  Filled 2021-08-18: qty 30, 30d supply, fill #2
  Filled 2021-09-23: qty 30, 30d supply, fill #3
  Filled 2021-11-04: qty 30, 30d supply, fill #4
  Filled 2021-12-10: qty 30, 30d supply, fill #5
  Filled 2022-01-07: qty 30, 30d supply, fill #6

## 2021-05-30 NOTE — Telephone Encounter (Signed)
Ozempic not covered by patient's insurance will need to try and fail Victoza, Byetta, Bydureon or Trulicity.  Message to be sent to the Yellow Team to consider a change.  Sander Nephew, RN 05/30/2021 12:05 PM.

## 2021-05-30 NOTE — Progress Notes (Signed)
Refilled - atorvastatin (LIPITOR) 40 MG tablet; Take 1 tablet (40 mg total) by mouth daily.  Dispense: 30 tablet; Refill: 11

## 2021-05-31 ENCOUNTER — Other Ambulatory Visit (HOSPITAL_COMMUNITY): Payer: Self-pay

## 2021-05-31 NOTE — Progress Notes (Signed)
Subjective: 50 year old female presents the office today for evaluation of chronic bilateral foot discomfort and calluses.  She states the ankle still hurting.  Calluses are also causing discomfort.  Denies any open lesions states she has no other concerns today.  No recent injury or changes.  No open sores that she reports.  Last A1c was 7.1 on December 05, 2020  Objective: AAO x3, NAD DP/PT pulses palpable bilaterally, CRT less than 3 seconds Hyperkeratotic tissue present left medial second toe, left fifth MPJ, left submetatarsal 2, and first interspace right foot.  There is no underlying ulceration drainage or any signs of infection noted today.  Overall the calluses seem to be doing pretty well today compared to previous. Tenderness to palpation along the anterior lateral aspect of the right ankle as well as the lateral aspect.  There is minimal edema to the ankle.  There is no erythema or warmth.  Mild discomfort ankle joint range of motion on the right side.  Overall unchanged. Bunion deformity present bilateral with hammertoe contracture most on the left foot worse than right. No pain with calf compression, swelling, warmth, erythema.    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 -Debride the hyperkeratotic lesions any complications or bleeding as a courtesy. -Regards to the right ankle pain this is been ongoing for many years.  She is tried conservative treatments including bracing, offloading shoe modifications, injections.  At this time we will order an MRI for surgical planning.  Return in about 4 weeks (around 06/25/2021).  [name] DPM

## 2021-06-03 ENCOUNTER — Other Ambulatory Visit (HOSPITAL_COMMUNITY): Payer: Self-pay

## 2021-06-04 ENCOUNTER — Other Ambulatory Visit (HOSPITAL_COMMUNITY): Payer: Self-pay

## 2021-06-04 ENCOUNTER — Other Ambulatory Visit: Payer: Self-pay

## 2021-06-04 ENCOUNTER — Telehealth: Payer: Self-pay | Admitting: Internal Medicine

## 2021-06-04 DIAGNOSIS — E1165 Type 2 diabetes mellitus with hyperglycemia: Secondary | ICD-10-CM

## 2021-06-04 MED ORDER — EMPAGLIFLOZIN 10 MG PO TABS
10.0000 mg | ORAL_TABLET | Freq: Every day | ORAL | 1 refills | Status: DC
Start: 2021-06-04 — End: 2021-06-27
  Filled 2021-06-04 – 2021-06-26 (×2): qty 90, 90d supply, fill #0

## 2021-06-04 MED ORDER — TRULICITY 1.5 MG/0.5ML ~~LOC~~ SOAJ
1.5000 mg | SUBCUTANEOUS | 2 refills | Status: DC
  Filled 2021-06-04 – 2021-06-26 (×2): qty 2, 28d supply, fill #0

## 2021-06-04 MED ORDER — METFORMIN HCL 1000 MG PO TABS
1000.0000 mg | ORAL_TABLET | Freq: Two times a day (BID) | ORAL | 1 refills | Status: DC
  Filled 2021-06-04 – 2021-07-22 (×2): qty 180, 90d supply, fill #0
  Filled 2022-01-07: qty 180, 90d supply, fill #1

## 2021-06-04 NOTE — Telephone Encounter (Signed)
Insurance did not cover [place]. Sending refills for patient current regimen which was discussed via telephone today. See below.  Type 2 diabetes Mellitus - Dulaglutide (TRULICITY) 1.5 0000000 SOPN; Inject 1.5 mg into the skin once a week.  Dispense: 2 mL; Refill: 2 - empagliflozin (JARDIANCE) 10 MG TABS tablet; Take 1 tablet (10 mg total) by mouth daily.  Dispense: 90 tablet; Refill: 1 - metFORMIN (GLUCOPHAGE) 1000 MG tablet; Take 1 tablet (1,000 mg total) by mouth 2 (two) times daily with a meal.  Dispense: 180 tablet; Refill: 1

## 2021-06-06 ENCOUNTER — Telehealth: Payer: Self-pay | Admitting: Internal Medicine

## 2021-06-06 ENCOUNTER — Other Ambulatory Visit (HOSPITAL_COMMUNITY): Payer: Self-pay

## 2021-06-06 NOTE — Telephone Encounter (Signed)
..  Patient declines further follow up and engagement by the Managed Medicaid Team. Appropriate care team members and provider have been notified via electronic communication. The Managed Medicaid Team is available to follow up with the patient after provider conversation with the patient regarding recommendation for engagement and subsequent re-referral to the Managed Medicaid Team.    Jennifer Alley Care Guide, High Risk Medicaid Managed Care Embedded Care Coordination [place]  [facility]   

## 2021-06-07 ENCOUNTER — Telehealth: Payer: Self-pay | Admitting: *Deleted

## 2021-06-07 ENCOUNTER — Other Ambulatory Visit: Payer: Self-pay

## 2021-06-07 NOTE — Telephone Encounter (Signed)
-----   Message from [name], DPM sent at 05/31/2021  1:15 PM EDT ----- Can you follow up on the MRI I ordered? Thanks!

## 2021-06-07 NOTE — Telephone Encounter (Signed)
Called and spoke with [facility] and still waiting on authorization. Tina [name]

## 2021-06-12 ENCOUNTER — Other Ambulatory Visit (HOSPITAL_COMMUNITY): Payer: Self-pay

## 2021-06-12 ENCOUNTER — Other Ambulatory Visit: Payer: Self-pay

## 2021-06-17 ENCOUNTER — Other Ambulatory Visit: Payer: Self-pay

## 2021-06-17 ENCOUNTER — Telehealth (HOSPITAL_COMMUNITY): Payer: Medicaid Other | Admitting: Psychiatry

## 2021-06-21 ENCOUNTER — Ambulatory Visit
Admission: RE | Admit: 2021-06-21 | Discharge: 2021-06-21 | Disposition: A | Discharge status: Home / Self Care | Payer: Medicaid Other | Source: Ambulatory Visit | Attending: Podiatry | Admitting: Podiatry

## 2021-06-21 ENCOUNTER — Other Ambulatory Visit: Payer: Self-pay

## 2021-06-21 DIAGNOSIS — M19071 Primary osteoarthritis, right ankle and foot: Secondary | ICD-10-CM

## 2021-06-25 ENCOUNTER — Telehealth: Payer: Self-pay

## 2021-06-25 NOTE — Telephone Encounter (Signed)
Requesting to speak with a nurse about Depo shot. Please call pt back.

## 2021-06-25 NOTE — Telephone Encounter (Signed)
RTC, patient states her Depo is due today.  New formulation of Depo recommends repeat injection in 12-14 weeks.  Pt had last Depo on 03/26/21 and is due for Depo 8/16 - 8/30.  She was informed she is able to get this tomorrow at her appt w/ PCP. SChaplin, RN,BSN

## 2021-06-26 ENCOUNTER — Ambulatory Visit (INDEPENDENT_AMBULATORY_CARE_PROVIDER_SITE_OTHER): Payer: Medicaid Other | Admitting: Internal Medicine

## 2021-06-26 ENCOUNTER — Other Ambulatory Visit (HOSPITAL_COMMUNITY): Payer: Self-pay

## 2021-06-26 VITALS — BP 134/76 | HR 101 | Temp 98.8°F | Wt 247.6 lb

## 2021-06-26 DIAGNOSIS — Z1231 Encounter for screening mammogram for malignant neoplasm of breast: Secondary | ICD-10-CM

## 2021-06-26 DIAGNOSIS — E1165 Type 2 diabetes mellitus with hyperglycemia: Secondary | ICD-10-CM

## 2021-06-26 DIAGNOSIS — I10 Essential (primary) hypertension: Secondary | ICD-10-CM

## 2021-06-26 DIAGNOSIS — B373 Candidiasis of vulva and vagina: Secondary | ICD-10-CM

## 2021-06-26 DIAGNOSIS — D219 Benign neoplasm of connective and other soft tissue, unspecified: Secondary | ICD-10-CM | POA: Diagnosis not present

## 2021-06-26 DIAGNOSIS — Z1211 Encounter for screening for malignant neoplasm of colon: Secondary | ICD-10-CM | POA: Diagnosis not present

## 2021-06-26 DIAGNOSIS — B3731 Acute candidiasis of vulva and vagina: Secondary | ICD-10-CM

## 2021-06-26 LAB — POCT GLYCOSYLATED HEMOGLOBIN (HGB A1C): HbA1c POC (<> result, manual entry): >14 % — AB (ref 4.0–5.6)

## 2021-06-26 LAB — GLUCOSE, CAPILLARY: Glucose-Capillary: 382 mg/dL — ABNORMAL HIGH (ref 70–99)

## 2021-06-26 MED ORDER — INSULIN PEN NEEDLE 32G X 4 MM MISC
1.0000 [IU] | Freq: Every day | 4 refills | Status: DC
  Filled 2021-06-26: qty 100, 90d supply, fill #0
  Filled 2022-04-10: qty 100, 100d supply, fill #0

## 2021-06-26 MED ORDER — FLUCONAZOLE 150 MG PO TABS
150.0000 mg | ORAL_TABLET | Freq: Every day | ORAL | 0 refills | Status: DC
  Filled 2021-06-26: qty 2, 2d supply, fill #0

## 2021-06-26 MED ORDER — MEDROXYPROGESTERONE ACETATE 104 MG/0.65ML ~~LOC~~ SUSY
104.0000 mg | PREFILLED_SYRINGE | Freq: Once | SUBCUTANEOUS | Status: AC
  Administered 2021-06-26: 104 mg via SUBCUTANEOUS

## 2021-06-26 MED ORDER — INSULIN GLARGINE 100 UNITS/ML SOLOSTAR PEN
35.0000 [IU] | PEN_INJECTOR | Freq: Every day | SUBCUTANEOUS | 11 refills | Status: DC
Start: 2021-06-26 — End: 2021-07-03
  Filled 2021-06-26: qty 15, 42d supply, fill #0

## 2021-06-26 NOTE — Patient Instructions (Addendum)
Thank you for trusting me with your care. To recap, today we discussed the following:  - POC Hbg A1C - Referral to Nutrition and Diabetes Services - Ambulatory referral to Ophthalmology - MM Digital Screening; Future - Ambulatory referral to Gastroenterology - fluconazole (DIFLUCAN) 150 MG tablet; Take 1 tablet (150 mg total) by mouth daily.  Dispense: 2 tablet; Refill: 0  Diabetic medications - Dulaglutide (TRULICITY) 1.5 0000000 SOPN; Inject 1.5 mg into the skin once a week.  Dispense: 2 mL; Refill: 2 - empagliflozin (JARDIANCE) 10 MG TABS tablet; Take 1 tablet (10 mg total) by mouth daily.  Dispense: 90 tablet; Refill: 1 - metFORMIN (GLUCOPHAGE) 1000 MG tablet; Take 1 tablet (1,000 mg total) by mouth 2 (two) times daily with a meal.  Dispense: 180 tablet; Refill: 1 - Lantus 35 units once daily

## 2021-06-26 NOTE — Progress Notes (Signed)
   CC: hypertension and diabetes   HPI:[name] is a 50 y.o. female who presents for evaluation of hypertension and diabetes. Please see individual problem based A/P for details.  Past Medical History:  Diagnosis Date   Diabetes mellitus    type II   Fibroids    HTN (hypertension)    Hyperlipidemia    Iron deficiency anemia    Left acetabular fracture (HCC)    Lumbar strain    Obesity    Ovarian cyst    Review of Systems:   Review of Systems  Constitutional:  Negative for chills and fever.  Genitourinary:  Negative for dysuria.  Endo/Heme/Allergies:        Polyuria  Psychiatric/Behavioral:  Positive for depression (After loss of mother, managed by another provider and recently had adjustment to medication regimen). The patient is not nervous/anxious.     Physical Exam: Vitals:   06/26/21 1018  BP: 134/76  Pulse: (!) 101  Temp: 98.8 F (37.1 C)  TempSrc: Oral  SpO2: 100%  Weight: 247 lb 9.6 oz (112.3 kg)   General: Middle age women sitting in wheelchair HEENT: Conjunctiva nl , antiicteric sclerae,  Cardiovascular: Normal rate, regular rhythm.  No murmurs, rubs, or gallops Pulmonary : Equal breath sounds, No wheezes, rales, or rhonchi Abdominal: soft, nontender,  bowel sounds present Ext: No edema in lower extremities, 2+ pulses, flatfoot deformity on right foot, surgical scar  Assessment & Plan:   See Encounters Tab for problem based charting.  Patient discussed with Dr. Evette Doffing

## 2021-06-27 ENCOUNTER — Ambulatory Visit: Payer: Medicaid Other | Admitting: Dietician

## 2021-06-27 ENCOUNTER — Telehealth: Payer: Self-pay | Admitting: Internal Medicine

## 2021-06-27 ENCOUNTER — Encounter: Payer: Self-pay | Admitting: Dietician

## 2021-06-27 ENCOUNTER — Ambulatory Visit (INDEPENDENT_AMBULATORY_CARE_PROVIDER_SITE_OTHER): Payer: Medicaid Other | Admitting: Internal Medicine

## 2021-06-27 ENCOUNTER — Encounter: Payer: Self-pay | Admitting: Internal Medicine

## 2021-06-27 DIAGNOSIS — E1165 Type 2 diabetes mellitus with hyperglycemia: Secondary | ICD-10-CM

## 2021-06-27 LAB — POCT URINALYSIS DIPSTICK
Bilirubin, UA: NEGATIVE
Blood, UA: NEGATIVE
Glucose, UA: POSITIVE — AB
Ketones, UA: NEGATIVE
Leukocytes, UA: NEGATIVE
Nitrite, UA: NEGATIVE
Protein, UA: NEGATIVE
Spec Grav, UA: <=1.005 — AB (ref 1.010–1.025)
Urobilinogen, UA: 0.2 E.U./dL
pH, UA: 5.5 (ref 5.0–8.0)

## 2021-06-27 LAB — BMP8+ANION GAP
Anion Gap: 22 mmol/L — ABNORMAL HIGH (ref 10.0–18.0)
BUN/Creatinine Ratio: 15 (ref 9–23)
BUN: 11 mg/dL (ref 6–24)
CO2: 16 mmol/L — ABNORMAL LOW (ref 20–29)
Calcium: 10.3 mg/dL — ABNORMAL HIGH (ref 8.7–10.2)
Chloride: 95 mmol/L — ABNORMAL LOW (ref 96–106)
Creatinine, Ser: 0.75 mg/dL (ref 0.57–1.00)
Glucose: 314 mg/dL — ABNORMAL HIGH (ref 65–99)
Potassium: 4.3 mmol/L (ref 3.5–5.2)
Sodium: 133 mmol/L — ABNORMAL LOW (ref 134–144)
eGFR: 96 mL/min/{1.73_m2} (ref >59)

## 2021-06-27 LAB — BASIC METABOLIC PANEL
Anion gap: 12 (ref 5–15)
BUN: 9 mg/dL (ref 6–20)
CO2: 22 mmol/L (ref 22–32)
Calcium: 8.8 mg/dL — ABNORMAL LOW (ref 8.9–10.3)
Chloride: 99 mmol/L (ref 98–111)
Creatinine, Ser: 0.6 mg/dL (ref 0.44–1.00)
GFR, Estimated: >60 mL/min (ref >60)
Glucose, Bld: 339 mg/dL — ABNORMAL HIGH (ref 70–99)
Potassium: 3.7 mmol/L (ref 3.5–5.1)
Sodium: 133 mmol/L — ABNORMAL LOW (ref 135–145)

## 2021-06-27 NOTE — Telephone Encounter (Signed)
Spoke to Tina [name]. She reports she is feeling normal and we discussed concern for DKA base on lab work. She is going to be added to my schedule this afternoon and will receive further workup.

## 2021-06-27 NOTE — Assessment & Plan Note (Signed)
Hx of fibroids, received Depo-Provera injection today.

## 2021-06-27 NOTE — Assessment & Plan Note (Addendum)
Hypertension: Patient's BP today is 134/76 with a goal of <125-130/80 in setting of being diabetic.    Assessment/Plan: HTN, close to goal. No adjustments today. Patient has chronic foot deformities and surgical changes which make ambulating difficult.  - continue enalapril

## 2021-06-27 NOTE — Progress Notes (Signed)
   CC: type 2 diabetes mellitus with hyperglycemia  HPI:Tina [name] is a 50 y.o. female who presents for evaluation of type 2 diabetes mellitus with hyperglycemia. Please see individual problem based A/P for details.   Past Medical History:  Diagnosis Date   Diabetes mellitus    type II   Fibroids    HTN (hypertension)    Hyperlipidemia    Iron deficiency anemia    Left acetabular fracture (HCC)    Lumbar strain    Obesity    Ovarian cyst    Review of Systems:   Review of Systems  Eyes:  Negative for blurred vision and double vision.  Gastrointestinal:  Negative for nausea and vomiting.  Neurological:  Negative for dizziness and headaches.    Physical Exam: Vitals:   06/27/21 1427  BP: (!) 145/79  Pulse: 92  Temp: 98.5 F (36.9 C)  SpO2: 100%  Weight: 247 lb (112 kg)  Height: '6\' 4"'$  (1.93 m)   General: NAD, Obese HEENT: Conjunctiva nl , antiicteric sclerae, moist mucous membranes, no exudate or erythema Cardiovascular: Normal rate, regular rhythm.  No murmurs, rubs, or gallops Pulmonary : Equal breath sounds, No wheezes, rales, or rhonchi Abdominal: soft, nontender,  bowel sounds present   Assessment & Plan:   See Encounters Tab for problem based charting.  Patient discussed with Dr. Evette Doffing

## 2021-06-27 NOTE — Progress Notes (Signed)
Documentation for Freestyle Libre Pro Continuous glucose monitoring Freestyle Libre Pro CGM sensor placed today. Patient was educated about wearing sensor, keeping food, activity and medication log and when to call office. Patient was educated about how to care for the sensor and not to have an MRI, CT or Diathermy while wearing the sensor. Follow up was arranged with the patient for 1 week.   Lot #: [id number] Serial #: [id number] Expiration Date: 11/02/21  Debera Lat, RD 06/27/2021 3:56 PM.

## 2021-06-27 NOTE — Patient Instructions (Addendum)
Diabetic Ketoacidosis (DKA)   Thank you for trusting me with your care. To recap, today we discussed the following:   I will call you this afternoon with the results of your lab work.   Please return in 7 days with [place] for Download #1.

## 2021-06-27 NOTE — Assessment & Plan Note (Signed)
B Hgb A1c 7.1 % at the last visit. Current A1c >14 %. Patient does not have her glucometer today.  She feels like she probably has a yeast infection.  Has been urinating more frequently and says her blood sugar has been high when checking. She is off of Insulin entirely. She does not like injections.   Assessment/plan: Type 2 diabetes uncontrolled, plan to add back Lantus 35 units nightly.  Previously also on NovoLog 3 times daily, at this time patient would like to keep injections at a minimum and I believe this is best for adherence.  Currently on biguanid, SGLT2 inhibitor, and GLP-1 agonist. Also will ask for patient to be scheduled with Butch Penny for CGM.   - Dulaglutide (TRULICITY) 1.5 0000000 SOPN; Inject 1.5 mg into the skin once a week. Dispense: 2 mL; Refill: 2 - empagliflozin (JARDIANCE) 10 MG TABS tablet; Take 1 tablet (10 mg total) by mouth daily. Dispense: 90 tablet; Refill: 1 - metFORMIN (GLUCOPHAGE) 1000 MG tablet; Take 1 tablet (1,000 mg total) by mouth 2 (two) times daily with a meal. Dispense: 180 tablet; Refill: 1 - Lantus 35 untis qhs -CGM

## 2021-06-27 NOTE — Progress Notes (Signed)
Internal Medicine Clinic Attending  Case discussed with Dr. Court Joy  At the time of the visit.  We reviewed the resident's history and exam and pertinent patient test results.  I agree with the assessment, diagnosis, and plan of care documented in the resident's note.   Anion gap metabolic acidosis with hyperglycemia noted on routine labs. Will need to bring her back to clinic today for repeat and to check for urine ketones.

## 2021-07-01 ENCOUNTER — Encounter: Payer: Self-pay | Admitting: Internal Medicine

## 2021-07-01 NOTE — Assessment & Plan Note (Signed)
HPI: Patient's labs on office visit 8/24 concerning for DKA.  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  She is asymptomatic.    Assessment/Plan: Type 2 diabetes mellitus with hyperglycemia, repeat labs wnl.  -CGM placed today, follow up in 1 week. Stop Jardiance given patient is ketosis prone -Continue dulaglutide (trulicity) 1.5 once weekly -Continue metformin 1000 mg twice daily daily -Continue Lantus 35 units nightly

## 2021-07-02 ENCOUNTER — Encounter: Payer: Self-pay | Admitting: Podiatry

## 2021-07-02 ENCOUNTER — Ambulatory Visit: Payer: Medicaid Other | Admitting: Podiatry

## 2021-07-02 ENCOUNTER — Other Ambulatory Visit: Payer: Self-pay

## 2021-07-02 DIAGNOSIS — M7751 Other enthesopathy of right foot: Secondary | ICD-10-CM | POA: Diagnosis not present

## 2021-07-02 NOTE — Progress Notes (Signed)
Internal Medicine Clinic Attending  Case discussed with Dr. Steen  At the time of the visit.  We reviewed the resident's history and exam and pertinent patient test results.  I agree with the assessment, diagnosis, and plan of care documented in the resident's note.  

## 2021-07-02 NOTE — Progress Notes (Signed)
Subjective: 50 year old female presents the office today for evaluation of chronic bilateral foot discomfort and calluses.  Presents to discuss MRI results as well as with the calluses.  No open sores that she reports.  No recent injury or changes otherwise.  Recently had her blood sugar rechecked and states that A1c was very high.  Last A1c was > 14 on 06/26/2021  Objective: AAO x3, NAD DP/PT pulses palpable bilaterally, CRT less than 3 seconds Hyperkeratotic tissue present left medial second toe, left fifth MPJ, left submetatarsal 2, and first interspace right foot.  There is no underlying ulceration drainage or any signs of infection noted today.   There is continued mild tenderness palpation on the anterior lateral aspect of the right ankle.  Discomfort endrange motion.  There is no area of pinpoint tenderness.  No edema, erythema.  No other areas of discomfort. No pain with calf compression, swelling, warmth, erythema.    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 -Debride the hyperkeratotic lesions any complications or bleeding as a courtesy. -I reviewed the MRI with her that shows significant arthritis of the ankle joint.  She has a history of a triple arthrodesis.  Discussed with her she would need to have an ankle joint fusion however her A1c is severely elevated and she is not a surgical candidate at this point.  Continue bracing for now.   Return in about 4 weeks (around 07/30/2021) for arthitis; foot pain.  [name] DPM

## 2021-07-03 ENCOUNTER — Encounter: Payer: Self-pay | Admitting: Internal Medicine

## 2021-07-03 ENCOUNTER — Ambulatory Visit (INDEPENDENT_AMBULATORY_CARE_PROVIDER_SITE_OTHER): Payer: Medicaid Other | Admitting: Internal Medicine

## 2021-07-03 ENCOUNTER — Ambulatory Visit: Payer: Medicaid Other | Admitting: Dietician

## 2021-07-03 ENCOUNTER — Other Ambulatory Visit (HOSPITAL_COMMUNITY): Payer: Self-pay

## 2021-07-03 ENCOUNTER — Other Ambulatory Visit: Payer: Self-pay | Admitting: Obstetrics and Gynecology

## 2021-07-03 ENCOUNTER — Encounter: Payer: Self-pay | Admitting: Dietician

## 2021-07-03 ENCOUNTER — Other Ambulatory Visit: Payer: Self-pay

## 2021-07-03 VITALS — BP 130/60 | HR 79 | Temp 99.4°F | Ht 73.0 in | Wt 253.3 lb

## 2021-07-03 DIAGNOSIS — E1165 Type 2 diabetes mellitus with hyperglycemia: Secondary | ICD-10-CM | POA: Diagnosis present

## 2021-07-03 MED ORDER — TRULICITY 3 MG/0.5ML ~~LOC~~ SOAJ
3.0000 mg | SUBCUTANEOUS | 2 refills | Status: DC
  Filled 2021-07-03: qty 2, 28d supply, fill #0

## 2021-07-03 MED ORDER — EMPAGLIFLOZIN 10 MG PO TABS
10.0000 mg | ORAL_TABLET | Freq: Every day | ORAL | 1 refills | Status: DC
Start: 2021-07-03 — End: 2022-05-12
  Filled 2021-07-03 – 2021-10-17 (×4): qty 90, 90d supply, fill #0
  Filled 2022-01-22: qty 90, 90d supply, fill #1

## 2021-07-03 MED ORDER — NOVOLOG MIX 70/30 FLEXPEN (70-30) 100 UNIT/ML ~~LOC~~ SUPN
35.0000 [IU] | PEN_INJECTOR | Freq: Two times a day (BID) | SUBCUTANEOUS | 1 refills | Status: DC
  Filled 2021-07-03: qty 30, 43d supply, fill #0

## 2021-07-03 MED ORDER — NOVOLOG MIX 70/30 FLEXPEN (70-30) 100 UNIT/ML ~~LOC~~ SUPN
25.0000 [IU] | PEN_INJECTOR | Freq: Two times a day (BID) | SUBCUTANEOUS | 1 refills | Status: DC
  Filled 2021-07-03: qty 30, 60d supply, fill #0
  Filled 2021-09-06: qty 30, 60d supply, fill #1

## 2021-07-03 NOTE — Patient Outreach (Signed)
Care Coordination  07/03/2021  Sylva Rusher Nott 08/30/1971 [id number]  Muntaz Pallan [place] was referred to the [facility] Managed Care High Risk team for assistance with care coordination and care management services. Care coordination/care management services as part of the Medicaid benefit was offered to the patient today. The patient declined assistance offered today.   Plan: The Medicaid Managed Care High Risk team is available at any time in the future to assist with care coordination/care management services upon referral.   Aida Raider RN, [place] Management Coordinator - Managed [place] High Risk [phone].

## 2021-07-03 NOTE — Progress Notes (Signed)
Diabetes Self-Management Education  Visit Type: First/Initial  Appt. Start Time: 930 Appt. End Time: 950  07/03/2021  Tina [name], identified by name and date of birth, is a 50 y.o. female with a diagnosis of Diabetes: Type 2.   ASSESSMENT  Reviewed her 6 day CGM reports with her. She reports taking her Novolog 25 units once a day around noon. Her blood sugars are much higher during the day when she is eating, but are are also high overnight. Agree with splitting insulin into two doses a day. Patient also plans to cut back on sugary and starchy foods. I suspect she will need larger dose of insulin before breakfast and smaller dose before dinner until/unlessTrulicity dose can be increased to decrease prandial rise.   Lab Results  Component Value Date   HGBA1C >14.0 (A) 06/26/2021   HGBA1C 7.1 (A) 12/05/2020   HGBA1C >14.0 (A) 07/30/2020   HGBA1C 12.9 (A) 10/10/2019   HGBA1C >14.0 (A) 05/09/2019     Estimated body mass index is 33.42 kg/m as calculated from the following:   Height as of an earlier encounter on 07/03/21: '6\' 1"'$  (1.854 m).   Weight as of an earlier encounter on 07/03/21: 253 lb 4.8 oz (114.9 kg).  BP Readings from Last 3 Encounters:  07/03/21 130/60  06/27/21 (!) 145/79  06/26/21 134/76      Diabetes Self-Management Education - 07/03/21 1100       Visit Information   Visit Type First/Initial      Initial Visit   Diabetes Type Type 2    Are you currently following a meal plan? No    Are you taking your medications as prescribed? Yes      Psychosocial Assessment   Patient Belief/Attitude about Diabetes Motivated to manage diabetes    Self-care barriers Unable to determine;Lack of material resources    Self-management support Doctor's office;CDE visits    Patient Concerns Glycemic Control    Special Needs Unable to determine    Preferred Learning Style No preference indicated    Learning Readiness Ready    How often do you need to have someone  help you when you read instructions, pamphlets, or other written materials from your doctor or pharmacy? 2 - Rarely    What is the last grade level you completed in school? 12      Pre-Education Assessment   Patient understands the diabetes disease and treatment process. Demonstrates understanding / competency    Patient understands incorporating nutritional management into lifestyle. Needs Review    Patient undertands incorporating physical activity into lifestyle. Needs Review    Patient understands using medications safely. Needs Review    Patient understands monitoring blood glucose, interpreting and using results Needs Review    Patient understands prevention, detection, and treatment of acute complications. Needs Review    Patient understands prevention, detection, and treatment of chronic complications. Demonstrates understanding / competency    Patient understands how to develop strategies to address psychosocial issues. Demonstrates understanding / competency    Patient understands how to develop strategies to promote health/change behavior. Demonstrates understanding / competency      Complications   Last HgB A1C per patient/outside source 14 %    How often do you check your blood sugar? 0 times/day (not testing)   cannot find her meter   Fasting Blood glucose range (mg/dL) >200;180-200    Postprandial Blood glucose range (mg/dL) >200    Number of hypoglycemic episodes per month 0  Number of hyperglycemic episodes per week 80    Can you tell when your blood sugar is high? No             Individualized Plan for Diabetes Self-Management Training:   Learning Objective:  Patient will have a greater understanding of diabetes self-management. Patient education plan is to attend individual and/or group sessions per assessed needs and concerns.   Plan:   There are no Patient Instructions on file for this visit.  Expected Outcomes:     Education material provided: Diabetes  Resources  If problems or questions, patient to contact team via:  Phone  Future DSME appointment:

## 2021-07-03 NOTE — Patient Instructions (Signed)
Please decrease portions of juice, pineapple an other larger portions of sugary and starchy foods.   Return in 1 week for sensor removal and second download of Continuous glucose monitoring  Thank you!  Butch Penny

## 2021-07-03 NOTE — Progress Notes (Signed)
   CC: Type 2 Diabetes Mellitus  HPI:[name] is a 50 y.o. female who presents for evaluation of Type 2 Diabetes Mellitus. Please see individual problem based A/P for details.  Past Medical History:  Diagnosis Date   Diabetes mellitus    type II   Fibroids    HTN (hypertension)    Hyperlipidemia    Iron deficiency anemia    Left acetabular fracture (HCC)    Lumbar strain    Obesity    Ovarian cyst    Review of Systems:   Review of Systems  Constitutional:  Negative for chills and fever.  Endo/Heme/Allergies:  Negative for polydipsia.       Polyuria    Physical Exam: Vitals:   07/03/21 0924  BP: 130/60  Pulse: 79  Temp: 99.4 F (37.4 C)  TempSrc: Oral  SpO2: 100%  Weight: 253 lb 4.8 oz (114.9 kg)  Height: '6\' 1"'$  (1.854 m)   General: NAD, obese HEENT: Conjunctiva nl , antiicteric sclerae, moist mucous membranes, no exudate or erythema Cardiovascular: Normal rate, regular rhythm.  No murmurs, rubs, or gallops Pulmonary : Equal breath sounds, No wheezes, rales, or rhonchi   Assessment & Plan:   See Encounters Tab for problem based charting.  Patient discussed with Dr. Evette Doffing

## 2021-07-03 NOTE — Patient Instructions (Addendum)
1. Uncontrolled type 2 diabetes mellitus with hyperglycemia (HCC)  - empagliflozin (JARDIANCE) 10 MG TABS tablet; Take 1 tablet (10 mg total) by mouth daily before breakfast.  Dispense: 90 tablet; Refill: 1 - insulin aspart protamine - aspart (NOVOLOG MIX 70/30 FLEXPEN) (70-30) 100 UNIT/ML FlexPen; Inject 25 Units into the skin 2 (two) times daily.  Dispense: 30 mL; Refill: 1     Please return in 7 days with [place] for Download #2.

## 2021-07-04 ENCOUNTER — Encounter: Payer: Self-pay | Admitting: Internal Medicine

## 2021-07-04 ENCOUNTER — Other Ambulatory Visit (HOSPITAL_COMMUNITY): Payer: Self-pay

## 2021-07-04 NOTE — Assessment & Plan Note (Signed)
Tina [name] wore the CGM for 7 days. The average reading was 258, % time in target was 4, % time below target was 0, and % time above target was 96. Intervention will be to swith patient to 70/30 Insulin  and restart Jardiance. On further discussion with attending and lab, it is possible patient had lab error and was not going into DKA. She was asymptomatic and the next day her labs were in normal limits. The patient will be scheduled to see Butch Penny and [facility] for a final appointment in 7 days.   Assessment/Plan:  - Metformin 1000 mg twice daily - Trulicity 1.5 once weekly  - empagliflozin (JARDIANCE) 10 MG TABS tablet; Take 1 tablet (10 mg total) by mouth daily before breakfast.  Dispense: 90 tablet; Refill: 1 - insulin aspart protamine - aspart (NOVOLOG MIX 70/30 FLEXPEN) (70-30) 100 UNIT/ML FlexPen; Inject 25 Units into the skin 2 (two) times daily.  Dispense: 30 mL; Refill: 1

## 2021-07-05 ENCOUNTER — Other Ambulatory Visit: Payer: Self-pay

## 2021-07-05 ENCOUNTER — Other Ambulatory Visit: Payer: Self-pay | Admitting: Internal Medicine

## 2021-07-05 DIAGNOSIS — Z79899 Other long term (current) drug therapy: Secondary | ICD-10-CM

## 2021-07-05 NOTE — Progress Notes (Signed)
Patient has multiple medications and multiple changes made recently. Will ask for medication review with clinical pharmacist.

## 2021-07-05 NOTE — Patient Instructions (Signed)
Visit Information  [name] was given information about Medicaid Managed Care team care coordination services as a part of their [place] Complete Medicaid benefit. Tina [name] verbally consented to engagement with the [facility] Managed Care team.   If you are experiencing a medical emergency, please call 911 or report to your local emergency department or urgent care.   If you have a non-emergency medical problem during routine business hours, please contact your provider's office and ask to speak with a nurse.   For questions related to your [place] Complete Medicaid health plan, please call: [phone]  If you would like to schedule transportation through your [place] Complete Medicaid plan, please call the following number at least 2 days in advance of your appointment: [phone].   Call the [facility] at [phone], at any time, 24 hours a day, 7 days a week. If you are in danger or need immediate medical attention call 911.  If you would like help to quit smoking, call 1-800-QUIT-NOW 864-794-4260) OR Espaol: 1-855-Djelo-Ya QO:409462) o para ms informacin haga clic aqu or Text READY to 200-400 to register via text  Tina [name] - following are the goals we discussed in your visit today:   Goals Addressed   None     Please see education materials related to DM provided as print materials.   The patient verbalized understanding of instructions provided today and declined a print copy of patient instruction materials.   The  Patient                                              has been provided with contact information for the Managed Medicaid care management team and has been advised to call with any health related questions or concerns.   [place] Constable, Pharm.D., Managed Medicaid Pharmacist [phone]   Following is a copy of your plan of care:  Patient Care Plan: Medication Management     Problem  Identified: Health Promotion or Disease Self-Management (General Plan of Care) Resolved 07/03/2021  Priority: High  Onset Date: 12/27/2020  Note:   Medicaid Managed Care (see longitudinal plan of care for additional care plan information)  Current Barriers:  Chronic Disease Management support and education needs related to DM, HTN, ankle pain, anxiety/depression, smoking. 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 Update 01/24/21:  patient states she has Medicaid now. Update 07/03/21:  Patient declined services.  Nurse Case Manager Clinical Goal(s):  Over the next 30 days, patient will demonstrate improved adherence to prescribed treatment plan for DM as evidenced by checking blood sugars with glucometer and continuing medications as prescribed.  Update:  Patient states she is not checking her blood sugars-cannot fine glucometer.  Encouraged patient to follow up with her provider and/or purchase new one at [facility].  Patient states she can do this. Update 10/11/20:  Patient states she will purchase glucometer next week. Update 12/27/20:  Patient states she is not checking her blood sugars.  Patient's Hgb A1C=7.1% Update 02/18/21:  Patient checking blood sugars twice a day. Over the next 30 days, patient will consider smoking cessation techniques. Update 03/18/21:  Patient smoking 1/2 ppd-not interested in stopping right now.  Interventions:  Inter-disciplinary care team collaboration (see longitudinal plan of care) Advised patient to check blood sugars as recommended by provider. Update 02/18/21:  Patient  checking blood sugars twice a day. Provided education to patient re: importance of checking blood sugars. Discussed plans with patient for ongoing care management follow up and provided patient with direct contact information for care management team Patient will check blood pressure as directed. Update 03/20/21:  B/P = 136/62 per  patient. Provided patient with 1-800-QUIT Now phone number.  Will contact patient's PCP regarding possible prescription.  Patient does not want to use patches or Chantix. Update 02/18/21:  Patient not currently checking blood sugars. Update 12/27/20:  Patient to follow up with provider regarding ankle pain. Update 01/24/21:  Patient seen and evaluated for ankle pain, currently wearing brace and taking Ibuprofen. Update 02/18/21:  Patient continues to wear ankle brace-has follow up appointment with provider 02/19/21. Update 03/20/21:  Continues to wear ankle brace-not much improvement-has follow up appointment with Dr. Jacqualyn Posey.  Plan:  Patient will check blood sugars with glucometer.  Patient will check blood pressure.   Patient will continue behavioral health therapy. Update 01/24/21:  Patient's Mother recently died.  Patient has appointment with therapist 01/29/21. Update 02/18/21:  Patient continues therapy services. Update 03/20/21:  Patient continues therapy services which she finds helpful-medications adjusted by provider. RNCM will follow up with patient within 30 days.     Goal: Medication Management Completed 07/03/2021  Note:   Current Barriers:  Unable to independently afford treatment regimen Does not contact provider office for questions/concerns   Pharmacist Clinical Goal(s):  Over the next 90 days, patient will maintain control of DM as evidenced by Current A1c  contact provider office for questions/concerns as evidenced notation of same in electronic health record through collaboration with PharmD and provider.    Interventions: Inter-disciplinary care team collaboration (see longitudinal plan of care) Comprehensive medication review performed; medication list updated in electronic medical record  '@RXCPDIABETES'$ @ '@RXCPHYPERTENSION'$ @ '@RXCPHYPERLIPIDEMIA'$ @  Patient Goals/Self-Care Activities Over the next 90 days, patient will:  - take medications as prescribed check glucose  Daily, document, and provide at future appointments collaborate with provider on medication access solutions  Follow Up Plan: The care management team will reach out to the patient again over the next 90 days.      Task: Mutually Develop and Royce Macadamia Achievement of Patient Goals Completed 07/03/2021  Note:   Care Management Activities:    - verbalization of feelings encouraged    Notes:

## 2021-07-05 NOTE — Progress Notes (Signed)
Internal Medicine Clinic Attending  Case discussed with Dr. Steen  At the time of the visit.  We reviewed the resident's history and exam and pertinent patient test results.  I agree with the assessment, diagnosis, and plan of care documented in the resident's note.  

## 2021-07-05 NOTE — Patient Outreach (Addendum)
Medicaid Managed Care    Pharmacy Note  07/05/2021 Name: Tina [name] MRN: [id number] DOB: 1971-04-11  Tina [name] is a 50 y.o. year old female who is a primary care patient of Tina Rob, MD. The [facility] Managed Care Coordination team was consulted for assistance with disease management and care coordination needs.    Engaged with patient Engaged with patient by telephone for follow up visit in response to referral for case management and/or care coordination services.  Tina [name] was given information about Managed Medicaid Care Coordination team services today. Tina [name] agreed to services and verbal consent obtained.   Objective:  Lab Results  Component Value Date   CREATININE 0.60 06/27/2021   CREATININE 0.75 06/26/2021   CREATININE 0.79 12/05/2020    Lab Results  Component Value Date   HGBA1C >14.0 (A) 06/26/2021       Component Value Date/Time   CHOL 154 08/08/2020 0945   TRIG 142 08/08/2020 0945   HDL 35 (L) 08/08/2020 0945   CHOLHDL 4.4 08/08/2020 0945   CHOLHDL 7.9 08/21/2014 1512   VLDL 50 (H) 08/21/2014 1512   LDLCALC 94 08/08/2020 0945    Other: (TSH, CBC, Vit D, etc.)  Clinical ASCVD: No  The ASCVD Risk score Mikey Bussing DC Jr., et al., 2013) failed to calculate for the following reasons:   Unable to determine if patient is Non-Hispanic African American    Other: (CHADS2VASc if Afib, PHQ9 if depression, MMRC or CAT for COPD, ACT, DEXA)  BP Readings from Last 3 Encounters:  07/03/21 130/60  06/27/21 (!) 145/79  06/26/21 134/76    Assessment/Interventions: Review of patient past medical history, allergies, medications, health status, including review of consultants reports, laboratory and other test data, was performed as part of comprehensive evaluation and provision of chronic care management services.   Meds: -I am unsure what patient is taking and patient wasn't sure either. Per Fill  Hx  Aripiprazole 78m HS (02/21/21 for 30 days) Buspirone 147mTID (05/14/21 for 30 days) Lamotrigine 10084m4/21/22 for 60 days) Sertraline 35m67m (05/14/21 for 90 days)  Lipids Atorvastatin 40mg15m1/22 for 30 days)  DM Aspart 70/30 take 25 units BID (07/04/21 for 60 days) Glargine (06/26/21 for 42 days) Dulaglutide 1.5mg (64m4/22 for 28 days) Canagliflozin/Metformin 135mg/136mg BID (06/03/21 for 30 days) Empagliflozin 10mg (820m22 for 90 days) Metformin 1000mg (7/24m2 for 30 days)  HTN Enalapril 20mg (5/53mfor 90 days)  Insomnia Trazodone 300mg (4/2117mfor 30 days) Zolpidem 5mg (5/19/260mor 15 days)  Per Meds list on Epic (Confirmed on 07/04/21) Aripiprazole 5mg Buspiron73m5mg TID Lamo39mine 100mg QD Sertra71m 35mg  Lipids At86mstatin 40mg  DM Empagli78min 10mg QD Aspart 7070mMetformin 1000mg BID  Insomnia71mpidem 5mg Trazodone 300mg24m  And per not21m 07/03/21  DM Dulaglutide  Empagliflozin Metformin 1000mg BID Lantus 35mg 1mn: Reached ou23m Dr. Steen via direct msg, wCourt Joyefer on how to proceed to him. MSG confirmed as "Read" by PCP   SDOH (Social Determinants of Health) assessments and interventions performed:    Care Plan  Allergies  Allergen Reactions   Codeine Other (See Comments)    hallucinations    Medications Reviewed Today     Reviewed by Steen, James, MD (ResidMadalyn Rob1/22 at 1814  Med List Status: [place] StationMedication Order Taking? Sig Documenting Provider Last Dose Status Informant  ARIPiprazole (ABILIFY) 5 MG tablet [id number]  Take 1 table545625638otal) by mouth daily. Parsons, Brittney E,Salley Slaughter  NP  Active   atorvastatin (LIPITOR) 40 MG tablet [id number]  Take 1 tablet (40 mg total) by mouth daily. Tina Rob, MD  Active   Blood Glucose Monitoring Suppl (CONTOUR NEXT ONE) KIT [id number] No 1 each by Does not apply route See admin instructions. USE TO CHECK BLOOD GLUCOSE ONCE DAILY. IM PROGRAM. [name], MD Taking  Active   busPIRone (BUSPAR) 15 MG tablet [id number]  Take 1 tablet (15 mg total) by mouth 3 (three) times daily. Eulis Canner E, NP  Active   diclofenac Sodium (VOLTAREN) 1 % GEL [id number]  Apply 2 g topically 4 (four) times daily. Rub into affected area of foot 2 to 4 times daily [name], DPM  Active   empagliflozin (JARDIANCE) 10 MG TABS tablet [id number] Yes Take 1 tablet (10 mg total) by mouth daily before breakfast. Tina Rob, MD  Active   enalapril (VASOTEC) 20 MG tablet [id number] No TAKE 1 TABLET (20 MG TOTAL) BY MOUTH DAILY. Andrew Au, MD Taking Active   fluconazole (DIFLUCAN) 150 MG tablet [id number]  Take 1 tablet (150 mg total) by mouth daily. Tina Rob, MD  Active   gabapentin (NEURONTIN) 300 MG capsule [id number] No Take 1 capsule (300 mg total) by mouth 3 (three) times daily for 3 days, THEN 2 capsules (600 mg total) 3 (three) times daily. Eulis Canner E, NP Taking Expired 03/21/21 2359   gabapentin (NEURONTIN) 300 MG capsule [id number]  TAKE 1 CAPSULE BY MOUTH 3 TIMES DAILY FOR 3 DAYS, THEN 2 CAPSULES BY MOUTH 3 TIMES DAILY. Eulis Canner E, NP  Active   glucose blood (CONTOUR NEXT TEST) test strip [id number] No Please use to check your sugar 3 times a day. Welford Roche, MD Taking Active   hydrOXYzine (ATARAX/VISTARIL) 50 MG tablet [id number]  Take 1 tablet (50 mg total) by mouth 3 (three) times daily. Salley Slaughter, NP  Active   ibuprofen (ADVIL) 800 MG tablet [id number] No Take 1 tablet (800 mg total) by mouth every 8 (eight) hours as needed. [name], DPM Taking Active   insulin aspart protamine - aspart (NOVOLOG MIX 70/30 FLEXPEN) (70-30) 100 UNIT/ML FlexPen [id number]  Inject 25 Units into the skin 2 (two) times daily. Tina Rob, MD  Active   Insulin Pen Needle 32G X 4 MM MISC [id number]  Use daily at 6pm Tina Rob, MD  Active   lamoTRIgine (LAMICTAL) 100 MG tablet [id number]  Take 1 tablet (100 mg total) by mouth daily.  Salley Slaughter, NP  Active   Lancets MISC [id number] No 1 Units by Does not apply route 3 (three) times daily. Welford Roche, MD Taking Active   metFORMIN (GLUCOPHAGE) 1000 MG tablet [id number]  Take 1 tablet (1,000 mg total) by mouth 2 (two) times daily with a meal. Tina Rob, MD  Active   Multiple Vitamins-Minerals (MULTIVITAMIN WITH MINERALS) tablet [id number] No Take 1 tablet by mouth daily. [provider] Taking Active   sertraline (ZOLOFT) 50 MG tablet [id number]  Take 1 tablet (50 mg total) by mouth daily. Salley Slaughter, NP  Active   trazodone (DESYREL) 300 MG tablet [id number]  Take 1 tablet (300 mg total) by mouth at bedtime as needed for sleep. Salley Slaughter, NP  Active   zolpidem (AMBIEN) 5 MG tablet [id number]  Take 1 tablet (5 mg total) by mouth at bedtime as needed for sleep. Salley Slaughter, NP  Active  Patient Active Problem List   Diagnosis Date Noted   Insomnia due to other mental disorder 03/18/2021   Generalized anxiety disorder 11/12/2020   Severe recurrent major depression with psychotic features ([place]) 10/04/2020   Bipolar 2 disorder, major depressive episode ([place]) 10/04/2020   Pain in both feet 10/10/2019   Fibroids 04/14/2019   Financial difficulties 04/13/2018   Seasonal allergies 02/06/2017   Osteoarthritis of right ankle 08/29/2016   Tobacco abuse 01/21/2016   Severe obesity (BMI 35.0-39.9) 10/12/2015   Foot ulcer ([place]) 09/13/2015   Ovarian cyst 12/24/2012   Right knee pain 12/09/2012   Hypertension 07/20/2012   Healthcare maintenance 06/27/2011   Uncontrolled type 2 diabetes mellitus ([place]) 05/30/2010   Dyslipidemia 05/30/2010   Depression 05/30/2010   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    Conditions to be addressed/monitored: HTN, HLD and DM  Patient Care Plan: Medication Management     Problem Identified: Health Promotion or Disease Self-Management (General Plan of Care)       Goal: Medication Management   Note:   Current Barriers:  Unable to independently afford treatment regimen Does not contact provider office for questions/concerns   Pharmacist Clinical Goal(s):  Over the next 90 days, patient will maintain control of DM as evidenced by Current A1c  contact provider office for questions/concerns as evidenced notation of same in electronic health record through collaboration with PharmD and provider.    Interventions: Inter-disciplinary care team collaboration (see longitudinal plan of care) Comprehensive medication review performed; medication list updated in electronic medical record    Patient Goals/Self-Care Activities Over the next 90 days, patient will:  - take medications as prescribed check glucose Daily, document, and provide at future appointments collaborate with provider on medication access solutions  Follow Up Plan: The care management team will reach out to the patient again over the next 90 days.      Task: Mutually Develop and Royce Macadamia Achievement of Patient Goals   Note:   Care Management Activities:    - verbalization of feelings encouraged    Notes:      Medication Assistance: None required. Patient affirms current coverage meets needs.   Follow up: Agree   Plan: The care management team will reach out to the patient again over the next 90 days.   [place] Constable, Pharm.D., Managed Medicaid Pharmacist - [phone]

## 2021-07-10 ENCOUNTER — Encounter: Payer: Medicaid Other | Admitting: Dietician

## 2021-07-10 ENCOUNTER — Other Ambulatory Visit: Payer: Self-pay

## 2021-07-10 ENCOUNTER — Ambulatory Visit (INDEPENDENT_AMBULATORY_CARE_PROVIDER_SITE_OTHER): Payer: Medicaid Other | Admitting: Internal Medicine

## 2021-07-10 ENCOUNTER — Encounter: Payer: Self-pay | Admitting: Dietician

## 2021-07-10 ENCOUNTER — Ambulatory Visit (INDEPENDENT_AMBULATORY_CARE_PROVIDER_SITE_OTHER): Payer: Medicaid Other | Admitting: Dietician

## 2021-07-10 ENCOUNTER — Encounter: Payer: Self-pay | Admitting: Internal Medicine

## 2021-07-10 VITALS — BP 132/59 | HR 74 | Temp 98.5°F | Ht 76.0 in | Wt 257.2 lb

## 2021-07-10 DIAGNOSIS — E1165 Type 2 diabetes mellitus with hyperglycemia: Secondary | ICD-10-CM

## 2021-07-10 DIAGNOSIS — E1169 Type 2 diabetes mellitus with other specified complication: Secondary | ICD-10-CM

## 2021-07-10 LAB — GLUCOSE, CAPILLARY: Glucose-Capillary: 90 mg/dL (ref 70–99)

## 2021-07-10 NOTE — Patient Instructions (Signed)
Ms. Veksler,  Thank you for your visit today!  We talked about:   Checking blood sugars 3-4 times a day before taking insulin and before lunch and bedtime.   Keep eating smaller portions of pineapple and pineapple juice. This has helped a lot!  It is best to take Novolog Mix 70/30 insulin 15-20 minutes BEFORE MEALS.   I made a follow up for 3 months to check to see how your diabetes is doing and if you want to repeat Continuous glucose monitoring  Butch Penny [phone]

## 2021-07-10 NOTE — Progress Notes (Deleted)
   Office Visit   [name], female    DOB: [birth year], 50 y.o.   MRN: [id number]   PCP: [name], MD   Subjective:  Tina [name] is a 50 y.o. year old female with uncontrolled diabetes who presents for CGM follow up. Please refer to problem based charting for assessment and plan.    Objective:   There were no vitals taken for this visit. BP Readings from Last 3 Encounters:  07/03/21 130/60  06/27/21 (!) 145/79  06/26/21 134/76    Assessment & Plan:   Problem List Items Addressed This Visit   None    No follow-ups on file.   Pt discussed with ***  Mitzi Hansen, MD Internal Medicine Resident PGY-3 [facility] Pager: [phone] 07/10/2021 6:09 AM

## 2021-07-10 NOTE — Progress Notes (Signed)
Diabetes Self-Management Education  Visit Type:    Appt. Start Time: 900 Appt. End Time: [id number]  07/10/2021  [name], identified by name and date of birth, is a 50 y.o. female with a diagnosis of Diabetes:  .   ASSESSMENT  CGM sensor was removed and downloaded today. A 14 day report was provided to patient and Dr. Darrick Meigs.   Height '6\' 4"'$  (1.93 m), weight 257 lb 3.2 oz (116.7 kg). Body mass index is 31.31 kg/m. Wt Readings from Last 5 Encounters:  07/10/21 257 lb 3.2 oz (116.7 kg)  07/03/21 253 lb 4.8 oz (114.9 kg)  06/27/21 247 lb (112 kg)  06/26/21 247 lb 9.6 oz (112.3 kg)  12/05/20 267 lb 12.8 oz (121.5 kg)    Lab Results  Component Value Date   HGBA1C >14.0 (A) 06/26/2021   HGBA1C 7.1 (A) 12/05/2020   HGBA1C >14.0 (A) 07/30/2020   HGBA1C 12.9 (A) 10/10/2019   HGBA1C >14.0 (A) 05/09/2019    UBW- 247# a few weeks ago.  Now on 70/30 25 units morning after breakfast and after evening meal. she plans to begin to take it 15 minutes before meals- it will work better/more efficiently when she does this. Her blood sugars the past week have been very well controlled with one low after midnight and this am she felt low after taking her insulin and "not eating any breakfast which is her usual. ( Not sure if she had her usual pineapple juice today which has been enough carbs the past week when taking her insulin after eating...) anyway, I suggest lowering her to 20 units before her meals to prevent hypos.     she is a good candidate for personal CGM, but [place] Medicaid says to qualify they must be on injections per day. I gave her a meter today because she cannot find hers.   CGM Results from download:      Average glucose:   187 mg/dL for 14 days  Glucose management indicator:   7.8 %  Time in range (70-180 mg/dL):   52 %   (Goal >70%)  Time High (181-250 mg/dL):   24 %   (Goal < 25%)  Time Very High (>250 mg/dL):    24 %   (Goal < 5%)  Time Low (54-69 mg/dL):   0 %    (Goal <4%)  Time Very Low (<54 mg/dL):   0 %   (Goal <1%)  Coefficient of variation:   43.5 %   (Goal <36%)    Coefficient of variation is elevated because week 1 blood sugar were > 180 100% and week 2 were < 180 100%.    Individualized Plan for Diabetes Self-Management Training:   Learning Objective:  Patient will have a greater understanding of diabetes self-management. Patient education plan is to attend individual and/or group sessions per assessed needs and concerns.      Diabetes Self-Management Education - 07/10/21 1000       Health Coping   How would you rate your overall health? Good      Exercise   Exercise Type ADL's;Light (walking / raking leaves)    How many days per week to you exercise? 25    How many minutes per day do you exercise? 7    Total minutes per week of exercise 175      Patient Education   Previous Diabetes Education Yes (please comment)    Physical activity and exercise  Helped patient identify appropriate exercises in  relation to his/her diabetes, diabetes complications and other health issue.    Medications Reviewed patients medication for diabetes, action, purpose, timing of dose and side effects.    Acute complications Taught treatment of hypoglycemia - the 15 rule.      Patient Self-Evaluation of Goals - Patient rates self as meeting previously set goals (% of time)   Nutrition >75%   stopped eating pineapple and cut down on juice   Monitoring < 25%   change this to test my blood sugar     Outcomes   Program Status Not Completed      Subsequent Visit   Since your last visit have you continued or begun to take your medications as prescribed? Yes    Since your last visit have you had your blood pressure checked? No    Since your last visit have you experienced any weight changes? Gain    Weight Gain (lbs) 10    Since your last visit, are you checking your blood glucose at least once a day? No   cannot find meter- new sample meter given to pt  today           Plan:   Check blood sugars 3-4 times a day before taking insulin and before lunch and bedtime.   Keep eating smaller portions of pineapple and pineapple juice. This has helped a lot!  It is best to take Novolog Mix 70/30 insulin 15-20 minutes BEFORE MEALS.   I made a follow up for 3 months to check to see how your diabetes is doing and if you want to repeat Continuous glucose monitoring   Expected Outcomes:   improved self management and glycemic control  Education material provided: Diabetes Resources  If problems or questions, patient to contact team via:  Phone  Future DSME appointment:  3 months Debera Lat, RD 07/10/2021 10:11 AM.

## 2021-07-11 NOTE — Progress Notes (Signed)
Pt presented for her CGM review. Appt time 10:45AM. She met with donna prior to our appointment time however left at 10:45, saying she couldn't wait.

## 2021-07-12 ENCOUNTER — Encounter (HOSPITAL_COMMUNITY): Payer: Self-pay | Admitting: Psychiatry

## 2021-07-12 ENCOUNTER — Other Ambulatory Visit: Payer: Self-pay | Admitting: Student

## 2021-07-12 ENCOUNTER — Other Ambulatory Visit (HOSPITAL_COMMUNITY): Payer: Self-pay

## 2021-07-12 ENCOUNTER — Telehealth (INDEPENDENT_AMBULATORY_CARE_PROVIDER_SITE_OTHER): Payer: Medicaid Other | Admitting: Psychiatry

## 2021-07-12 ENCOUNTER — Other Ambulatory Visit: Payer: Self-pay

## 2021-07-12 DIAGNOSIS — F411 Generalized anxiety disorder: Secondary | ICD-10-CM

## 2021-07-12 DIAGNOSIS — F3181 Bipolar II disorder: Secondary | ICD-10-CM | POA: Diagnosis not present

## 2021-07-12 MED ORDER — BELSOMRA 10 MG PO TABS
10.0000 mg | ORAL_TABLET | Freq: Every evening | ORAL | 2 refills | Status: DC | PRN
  Filled 2021-07-12 – 2021-07-26 (×5): qty 30, 30d supply, fill #0

## 2021-07-12 MED ORDER — ARIPIPRAZOLE 5 MG PO TABS
5.0000 mg | ORAL_TABLET | Freq: Every day | ORAL | 3 refills | Status: DC
Start: 2021-07-12 — End: 2021-10-11
  Filled 2021-07-12: qty 30, 30d supply, fill #0

## 2021-07-12 MED ORDER — BUSPIRONE HCL 15 MG PO TABS
15.0000 mg | ORAL_TABLET | Freq: Three times a day (TID) | ORAL | 3 refills | Status: DC
  Filled 2021-07-12: qty 90, 30d supply, fill #0

## 2021-07-12 MED ORDER — SERTRALINE HCL 50 MG PO TABS
50.0000 mg | ORAL_TABLET | Freq: Every day | ORAL | 3 refills | Status: DC
Start: 2021-07-12 — End: 2021-10-11
  Filled 2021-07-12: qty 90, 90d supply, fill #0

## 2021-07-12 NOTE — Progress Notes (Signed)
[facility] MD/PA/NP OP Progress Note Virtual Visit via Telephone Note  I connected with Tina [name] on 07/12/21 at  9:00 AM EDT by telephone and verified that I am speaking with the correct person using two identifiers.  Location: Patient: home Provider: Clinic   I discussed the limitations, risks, security and privacy concerns of performing an evaluation and management service by telephone and the availability of in person appointments. I also discussed with the patient that there may be a patient responsible charge related to this service. The patient expressed understanding and agreed to proceed.   I provided 30 minutes of non-face-to-face time during this encounter.   07/12/2021 9:25 AM Tina [name]  MRN:  [id number]  Chief Complaint: "The Ambien makes me nauseous"  HPI: 50 year old female seen today for follow up psychiatric evaluation.   She has a psychiatric history of anxiety, depression, and bipolar 2 disorder.  She is currently managed on Lamictal 100 mg daily, trazodone 300 mg nightly, Zoloft 50 mg daily, Abilify 5 mg daily,Ambien 5 mg nightly, and BuSpar 15 mg 3 times daily.  Patient also takes gabapentin 300 mg 3 times daily which she received from her PCP.   She notes that she discontinued trazodone and Ambien because it was ineffective in managing her sleep. She notes that her medications are somewhat effective in managing her psychiatric conditions.    Today patient unable to log on virtually so the call was done over the phone. During assessment she was pleasant, cooperative, and engaged in conversation.  She informed provider that  Ambien made her nauseous so she discontinued it. She also notes that she discontinued Trazodone and has only been sleeping for 1-2 hours. She notes most days she is tired and lacks energy to things she enjoyed. She informed Probation officer that she is concerned for her nephew who and niece who resides in [place] who both maybe  suffering from substance use.   Provider conducted a GAD-7 and patient scored an 11, at her last visit she scored a 18.   A PHQ-9 was conducted on 9/7 and patient scored an 11, at her last visit she scored a 23.  She also endorses auditory and visual hallucinations. Patient notes that her appetite is poor and notes that she has lost 30 pounds (reporting that some of her weigh loss is attributed to changes in her diabetic medication). She denies SI/HI.  Patient informed writer that her ankle continues to be painful.  She informed provider that gabapentin and Voltaren gel has been ineffective.  She notes that her orthopedic doctor is recommending surgery after her blood sugars are better managed.  At this time patient does not want to restart Ambien or Trazodone. She is agreeable to starting Belsomera 10 mg to help manage sleep.  At this time she request that her other medications not be adjusted.  She will follow-up with provider in 3 months for further evaluation.  She will also follow-up with outpatient counseling for therapy.  No other concerns noted at this time.     Visit Diagnosis:    ICD-10-CM   1. Bipolar 2 disorder, major depressive episode (HCC)  F31.81 Suvorexant (BELSOMRA) 10 MG TABS    ARIPiprazole (ABILIFY) 5 MG tablet    2. Generalized anxiety disorder  F41.1 busPIRone (BUSPAR) 15 MG tablet    sertraline (ZOLOFT) 50 MG tablet      Past Psychiatric History: anxiety, depression, and bipolar 2 disorder Past Medical History:  Past Medical History:  Diagnosis  Date   Diabetes mellitus    type II   Fibroids    HTN (hypertension)    Hyperlipidemia    Iron deficiency anemia    Left acetabular fracture (HCC)    Lumbar strain    Obesity    Ovarian cyst     Past Surgical History:  Procedure Laterality Date   FOOT ARTHRODESIS, MODIFIED MCBRIDE     right foot bunion surgery and ankle surgery 1980s   ORIF ACETABULAR FRACTURE     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    Family  Psychiatric History: Niece Schizophrenia  Family History:  Family History  Problem Relation Age of Onset   Colon cancer Sister    Diabetes Mother    Diabetes Father    Diabetes Brother    Celiac disease Paternal Aunt     Social History:  Social History   Socioeconomic History   Marital status: Single    Spouse name: Not on file   Number of children: 0   Years of education: 12   Highest education level: Some college, no degree  Occupational History   Occupation: Engineer, materials: UNEMPLOYED    Employer: [employer]  Tobacco Use   Smoking status: Every Day    Packs/day: 0.50    Years: 23.00    Pack years: 11.50    Types: Cigarettes   Smokeless tobacco: Never   Tobacco comments:    1/2PPD  Vaping Use   Vaping Use: Never used  Substance and Sexual Activity   Alcohol use: Yes    Alcohol/week: 0.0 standard drinks    Comment: occ   Drug use: No   Sexual activity: Yes    Partners: Female    Birth control/protection: Injection    Comment: Depo  Other Topics Concern   Not on file  Social History Narrative   Homosexual; in a monogamous relationship w/ homosexual woman.   She is disabled.  She last worked in 2018 at [employer].    Right handed   One story home   Social Determinants of Health   Financial Resource Strain: High Risk   Difficulty of Paying Living Expenses: Hard  Food Insecurity: No Food Insecurity   Worried About Charity fundraiser in the Last Year: Never true   Ran Out of Food in the Last Year: Never true  Transportation Needs: No Transportation Needs   Lack of Transportation (Medical): No   Lack of Transportation (Non-Medical): No  Physical Activity: Inactive   Days of Exercise per Week: 0 days   Minutes of Exercise per Session: 0 min  Stress: Stress Concern Present   Feeling of Stress : Very much  Social Connections: Moderately Isolated   Frequency of Communication with Friends and Family: More than three times a week   Frequency of Social  Gatherings with Friends and Family: More than three times a week   Attends Religious Services: More than 4 times per year   Active Member of [employer] or Organizations: No   Attends Archivist Meetings: Never   Marital Status: Never married    Allergies:  Allergies  Allergen Reactions   Codeine Other (See Comments)    hallucinations    Metabolic Disorder Labs: Lab Results  Component Value Date   HGBA1C >14.0 (A) 06/26/2021   MPG 200 05/25/2017   MPG 237 02/26/2016   No results found for: PROLACTIN Lab Results  Component Value Date   CHOL 154 08/08/2020   TRIG 142  08/08/2020   HDL 35 (L) 08/08/2020   CHOLHDL 4.4 08/08/2020   VLDL 50 (H) 08/21/2014   LDLCALC 94 08/08/2020   LDLCALC 96 01/07/2019   Lab Results  Component Value Date   TSH 1.074 06/27/2011    Therapeutic Level Labs: No results found for: LITHIUM No results found for: VALPROATE No components found for:  CBMZ  Current Medications: Current Outpatient Medications  Medication Sig Dispense Refill   Suvorexant (BELSOMRA) 10 MG TABS Take 1 tablet (10 mg) by mouth at bedtime as needed. 30 tablet 2   ARIPiprazole (ABILIFY) 5 MG tablet Take 1 tablet (5 mg total) by mouth daily. 30 tablet 3   atorvastatin (LIPITOR) 40 MG tablet Take 1 tablet (40 mg total) by mouth daily. 30 tablet 11   Blood Glucose Monitoring Suppl (CONTOUR NEXT ONE) KIT 1 each by Does not apply route See admin instructions. USE TO CHECK BLOOD GLUCOSE ONCE DAILY. IM PROGRAM. 1 kit 0   busPIRone (BUSPAR) 15 MG tablet Take 1 tablet (15 mg total) by mouth 3 (three) times daily. 90 tablet 3   diclofenac Sodium (VOLTAREN) 1 % GEL Apply 2 g topically 4 (four) times daily. Rub into affected area of foot 2 to 4 times daily 100 g 2   Dulaglutide (TRULICITY) 1.5 BW/3.8LH SOPN Inject 1.5 mg into the skin as directed. 1.27m/week     empagliflozin (JARDIANCE) 10 MG TABS tablet Take 1 tablet (10 mg total) by mouth daily before breakfast. 90 tablet 1    enalapril (VASOTEC) 20 MG tablet TAKE 1 TABLET (20 MG TOTAL) BY MOUTH DAILY. 90 tablet 3   fluconazole (DIFLUCAN) 150 MG tablet Take 1 tablet (150 mg total) by mouth daily. 2 tablet 0   gabapentin (NEURONTIN) 300 MG capsule Take 1 capsule (300 mg total) by mouth 3 (three) times daily for 3 days, THEN 2 capsules (600 mg total) 3 (three) times daily. 549 capsule 0   gabapentin (NEURONTIN) 300 MG capsule TAKE 1 CAPSULE BY MOUTH 3 TIMES DAILY FOR 3 DAYS, THEN 2 CAPSULES BY MOUTH 3 TIMES DAILY. 549 capsule 0   glucose blood (CONTOUR NEXT TEST) test strip Please use to check your sugar 3 times a day. 100 strip 3   hydrOXYzine (ATARAX/VISTARIL) 50 MG tablet Take 1 tablet (50 mg total) by mouth 3 (three) times daily. 90 tablet 2   ibuprofen (ADVIL) 800 MG tablet Take 1 tablet (800 mg total) by mouth every 8 (eight) hours as needed. 30 tablet 0   insulin aspart protamine - aspart (NOVOLOG MIX 70/30 FLEXPEN) (70-30) 100 UNIT/ML FlexPen Inject 25 Units into the skin 2 (two) times daily. 30 mL 1   Insulin Pen Needle 32G X 4 MM MISC Use daily at 6pm 100 each 4   lamoTRIgine (LAMICTAL) 100 MG tablet Take 1 tablet (100 mg total) by mouth daily. 30 tablet 2   Lancets MISC 1 Units by Does not apply route 3 (three) times daily. 100 each 3   metFORMIN (GLUCOPHAGE) 1000 MG tablet Take 1 tablet (1,000 mg total) by mouth 2 (two) times daily with a meal. 180 tablet 1   Multiple Vitamins-Minerals (MULTIVITAMIN WITH MINERALS) tablet Take 1 tablet by mouth daily.     sertraline (ZOLOFT) 50 MG tablet Take 1 tablet (50 mg total) by mouth daily. 90 tablet 3   No current facility-administered medications for this visit.     Musculoskeletal: Strength & Muscle Tone:  Unable to assess due to telephone visit Gait & Station:  Unable to assess due to telephone visit Patient leans: N/A  Psychiatric Specialty Exam: Review of Systems  There were no vitals taken for this visit.There is no height or weight on file to calculate  BMI.  General Appearance:  Unable to assess due to telephone visit  Eye Contact:   Unable to assess due to telephone visit  Speech:  Clear and Coherent and Normal Rate  Volume:  Normal  Mood:  Anxious and Depressed  Affect:  Appropriate and Congruent  Thought Process:  Coherent, Goal Directed and Linear  Orientation:  Full (Time, Place, and Person)  Thought Content: Logical and Hallucinations: Auditory Visual   Suicidal Thoughts:  No  Homicidal Thoughts:  No  Memory:  Immediate;   Good Recent;   Good Remote;   Good  Judgement:  Good  Insight:  Good  Psychomotor Activity:  Normal  Concentration:  Concentration: Good and Attention Span: Good  Recall:  Good  Fund of Knowledge: Good  Language: Good  Akathisia:  No  Handed:  Right  AIMS (if indicated):Not done  Assets:  Communication Skills Desire for Improvement Financial Resources/Insurance Housing Social Support  ADL's:  Intact  Cognition: WNL  Sleep:  Poor   Screenings: GAD-7    Flowsheet Row Video Visit from 07/12/2021 in [facility] Video Visit from 03/18/2021 in [facility] Video Visit from 12/18/2020 in [facility] Video Visit from 11/12/2020 in [facility] Video Visit from 10/04/2020 in [facility]  Total GAD-7 Score '11 18 21 20 21      ' PHQ2-9    [place] Office Visit from 07/10/2021 in [place] Office Visit from 07/03/2021 in [place] Office Visit from 06/26/2021 in [place] Video Visit from 03/18/2021 in [facility] Video Visit from 12/18/2020 in [facility]  PHQ-2 Total Score '3 1 5 5 6  ' PHQ-9 Total Score '11 8 15 23 24      ' Flowsheet Row Video Visit from 03/18/2021 in [facility] Video Visit from 12/18/2020 in  [facility] Error: Q7 should not be populated when Q6 is No Error: Q2 is Yes, you must answer 3, 4, and 5        Assessment and Plan: Patient endorses symptoms of anxiety, depression, VAHand insomnia.  At this time patient does not want to restart Ambien or Trazodone. She is agreeable to starting Belsomera 10 mg to help manage sleep.  At this time she request that her other medications not be adjusted.   1. Bipolar 2 disorder, major depressive episode (HCC)  Start- Suvorexant (BELSOMRA) 10 MG TABS; Take 1 tablet (10 mg) by mouth at bedtime as needed.  Dispense: 30 tablet; Refill: 2 Continue- ARIPiprazole (ABILIFY) 5 MG tablet; Take 1 tablet (5 mg total) by mouth daily.  Dispense: 30 tablet; Refill: 3  2. Generalized anxiety disorder  Continue- busPIRone (BUSPAR) 15 MG tablet; Take 1 tablet (15 mg total) by mouth 3 (three) times daily.  Dispense: 90 tablet; Refill: 3 Continue- sertraline (ZOLOFT) 50 MG tablet; Take 1 tablet (50 mg total) by mouth daily.  Dispense: 90 tablet; Refill: 3     Follow up in 3 month  Follow up with therapy Salley Slaughter, NP 07/12/2021, 9:25 AM

## 2021-07-14 MED ORDER — ENALAPRIL MALEATE 20 MG PO TABS
20.0000 mg | ORAL_TABLET | Freq: Every day | ORAL | 2 refills | Status: DC
  Filled 2021-07-14: qty 90, 90d supply, fill #0

## 2021-07-15 ENCOUNTER — Other Ambulatory Visit (HOSPITAL_COMMUNITY): Payer: Self-pay

## 2021-07-15 ENCOUNTER — Encounter: Payer: Medicaid Other | Admitting: Internal Medicine

## 2021-07-17 ENCOUNTER — Other Ambulatory Visit (HOSPITAL_COMMUNITY): Payer: Self-pay

## 2021-07-18 ENCOUNTER — Other Ambulatory Visit (HOSPITAL_COMMUNITY): Payer: Self-pay

## 2021-07-19 ENCOUNTER — Other Ambulatory Visit (HOSPITAL_COMMUNITY): Payer: Self-pay

## 2021-07-22 ENCOUNTER — Other Ambulatory Visit (HOSPITAL_COMMUNITY): Payer: Self-pay

## 2021-07-22 ENCOUNTER — Other Ambulatory Visit: Payer: Self-pay | Admitting: Internal Medicine

## 2021-07-22 DIAGNOSIS — E1165 Type 2 diabetes mellitus with hyperglycemia: Secondary | ICD-10-CM

## 2021-07-22 NOTE — Telephone Encounter (Signed)
Pt with recent medication changes  Will send to pcp for review.Marland Kitchen

## 2021-07-23 ENCOUNTER — Other Ambulatory Visit (HOSPITAL_COMMUNITY): Payer: Self-pay

## 2021-07-23 MED ORDER — TRULICITY 1.5 MG/0.5ML ~~LOC~~ SOAJ
1.5000 mg | SUBCUTANEOUS | 2 refills | Status: DC
  Filled 2021-07-23: qty 2, 28d supply, fill #0
  Filled 2021-08-18: qty 2, 28d supply, fill #1
  Filled 2021-09-06: qty 2, 28d supply, fill #2

## 2021-07-23 MED FILL — Gabapentin Cap 300 MG: ORAL | 30 days supply | Qty: 180 | Fill #1 | Status: AC

## 2021-07-24 ENCOUNTER — Other Ambulatory Visit (HOSPITAL_COMMUNITY): Payer: Self-pay

## 2021-07-25 ENCOUNTER — Emergency Department (HOSPITAL_BASED_OUTPATIENT_CLINIC_OR_DEPARTMENT_OTHER): Payer: Medicaid Other

## 2021-07-25 ENCOUNTER — Emergency Department (HOSPITAL_BASED_OUTPATIENT_CLINIC_OR_DEPARTMENT_OTHER)
Admission: EM | Admit: 2021-07-25 | Discharge: 2021-07-25 | Disposition: A | Discharge status: Home / Self Care | Payer: Medicaid Other | Attending: Emergency Medicine | Admitting: Emergency Medicine

## 2021-07-25 ENCOUNTER — Encounter (HOSPITAL_BASED_OUTPATIENT_CLINIC_OR_DEPARTMENT_OTHER): Payer: Self-pay | Admitting: Emergency Medicine

## 2021-07-25 ENCOUNTER — Other Ambulatory Visit (HOSPITAL_COMMUNITY): Payer: Self-pay

## 2021-07-25 ENCOUNTER — Other Ambulatory Visit: Payer: Self-pay

## 2021-07-25 DIAGNOSIS — M7989 Other specified soft tissue disorders: Secondary | ICD-10-CM | POA: Insufficient documentation

## 2021-07-25 DIAGNOSIS — E1169 Type 2 diabetes mellitus with other specified complication: Secondary | ICD-10-CM | POA: Insufficient documentation

## 2021-07-25 DIAGNOSIS — M25562 Pain in left knee: Secondary | ICD-10-CM | POA: Diagnosis not present

## 2021-07-25 DIAGNOSIS — Z7984 Long term (current) use of oral hypoglycemic drugs: Secondary | ICD-10-CM | POA: Diagnosis not present

## 2021-07-25 DIAGNOSIS — Z794 Long term (current) use of insulin: Secondary | ICD-10-CM | POA: Insufficient documentation

## 2021-07-25 DIAGNOSIS — F17210 Nicotine dependence, cigarettes, uncomplicated: Secondary | ICD-10-CM | POA: Diagnosis not present

## 2021-07-25 DIAGNOSIS — I10 Essential (primary) hypertension: Secondary | ICD-10-CM | POA: Diagnosis not present

## 2021-07-25 DIAGNOSIS — Z79899 Other long term (current) drug therapy: Secondary | ICD-10-CM | POA: Diagnosis not present

## 2021-07-25 DIAGNOSIS — E785 Hyperlipidemia, unspecified: Secondary | ICD-10-CM | POA: Insufficient documentation

## 2021-07-25 MED ORDER — IBUPROFEN 800 MG PO TABS
800.0000 mg | ORAL_TABLET | Freq: Once | ORAL | Status: AC
  Administered 2021-07-25: 800 mg via ORAL
  Filled 2021-07-25: qty 1

## 2021-07-25 MED ORDER — IBUPROFEN 800 MG PO TABS
800.0000 mg | ORAL_TABLET | Freq: Three times a day (TID) | ORAL | 0 refills | Status: DC | PRN
  Filled 2021-07-25: qty 21, 7d supply, fill #0

## 2021-07-25 NOTE — ED Notes (Signed)
Discharge instructions discussed with pt. Pt verbalized understanding. Pt stable and ambulatory.  °

## 2021-07-25 NOTE — ED Provider Notes (Signed)
[facility] EMERGENCY DEPARTMENT Provider Note   CSN: [id number] Arrival date & time: 07/25/21  0745     History Chief Complaint  Patient presents with   Knee Pain    Tina [name] is a 50 y.o. female with history of diabetes, osteoarthritis of the right knee, presented ED with pain in her left knee.  Patient reports that she typically walks with a cane and favors her left leg because the right leg has bone-on-bone pain in the knee.  She reports that 3 to 4 days ago she developed gradual onset of pain in her left knee.  She feels left knee has swelling.  The pain is located on the lateral lower side of the left knee and also posteriorly.  Is worse with leg bending and bearing weight.  She has never had this pain before.  It is currently moderate to high intensity.  It does not radiate or travel down her legs.  She has not taken any medications at home, aside from 1 dose of ibuprofen.  No hemoptysis or asymmetric LE edema. Patient denies personal or family history of DVT or PE. No recent hormone use (including OCP); travel for >6 hours; prolonged immobilization for greater than 3 days; surgeries or trauma in the last 4 weeks; or malignancy with treatment within 6 months.   HPI     Past Medical History:  Diagnosis Date   Diabetes mellitus    type II   Fibroids    HTN (hypertension)    Hyperlipidemia    Iron deficiency anemia    Left acetabular fracture ([place])    Lumbar strain    Obesity    Ovarian cyst     Patient Active Problem List   Diagnosis Date Noted   Insomnia due to other mental disorder 03/18/2021   Generalized anxiety disorder 11/12/2020   Severe recurrent major depression with psychotic features ([place]) 10/04/2020   Bipolar 2 disorder, major depressive episode ([place]) 10/04/2020   Pain in both feet 10/10/2019   Fibroids 04/14/2019   Financial difficulties 04/13/2018   Seasonal allergies 02/06/2017   Osteoarthritis of right ankle 08/29/2016    Tobacco abuse 01/21/2016   Severe obesity (BMI 35.0-39.9) 10/12/2015   Foot ulcer ([place]) 09/13/2015   Ovarian cyst 12/24/2012   Right knee pain 12/09/2012   Hypertension 07/20/2012   Healthcare maintenance 06/27/2011   Uncontrolled type 2 diabetes mellitus ([place]) 05/30/2010   Dyslipidemia 05/30/2010   Depression 05/30/2010   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50    Years: 23.00    Pack years: 11.50    Types: Cigarettes   Smokeless tobacco: Never   Tobacco comments:    1/2PPD  Vaping Use   Vaping Use: Never used  Substance Use Topics   Alcohol use: Yes    Alcohol/week: 0.0 standard drinks    Comment: occ   Drug use: No    Home Medications Prior to Admission medications   Medication Sig Start Date End Date Taking? Authorizing Provider  ibuprofen (ADVIL) 800 MG tablet Take 1 tablet (800 mg total) by mouth every 8 (eight) hours as needed for up to 21 doses. 07/25/21  Yes Wyvonnia Dusky, MD  ARIPiprazole (ABILIFY) 5 MG tablet Take 1 tablet (5 mg total) by mouth daily. 07/12/21   Salley Slaughter, NP  atorvastatin (LIPITOR) 40 MG tablet Take 1 tablet (40 mg total) by mouth daily. 05/30/21   [name], MD  Blood Glucose Monitoring Suppl (CONTOUR NEXT ONE) KIT 1 each by Does not apply route See admin instructions. USE TO  CHECK BLOOD GLUCOSE ONCE DAILY. IM PROGRAM. 01/25/18   [name], MD  busPIRone (BUSPAR) 15 MG tablet Take 1 tablet (15 mg total) by mouth 3 (three) times daily. 07/12/21   Salley Slaughter, NP  diclofenac Sodium (VOLTAREN) 1 % GEL Apply 2 g topically 4 (four) times daily. Rub into affected area of foot 2 to 4 times daily 02/19/21   [name], DPM  Dulaglutide (TRULICITY) 1.5 FB/9.0XY SOPN Inject 1.5 mg into the skin as directed. 1.57m/week    SMadalyn Rob MD  Dulaglutide (TRULICITY) 1.5 MBF/3.8VASOPN Inject 1.5 mg into the skin once a week. 07/23/21   SMadalyn Rob MD  empagliflozin (JARDIANCE) 10 MG TABS tablet Take 1 tablet (10 mg total) by mouth daily before breakfast. 07/03/21   SMadalyn Rob MD  enalapril (VASOTEC) 20 MG tablet TAKE 1 TABLET (20 MG TOTAL) BY MOUTH DAILY. 07/03/20   CAndrew Au MD  enalapril (VASOTEC) 20 MG tablet Take 1 tablet (20 mg total) by mouth daily. 07/14/21   SMadalyn Rob MD  fluconazole (DIFLUCAN) 150 MG tablet Take 1 tablet (150 mg total) by mouth daily. 06/26/21   SMadalyn Rob MD  gabapentin (NEURONTIN) 300 MG capsule Take 1 capsule (300 mg total) by mouth 3 (three) times daily for 3 days, THEN 2 capsules (600 mg total) 3 (three) times daily. 12/18/20 03/21/21  PEulis CannerE, NP  gabapentin (NEURONTIN) 300 MG capsule TAKE 1 CAPSULE BY MOUTH 3 TIMES DAILY FOR 3 DAYS, THEN 2 CAPSULES BY MOUTH 3 TIMES DAILY. 12/18/20 12/18/21  PEulis CannerE, NP  glucose blood (CONTOUR NEXT TEST) test strip Please use to check your sugar 3 times a day. 05/09/19   SWelford Roche MD  hydrOXYzine (ATARAX/VISTARIL) 50 MG tablet Take 1 tablet (50 mg total) by mouth 3 (three) times daily. 03/18/21   PSalley Slaughter NP  ibuprofen (ADVIL) 800 MG tablet Take 1 tablet (800 mg total) by mouth every 8 (eight) hours as needed. 01/07/21   WTrula Slade DPM  insulin aspart protamine - aspart (NOVOLOG MIX 70/30 FLEXPEN) (70-30) 100 UNIT/ML FlexPen Inject 25 Units  into the skin 2 (two) times daily. 07/03/21   SMadalyn Rob MD  Insulin Pen Needle 32G X 4 MM MISC Use daily at 6pm 06/26/21   SMadalyn Rob MD  lamoTRIgine (LAMICTAL) 100 MG  tablet Take 1 tablet (100 mg total) by mouth daily. 03/18/21   Salley Slaughter, NP  Lancets MISC 1 Units by Does not apply route 3 (three) times daily. 05/09/19   Welford Roche, MD  metFORMIN (GLUCOPHAGE) 1000 MG tablet Take 1 tablet (1,000 mg total) by mouth 2 (two) times daily with a meal. 06/04/21   [name], MD  Multiple Vitamins-Minerals (MULTIVITAMIN WITH MINERALS) tablet Take 1 tablet by mouth daily.    [provider]  sertraline (ZOLOFT) 50 MG tablet Take 1 tablet (50 mg total) by mouth daily. 07/12/21   Salley Slaughter, NP  Suvorexant (BELSOMRA) 10 MG TABS Take 1 tablet (10 mg) by mouth at bedtime as needed. 07/12/21   Salley Slaughter, NP    Allergies    Codeine  Review of Systems   Review of Systems  Constitutional:  Negative for chills and fever.  Respiratory:  Negative for cough and shortness of breath.   Cardiovascular:  Negative for chest pain and palpitations.  Gastrointestinal:  Negative for abdominal pain and vomiting.  Genitourinary:  Negative for dysuria and hematuria.  Musculoskeletal:  Positive for arthralgias and myalgias.  Skin:  Negative for color change and rash.  Neurological:  Negative for syncope and headaches.  All other systems reviewed and are negative.  Physical Exam Updated Vital Signs BP 128/80   Pulse 76   Temp 97.9 F (36.6 C) (Oral)   Resp 18   Ht $R'6\' 4"'Ws$  (1.93 m)   Wt 112 kg   SpO2 100%   BMI 30.07 kg/m   Physical Exam Constitutional:      General: She is not in acute distress. HENT:     Head: Normocephalic and atraumatic.  Eyes:     Conjunctiva/sclera: Conjunctivae normal.     Pupils: Pupils are equal, round, and reactive to light.  Cardiovascular:     Rate and Rhythm: Normal rate and regular rhythm.  Pulmonary:     Effort: Pulmonary  effort is normal. No respiratory distress.  Abdominal:     General: There is no distension.     Tenderness: There is no abdominal tenderness.  Musculoskeletal:     Comments: Swelling left knee, small peripatellar effusion, no isolated bony tenderness of patella or fibula or tibia Patient able to flex knee to 90 degrees, can bear weight, walking into ED No warmth or erythema of the knee or leg +Posterior popliteal tenderness, no calf tenderness  Skin:    General: Skin is warm and dry.  Neurological:     General: No focal deficit present.     Mental Status: She is alert. Mental status is at baseline.  Psychiatric:        Mood and Affect: Mood normal.        Behavior: Behavior normal.    ED Results / Procedures / Treatments   Labs (all labs ordered are listed, but only abnormal results are displayed) Labs Reviewed - No data to display  EKG None  Radiology US Venous Img Lower Unilateral Left  Result Date: 07/25/2021 CLINICAL DATA:  left leg swelling, tenderenss and pain in popliteal region, evaluate for DVT vs baker's cyst EXAM: LEFT LOWER EXTREMITY VENOUS DOPPLER ULTRASOUND TECHNIQUE: Gray-scale sonography with graded compression, as well as color Doppler and duplex ultrasound were performed to evaluate the lower extremity deep venous systems from the level of the common femoral vein and including the common femoral, femoral, profunda femoral, popliteal and calf veins including the posterior tibial, peroneal and gastrocnemius  veins when visible. The superficial great saphenous vein was also interrogated. Spectral Doppler was utilized to evaluate flow at rest and with distal augmentation maneuvers in the common femoral, femoral and popliteal veins. COMPARISON:  None. FINDINGS: Contralateral Common Femoral Vein: Respiratory phasicity is normal and symmetric with the symptomatic side. No evidence of thrombus. Normal compressibility. Common Femoral Vein: No evidence of thrombus. Normal  compressibility, respiratory phasicity and response to augmentation. Saphenofemoral Junction: No evidence of thrombus. Normal compressibility and flow on color Doppler imaging. Profunda Femoral Vein: No evidence of thrombus. Normal compressibility and flow on color Doppler imaging. Femoral Vein: No evidence of thrombus. Normal compressibility, respiratory phasicity and response to augmentation. Popliteal Vein: No evidence of thrombus. Normal compressibility, respiratory phasicity and response to augmentation. Calf Veins: No evidence of thrombus. Normal compressibility and flow on color Doppler imaging. Superficial Great Saphenous Vein: No evidence of thrombus. Normal compressibility. Other Findings:  None. IMPRESSION: No evidence of deep venous thrombosis. No Baker's cyst visualized as queried. Electronically Signed   By: Albin Felling M.D.   On: 07/25/2021 09:43   DG Knee Complete 4 Views Left  Result Date: 07/25/2021 CLINICAL DATA:  Pain EXAM: LEFT KNEE - COMPLETE 4+ VIEW COMPARISON:  None FINDINGS: No evidence of fracture, dislocation, or joint effusion. Mild tricompartment marginal spur formation and medial compartment joint space narrowing. Soft tissues are unremarkable. IMPRESSION: 1. No acute findings. 2. Mild osteoarthritis. Electronically Signed   By: Kerby Moors M.D.   On: 07/25/2021 08:42    Procedures Procedures   Medications Ordered in ED Medications  ibuprofen (ADVIL) tablet 800 mg (800 mg Oral Given 07/25/21 5638)    ED Course  I have reviewed the triage vital signs and the nursing notes.  Pertinent labs & imaging results that were available during my care of the patient were reviewed by me and considered in my medical decision making (see chart for details).  Ddx includes joint effusion vs osteoarthritis vs DVT vs baker's cyst vs other  She is afebrile and otherwise well-appearing.  She has no clinical risk factors or exam consistent with septic joint.  X-rays ordered and  reviewed showing no acute fracture. DVT study ordered and reviewed showing no acute DVT or cyst  Motrin given for pain - improvement of pain on reassessment  Clinical Course as of 07/25/21 1151  Thu Jul 25, 2021  0851 IMPRESSION: 1. No acute findings. 2. Mild osteoarthritis [MT]  0948 IMPRESSION: No evidence of deep venous thrombosis.  No Baker's cyst visualized as queried. [MT]    Clinical Course User Index [MT] Beckhem Isadore, Carola Rhine, MD    Final Clinical Impression(s) / ED Diagnoses Final diagnoses:  Acute pain of left knee    Rx / DC Orders ED Discharge Orders          Ordered    ibuprofen (ADVIL) 800 MG tablet  Every 8 hours PRN        07/25/21 0956             Wyvonnia Dusky, MD 07/25/21 1152

## 2021-07-25 NOTE — ED Triage Notes (Signed)
Pt presents to ED POV. Pt c/o L knee pain x4d. Pt reports sharp pain on L side of knee and back of knee.

## 2021-07-26 ENCOUNTER — Other Ambulatory Visit (HOSPITAL_COMMUNITY): Payer: Self-pay

## 2021-07-26 ENCOUNTER — Telehealth (HOSPITAL_COMMUNITY): Payer: Self-pay | Admitting: *Deleted

## 2021-07-26 NOTE — Telephone Encounter (Signed)
[facility] TRACKS APPROVED SEDATIVE HYPNOTIC Suvorexant (BELSOMRA) 10 MG TABS  P.A. : [phone]  EFFECTIVE: 07/26/2021 THRU   01/23/2022

## 2021-07-31 ENCOUNTER — Ambulatory Visit: Payer: Medicaid Other | Admitting: Family Medicine

## 2021-07-31 VITALS — Ht 76.0 in | Wt 247.0 lb

## 2021-07-31 DIAGNOSIS — M25562 Pain in left knee: Secondary | ICD-10-CM | POA: Diagnosis not present

## 2021-07-31 NOTE — Patient Instructions (Signed)
Your history and exam are consistent with a lateral meniscus tear. Continue your meloxicam as you have been (OR the ibuprofen but don't take both). Can try topical voltaren gel. Do home exercises as directed every day. Use cane for support. Knee brace for support when up and walking around - if this one doesn't fit from your brother just call us and we will fit you with one here. Consider steroid injection, just call us and let us know if you want to go ahead with this. Otherwise follow up with me in about 4 weeks (or as needed). Have a great time at the beach!

## 2021-07-31 NOTE — Progress Notes (Signed)
PCP: [name], MD  Subjective:   HPI: Patient is a 50 y.o. female here for left knee pain.  Patient denies known injury or trauma. She has been dealing with pain in her right foot from arthritis and thinks she's started to get pain in left lateral knee due to compensation. Worse when she tries to go up stairs, turning. Tried ibuprofen, melosicam without much benefit. She is using a cane. Knee feels like it catches at times. + swelling.  Past Medical History:  Diagnosis Date   Diabetes mellitus    type II   Fibroids    HTN (hypertension)    Hyperlipidemia    Iron deficiency anemia    Left acetabular fracture ([facility])    Lumbar strain    Obesity    Ovarian cyst     Current Outpatient Medications on File Prior to Visit  Medication Sig Dispense Refill   ARIPiprazole (ABILIFY) 5 MG tablet Take 1 tablet (5 mg total) by mouth daily. 30 tablet 3   atorvastatin (LIPITOR) 40 MG tablet Take 1 tablet (40 mg total) by mouth daily. 30 tablet 11   Blood Glucose Monitoring Suppl (CONTOUR NEXT ONE) KIT 1 each by Does not apply route See admin instructions. USE TO CHECK BLOOD GLUCOSE ONCE DAILY. IM PROGRAM. 1 kit 0   busPIRone (BUSPAR) 15 MG tablet Take 1 tablet (15 mg total) by mouth 3 (three) times daily. 90 tablet 3   diclofenac Sodium (VOLTAREN) 1 % GEL Apply 2 g topically 4 (four) times daily. Rub into affected area of foot 2 to 4 times daily 100 g 2   Dulaglutide (TRULICITY) 1.5 KD/9.8PJ SOPN Inject 1.5 mg into the skin as directed. 1.70m/week     Dulaglutide (TRULICITY) 1.5 MAS/5.0NLSOPN Inject 1.5 mg into the skin once a week. 2 mL 2   empagliflozin (JARDIANCE) 10 MG TABS tablet Take 1 tablet (10 mg total) by mouth daily before breakfast. 90 tablet 1   enalapril (VASOTEC) 20 MG tablet TAKE 1 TABLET (20 MG TOTAL) BY MOUTH DAILY. 90 tablet 3   enalapril (VASOTEC) 20 MG tablet Take 1 tablet (20 mg total) by mouth daily. 90 tablet 2   fluconazole (DIFLUCAN) 150 MG tablet Take 1 tablet  (150 mg total) by mouth daily. 2 tablet 0   gabapentin (NEURONTIN) 300 MG capsule Take 1 capsule (300 mg total) by mouth 3 (three) times daily for 3 days, THEN 2 capsules (600 mg total) 3 (three) times daily. 549 capsule 0   gabapentin (NEURONTIN) 300 MG capsule TAKE 1 CAPSULE BY MOUTH 3 TIMES DAILY FOR 3 DAYS, THEN 2 CAPSULES BY MOUTH 3 TIMES DAILY. 549 capsule 0   glucose blood (CONTOUR NEXT TEST) test strip Please use to check your sugar 3 times a day. 100 strip 3   hydrOXYzine (ATARAX/VISTARIL) 50 MG tablet Take 1 tablet (50 mg total) by mouth 3 (three) times daily. 90 tablet 2   ibuprofen (ADVIL) 800 MG tablet Take 1 tablet (800 mg total) by mouth every 8 (eight) hours as needed. 30 tablet 0   ibuprofen (ADVIL) 800 MG tablet Take 1 tablet (800 mg total) by mouth every 8 (eight) hours as needed for up to 21 doses. 21 tablet 0   insulin aspart protamine - aspart (NOVOLOG MIX 70/30 FLEXPEN) (70-30) 100 UNIT/ML FlexPen Inject 25 Units into the skin 2 (two) times daily. 30 mL 1   Insulin Pen Needle 32G X 4 MM MISC Use daily at 6pm 100 each 4  lamoTRIgine (LAMICTAL) 100 MG tablet Take 1 tablet (100 mg total) by mouth daily. 30 tablet 2   Lancets MISC 1 Units by Does not apply route 3 (three) times daily. 100 each 3   metFORMIN (GLUCOPHAGE) 1000 MG tablet Take 1 tablet (1,000 mg total) by mouth 2 (two) times daily with a meal. 180 tablet 1   Multiple Vitamins-Minerals (MULTIVITAMIN WITH MINERALS) tablet Take 1 tablet by mouth daily.     sertraline (ZOLOFT) 50 MG tablet Take 1 tablet (50 mg total) by mouth daily. 90 tablet 3   Suvorexant (BELSOMRA) 10 MG TABS Take 1 tablet (10 mg) by mouth at bedtime as needed. 30 tablet 2   No current facility-administered medications on file prior to visit.    Past Surgical History:  Procedure Laterality Date   FOOT ARTHRODESIS, MODIFIED MCBRIDE     right foot bunion surgery and ankle surgery 1980s   ORIF ACETABULAR FRACTURE     Lt Hip ORIF for likely SCFE  1980s    Allergies  Allergen Reactions   Codeine Other (See Comments)    hallucinations    Ht '6\' 4"'  (1.93 m)   Wt 247 lb (112 kg)   BMI 30.07 kg/m   No flowsheet data found.  No flowsheet data found.      Objective:  Physical Exam:  Gen: NAD, comfortable in exam room  Left knee: Mild effusion.  No other gross deformity, ecchymoses. TTP lateral joint line.  No other tenderness. FROM with normal strength. Negative ant/post drawers. Negative valgus/varus testing. Negative lachman.  Positive mcmurrays, negative apleys. Positive thessalys. NV intact distally.   Assessment & Plan:  1. Left knee pain - 2/2 lateral meniscus tear, less likely flare of arthritis worse than what is seen on radiographs.  Continue meloxicam, can try voltaren gel.  Offered injection - declined for now.  Quad strengthening exercises, knee brace for support.  F/u in 4 weeks.

## 2021-08-06 ENCOUNTER — Ambulatory Visit: Payer: Medicaid Other | Admitting: Podiatry

## 2021-08-09 ENCOUNTER — Ambulatory Visit: Payer: Self-pay

## 2021-08-09 ENCOUNTER — Telehealth: Payer: Self-pay | Admitting: Pharmacist

## 2021-08-09 NOTE — Patient Outreach (Signed)
  Care Management   Follow Up Note   08/09/2021 Name: Tina [name] MRN: [id number] DOB: 1971/01/19   Referred by: [name], MD Reason for referral : No chief complaint on file.   Successful contact was made with the patient to discuss care management and care coordination services. Patient did not realize she had appointment and was out of town. Patient requests call back to re-schedule.   Follow Up Plan: The care management team will reach out to the patient again over the next 10 days to re-schedule appointment  Hughes Better PharmD, CPP High Risk Managed [facility] [phone]

## 2021-08-14 ENCOUNTER — Other Ambulatory Visit (HOSPITAL_COMMUNITY): Payer: Self-pay

## 2021-08-15 ENCOUNTER — Other Ambulatory Visit: Payer: Self-pay

## 2021-08-15 ENCOUNTER — Ambulatory Visit
Admission: RE | Admit: 2021-08-15 | Discharge: 2021-08-15 | Disposition: A | Discharge status: Home / Self Care | Payer: Medicaid Other | Source: Ambulatory Visit | Attending: Internal Medicine | Admitting: Internal Medicine

## 2021-08-15 DIAGNOSIS — Z1231 Encounter for screening mammogram for malignant neoplasm of breast: Secondary | ICD-10-CM

## 2021-08-19 ENCOUNTER — Other Ambulatory Visit: Payer: Self-pay

## 2021-08-20 ENCOUNTER — Other Ambulatory Visit: Payer: Self-pay

## 2021-08-20 ENCOUNTER — Ambulatory Visit (HOSPITAL_COMMUNITY): Payer: Medicaid Other | Admitting: Licensed Clinical Social Worker

## 2021-08-27 ENCOUNTER — Ambulatory Visit: Payer: Self-pay

## 2021-08-27 ENCOUNTER — Telehealth: Payer: Self-pay | Admitting: Pharmacist

## 2021-08-27 ENCOUNTER — Ambulatory Visit (INDEPENDENT_AMBULATORY_CARE_PROVIDER_SITE_OTHER): Payer: Medicaid Other | Admitting: Licensed Clinical Social Worker

## 2021-08-27 DIAGNOSIS — F3181 Bipolar II disorder: Secondary | ICD-10-CM | POA: Diagnosis not present

## 2021-08-27 DIAGNOSIS — F411 Generalized anxiety disorder: Secondary | ICD-10-CM

## 2021-08-27 NOTE — Plan of Care (Signed)
Pt agreeable to plan  ?

## 2021-08-27 NOTE — Patient Outreach (Signed)
08/27/2021 Name: Kaylei Frink MRN: [id number] DOB: 25-Mar-1971  Referred by: [name], MD Reason for referral : No chief complaint on file.   An unsuccessful telephone outreach was attempted today. The patient was referred to the case management team for assistance with care management and care coordination.    Follow Up Plan: Left message with contact information to reschedule with family member who answered phone for patient  Hughes Better PharmD, CPP High Risk Managed [employer] [place] [phone]

## 2021-08-27 NOTE — Progress Notes (Addendum)
   THERAPIST PROGRESS NOTE  Session Time: 18   Virtual Visit via Telephone Note  I connected with Jodi Marble Moxley on 08/27/21 at  8:00 AM EDT by telephone and verified that I am speaking with the correct person using two identifiers.  Location: Patient: [facility] Provider: [facility]   I discussed the limitations, risks, security and privacy concerns of performing an evaluation and management service by telephone and the availability of in person appointments. I also discussed with the patient that there may be a patient responsible charge related to this service. The patient expressed understanding and agreed to proceed.       I discussed the assessment and treatment plan with the patient. The patient was provided an opportunity to ask questions and all were answered. The patient agreed with the plan and demonstrated an understanding of the instructions.   The patient was advised to call back or seek an in-person evaluation if the symptoms worsen or if the condition fails to improve as anticipated.  I provided 40 minutes of non-face-to-face time during this encounter.   Dory Horn, LCSW   Participation Level: Active  Behavioral Response: Casual and Fairly GroomedAlertAnxious and Depressed  Type of Therapy: Individual Therapy  Treatment Goals addressed: Anxiety and Diagnosis: bipolar depression   Interventions: CBT, Motivational Interviewing, and Supportive  Summary: Taimi Towe is a 50 y.o. female who presents with depressed and anxious mood\affect.  Patient was pleasant, cooperative, and maintained good eye contact.  Leandrea was alert and oriented x5.  She maintained good engagement and was dressed casually in therapy session.   Primary stressor for patient today is grief\loss.  Patient reports that over 1 year ago she lost her mother to kidney failure.  Patient reports that she is having trouble with "closure".   This is as evidenced by grave marker being wrong and having to get it fixed and being double charged by the [employer] for funeral costs.  Patient reports that this has been going on for about 1 year and has yet to be fixed.  Other stressors for patient are illness as evidenced by patient stating "neuropathy".  Krupa reports that this is poorly controlled for pain although she is getting medications and working with her providers on the situation.  Suicidal/Homicidal: NAwithout intent/plan  Therapist Response:    Intervention/Plan: Interventions utilized in today's session were supportive therapy, cognitive behavioral health therapy and motivational interviewing.  LCSW utilized summarization and positive affirmations in today's session for motivational interviewing.  LCSW utilized praise and support for supportive therapy.  LCSW educated patient on PHQ-9 and GAD-7 scores patient's current scores are a 12 and a 14.  LCSW utilized cognitive restructuring and education on cognitive distortions.  Plan for patient moving forward will be to write a rough draft letter to her mother stating her thoughts feelings and emotions when it comes to her mother's death.  Patient also to come up with 3 other stressors or concerns that she has with her depression\anxiety.  Plan: Return again in 2  weeks.     Dory Horn, LCSW 08/27/2021

## 2021-09-06 ENCOUNTER — Other Ambulatory Visit (HOSPITAL_COMMUNITY): Payer: Self-pay

## 2021-09-09 ENCOUNTER — Other Ambulatory Visit (HOSPITAL_COMMUNITY): Payer: Self-pay

## 2021-09-10 ENCOUNTER — Telehealth: Payer: Self-pay

## 2021-09-10 NOTE — Telephone Encounter (Signed)
RTC, patient asking when she is due for her Depo injection. Per chart review, last depo provera given 06/26/21, patient should receive next dep in 12-14 weeks which would have her due 11/16-11/30.  She was informed of this and states she will call back to schedule. No further needs. SChaplin, RN,BSN

## 2021-09-10 NOTE — Telephone Encounter (Signed)
Requesting to speak with a nurse about something, please call pt back.  

## 2021-09-11 ENCOUNTER — Ambulatory Visit (INDEPENDENT_AMBULATORY_CARE_PROVIDER_SITE_OTHER): Payer: Medicaid Other | Admitting: Licensed Clinical Social Worker

## 2021-09-11 ENCOUNTER — Other Ambulatory Visit (HOSPITAL_COMMUNITY): Payer: Self-pay

## 2021-09-11 DIAGNOSIS — F411 Generalized anxiety disorder: Secondary | ICD-10-CM

## 2021-09-11 DIAGNOSIS — F3181 Bipolar II disorder: Secondary | ICD-10-CM

## 2021-09-11 NOTE — Progress Notes (Addendum)
   THERAPIST PROGRESS NOTE   Virtual Visit via Telephone Note  I connected with Tina [name] on 09/11/21 at  8:00 AM EST by telephone and verified that I am speaking with the correct person using two identifiers.  Location: Patient: [facility]  Provider: [facility]    I discussed the limitations, risks, security and privacy concerns of performing an evaluation and management service by telephone and the availability of in person appointments. I also discussed with the patient that there may be a patient responsible charge related to this service. The patient expressed understanding and agreed to proceed.       I discussed the assessment and treatment plan with the patient. The patient was provided an opportunity to ask questions and all were answered. The patient agreed with the plan and demonstrated an understanding of the instructions.   The patient was advised to call back or seek an in-person evaluation if the symptoms worsen or if the condition fails to improve as anticipated.  I provided 30 minutes of non-face-to-face time during this encounter.   Dory Horn, LCSW   Participation Level: Active  Behavioral Response: AlertAnxious, Depressed, and Hopeless  Type of Therapy: Individual Therapy  Treatment Goals addressed: Anxiety and Diagnosis: bipolar depression  Interventions: CBT and Supportive  Summary: Tina [name] is a 50 y.o. female who presents with depressed and flat mood  Geneive was pleasant and cooperative in therapy session.  She was not observed as session was conducted via phone.  Bali was alert and oriented x5.   Patient's primary stressors are depression, anxiety, and grief\loss.  LCSW test the patient with homework for writing a letter to her deceased mother to help gain closure.  Patient reports that she did not want to do that as it only would increase her depression and anxiety.  LCSW did speak with  patient about how she knew this as she did not attempt  the letter, patient stated "I just now".  LCSW did go on to explain that goals and objectives were a significant part of therapy and they would need to at least be attempted moving forward.  LCSW inquired about other things that patient want to do.  Patient stated that she wants to get back into the gym.  LCSW asked the patient with a new goal\objective for following up with a gym membership in the next 4 weeks.  After gym membership is obtained then a workout regiment would more specifically be plan for. Suicidal/Homicidal: NAwithout intent/plan  Therapist Response:    Intervention: Interventions utilized in today's session was administering a GAD-7 and a PHQ-9.  GAD-7 score remained the same from 2 weeks ago at 14.  PHQ-9 score did go down 3 points from a 14 to an 11.  Patient still remains at somewhat difficulty for both GAD-7 and PHQ-9.  Other interventions utilized in today's session were supportive therapy, person centered therapy, and cognitive behavioral therapy.  LCSW attempted to utilize cognitive restructuring and reframing for replacing negative thoughts with Positive ones.  LCSW also utilized praise and encouragement for supportive therapy and genuineness, empowerment, and positive affirmations for person centered therapy.  Plan: Return again in 4 weeks.     Dory Horn, LCSW 09/11/2021

## 2021-09-18 ENCOUNTER — Ambulatory Visit (INDEPENDENT_AMBULATORY_CARE_PROVIDER_SITE_OTHER): Payer: Medicaid Other | Admitting: *Deleted

## 2021-09-18 DIAGNOSIS — D219 Benign neoplasm of connective and other soft tissue, unspecified: Secondary | ICD-10-CM

## 2021-09-18 MED ORDER — MEDROXYPROGESTERONE ACETATE 104 MG/0.65ML ~~LOC~~ SUSY
104.0000 mg | PREFILLED_SYRINGE | Freq: Once | SUBCUTANEOUS | Status: AC
Start: 2021-09-18 — End: 2021-09-18
  Administered 2021-09-18: 104 mg via SUBCUTANEOUS

## 2021-09-18 NOTE — Progress Notes (Signed)
    Patient presents for Depo Provera injection States feeling well Last injection received 06/26/21  Patient is within dates for injection  Depo Provera given subq right thigh. Patient tolerated well.  Next injection due 2/8-2/22/2023  Reminder card given  L. Leland Staszewski, BSN, RN-BC

## 2021-09-24 ENCOUNTER — Other Ambulatory Visit (HOSPITAL_COMMUNITY): Payer: Self-pay

## 2021-09-24 NOTE — Progress Notes (Signed)
I have reviewed the note and agree, I was available for questions as needed.

## 2021-10-01 ENCOUNTER — Ambulatory Visit: Payer: Medicaid Other | Admitting: Podiatry

## 2021-10-04 ENCOUNTER — Ambulatory Visit (HOSPITAL_COMMUNITY): Payer: Self-pay | Admitting: Licensed Clinical Social Worker

## 2021-10-09 ENCOUNTER — Ambulatory Visit: Payer: Medicaid Other | Admitting: Dietician

## 2021-10-09 ENCOUNTER — Encounter: Payer: Medicaid Other | Admitting: Internal Medicine

## 2021-10-11 ENCOUNTER — Encounter (HOSPITAL_COMMUNITY): Payer: Self-pay | Admitting: Psychiatry

## 2021-10-11 ENCOUNTER — Telehealth (HOSPITAL_COMMUNITY): Payer: Self-pay | Admitting: Licensed Clinical Social Worker

## 2021-10-11 ENCOUNTER — Telehealth (INDEPENDENT_AMBULATORY_CARE_PROVIDER_SITE_OTHER): Payer: Medicare Other | Admitting: Psychiatry

## 2021-10-11 ENCOUNTER — Other Ambulatory Visit (HOSPITAL_COMMUNITY): Payer: Self-pay

## 2021-10-11 DIAGNOSIS — F3181 Bipolar II disorder: Secondary | ICD-10-CM | POA: Diagnosis not present

## 2021-10-11 DIAGNOSIS — G4700 Insomnia, unspecified: Secondary | ICD-10-CM

## 2021-10-11 DIAGNOSIS — F411 Generalized anxiety disorder: Secondary | ICD-10-CM

## 2021-10-11 MED ORDER — SERTRALINE HCL 50 MG PO TABS
50.0000 mg | ORAL_TABLET | Freq: Every day | ORAL | 3 refills | Status: DC
  Filled 2021-10-11: qty 90, 90d supply, fill #0

## 2021-10-11 MED ORDER — LAMOTRIGINE 100 MG PO TABS
100.0000 mg | ORAL_TABLET | Freq: Every day | ORAL | 2 refills | Status: DC
  Filled 2021-10-11: qty 30, 30d supply, fill #0
  Filled 2021-12-24: qty 30, 30d supply, fill #1

## 2021-10-11 MED ORDER — BUSPIRONE HCL 15 MG PO TABS
15.0000 mg | ORAL_TABLET | Freq: Three times a day (TID) | ORAL | 3 refills | Status: DC
Start: 2021-10-11 — End: 2021-12-27
  Filled 2021-10-11: qty 90, 30d supply, fill #0

## 2021-10-11 MED ORDER — ARIPIPRAZOLE 10 MG PO TABS
10.0000 mg | ORAL_TABLET | Freq: Every day | ORAL | 3 refills | Status: DC
Start: 2021-10-11 — End: 2021-12-27
  Filled 2021-10-11: qty 30, 30d supply, fill #0
  Filled 2021-11-24: qty 30, 30d supply, fill #1

## 2021-10-11 MED ORDER — BELSOMRA 10 MG PO TABS
10.0000 mg | ORAL_TABLET | Freq: Every evening | ORAL | 2 refills | Status: DC | PRN
  Filled 2021-10-11 – 2021-12-24 (×2): qty 30, 30d supply, fill #0

## 2021-10-11 NOTE — Telephone Encounter (Signed)
LCSW called pt due to request to change counselor. Pt answer phone and LCSW inquired about barriers to care. Pt said "No I am good" then hung up the phone. Pt will be transfer to [facility].

## 2021-10-11 NOTE — Progress Notes (Signed)
[place] MD/PA/NP OP Progress Note Virtual Visit via Telephone Note  I connected with Tina [name] on 10/11/21 at  9:00 AM EST by telephone and verified that I am speaking with the correct person using two identifiers.  Location: Patient: home Provider: Clinic   I discussed the limitations, risks, security and privacy concerns of performing an evaluation and management service by telephone and the availability of in person appointments. I also discussed with the patient that there may be a patient responsible charge related to this service. The patient expressed understanding and agreed to proceed.   I provided 30 minutes of non-face-to-face time during this encounter.   10/11/2021 8:25 AM Tina [name]  MRN:  [id number]  Chief Complaint: "I have not been sleeping"  HPI: 50 year old female seen today for follow up psychiatric evaluation.   She has a psychiatric history of anxiety, depression, and bipolar 2 disorder.  She is currently managed on Lamictal 100 mg daily,  Zoloft 50 mg daily, Abilify 5 mg daily,Belsomra 10 mg, and BuSpar 15 mg 3 times daily.  Patient also takes gabapentin 300 mg 3 times daily which she received from her PCP.   She notes that her medications are somewhat effective in managing her psychiatric conditions.    Today patient unable to log on virtually so the call was done over the phone. During assessment she was pleasant, cooperative, and engaged in conversation.  She informed provider that She continues to have poor sleep. Noting she sleeps less than an hour nightly. She denies illegal drug use, increased life stressors, or other factors that can interfere with her sleep. Provider recommended patient having a sleep study done to evaluate further. She was agreeable to this request.  Patient notes that Thanksgiving was hard as it is the first holiday she celebrated without her mother. She informed Probation officer that she and her brother left the city and  went to the beach.  Provider conducted a GAD-7 and patient scored an 15, at her last visit she scored a 11.   A PHQ-9 was conducted on 9 and patient scored an 13, at her last visit she scored a 11.  She also endorses auditory and visual hallucinations. Patient notes that she hears random noise and see figures. She endorses having and adequate appetite. Today she denies SI/HI or paranoia.  Patient informed writer that her ankle, legs, and feet continues to be painful.  She informed provider that gabapentin and Voltaren gel has been ineffective.    Today she is agreeable to Increase Abilify 5 mg to 10 mg to help manage psychosis and mood. Sheill continue all other medications as prescribed. Patient She referred to a sleep specialist. .  She will also follow-up with outpatient counseling for therapy.  No other concerns noted at this time.     Visit Diagnosis:  No diagnosis found.   Past Psychiatric History: anxiety, depression, and bipolar 2 disorder Past Medical History:  Past Medical History:  Diagnosis Date   Diabetes mellitus    type II   Fibroids    HTN (hypertension)    Hyperlipidemia    Iron deficiency anemia    Left acetabular fracture (HCC)    Lumbar strain    Obesity    Ovarian cyst     Past Surgical History:  Procedure Laterality Date   FOOT ARTHRODESIS, MODIFIED MCBRIDE     right foot bunion surgery and ankle surgery 1980s   ORIF ACETABULAR FRACTURE     Lt Hip ORIF for  likely SCFE 98s    Family Psychiatric History: Niece Schizophrenia  Family History:  Family History  Problem Relation Age of Onset   Colon cancer Sister    Diabetes Mother    Diabetes Father    Diabetes Brother    Celiac disease Paternal Aunt     Social History:  Social History   Socioeconomic History   Marital status: Single    Spouse name: Not on file   Number of children: 0   Years of education: 12   Highest education level: Some college, no degree  Occupational History    Occupation: Engineer, materials: UNEMPLOYED    Employer: [employer]  Tobacco Use   Smoking status: Every Day    Packs/day: 0.50    Years: 23.00    Pack years: 11.50    Types: Cigarettes   Smokeless tobacco: Never   Tobacco comments:    1/2PPD  Vaping Use   Vaping Use: Never used  Substance and Sexual Activity   Alcohol use: Yes    Alcohol/week: 0.0 standard drinks    Comment: occ   Drug use: No   Sexual activity: Yes    Partners: Female    Birth control/protection: Injection    Comment: Depo  Other Topics Concern   Not on file  Social History Narrative   Homosexual; in a monogamous relationship w/ homosexual woman.   She is disabled.  She last worked in 2018 at [employer].    Right handed   One story home   Social Determinants of Health   Financial Resource Strain: High Risk   Difficulty of Paying Living Expenses: Hard  Food Insecurity: No Food Insecurity   Worried About Charity fundraiser in the Last Year: Never true   Ran Out of Food in the Last Year: Never true  Transportation Needs: No Transportation Needs   Lack of Transportation (Medical): No   Lack of Transportation (Non-Medical): No  Physical Activity: Not on file  Stress: Stress Concern Present   Feeling of Stress : Very much  Social Connections: Not on file    Allergies:  Allergies  Allergen Reactions   Codeine Other (See Comments)    hallucinations    Metabolic Disorder Labs: Lab Results  Component Value Date   HGBA1C >14.0 (A) 06/26/2021   MPG 200 05/25/2017   MPG 237 02/26/2016   No results found for: PROLACTIN Lab Results  Component Value Date   CHOL 154 08/08/2020   TRIG 142 08/08/2020   HDL 35 (L) 08/08/2020   CHOLHDL 4.4 08/08/2020   VLDL 50 (H) 08/21/2014   LDLCALC 94 08/08/2020   LDLCALC 96 01/07/2019   Lab Results  Component Value Date   TSH 1.074 06/27/2011    Therapeutic Level Labs: No results found for: LITHIUM No results found for: VALPROATE No components found for:   CBMZ  Current Medications: Current Outpatient Medications  Medication Sig Dispense Refill   ARIPiprazole (ABILIFY) 5 MG tablet Take 1 tablet (5 mg total) by mouth daily. 30 tablet 3   atorvastatin (LIPITOR) 40 MG tablet Take 1 tablet (40 mg total) by mouth daily. 30 tablet 11   Blood Glucose Monitoring Suppl (CONTOUR NEXT ONE) KIT 1 each by Does not apply route See admin instructions. USE TO CHECK BLOOD GLUCOSE ONCE DAILY. IM PROGRAM. 1 kit 0   busPIRone (BUSPAR) 15 MG tablet Take 1 tablet (15 mg total) by mouth 3 (three) times daily. 90 tablet 3   diclofenac Sodium (VOLTAREN)  1 % GEL Apply 2 g topically 4 (four) times daily. Rub into affected area of foot 2 to 4 times daily 100 g 2   Dulaglutide (TRULICITY) 1.5 WU/9.8JX SOPN Inject 1.5 mg into the skin as directed. 1.40m/week     Dulaglutide (TRULICITY) 1.5 MBJ/4.7WGSOPN Inject 1.5 mg into the skin once a week. 2 mL 2   empagliflozin (JARDIANCE) 10 MG TABS tablet Take 1 tablet (10 mg total) by mouth daily before breakfast. 90 tablet 1   enalapril (VASOTEC) 20 MG tablet TAKE 1 TABLET (20 MG TOTAL) BY MOUTH DAILY. 90 tablet 3   enalapril (VASOTEC) 20 MG tablet Take 1 tablet (20 mg total) by mouth daily. 90 tablet 2   fluconazole (DIFLUCAN) 150 MG tablet Take 1 tablet (150 mg total) by mouth daily. 2 tablet 0   gabapentin (NEURONTIN) 300 MG capsule Take 1 capsule (300 mg total) by mouth 3 (three) times daily for 3 days, THEN 2 capsules (600 mg total) 3 (three) times daily. 549 capsule 0   gabapentin (NEURONTIN) 300 MG capsule TAKE 1 CAPSULE BY MOUTH 3 TIMES DAILY FOR 3 DAYS, THEN 2 CAPSULES BY MOUTH 3 TIMES DAILY. 549 capsule 0   glucose blood (CONTOUR NEXT TEST) test strip Please use to check your sugar 3 times a day. 100 strip 3   hydrOXYzine (ATARAX/VISTARIL) 50 MG tablet Take 1 tablet (50 mg total) by mouth 3 (three) times daily. 90 tablet 2   ibuprofen (ADVIL) 800 MG tablet Take 1 tablet (800 mg total) by mouth every 8 (eight) hours as needed.  30 tablet 0   ibuprofen (ADVIL) 800 MG tablet Take 1 tablet (800 mg total) by mouth every 8 (eight) hours as needed for up to 21 doses. 21 tablet 0   insulin aspart protamine - aspart (NOVOLOG MIX 70/30 FLEXPEN) (70-30) 100 UNIT/ML FlexPen Inject 25 Units into the skin 2 (two) times daily. 30 mL 1   Insulin Pen Needle 32G X 4 MM MISC Use daily at 6pm 100 each 4   lamoTRIgine (LAMICTAL) 100 MG tablet Take 1 tablet (100 mg total) by mouth daily. 30 tablet 2   Lancets MISC 1 Units by Does not apply route 3 (three) times daily. 100 each 3   metFORMIN (GLUCOPHAGE) 1000 MG tablet Take 1 tablet (1,000 mg total) by mouth 2 (two) times daily with a meal. 180 tablet 1   Multiple Vitamins-Minerals (MULTIVITAMIN WITH MINERALS) tablet Take 1 tablet by mouth daily.     sertraline (ZOLOFT) 50 MG tablet Take 1 tablet (50 mg total) by mouth daily. 90 tablet 3   Suvorexant (BELSOMRA) 10 MG TABS Take 1 tablet (10 mg) by mouth at bedtime as needed. 30 tablet 2   No current facility-administered medications for this visit.     Musculoskeletal: Strength & Muscle Tone:  Unable to assess due to telephone visit Gait & Station:  Unable to assess due to telephone visit Patient leans: N/A  Psychiatric Specialty Exam: Review of Systems  There were no vitals taken for this visit.There is no height or weight on file to calculate BMI.  General Appearance:  Unable to assess due to telephone visit  Eye Contact:   Unable to assess due to telephone visit  Speech:  Clear and Coherent and Normal Rate  Volume:  Normal  Mood:  Anxious and Depressed  Affect:  Appropriate and Congruent  Thought Process:  Coherent, Goal Directed and Linear  Orientation:  Full (Time, Place, and Person)  Thought Content:  Logical and Hallucinations: Auditory Visual   Suicidal Thoughts:  No  Homicidal Thoughts:  No  Memory:  Immediate;   Good Recent;   Good Remote;   Good  Judgement:  Good  Insight:  Good  Psychomotor Activity:  Normal   Concentration:  Concentration: Good and Attention Span: Good  Recall:  Good  Fund of Knowledge: Good  Language: Good  Akathisia:  No  Handed:  Right  AIMS (if indicated):Not done  Assets:  Communication Skills Desire for Improvement Financial Resources/Insurance Housing Social Support  ADL's:  Intact  Cognition: WNL  Sleep:  Poor   Screenings: GAD-7    Health and safety inspector from 09/11/2021 in [facility] Counselor from 08/27/2021 in [facility] Video Visit from 07/12/2021 in [facility] Video Visit from 03/18/2021 in [facility] Video Visit from 12/18/2020 in [facility]  Total GAD-7 Score _0 PHQ2-9    Flowsheet Row Counselor from 09/11/2021 in [facility] Counselor from 08/27/2021 in [facility] Office Visit from 07/10/2021 in [place] Office Visit from 07/03/2021 in [place] Office Visit from 06/26/2021 in [place]  PHQ-2 Total Score _1 PHQ-9 Total Score _2 Flowsheet Row Counselor from 09/11/2021 in [facility] ED from 07/25/2021 in [place] Video Visit from 03/18/2021 in [place] No Risk Error: Q7 should not be populated when Q6 is No        Assessment and Plan: Patient endorses symptoms of anxiety, depression, VAHand insomnia.  Today she is agreeable to Increase Abilify 5 mg to 10 mg to help manage psychosis and mood. Sheill continue all other medications as prescribed. Patient She referred to a sleep speci  1. Bipolar 2 disorder, major depressive episode (HCC)  Increased- ARIPiprazole (ABILIFY) 10 MG tablet; Take 1 tablet (10 mg total)  by mouth daily.  Dispense: 30 tablet; Refill: 3 Continue- lamoTRIgine (LAMICTAL) 100 MG tablet; Take 1 tablet (100 mg total) by mouth daily.  Dispense: 30 tablet; Refill: 2 Continue- Suvorexant (BELSOMRA) 10 MG TABS; Take 1 tablet (10 mg) by mouth at bedtime as needed.  Dispense: 30 tablet; Refill: 2  2. Generalized anxiety disorder  Continue- busPIRone (BUSPAR) 15 MG tablet; Take 1 tablet (15 mg total) by mouth 3 (three) times daily.  Dispense: 90 tablet; Refill: 3 Continue- sertraline (ZOLOFT) 50 MG tablet; Take 1 tablet (50 mg total) by mouth daily.  Dispense: 90 tablet; Refill: 3  3. Insomnia, unspecified type  - Polysomnography 4 or more parameters; Future    Follow up in 3 month  Follow up with therapy Salley Slaughter, NP 10/11/2021, 8:25 AM

## 2021-10-13 ENCOUNTER — Other Ambulatory Visit: Payer: Self-pay | Admitting: Internal Medicine

## 2021-10-13 DIAGNOSIS — E1165 Type 2 diabetes mellitus with hyperglycemia: Secondary | ICD-10-CM

## 2021-10-14 ENCOUNTER — Other Ambulatory Visit (HOSPITAL_COMMUNITY): Payer: Self-pay

## 2021-10-14 MED ORDER — TRULICITY 1.5 MG/0.5ML ~~LOC~~ SOAJ
1.5000 mg | SUBCUTANEOUS | 1 refills | Status: DC
  Filled ????-??-??: fill #0

## 2021-10-14 NOTE — Telephone Encounter (Signed)
Next appt scheduled 10/16/21 with PCP.

## 2021-10-15 ENCOUNTER — Ambulatory Visit (INDEPENDENT_AMBULATORY_CARE_PROVIDER_SITE_OTHER): Payer: Medicare Other | Admitting: Podiatry

## 2021-10-15 ENCOUNTER — Other Ambulatory Visit: Payer: Self-pay

## 2021-10-15 ENCOUNTER — Other Ambulatory Visit (HOSPITAL_COMMUNITY): Payer: Self-pay

## 2021-10-15 DIAGNOSIS — Q828 Other specified congenital malformations of skin: Secondary | ICD-10-CM

## 2021-10-15 DIAGNOSIS — E1149 Type 2 diabetes mellitus with other diabetic neurological complication: Secondary | ICD-10-CM | POA: Diagnosis not present

## 2021-10-15 MED ORDER — IBUPROFEN 800 MG PO TABS
800.0000 mg | ORAL_TABLET | Freq: Three times a day (TID) | ORAL | 0 refills | Status: DC | PRN
  Filled 2021-10-15: qty 30, 10d supply, fill #0

## 2021-10-15 NOTE — Progress Notes (Signed)
Subjective: 50 year old female presents the office today for evaluation of chronic bilateral foot discomfort and calluses. She states that the calluses have been hurting. She missed her last appointment. She has noticed some swelling to the right ankle, pointing to the medial ankle. No injury. She has not been wearing the brace.   Last A1c was > 14 on 06/26/2021 Scheduled for recheck today.   Objective: AAO x3, NAD DP/PT pulses palpable bilaterally, CRT less than 3 seconds Hyperkeratotic tissue present left medial second toe, left fifth MPJ, left submetatarsal 2, and first interspace right foot.  There is no underlying ulceration drainage or any signs of infection noted today.   There is continued mild tenderness palpation on the anterior lateral aspect of the right ankle.  Mild edema to the medial ankle. There is no erythema or warmth. No area of pinpoint tenderness.  Tenderness is present on the course of the posterior tibial, flexor tendons. No pain with calf compression, swelling, warmth, erythema.    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 -Debride the hyperkeratotic lesions any complications or bleeding x 3.  -Previously right ankle ibuprofen as needed.  Recommended brace.  Unfortunately A1c is significantly higher proceed having it rechecked again today.  Consider surgical invention in the future if needed for ankle fusion.  [name] DPM

## 2021-10-16 ENCOUNTER — Telehealth: Payer: Self-pay

## 2021-10-16 ENCOUNTER — Encounter: Payer: Self-pay | Admitting: Internal Medicine

## 2021-10-16 ENCOUNTER — Other Ambulatory Visit: Payer: Self-pay | Admitting: Dietician

## 2021-10-16 ENCOUNTER — Ambulatory Visit (INDEPENDENT_AMBULATORY_CARE_PROVIDER_SITE_OTHER): Payer: Medicare Other | Admitting: Internal Medicine

## 2021-10-16 ENCOUNTER — Ambulatory Visit (INDEPENDENT_AMBULATORY_CARE_PROVIDER_SITE_OTHER): Payer: Medicare Other | Admitting: Dietician

## 2021-10-16 ENCOUNTER — Other Ambulatory Visit (HOSPITAL_COMMUNITY): Payer: Self-pay

## 2021-10-16 ENCOUNTER — Encounter: Payer: Self-pay | Admitting: Dietician

## 2021-10-16 VITALS — BP 137/79 | HR 86 | Temp 98.6°F | Wt 274.4 lb

## 2021-10-16 DIAGNOSIS — Z794 Long term (current) use of insulin: Secondary | ICD-10-CM | POA: Diagnosis not present

## 2021-10-16 DIAGNOSIS — E1165 Type 2 diabetes mellitus with hyperglycemia: Secondary | ICD-10-CM | POA: Diagnosis not present

## 2021-10-16 DIAGNOSIS — I10 Essential (primary) hypertension: Secondary | ICD-10-CM

## 2021-10-16 DIAGNOSIS — E1169 Type 2 diabetes mellitus with other specified complication: Secondary | ICD-10-CM

## 2021-10-16 DIAGNOSIS — E119 Type 2 diabetes mellitus without complications: Secondary | ICD-10-CM

## 2021-10-16 LAB — GLUCOSE, CAPILLARY: Glucose-Capillary: 120 mg/dL — ABNORMAL HIGH (ref 70–99)

## 2021-10-16 LAB — POCT GLYCOSYLATED HEMOGLOBIN (HGB A1C): Hemoglobin A1C: 7 % — AB (ref 4.0–5.6)

## 2021-10-16 MED ORDER — TIRZEPATIDE 5 MG/0.5ML ~~LOC~~ SOAJ
5.0000 mg | SUBCUTANEOUS | 0 refills | Status: DC
  Filled 2021-10-16: qty 2, 28d supply, fill #0

## 2021-10-16 MED ORDER — TIRZEPATIDE 2.5 MG/0.5ML ~~LOC~~ SOAJ
2.5000 mg | SUBCUTANEOUS | 0 refills | Status: DC
  Filled 2021-10-16 – 2021-10-17 (×2): qty 2, 28d supply, fill #0

## 2021-10-16 MED ORDER — AMLODIPINE-OLMESARTAN 5-20 MG PO TABS
1.0000 | ORAL_TABLET | Freq: Every day | ORAL | 1 refills | Status: DC
  Filled 2021-10-16: qty 30, 30d supply, fill #0
  Filled 2021-11-24: qty 30, 30d supply, fill #1

## 2021-10-16 NOTE — Progress Notes (Signed)
° °  CC: Type 2 Diabetes Mellitus and Hypertension  HPI:Ms.Lynze Reddy is a 50 y.o. female who presents for evaluation of type 2 diabetes mellitus and hypertension. Please see individual problem based A/P for details.  Past Medical History:  Diagnosis Date   Diabetes mellitus    type II   Fibroids    HTN (hypertension)    Hyperlipidemia    Iron deficiency anemia    Left acetabular fracture (HCC)    Lumbar strain    Obesity    Ovarian cyst    Review of Systems:   Review of Systems  Constitutional:  Negative for chills and fever.  Neurological:  Negative for dizziness.    Physical Exam: Vitals:   10/16/21 0921  BP: 137/79  Pulse: 86  Temp: 98.6 F (37 C)  TempSrc: Oral  SpO2: 100%  Weight: 274 lb 6.4 oz (124.5 kg)   General: Middle age female, sitting in hospital wheelchair and has her cain she uses for ambulation HEENT: Conjunctiva nl , antiicteric sclerae, moist mucous membranes, no exudate or erythema Cardiovascular: Normal rate, regular rhythm.  No murmurs Pulmonary : Equal breath sounds, No wheezes     Assessment & Plan:   See Encounters Tab for problem based charting.  Patient discussed with Dr. Jimmye Norman

## 2021-10-16 NOTE — Patient Instructions (Addendum)
Your eye exam at [facility] is March 16 at 82 AM, that is a Thursday  I'll send in the order for your Continuous glucose monitor. You should get a call from them.  You are going to call [facility] to get a list of covered dentists. Then make an appointment  Keep working on monitoring your blood sugar- as we discussed 6+ times a day.   We can follow up when you get your Continuous glucose monitoring sensors in the mail.   Butch Penny [phone]

## 2021-10-16 NOTE — Telephone Encounter (Signed)
PA  for pt ( MOUNJARO 2.5 MG /0.5ML PEN INJECTOR ) came though on cover my meds was done and submitted with office notes from 12/14.Marland Kitchen Awaiting approval or denial

## 2021-10-16 NOTE — Progress Notes (Signed)
Internal Medicine Clinic Attending  Case discussed with Dr. Steen  At the time of the visit.  We reviewed the resident's history and exam and pertinent patient test results.  I agree with the assessment, diagnosis, and plan of care documented in the resident's note.  

## 2021-10-16 NOTE — Patient Instructions (Signed)
1. Type 2 diabetes mellitus with other specified complication, with long-term current use of insulin (HCC) Lab Results  Component Value Date   HGBA1C 7.0 (A) 10/16/2021    - POC Hbg A1C - Glucose, capillary - tirzepatide (MOUNJARO) 2.5 MG/0.5ML Pen; Inject 2.5 mg into the skin once a week.  Dispense: 2 mL; Refill: 0 - tirzepatide (MOUNJARO) 5 MG/0.5ML Pen; Inject 5 mg into the skin once a week.  Dispense: 2 mL; Refill: 0  2. Primary hypertension - Stop enalapril  - BMP8+Anion Gap - amLODipine-olmesartan (AZOR) 5-20 MG tablet; Take 1 tablet by mouth daily.  Dispense: 30 tablet; Refill: 1

## 2021-10-16 NOTE — Telephone Encounter (Signed)
Patient requests Freestyle Libre 2 sensors , referral to [facility].

## 2021-10-16 NOTE — Progress Notes (Signed)
Diabetes Self-Management Education  Visit Type: 4 Month Follow-Up  Appt. Start Time: 815 Appt. End Time: 269  10/16/2021  [name], identified by name and date of birth, is a 50 y.o. female with a diagnosis of Diabetes:  Marland Kitchen Type 2  ASSESSMENT Wants a Prep exercise referral and a Cgm order- called [facility] to begin order Dental - she has [employer] - she is going to call them to find out who takes their insurance and then call an office to make an appointment Eye exam rescheduled today Concerned about weight gain; feels she has made some progress with changing intake. Self monitoring- she is not checking blood sugar at home; Free sample freestyle libre 2 sensor provided to patient today with education on how to place, download app, start sensor and use the information it provides    Wt Readings from Last 10 Encounters:  10/16/21 274 lb 6.4 oz (124.5 kg)  07/31/21 247 lb (112 kg)  07/25/21 247 lb (112 kg)  07/10/21 257 lb 3.2 oz (116.7 kg)  07/10/21 257 lb 3.2 oz (116.7 kg)  07/03/21 253 lb 4.8 oz (114.9 kg)  06/27/21 247 lb (112 kg)  06/26/21 247 lb 9.6 oz (112.3 kg)  12/05/20 267 lb 12.8 oz (121.5 kg)  08/08/20 259 lb 8 oz (117.7 kg)   Lab Results  Component Value Date   HGBA1C 7.0 (A) 10/16/2021   HGBA1C >14.0 (A) 06/26/2021   HGBA1C 7.1 (A) 12/05/2020   HGBA1C >14.0 (A) 07/30/2020   HGBA1C 12.9 (A) 10/10/2019     BP Readings from Last 3 Encounters:  10/16/21 137/79  07/25/21 128/80  07/10/21 (!) 132/59       Diabetes Self-Management Education - 10/16/21 0800       Visit Information   Visit Type 4 Month Follow-Up      Health Coping   How would you rate your overall health? Fair      Complications   Have you had a dilated eye exam in the past 12 months? No    Have you had a dental exam in the past 12 months? No    Are you checking your feet? Yes   supposed to wear brace   How many days per week are you checking your feet? 7       Patient Education   Monitoring Other (comment);Yearly dilated eye exam   on how to start and use a CGM     Patient Self-Evaluation of Goals - Patient rates self as meeting previously set goals (% of time)   Nutrition 50 - 75 %   has not been drinking juice, drinks water instead, coffee and teas   Monitoring 25 - 50%      Post-Education Assessment   Patient understands incorporating nutritional management into lifestyle. Demonstrates understanding / competency    Patient undertands incorporating physical activity into lifestyle. Demonstrates understanding / competency    Patient understands using medications safely. Demonstrates understanding / competency    Patient understands monitoring blood glucose, interpreting and using results Needs Instruction   on cgm today per her request   Patient understands prevention, detection, and treatment of acute complications. Demonstrates understanding / competency    Patient understands prevention, detection, and treatment of chronic complications. Demonstrates understanding / competency      Outcomes   Expected Outcomes Demonstrated interest in learning. Expect positive outcomes    Future DMSE 4-6 wks   when she gts her CGM sensors   Program  Status Re-entered      Subsequent Visit   Since your last visit have you continued or begun to take your medications as prescribed? Yes    Since your last visit have you had your blood pressure checked? No    Since your last visit, are you checking your blood glucose at least once a day? Yes   twice a week            Individualized Plan for Diabetes Self-Management Training:   Learning Objective:  Patient will have a greater understanding of diabetes self-management. Patient education plan is to attend individual and/or group sessions per assessed needs and concerns.   Plan:   Patient Instructions  Your eye exam at [facility] care is March 16 at 930 AM, that is a Thursday  I'll send in the order for  your Continuous glucose monitor. You should get a call from them.  You are going to call [facility] to get a list of covered dentists. Then make an appointment  Keep working on monitoring your blood sugar- as we discussed 6+ times a day.   We can follow up when you get your Continuous glucose monitoring sensors in the mail.   Tina [name] [phone]   Expected Outcomes:  Demonstrated interest in learning. Expect positive outcomes  Education material provided: Diabetes Resources  If problems or questions, patient to contact team via:  Phone  Future DSME appointment: 4-6 wks (when she gts her CGM sensors) Debera Lat, RD 10/16/2021 2:04 PM.

## 2021-10-16 NOTE — Assessment & Plan Note (Signed)
HPI: Hgb A1c 14% at the last visit. Current A1c 7 %.  Patient does not have any reading for evaluation today. She did meet with diabetic educator who placed continuous glucose monitor sensor. She is concerned for weight gain and still on Insulin. Her weight has increased from 247 to 274 in 3 months. Current regimen as listed on last note.  Patient has not had any hypoglycemic episodes.   Assessment/Plan: Type 2 diabetes mellitus with long-term insulin use, chronic problem controlled at today's visit.  We will stop Trulicity today and start Tirzepatide will give increased benefit of weight loss. Will send first month and second month prescription as below. Goal to continue increasing Tirzepatide as tolerating and ultimately I am hopeful we can decrease Insulin.  - Continue Metformin 1000 mg BID -Empagliflozin 10 mg daily -NovoLog 70/30 25 units twice daily - tirzepatide (MOUNJARO) 2.5 MG/0.5ML Pen; Inject 2.5 mg into the skin once a week.  Dispense: 2 mL; Refill: 0 - tirzepatide (MOUNJARO) 5 MG/0.5ML Pen; Inject 5 mg into the skin once a week.  Dispense: 2 mL; Refill: 0 - Follow up in 1 month

## 2021-10-16 NOTE — Telephone Encounter (Signed)
Patient requests referral to PREP exercise program and an order for freestyle libre 2 CGM sensors.  Patient called and given [place] phone number and acknowledges that she has to call [facility] to set up an account.

## 2021-10-16 NOTE — Assessment & Plan Note (Addendum)
HPI: Patient's BP today is 137/79 with a goal of <130/80. The patient endorses adherence to her medication regimen.   Assessment/Plan: HTN, above goal of 130 consistently at repeat visit. Will switch enalapril to olmesartan. Also adding amlodipine and prescribing this in a combination pill.  Stop Enalapril - BMP8+Anion Gap - amLODipine-olmesartan (AZOR) 5-20 MG tablet; Take 1 tablet by mouth daily.  Dispense: 30 tablet; Refill: 1

## 2021-10-17 ENCOUNTER — Other Ambulatory Visit (HOSPITAL_COMMUNITY): Payer: Self-pay

## 2021-10-17 LAB — BMP8+ANION GAP
Anion Gap: 15 mmol/L (ref 10.0–18.0)
BUN/Creatinine Ratio: 14 (ref 9–23)
BUN: 9 mg/dL (ref 6–24)
CO2: 20 mmol/L (ref 20–29)
Calcium: 9.8 mg/dL (ref 8.7–10.2)
Chloride: 104 mmol/L (ref 96–106)
Creatinine, Ser: 0.65 mg/dL (ref 0.57–1.00)
Glucose: 106 mg/dL — ABNORMAL HIGH (ref 70–99)
Potassium: 4.5 mmol/L (ref 3.5–5.2)
Sodium: 139 mmol/L (ref 134–144)
eGFR: 106 mL/min/{1.73_m2} (ref >59)

## 2021-10-17 MED ORDER — FREESTYLE LIBRE 2 SENSOR MISC: 3 refills | Status: DC

## 2021-10-17 NOTE — Telephone Encounter (Signed)
Patient is also interested in a pill pack/ assistance with organizing her medications to make them easier to keep up with . Rec pharmacy consult.

## 2021-10-17 NOTE — Telephone Encounter (Signed)
DECISION :    DENIED after two tries     Reason being pt has not try two of the formulary drug treatments  .. ( PER med list she has been on trulicity ) but not other    List as follows : Bydureon  ( 4 each per 28 DAYS )  Byetta   Soliqua (30ML PER 30 DAYS )  OZEMPIC ( 1.5 ML PER 28 DAYS ) RYBELSUS TABLET ( 30 PER 30 DAYS )   ( ALL BRAND NAME )     Will retry again next week

## 2021-10-18 ENCOUNTER — Other Ambulatory Visit (HOSPITAL_COMMUNITY): Payer: Self-pay

## 2021-10-18 ENCOUNTER — Telehealth: Payer: Self-pay | Admitting: Internal Medicine

## 2021-10-18 MED ORDER — OZEMPIC (0.25 OR 0.5 MG/DOSE) 2 MG/1.5ML ~~LOC~~ SOPN
0.5000 mg | PEN_INJECTOR | SUBCUTANEOUS | 1 refills | Status: DC
  Filled 2021-10-18: qty 1.5, 28d supply, fill #0

## 2021-10-18 NOTE — Telephone Encounter (Signed)
Tirzepatide not covered by [employer]. Sent prescription for Semaglutide and discussed with patient. She has tolerated in the past well, will start at .5 mg dose.

## 2021-10-21 ENCOUNTER — Other Ambulatory Visit (HOSPITAL_COMMUNITY): Payer: Self-pay

## 2021-10-23 ENCOUNTER — Other Ambulatory Visit (HOSPITAL_COMMUNITY): Payer: Self-pay

## 2021-10-24 ENCOUNTER — Telehealth: Payer: Self-pay

## 2021-10-24 NOTE — Telephone Encounter (Signed)
Call placed to patient reference PREP referral  Explained program to pt Interested in participating, needs a daytime class after 830am before 230pm Will call her back when starting next class at [facility]

## 2021-10-29 ENCOUNTER — Telehealth: Payer: Self-pay

## 2021-10-30 NOTE — Telephone Encounter (Signed)
LVMT pt requesting call back reference next PREP class starting on 11/04/21 at [facility].

## 2021-11-04 ENCOUNTER — Other Ambulatory Visit (HOSPITAL_COMMUNITY): Payer: Self-pay

## 2021-11-04 ENCOUNTER — Other Ambulatory Visit: Payer: Self-pay | Admitting: Internal Medicine

## 2021-11-04 DIAGNOSIS — E1165 Type 2 diabetes mellitus with hyperglycemia: Secondary | ICD-10-CM

## 2021-11-05 ENCOUNTER — Other Ambulatory Visit (HOSPITAL_COMMUNITY): Payer: Self-pay

## 2021-11-05 MED ORDER — NOVOLOG MIX 70/30 FLEXPEN (70-30) 100 UNIT/ML ~~LOC~~ SUPN
25.0000 [IU] | PEN_INJECTOR | Freq: Two times a day (BID) | SUBCUTANEOUS | 1 refills | Status: DC
  Filled 2021-11-05: qty 30, 60d supply, fill #0
  Filled 2021-12-24 – 2022-01-06 (×2): qty 15, 30d supply, fill #1
  Filled 2022-03-04 (×2): qty 15, 30d supply, fill #2

## 2021-11-06 ENCOUNTER — Telehealth: Payer: Self-pay | Admitting: Dietician

## 2021-11-06 NOTE — Telephone Encounter (Signed)
Fax number is [phone]. Order faxed successfully to [facility] for CGM.

## 2021-11-07 ENCOUNTER — Ambulatory Visit (HOSPITAL_COMMUNITY): Payer: Medicaid Other | Admitting: Licensed Clinical Social Worker

## 2021-11-08 ENCOUNTER — Other Ambulatory Visit (HOSPITAL_COMMUNITY): Payer: Self-pay

## 2021-11-08 ENCOUNTER — Telehealth: Payer: Self-pay

## 2021-11-08 NOTE — Telephone Encounter (Signed)
Rx sent on 1/3. Spoke with Lyndee Leo at [facility] who states Rx is ready for p/u. Patient notified and is very Patent attorney.

## 2021-11-08 NOTE — Telephone Encounter (Signed)
insulin aspart protamine - aspart (NOVOLOG MIX 70/30 FLEXPEN) (70-30) 100 UNIT/ML FlexPen, refill request @ [facility].

## 2021-11-11 ENCOUNTER — Other Ambulatory Visit (HOSPITAL_COMMUNITY): Payer: Self-pay

## 2021-11-11 ENCOUNTER — Other Ambulatory Visit: Payer: Self-pay

## 2021-11-11 ENCOUNTER — Ambulatory Visit (INDEPENDENT_AMBULATORY_CARE_PROVIDER_SITE_OTHER): Payer: Medicare Other | Admitting: Pharmacist

## 2021-11-11 DIAGNOSIS — Z794 Long term (current) use of insulin: Secondary | ICD-10-CM | POA: Diagnosis not present

## 2021-11-11 DIAGNOSIS — E1169 Type 2 diabetes mellitus with other specified complication: Secondary | ICD-10-CM

## 2021-11-11 MED ORDER — OZEMPIC (1 MG/DOSE) 4 MG/3ML ~~LOC~~ SOPN
1.0000 mg | PEN_INJECTOR | SUBCUTANEOUS | 0 refills | Status: DC
  Filled 2021-11-11: qty 3, 28d supply, fill #0

## 2021-11-11 NOTE — Progress Notes (Signed)
Subjective:    [name], female    DOB: 1971-03-09, 51 y.o.   MRN: [id number]  HPI Patient is a 51 y.o. female who presents for diabetes management. She is in good spirits and presents with assistance of wheelchair and cane. Patient was referred and last seen by Primary Care Provider on 10/16/21.  Patient is currently awaiting Libre CGM supplies. She states she has not received them yet.  Patient reports diabetes was diagnosed around 2018.   Insurance coverage/medication affordability: Medicare A/B + Medicaid  Current diabetes medications include: Jardiance 10mg , metformin 1000mg  twice daily, Novolog 70/30 25 units twice daily, Ozempic 0.5mg  once weekly on Sundays Current hypertension medications include: amlodipine-olmesartan 5-10mg ,  Current hyperlipidemia medications include: atorvastatin 40mg  Patient states that She is taking her medications as prescribed. Patient denies adherence with medications. Patient states that She misses her medications 2 times per week, on average.  Do you feel that your medications are working for you?  yes  Have you been experiencing any side effects to the medications prescribed? no  Do you have any problems obtaining medications due to transportation or finances?  no     Patient reported dietary habits:  Eats 2 meals/day and 1 snacks/day Breakfast: eggs, sausage, muffin or bagel Lunch: salad Dinner: hamburger helper; spaghetti; hot dog Snacks: graham crackers + peanut butter Drinks: water, soda or juice on occasion (2x/week)  Patient-reported exercise habits: denies due to difficulty with mobility   Patient denies hypoglycemic events. Patient denies polyuria (increased urination).  Patient denies polyphagia (increased appetite).  Patient denies polydipsia (increased thirst).  Patient reports neuropathy (nerve pain). Patient denies visual changes. Patient reports self foot exams.   Home fasting blood sugars:  145, 150, 132   Objective:   Labs:   Physical Exam Neurological:     Mental Status: She is alert and oriented to person, place, and time.    Review of Systems  Gastrointestinal:  Negative for nausea and vomiting.   Lab Results  Component Value Date   HGBA1C 7.0 (A) 10/16/2021   HGBA1C >14.0 (A) 06/26/2021   HGBA1C 7.1 (A) 12/05/2020    There were no vitals filed for this visit.  Lab Results  Component Value Date   MICRALBCREAT 4 08/08/2020    Lipid Panel     Component Value Date/Time   CHOL 154 08/08/2020 0945   TRIG 142 08/08/2020 0945   HDL 35 (L) 08/08/2020 0945   CHOLHDL 4.4 08/08/2020 0945   CHOLHDL 7.9 08/21/2014 1512   VLDL 50 (H) 08/21/2014 1512   LDLCALC 94 08/08/2020 0945    Clinical Atherosclerotic Cardiovascular Disease (ASCVD): No  The ASCVD Risk score (Arnett DK, et al., 2019) failed to calculate for the following reasons:   Unable to determine if patient is Non-Hispanic African American    Assessment/Plan:   T2DM is controlled likely due to medication adherence. As patient is tolerating Ozempic 0.5mg  well will increase dose to 1mg  upon successful completion of fourth dose this upcoming Sunday. Will work towards titrating up [place] to maximum dose of 2mg  and can also consider increasing Jardiance to max of 25mg  with the possibility of decrease insulin dosage. Following instruction patient verbalized understanding of treatment plan.   Continued Novolog 70/30 25 units twice daily Increased dose of GLP-1 Ozempic to 1mg  once weekly Continued SGLT2-I Jardiance 10mg  once daily Continued metformin 1000mg  twice daily Extensively discussed pathophysiology of diabetes, dietary effects on blood sugar control, and recommended lifestyle interventions. Patient will adhere  to dietary modifications of decreasing soft drink intake Counseled on s/sx of and management of hypoglycemia Next A1C anticipated March 2023.   Follow-up appointment one month to review  sugar readings. Written patient instructions provided.  This appointment required 30 minutes of direct patient care.  Thank you for involving pharmacy to assist in providing this patient's care.

## 2021-11-11 NOTE — Assessment & Plan Note (Signed)
T2DM is controlled likely due to medication adherence. As patient is tolerating Ozempic 0.5mg  well will increase dose to 1mg  upon successful completion of fourth dose this upcoming Sunday. Will work towards titrating up [place] to maximum dose of 2mg  and can also consider increasing Jardiance to max of 25mg  with the possibility of decrease insulin dosage. Following instruction patient verbalized understanding of treatment plan.   1. Continued Novolog 70/30 25 units twice daily 2. Increased dose of GLP-1 Ozempic to 1mg  once weekly 3. Continued SGLT2-I Jardiance 10mg  once daily 4. Continued metformin 1000mg  twice daily 5. Extensively discussed pathophysiology of diabetes, dietary effects on blood sugar control, and recommended lifestyle interventions. 6. Patient will adhere to dietary modifications of decreasing soft drink intake 7. Counseled on s/sx of and management of hypoglycemia 8. Next A1C anticipated March 2023.   Follow-up appointment one month to review sugar readings.

## 2021-11-11 NOTE — Patient Instructions (Signed)
Tina [name] it was a pleasure seeing you today.   Please do the following:  Increase Ozempic to 1mg  once weekly once you complete four doses of 0.5mg  as directed today during your appointment. If you have any questions or if you believe something has occurred because of this change, call me or your doctor to let one of [place] know.  Continue checking blood sugars at home. It's really important that you record these and bring these in to your next doctor's appointment.  Continue making the lifestyle changes we've discussed together during our visit. Diet and exercise play a significant role in improving your blood sugars.  Follow-up with me in one month.    Hypoglycemia or low blood sugar:   Low blood sugar can happen quickly and may become an emergency if not treated right away.   While this shouldn't happen often, it can be brought upon if you skip a meal or do not eat enough. Also, if your insulin or other diabetes medications are dosed too high, this can cause your blood sugar to go to low.   Warning signs of low blood sugar include: Feeling shaky or dizzy Feeling weak or tired  Excessive hunger Feeling anxious or upset  Sweating even when you aren't exercising  What to do if I experience low blood sugar? Follow the Rule of 15 Check your blood sugar with your meter. If lower than 70, proceed to step 2.  Treat with 15 grams of fast acting carbs which is found in 3-4 glucose tablets. If none are available you can try hard candy, 1 tablespoon of sugar or honey,4 ounces of fruit juice, or 6 ounces of REGULAR soda.  Re-check your sugar in 15 minutes. If it is still below 70, do what you did in step 2 again. If your blood sugar has come back up, go ahead and eat a snack or small meal made up of complex carbs (ex. Whole grains) and protein at this time to avoid recurrence of low blood sugar.

## 2021-11-19 ENCOUNTER — Other Ambulatory Visit (HOSPITAL_COMMUNITY): Payer: Self-pay

## 2021-11-25 ENCOUNTER — Other Ambulatory Visit (HOSPITAL_COMMUNITY): Payer: Self-pay

## 2021-11-28 ENCOUNTER — Telehealth: Payer: Self-pay | Admitting: Dietician

## 2021-11-29 ENCOUNTER — Other Ambulatory Visit (HOSPITAL_COMMUNITY): Payer: Self-pay

## 2021-12-02 ENCOUNTER — Other Ambulatory Visit (HOSPITAL_COMMUNITY): Payer: Self-pay

## 2021-12-05 NOTE — Telephone Encounter (Signed)
Patient scheduled for Continuous glucose monitoring training. Debera Lat, RD 12/05/2021 3:51 PM.

## 2021-12-06 ENCOUNTER — Telehealth: Payer: Self-pay

## 2021-12-06 NOTE — Telephone Encounter (Signed)
Call placed to pt reference next PREP class starting on 12/23/21 at [facility] M/W [id number]  Patient agreeable to starting then Intake scheduled for 12/18/21 at 11am  Will meet pt at [facility] in lobby

## 2021-12-09 ENCOUNTER — Encounter: Payer: Medicare Other | Admitting: Pharmacist

## 2021-12-09 ENCOUNTER — Ambulatory Visit (INDEPENDENT_AMBULATORY_CARE_PROVIDER_SITE_OTHER): Payer: 59 | Admitting: Clinical

## 2021-12-09 DIAGNOSIS — F3181 Bipolar II disorder: Secondary | ICD-10-CM | POA: Diagnosis not present

## 2021-12-09 NOTE — Progress Notes (Signed)
° °  THERAPIST PROGRESS NOTE Virtual Visit via Telephone Note  I connected with Tina [name] on 12/09/21 at  9:00 AM EST by telephone and verified that I am speaking with the correct person using two identifiers.  Location: Patient: home Provider: office   I discussed the limitations, risks, security and privacy concerns of performing an evaluation and management service by telephone and the availability of in person appointments. I also discussed with the patient that there may be a patient responsible charge related to this service. The patient expressed understanding and agreed to proceed.   Follow Up Instructions: I discussed the assessment and treatment plan with the patient. The patient was provided an opportunity to ask questions and all were answered. The patient agreed with the plan and demonstrated an understanding of the instructions.   The patient was advised to call back or seek an in-person evaluation if the symptoms worsen or if the condition fails to improve as anticipated.   Session Time: 30 minutes  Participation Level: Active  Behavioral Response: CasualAlertEuthymic  Type of Therapy: Individual Therapy  Treatment Goals addressed: Coping  Interventions: CBT and Supportive  Summary:  Tina [name] is a 51 y.o. female who presents for the scheduled session oriented times five, appropriately dressed, and friendly. Client presents for initial appointment with this therapist. Client reported she would like to transfer. Client reported that she is not sleeping well and has spoken with her psychiatrist about the ongoing issue. Client reported her psychiatrist has decided to refer her for a sleep study to determine the cause of her insomnia. Client reported otherwise she is doing well with medication compliance. Client reported she has been working through the grief of her mother since March 2022. 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 Client reported her mother died while in her presence. Client reported her mother was the glue that kept the family together. Client reported once her mother passed she and her sibling shave not been in contact. Client reported she lives with her brother in a five bed room house. Client reported he does not talk to her and she rents a room using her money for disability. Client reported she has a hard time with irritability/ anger. Client reported her irritability also comes from her chronic pain in her foot which make sit hard for her to walk. Client reported her doctor helps manage the pain with heating pads, OTC medication, and topical rubs. Client reported she has been told she has a "nasty attitude". Client reported she wants to continue working on her grief and better managing her irritability.   Suicidal/Homicidal: Nowithout intent/plan  Therapist Response:  Therapist began the session making introduction and discussing confidentiality. Therapist used CBT to engage and ask the client about her mental health history. Therapist used CBT to engage client to have her identify other external stressors that affect her symptoms currently. Therapist used CBT to engage with the client to discuss current symptoms she is still struggling with that she would like to work on. Therapist CBT to complete chronic treatment plan. Client will schedule her next appointment.     Plan: Return again in 5 weeks.  Diagnosis: Bipolar 2 disorder, major depressive episode    Tina Finkel Y Maxfield Gildersleeve, LCSW 12/09/2021

## 2021-12-10 ENCOUNTER — Other Ambulatory Visit (HOSPITAL_COMMUNITY): Payer: Self-pay

## 2021-12-12 ENCOUNTER — Other Ambulatory Visit (HOSPITAL_COMMUNITY): Payer: Self-pay

## 2021-12-16 ENCOUNTER — Other Ambulatory Visit: Payer: Self-pay

## 2021-12-16 ENCOUNTER — Ambulatory Visit (INDEPENDENT_AMBULATORY_CARE_PROVIDER_SITE_OTHER): Payer: 59 | Admitting: *Deleted

## 2021-12-16 DIAGNOSIS — Z3042 Encounter for surveillance of injectable contraceptive: Secondary | ICD-10-CM

## 2021-12-16 MED ORDER — MEDROXYPROGESTERONE ACETATE 104 MG/0.65ML ~~LOC~~ SUSY
104.0000 mg | PREFILLED_SYRINGE | Freq: Once | SUBCUTANEOUS | Status: AC
  Administered 2021-12-16: 104 mg via SUBCUTANEOUS

## 2021-12-17 ENCOUNTER — Ambulatory Visit (INDEPENDENT_AMBULATORY_CARE_PROVIDER_SITE_OTHER): Payer: 59 | Admitting: Podiatry

## 2021-12-17 ENCOUNTER — Other Ambulatory Visit (HOSPITAL_COMMUNITY): Payer: Self-pay

## 2021-12-17 DIAGNOSIS — E1149 Type 2 diabetes mellitus with other diabetic neurological complication: Secondary | ICD-10-CM | POA: Diagnosis not present

## 2021-12-17 DIAGNOSIS — Q828 Other specified congenital malformations of skin: Secondary | ICD-10-CM

## 2021-12-17 MED ORDER — MELOXICAM 15 MG PO TABS
15.0000 mg | ORAL_TABLET | Freq: Every day | ORAL | 0 refills | Status: DC
  Filled 2021-12-17: qty 30, 30d supply, fill #0

## 2021-12-17 NOTE — Progress Notes (Signed)
Subjective: 51 year old female presents the office today for concerns of painful calluses on both of her feet.  She states they are starting to cause discomfort with walking.  Denies any swelling redness or any drainage.  Also asking for prescription meloxicam given the right ankle.  Has been on ibuprofen not helping much and she wants to try meloxicam.  No recent injury or falls or changes otherwise.  Last A1c is improved to 7 on October 16, 2021  Objective: AAO x3, NAD DP/PT pulses palpable bilaterally, CRT less than 3 seconds Significant bunions are present bilateral with hammertoes deformities bilaterally.  Flatfoot is present.  Hyperkeratotic lesion sulcus of the right first interspace, left submetatarsal 2 as well as fifth metatarsal head on the left foot.  There is no underlying ulceration drainage or any signs of infection.  There is no ulcerations. Mild diffuse discomfort of the ankle with mild swelling.  Most of her discomfort with ambulation.  Previous triple arthrodesis noted. No pain with calf compression, swelling, warmth, erythema  Assessment: Right ankle arthritis, flatfoot; hyperkeratotic lesions  Plan: -All treatment options discussed with the patient including all alternatives, risks, complications.  -Sharply debrided hyperkeratotic lesions x3 without any complications or bleeding -Prescribed meloxicam but advised to hold off on any other anti-inflammatories. -Continue with bracing, supportive shoe gear. -Her blood sugar is much better controlled. -Patient encouraged to call the office with any questions, concerns, change in symptoms.

## 2021-12-18 NOTE — Progress Notes (Signed)
[facility] PREP Evaluation  Patient Details  Name: Tina [name] MRN: [id number] Date of Birth: April 08, 1971 Age: 51 y.o. PCP: [name], MD  Vitals:   12/18/21 1100  BP: 122/82  Pulse: 92  SpO2: 96%  Weight: 279 lb 9.6 oz (126.8 kg)     [facility] Eval - 12/18/21 1200       [facility] "PREP" Location   [facility] "PREP" Location [facility]      Referral    Referring Provider [facility]    Reason for referral Diabetes;Hypertension;Inactivity;High Cholesterol    Program Start Date 12/23/21   M/W 1030am to 1145am x 12 wks     Measurement   Waist Circumference 48.5 inches    Hip Circumference 51 inches    Body fat 40 percent      Information for Trainer   Goals manage stress, anxiety and depression, lower a1c, tone    Current Exercise 1x wk cardio    Orthopedic Concerns low back pain, sciatica, ankle pain    Pertinent Medical History IDDM, a1c 7, htn, high chol    Current Barriers none    Restrictions/Precautions Diabetic snack before exercise   eat breakfast before class   Medications that affect exercise Medication causing dizziness/drowsiness      Timed Up and Go (TUGS)   Timed Up and Go Low risk <9 seconds   moves easily, last fall (tripped) a year ago     Mobility and Daily Activities   I find it easy to walk up or down two or more flights of stairs. 1    I have no trouble taking out the trash. 1    I do housework such as vacuuming and dusting on my own without difficulty. 1    I can easily lift a gallon of milk (8lbs). 4    I can easily walk a mile. 1    I have no trouble reaching into high cupboards or reaching down to pick up something from the floor. 2    I do not have trouble doing out-door work such as Armed forces logistics/support/administrative officer, raking leaves, or gardening. 1      Mobility and Daily Activities   I feel younger than my age. 2    I feel independent. 2    I feel energetic. 1    I live an active life.  2    I feel strong. 2    I feel healthy. 1    I feel active as other  people my age. 1      How fit and strong are you.   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

## 2021-12-19 ENCOUNTER — Other Ambulatory Visit (HOSPITAL_COMMUNITY): Payer: Self-pay

## 2021-12-24 ENCOUNTER — Ambulatory Visit: Payer: 59 | Admitting: Dietician

## 2021-12-24 ENCOUNTER — Other Ambulatory Visit: Payer: Self-pay

## 2021-12-24 ENCOUNTER — Encounter: Payer: Self-pay | Admitting: Dietician

## 2021-12-24 ENCOUNTER — Other Ambulatory Visit (HOSPITAL_COMMUNITY): Payer: Self-pay

## 2021-12-24 NOTE — Progress Notes (Signed)
° °  Diabetes Self-Management Education  Visit Type:  follow up   [name], identified by name and date of birth, is a 50 y.o. female with a diagnosis of Diabetes:  type 2.   ASSESSMENT  [employer] Personal CGM Training  Start time:905    End time: 930 Total time: 25 [employer] was educated about the following:  -Getting to know device    (Receiver/phone programmed ) -Setting up device , trend arrows - sensor interaction with vitamin C: do not take more than 500 mg vitamin C daily -Setting alert profile (high alert  240 , low alert 70)  setting up reminders (reminders for 6x/day testing  ) -Inserting sensor (patient applied the sensor on his left upper back of arm  himself today with minimal assist. It appeared to be working and in warm up when he left the office) -Calibrating- none required. - using meter when test blood sugar symbol appears -Ending sensor session -Trouble shooting -Tape guide, ability to upload from home with email address (he says he nor wife do email)  -Reviewed insulin dosing from [place]  Patient has [employer] tech support and my contact information.    Individualized Plan for Diabetes Self-Management Training:   Learning Objective:  Patient will have a greater understanding of diabetes self-management. Patient education plan is to attend individual and/or group sessions per assessed needs and concerns.   Plan:   Patient Instructions  You did great starting your sensor.   Stop at the front desk to make an appointment for 2 weeks.   Bring your phone and a new sensor to place.   Check your blood sugar at least every 4-6 hours especially before bed and upon waking.   Check blood sugar with CGM= swiping the reader near your sensor  Tina [name] [phone]   Expected Outcomes:     Education material provided: Diabetes Resources  If problems or questions, patient to contact team via:  Phone  Future  DSME appointment:   2 weeks  Tina [name], RD 12/24/2021 12:56 PM.       Johnette Abraham

## 2021-12-24 NOTE — Patient Instructions (Signed)
You did great starting your sensor.   Stop at the front desk to make an appointment for 2 weeks.   Bring your phone and a new sensor to place.   Check your blood sugar at least every 4-6 hours especially before bed and upon waking.   Check blood sugar with CGM= swiping the reader near your sensor  Butch Penny [phone]

## 2021-12-25 ENCOUNTER — Other Ambulatory Visit (HOSPITAL_COMMUNITY): Payer: Self-pay

## 2021-12-25 MED ORDER — OZEMPIC (1 MG/DOSE) 4 MG/3ML ~~LOC~~ SOPN
1.0000 mg | PEN_INJECTOR | SUBCUTANEOUS | 2 refills | Status: DC
  Filled 2021-12-25: qty 3, 28d supply, fill #0
  Filled 2022-02-07: qty 3, 28d supply, fill #1
  Filled 2022-03-27: qty 3, 28d supply, fill #2

## 2021-12-27 ENCOUNTER — Other Ambulatory Visit (HOSPITAL_COMMUNITY): Payer: Self-pay

## 2021-12-27 ENCOUNTER — Encounter (HOSPITAL_COMMUNITY): Payer: Self-pay | Admitting: Psychiatry

## 2021-12-27 ENCOUNTER — Telehealth (INDEPENDENT_AMBULATORY_CARE_PROVIDER_SITE_OTHER): Payer: 59 | Admitting: Psychiatry

## 2021-12-27 DIAGNOSIS — F17200 Nicotine dependence, unspecified, uncomplicated: Secondary | ICD-10-CM

## 2021-12-27 DIAGNOSIS — F411 Generalized anxiety disorder: Secondary | ICD-10-CM | POA: Diagnosis not present

## 2021-12-27 DIAGNOSIS — F5105 Insomnia due to other mental disorder: Secondary | ICD-10-CM | POA: Diagnosis not present

## 2021-12-27 DIAGNOSIS — F3181 Bipolar II disorder: Secondary | ICD-10-CM

## 2021-12-27 DIAGNOSIS — F99 Mental disorder, not otherwise specified: Secondary | ICD-10-CM

## 2021-12-27 MED ORDER — LAMOTRIGINE 100 MG PO TABS
100.0000 mg | ORAL_TABLET | Freq: Every day | ORAL | 2 refills | Status: DC
Start: 2021-12-27 — End: 2022-05-02
  Filled 2021-12-27 – 2022-01-22 (×2): qty 30, 30d supply, fill #0
  Filled 2022-03-04: qty 30, 30d supply, fill #1
  Filled 2022-04-10: qty 30, 30d supply, fill #2

## 2021-12-27 MED ORDER — BUSPIRONE HCL 15 MG PO TABS
15.0000 mg | ORAL_TABLET | Freq: Three times a day (TID) | ORAL | 3 refills | Status: DC
  Filled 2021-12-27 – 2022-02-07 (×2): qty 90, 30d supply, fill #0
  Filled 2022-04-10: qty 90, 30d supply, fill #1

## 2021-12-27 MED ORDER — ARIPIPRAZOLE 20 MG PO TABS
20.0000 mg | ORAL_TABLET | Freq: Every day | ORAL | 3 refills | Status: DC
  Filled 2021-12-27: qty 30, 30d supply, fill #0
  Filled 2022-03-27: qty 30, 30d supply, fill #1
  Filled 2022-04-27: qty 30, 30d supply, fill #2

## 2021-12-27 MED ORDER — NICOTINE POLACRILEX 4 MG MT GUM
4.0000 mg | CHEWING_GUM | OROMUCOSAL | 3 refills | Status: DC | PRN
Start: 2021-12-27 — End: 2022-05-02
  Filled 2021-12-27: qty 110, 11d supply, fill #0
  Filled 2022-04-27: qty 110, 11d supply, fill #1

## 2021-12-27 MED ORDER — ZOLPIDEM TARTRATE 5 MG PO TABS
5.0000 mg | ORAL_TABLET | Freq: Every evening | ORAL | 2 refills | Status: DC | PRN
  Filled 2021-12-27: qty 30, 30d supply, fill #0
  Filled 2022-04-10: qty 30, 30d supply, fill #1

## 2021-12-27 NOTE — Progress Notes (Signed)
[facility] MD/PA/NP OP Progress Note Virtual Visit via Telephone Note  I connected with Tina [name] on 12/27/21 at  9:00 AM EST by telephone and verified that I am speaking with the correct person using two identifiers.  Location: Patient: home Provider: Clinic   I discussed the limitations, risks, security and privacy concerns of performing an evaluation and management service by telephone and the availability of in person appointments. I also discussed with the patient that there may be a patient responsible charge related to this service. The patient expressed understanding and agreed to proceed.   I provided 30 minutes of non-face-to-face time during this encounter.   12/27/2021 9:10 AM Tina [name]  MRN:  [id number]  Chief Complaint: "The voices are not good"  HPI: 51 year old female seen today for follow up psychiatric evaluation.   She has a psychiatric history of anxiety, depression, insomnia, tobacco use, and bipolar 2 disorder.  She is currently managed on Lamictal 100 mg daily,  Zoloft 50 mg daily, Abilify 10 mg daily,Belsomra 10 mg, and BuSpar 15 mg 3 times daily.  Patient also takes gabapentin 300 mg 3 times daily which she received from her PCP.   She notes that she was instructed by her PCP to discontinue Zoloft while on Mobic and reports that she also discontinued Belsomra as it was ineffective in managing her sleep.  Today she reports that her medications are somewhat effective in managing her psychiatric conditions.    Today patient unable to log on virtually so the call was done over the phone. During assessment she was pleasant, cooperative, and engaged in conversation.  She informed provider her voices are not good.  She notes that her voices has told her to kill herself and screams demeaning things like F!!! you.  She notes that she also sees deceased people.  Patient notes that her sleep is still poor.  She informed Probation officer that her sleep study that  she was referred to by Probation officer at last visit is not until March 16.  She informed Probation officer she sleeps approximately 1 hour nightly.  Provider informed patient that due to lack of sleep is potentially exacerbating her psychosis.  She endorsed understanding and agreed.  She notes that her voices makes her feel paranoid and reports that she is constantly looking over her shoulders.    She informed Probation officer that the above exacerbates her anxiety and depression.  Provider conducted a GAD-7 and patient scored a 19, at her last visit she scored a 15.  Provider also conducted PHQ-9 patient scored a 21, at her last visit she scored a 9.  She notes that her appetite has been decreased and reports that she is lost about 10 pounds.  She did Artist that her doctor has her on a diet and reports that she tries to go to the gym frequently.  Today she endorses passive SI however notes that she would not harm herself.  She denies SI/HI.  Due to lack of sleep patient notes that her mind races, she is distractible, irritable, and notes that at times she drives recklessly.    To cope with above patient notes that she smokes tobacco daily.  She denies alcohol or illegal drug use.  Patient notes that she continues to be in pain most days.  She notes that she has calluses on her feet and bone on bone pain in her ankles.  She informed Probation officer that her orthopedic doctor talked about surgery however she is not agreeable at  this time for surgery.  She notes that her pain is somewhat managed with Mobic, Tylenol, and gabapentin.  Today she is agreeable to increase Abilify 10 mg to 20 mg to help manage psychosis and mood. She is also agreeable to starting Ambien 5 mg to help manage sleep.  At this time she will not restart Belsomra or Zoloft.  Provider discussed starting another antipsychotic at her next visit if psychosis does not improve.  Provider discussed starting potentially Lybalvi, Zyprexa, Seroquel, or Vraylar.  Patient reports  that she has tried Risperdal in the past without success.  She informed Probation officer that she would like to be on antipsychotic that does not cause weight gain.  Provider endorsed understanding and noted that Lybalvi may be a better option in the future however cannot guarantee no weight gain.  She endorsed understanding and agreed.  Potential side effects of medication and risks vs benefits of treatment vs non-treatment were explained and discussed. All questions were answered.Patient will follow-up with outpatient counseling for therapy.  No other concerns at this time.      Visit Diagnosis:    ICD-10-CM   1. Insomnia due to other mental disorder  F51.05 zolpidem (AMBIEN) 5 MG tablet   F99     2. Bipolar 2 disorder, major depressive episode (HCC)  F31.81 ARIPiprazole (ABILIFY) 20 MG tablet    lamoTRIgine (LAMICTAL) 100 MG tablet    3. Generalized anxiety disorder  F41.1 busPIRone (BUSPAR) 15 MG tablet    4. 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    Family Psychiatric History: Niece Schizophrenia  Family History:  Family History  Problem Relation Age of Onset   Colon cancer Sister    Diabetes Mother    Diabetes Father    Diabetes Brother    Celiac disease Paternal Aunt     Social History:  Social History   Socioeconomic History   Marital status: Single    Spouse name: Not on file    Number of children: 0   Years of education: 12   Highest education level: Some college, no degree  Occupational History   Occupation: Engineer, materials: UNEMPLOYED    Employer: Engineer, [employer]  Tobacco Use   Smoking status: Every Day    Packs/day: 0.50    Years: 23.00    Pack years: 11.50    Types: Cigarettes   Smokeless tobacco: Never   Tobacco comments:    1/2PPD  Vaping Use   Vaping Use: Never used  Substance and Sexual Activity   Alcohol use: Yes    Alcohol/week: 0.0 standard drinks    Comment: occ   Drug use: No   Sexual activity: Yes    Partners: Female    Birth control/protection: Injection    Comment:  Depo  Other Topics Concern   Not on file  Social History Narrative   Homosexual; in a monogamous relationship w/ homosexual woman.   She is disabled.  She last worked in 2018 at [employer].    Right handed   One story home   Social Determinants of Health   Financial Resource Strain: Not on file  Food Insecurity: Not on file  Transportation Needs: Not on file  Physical Activity: Not on file  Stress: Not on file  Social Connections: Not on file    Allergies:  Allergies  Allergen Reactions   Codeine Other (See Comments)    hallucinations    Metabolic Disorder Labs: Lab Results  Component Value Date   HGBA1C 7.0 (A) 10/16/2021   MPG 200 05/25/2017   MPG 237 02/26/2016   No results found for: PROLACTIN Lab Results  Component Value Date   CHOL 154 08/08/2020   TRIG 142 08/08/2020   HDL 35 (L) 08/08/2020   CHOLHDL 4.4 08/08/2020   VLDL 50 (H) 08/21/2014   LDLCALC 94 08/08/2020   LDLCALC 96 01/07/2019   Lab Results  Component Value Date   TSH 1.074 06/27/2011    Therapeutic Level Labs: No results found for: LITHIUM No results found for: VALPROATE No components found for:  CBMZ  Current Medications: Current Outpatient Medications  Medication Sig Dispense Refill   nicotine polacrilex (NICORETTE) 4 MG gum Take 1 each (4 mg total) by mouth as  needed for smoking cessation. 100 tablet 3   zolpidem (AMBIEN) 5 MG tablet Take 1 tablet (5 mg total) by mouth at bedtime as needed for sleep. 30 tablet 2   amLODipine-olmesartan (AZOR) 5-20 MG tablet Take 1 tablet by mouth daily. 30 tablet 1   ARIPiprazole (ABILIFY) 20 MG tablet Take 1 tablet (20 mg total) by mouth daily. 30 tablet 3   atorvastatin (LIPITOR) 40 MG tablet Take 1 tablet (40 mg total) by mouth daily. 30 tablet 11   Blood Glucose Monitoring Suppl (CONTOUR NEXT ONE) KIT 1 each by Does not apply route See admin instructions. USE TO CHECK BLOOD GLUCOSE ONCE DAILY. IM PROGRAM. 1 kit 0   busPIRone (BUSPAR) 15 MG tablet Take 1 tablet (15 mg total) by mouth 3 (three) times daily. 90 tablet 3   Continuous Blood Gluc Sensor (FREESTYLE LIBRE 2 SENSOR) MISC Check blood sugar at least 6 times a day (Patient not taking: Reported on 11/11/2021) 6 each 3   empagliflozin (JARDIANCE) 10 MG TABS tablet Take 1 tablet (10 mg total) by mouth daily before breakfast. 90 tablet 1   gabapentin (NEURONTIN) 300 MG capsule TAKE 1 CAPSULE BY MOUTH 3 TIMES DAILY FOR 3 DAYS, THEN 2 CAPSULES BY MOUTH 3 TIMES DAILY. 549 capsule 0   glucose blood (CONTOUR NEXT TEST) test strip Please use to check your sugar 3 times a day. 100 strip 3   insulin aspart protamine - aspart (NOVOLOG MIX 70/30 FLEXPEN) (70-30) 100 UNIT/ML FlexPen Inject 25 Units into the skin 2 (two) times daily. 30 mL 1   Insulin Pen Needle 32G X 4 MM MISC Use daily at 6pm 100 each 4   lamoTRIgine (LAMICTAL) 100 MG tablet Take 1 tablet (100 mg total) by mouth daily. 30 tablet 2   Lancets MISC 1 Units by Does not apply route 3 (three) times daily. 100 each 3   meloxicam (MOBIC) 15 MG tablet Take 1 tablet (15 mg total) by mouth daily. 30 tablet 0   metFORMIN (GLUCOPHAGE) 1000 MG tablet  Take 1 tablet (1,000 mg total) by mouth 2 (two) times daily with a meal. 180 tablet 1   Multiple Vitamins-Minerals (MULTIVITAMIN WITH MINERALS) tablet Take 1 tablet by mouth  daily.     Semaglutide, 1 MG/DOSE, (OZEMPIC, 1 MG/DOSE,) 4 MG/3ML SOPN Inject 1 mg into the skin once a week. 3 mL 2   No current facility-administered medications for this visit.     Musculoskeletal: Strength & Muscle Tone:  Unable to assess due to telephone visit Gait & Station:  Unable to assess due to telephone visit Patient leans: N/A  Psychiatric Specialty Exam: Review of Systems  There were no vitals taken for this visit.There is no height or weight on file to calculate BMI.  General Appearance:  Unable to assess due to telephone visit  Eye Contact:   Unable to assess due to telephone visit  Speech:  Clear and Coherent and Normal Rate  Volume:  Normal  Mood:  Anxious and Depressed  Affect:  Appropriate and Congruent  Thought Process:  Coherent, Goal Directed and Linear  Orientation:  Full (Time, Place, and Person)  Thought Content: Logical, Hallucinations: Auditory Visual, and Paranoid Ideation   Suicidal Thoughts:  Yes.  without intent/plan  Homicidal Thoughts:  No  Memory:  Immediate;   Good Recent;   Good Remote;   Good  Judgement:  Good  Insight:  Good  Psychomotor Activity:  Normal  Concentration:  Concentration: Good and Attention Span: Good  Recall:  Good  Fund of Knowledge: Good  Language: Good  Akathisia:  No  Handed:  Right  AIMS (if indicated):Not done  Assets:  Communication Skills Desire for Improvement Financial Resources/Insurance Housing Social Support  ADL's:  Intact  Cognition: WNL  Sleep:  Poor   Screenings: GAD-7    Flowsheet Row Video Visit from 12/27/2021 in [facility] Video Visit from 10/11/2021 in [facility] Counselor from 09/11/2021 in [facility] Counselor from 08/27/2021 in [facility] Video Visit from 07/12/2021 in [facility]  Total GAD-7 Score _0 XBM8-4    Flowsheet  Row Video Visit from 12/27/2021 in [facility] Office Visit from 10/16/2021 in [place] Video Visit from 10/11/2021 in [facility] Counselor from 09/11/2021 in [facility] Counselor from 08/27/2021 in [place]  PHQ-2 Total Score _1 PHQ-9 Total Score _2 Flowsheet Row Video Visit from 12/27/2021 in [facility] Video Visit from 10/11/2021 in [facility] Counselor from 09/11/2021 in [place] Error: Q7 should not be populated when Q6 is No Error: Question 6 not populated Low Risk        Assessment and Plan: Patient endorses symptoms of anxiety, depression, psychosis, paranoia, and insomnia.  Today she is agreeable to increase Abilify 10 mg to 20 mg to help manage psychosis and mood. She is also agreeable to starting Ambien 5 mg to help manage sleep.  At this time she will not restart Belsomra or Zoloft.  Provider discussed starting another antipsychotic at her next visit if psychosis does not improve.  Provider discussed starting potentially Lybalvi, Zyprexa, Seroquel, or Vraylar.  Patient reports that she has tried Risperdal in the past without  success.  She informed Probation officer that she would like to be on antipsychotic that does not cause weight gain.  Provider endorsed understanding and noted that Lybalvi may be a better option in the future however cannot guarantee no weight gain.  She endorsed understanding and agreed.  Patient will also start Nicorette 4 mg gum to help with tobacco dependence.  1. Bipolar 2 disorder, major depressive episode (HCC)  Increased- ARIPiprazole (ABILIFY) 20 MG tablet; Take 1 tablet (20 mg total) by mouth daily.  Dispense: 30 tablet; Refill: 3 Continue- lamoTRIgine (LAMICTAL) 100 MG tablet; Take 1  tablet (100 mg total) by mouth daily.  Dispense: 30 tablet; Refill: 2  2. Generalized anxiety disorder  Continue- busPIRone (BUSPAR) 15 MG tablet; Take 1 tablet (15 mg total) by mouth 3 (three) times daily.  Dispense: 90 tablet; Refill: 3  3. Insomnia due to other mental disorder  Start- zolpidem (AMBIEN) 5 MG tablet; Take 1 tablet (5 mg total) by mouth at bedtime as needed for sleep.  Dispense: 30 tablet; Refill: 2  4. Tobacco use disorder  Start- nicotine polacrilex (NICORETTE) 4 MG gum; Take 1 each (4 mg total) by mouth as needed for smoking cessation.  Dispense: 100 tablet; Refill: 3    Follow up in 3 month  Follow up with therapy Salley Slaughter, NP 12/27/2021, 9:10 AM

## 2021-12-30 ENCOUNTER — Other Ambulatory Visit (HOSPITAL_COMMUNITY): Payer: Self-pay

## 2021-12-31 ENCOUNTER — Other Ambulatory Visit (HOSPITAL_COMMUNITY): Payer: Self-pay

## 2022-01-02 NOTE — Progress Notes (Signed)
[facility] PREP Weekly Session  Patient Details  Name: Tina [name] MRN: [id number] Date of Birth: Sep 18, 1971 Age: 51 y.o. PCP: [name], MD  Vitals:   12/30/21 2230  Weight: 285 lb 3.2 oz (129.4 kg)     [facility] Weekly seesion - 01/02/22 1700       [facility] "PREP" Location   [facility] "PREP" Location [facility]      Weekly Session   Topic Discussed Importance of resistance training;Other ways to be active    Minutes exercised this week 45 minutes    Classes attended to date [place] 01/02/2022, 5:19 PM

## 2022-01-03 ENCOUNTER — Other Ambulatory Visit: Payer: Self-pay

## 2022-01-03 ENCOUNTER — Other Ambulatory Visit (HOSPITAL_BASED_OUTPATIENT_CLINIC_OR_DEPARTMENT_OTHER): Payer: Self-pay

## 2022-01-03 ENCOUNTER — Other Ambulatory Visit (HOSPITAL_COMMUNITY): Payer: Self-pay

## 2022-01-03 DIAGNOSIS — E1165 Type 2 diabetes mellitus with hyperglycemia: Secondary | ICD-10-CM

## 2022-01-06 ENCOUNTER — Other Ambulatory Visit: Payer: Self-pay | Admitting: Internal Medicine

## 2022-01-06 ENCOUNTER — Other Ambulatory Visit (HOSPITAL_COMMUNITY): Payer: Self-pay

## 2022-01-06 DIAGNOSIS — I10 Essential (primary) hypertension: Secondary | ICD-10-CM

## 2022-01-06 NOTE — Progress Notes (Signed)
[facility] PREP Weekly Session ? ?Patient Details  ?Name: Tina [name] ?MRN: [id number] ?Date of Birth: 1971-01-05 ?Age: 51 y.o. ?PCP: [name], MD ? ?Vitals:  ? 01/06/22 1204  ?Weight: 283 lb 3.2 oz (128.5 kg)  ? ? ? [facility] Weekly seesion - 01/06/22 1200   ? ?  ? [facility]PREP[facility] Location  ? [facility] [place]   ?  ? Weekly Session  ? Topic Discussed Healthy eating tips   sugar demo  ? Minutes exercised this week 20 minutes   ? Classes attended to date 4   ? ?  ?  ? ?  ? ? ?Barnett Hatter ?01/06/2022, 12:05 PM ? ? ?

## 2022-01-07 ENCOUNTER — Encounter: Payer: 59 | Admitting: Dietician

## 2022-01-07 ENCOUNTER — Other Ambulatory Visit (HOSPITAL_COMMUNITY): Payer: Self-pay

## 2022-01-07 ENCOUNTER — Telehealth: Payer: Self-pay | Admitting: Dietician

## 2022-01-07 NOTE — Telephone Encounter (Signed)
Refill Request ? ? ?amLODipine-olmesartan (AZOR) 5-20 MG table ? ? ? [facility] (Ph: [phone])  ?Order Details ?

## 2022-01-07 NOTE — Telephone Encounter (Signed)
Likes her Continuous glucose monitoring, reports her data shows blood sugars of  145-160-190 no lows. She rescheduled her appointment to next week.  ?

## 2022-01-08 ENCOUNTER — Other Ambulatory Visit (HOSPITAL_COMMUNITY): Payer: Self-pay

## 2022-01-08 ENCOUNTER — Ambulatory Visit (INDEPENDENT_AMBULATORY_CARE_PROVIDER_SITE_OTHER): Payer: 59 | Admitting: Neurology

## 2022-01-08 ENCOUNTER — Encounter: Payer: Self-pay | Admitting: Neurology

## 2022-01-08 ENCOUNTER — Other Ambulatory Visit: Payer: Self-pay

## 2022-01-08 VITALS — BP 151/84 | HR 99 | Ht 76.0 in | Wt 282.5 lb

## 2022-01-08 DIAGNOSIS — F4320 Adjustment disorder, unspecified: Secondary | ICD-10-CM | POA: Insufficient documentation

## 2022-01-08 DIAGNOSIS — Z9189 Other specified personal risk factors, not elsewhere classified: Secondary | ICD-10-CM

## 2022-01-08 DIAGNOSIS — F5105 Insomnia due to other mental disorder: Secondary | ICD-10-CM | POA: Insufficient documentation

## 2022-01-08 DIAGNOSIS — G4753 Recurrent isolated sleep paralysis: Secondary | ICD-10-CM

## 2022-01-08 DIAGNOSIS — F4321 Adjustment disorder with depressed mood: Secondary | ICD-10-CM | POA: Insufficient documentation

## 2022-01-08 DIAGNOSIS — G8929 Other chronic pain: Secondary | ICD-10-CM

## 2022-01-08 DIAGNOSIS — F3181 Bipolar II disorder: Secondary | ICD-10-CM

## 2022-01-08 DIAGNOSIS — R0683 Snoring: Secondary | ICD-10-CM | POA: Diagnosis not present

## 2022-01-08 MED ORDER — ATORVASTATIN CALCIUM 40 MG PO TABS
40.0000 mg | ORAL_TABLET | Freq: Every day | ORAL | 2 refills | Status: DC
  Filled 2022-01-08: qty 100, 100d supply, fill #0
  Filled 2022-04-10: qty 100, 100d supply, fill #1
  Filled 2022-07-15: qty 100, 100d supply, fill #2

## 2022-01-08 MED ORDER — AMLODIPINE-OLMESARTAN 5-20 MG PO TABS
1.0000 | ORAL_TABLET | Freq: Every day | ORAL | 1 refills | Status: DC
  Filled 2022-01-08: qty 90, 90d supply, fill #0
  Filled 2022-04-10: qty 90, 90d supply, fill #1

## 2022-01-08 NOTE — Progress Notes (Signed)
SLEEP MEDICINE CLINIC    Provider:  Larey Seat, MD  Primary Care Physician:  [name], MD [address] [place] [address]     Referring Provider: Salley Slaughter, [address] [place],  [place] [id number]    Primary neurologist ; Narda Amber, DO          Chief Complaint according to patient   Patient presents with:     New Patient (Initial Visit)     Pt referred by Eulis Canner for insomnia. No prior SS or CPAP. Pt c/o of not being able to sleep las t couple months. Has had changed to meds by therapist. Pt has also been told she stops breathing at night and snores.       HISTORY OF PRESENT ILLNESS: on 01/08/2022  Tina [name] is a 51 y.o. African American female patient , who is seen on 01-08-2022  upon a referral from NP [facility] for an evaluation of insomnia.   Chief concern according to patient : " My mother died today a year-ago and I was her main caretaker, had home hospice, had bedsores and was always in pain at the end. I can still hear her screaming. " I have been told before that I may have apnea. I am a loud snorer. I can wake up gasping, choking" I take a lot of psycho-pharmacological medication and have failed melatonin, now was given ambien".  " Now I have weird dreams"     Tina [name]  has a past medical history of  BIPOLAR 2 disorder, major depression, Diabetes mellitus, HTN (hypertension), Hyperlipidemia, Iron deficiency anemia, Left acetabular fracture ([place]), Lumbar strain, neuropathy- Dr Posey Pronto. Obesity, and Ovarian cyst.  Sleep relevant medical history: Nocturia 3 times, no Sleep walking, lucid dreamer, dream intrusion, , sinusitis,chronic and allergic rhinitis, seasonal-  wisdom teeth removed.     Family medical /sleep history: no other family member on CPAP with OSA, brother snores loud.    Social history:  Patient is on disability since age 51, no college degree- and lives in a household  with brother Family status is single. Pets are not present. Tobacco use yes/ 0.5 pack per day. ETOH use socially, Caffeine intake in form of  Soda( daily) and Tea ( 1-2 week) . Gym twice a week.    Sleep habits are as follows: The patient's dinner time is between 7 PM. The patient goes to bed at 9 PM and she struggles to sleep- once asleep, she will continue to sleep for intervals of 2 hours, wakes for  bathroom breaks.   The preferred sleep position is right sided, with the support of 2-3 pillows. GERD prevention by sleeping with head of bed elevated.  Bedroom is cool, quiet and dark.   Dreams are reportedly very frequent/vivid. She feels stuck in a dream unable to wake up- sleep paralysis.    5 AM is the usual rise time.  The patient wakes up spontaneously/  she skips breakfast. She reports not feeling refreshed or restored in AM, with symptoms such as dry mouth, morning headaches, and residual fatigue.  Naps are avoided.  Review of Systems: Out of a complete 14 system review, the patient complains of only the following symptoms, and all other reviewed systems are negative.:  Fatigue, sleepiness , snoring,  dream intrusion, realistic -fragmented sleep, Insomnia /    How likely are you to doze in the following situations: 0 = not likely, 1 =  slight chance, 2 = moderate chance, 3 = high chance   Sitting and Reading? Watching Television? Sitting inactive in a public place (theater or meeting)? As a passenger in a car for an hour without a break? Lying down in the afternoon when circumstances permit? Sitting and talking to someone? Sitting quietly after lunch without alcohol? In a car, while stopped for a few minutes in traffic?   Total = 7/ 24 points   FSS was not  endorsed (?)  she stated she highly fatigued.  Social History   Socioeconomic History   Marital status: Single    Spouse name: Not on file   Number of children: 0   Years of education: 12   Highest education level:  Some college, no degree  Occupational History   Occupation: Engineer, materials: UNEMPLOYED    Employer: [employer], [employer]  Tobacco Use   Smoking status: Every Day    Packs/day: 0.50    Years: 23.00    Pack years: 11.50    Types: Cigarettes   Smokeless tobacco: Never   Tobacco comments:    1/2PPD  Vaping Use   Vaping Use: Never used  Substance and Sexual Activity   Alcohol use: Yes    Alcohol/week: 0.0 standard drinks    Comment: occ   Drug use: No   Sexual activity: Yes    Partners: Female    Birth control/protection: Injection    Comment: Depo  Other Topics Concern   Not on file  Social History Narrative   Homosexual; in a monogamous relationship w/ homosexual woman.   She is disabled.  She last worked in 2018 at [employer].    Right handed   One story home   Caffeine: 1 soda daily, occas drinks coffee    Social Determinants of Health   Financial Resource Strain: Not on file  Food Insecurity: Not on file  Transportation Needs: Not on file  Physical Activity: Not on file  Stress: Not on file  Social Connections: Not on file    Family History  Problem Relation Age of Onset   Colon cancer Sister    Diabetes Mother    Diabetes Father    Diabetes Brother    Celiac disease Paternal Aunt     Past Medical History:  Diagnosis Date   Diabetes mellitus    type II   Fibroids    HTN (hypertension)    Hyperlipidemia    Iron deficiency anemia    Left acetabular fracture ([place])    Lumbar strain    Obesity    Ovarian cyst     Past Surgical History:  Procedure Laterality Date   FOOT ARTHRODESIS, MODIFIED MCBRIDE     right foot bunion surgery and ankle surgery 1980s   ORIF ACETABULAR FRACTURE     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 90 tablet 1   ARIPiprazole (ABILIFY) 20 MG tablet Take 1 tablet (20 mg  total) by mouth daily. 30 tablet 3   atorvastatin (LIPITOR) 40 MG tablet Take 1 tablet (40 mg total) by mouth daily. 100 tablet 2   Blood Glucose Monitoring Suppl (CONTOUR NEXT ONE) KIT 1 each by Does not apply route See admin instructions. USE TO CHECK BLOOD GLUCOSE ONCE DAILY. IM PROGRAM. 1 kit 0   busPIRone (BUSPAR) 15 MG tablet Take  1 tablet (15 mg total) by mouth 3 (three) times daily. 90 tablet 3   Continuous Blood Gluc Sensor (FREESTYLE LIBRE 2 SENSOR) MISC Check blood sugar at least 6 times a day 6 each 3   empagliflozin (JARDIANCE) 10 MG TABS tablet Take 1 tablet (10 mg total) by mouth daily before breakfast. 90 tablet 1   glucose blood (CONTOUR NEXT TEST) test strip Please use to check your sugar 3 times a day. 100 strip 3   insulin aspart protamine - aspart (NOVOLOG MIX 70/30 FLEXPEN) (70-30) 100 UNIT/ML FlexPen Inject 25 Units into the skin 2 (two) times daily. 30 mL 1   Insulin Pen Needle 32G X 4 MM MISC Use daily at 6pm 100 each 4   lamoTRIgine (LAMICTAL) 100 MG tablet Take 1 tablet (100 mg total) by mouth daily. 30 tablet 2   Lancets MISC 1 Units by Does not apply route 3 (three) times daily. 100 each 3   meloxicam (MOBIC) 15 MG tablet Take 1 tablet (15 mg total) by mouth daily. 30 tablet 0   metFORMIN (GLUCOPHAGE) 1000 MG tablet Take 1 tablet (1,000 mg total) by mouth 2 (two) times daily with a meal. 180 tablet 1   Multiple Vitamins-Minerals (MULTIVITAMIN WITH MINERALS) tablet Take 1 tablet by mouth daily.     nicotine polacrilex (NICORETTE) 4 MG gum Use 1 piece of gum (4 mg total) by mouth as needed for smoking cessation. 110 tablet 3   Semaglutide, 1 MG/DOSE, (OZEMPIC, 1 MG/DOSE,) 4 MG/3ML SOPN Inject 1 mg into the skin once a week. 3 mL 2   zolpidem (AMBIEN) 5 MG tablet Take 1 tablet (5 mg total) by mouth at bedtime as needed for sleep. 30 tablet 2   gabapentin (NEURONTIN) 300 MG capsule TAKE 1 CAPSULE BY MOUTH 3 TIMES DAILY FOR 3 DAYS, THEN 2 CAPSULES BY MOUTH 3 TIMES DAILY. 549  capsule 0   No current facility-administered medications on file prior to visit.    Allergies  Allergen Reactions   Codeine Other (See Comments)    hallucinations    Physical exam:  Today's Vitals   01/08/22 0842  BP: (!) 151/84  Pulse: 99  Weight: 282 lb 8 oz (128.1 kg)  Height: $Remove'6\' 4"'ndZZrEa$  (1.93 m)   Body mass index is 34.39 kg/m.   Wt Readings from Last 3 Encounters:  01/08/22 282 lb 8 oz (128.1 kg)  01/06/22 283 lb 3.2 oz (128.5 kg)  12/30/21 285 lb 3.2 oz (129.4 kg)     Ht Readings from Last 3 Encounters:  01/08/22 $RemoveB'6\' 4"'WaVHJWmY$  (1.93 m)  07/31/21 $RemoveB'6\' 4"'fcbWirIr$  (1.93 m)  07/25/21 $RemoveB'6\' 4"'LVnMNKll$  (1.93 m)      General: The patient is awake, alert and appears not in acute distress. The patient is well groomed. Head: Normocephalic, atraumatic. Neck is supple. Mallampati 3,  elongated midface.  neck circumference:16 inches . Nasal airflow  patent.  Retrognathia is not seen.  Dental status: biological  Cardiovascular:  Regular rate and cardiac rhythm by pulse,  without distended neck veins. Respiratory: Lungs are clear to auscultation.  Skin:  Without evidence of ankle edema, or rash. Trunk: The patient's posture is erect.   Neurologic exam : The patient is awake and alert, oriented to place and time.   Memory subjective described as intact.  Attention span & concentration ability appears normal.  Speech is fluent,  without  dysarthria, dysphonia or aphasia.  Mood and affect are appropriate.   Cranial nerves: no loss of smell or  taste reported  Pupils are equal and briskly reactive to light. Funduscopic exam deferred.  Extraocular movements in vertical and horizontal planes were intact and without nystagmus. No Diplopia. Visual fields by finger perimetry are intact. Hearing was intact to soft voice and finger rubbing.    Facial sensation intact to fine touch.  Facial motor strength is symmetric and tongue and uvula move midline.  Neck ROM : rotation, tilt and flexion extension were normal  for age and shoulder shrug was symmetrical.    Motor exam:  Symmetric bulk, tone and ROM.   Normal tone without cog wheeling, symmetric grip strength .   Sensory:  Fine touch, pinprick and vibration were tested  and  normal.  Proprioception tested in the upper extremities was normal.   Coordination: Rapid alternating movements in the fingers/hands were of normal speed.  The Finger-to-nose maneuver was intact without evidence of ataxia, dysmetria or tremor.   Gait and station: Patient could rise unassisted from a seated position, walked without assistive device.  Deep tendon reflexes: in the  upper and lower extremities are attenuated- traces only.  Babinski response was deferred.        After spending a total time of  40  minutes ; this includes interview, anamneses, face to face and additional time for physical and neurologic examination, review of laboratory studies,  personal review of imaging studies, reports and results of other testing and review of referral information / records as far as provided in visit, I have established the following assessments:  1)  witnessed crescendo snoring and apneas. 2) risk factors for OSA are airway anatomy and BMI. Neck size, rhinitis.  Also ongoing tobacco smoking is considered a risk actor, especially for sleep related hypoxia and hypoventilation  3) insomnia has been accelerated since mother death, grief reaction with vivid dreams, sleep paralysis, dream intrusion, hypnagogic hallucinations.    My Plan is to proceed with:  1) HLA narcolepsy test was considered, but the key element of HYPERSOMNIA is lacking. Not done today.  2) PSG attended , SPLIT at [facility] 10/h. Patient cannot stay for MSLT, sicne her medication preclude a valid test result.  3) PLan B - HST   I would like to thank [name], MD and Salley Slaughter, [address] [place],   [id number] for allowing me to meet with and to take care of this pleasant patient. I I  plan to follow up either personally or through our NP within 2-4 months.   CC: I will share my notes with PCP and primary neurologist.   Electronically signed by: Larey Seat, MD 01/08/2022 9:35 AM  [facility] Sleep Board certified by The [employer] of Sleep Medicine and Diplomate of the [employer] of Sleep Medicine. Board certified In Neurology through the [place], Fellow of the [employer] of Neurology. Medical Director of [employer].

## 2022-01-09 ENCOUNTER — Other Ambulatory Visit (HOSPITAL_COMMUNITY): Payer: Self-pay

## 2022-01-10 ENCOUNTER — Other Ambulatory Visit (HOSPITAL_COMMUNITY): Payer: Self-pay

## 2022-01-14 ENCOUNTER — Other Ambulatory Visit: Payer: Self-pay

## 2022-01-14 ENCOUNTER — Ambulatory Visit (INDEPENDENT_AMBULATORY_CARE_PROVIDER_SITE_OTHER): Payer: 59 | Admitting: Dietician

## 2022-01-14 ENCOUNTER — Encounter: Payer: Self-pay | Admitting: Dietician

## 2022-01-14 DIAGNOSIS — Z794 Long term (current) use of insulin: Secondary | ICD-10-CM

## 2022-01-14 DIAGNOSIS — E1169 Type 2 diabetes mellitus with other specified complication: Secondary | ICD-10-CM | POA: Diagnosis not present

## 2022-01-14 LAB — POCT GLYCOSYLATED HEMOGLOBIN (HGB A1C): Hemoglobin A1C: 7.4 % — AB (ref 4.0–5.6)

## 2022-01-14 LAB — GLUCOSE, CAPILLARY: Glucose-Capillary: 126 mg/dL — ABNORMAL HIGH (ref 70–99)

## 2022-01-14 NOTE — Progress Notes (Signed)
? ?Visit Type:  Follow-up (2nd this calendar year) ? ?Appt. Start Time: 812 Appt. End Time: 858 ? ?01/14/2022 ? ?[name], identified by name and date of birth, is a 51 y.o. female with a diagnosis of Diabetes:  Type 2  ? ?ASSESSMENT ? ?[name] is here for a follow up Diabetes Self Management Education & Support visit. She brought her phone and an freestyle libre 2 sensor today. She knows how to put it on, but states when she did so at home, she bled from the site and removed the sensor because this scared her. She showed me the bruise and it was on the front side of her arm. She may have hit muscle causing the bleeding. She was advised in the future to place the sensor on the back part of her arm.  ? ?Assisted her with being sure she is connected remotely and placing new sensor today.  ? ?She did not want to weigh today. She interested in weight loss and is thinking she may need higher dose of semaglutide. She reports hunger at times despite having eaten. We discussed this could be du to mismatch of food and insulin both endogenous and exogenous. ? ?Estimated body mass index is 34.39 kg/m? as calculated from the following: ?  Height as of 01/08/22: '6\' 4"'$  (1.93 m). ?  Weight as of 01/08/22: 282 lb 8 oz (128.1 kg). ?Wt Readings from Last 10 Encounters:  ?01/08/22 282 lb 8 oz (128.1 kg)  ?01/06/22 283 lb 3.2 oz (128.5 kg)  ?12/30/21 285 lb 3.2 oz (129.4 kg)  ?12/18/21 279 lb 9.6 oz (126.8 kg)  ?10/16/21 274 lb 6.4 oz (124.5 kg)  ?07/31/21 247 lb (112 kg)  ?07/25/21 247 lb (112 kg)  ?07/10/21 257 lb 3.2 oz (116.7 kg)  ?07/10/21 257 lb 3.2 oz (116.7 kg)  ?07/03/21 253 lb 4.8 oz (114.9 kg)  ? ?Lab Results  ?Component Value Date  ? HGBA1C 7.0 (A) 10/16/2021  ? HGBA1C >14.0 (A) 06/26/2021  ? HGBA1C 7.1 (A) 12/05/2020  ? HGBA1C >14.0 (A) 07/30/2020  ? HGBA1C 12.9 (A) 10/10/2019  ?  ?BP Readings from Last 3 Encounters:  ?01/08/22 (!) 151/84  ?12/18/21 122/82  ?10/16/21 137/79  ? ? ?  ? Diabetes  Self-Management Education - 01/14/22 0800   ? ?  ? Psychosocial Assessment  ? Patient Belief/Attitude about Diabetes Motivated to manage diabetes   ?  ? Complications  ? Last HgB A1C per patient/outside source 7 %   ? How often do you check your blood sugar? > 4 times/day   she forgot to bring her reader today so unable to assess  ? Fasting Blood glucose range (mg/dL) 130-179   ? Postprandial Blood glucose range (mg/dL) 180-200;>200   ? Number of hypoglycemic episodes per month 0   ? Number of hyperglycemic episodes per week 0   ? Have you had a dilated eye exam in the past 12 months? Yes   ? Have you had a dental exam in the past 12 months? Yes   ? Are you checking your feet? Yes   ? How many days per week are you checking your feet? 7   ?  ? Dietary Intake  ? Breakfast sometimes skips and sometimes drinks juice   ?  ? Exercise  ? Exercise Type ADL's;Light (walking / raking leaves)   ? How many days per week to you exercise? 2   ? How many minutes per day do you exercise? 60   ?  Total minutes per week of exercise 120   ?  ? Patient Education  ? Previous Diabetes Education Yes (please comment)   here last year  ? Nutrition management  Reviewed blood glucose goals for pre and post meals and how to evaluate the patients' food intake on their blood glucose level.;Meal timing in regards to the patients' current diabetes medication.   ? Physical activity and exercise  Identified with patient nutritional and/or medication changes necessary with exercise.   ? Medications Reviewed patients medication for diabetes, action, purpose, timing of dose and side effects.   ? Monitoring Other (comment)   educated on app and trsdn arrows  ? Acute complications Discussed and identified patients' treatment of hyperglycemia.;Taught treatment of hypoglycemia - the 15 rule.   ?  ? Patient Self-Evaluation of Goals - Patient rates self as meeting previously set goals (% of time)  ? Monitoring >75%   ?  ? Post-Education Assessment  ? Patient  understands incorporating nutritional management into lifestyle. Demonstrates understanding / competency   ? Patient undertands incorporating physical activity into lifestyle. Demonstrates understanding / competency   ? Patient understands using medications safely. Demonstrates understanding / competency   ? Patient understands monitoring blood glucose, interpreting and using results Demonstrates understanding / competency   ? Patient understands prevention, detection, and treatment of acute complications. Demonstrates understanding / competency   ?  ? Outcomes  ? Program Status Completed   ?  ? Subsequent Visit  ? Since your last visit have you continued or begun to take your medications as prescribed? Yes   ? Since your last visit have you had your blood pressure checked? No   ? Since your last visit have you experienced any weight changes? No change   ? Since your last visit, are you checking your blood glucose at least once a day? Yes   ? ?  ?  ? ?  ? ? ?Learning Objective:  Patient will have a greater understanding of diabetes self-management. ?Patient education plan is to attend individual and/or group sessions per assessed needs and concerns. ? ? ?Plan:  ? ?Patient Instructions  ?Look for low blood sugar when you are hungry by checking your blood sugars. ? ?Trend arrows- use them to adjust your food and activity and to detect need for changes in medication.  ? ?Please make a follow up with your doctor.  ? ?Scan your sensor at least 6 times a day- when waking up, before and after meals and bedtime. ? ?We can follow up in 6- 12 months for your diabetes. ? Tina [name] ?[phone] ? ? ?Expected Outcomes:  Demonstrated interest in learning. Expect positive outcomes ? ?Education material provided: Diabetes Resources ? ?If problems or questions, patient to contact team via:  Phone ? ?Future DSME appointment: - 6 months- 12 months ?Tina [name], RD ?01/14/2022 ?9:10 AM. ? ?

## 2022-01-14 NOTE — Patient Instructions (Addendum)
Look for low blood sugar when you are hungry by checking your blood sugars. ? ?Trend arrows- use them to adjust your food and activity and to detect need for changes in medication.  ? ?Please make a follow up with your doctor.  ? ?Scan your sensor at least 6 times a day- when waking up, before and after meals and bedtime. ? ?We can follow up in 6- 12 months for your diabetes. ? Butch Penny ?[phone] ?

## 2022-01-16 LAB — HM DIABETES EYE EXAM

## 2022-01-20 NOTE — Progress Notes (Signed)
[employer] PREP Weekly Session ? ?Patient Details  ?Name: Rick Warnick Heyden ?MRN: [id number] ?Date of Birth: 1971-10-29 ?Age: 51 y.o. ?PCP: [name], MD ? ?Vitals:  ? 01/20/22 1458  ?Weight: 284 lb (128.8 kg)  ? ? ? [facility] Weekly seesion - 01/20/22 1400   ? ?  ? [facility]PREP" Location  ? [facility]PREP[facility] [place]   ?  ? Weekly Session  ? Topic Discussed Health habits   ? Minutes exercised this week 20 minutes   ? Classes attended to date 5   ? ?  ?  ? ?  ? ? ?Barnett Hatter ?01/20/2022, 2:58 PM ? ? ?

## 2022-01-21 ENCOUNTER — Encounter: Payer: Self-pay | Admitting: Internal Medicine

## 2022-01-21 ENCOUNTER — Ambulatory Visit (INDEPENDENT_AMBULATORY_CARE_PROVIDER_SITE_OTHER): Payer: 59 | Admitting: Internal Medicine

## 2022-01-21 ENCOUNTER — Telehealth: Payer: Self-pay

## 2022-01-21 ENCOUNTER — Other Ambulatory Visit: Payer: Self-pay

## 2022-01-21 ENCOUNTER — Other Ambulatory Visit (HOSPITAL_COMMUNITY): Payer: Self-pay

## 2022-01-21 VITALS — BP 130/65 | HR 96 | Temp 98.5°F | Resp 24 | Ht 76.0 in | Wt 285.1 lb

## 2022-01-21 DIAGNOSIS — M79602 Pain in left arm: Secondary | ICD-10-CM | POA: Diagnosis not present

## 2022-01-21 DIAGNOSIS — S46312A Strain of muscle, fascia and tendon of triceps, left arm, initial encounter: Secondary | ICD-10-CM | POA: Insufficient documentation

## 2022-01-21 DIAGNOSIS — M7062 Trochanteric bursitis, left hip: Secondary | ICD-10-CM | POA: Insufficient documentation

## 2022-01-21 MED ORDER — TRIAMCINOLONE ACETONIDE 0.5 % EX OINT
1.0000 "application " | TOPICAL_OINTMENT | Freq: Two times a day (BID) | CUTANEOUS | 0 refills | Status: DC
  Filled 2022-01-21: qty 30, 15d supply, fill #0

## 2022-01-21 MED ORDER — TRIAMCINOLONE ACETONIDE 0.1 % EX CREA
1.0000 "application " | TOPICAL_CREAM | Freq: Two times a day (BID) | CUTANEOUS | 0 refills | Status: DC
  Filled 2022-01-21: qty 30, 15d supply, fill #0

## 2022-01-21 MED ORDER — KETOROLAC TROMETHAMINE 30 MG/ML IJ SOLN
30.0000 mg | Freq: Once | INTRAMUSCULAR | Status: AC
  Administered 2022-01-21: 30 mg via INTRAMUSCULAR

## 2022-01-21 NOTE — Patient Instructions (Signed)
Thank you, [name] for allowing [place] to provide your care today. Today we discussed your arm and leg pain. ? ?1) I have sent a prescription to your pharmacy for Kenalog cream.  You can apply this twice a day to the arm for pain.  We anticipate that this pain will slowly get better over time. ? ?2) you have received a Toradol shot in the clinic today.  We are hopeful that this will improve the pain in your left hip/leg area. ? ?I have ordered the following labs for you: ? ?Lab Orders  ?No laboratory test(s) ordered today  ?  ? ?Referrals ordered today:  ? ?Referral Orders  ?No referral(s) requested today  ?  ? ?I have ordered the following medication/changed the following medications:  ? ? ?Start the following medications: ?Meds ordered this encounter  ?Medications  ? DISCONTD: triamcinolone ointment (KENALOG) 0.5 %  ?  Sig: Apply 1 application. topically 2 (two) times daily.  ?  Dispense:  30 g  ?  Refill:  0  ? triamcinolone cream (KENALOG) 0.1 %  ?  Sig: Apply 1 application. topically 2 (two) times daily.  ?  Dispense:  30 g  ?  Refill:  0  ? ketorolac (TORADOL) 30 MG/ML injection 30 mg  ?  ? ?Follow up:  as needed for the pain.   ? ? ?Should you have any questions or concerns please call the internal medicine clinic at [phone].   ? ? ? ?

## 2022-01-21 NOTE — Telephone Encounter (Signed)
LVM for pt to call me back to schedule sleep study  

## 2022-01-21 NOTE — Assessment & Plan Note (Signed)
The patient endorses a 1 week history of left arm pain.  She says this started when she tried applying freestyle libre sensor to the left arm.  She noticed profuse bleeding which worried her, and she was seen by Butch Penny last week.  Her arm pain was thought to be due to muscle soreness from incorrect application of the sensor.  Butch Penny reapplied the sensor to the right arm.  No issues with the right arm today. ? ?The patient endorses a sore/crampy feeling in her left upper arm.  She sometimes has a tingling sensation in her left fingers when she is having arm pain, but otherwise denies numbness or tingling.  She has tried Tylenol/Aleve, meloxicam (last took yesterday), lidocaine patches, Voltaren gel, none of which have alleviated the pain. ? ?There is slight bruising at the left upper arm area with some slight nodularity in the same area. ? ?Plan: ?At this time, we will try triamcinolone cream, 0.1% twice daily for about a week for the pain.  Follow-up as needed for the pain. ?

## 2022-01-21 NOTE — Assessment & Plan Note (Signed)
The patient presents today with a 2 week complaint of left hip/leg pain. She endorses a history of sciatica on the left side but states that the pain she is having today feels different than this.  She says she is unable to lie on the left side due to the pain.  She describes as a "nagging feeling in her leg."  She says that walking or standing exacerbates the pain.  She has had a Toradol shot on the left side before, which helped alleviate her pain.  She denies any numbness or tingling.  Treatments she has tried before include Tylenol/Aleve, lidocaine patches, diclofenac gel, heat/ice, none of which have helped the pain. ? ?On exam, there is tenderness palpation the left anterior hip area.  Range of motion is almost full but somewhat limited due to pain. ? ?Plan: ?History and exam findings seem consistent with trochanteric bursitis of the left hip.  We will give Toradol injection in the left hip area in the clinic.  Follow-up as needed for the left hip. ? ?

## 2022-01-21 NOTE — Progress Notes (Signed)
? ?  CC: left leg and arm pain ? ?HPI: ? ?Tina [name] is a 51 y.o. with past medical history as noted below who presents to the clinic today for left leg and arm pain. Please see problem-based list for further details, assessments, and plans.  ? ?Past Medical History:  ?Diagnosis Date  ? Diabetes mellitus   ? type II  ? Fibroids   ? HTN (hypertension)   ? Hyperlipidemia   ? Iron deficiency anemia   ? Left acetabular fracture ([place])   ? Lumbar strain   ? Obesity   ? Ovarian cyst   ? ?Review of Systems: Negative aside from that listed in individualized problem based charting.  ? ?Physical Exam: ? ?Vitals:  ? 01/21/22 0916  ?BP: 130/65  ?Pulse: 96  ?Resp: (!) 24  ?Temp: 98.5 ?F (36.9 ?C)  ?TempSrc: Oral  ?SpO2: 100%  ?Weight: 285 lb 1.6 oz (129.3 kg)  ?Height: '6\' 4"'$  (1.93 m)  ? ?General: NAD, nl appearance ?HE: Normocephalic, atraumatic, EOMI, Conjunctivae normal ?ENT: No congestion, no rhinorrhea, no exudate or erythema  ?Cardiovascular: Normal rate, regular rhythm. No murmurs, rubs, or gallops ?Pulmonary: Effort normal, breath sounds normal. No wheezes, rales, or rhonchi ?Abdominal: soft, nontender, bowel sounds present ?Musculoskeletal: Left arm: Range of motion limited due to pain.  Tenderness to palpation on multiple areas of the left upper arm.  Sensation is intact.  There is slight bruising of the left upper arm area with some slight nodularity in the same area. ?Left leg/hip area: Sensation is intact.  Range of motion is almost full but is somewhat limited due to pain.  Tenderness palpation of the anterior aspect of the left hip area. ?Skin: Warm, dry, no bruising, erythema, or rash ?Psychiatric/Behavioral: normal mood, normal behavior    ? ? ?Assessment & Plan:  ? ?See Encounters Tab for problem based charting. ? ?Patient discussed with Dr. Philipp Ovens ? ?

## 2022-01-22 ENCOUNTER — Other Ambulatory Visit (HOSPITAL_COMMUNITY): Payer: Self-pay

## 2022-01-26 NOTE — Progress Notes (Signed)
Internal Medicine Clinic Attending ? ?I saw and evaluated the patient.  I personally confirmed the key portions of the history and exam documented by Dr. Lorin Glass and I reviewed pertinent patient test results.  The assessment, diagnosis, and plan were formulated together and I agree with the documentation in the resident?s note. ? ?History and exam seem consistent with trochanteric bursitis. She received an IM injection of toradol today as this has helped in the past. Consider steroid trochanteric bursal injection if no improvement at follow up.  ?

## 2022-02-04 ENCOUNTER — Telehealth: Payer: Self-pay

## 2022-02-04 NOTE — Telephone Encounter (Signed)
LVM for pt to call me back to schedule sleep study  

## 2022-02-04 NOTE — Progress Notes (Signed)
[facility] PRE[facility] Weekly Session ? ?Patient Details  ?Name: Tina [name] ?MRN: [id number] ?Date of Birth: 06-12-1971 ?Age: 51 y.o. ?PCP: [name], MD ? ?There were no vitals filed for this visit. ? ? [facility] Weekly seesion - 02/04/22 1600   ? ?  ? [facility]PREP[facility] Location  ? [facility] [place]   ?  ? Weekly Session  ? Topic Discussed Stress management and problem solving   meditation, breathwork  ? Minutes exercised this week 30 minutes   ? Classes attended to date 7   ? ?  ?  ? ?  ? ? ?Barnett Hatter ?02/04/2022, 4:54 PM ? ? ?

## 2022-02-04 NOTE — Progress Notes (Signed)
[facility] PRE[facility] Weekly Session ? ?Patient Details  ?Name: Chrysten Woulfe Wilbourne ?MRN: [id number] ?Date of Birth: 1971/09/17 ?Age: 51 y.o. ?PCP: [name], MD ? ?Vitals:  ? 02/03/22 1030  ?Weight: 284 lb (128.8 kg)  ? ? ? ? ?Barnett Hatter ?02/04/2022, 5:00 PM ? ? ?

## 2022-02-07 ENCOUNTER — Other Ambulatory Visit (HOSPITAL_COMMUNITY): Payer: Self-pay

## 2022-02-12 ENCOUNTER — Telehealth: Payer: Self-pay

## 2022-02-12 NOTE — Telephone Encounter (Signed)
We have attempted to call the patient 2 times to schedule sleep study. Patient has been unavailable at the phone numbers we have on file and has not returned our calls. If patient calls back we will schedule them for their sleep study. ° °

## 2022-02-13 ENCOUNTER — Encounter: Payer: Self-pay | Admitting: Internal Medicine

## 2022-02-13 ENCOUNTER — Ambulatory Visit (HOSPITAL_COMMUNITY)
Admission: RE | Admit: 2022-02-13 | Discharge: 2022-02-13 | Disposition: A | Discharge status: Home / Self Care | Payer: 59 | Source: Ambulatory Visit | Attending: Internal Medicine | Admitting: Internal Medicine

## 2022-02-13 ENCOUNTER — Ambulatory Visit (INDEPENDENT_AMBULATORY_CARE_PROVIDER_SITE_OTHER): Payer: 59 | Admitting: Internal Medicine

## 2022-02-13 ENCOUNTER — Other Ambulatory Visit (HOSPITAL_COMMUNITY): Payer: Self-pay

## 2022-02-13 VITALS — BP 150/81 | HR 84 | Temp 98.1°F | Ht 76.0 in | Wt 282.7 lb

## 2022-02-13 DIAGNOSIS — M79602 Pain in left arm: Secondary | ICD-10-CM | POA: Diagnosis present

## 2022-02-13 MED ORDER — NAPROXEN 500 MG PO TABS
500.0000 mg | ORAL_TABLET | Freq: Two times a day (BID) | ORAL | 0 refills | Status: DC
  Filled 2022-02-13: qty 60, 30d supply, fill #0

## 2022-02-13 MED ORDER — TRAMADOL HCL 50 MG PO TABS
50.0000 mg | ORAL_TABLET | Freq: Two times a day (BID) | ORAL | 0 refills | Status: DC | PRN
  Filled 2022-02-13: qty 14, 7d supply, fill #0
  Filled 2022-04-10: qty 14, 7d supply, fill #1

## 2022-02-13 NOTE — Assessment & Plan Note (Addendum)
Patient fell in the shower on April 26 and landed on the proximal aspect of her LUE. Since that time she has had near-constant pain over the posterior and lateral aspects of the proximal LUE with no relief from muscle rub, tiger balm, ibuprofen, aleve, or muscle relaxers. Movement is limited secondary to pain and she has required assistance from others in dressing herself, shaving needs, hair styling needs. She is unable to sleep on the L side of her body and hs to keep the arm straight as when she bends at the elbow she has pain. She did initially have swelling but no lacerations. The pain starts at the acromion process and stops at the olecranon process. She denies numbness or tingling in the hand or wrist, and distal movement is not affected by pain.  ?Assessment: On exam there is no edema or bruising to the LUE. Active and passive range of motion are limited by pain, though she is able to abduct the extremity. She does not attempt internal rotation at the shoulder due to pain. Yergason's test was normal and strength to distal LUE is intact and normal. Physical exam findings combined with subjective information from the patient lead me to feel her presentation is more likely due to humerus fracture rather than rotator cuff tear, though this is still on the differential. ?Plan: Will obtain x-ray of L humerus and shoulder. I have sent in naproxen 500 mg BID in addition to tramadol 50 mg BID for breakthrough/severe pain. ? ?ADDENDUM:No fractures noted on x-ray. Patient endorses some improvement of pain with pain medications since our visit. I have advised her to contact the clinic if she continues to have significant pain around mid-week next week as that would raise my concern for further imaging needs such as MRI to assess for connective tissue damage or more discrete fractures. She is agreeble to this plan. ?

## 2022-02-13 NOTE — Patient Instructions (Addendum)
Ms. Vanpelt, ? ?It was a pleasure to meet you today. I am sorry that your arm has been so bothersome! I have ordered x-rays of your left shoulder, upper arm, and elbow to check for a break in this area. I will call you with these results. ? ?I have also sent prescription for further pain control in the meantime: ?-Naproxen 500 mg twice daily as needed for pain ?-Tramadol 50 mg up to four times daily as needed for sever pain ? ?Please make sure that you are not taking meloxicam while taking naloxone as they are both from the same drug class. ? ?Remember: If you have any questions or concerns, please call our clinic at [phone] between 9am-5pm and after hours call [phone] and ask for the internal medicine resident on call. If you feel you are having a medical emergency please call 911. ? ?Farrel Gordon, DO  ?

## 2022-02-13 NOTE — Progress Notes (Addendum)
? ?CC: L arm pain ? ?HPI: ? ?[name] is a 51 y.o. female with past medical history as detailed below who presents with L arm pain since a fall April 26. Please see problem-based charting for detailed assessment and plan. ? ?Past Medical History:  ?Diagnosis Date  ? Diabetes mellitus   ? type II  ? Fibroids   ? HTN (hypertension)   ? Hyperlipidemia   ? Iron deficiency anemia   ? Left acetabular fracture ([place])   ? Lumbar strain   ? Obesity   ? Ovarian cyst   ? ?Review of Systems:   ?Review of Systems  ?Musculoskeletal:  Positive for falls, joint pain and myalgias. Negative for back pain and neck pain.  ?Neurological:  Positive for focal weakness. Negative for dizziness, tingling, tremors, sensory change and loss of consciousness.  ? ?Physical Exam: ? ?Vitals:  ? 02/13/22 1315  ?BP: (!) 150/81  ?Pulse: 84  ?Temp: 98.1 ?F (36.7 ?C)  ?TempSrc: Oral  ?SpO2: 100%  ?Weight: 282 lb 11.2 oz (128.2 kg)  ?Height: '6\' 4"'$  (1.93 m)  ? ?Constitutional:Patient is sitting comfortably in wheelchair, in no acute distress. ?Cardio:Regular rate and rhythm. ?Pulm:Normal work of breathing on room air. ?MSK:LUE without edema, redness, hematoma. No palpable deformities to entirety of LUE. Passive and active range of motion at shoulder joint are limited by pain, with inability to internally rotate due to pain. Yergason's test negative. No crepitus or range of motion limit at elbow. No lateral or posterior thoracic cage pain on L and no shoulder pain. ?Skin:Warm and dry without abrasions or hematoma. ?Neuro:Alert and oriented x3. No focal deficit noted. ?Psych:Normal mood and affect. ? ?Assessment & Plan:  ? ?Arm pain, left ?Patient fell in the shower on April 26 and landed on the proximal aspect of her LUE. Since that time she has had near-constant pain over the posterior and lateral aspects of the proximal LUE with no relief from muscle rub, tiger balm, ibuprofen, aleve, or muscle relaxers. Movement is limited  secondary to pain and she has required assistance from others in dressing herself, shaving needs, hair styling needs. She is unable to sleep on the L side of her body and hs to keep the arm straight as when she bends at the elbow she has pain. She did initially have swelling but no lacerations. The pain starts at the acromion process and stops at the olecranon process. She denies numbness or tingling in the hand or wrist, and distal movement is not affected by pain.  ?Assessment: On exam there is no edema or bruising to the LUE. Active and passive range of motion are limited by pain, though she is able to abduct the extremity. She does not attempt internal rotation at the shoulder due to pain. Yergason's test was normal and strength to distal LUE is intact and normal. Physical exam findings combined with subjective information from the patient lead me to feel her presentation is more likely due to humerus fracture rather than rotator cuff tear, though this is still on the differential. ?Plan: Will obtain x-ray of L humerus and shoulder. I have sent in naproxen 500 mg BID in addition to tramadol 50 mg BID for breakthrough/severe pain. ? ?ADDENDUM:No fractures noted on x-ray. Patient endorses some improvement of pain with pain medications since our visit. I have advised her to contact the clinic if she continues to have significant pain around mid-week next week as that would raise my concern for further imaging needs such  as MRI to assess for connective tissue damage or more discrete fractures. She is agreeble to this plan.  ? ?See Encounters Tab for problem based charting. ? ?Patient discussed with Dr. Philipp Ovens ? ?

## 2022-02-14 ENCOUNTER — Encounter (HOSPITAL_COMMUNITY): Payer: Self-pay

## 2022-02-14 ENCOUNTER — Telehealth: Payer: Self-pay | Admitting: Internal Medicine

## 2022-02-14 ENCOUNTER — Ambulatory Visit (HOSPITAL_COMMUNITY): Payer: 59 | Admitting: Clinical

## 2022-02-14 NOTE — Progress Notes (Signed)
Internal Medicine Clinic Attending ? ?Case discussed with Dr. Dean  At the time of the visit.  We reviewed the resident?s history and exam and pertinent patient test results.  I agree with the assessment, diagnosis, and plan of care documented in the resident?s note.  ?

## 2022-02-14 NOTE — Telephone Encounter (Signed)
Spoke with patient to let her know that the x-rays of her shoulder and arm did not show any fractures. She endorses continued pain today but that the pain medication I sent in has helped alleviate it some. I have advised her to call back if mid-week next week she is not seeing any improvement of her pain as we may need an MRI to evaluate her rotator cuff, though I still do not feel strongly to be the etiology of her pain. She voiced understanding. ? ?Farrel Gordon, DO ?

## 2022-02-17 NOTE — Progress Notes (Signed)
[facility] PRE[facility] Weekly Session ? ?Patient Details  ?Name: Tina [name] ?MRN: [id number] ?Date of Birth: [birth year] ?Age: 51 y.o. ?PCP: [name], MD ? ?Vitals:  ? 02/17/22 1348  ?Weight: 285 lb (129.3 kg)  ? ? ? [facility] sees[facility] - 02/17/22 1300   ? ?  ? [facility]  ? [facility]PREP[facility] [place]   ?  ? Weekly Session  ? Topic Discussed Other   portion size, label reading  ? Minutes exercised this week 40 minutes   ? Classes attended to date 67   ? ?  ?  ? ?  ? ? ?Barnett Hatter ?02/17/2022, 1:49 PM ? ? ?

## 2022-02-19 ENCOUNTER — Other Ambulatory Visit (HOSPITAL_COMMUNITY): Payer: Self-pay

## 2022-02-25 ENCOUNTER — Ambulatory Visit (INDEPENDENT_AMBULATORY_CARE_PROVIDER_SITE_OTHER): Payer: 59 | Admitting: Clinical

## 2022-02-25 ENCOUNTER — Encounter (HOSPITAL_COMMUNITY): Payer: Self-pay

## 2022-02-25 DIAGNOSIS — F3181 Bipolar II disorder: Secondary | ICD-10-CM | POA: Diagnosis not present

## 2022-02-25 NOTE — Plan of Care (Signed)
?  Problem: Depression CCP Problem  1 coping skills ?Goal: LTG: Reduce frequency, intensity, and duration of depression symptoms as evidenced by: the clients self 1x per session ?Outcome: Progressing ?Goal: LTG: Tina [name] WILL SCORE LESS THAN 10 ON THE PATIENT HEALTH QUESTIONNAIRE (PHQ-9) ?Outcome: Progressing ?Goal: STG: Tina [name] WILL IDENTIFY 3 COGNITIVE PATTERNS AND BELIEFS THAT SUPPORT DEPRESSION ?Outcome: Progressing ?  ?

## 2022-02-25 NOTE — Progress Notes (Signed)
? ?THERAPIST PROGRESS NOTE ?Virtual Visit via Telephone Note ? ?I connected with Tina [name] on 02/25/22 at  9:00 AM EDT by telephone and verified that I am speaking with the correct person using two identifiers. ? ?Location: ?Patient: home ?Provider: office ?  ?I discussed the limitations, risks, security and privacy concerns of performing an evaluation and management service by telephone and the availability of in person appointments. I also discussed with the patient that there may be a patient responsible charge related to this service. The patient expressed understanding and agreed to proceed. ? ? ?Follow Up Instructions: ?I discussed the assessment and treatment plan with the patient. The patient was provided an opportunity to ask questions and all were answered. The patient agreed with the plan and demonstrated an understanding of the instructions. ?  ?The patient was advised to call back or seek an in-person evaluation if the symptoms worsen or if the condition fails to improve as anticipated. ? ? ? ?Session Time: 30 minutes ? ?Participation Level: Active ? ?Behavioral Response: NAAlertDepressed ? ?Type of Therapy: Individual Therapy ? ?Treatment Goals addressed: reduce frequency, intensity, and duration of depressive symptoms as evidenced by: the clients self report 1x per session ? ?ProgressTowards Goals: Progressing ? ?Interventions: CBT and Supportive ? ?Summary:  ?Tina [name] is a 51 y.o. female who presents for the scheduled appointment oriented times five, appropriately dressed, and friendly. Client reported auditory hallucinations and denied delusions. ?Client reported on today she is doing fairly well. Client reported she was unable to keep the last appointment because she fell and hurt her left arm. Client reported they determined it was not fractured but she still has ongoing pain. Client reported she still has periodic times when she hears voices. Client  reported it occurs while doing her daily activity. Client reported she spoke with the doctor and will discuss it again at her next med management appointment. Client reported it is not a major hindrance to her daily functioning and they are not command. Client reported otherwise she has ongoing periods of depression dealing with family and reflecting on memories of her mother. Client reported her niece calls her frequently to talk about things she going through and sometimes its more than she can handle to listen to. Client reported she does not like to talk to family about her thoughts and problems because she does not want them to misconstrue what shes saying and take it personal as some have in the past. Client reported she is the kind of person to keep things to herself. Client reported she visits her mothers grave often to sit and place fresh flowers. Client reported she has days of difficulty coping but feels like she is doing better with grieving. Client reported she knows her mother wouldn't want her to cry. ?Evidence of progress towards goal:  client reported she is able to use 2 coping skills of cognitive reframing depressive thoughts to help cope with grief without being prompted to as well as using activities such as listening to music to help alleviate negative emotions. ? ? ?Suicidal/Homicidal: Nowithout intent/plan ? ?Therapist Response:  ?Therapist began the appointment asking the client how she has been doing since last seen. ?Therapist used CBT to engage using active listening and positive emotional support. ?Therapist used CBT to engage and ask the client about medication compliance compared to ongoing symptoms. ?Therapist used CBT to ask the client to clarify the source of her depressive symptoms. ?Therapist used CBT to engage with the client  about processing her symptoms of grief. ?Therapist used CBT ask the client to identify her progress with frequency of use with coping skills with continued  practice in her daily activity.    ?Therapist assigned the client homework to continue her self care activity. ?Client was scheduled for next appointment. ? ? ? ?Plan: Return again in 5 weeks. ? ?Diagnosis: bipolar 2 disorder, major depressive episode ? ?Collaboration of Care: Patient refused AEB none requested by the client. ? ?Patient/Guardian was advised Release of Information must be obtained prior to any record release in order to collaborate their care with an outside provider. Patient/Guardian was advised if they have not already done so to contact the registration department to sign all necessary forms in order for Tina [name] to release information regarding their care.  ? ?Consent: Patient/Guardian gives verbal consent for treatment and assignment of benefits for services provided during this visit. Patient/Guardian expressed understanding and agreed to proceed.  ? ?Birdena Jubilee Mats Jeanlouis, LCSW ?02/25/2022 ? ?

## 2022-02-28 ENCOUNTER — Telehealth: Payer: Self-pay | Admitting: *Deleted

## 2022-02-28 NOTE — Telephone Encounter (Signed)
Patient called in stating there has been no improvement in her left arm pain. States she was told at [facility] on 4/13 next step would be MRI. ?

## 2022-03-04 ENCOUNTER — Other Ambulatory Visit (HOSPITAL_COMMUNITY): Payer: Self-pay | Admitting: Psychiatry

## 2022-03-04 ENCOUNTER — Other Ambulatory Visit (HOSPITAL_COMMUNITY): Payer: Self-pay

## 2022-03-04 ENCOUNTER — Other Ambulatory Visit: Payer: Self-pay | Admitting: Internal Medicine

## 2022-03-04 MED ORDER — INSULIN LISPRO PROT & LISPRO (75-25 MIX) 100 UNIT/ML KWIKPEN
24.0000 [IU] | PEN_INJECTOR | Freq: Two times a day (BID) | SUBCUTANEOUS | 11 refills | Status: DC
  Filled 2022-03-04: qty 15, 31d supply, fill #0
  Filled 2022-03-27: qty 15, 31d supply, fill #1
  Filled 2022-04-10 – 2022-04-27 (×2): qty 15, 31d supply, fill #2
  Filled 2022-06-11: qty 15, 31d supply, fill #3
  Filled 2022-07-15: qty 15, 31d supply, fill #4
  Filled 2022-08-18: qty 15, 31d supply, fill #5
  Filled 2022-09-15: qty 15, 31d supply, fill #6
  Filled 2022-10-24: qty 15, 31d supply, fill #7
  Filled 2022-11-24: qty 15, 31d supply, fill #8
  Filled 2022-12-28: qty 15, 31d supply, fill #9
  Filled 2023-02-09: qty 15, 31d supply, fill #10

## 2022-03-05 ENCOUNTER — Other Ambulatory Visit (HOSPITAL_COMMUNITY): Payer: Self-pay

## 2022-03-06 ENCOUNTER — Other Ambulatory Visit (HOSPITAL_COMMUNITY): Payer: Self-pay

## 2022-03-07 ENCOUNTER — Other Ambulatory Visit (HOSPITAL_COMMUNITY): Payer: Self-pay | Admitting: Psychiatry

## 2022-03-07 ENCOUNTER — Other Ambulatory Visit (HOSPITAL_COMMUNITY): Payer: Self-pay

## 2022-03-10 ENCOUNTER — Other Ambulatory Visit (HOSPITAL_COMMUNITY): Payer: Self-pay

## 2022-03-11 ENCOUNTER — Ambulatory Visit (INDEPENDENT_AMBULATORY_CARE_PROVIDER_SITE_OTHER): Payer: 59 | Admitting: Psychiatry

## 2022-03-11 ENCOUNTER — Ambulatory Visit: Payer: 59

## 2022-03-11 ENCOUNTER — Ambulatory Visit (INDEPENDENT_AMBULATORY_CARE_PROVIDER_SITE_OTHER): Payer: 59 | Admitting: Internal Medicine

## 2022-03-11 ENCOUNTER — Encounter: Payer: Self-pay | Admitting: Internal Medicine

## 2022-03-11 VITALS — BP 138/67 | HR 96 | Ht 76.0 in | Wt 282.8 lb

## 2022-03-11 DIAGNOSIS — M79602 Pain in left arm: Secondary | ICD-10-CM | POA: Diagnosis not present

## 2022-03-11 DIAGNOSIS — E785 Hyperlipidemia, unspecified: Secondary | ICD-10-CM

## 2022-03-11 DIAGNOSIS — E1169 Type 2 diabetes mellitus with other specified complication: Secondary | ICD-10-CM

## 2022-03-11 DIAGNOSIS — I10 Essential (primary) hypertension: Secondary | ICD-10-CM | POA: Diagnosis not present

## 2022-03-11 DIAGNOSIS — F3181 Bipolar II disorder: Secondary | ICD-10-CM | POA: Diagnosis not present

## 2022-03-11 DIAGNOSIS — Z3042 Encounter for surveillance of injectable contraceptive: Secondary | ICD-10-CM

## 2022-03-11 DIAGNOSIS — Z794 Long term (current) use of insulin: Secondary | ICD-10-CM

## 2022-03-11 DIAGNOSIS — D219 Benign neoplasm of connective and other soft tissue, unspecified: Secondary | ICD-10-CM

## 2022-03-11 DIAGNOSIS — F17210 Nicotine dependence, cigarettes, uncomplicated: Secondary | ICD-10-CM

## 2022-03-11 MED ORDER — MEDROXYPROGESTERONE ACETATE 104 MG/0.65ML ~~LOC~~ SUSY
104.0000 mg | PREFILLED_SYRINGE | Freq: Once | SUBCUTANEOUS | Status: AC
  Administered 2022-03-11: 104 mg via SUBCUTANEOUS

## 2022-03-11 NOTE — Progress Notes (Signed)
BH MD/PA/NP OP Progress Note ? ?03/11/2022 10:00 AM ?Jodi Marble [place]  ?MRN:  [id number] ? ?Virtual Visit via Telephone Note ? ?I connected with Tina [name] on 03/11/22 at  8:00 AM EDT by telephone and verified that I am speaking with the correct person using two identifiers. ? ?Location: ?Patient: home ?Provider: offsite ?  ?I discussed the limitations, risks, security and privacy concerns of performing an evaluation and management service by telephone and the availability of in person appointments. I also discussed with the patient that there may be a patient responsible charge related to this service. The patient expressed understanding and agreed to proceed. ? ? ?  ?I discussed the assessment and treatment plan with the patient. The patient was provided an opportunity to ask questions and all were answered. The patient agreed with the plan and demonstrated an understanding of the instructions. ?  ?The patient was advised to call back or seek an in-person evaluation if the symptoms worsen or if the condition fails to improve as anticipated. ? ?I provided 10 minutes of non-face-to-face time during this encounter. ? ? ?Franne Grip, NP  ? ?Chief Complaint: Medication management ? ?HPI: Tina [name] is a 51 year old female presenting to [facility] for a follow up psychiatric evaluation. Patient has a psychiatric history of  Insomnia, GAD, Bipolar 2 disorder, and major depression. Her symptoms are managed with Ambien 5 mg at bedtime as needed for sleep, Nicorette 4 mg gum as needed, Lamictal 100 mg daily, gabapentin 300 mg,  Buspar 15 mg three times daily, and Abilify 20 mg daily.  Patient is medication compliant and meds are effective.  No adverse medication effects or need for dosage adjustment today.  No medication changes. ? ?Visit Diagnosis:  ?  ICD-10-CM   ?1. Bipolar 2 disorder, major depressive episode ([place])  F31.81   ?  ? ? ?Past Psychiatric  History: Insomnia, GAD, Bipolar 2 disorder, major depression  ? ?Past Medical History:  ?Past Medical History:  ?Diagnosis Date  ? Diabetes mellitus   ? type II  ? Fibroids   ? HTN (hypertension)   ? Hyperlipidemia   ? Iron deficiency anemia   ? Left acetabular fracture ([place])   ? Lumbar strain   ? Obesity   ? Ovarian cyst   ?  ?Past Surgical History:  ?Procedure Laterality Date  ? FOOT ARTHRODESIS, MODIFIED MCBRIDE    ? right foot bunion surgery and ankle surgery 1980s  ? ORIF ACETABULAR FRACTURE    ? 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 ? ?Family Psychiatric History: n/a ? ?Family History:  ?Family History  ?Problem Relation Age of Onset  ? Colon cancer Sister   ? Diabetes Mother   ? Diabetes Father   ? Diabetes Brother   ? Celiac disease Paternal Aunt   ? ? ?Social History:  ?Social History  ? ?Socioeconomic History  ? Marital status: Single  ?  Spouse name: Not on file  ? Number of children: 0  ? Years of education: 54  ? Highest education level: Some college, no degree  ?Occupational History  ? Occupation: salesperson  ?  Employer: UNEMPLOYED  ?  Employer: [employer]  ?Tobacco Use  ? Smoking status: Every Day  ?  Packs/day: 0.50  ?  Years: 23.00  ?  Pack years: 11.50  ?  Types: Cigarettes  ? Smokeless tobacco: Never  ? Tobacco comments:  ?  1/2PPD  ?Vaping Use  ? Vaping  Use: Never used  ?Substance and Sexual Activity  ? Alcohol use: Yes  ?  Alcohol/week: 0.0 standard drinks  ?  Comment: occ  ? Drug use: No  ? Sexual activity: Yes  ?  Partners: Female  ?  Birth control/protection: Injection  ?  Comment: Depo  ?Other Topics Concern  ? Not on file  ?Social History Narrative  ? Homosexual; in a monogamous relationship w/ homosexual woman.  ? She is disabled.  She last worked in 2018 at [employer].   ? Right handed  ? One story home  ? Caffeine: 1 soda daily, occas drinks coffee   ? ?Social Determinants of Health  ? ?Financial Resource Strain: Not on file  ?Food Insecurity: Not on file  ?Transportation Needs: Not on file   ?Physical Activity: Not on file  ?Stress: Not on file  ?Social Connections: Not on file  ? ? ?Allergies:  ?Allergies  ?Allergen Reactions  ? Codeine Other (See Comments)  ?  hallucinations  ? ? ?Metabolic Disorder Labs: ?Lab Results  ?Component Value Date  ? HGBA1C 7.4 (A) 01/14/2022  ? MPG 200 05/25/2017  ? MPG 237 02/26/2016  ? ?No results found for: PROLACTIN ?Lab Results  ?Component Value Date  ? CHOL 154 08/08/2020  ? TRIG 142 08/08/2020  ? HDL 35 (L) 08/08/2020  ? CHOLHDL 4.4 08/08/2020  ? VLDL 50 (H) 08/21/2014  ? [place] 94 08/08/2020  ? [place] 96 01/07/2019  ? ?Lab Results  ?Component Value Date  ? TSH 1.074 06/27/2011  ? ? ?Therapeutic Level Labs: ?No results found for: LITHIUM ?No results found for: VALPROATE ?No components found for:  CBMZ ? ?Current Medications: ?Current Outpatient Medications  ?Medication Sig Dispense Refill  ? amLODipine-olmesartan (AZOR) 5-20 MG tablet Take 1 tablet by mouth daily. 90 tablet 1  ? ARIPiprazole (ABILIFY) 20 MG tablet Take 1 tablet (20 mg total) by mouth daily. 30 tablet 3  ? atorvastatin (LIPITOR) 40 MG tablet Take 1 tablet (40 mg total) by mouth daily. 100 tablet 2  ? Blood Glucose Monitoring Suppl (CONTOUR NEXT ONE) KIT 1 each by Does not apply route See admin instructions. USE TO CHECK BLOOD GLUCOSE ONCE DAILY. IM PROGRAM. 1 kit 0  ? busPIRone (BUSPAR) 15 MG tablet Take 1 tablet (15 mg total) by mouth 3 (three) times daily. 90 tablet 3  ? Continuous Blood Gluc Sensor (FREESTYLE LIBRE 2 SENSOR) MISC Check blood sugar at least 6 times a day 6 each 3  ? empagliflozin (JARDIANCE) 10 MG TABS tablet Take 1 tablet (10 mg total) by mouth daily before breakfast. 90 tablet 1  ? gabapentin (NEURONTIN) 300 MG capsule TAKE 1 CAPSULE BY MOUTH 3 TIMES DAILY FOR 3 DAYS, THEN 2 CAPSULES BY MOUTH 3 TIMES DAILY. 549 capsule 0  ? glucose blood (CONTOUR NEXT TEST) test strip Please use to check your sugar 3 times a day. 100 strip 3  ? Insulin Lispro Prot & Lispro (HUMALOG MIX 75/25  KWIKPEN) (75-25) 100 UNIT/ML Kwikpen Inject 24 Units into the skin in the morning and at bedtime. 15 mL 11  ? Insulin Pen Needle 32G X 4 MM MISC Use daily at 6pm 100 each 4  ? lamoTRIgine (LAMICTAL) 100 MG tablet Take 1 tablet (100 mg total) by mouth daily. 30 tablet 2  ? Lancets MISC 1 Units by Does not apply route 3 (three) times daily. 100 each 3  ? meloxicam (MOBIC) 15 MG tablet Take 1 tablet (15 mg total) by mouth daily. [place]  tablet 0  ? metFORMIN (GLUCOPHAGE) 1000 MG tablet Take 1 tablet (1,000 mg total) by mouth 2 (two) times daily with a meal. 180 tablet 1  ? Multiple Vitamins-Minerals (MULTIVITAMIN WITH MINERALS) tablet Take 1 tablet by mouth daily.    ? naproxen (NAPROSYN) 500 MG tablet Take 1 tablet (500 mg total) by mouth 2 (two) times daily with a meal. 60 tablet 0  ? nicotine polacrilex (NICORETTE) 4 MG gum Use 1 piece of gum (4 mg total) by mouth as needed for smoking cessation. 110 tablet 3  ? Semaglutide, 1 MG/DOSE, (OZEMPIC, 1 MG/DOSE,) 4 MG/3ML SOPN Inject 1 mg into the skin once a week. 3 mL 2  ? traMADol (ULTRAM) 50 MG tablet Take 1 tablet (50 mg total) by mouth every 12 (twelve) hours as needed. 20 tablet 0  ? triamcinolone cream (KENALOG) 0.1 % Apply 1 application topically 2 (two) times daily. 30 g 0  ? zolpidem (AMBIEN) 5 MG tablet Take 1 tablet (5 mg total) by mouth at bedtime as needed for sleep. 30 tablet 2  ? ?No current facility-administered medications for this visit.  ? ? ? ?Musculoskeletal: ?Strength & Muscle Tone: n/a virtual visit ?Gait & Station: n/a ?Patient leans: N/A ? ?Psychiatric Specialty Exam: ?Review of Systems  ?Psychiatric/Behavioral:  Negative for hallucinations, self-injury and suicidal ideas.   ?All other systems reviewed and are negative.  ?There were no vitals taken for this visit.There is no height or weight on file to calculate BMI.  ?General Appearance: NA  ?Eye Contact:  NA  ?Speech:  Clear and Coherent  ?Volume:  Normal  ?Mood:  Euthymic  ?Affect:  Congruent   ?Thought Process:  Goal Directed  ?Orientation:  Full (Time, Place, and Person)  ?Thought Content: Logical   ?Suicidal Thoughts:  No  ?Homicidal Thoughts:  No  ?Memory:  good  ?Judgement:  Good  ?Insight:  Good

## 2022-03-11 NOTE — Assessment & Plan Note (Signed)
Depo injections used to manage heavy bleeding/cramping. Patient has no complaints. ?- depot injection today ?

## 2022-03-11 NOTE — Assessment & Plan Note (Signed)
BP improved on recheck to 138/67 ?

## 2022-03-11 NOTE — Assessment & Plan Note (Signed)
F/u lipid panel today ?

## 2022-03-11 NOTE — Patient Instructions (Signed)
Tina [name] ? ?It was a pleasure seeing you in the clinic today.  ? ?We talked about your arm pain. I am going to refer you to sports medicine where they can take another look at your arm.  ? ?Please call our clinic at [phone] if you have any questions or concerns. The best time to call is Monday-Friday from 9am-4pm, but there is someone available 24/7 at the same number. If you need medication refills, please notify your pharmacy one week in advance and they will send [place] a request. ?  ?Thank you for letting us take part in your care. We look forward to seeing you next time! ? ?

## 2022-03-11 NOTE — Assessment & Plan Note (Signed)
Patient has had left arm pain after a fall in the shower about a month ago. XR at that time was negative for fracture. She has taken naproxen with out relief. She was told that she might need an MRI if it did not get better. On exam today there is no swelling, warmth, ecchymoses, erythema, fluctuance, or signs of a hematoma. Discussed with patient that even if she had a partial tear that management would likely be conservative, however she could go speak to the sports medicine doctors and they may be able to take another look at her arm in clinic with an ultrasound. Patient was agreeable to this plan. ?- sports medicine referral ?

## 2022-03-11 NOTE — Progress Notes (Signed)
? ?  CC: arm pain ? ?HPI: ? ?[name] is a 51 y.o. PMH noted below, who presents to the [facility] with complaints of arm pain. To see the management of his acute and chronic conditions, please refer to the A&P note under the encounters tab.  ? ?Past Medical History:  ?Diagnosis Date  ? Diabetes mellitus   ? type II  ? Fibroids   ? HTN (hypertension)   ? Hyperlipidemia   ? Iron deficiency anemia   ? Left acetabular fracture ([place])   ? Lumbar strain   ? Obesity   ? Ovarian cyst   ? ?Review of Systems:  positive for arm pain, negative for paraesthesias, radiation, or muscle weakness ? ?Physical Exam: ?Gen: middle aged woman in NAD ?HEENT: normocephalic atraumatic, MMM ?CV: RRR, no m/r/g   ?Resp: CTAB, normal WOB  ?GI: soft, nontender ?MSK: limited active and passive range of motion of left upper extremity 2/2 pain however normal strength testing, sensation intact ?Skin:warm and dry ?Neuro:alert answering questions appropriately ?Psych: normal affect ? ? ?Assessment & Plan:  ? ?See Encounters Tab for problem based charting. ? ?Patient discussed with Dr. Philipp [name]  ? ?

## 2022-03-12 LAB — BMP8+ANION GAP
Anion Gap: 21 mmol/L — ABNORMAL HIGH (ref 10.0–18.0)
BUN/Creatinine Ratio: 12 (ref 9–23)
BUN: 9 mg/dL (ref 6–24)
CO2: 18 mmol/L — ABNORMAL LOW (ref 20–29)
Calcium: 9.7 mg/dL (ref 8.7–10.2)
Chloride: 101 mmol/L (ref 96–106)
Creatinine, Ser: 0.77 mg/dL (ref 0.57–1.00)
Glucose: 190 mg/dL — ABNORMAL HIGH (ref 70–99)
Potassium: 4.2 mmol/L (ref 3.5–5.2)
Sodium: 140 mmol/L (ref 134–144)
eGFR: 93 mL/min/{1.73_m2} (ref >59)

## 2022-03-12 LAB — LIPID PANEL
Chol/HDL Ratio: 5 ratio — ABNORMAL HIGH (ref 0.0–4.4)
Cholesterol, Total: 164 mg/dL (ref 100–199)
HDL: 33 mg/dL — ABNORMAL LOW (ref >39)
LDL Chol Calc (NIH): 97 mg/dL (ref 0–99)
Triglycerides: 195 mg/dL — ABNORMAL HIGH (ref 0–149)
VLDL Cholesterol Cal: 34 mg/dL (ref 5–40)

## 2022-03-14 ENCOUNTER — Other Ambulatory Visit (HOSPITAL_COMMUNITY): Payer: Self-pay

## 2022-03-14 ENCOUNTER — Other Ambulatory Visit (HOSPITAL_COMMUNITY): Payer: Self-pay | Admitting: Psychiatry

## 2022-03-14 ENCOUNTER — Telehealth (HOSPITAL_COMMUNITY): Payer: 59 | Admitting: Psychiatry

## 2022-03-17 ENCOUNTER — Other Ambulatory Visit (HOSPITAL_COMMUNITY): Payer: Self-pay

## 2022-03-18 ENCOUNTER — Ambulatory Visit: Payer: 59 | Admitting: Podiatry

## 2022-03-21 ENCOUNTER — Ambulatory Visit (INDEPENDENT_AMBULATORY_CARE_PROVIDER_SITE_OTHER): Payer: 59 | Admitting: Family Medicine

## 2022-03-21 ENCOUNTER — Encounter: Payer: Self-pay | Admitting: Family Medicine

## 2022-03-21 VITALS — BP 131/66 | Ht 76.0 in | Wt 282.0 lb

## 2022-03-21 DIAGNOSIS — E1169 Type 2 diabetes mellitus with other specified complication: Secondary | ICD-10-CM

## 2022-03-21 DIAGNOSIS — Z794 Long term (current) use of insulin: Secondary | ICD-10-CM

## 2022-03-21 DIAGNOSIS — M12812 Other specific arthropathies, not elsewhere classified, left shoulder: Secondary | ICD-10-CM | POA: Diagnosis not present

## 2022-03-21 DIAGNOSIS — M25512 Pain in left shoulder: Secondary | ICD-10-CM

## 2022-03-21 MED ORDER — METHYLPREDNISOLONE ACETATE 40 MG/ML IJ SUSP
40.0000 mg | Freq: Once | INTRAMUSCULAR | Status: AC
  Administered 2022-03-21: 40 mg via INTRA_ARTICULAR

## 2022-03-21 NOTE — Progress Notes (Signed)
[facility]: Attending Note: I have reviewed the chart, discussed wit the Sports Medicine Fellow. I agree with assessment and treatment plan as detailed in the Fellow's note.  

## 2022-03-21 NOTE — Progress Notes (Signed)
PCP: [name], MD  Subjective:   HPI: Tina [name] is a 51 y.o. female here for evaluation of left shoulder and upper arm pain.  Patient states that back on March 26 she was down in [place] where she slipped in the shower and fell onto her left shoulder and arm onto the edge of the shower.  She had quite significant pain at that time and when she came home her pain was still persisting so she saw her primary doctor and they obtained x-rays of the shoulder and humerus.  This did not show any evidence of fracture or acute abnormality.  She has tried multiple medication including meloxicam, naproxen, tramadol as well as topical medications which have not helped relieve her pain.  The pain feels like a deep ache but sometimes will be sharp in the deltoid and will radiate down the proximal arm to just above the elbow.  She denies any numbness tingling or radicular pain.  No pain going into her hands or fingers.  She does note quite limited range of motion as she is unable to reach behind her or above her head.  She does have type 2 diabetes but reports her last A1c was 7.  BP 131/66   Ht '6\' 4"'$  (1.93 m)   Wt 282 lb (127.9 kg)   BMI 34.33 kg/m       View : No data to display.              View : No data to display.              Objective:  Physical Exam:  Gen: Well-appearing, in no acute distress; non-toxic CV: Regular Rate. Well-perfused. Warm.  Resp: Breathing unlabored on room air; no wheezing. Psych: Fluid speech in conversation; appropriate affect; normal thought process Neuro: Sensation intact throughout. No gross coordination deficits.  MSK:  - Left shoulder: No significant TTP of the [facility] joint or within the bicipital groove, or edge of the acromion.  There is some mild TTP at the olecranon.  No effusion, overlying erythema or bruising.  She does have limited active range of motion.  Forward flexion to approximately 115 degrees, I am able to take her passively to about 150  degrees albeit with pain.  Active abduction to 90 degrees, able to be taken further to about 110 degrees, although still somewhat stiff passively.  She has some pain with resisted external rotation and empty can testing.  Limited internal rotation with positive impingement and Gerber liftoff test.  Strength 5/5 throughout.  Peripheral pulses intact distally.   Assessment & Plan:  1.  Left shoulder pain -  after fall on January 26, 2022 2.  Left rotator cuff arthropathy 3.  Type II DM  Procedure: Subacromial Injection, left Shoulder  After discussion on risks, benefits, and indications, informed written consent was obtained. A timeout was then performed. Patient was seated in chair in exam room. The patient's left shoulder was prepped with alcohol swabs and utilizing posterolateral approach the patient's subacromial space was injected with 4:1 mixture of lidocaine:depomedrol. Patient tolerated the procedure well without immediate complications.   A/P: I had a lengthy discussion with Tina [name] regarding the possible etiology of her shoulder pain.  It is reassuring that her x-rays were negative for any evidence of fracture.  She is somewhat stiff on exam, although I think this is more from the rotator cuff arthropathy (vs. True adhesive capsulitis) as I am able to take her further passively.  I think  she stopped using the arm as much because of the pain and her stiffness is now the main driver of her pain.  Through shared decision-making, we elected to proceed with subacromial joint injection today.  We will also send her to formalized physical therapy and did provide some exercises for the rotator cuff and range of motion that she may do at home in the meantime.  Given her diabetes, we only use 1 cc of a steroid and did recommend that she monitor her sugar levels over the next few days.  She may continue over-the-counter anti-inflammatories.  We will follow-up in 1 month for reevaluation.  If she is somewhat  stiff still at that appointment, we may consider a glenohumeral joint injection under ultrasound guidance vs. Full ultrasound evaluation/MRI.  Tina Barman, DO PGY-4, Sports Medicine Fellow [place]  This note was dictated using Dragon naturally speaking software and may contain errors in syntax, spelling, or content which have not been identified prior to signing this note.

## 2022-03-24 NOTE — Progress Notes (Signed)
Internal Medicine Clinic Attending ° °Case discussed with Dr. DeMaio  At the time of the visit.  We reviewed the resident’s history and exam and pertinent patient test results.  I agree with the assessment, diagnosis, and plan of care documented in the resident’s note. ° ° °

## 2022-03-27 ENCOUNTER — Other Ambulatory Visit (HOSPITAL_COMMUNITY): Payer: Self-pay

## 2022-03-27 ENCOUNTER — Other Ambulatory Visit (HOSPITAL_COMMUNITY): Payer: Self-pay | Admitting: Psychiatry

## 2022-03-27 DIAGNOSIS — F411 Generalized anxiety disorder: Secondary | ICD-10-CM

## 2022-03-27 MED ORDER — SERTRALINE HCL 50 MG PO TABS
50.0000 mg | ORAL_TABLET | Freq: Every day | ORAL | 3 refills | Status: DC
  Filled 2022-03-27: qty 90, 90d supply, fill #0

## 2022-03-27 NOTE — Progress Notes (Signed)
[facility] PREP Evaluation  Patient Details  Name: Tina [name] MRN: [id number] Date of Birth: [birth year] Age: 51 y.o. PCP: [name], MD  Vitals:   03/25/22 1630  BP: 110/78  Pulse: 96  SpO2: 97%  Weight: 284 lb 9.6 oz (129.1 kg)     [facility] Eval - 03/27/22 1600       [facility] "PREP" Location   [facility] "PREP" Location [facility]      Referral    Referring Provider [facility] Start Date --   final class day for PREP 03/17/22     Measurement   Waist Circumference 50.5 inches    Hip Circumference 51.5 inches    Body fat 41.4 percent      Information for Trainer   Goals Likely to have shoulder surgery      Mobility and Daily Activities   I find it easy to walk up or down two or more flights of stairs. 2    I have no trouble taking out the trash. 4    I do housework such as vacuuming and dusting on my own without difficulty. 2    I can easily lift a gallon of milk (8lbs). 4    I can easily walk a mile. 1    I have no trouble reaching into high cupboards or reaching down to pick up something from the floor. 4    I do not have trouble doing out-door work such as Armed forces logistics/support/administrative officer, raking leaves, or gardening. 2      Mobility and Daily Activities   I feel younger than my age. 4    I feel independent. 4    I feel energetic. 2    I live an active life.  3    I feel strong. 2    I feel healthy. 2    I feel active as other people my age. 1      How fit and strong are you.   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  Will likely need surgery on left shoulder in PT now.  Encouraged to continue to use the left arm tends to hold it stiff and that therapy will be very important after Attended >18 workouts and 11 of 12 educational sessions  Barnett Hatter 03/27/2022, 4:19 PM

## 2022-03-28 ENCOUNTER — Other Ambulatory Visit (HOSPITAL_COMMUNITY): Payer: Self-pay

## 2022-04-09 NOTE — Therapy (Signed)
OUTPATIENT PHYSICAL THERAPY SHOULDER EVALUATION   Patient Name: Tina [name] MRN: [id number] DOB:11/18/1970, 51 y.o., female Today's Date: 04/11/2022   PT End of Session - 04/11/22 0528     Visit Number 1    Number of Visits 13    Date for PT Re-Evaluation 05/30/22    Authorization Type [employer] DUAL COMPLETE; MEDICAID [place] ACCESS    Progress Note Due on Visit 10    PT Start Time 0935    PT Stop Time 1020    PT Time Calculation (min) 45 min    Activity Tolerance Patient tolerated treatment well    Behavior During Therapy WFL for tasks assessed/performed             Past Medical History:  Diagnosis Date   Diabetes mellitus    type II   Fibroids    HTN (hypertension)    Hyperlipidemia    Iron deficiency anemia    Left acetabular fracture ([place])    Lumbar strain    Obesity    Ovarian cyst    Past Surgical History:  Procedure Laterality Date   FOOT ARTHRODESIS, MODIFIED [facility]     right foot bunion surgery and ankle surgery 1980s   ORIF ACETABULAR FRACTURE     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   Patient Active Problem List   Diagnosis Date Noted   Trochanteric bursitis of left hip 01/21/2022   Arm pain, left 01/21/2022   At risk for obstructive sleep apnea 01/08/2022   Snoring 01/08/2022   Insomnia due to mental condition 01/08/2022   Other chronic pain 01/08/2022   Grief reaction 01/08/2022   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   Insomnia due to other mental disorder 03/18/2021   Generalized anxiety disorder 11/12/2020   Severe recurrent major depression with psychotic features ([place]) 10/04/2020   Bipolar 2 disorder, major depressive episode ([place]) 10/04/2020   Fibroids 04/14/2019   Seasonal allergies 02/06/2017   Osteoarthritis of right ankle 08/29/2016   Tobacco abuse 01/21/2016   Severe obesity (BMI 35.0-39.9) 10/12/2015   Ovarian cyst 12/24/2012   Hypertension 07/20/2012   Healthcare maintenance 06/27/2011   Type  2 diabetes mellitus ([place]) 05/30/2010   Dyslipidemia 05/30/2010   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    PCP: [name], MD  REFERRING PROVIDER:   Dickie La, MD    REFERRING DIAG: [phone] (ICD-10-CM) - Left shoulder pain, unspecified chronicity  THERAPY DIAG:  Chronic left shoulder pain - Plan: PT plan of care cert/re-cert  Muscle weakness (generalized) - Plan: PT plan of care cert/re-cert  Stiffness of left shoulder, not elsewhere classified - Plan: PT plan of care cert/re-cert  Rationale for Evaluation and Treatment Rehabilitation  ONSET DATE: 01/26/22  SUBJECTIVE:  SUBJECTIVE STATEMENT: MOI: Slipped stepping out of a shower/tub in a hotel when on vacation falling on to her L side. Pt reports there was no non-skid mat in the shower/tub. Afterwards she noticed her L shoulder was hurting. Pt feels like her shoulder is getting worse since the incident. Pt reports a catch or spasm with L arm movements. Pt is R handed  PERTINENT HISTORY: DM2, obesity, HTN  PAIN:  Are you having pain? Yes: NPRS scale: 8/10 Pain location: L shoulder laterally to L elbow Pain description: ache, sharp Aggravating factors: Sleeping, putting on a shirt, cooking,movement Relieving factors: Sitting with arm supported  0/10 supported at rest  PRECAUTIONS: None  WEIGHT BEARING RESTRICTIONS No  FALLS:  Has patient fallen in last 6 months? Yes. Number of falls 1 Slip in shower.  LIVING ENVIRONMENT: Lives with: lives with their family Lives in: House/apartment No issues with accessing or mobility within home  OCCUPATION: [employer]  PLOF: Independent  PATIENT GOALS To get my arm feeling better  OBJECTIVE:   DIAGNOSTIC FINDINGS:  02/14/22 FINDINGS: Minimal inferior glenoid degenerative  osteophytosis. The glenohumeral joint space is maintained. No significant degenerative change of the acromioclavicular joint. No acute fracture is seen within the left shoulder or more distal aspect of the left humerus.   IMPRESSION: No acute fracture.  PATIENT SURVEYS:  FOTO 44% predicted 58%  COGNITION:  Overall cognitive status: Within functional limits for tasks assessed     SENSATION: WFL  POSTURE: Forward head and rounded shoulders  UPPER EXTREMITY ROM:   Active ROM Right eval Left eval  Shoulder flexion 150 72 AAROM wall slide= 110  Shoulder extension    Shoulder abduction 139 53  Shoulder adduction    Shoulder internal rotation  To L ear  Shoulder external rotation  To L latral hip  Elbow flexion    Elbow extension    Wrist flexion    Wrist extension    Wrist ulnar deviation    Wrist radial deviation    Wrist pronation    Wrist supination    -All active movements were limited by pain -PROMs were greater than AROMs c pain near end range of motions except for ER which was painful at mid range  (Blank rows = not tested)  UPPER EXTREMITY MMT:  MMT Right eval Left eval  Shoulder flexion  2  Shoulder extension  2  Shoulder abduction  2  Shoulder adduction    Shoulder internal rotation  2  Shoulder external rotation  2  Middle trapezius    Lower trapezius    Elbow flexion    Elbow extension    Wrist flexion    Wrist extension    Wrist ulnar deviation    Wrist radial deviation    Wrist pronation    Wrist supination    Grip strength (lbs)    Resisted L shoulder movements were completed in a neutral shoulder position. IR was painful, while all other movements did not provoke pain   (Blank rows = not tested)  SHOULDER SPECIAL TESTS:  Impingement tests: Hawkins/Kennedy impingement test: positive   SLAP lesions: Crank test: negativean tests were painful , but not overtly weak  Rotator cuff assessment: Empty and full   Biceps assessment: Yergason's  test: positive   PALPATION:  TTP mid lateral L arm and lateral border of the L scapula Sweeling noted of the L elbow   TODAY'S TREATMENT:  - Shoulder External Rotation 10 reps - 3 hold YTB - Shoulder extension 10  reps - 3 hold YTB - Standing Shoulder Row with Anchored Resistance 10 reps - 3 hold YTB - Shoulder Internal Rotation with Resistance 10 reps - 3 hold YTB - Shoulder Flexion Wall Slide with Towel  10 reps - 3 hold YTB - Seated Shoulder Flexion Towel Slide at Table Top Full Range of Motion  5 reps - 3 hold - Standing Shoulder External Rotation Stretch in Doorway 3 reps - 20 hold   PATIENT EDUCATION: Education details: Eval, POC, HEP, ice ack for pain management Person educated: Patient Education method: Explanation, Demonstration, Tactile cues, Verbal cues, and Handouts Education comprehension: verbalized understanding, returned demonstration, verbal cues required, tactile cues required, and needs further education   HOME EXERCISE PROGRAM: Access Code: [email or web address] URL: [email or web address] Date: 04/10/2022 Prepared by: Gar Ponto  Exercises - Shoulder External Rotation with Anchored Resistance  - 1 x daily - 7 x weekly - 3 sets - 10 reps - 3 hold - Shoulder extension with resistance - Neutral  - 1 x daily - 7 x weekly - 3 sets - 10 reps - 3 hold - Standing Shoulder Row with Anchored Resistance  - 1 x daily - 7 x weekly - 3 sets - 10 reps - 3 hold - Shoulder Internal Rotation with Resistance  - 1 x daily - 7 x weekly - 3 sets - 10 reps - 3 hold - Shoulder Flexion Wall Slide with Towel  - 1 x daily - 7 x weekly - 1 sets - 10 reps - 3 hold - Seated Shoulder Flexion Towel Slide at Table Top Full Range of Motion  - 1 x daily - 7 x weekly - 1 sets - 10 reps - 3 hold - Standing Shoulder External Rotation Stretch in Doorway  - 2 x daily - 7 x weekly - 1 sets - 3 reps - 20 hold  ASSESSMENT:  CLINICAL IMPRESSION: Patient is a 51 y.o. F who was seen today for  physical therapy evaluation and treatment for Chronic L shoulder pain following a fall. Pt's L shoulder is painful and weak, but pt is able to provide resistance with strength testing. Pt's rotator cuff muscles appear to be impacted with IR being the most sympmtomatic. PT was initiated on light ROM and strengthening exs which the pt tolerated without adverse effects.  OBJECTIVE IMPAIRMENTS decreased ROM, decreased strength, increased edema, increased muscle spasms, impaired UE functional use, obesity, and pain.   ACTIVITY LIMITATIONS carrying, lifting, bathing, toileting, dressing, reach over head, and hygiene/grooming  PARTICIPATION LIMITATIONS: meal prep, cleaning, laundry, driving, and shopping  PERSONAL FACTORS Time since onset of injury/illness/exacerbation and 1-2 comorbidities: M2 and obesity  are also affecting patient's functional outcome.   REHAB POTENTIAL: Good  CLINICAL DECISION MAKING: Stable/uncomplicated  EVALUATION COMPLEXITY: Low   GOALS:  SHORT TERM GOALS: Target date: 05/02/2022  Pt will be Ind in an initial HEP Baseline:started on eval Goal status: INITIAL  LONG TERM GOALS: Target date: 12/31/21  Increase pt's AROM of the the L shoulder to 90% of the R for improved L UE function with dressing and coking Baseline: see flow sheets Goal status: INITIAL  2.  Increase pt's L shoulder strength to at least 4+ for improved function of the L shoulder Baseline: see flow sheets Goal status: INITIAL  3.  Pt will report a decrease in L shoulder pain c daily activities to 2/10 at less for improved L shoulder function and QOL  Baseline: Goal status: INITIAL  4.  Pt' FOTO score will increase  to the predicted value of 58% as indication of improved function Baseline: 44% Goal status: INITIAL  5.  Pt will be Ind  in a final HEP to maintain achieved LOF and QOL Baseline:  Goal status: INITIAL  PLAN: PT FREQUENCY: 2x/week  PT DURATION: 6 weeks  PLANNED INTERVENTIONS:  Therapeutic exercises, Therapeutic activity, Patient/Family education, Joint mobilization, Aquatic Therapy, Dry Needling, Electrical stimulation, Cryotherapy, Moist heat, Taping, Ultrasound, Ionotophoresis '4mg'$ /ml Dexamethasone, Manual therapy, and Re-evaluation  PLAN FOR NEXT SESSION: Assess response to HEP; review FOTO; pogress therex as indicated; use of modalities, manual therapy, TPDN as indicated   [employer] MS, PT 04/11/22 7:23 AM

## 2022-04-10 ENCOUNTER — Other Ambulatory Visit: Payer: Self-pay | Admitting: Internal Medicine

## 2022-04-10 ENCOUNTER — Other Ambulatory Visit (HOSPITAL_COMMUNITY): Payer: Self-pay | Admitting: Psychiatry

## 2022-04-10 ENCOUNTER — Ambulatory Visit: Payer: 59 | Attending: Family Medicine

## 2022-04-10 ENCOUNTER — Other Ambulatory Visit: Payer: Self-pay

## 2022-04-10 ENCOUNTER — Other Ambulatory Visit (HOSPITAL_COMMUNITY): Payer: Self-pay

## 2022-04-10 DIAGNOSIS — M25612 Stiffness of left shoulder, not elsewhere classified: Secondary | ICD-10-CM | POA: Insufficient documentation

## 2022-04-10 DIAGNOSIS — M6281 Muscle weakness (generalized): Secondary | ICD-10-CM | POA: Diagnosis present

## 2022-04-10 DIAGNOSIS — G8929 Other chronic pain: Secondary | ICD-10-CM | POA: Diagnosis present

## 2022-04-10 DIAGNOSIS — M25512 Pain in left shoulder: Secondary | ICD-10-CM | POA: Insufficient documentation

## 2022-04-10 DIAGNOSIS — E1165 Type 2 diabetes mellitus with hyperglycemia: Secondary | ICD-10-CM

## 2022-04-10 MED ORDER — GABAPENTIN 300 MG PO CAPS
ORAL_CAPSULE | ORAL | 0 refills | Status: DC
  Filled 2022-04-10: qty 549, 93d supply, fill #0

## 2022-04-11 ENCOUNTER — Other Ambulatory Visit (HOSPITAL_COMMUNITY): Payer: Self-pay

## 2022-04-11 ENCOUNTER — Ambulatory Visit (INDEPENDENT_AMBULATORY_CARE_PROVIDER_SITE_OTHER): Payer: 59 | Admitting: Family Medicine

## 2022-04-11 ENCOUNTER — Encounter: Payer: Self-pay | Admitting: Family Medicine

## 2022-04-11 ENCOUNTER — Ambulatory Visit: Payer: Self-pay

## 2022-04-11 VITALS — BP 124/66 | Ht 76.0 in | Wt 282.0 lb

## 2022-04-11 DIAGNOSIS — S46312D Strain of muscle, fascia and tendon of triceps, left arm, subsequent encounter: Secondary | ICD-10-CM

## 2022-04-11 DIAGNOSIS — M25512 Pain in left shoulder: Secondary | ICD-10-CM

## 2022-04-11 MED ORDER — NITROGLYCERIN 0.2 MG/HR TD PT24
MEDICATED_PATCH | TRANSDERMAL | 1 refills | Status: DC
  Filled 2022-04-11: qty 21, 84d supply, fill #0
  Filled 2022-04-27 – 2022-07-15 (×2): qty 21, 84d supply, fill #1
  Filled 2022-11-24: qty 18, 72d supply, fill #2

## 2022-04-11 MED ORDER — CYCLOBENZAPRINE HCL 5 MG PO TABS
5.0000 mg | ORAL_TABLET | Freq: Every evening | ORAL | 1 refills | Status: DC | PRN
  Filled 2022-04-11: qty 30, 30d supply, fill #0
  Filled 2022-04-27 – 2022-05-09 (×3): qty 30, 30d supply, fill #1

## 2022-04-11 NOTE — Patient Instructions (Signed)
As we saw on ultrasound you have a small muscle tear in your left upper arm/shoulder area.  Eventually, it will get better on its own but will certainly get better if you continue in physical therapy.  As we discussed, I also think adding nitroglycerin patch therapy will speed the timeline up.  The directions for that are below.  I would like to see you back in clinic in 3 to 5 weeks.  If you have significant problems with headache or other symptoms from the nitroglycerin patch, just discontinue it and call the office and let me know.  Additionally I am giving you medications to take at night so that you will have less pain and are able to sleep better.  Please let me know if you have problems with this. Cut patch into one - fourth pieces Place a one fourth piece of patch on  skin over affected area, changing to a new piece every 24 hours.   You may experience a headache during the first 1-2 days and maybe up to 2 weeks of using the patch; these should improve and go away. If you experience headaches after beginning nitroglycerin patch treatment, you may take your preferred over the counter pain medicine such as tylenol or advil as directed on the box. Another side effect of the nitroglycerin patch can be skin irritation  or rash related to the patch adhesive.   Please notify our office if you develop  severe headaches or rash, and stop the patch.  Please call our office with any questions or problems. Tendon healing with nitroglycerin patch may require 12 to 24 weeks depending on the extent of injury. Men should not use if taking Viagra, Cialis, or Levitra as it may cause an unsafe lowering of the blood pressure..  Use with caution if you have migraines or rosacea.

## 2022-04-11 NOTE — Progress Notes (Signed)
    SUBJECTIVE:   CHIEF COMPLAINT / HPI:   Left shoulder and upper arm pain Patient initially seen March 26 in [place] after a fall in the shower.  X-rays were negative with no evidence of fracture.  Was evaluated in sports medicine clinic on 03/21/2022 where subacromial injection was performed on left shoulder.  She was also sent for formal physical therapy exercises.  She returns today for follow-up evaluation.  Patient reports that since having the shoulder injection she felt like the injection actually made the pain worse.  She has had continued pain.  She had her first day of physical therapy yesterday and there was some exercises that she was unable to do.  She has been taking intermittent ibuprofen to help with the pain with little relief.  Reports that she has been doing exercises at home as well and does not feel like that is helping much.  Is using capsaicin cream with no relief from that either.  OBJECTIVE:   BP 124/66   Ht '6\' 4"'$  (1.93 m)   Wt 282 lb (127.9 kg)   BMI 34.33 kg/m   General: Pleasant 51 year old female, no acute distress Cardiac: Regular rate Respiratory: Work of breathing, speaking full sentences MSK:  Left shoulder exam: No tenderness to palpation of the AC joint.  There is tenderness along triceps as well as biceps.  Left olecranon bursitis appreciated with no warmth or erythema of the bursa.  Limited active range of motion.  Also limited passive range of motion due to pain.  Active abduction to 90 degrees but with passive able to extend slightly further.  Still remains stiff with passive range of motion.  Limited internal rotation as well has external rotation.  Strength is 5/5.  Neurovascularly intact.   ASSESSMENT/PLAN:   Arm pain, left Patient continues to have left upper extremity pain.  X-rays negative for fracture.  She is continue to take ibuprofen and naproxen without much improvement.  She has had 1 episode of physical therapy.  Ultrasound today showing  fluid collection at proximal end of left triceps concerning for possible triceps tear.  No full rupture.  Still has full strength.  Recommend continued physical therapy, continue supportive care including naproxen/ibuprofen for pain, will prescribe Flexeril for night time.  Follow-up in 3 to 5 weeks.  Patient may require MRI of the left upper extremity but will hold off at this time.  Return precautions given.     Gifford Shave, MD [place]

## 2022-04-11 NOTE — Assessment & Plan Note (Signed)
Patient continues to have left upper extremity pain.  X-rays negative for fracture.  She is continue to take ibuprofen and naproxen without much improvement.  She has had 1 episode of physical therapy.  Ultrasound today showing fluid collection at proximal end of left triceps concerning for possible triceps tear.  No full rupture.  Still has full strength.  Recommend continued physical therapy, continue supportive care including naproxen/ibuprofen for pain, will prescribe Flexeril for night time.  Follow-up in 3 to 5 weeks.  Patient may require MRI of the left upper extremity but will hold off at this time.  Return precautions given.

## 2022-04-11 NOTE — Progress Notes (Signed)
[facility] Attending Note: I have seen and examined this patient. I have discussed this patient with the resident and reviewed the assessment and plan as documented above. I agree with the resident's findings and plan.  Left upper arm pain.  Date of injury January 26, 2022.  Patient fell against the bathtub/shower landing most of her weight on the left upper arm.  She has had pain since then.  Last office visit she was set up with physical therapy and given a subacromial bursa injection which did not seem to help.  She is continue to have pain significantly every day.  Pain is worse at night when she is trying to sleep.  She has been able to do the exercises at physical therapy but it seems to make things worse at least for the day after PT.  No new symptoms.  Her strength is intact.  She has some tenderness to palpation right at the proximal portion of the long head of the triceps on the left and this does reproduce her pain but she has normal function of triceps.  ULTRASOUND left upper extremity: Limited: Humeral head is seen well located within the glenoid.  There is no effusion within the shoulder.  Biceps tendon is seen in the bicipital groove and is intact without any defect noted.  There is a large echogenicity consistent with fluid collection between theProximal long head of the triceps muscle and the overlying deltoid muscle.  The tricep muscle appears to be intact at its origin.  The deltoid muscle appears to be intact impression: Fluid collection between the triceps and the deltoid muscle consistent with muscle tear with no muscle defect found.  Assessment: Likely traumatic tear of proximal portion long head of the triceps.  Remarkably she does not have any strength loss.  I see nothing on ultrasound that would be consistent with the large muscle tear.  I suspect that she is healing and we will continue physical therapy.  We had a long discussion about ways to improve her time to full  recovery and decided to try the nitroglycerin patch.  I will see her back in 3 to 4 weeks.

## 2022-04-14 ENCOUNTER — Ambulatory Visit: Payer: 59 | Admitting: Physical Therapy

## 2022-04-15 ENCOUNTER — Other Ambulatory Visit (HOSPITAL_COMMUNITY): Payer: Self-pay

## 2022-04-15 MED ORDER — METFORMIN HCL 1000 MG PO TABS
1000.0000 mg | ORAL_TABLET | Freq: Two times a day (BID) | ORAL | 1 refills | Status: DC
  Filled 2022-04-15: qty 180, 90d supply, fill #0
  Filled 2022-07-15: qty 180, 90d supply, fill #1

## 2022-04-17 ENCOUNTER — Ambulatory Visit (INDEPENDENT_AMBULATORY_CARE_PROVIDER_SITE_OTHER): Payer: 59 | Admitting: Podiatry

## 2022-04-17 DIAGNOSIS — E1149 Type 2 diabetes mellitus with other diabetic neurological complication: Secondary | ICD-10-CM | POA: Diagnosis not present

## 2022-04-17 DIAGNOSIS — Q828 Other specified congenital malformations of skin: Secondary | ICD-10-CM | POA: Diagnosis not present

## 2022-04-19 NOTE — Progress Notes (Signed)
Subjective: 51 year old female presents the office today for concerns of painful calluses on both of her feet.  She states they are starting to cause discomfort with walking.  She tries to moisturize and trim them herself at home.  No swelling or redness or any drainage.  No open lesions.   Last A1c was 7.4 on January 14, 2022.  Objective: AAO x3, NAD DP/PT pulses palpable bilaterally, CRT less than 3 seconds Significant bunions are present bilateral with hammertoes deformities bilaterally.  Flatfoot is present.  Hyperkeratotic lesion sulcus of the right first interspace, left submetatarsal 2 as well as fifth metatarsal head on the left foot.  There is no underlying ulceration drainage or any signs of infection.  There is no ulcerations. Mild diffuse discomfort of the ankle with mild swelling.  Most of her discomfort with ambulation.  Previous triple arthrodesis noted. No pain with calf compression, swelling, warmth, erythema  Assessment: Right ankle arthritis, flatfoot; hyperkeratotic lesions  Plan: -All treatment options discussed with the patient including all alternatives, risks, complications.  -Sharply debrided hyperkeratotic lesions x3 without any complications or bleeding -Continue with bracing, supportive shoe gear for the right ankle. -Her blood sugar is much better controlled. -Patient encouraged to call the office with any questions, concerns, change in symptoms.   [name] DPM

## 2022-04-21 ENCOUNTER — Ambulatory Visit: Payer: 59 | Admitting: Physical Therapy

## 2022-04-21 ENCOUNTER — Encounter: Payer: Self-pay | Admitting: Physical Therapy

## 2022-04-21 DIAGNOSIS — M25512 Pain in left shoulder: Secondary | ICD-10-CM | POA: Diagnosis not present

## 2022-04-21 DIAGNOSIS — G8929 Other chronic pain: Secondary | ICD-10-CM

## 2022-04-21 DIAGNOSIS — M6281 Muscle weakness (generalized): Secondary | ICD-10-CM

## 2022-04-21 DIAGNOSIS — M25612 Stiffness of left shoulder, not elsewhere classified: Secondary | ICD-10-CM

## 2022-04-21 NOTE — Therapy (Signed)
OUTPATIENT PHYSICAL THERAPY TREATMENT NOTE   Patient Name: Tina [name] MRN: [id number] DOB:03/31/1971, 51 y.o., female Today's Date: 04/21/2022  PCP: Jonah Blue, MD  REFERRING PROVIDER: Dr. Dorcas Mcmurray  END OF SESSION:   PT End of Session - 04/21/22 1332     Visit Number 2    Number of Visits 13    Date for PT Re-Evaluation 05/30/22    Authorization Type [employer] DUAL COMPLETE; MEDICAID [place] ACCESS    Progress Note Due on Visit 10    PT Start Time 1330    PT Stop Time 3244    PT Time Calculation (min) 45 min    Activity Tolerance Patient tolerated treatment well    Behavior During Therapy WFL for tasks assessed/performed             Past Medical History:  Diagnosis Date   Diabetes mellitus    type II   Fibroids    HTN (hypertension)    Hyperlipidemia    Iron deficiency anemia    Left acetabular fracture ([place])    Lumbar strain    Obesity    Ovarian cyst    Past Surgical History:  Procedure Laterality Date   FOOT ARTHRODESIS, MODIFIED MCBRIDE     right foot bunion surgery and ankle surgery 1980s   ORIF ACETABULAR FRACTURE     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   Patient Active Problem List   Diagnosis Date Noted   Trochanteric bursitis of left hip 01/21/2022   Strain of left triceps muscle 01/21/2022   At risk for obstructive sleep apnea 01/08/2022   Snoring 01/08/2022   Insomnia due to mental condition 01/08/2022   Other chronic pain 01/08/2022   Grief reaction 01/08/2022   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   Insomnia due to other mental disorder 03/18/2021   Generalized anxiety disorder 11/12/2020   Severe recurrent major depression with psychotic features ([place]) 10/04/2020   Bipolar 2 disorder, major depressive episode ([place]) 10/04/2020   Fibroids 04/14/2019   Seasonal allergies 02/06/2017   Osteoarthritis of right ankle 08/29/2016   Tobacco abuse 01/21/2016   Severe obesity (BMI 35.0-39.9) 10/12/2015   Ovarian  cyst 12/24/2012   Hypertension 07/20/2012   Healthcare maintenance 06/27/2011   Type 2 diabetes mellitus ([place]) 05/30/2010   Dyslipidemia 05/30/2010   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    REFERRING DIAG: L arm pain   THERAPY DIAG:  Chronic left shoulder pain  Muscle weakness (generalized)  Stiffness of left shoulder, not elsewhere classified  Rationale for Evaluation and Treatment Rehabilitation  PERTINENT HISTORY: Left upper arm pain.  Date of injury January 26, 2022.  Patient fell against the bathtub/shower landing most of her weight on the left upper arm.  She has had pain since then.  PRECAUTIONS: none   SUBJECTIVE: I still can't slide my arm up the wall.  Discussed the small tear in her L triceps.  I also have elbow bursitis in L elbow.     PAIN:  Are you having pain? Yes: NPRS scale: 6/10 Pain location: L arm  Pain description: sharp and achy Aggravating factors: reaching back and overhead  Relieving factors: Flexeril   OBJECTIVE:    DIAGNOSTIC FINDINGS:   04/11/22:  ULTRASOUND left upper extremity: Limited: Humeral head is seen well located within the glenoid.  There is no effusion within the shoulder.  Biceps tendon is seen in the bicipital groove and is intact without any defect noted.  There is a large echogenicity  consistent with fluid collection between theProximal long head of the triceps muscle and the overlying deltoid muscle.  The tricep muscle appears to be intact at its origin.  The deltoid muscle appears to be intact impression: Fluid collection between the triceps and the deltoid muscle consistent with muscle tear with no muscle defect found.   Assessment: Likely traumatic tear of proximal portion long head of the triceps.  Remarkably she does not have any strength loss.  I see nothing on ultrasound that would be consistent with the large muscle tear.   02/14/22 FINDINGS: Minimal inferior glenoid degenerative osteophytosis.  The glenohumeral joint space is maintained. No significant degenerative change of the acromioclavicular joint. No acute fracture is seen within the left shoulder or more distal aspect of the left humerus.   IMPRESSION: No acute fracture.   PATIENT SURVEYS:  FOTO 44% predicted 58%   COGNITION:           Overall cognitive status: Within functional limits for tasks assessed                                  SENSATION: WFL   POSTURE: Forward head and rounded shoulders   UPPER EXTREMITY ROM:    Active ROM Right eval Left eval  Shoulder flexion 150 72 AAROM wall  slide= 110  Shoulder extension      Shoulder abduction 139 53  Shoulder adduction      Shoulder internal rotation   To L ear  Shoulder external rotation   To L latral hip  Elbow flexion      Elbow extension      Wrist flexion      Wrist extension      Wrist ulnar deviation      Wrist radial deviation      Wrist pronation      Wrist supination      -All active movements were limited by pain -PROMs were greater than AROMs c pain near end range of motions except for ER which was painful at mid range  (Blank rows = not tested)   UPPER EXTREMITY MMT:   MMT Right eval Left eval L  04/21/22  Shoulder flexion   2   Shoulder extension   2   Shoulder abduction   2   Shoulder adduction       Shoulder internal rotation   2   Shoulder external rotation   2   Middle trapezius       Lower trapezius       Elbow flexion     5  Elbow extension     3+  Wrist flexion       Wrist extension       Wrist ulnar deviation       Wrist radial deviation       Wrist pronation       Wrist supination       Grip strength (lbs)       Resisted L shoulder movements were completed in a neutral shoulder position. IR was painful, while all other movements did not provoke pain   (Blank rows = not tested)   SHOULDER SPECIAL TESTS:            Impingement tests: Hawkins/Kennedy impingement test: positive             SLAP lesions: Crank  test: negativean tests were painful , but not overtly weak  Rotator cuff assessment: Empty and full             Biceps assessment: Yergason's test: positive    PALPATION:  TTP mid lateral L arm and lateral border of the L scapula Sweeling noted of the L elbow             TODAY'S TREATMENT:    [facility] Adult PT Treatment:                                                DATE: 04/21/22 Therapeutic Exercise: UBE level 1: 5 min forward  Supine AAROM chest press x 15  Supine AAROM overhead x 10  ER AAROM with dowel x 10 ER LUE table sidefacing  Table slides bilateral UE flexion and scaption x 10 each  Standing extension, row x  20 yellow TB  IR and ER yellow TB x 15  Red band for L external rotation  Unattached-given red for home Pulleys overhead 2 min  Manual Therapy: PROM L UE all planes to tolerance  Modalities: Ice pack L arm 8 min    On EVAL: Shoulder External Rotation 10 reps - 3 hold YTB - Shoulder extension 10 reps - 3 hold YTB - Standing Shoulder Row with Anchored Resistance 10 reps - 3 hold YTB - Shoulder Internal Rotation with Resistance 10 reps - 3 hold YTB - Shoulder Flexion Wall Slide with Towel  10 reps - 3 hold YTB - Seated Shoulder Flexion Towel Slide at Table Top Full Range of Motion  5 reps - 3 hold - Standing Shoulder External Rotation Stretch in Doorway 3 reps - 20 hold     PATIENT EDUCATION: Education details: Eval, POC, HEP, ice ack for pain management Person educated: Patient Education method: Explanation, Demonstration, Tactile cues, Verbal cues, and Handouts Education comprehension: verbalized understanding, returned demonstration, verbal cues required, tactile cues required, and needs further education     HOME EXERCISE PROGRAM: Access Code: [email or web address] URL: [email or web address] Date: 04/10/2022 Prepared by: Gar Ponto   Exercises - Shoulder External Rotation with Anchored Resistance  - 1 x daily - 7 x weekly - 3 sets - 10  reps - 3 hold - Shoulder extension with resistance - Neutral  - 1 x daily - 7 x weekly - 3 sets - 10 reps - 3 hold - Standing Shoulder Row with Anchored Resistance  - 1 x daily - 7 x weekly - 3 sets - 10 reps - 3 hold - Shoulder Internal Rotation with Resistance  - 1 x daily - 7 x weekly - 3 sets - 10 reps - 3 hold - Shoulder Flexion Wall Slide with Towel  - 1 x daily - 7 x weekly - 1 sets - 10 reps - 3 hold - Seated Shoulder Flexion Towel Slide at Table Top Full Range of Motion  - 1 x daily - 7 x weekly - 1 sets - 10 reps - 3 hold - Standing Shoulder External Rotation Stretch in Doorway  - 2 x daily - 7 x weekly - 1 sets - 3 reps - 20 hold   ASSESSMENT:   CLINICAL IMPRESSION: Patient able to tolerate A/AROM in L UE with moderate pain overall.  PT for L arm may benefit her L elbow as well.  Wall slides are on hold and she will do the table slides instead.  Cont POC.  OBJECTIVE IMPAIRMENTS decreased ROM, decreased strength, increased edema, increased muscle spasms, impaired UE functional use, obesity, and pain.    ACTIVITY LIMITATIONS carrying, lifting, bathing, toileting, dressing, reach over head, and hygiene/grooming   PARTICIPATION LIMITATIONS: meal prep, cleaning, laundry, driving, and shopping   PERSONAL FACTORS Time since onset of injury/illness/exacerbation and 1-2 comorbidities: M2 and obesity  are also affecting patient's functional outcome.    REHAB POTENTIAL: Good   CLINICAL DECISION MAKING: Stable/uncomplicated   EVALUATION COMPLEXITY: Low     GOALS:   SHORT TERM GOALS: Target date: 05/02/2022   Pt will be Ind in an initial HEP Baseline:started on eval Goal status: INITIAL   LONG TERM GOALS: Target date: 12/31/21   Increase pt's AROM of the the L shoulder to 90% of the R for improved L UE function with dressing and coking Baseline: see flow sheets Goal status: INITIAL   2.  Increase pt's L shoulder strength to at least 4+ for improved function of the L  shoulder Baseline: see flow sheets Goal status: INITIAL   3.  Pt will report a decrease in L shoulder pain c daily activities to 2/10 at less for improved L shoulder function and QOL  Baseline: Goal status: INITIAL   4.  Pt' FOTO score will increase to the predicted value of 58% as indication of improved function Baseline: 44% Goal status: INITIAL   5.  Pt will be Ind  in a final HEP to maintain achieved LOF and QOL Baseline:  Goal status: INITIAL   PLAN: PT FREQUENCY: 2x/week   PT DURATION: 6 weeks   PLANNED INTERVENTIONS: Therapeutic exercises, Therapeutic activity, Patient/Family education, Joint mobilization, Aquatic Therapy, Dry Needling, Electrical stimulation, Cryotherapy, Moist heat, Taping, Ultrasound, Ionotophoresis '4mg'$ /ml Dexamethasone, Manual therapy, and Re-evaluation   PLAN FOR NEXT SESSION: Assess response to HEP; review FOTO; pogress therex as indicated; use of modalities, manual therapy, TPDN as indicated.  UBE    Raeford Razor, PT 04/21/22 2:14 PM Phone: [phone] Fax: [phone]

## 2022-04-22 ENCOUNTER — Telehealth (HOSPITAL_COMMUNITY): Payer: 59 | Admitting: Psychiatry

## 2022-04-23 ENCOUNTER — Ambulatory Visit: Payer: 59

## 2022-04-25 ENCOUNTER — Telehealth: Payer: Self-pay

## 2022-04-27 ENCOUNTER — Other Ambulatory Visit: Payer: Self-pay | Admitting: Internal Medicine

## 2022-04-28 ENCOUNTER — Other Ambulatory Visit (HOSPITAL_COMMUNITY): Payer: Self-pay

## 2022-04-29 ENCOUNTER — Other Ambulatory Visit: Payer: Self-pay | Admitting: Internal Medicine

## 2022-04-29 ENCOUNTER — Other Ambulatory Visit (HOSPITAL_COMMUNITY): Payer: Self-pay

## 2022-04-29 ENCOUNTER — Telehealth: Payer: Self-pay | Admitting: Internal Medicine

## 2022-04-29 ENCOUNTER — Ambulatory Visit: Payer: 59

## 2022-04-29 DIAGNOSIS — M25512 Pain in left shoulder: Secondary | ICD-10-CM | POA: Diagnosis not present

## 2022-04-29 DIAGNOSIS — M6281 Muscle weakness (generalized): Secondary | ICD-10-CM

## 2022-04-29 DIAGNOSIS — G8929 Other chronic pain: Secondary | ICD-10-CM

## 2022-04-29 DIAGNOSIS — M25612 Stiffness of left shoulder, not elsewhere classified: Secondary | ICD-10-CM

## 2022-04-29 MED ORDER — OZEMPIC (1 MG/DOSE) 4 MG/3ML ~~LOC~~ SOPN
1.0000 mg | PEN_INJECTOR | SUBCUTANEOUS | 2 refills | Status: DC
  Filled 2022-04-29: qty 3, 28d supply, fill #0
  Filled 2022-05-16 – 2022-05-20 (×2): qty 3, 28d supply, fill #1
  Filled 2022-06-17: qty 3, 28d supply, fill #2

## 2022-04-30 ENCOUNTER — Ambulatory Visit (INDEPENDENT_AMBULATORY_CARE_PROVIDER_SITE_OTHER): Payer: 59 | Admitting: Family Medicine

## 2022-04-30 ENCOUNTER — Other Ambulatory Visit (HOSPITAL_COMMUNITY): Payer: Self-pay

## 2022-04-30 VITALS — BP 134/65 | Ht 76.0 in | Wt 282.0 lb

## 2022-04-30 DIAGNOSIS — S46312D Strain of muscle, fascia and tendon of triceps, left arm, subsequent encounter: Secondary | ICD-10-CM | POA: Diagnosis not present

## 2022-04-30 NOTE — Therapy (Signed)
OUTPATIENT PHYSICAL THERAPY TREATMENT NOTE   Patient Name: Tina [name] MRN: [id number] DOB:25-Feb-1971, 51 y.o., female Today's Date: 05/02/2022  PCP: Jonah Blue, MD  REFERRING PROVIDER: Dr. Dorcas Mcmurray  END OF SESSION:   PT End of Session - 05/01/22 0851     Visit Number 4    Number of Visits 13    Date for PT Re-Evaluation 05/30/22    Authorization Type [employer] DUAL COMPLETE; MEDICAID  ACCESS    Progress Note Due on Visit 10    PT Start Time 0850    PT Stop Time 0940    PT Time Calculation (min) 50 min    Equipment Utilized During Treatment Other (comment)   R arm compression sleeve   Activity Tolerance Patient tolerated treatment well    Behavior During Therapy WFL for tasks assessed/performed               Past Medical History:  Diagnosis Date   Diabetes mellitus    type II   Fibroids    HTN (hypertension)    Hyperlipidemia    Iron deficiency anemia    Left acetabular fracture ([place])    Lumbar strain    Obesity    Ovarian cyst    Past Surgical History:  Procedure Laterality Date   FOOT ARTHRODESIS, MODIFIED MCBRIDE     right foot bunion surgery and ankle surgery 1980s   ORIF ACETABULAR FRACTURE     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   Patient Active Problem List   Diagnosis Date Noted   Trochanteric bursitis of left hip 01/21/2022   Strain of left triceps muscle 01/21/2022   At risk for obstructive sleep apnea 01/08/2022   Snoring 01/08/2022   Insomnia due to mental condition 01/08/2022   Other chronic pain 01/08/2022   Grief reaction 01/08/2022   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   Insomnia due to other mental disorder 03/18/2021   Generalized anxiety disorder 11/12/2020   Severe recurrent major depression with psychotic features ([place]) 10/04/2020   Bipolar 2 disorder, major depressive episode ([place]) 10/04/2020   Fibroids 04/14/2019   Seasonal allergies 02/06/2017   Osteoarthritis of right ankle  08/29/2016   Tobacco abuse 01/21/2016   Severe obesity (BMI 35.0-39.9) 10/12/2015   Ovarian cyst 12/24/2012   Hypertension 07/20/2012   Healthcare maintenance 06/27/2011   Type 2 diabetes mellitus ([place]) 05/30/2010   Dyslipidemia 05/30/2010   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    REFERRING DIAG: L arm pain   THERAPY DIAG:  Chronic left shoulder pain  Muscle weakness (generalized)  Stiffness of left shoulder, not elsewhere classified  Rationale for Evaluation and Treatment Rehabilitation  PERTINENT HISTORY: Left upper arm pain.  Date of injury January 26, 2022.  Patient fell against the bathtub/shower landing most of her weight on the left upper arm.  She has had pain since then.  PRECAUTIONS: none   SUBJECTIVE: Pt reports she is having more R lateral upper arm pain  this AM. Pt is not sure of the reason why.  PAIN:  Are you having pain? Yes: NPRS scale: 7/10 Pain location: L arm  Pain description: sharp and achy Aggravating factors: reaching back and overhead  Relieving factors: Flexeril   OBJECTIVE:    DIAGNOSTIC FINDINGS:   04/11/22:  ULTRASOUND left upper extremity: Limited: Humeral head is seen well located within the glenoid.  There is no effusion within the shoulder.  Biceps tendon is seen in the bicipital groove and is intact without any  defect noted.  There is a large echogenicity consistent with fluid collection between theProximal long head of the triceps muscle and the overlying deltoid muscle.  The tricep muscle appears to be intact at its origin.  The deltoid muscle appears to be intact impression: Fluid collection between the triceps and the deltoid muscle consistent with muscle tear with no muscle defect found.   Assessment: Likely traumatic tear of proximal portion long head of the triceps.  Remarkably she does not have any strength loss.  I see nothing on ultrasound that would be consistent with the large muscle tear.    02/14/22 FINDINGS: Minimal inferior glenoid degenerative osteophytosis. The glenohumeral joint space is maintained. No significant degenerative change of the acromioclavicular joint. No acute fracture is seen within the left shoulder or more distal aspect of the left humerus.   IMPRESSION: No acute fracture.   PATIENT SURVEYS:  FOTO 44% predicted 58%   COGNITION:           Overall cognitive status: Within functional limits for tasks assessed                                  SENSATION: WFL   POSTURE: Forward head and rounded shoulders   UPPER EXTREMITY ROM:    Active ROM Right eval Left eval  Shoulder flexion 150 72 AAROM wall  slide= 110  Shoulder extension      Shoulder abduction 139 53  Shoulder adduction      Shoulder internal rotation   To L ear  Shoulder external rotation   To L latral hip  Elbow flexion      Elbow extension      Wrist flexion      Wrist extension      Wrist ulnar deviation      Wrist radial deviation      Wrist pronation      Wrist supination      -All active movements were limited by pain -PROMs were greater than AROMs c pain near end range of motions except for ER which was painful at mid range  (Blank rows = not tested)   UPPER EXTREMITY MMT:   MMT Right eval Left eval L  04/21/22  Shoulder flexion   2   Shoulder extension   2   Shoulder abduction   2   Shoulder adduction       Shoulder internal rotation   2   Shoulder external rotation   2   Middle trapezius       Lower trapezius       Elbow flexion     5  Elbow extension     3+  Wrist flexion       Wrist extension       Wrist ulnar deviation       Wrist radial deviation       Wrist pronation       Wrist supination       Grip strength (lbs)       Resisted L shoulder movements were completed in a neutral shoulder position. IR was painful, while all other movements did not provoke pain   (Blank rows = not tested)   SHOULDER SPECIAL TESTS:            Impingement  tests: Hawkins/Kennedy impingement test: positive             SLAP lesions: Crank test: negativean tests were  painful , but not overtly weak            Rotator cuff assessment: Empty and full             Biceps assessment: Yergason's test: positive    PALPATION:  TTP mid lateral L arm and lateral border of the L scapula Sweeling noted of the L elbow             TODAY'S TREATMENT:  [facility] Adult PT Treatment:                                                DATE: 05/01/22 Therapeutic Exercise: UBE level 1 2 min forward/backward Shoulder row 3x10 RTB Shoulder ext 3x10 RTB Shoulder flexion hand slides on counter top 2x10 Bicep curls 3x10 RTB Tricep press 3x10 RTB IR and ER RTB 3x10  L UE supination 3x10 RTB Manual Therapy: STM to the R bicep and tricep. Pt then completed massage using a tennis to the same areas Modalities: Cold pack x10 mins L upper arm   [facility] Adult PT Treatment:                                                DATE: 04/29/22 Therapeutic Exercise: UBE level 1 4 min forward  Shoulder row 2x10 YTB Shoulder ext 2x10 YTB L UE supination 2x10 YTB Shoulder flex hand slides on counter top 2x10 IR and ER YTB 2x10  Manual Therapy: STM to the ant R GH area and bicep   [facility] Adult PT Treatment:                                                DATE: 04/21/22 Therapeutic Exercise: UBE level 1: 5 min forward  Supine AAROM chest press x 15  Supine AAROM overhead x 10  ER AAROM with dowel x 10 ER LUE table sidefacing  Table slides bilateral UE flexion and scaption x 10 each  Standing extension, row x  20 yellow TB  IR and ER yellow TB x 15  Red band for L external rotation  Unattached-given red for home Pulleys overhead 2 min  Manual Therapy: PROM L UE all planes to tolerance  Modalities: Ice pack L arm 8 min    On EVAL: Shoulder External Rotation 10 reps - 3 hold YTB - Shoulder extension 10 reps - 3 hold YTB - Standing Shoulder Row with Anchored Resistance 10 reps - 3  hold YTB - Shoulder Internal Rotation with Resistance 10 reps - 3 hold YTB - Shoulder Flexion Wall Slide with Towel  10 reps - 3 hold YTB - Seated Shoulder Flexion Towel Slide at Table Top Full Range of Motion  5 reps - 3 hold - Standing Shoulder External Rotation Stretch in Doorway 3 reps - 20 hold     PATIENT EDUCATION: Education details: Eval, POC, HEP, ice ack for pain management Person educated: Patient Education method: Explanation, Demonstration, Tactile cues, Verbal cues, and Handouts Education comprehension: verbalized understanding, returned demonstration, verbal cues required, tactile cues required, and needs further education     HOME EXERCISE PROGRAM:  Access Code: [email or web address] URL: [email or web address] Date: 04/10/2022 Prepared by: Gar Ponto   Exercises - Shoulder External Rotation with Anchored Resistance  - 1 x daily - 7 x weekly - 3 sets - 10 reps - 3 hold - Shoulder extension with resistance - Neutral  - 1 x daily - 7 x weekly - 3 sets - 10 reps - 3 hold - Standing Shoulder Row with Anchored Resistance  - 1 x daily - 7 x weekly - 3 sets - 10 reps - 3 hold - Shoulder Internal Rotation with Resistance  - 1 x daily - 7 x weekly - 3 sets - 10 reps - 3 hold - Shoulder Flexion Wall Slide with Towel  - 1 x daily - 7 x weekly - 1 sets - 10 reps - 3 hold - Seated Shoulder Flexion Towel Slide at Table Top Full Range of Motion  - 1 x daily - 7 x weekly - 1 sets - 10 reps - 3 hold - Standing Shoulder External Rotation Stretch in Doorway  - 2 x daily - 7 x weekly - 1 sets - 3 reps - 20 hold   ASSESSMENT:   CLINICAL IMPRESSION: PT was completed for STM to the L upper arm; biceps, triceps and deltoid. Pt was aslo instructed in and complted STM and also used a tennis ball to assist with tissue mobilization. Strengthening therex were then completed for the shoulder, elbow and forearm. Pt's upper pain was most significantly provoked by resisted shoulder ER. Pt experinced  an increase in pain after the therex and a cold pack was applied for symptom management. PT will continue to address L arm strength and ROM to improve function with less pain.   OBJECTIVE IMPAIRMENTS decreased ROM, decreased strength, increased edema, increased muscle spasms, impaired UE functional use, obesity, and pain.    ACTIVITY LIMITATIONS carrying, lifting, bathing, toileting, dressing, reach over head, and hygiene/grooming   PARTICIPATION LIMITATIONS: meal prep, cleaning, laundry, driving, and shopping   PERSONAL FACTORS Time since onset of injury/illness/exacerbation and 1-2 comorbidities: M2 and obesity  are also affecting patient's functional outcome.    REHAB POTENTIAL: Good   CLINICAL DECISION MAKING: Stable/uncomplicated   EVALUATION COMPLEXITY: Low     GOALS:   SHORT TERM GOALS: Target date: 05/02/2022   Pt will be Ind in an initial HEP Baseline:started on eval Goal status: INITIAL   LONG TERM GOALS: Target date: 12/31/21   Increase pt's AROM of the the L shoulder to 90% of the R for improved L UE function with dressing and coking Baseline: see flow sheets Goal status: INITIAL   2.  Increase pt's L shoulder strength to at least 4+ for improved function of the L shoulder Baseline: see flow sheets Goal status: INITIAL   3.  Pt will report a decrease in L shoulder pain c daily activities to 2/10 at less for improved L shoulder function and QOL  Baseline: Goal status: INITIAL   4.  Pt' FOTO score will increase to the predicted value of 58% as indication of improved function Baseline: 44% Goal status: INITIAL   5.  Pt will be Ind  in a final HEP to maintain achieved LOF and QOL Baseline:  Goal status: INITIAL   PLAN: PT FREQUENCY: 2x/week   PT DURATION: 6 weeks   PLANNED INTERVENTIONS: Therapeutic exercises, Therapeutic activity, Patient/Family education, Joint mobilization, Aquatic Therapy, Dry Needling, Electrical stimulation, Cryotherapy, Moist heat,  Taping, Ultrasound, Ionotophoresis '4mg'$ /ml Dexamethasone, Manual therapy, and Re-evaluation   PLAN FOR  NEXT SESSION: Assess response to HEP; review FOTO; pogress therex as indicated; use of modalities, manual therapy, TPDN as indicated.  [place] MS, PT 05/02/22 7:55 AM

## 2022-04-30 NOTE — Progress Notes (Unsigned)
   Tina [name] is a 51 y.o. female who presents to [facility] today for the following:  Patient presents for follow-up of traumatic tear of proximal portion of long head of the triceps. Has been working with physical therapy weekly and tolerating sessions well, continues to improve with ROM and strength. Denies numbness or tingling or loss of sensation in the arm. Still has some pain with tricep extension and arm raising, but feels this has improved since her fall. NSAIDs and tylenol has been working well as needed for pain and discomfort and muscle relaxer is helping with pain at night and sleep.   PMH reviewed.  ROS as above. Medications reviewed.  Exam:  BP 134/65   Ht '6\' 4"'$  (1.93 m)   Wt 282 lb (127.9 kg)   BMI 34.33 kg/m  Gen: Well NAD MSK: Left shoulder No significant TTP of the AC joint or bicipital groove.  No TTP of the acromion.  Mild TTP in the proximal insertion of the long head of the triceps but improved from prior.  No effusion, erythema, skin changes, ecchymosis, or soft tissue swelling appreciated.  Limited ROM but reports improvement from before.  Active abduction to 110 degrees.  Still has some pain with external rotation and empty can testing that localizes to the proximal tricep.  Strength 5/5 throughout.  Neurovascularly intact distally, peripheral pulses present.    Assessment and Plan: 1) traumatic tear of proximal portion of long head of the triceps-follow-up Has been making good improvement.  No indication for repeat US at this time given short interval between visits.  Could consider repeat US at next visit to assess tear. - Continue working with physical therapy as scheduled, progressing well - Continue home exercises and stretches - Can continue NSAIDs/Tylenol as needed for pain/discomfort - Continue Flexeril nightly to help with spasms, will refill today - Continue to avoid heavy lifting or overhead lifting - Can progress exercises as tolerated -  Follow-up in 8 weeks or sooner if needed   Clyde Lundborg, MD FM PGY 2  Sports Medicine Fellow Addendum   I have independently interviewed and examined the patient. I have discussed the above with the original author and agree with their documentation. My edits for correction/addition/clarification have been made.    [place] Constable, D.O. PGY-4, [facility] 01/08/2022 3:07 PM   Addendum:  I was the preceptor for this visit and available for immediate consultation.  Karlton Lemon MD Kirt Boys

## 2022-05-01 ENCOUNTER — Other Ambulatory Visit (HOSPITAL_COMMUNITY): Payer: Self-pay

## 2022-05-01 ENCOUNTER — Ambulatory Visit: Payer: 59

## 2022-05-01 DIAGNOSIS — M25512 Pain in left shoulder: Secondary | ICD-10-CM | POA: Diagnosis not present

## 2022-05-01 DIAGNOSIS — M25612 Stiffness of left shoulder, not elsewhere classified: Secondary | ICD-10-CM

## 2022-05-01 DIAGNOSIS — G8929 Other chronic pain: Secondary | ICD-10-CM

## 2022-05-01 DIAGNOSIS — M6281 Muscle weakness (generalized): Secondary | ICD-10-CM

## 2022-05-02 ENCOUNTER — Other Ambulatory Visit (HOSPITAL_COMMUNITY): Payer: Self-pay

## 2022-05-02 ENCOUNTER — Encounter (HOSPITAL_COMMUNITY): Payer: Self-pay | Admitting: Psychiatry

## 2022-05-02 ENCOUNTER — Telehealth (INDEPENDENT_AMBULATORY_CARE_PROVIDER_SITE_OTHER): Payer: 59 | Admitting: Psychiatry

## 2022-05-02 DIAGNOSIS — F99 Mental disorder, not otherwise specified: Secondary | ICD-10-CM

## 2022-05-02 DIAGNOSIS — F411 Generalized anxiety disorder: Secondary | ICD-10-CM | POA: Diagnosis not present

## 2022-05-02 DIAGNOSIS — F5105 Insomnia due to other mental disorder: Secondary | ICD-10-CM | POA: Diagnosis not present

## 2022-05-02 DIAGNOSIS — F17200 Nicotine dependence, unspecified, uncomplicated: Secondary | ICD-10-CM | POA: Diagnosis not present

## 2022-05-02 DIAGNOSIS — F3181 Bipolar II disorder: Secondary | ICD-10-CM

## 2022-05-02 MED ORDER — BUSPIRONE HCL 15 MG PO TABS
15.0000 mg | ORAL_TABLET | Freq: Three times a day (TID) | ORAL | 3 refills | Status: DC
  Filled 2022-05-02: qty 90, 30d supply, fill #0
  Filled 2022-06-11: qty 90, 30d supply, fill #1
  Filled 2022-07-15: qty 90, 30d supply, fill #2

## 2022-05-02 MED ORDER — ARIPIPRAZOLE 30 MG PO TABS
30.0000 mg | ORAL_TABLET | Freq: Every day | ORAL | 3 refills | Status: DC
  Filled 2022-05-02: qty 30, 30d supply, fill #0
  Filled 2022-06-11: qty 30, 30d supply, fill #1
  Filled 2022-07-15: qty 30, 30d supply, fill #2

## 2022-05-02 MED ORDER — GABAPENTIN 400 MG PO CAPS
400.0000 mg | ORAL_CAPSULE | Freq: Three times a day (TID) | ORAL | 3 refills | Status: DC
  Filled 2022-05-02: qty 90, 30d supply, fill #0
  Filled 2022-06-17: qty 90, 30d supply, fill #1
  Filled 2022-07-15: qty 90, 30d supply, fill #2

## 2022-05-02 MED ORDER — ZOLPIDEM TARTRATE 5 MG PO TABS
5.0000 mg | ORAL_TABLET | Freq: Every evening | ORAL | 3 refills | Status: DC | PRN
  Filled 2022-05-02 – 2022-06-11 (×2): qty 30, 30d supply, fill #0

## 2022-05-02 MED ORDER — LAMOTRIGINE 100 MG PO TABS
100.0000 mg | ORAL_TABLET | Freq: Every day | ORAL | 2 refills | Status: DC
  Filled 2022-05-02: qty 30, 30d supply, fill #0
  Filled 2022-06-11: qty 30, 30d supply, fill #1
  Filled 2022-07-15: qty 30, 30d supply, fill #2

## 2022-05-02 MED ORDER — SERTRALINE HCL 50 MG PO TABS
50.0000 mg | ORAL_TABLET | Freq: Every day | ORAL | 3 refills | Status: DC
  Filled 2022-05-02: qty 90, 90d supply, fill #0

## 2022-05-02 MED ORDER — NICOTINE POLACRILEX 4 MG MT GUM
4.0000 mg | CHEWING_GUM | OROMUCOSAL | 3 refills | Status: DC | PRN
  Filled 2022-05-02: qty 110, 11d supply, fill #0

## 2022-05-02 NOTE — Progress Notes (Signed)
[facility] MD/PA/NP OP Progress Note Virtual Visit via Telephone Note  I connected with Tina [name] on 05/02/22 at  8:30 AM EDT by telephone and verified that I am speaking with the correct person using two identifiers.  Location: Patient: home Provider: Clinic   I discussed the limitations, risks, security and privacy concerns of performing an evaluation and management service by telephone and the availability of in person appointments. I also discussed with the patient that there may be a patient responsible charge related to this service. The patient expressed understanding and agreed to proceed.   I provided 30 minutes of non-face-to-face time during this encounter.   05/02/2022 8:49 AM Tina [name]  MRN:  [id number]  Chief Complaint: "My arm is really hurting"  HPI: 51 year old female seen today for follow up psychiatric evaluation.   She has a psychiatric history of anxiety, depression, insomnia, tobacco use, and bipolar 2 disorder.  She is currently managed on Lamictal 100 mg daily,  Abilify 20 mg daily,Ambien 5 mg, and BuSpar 15 mg 3 times daily.  Patient also takes gabapentin 300 mg 3 times daily which she received from her PCP.   She notes that her medications are somewhat effective in managing her psychiatric conditions.     Today patient unable to log on virtually so the call was done over the phone. During assessment she was pleasant, cooperative, and engaged in conversation.  She informed provider she visited [place] and fell in the tub and injured her rotator cuff.  She notes that her arm is in severe pain.  She does that she did receive Flexeril from her doctor but notes that its not effective.  Patient informed writer that her arms wakes her up in the middle the night.  She notes that she sleeps 1 to 2 hours nightly due to her pain which she quantifies as an 8 out of 10.  Patient informed Probation officer that her current physical health is exacerbating her mental  health. Provider conducted a GAD-7 and patient scored a 16.  Provider also conducted a PHQ-9 and patient scored a 16.  She endorses passive SI however denies wanting to harm herself today.  Today she denies SI/HI, mania, or paranoia.  Patient informed Probation officer that her voices continue to be bothersome.  She notes that they tell her to harm herself or that she is worthless.  Patient notes that at this time she does not want to switch antipsychotics but would like to maximize which she has.  Abilify increased from 20 mg to 30 mg to help depression and symptoms of psychosis.  Provider increase gabapentin to 400 mg 3 times daily to help manage anxiety and pain.  She will continue her other medications as prescribed.   Provider discussed starting  Lybalvi, Haldol, Zyprexa, Seroquel, or Vraylar if psychosis does not improve.  Patient reports that she has tried Risperdal in the past without success.  She informed Probation officer that she would like to be on antipsychotic that does not cause weight gain. No other concerns at this time.      Visit Diagnosis:    ICD-10-CM   1. Bipolar 2 disorder, major depressive episode (HCC)  F31.81 ARIPiprazole (ABILIFY) 30 MG tablet    gabapentin (NEURONTIN) 400 MG capsule    lamoTRIgine (LAMICTAL) 100 MG tablet    2. Generalized anxiety disorder  F41.1 busPIRone (BUSPAR) 15 MG tablet    gabapentin (NEURONTIN) 400 MG capsule    DISCONTINUED: sertraline (ZOLOFT) 50 MG tablet  3. Insomnia due to other mental disorder  F51.05 zolpidem (AMBIEN) 5 MG tablet   F99     4. 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    Family Psychiatric History: Niece Schizophrenia  Family History:  Family History  Problem Relation Age of Onset   Colon cancer Sister    Diabetes Mother    Diabetes Father    Diabetes Brother    Celiac disease Paternal Aunt     Social History:  Social History   Socioeconomic History   Marital status: Single    Spouse name: Not on file   Number of children: 0   Years of education: 12   Highest education level: Some college, no degree  Occupational History   Occupation: Engineer, materials: UNEMPLOYED    Employer: [employer], [employer]  Tobacco Use   Smoking status: Every Day    Packs/day: 0.50    Years: 23.00    Total pack years: 11.50    Types: Cigarettes   Smokeless tobacco: Never   Tobacco comments:    1/2PPD  Vaping Use   Vaping Use: Never used  Substance and Sexual Activity   Alcohol use: Yes    Alcohol/week: 0.0 standard drinks of alcohol    Comment: occ   Drug use: No   Sexual activity: Yes    Partners: Female    Birth control/protection: Injection    Comment: Depo  Other Topics Concern   Not on file  Social History Narrative   Homosexual; in a monogamous relationship w/ homosexual woman.   She is disabled.  She last worked in 2018 at [employer].    Right handed   One story home   Caffeine: 1 soda daily, occas drinks coffee    Social Determinants of Health   Financial Resource Strain: High Risk (10/25/2020)   Overall Financial Resource Strain (CARDIA)    Difficulty of Paying Living Expenses: Hard  Food Insecurity: No Food Insecurity (10/25/2020)   Hunger Vital Sign    Worried About Running Out of Food in the Last Year: Never true    Ran Out of Food in the Last Year: Never true  Transportation Needs: No Transportation Needs  (10/25/2020)   PRAPARE - Hydrologist (Medical): No    Lack of Transportation (Non-Medical): No  Physical Activity: Inactive (08/03/2020)   Exercise Vital Sign    Days of Exercise per Week: 0 days    Minutes of Exercise per Session: 0 min  Stress: Stress Concern Present (10/11/2020)   [place]    Feeling of Stress : Very much  Social Connections: Moderately Isolated (08/03/2020)   Social Connection and Isolation Panel [NHANES]    Frequency of Communication  with Friends and Family: More than three times a week    Frequency of Social Gatherings with Friends and Family: More than three times a week    Attends Religious Services: More than 4 times per year    Active Member of [employer] or Organizations: No    Attends Archivist Meetings: Never    Marital Status: Never married    Allergies:  Allergies  Allergen Reactions   Codeine Other (See Comments)    hallucinations    Metabolic Disorder Labs: Lab Results  Component Value Date   HGBA1C 7.4 (A) 01/14/2022   MPG 200 05/25/2017   MPG 237 02/26/2016   No results found for: "PROLACTIN" Lab Results  Component Value Date   CHOL 164 03/11/2022   TRIG 195 (H) 03/11/2022   HDL 33 (L) 03/11/2022   CHOLHDL 5.0 (H) 03/11/2022   VLDL 50 (H) 08/21/2014   LDLCALC 97 03/11/2022   LDLCALC 94 08/08/2020   Lab Results  Component Value Date   TSH 1.074 06/27/2011    Therapeutic Level Labs: No results found for: "LITHIUM" No results found for: "VALPROATE" No results found for: "CBMZ"  Current Medications: Current Outpatient Medications  Medication Sig Dispense Refill   amLODipine-olmesartan (AZOR) 5-20 MG tablet Take 1 tablet by mouth daily. 90 tablet 1   ARIPiprazole (ABILIFY) 30 MG tablet Take 1 tablet (30 mg total) by mouth daily. 30 tablet 3   atorvastatin (LIPITOR) 40 MG tablet Take 1 tablet (40 mg total) by mouth daily. 100  tablet 2   Blood Glucose Monitoring Suppl (CONTOUR NEXT ONE) KIT 1 each by Does not apply route See admin instructions. USE TO CHECK BLOOD GLUCOSE ONCE DAILY. IM PROGRAM. 1 kit 0   busPIRone (BUSPAR) 15 MG tablet Take 1 tablet (15 mg total) by mouth 3 (three) times daily. 90 tablet 3   Continuous Blood Gluc Sensor (FREESTYLE LIBRE 2 SENSOR) MISC Check blood sugar at least 6 times a day 6 each 3   cyclobenzaprine (FLEXERIL) 5 MG tablet Take 1 tablet (5 mg total) by mouth at bedtime as needed for muscle spasms. 30 tablet 1   empagliflozin (JARDIANCE) 10 MG TABS tablet Take 1 tablet (10 mg total) by mouth daily before breakfast. 90 tablet 1   gabapentin (NEURONTIN) 400 MG capsule Take 1 capsule (400 mg total) by mouth 3 (three) times daily. 90 capsule 3   glucose blood (CONTOUR NEXT TEST) test strip Please use to check your sugar 3 times a day. 100 strip 3   Insulin Lispro Prot & Lispro (HUMALOG MIX 75/25 KWIKPEN) (75-25) 100 UNIT/ML Kwikpen Inject 24 Units into the skin in the morning and at bedtime. 15 mL 11   Insulin Pen Needle 32G X 4 MM MISC Use daily at 6pm 100 each 4   lamoTRIgine (LAMICTAL) 100 MG tablet Take 1 tablet (100 mg total) by mouth daily. 30 tablet 2   Lancets MISC 1 Units by Does not apply route 3 (three) times daily. 100 each 3   meloxicam (MOBIC) 15 MG tablet Take 1 tablet (15 mg total) by mouth daily. 30 tablet 0   metFORMIN (GLUCOPHAGE) 1000 MG tablet Take 1 tablet (1,000 mg total) by mouth 2 (two) times daily with a meal. 180 tablet 1   Multiple Vitamins-Minerals (MULTIVITAMIN WITH MINERALS) tablet Take 1 tablet by mouth daily.     naproxen (NAPROSYN) 500 MG tablet Take 1 tablet (500 mg total) by mouth 2 (two) times daily with a meal. 60 tablet  0   nicotine polacrilex (NICORETTE) 4 MG gum Use 1 piece of gum (4 mg total) by mouth as needed for smoking cessation. 110 tablet 3   nitroGLYCERIN (NITRO-DUR) 0.2 mg/hr patch Cut patch into one - fourth pieces. Place a one-fourth (1/4)  piece of patch on skin over affected area, changing to a new piece every 24 hours. 30 patch 1   Semaglutide, 1 MG/DOSE, (OZEMPIC, 1 MG/DOSE,) 4 MG/3ML SOPN Inject 1 mg into the skin once a week. 3 mL 2   traMADol (ULTRAM) 50 MG tablet Take 1 tablet (50 mg total) by mouth every 12 (twelve) hours as needed. 20 tablet 0   triamcinolone cream (KENALOG) 0.1 % Apply 1 application topically 2 (two) times daily. 30 g 0   zolpidem (AMBIEN) 5 MG tablet Take 1 tablet (5 mg total) by mouth at bedtime as needed for sleep. 30 tablet 3   No current facility-administered medications for this visit.     Musculoskeletal: Strength & Muscle Tone:  Unable to assess due to telephone visit Gait & Station:  Unable to assess due to telephone visit Patient leans: N/A  Psychiatric Specialty Exam: Review of Systems  There were no vitals taken for this visit.There is no height or weight on file to calculate BMI.  General Appearance:  Unable to assess due to telephone visit  Eye Contact:   Unable to assess due to telephone visit  Speech:  Clear and Coherent and Normal Rate  Volume:  Normal  Mood:  Anxious and Depressed  Affect:  Appropriate and Congruent  Thought Process:  Coherent, Goal Directed and Linear  Orientation:  Full (Time, Place, and Person)  Thought Content: Logical and Hallucinations: Auditory   Suicidal Thoughts:  Yes.  without intent/plan  Homicidal Thoughts:  No  Memory:  Immediate;   Good Recent;   Good Remote;   Good  Judgement:  Good  Insight:  Good  Psychomotor Activity:  Normal  Concentration:  Concentration: Good and Attention Span: Good  Recall:  Good  Fund of Knowledge: Good  Language: Good  Akathisia:  No  Handed:  Right  AIMS (if indicated):Not done  Assets:  Communication Skills Desire for Improvement Financial Resources/Insurance Housing Social Support  ADL's:  Intact  Cognition: WNL  Sleep:  Poor   Screenings: GAD-7    Flowsheet Row Video Visit from 05/02/2022 in  [facility] Video Visit from 12/27/2021 in [facility] Video Visit from 10/11/2021 in [facility] Counselor from 09/11/2021 in [facility] Counselor from 08/27/2021 in [facility]  Total GAD-7 Score _0 [id number]    Flowsheet Row Video Visit from 05/02/2022 in [facility] Office Visit from 02/13/2022 in [place] Office Visit from 01/21/2022 in [place] Video Visit from 12/27/2021 in [facility] Office Visit from 10/16/2021 in [place]  PHQ-2 Total Score 4 0 _1 PHQ-9 Total Score 16 0 _2 Flowsheet Row Video Visit from 12/27/2021 in [facility] Video Visit from 10/11/2021 in [facility] Counselor from 09/11/2021 in [place] Error: Q7 should not be populated when Q6 is No Error: Question 6 not populated Low Risk  Assessment and Plan: Patient endorses symptoms of anxiety, depression, psychosis, and insomnia.  Patient notes that at this time she does not want to switch antipsychotics but would like to maximize which she has.  Abilify increased from 20 mg to 30 mg to help depression and symptoms of psychosis.  Provider increase gabapentin to 400 mg 3 times daily to help manage anxiety and pain.  She will continue her other medications as prescribed.   Provider discussed starting  Lybalvi, Haldol, Zyprexa, Seroquel, or Vraylar if psychosis does not improve.  1. Bipolar 2 disorder, major depressive episode (HCC)  Increased- ARIPiprazole (ABILIFY) 30 MG tablet; Take 1 tablet (30 mg total) by mouth daily.  Dispense: 30 tablet; Refill: 3 Increase/PCP will continue prescribing- gabapentin  (NEURONTIN) 400 MG capsule; Take 1 capsule (400 mg total) by mouth 3 (three) times daily.  Dispense: 90 capsule; Refill: 3 Continue- lamoTRIgine (LAMICTAL) 100 MG tablet; Take 1 tablet (100 mg total) by mouth daily.  Dispense: 30 tablet; Refill: 2  2. Generalized anxiety disorder  Continue- busPIRone (BUSPAR) 15 MG tablet; Take 1 tablet (15 mg total) by mouth 3 (three) times daily.  Dispense: 90 tablet; Refill: 3 Increase/PCP will continue prescribing- gabapentin (NEURONTIN) 400 MG capsule; Take 1 capsule (400 mg total) by mouth 3 (three) times daily.  Dispense: 90 capsule; Refill: 3  3. Insomnia due to other mental disorder  Continue- zolpidem (AMBIEN) 5 MG tablet; Take 1 tablet (5 mg total) by mouth at bedtime as needed for sleep.  Dispense: 30 tablet; Refill: 3  4. Tobacco use disorder  Continue- nicotine polacrilex (NICORETTE) 4 MG gum; Use 1 piece of gum (4 mg total) by mouth as needed for smoking cessation.  Dispense: 110 tablet; Refill: 3     Follow up in 3 month  Follow up with therapy Salley Slaughter, NP 05/02/2022, 8:49 AM

## 2022-05-05 ENCOUNTER — Other Ambulatory Visit (HOSPITAL_COMMUNITY): Payer: Self-pay

## 2022-05-05 ENCOUNTER — Ambulatory Visit (HOSPITAL_COMMUNITY): Payer: 59 | Admitting: Clinical

## 2022-05-05 ENCOUNTER — Encounter (HOSPITAL_COMMUNITY): Payer: Self-pay

## 2022-05-05 ENCOUNTER — Telehealth (HOSPITAL_COMMUNITY): Payer: Self-pay | Admitting: Clinical

## 2022-05-05 NOTE — Telephone Encounter (Signed)
Therapist attempted twice to call the client via tele-phone for the scheduled virtual therapy appt. Client did not answer. Therapist left a voicemail for the client to return call to the office to reschedule

## 2022-05-07 ENCOUNTER — Ambulatory Visit: Payer: 59 | Attending: Family Medicine

## 2022-05-07 DIAGNOSIS — G8929 Other chronic pain: Secondary | ICD-10-CM | POA: Insufficient documentation

## 2022-05-07 DIAGNOSIS — M6281 Muscle weakness (generalized): Secondary | ICD-10-CM | POA: Insufficient documentation

## 2022-05-07 DIAGNOSIS — M25612 Stiffness of left shoulder, not elsewhere classified: Secondary | ICD-10-CM | POA: Insufficient documentation

## 2022-05-07 DIAGNOSIS — M25512 Pain in left shoulder: Secondary | ICD-10-CM | POA: Insufficient documentation

## 2022-05-07 NOTE — Therapy (Signed)
OUTPATIENT PHYSICAL THERAPY TREATMENT NOTE   Patient Name: Tina [name] MRN: [id number] DOB:[birth year], 51 y.o., female Today's Date: 05/07/2022  PCP: Jonah Blue, MD  REFERRING PROVIDER: Dr. Dorcas Mcmurray  END OF SESSION:   PT End of Session - 05/07/22 0847     Visit Number 5    Number of Visits 13    Date for PT Re-Evaluation 05/30/22    Authorization Type [employer] DUAL COMPLETE; MEDICAID [place] ACCESS    Progress Note Due on Visit 10    PT Start Time 0846    PT Stop Time 0936    PT Time Calculation (min) 50 min    Equipment Utilized During Treatment --    Activity Tolerance Patient tolerated treatment well    Behavior During Therapy WFL for tasks assessed/performed                Past Medical History:  Diagnosis Date   Diabetes mellitus    type II   Fibroids    HTN (hypertension)    Hyperlipidemia    Iron deficiency anemia    Left acetabular fracture ([place])    Lumbar strain    Obesity    Ovarian cyst    Past Surgical History:  Procedure Laterality Date   FOOT ARTHRODESIS, MODIFIED MCBRIDE     right foot bunion surgery and ankle surgery 1980s   ORIF ACETABULAR FRACTURE     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   Patient Active Problem List   Diagnosis Date Noted   Trochanteric bursitis of left hip 01/21/2022   Strain of left triceps muscle 01/21/2022   At risk for obstructive sleep apnea 01/08/2022   Snoring 01/08/2022   Insomnia due to mental condition 01/08/2022   Other chronic pain 01/08/2022   Grief reaction 01/08/2022   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   Insomnia due to other mental disorder 03/18/2021   Generalized anxiety disorder 11/12/2020   Severe recurrent major depression with psychotic features ([place]) 10/04/2020   Bipolar 2 disorder, major depressive episode ([place]) 10/04/2020   Fibroids 04/14/2019   Seasonal allergies 02/06/2017   Osteoarthritis of right ankle 08/29/2016   Tobacco abuse 01/21/2016    Severe obesity (BMI 35.0-39.9) 10/12/2015   Ovarian cyst 12/24/2012   Hypertension 07/20/2012   Healthcare maintenance 06/27/2011   Type 2 diabetes mellitus ([place]) 05/30/2010   Dyslipidemia 05/30/2010   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    REFERRING DIAG: L arm pain   THERAPY DIAG:  Chronic left shoulder pain  Stiffness of left shoulder, not elsewhere classified  Muscle weakness (generalized)  Rationale for Evaluation and Treatment Rehabilitation  PERTINENT HISTORY: Left upper arm pain.  Date of injury January 26, 2022.  Patient fell against the bathtub/shower landing most of her weight on the left upper arm.  She has had pain since then.  PRECAUTIONS: none   SUBJECTIVE: Pt reports having L upper pain last night, but not sure why she was having the increase. It hurts worse at night nadin the morning. During the mid-day the pain is better.  PAIN:  Are you having pain? Yes: NPRS scale: 8/10 Pain location: L arm  Pain description: sharp and achy Aggravating factors: reaching back and overhead  Relieving factors: Flexeril   OBJECTIVE:    DIAGNOSTIC FINDINGS:   04/11/22:  ULTRASOUND left upper extremity: Limited: Humeral head is seen well located within the glenoid.  There is no effusion within the shoulder.  Biceps tendon is seen in the bicipital groove  and is intact without any defect noted.  There is a large echogenicity consistent with fluid collection between theProximal long head of the triceps muscle and the overlying deltoid muscle.  The tricep muscle appears to be intact at its origin.  The deltoid muscle appears to be intact impression: Fluid collection between the triceps and the deltoid muscle consistent with muscle tear with no muscle defect found.   Assessment: Likely traumatic tear of proximal portion long head of the triceps.  Remarkably she does not have any strength loss.  I see nothing on ultrasound that would be consistent with the large muscle  tear.   02/14/22 FINDINGS: Minimal inferior glenoid degenerative osteophytosis. The glenohumeral joint space is maintained. No significant degenerative change of the acromioclavicular joint. No acute fracture is seen within the left shoulder or more distal aspect of the left humerus.   IMPRESSION: No acute fracture.   PATIENT SURVEYS:  FOTO 44% predicted 58%   COGNITION:           Overall cognitive status: Within functional limits for tasks assessed                                  SENSATION: WFL   POSTURE: Forward head and rounded shoulders   UPPER EXTREMITY ROM:    Active ROM Right eval Left eval  Shoulder flexion 150 72 AAROM wall  slide= 110  Shoulder extension      Shoulder abduction 139 53  Shoulder adduction      Shoulder internal rotation   To L ear  Shoulder external rotation   To L latral hip  Elbow flexion      Elbow extension      Wrist flexion      Wrist extension      Wrist ulnar deviation      Wrist radial deviation      Wrist pronation      Wrist supination      -All active movements were limited by pain -PROMs were greater than AROMs c pain near end range of motions except for ER which was painful at mid range  (Blank rows = not tested)   UPPER EXTREMITY MMT:   MMT Right eval Left eval L  04/21/22  Shoulder flexion   2   Shoulder extension   2   Shoulder abduction   2   Shoulder adduction       Shoulder internal rotation   2   Shoulder external rotation   2   Middle trapezius       Lower trapezius       Elbow flexion     5  Elbow extension     3+  Wrist flexion       Wrist extension       Wrist ulnar deviation       Wrist radial deviation       Wrist pronation       Wrist supination       Grip strength (lbs)       Resisted L shoulder movements were completed in a neutral shoulder position. IR was painful, while all other movements did not provoke pain   (Blank rows = not tested)   SHOULDER SPECIAL TESTS:            Impingement  tests: Hawkins/Kennedy impingement test: positive             SLAP lesions:  Crank test: negativean tests were painful , but not overtly weak            Rotator cuff assessment: Empty and full             Biceps assessment: Yergason's test: positive    PALPATION:  TTP mid lateral L arm and lateral border of the L scapula Sweeling noted of the L elbow             TODAY'S TREATMENT:  [facility] Adult PT Treatment:                                                DATE: 05/07/22 Therapeutic Exercise: UBE level 1 2 min forward/backward Shoulder row 3x10 RTB Shoulder ext 3x10 RTB Shoulder flexion hand slides on counter top 2x10 Bicep curls 3x10 RTB Tricep press 3x10 RTB IR and ER RTB 3x10  L UE supination 3x10 RTB Manual Therapy: STM and DTM to the R bicep and tricep. General tenderness without specific areas of pain or tightness Modalities: Vaso L shoulder 15 mins, low pressure, 38dF  [facility] Adult PT Treatment:                                                DATE: 05/01/22 Therapeutic Exercise: UBE level 1 2 min forward/backward Shoulder row 3x10 RTB Shoulder ext 3x10 RTB Shoulder flexion hand slides on counter top 2x10 Bicep curls 3x10 RTB Tricep press 3x10 RTB IR and ER RTB 3x10  L UE supination 3x10 RTB Manual Therapy: STM to the R bicep and tricep. Pt then completed massage using a tennis to the same areas Modalities: Cold pack x10 mins L upper arm   [facility] Adult PT Treatment:                                                DATE: 04/29/22 Therapeutic Exercise: UBE level 1 4 min forward  Shoulder row 2x10 YTB Shoulder ext 2x10 YTB L UE supination 2x10 YTB Shoulder flex hand slides on counter top 2x10 IR and ER YTB 2x10  Manual Therapy: STM to the ant R GH area and bicep     PATIENT EDUCATION: Education details: Eval, POC, HEP, ice ack for pain management Person educated: Patient Education method: Explanation, Demonstration, Tactile cues, Verbal cues, and Handouts Education  comprehension: verbalized understanding, returned demonstration, verbal cues required, tactile cues required, and needs further education     HOME EXERCISE PROGRAM: Access Code: [email or web address] URL: [email or web address] Date: 04/10/2022 Prepared by: Gar Ponto   Exercises - Shoulder External Rotation with Anchored Resistance  - 1 x daily - 7 x weekly - 3 sets - 10 reps - 3 hold - Shoulder extension with resistance - Neutral  - 1 x daily - 7 x weekly - 3 sets - 10 reps - 3 hold - Standing Shoulder Row with Anchored Resistance  - 1 x daily - 7 x weekly - 3 sets - 10 reps - 3 hold - Shoulder Internal Rotation with Resistance  - 1 x daily - 7 x weekly - 3 sets - 10 reps - 3  hold - Shoulder Flexion Wall Slide with Towel  - 1 x daily - 7 x weekly - 1 sets - 10 reps - 3 hold - Seated Shoulder Flexion Towel Slide at Table Top Full Range of Motion  - 1 x daily - 7 x weekly - 1 sets - 10 reps - 3 hold - Standing Shoulder External Rotation Stretch in Doorway  - 2 x daily - 7 x weekly - 1 sets - 3 reps - 20 hold   ASSESSMENT:   CLINICAL IMPRESSION: PT was completed for therex for L shoulder strengthening of the rotator cuff and periscapular muscular. Pt reported the most discomfort with the supination strengthening exs. Prior to therex, STM/DTM was completed with general soreness more so than a specific area of pain than tightness. Pt demonstrated proper completion of her therex and reports consistent completion of her HEP. Pt tolerated PT today with a min increase in pain. Vaso was applied afterward for symptom control.   OBJECTIVE IMPAIRMENTS decreased ROM, decreased strength, increased edema, increased muscle spasms, impaired UE functional use, obesity, and pain.    ACTIVITY LIMITATIONS carrying, lifting, bathing, toileting, dressing, reach over head, and hygiene/grooming   PARTICIPATION LIMITATIONS: meal prep, cleaning, laundry, driving, and shopping   PERSONAL FACTORS Time since onset  of injury/illness/exacerbation and 1-2 comorbidities: M2 and obesity  are also affecting patient's functional outcome.    REHAB POTENTIAL: Good   CLINICAL DECISION MAKING: Stable/uncomplicated   EVALUATION COMPLEXITY: Low     GOALS:   SHORT TERM GOALS: Target date: 05/02/2022   Pt will be Ind in an initial HEP Baseline:started on eval Goal status: MET   LONG TERM GOALS: Target date: 12/31/21   Increase pt's AROM of the the L shoulder to 90% of the R for improved L UE function with dressing and coking Baseline: see flow sheets Goal status: INITIAL   2.  Increase pt's L shoulder strength to at least 4+ for improved function of the L shoulder Baseline: see flow sheets Goal status: INITIAL   3.  Pt will report a decrease in L shoulder pain c daily activities to 2/10 at less for improved L shoulder function and QOL  Baseline: Goal status: INITIAL   4.  Pt' FOTO score will increase to the predicted value of 58% as indication of improved function Baseline: 44% Goal status: INITIAL   5.  Pt will be Ind  in a final HEP to maintain achieved LOF and QOL Baseline:  Goal status: INITIAL   PLAN: PT FREQUENCY: 2x/week   PT DURATION: 6 weeks   PLANNED INTERVENTIONS: Therapeutic exercises, Therapeutic activity, Patient/Family education, Joint mobilization, Aquatic Therapy, Dry Needling, Electrical stimulation, Cryotherapy, Moist heat, Taping, Ultrasound, Ionotophoresis 54m/ml Dexamethasone, Manual therapy, and Re-evaluation   PLAN FOR NEXT SESSION: Assess response to HEP; review FOTO; pogress therex as indicated; use of modalities, manual therapy, TPDN as indicated.  UBE. Assess goals and FOTO.   Lindley Hiney MS, PT 05/07/22 9:57 AM

## 2022-05-09 ENCOUNTER — Ambulatory Visit: Payer: 59 | Admitting: Physical Therapy

## 2022-05-09 ENCOUNTER — Other Ambulatory Visit (HOSPITAL_COMMUNITY): Payer: Self-pay

## 2022-05-09 ENCOUNTER — Encounter: Payer: Self-pay | Admitting: Physical Therapy

## 2022-05-09 DIAGNOSIS — M25612 Stiffness of left shoulder, not elsewhere classified: Secondary | ICD-10-CM

## 2022-05-09 DIAGNOSIS — G8929 Other chronic pain: Secondary | ICD-10-CM

## 2022-05-09 DIAGNOSIS — M6281 Muscle weakness (generalized): Secondary | ICD-10-CM

## 2022-05-09 DIAGNOSIS — M25512 Pain in left shoulder: Secondary | ICD-10-CM | POA: Diagnosis not present

## 2022-05-09 NOTE — Patient Instructions (Signed)

## 2022-05-09 NOTE — Therapy (Addendum)
OUTPATIENT PHYSICAL THERAPY TREATMENT NOTE DISCHARGE   Patient Name: Tina [name] MRN: [id number] DOB:07/13/1971, 51 y.o., female Today's Date: 05/09/2022  PCP: Jonah Blue, MD  REFERRING PROVIDER: Dr. Dorcas Mcmurray  END OF SESSION:   PT End of Session - 05/09/22 0854     Visit Number 6    Number of Visits 13    Date for PT Re-Evaluation 05/30/22    Authorization Type [employer] DUAL COMPLETE; MEDICAID [place] ACCESS    Progress Note Due on Visit 10    PT Start Time 0850    PT Stop Time 0930    PT Time Calculation (min) 40 min    Activity Tolerance Patient tolerated treatment well    Behavior During Therapy WFL for tasks assessed/performed                 Past Medical History:  Diagnosis Date   Diabetes mellitus    type II   Fibroids    HTN (hypertension)    Hyperlipidemia    Iron deficiency anemia    Left acetabular fracture ([place])    Lumbar strain    Obesity    Ovarian cyst    Past Surgical History:  Procedure Laterality Date   FOOT ARTHRODESIS, MODIFIED MCBRIDE     right foot bunion surgery and ankle surgery 1980s   ORIF ACETABULAR FRACTURE     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   Patient Active Problem List   Diagnosis Date Noted   Trochanteric bursitis of left hip 01/21/2022   Strain of left triceps muscle 01/21/2022   At risk for obstructive sleep apnea 01/08/2022   Snoring 01/08/2022   Insomnia due to mental condition 01/08/2022   Other chronic pain 01/08/2022   Grief reaction 01/08/2022   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   Insomnia due to other mental disorder 03/18/2021   Generalized anxiety disorder 11/12/2020   Severe recurrent major depression with psychotic features ([place]) 10/04/2020   Bipolar 2 disorder, major depressive episode ([place]) 10/04/2020   Fibroids 04/14/2019   Seasonal allergies 02/06/2017   Osteoarthritis of right ankle 08/29/2016   Tobacco abuse 01/21/2016   Severe obesity (BMI 35.0-39.9)  10/12/2015   Ovarian cyst 12/24/2012   Hypertension 07/20/2012   Healthcare maintenance 06/27/2011   Type 2 diabetes mellitus ([place]) 05/30/2010   Dyslipidemia 05/30/2010   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    REFERRING DIAG: L arm pain   THERAPY DIAG:  Chronic left shoulder pain  Stiffness of left shoulder, not elsewhere classified  Muscle weakness (generalized)  Rationale for Evaluation and Treatment Rehabilitation  PERTINENT HISTORY: Left upper arm pain.  Date of injury January 26, 2022.  Patient fell against the bathtub/shower landing most of her weight on the left upper arm.  She has had pain since then.  PRECAUTIONS: none   SUBJECTIVE: Pain still 7/10. Elbow and arm, the exercises increase the pain, especially the bands.    PAIN:  Are you having pain? Yes: NPRS scale: 7 /10 Pain location: L arm  Pain description: sharp and achy Aggravating factors: reaching back and overhead  Relieving factors: Flexeril   OBJECTIVE:    DIAGNOSTIC FINDINGS:   04/11/22:  ULTRASOUND left upper extremity: Limited: Humeral head is seen well located within the glenoid.  There is no effusion within the shoulder.  Biceps tendon is seen in the bicipital groove and is intact without any defect noted.  There is a large echogenicity consistent with fluid collection between theProximal long head  of the triceps muscle and the overlying deltoid muscle.  The tricep muscle appears to be intact at its origin.  The deltoid muscle appears to be intact impression: Fluid collection between the triceps and the deltoid muscle consistent with muscle tear with no muscle defect found.   Assessment: Likely traumatic tear of proximal portion long head of the triceps.  Remarkably she does not have any strength loss.  I see nothing on ultrasound that would be consistent with the large muscle tear.   02/14/22 FINDINGS: Minimal inferior glenoid degenerative osteophytosis. The glenohumeral joint space  is maintained. No significant degenerative change of the acromioclavicular joint. No acute fracture is seen within the left shoulder or more distal aspect of the left humerus.   IMPRESSION: No acute fracture.   PATIENT SURVEYS:  FOTO 44% predicted 58%   COGNITION:           Overall cognitive status: Within functional limits for tasks assessed                                  SENSATION: WFL   POSTURE: Forward head and rounded shoulders   UPPER EXTREMITY ROM:    Active ROM Right eval Left eval  Shoulder flexion 150 72 AAROM wall  slide= 110  Shoulder extension      Shoulder abduction 139 53  Shoulder adduction      Shoulder internal rotation   To L ear  Shoulder external rotation   To L latral hip  Elbow flexion      Elbow extension      Wrist flexion      Wrist extension      Wrist ulnar deviation      Wrist radial deviation      Wrist pronation      Wrist supination      -All active movements were limited by pain -PROMs were greater than AROMs c pain near end range of motions except for ER which was painful at mid range  (Blank rows = not tested)   UPPER EXTREMITY MMT:   MMT Right eval Left eval L  04/21/22  Shoulder flexion   2   Shoulder extension   2   Shoulder abduction   2   Shoulder adduction       Shoulder internal rotation   2   Shoulder external rotation   2   Middle trapezius       Lower trapezius       Elbow flexion     5  Elbow extension     3+  Wrist flexion       Wrist extension       Wrist ulnar deviation       Wrist radial deviation       Wrist pronation       Wrist supination       Grip strength (lbs)       Resisted L shoulder movements were completed in a neutral shoulder position. IR was painful, while all other movements did not provoke pain   (Blank rows = not tested)   SHOULDER SPECIAL TESTS:            Impingement tests: Hawkins/Kennedy impingement test: positive             SLAP lesions: Crank test: negativean tests were  painful , but not overtly weak  Rotator cuff assessment: Empty and full             Biceps assessment: Yergason's test: positive    PALPATION:  TTP mid lateral L arm and lateral border of the L scapula Sweeling noted of the L elbow             TODAY'S TREATMENT:    [facility] Adult PT Treatment:                                                DATE: 05/09/22 Therapeutic Exercise: UBE level 1, 3 min each direction  Supine dowel exercises flexion, ER/IR and chest press About 10-15 reps each with cues   Manual Therapy: MMT and PROM L shoulder  Neuromuscular re-ed: NA Therapeutic Activity: NA Modalities: IFC Rt arm ( shoulder to elbow) 15 min to tolerance Cold pack 15 min L elbow and shoulder  Self Care: Home tens unit- handout given    [facility] Adult PT Treatment:                                                DATE: 05/07/22 Therapeutic Exercise: UBE level 1 2 min forward/backward Shoulder row 3x10 RTB Shoulder ext 3x10 RTB Shoulder flexion hand slides on counter top 2x10 Bicep curls 3x10 RTB Tricep press 3x10 RTB IR and ER RTB 3x10  L UE supination 3x10 RTB Manual Therapy: STM and DTM to the R bicep and tricep. General tenderness without specific areas of pain or tightness Modalities: Vaso L shoulder 15 mins, low pressure, 38dF  [facility] Adult PT Treatment:                                                DATE: 05/01/22 Therapeutic Exercise: UBE level 1 2 min forward/backward Shoulder row 3x10 RTB Shoulder ext 3x10 RTB Shoulder flexion hand slides on counter top 2x10 Bicep curls 3x10 RTB Tricep press 3x10 RTB IR and ER RTB 3x10  L UE supination 3x10 RTB Manual Therapy: STM to the R bicep and tricep. Pt then completed massage using a tennis to the same areas Modalities: Cold pack x10 mins L upper arm   [facility] Adult PT Treatment:                                                DATE: 04/29/22 Therapeutic Exercise: UBE level 1 4 min forward  Shoulder row 2x10 YTB Shoulder  ext 2x10 YTB L UE supination 2x10 YTB Shoulder flex hand slides on counter top 2x10 IR and ER YTB 2x10  Manual Therapy: STM to the ant R GH area and bicep     PATIENT EDUCATION: Education details: Eval, POC, HEP, ice ack for pain management Person educated: Patient Education method: Explanation, Demonstration, Tactile cues, Verbal cues, and Handouts Education comprehension: verbalized understanding, returned demonstration, verbal cues required, tactile cues required, and needs further education     HOME EXERCISE PROGRAM: Access Code: [email or web address] URL: [email or web address] Date: 04/10/2022 Prepared  by: Gar Ponto   Exercises - Shoulder External Rotation with Anchored Resistance  - 1 x daily - 7 x weekly - 3 sets - 10 reps - 3 hold - Shoulder extension with resistance - Neutral  - 1 x daily - 7 x weekly - 3 sets - 10 reps - 3 hold - Standing Shoulder Row with Anchored Resistance  - 1 x daily - 7 x weekly - 3 sets - 10 reps - 3 hold - Shoulder Internal Rotation with Resistance  - 1 x daily - 7 x weekly - 3 sets - 10 reps - 3 hold - Shoulder Flexion Wall Slide with Towel  - 1 x daily - 7 x weekly - 1 sets - 10 reps - 3 hold - Seated Shoulder Flexion Towel Slide at Table Top Full Range of Motion  - 1 x daily - 7 x weekly - 1 sets - 10 reps - 3 hold - Standing Shoulder External Rotation Stretch in Doorway  - 2 x daily - 7 x weekly - 1 sets - 3 reps - 20 hold   ASSESSMENT:   CLINICAL IMPRESSION: Patient with continued mod to severe pain .  She is not sleeping well, pain wakes her at night. She has weakness in rotator cuff, limited shoulder flexion, abduction . Used ice and IFC today for pain control.  Elbow felt warm with swelling today.  Consider imaging to shoulder, seems like more of a [place] issue than triceps. Used IFC for pain control.  She plans to call MD today to see if they have any other suggestions.  Will keep chart open but has no further PT appts until she sees MD>      OBJECTIVE IMPAIRMENTS decreased ROM, decreased strength, increased edema, increased muscle spasms, impaired UE functional use, obesity, and pain.    ACTIVITY LIMITATIONS carrying, lifting, bathing, toileting, dressing, reach over head, and hygiene/grooming   PARTICIPATION LIMITATIONS: meal prep, cleaning, laundry, driving, and shopping   PERSONAL FACTORS Time since onset of injury/illness/exacerbation and 1-2 comorbidities: M2 and obesity  are also affecting patient's functional outcome.    REHAB POTENTIAL: Good   CLINICAL DECISION MAKING: Stable/uncomplicated   EVALUATION COMPLEXITY: Low     GOALS:   SHORT TERM GOALS: Target date: 05/02/2022   Pt will be Ind in an initial HEP Baseline:started on eval Goal status: MET   LONG TERM GOALS: Target date: 12/31/21   Increase pt's AROM of the the L shoulder to 90% of the R for improved L UE function with dressing and coking Baseline: see flow sheets, 100 deg flexion painful in supine  Goal status: ongoing    2.  Increase pt's L shoulder strength to at least 4+ for improved function of the L shoulder Baseline: see flow sheets, 3-/5 Goal status: ongoing   3.  Pt will report a decrease in L shoulder pain c daily activities to 2/10 at less for improved L shoulder function and QOL  Baseline: > 7/10  Goal status: ongoing    4.  Pt' FOTO score will increase to the predicted value of 58% as indication of improved function Baseline: 44% Goal status: NT today, ongoing    5.  Pt will be Ind  in a final HEP to maintain achieved LOF and QOL Baseline:  Goal status: ongoing    PLAN: PT FREQUENCY: 2x/week   PT DURATION: 6 weeks   PLANNED INTERVENTIONS: Therapeutic exercises, Therapeutic activity, Patient/Family education, Joint mobilization, Aquatic Therapy, Dry Needling, Electrical stimulation, Cryotherapy,  Moist heat, Taping, Ultrasound, Ionotophoresis 58m/ml Dexamethasone, Manual therapy, and Re-evaluation   PLAN FOR NEXT  SESSION: DC vs cont? HOLD until sees or speaks with MD/  JRaeford Razor PT 05/09/22 10:16 AM Phone: [phone]: [phone]  PHYSICAL THERAPY DISCHARGE SUMMARY  Visits from Start of Care: 6  Current functional level related to goals / functional outcomes: See above    Remaining deficits: Strength, pain, ROM    Education / Equipment: HEP    Patient agrees to discharge. Patient goals were not met. Patient is being discharged due to did not respond to therapy. MRI scheduled .  Returned to MD for follow up   JRaeford Razor PT 06/04/22 11:00 AM Phone: [phone]: [phone]

## 2022-05-12 ENCOUNTER — Other Ambulatory Visit: Payer: Self-pay | Admitting: Internal Medicine

## 2022-05-12 ENCOUNTER — Other Ambulatory Visit (HOSPITAL_COMMUNITY): Payer: Self-pay

## 2022-05-12 DIAGNOSIS — E1165 Type 2 diabetes mellitus with hyperglycemia: Secondary | ICD-10-CM

## 2022-05-13 ENCOUNTER — Other Ambulatory Visit (HOSPITAL_COMMUNITY): Payer: Self-pay

## 2022-05-14 ENCOUNTER — Other Ambulatory Visit (HOSPITAL_COMMUNITY): Payer: Self-pay

## 2022-05-14 MED ORDER — EMPAGLIFLOZIN 10 MG PO TABS
10.0000 mg | ORAL_TABLET | Freq: Every day | ORAL | 1 refills | Status: DC
  Filled 2022-05-14: qty 90, 90d supply, fill #0
  Filled 2022-06-11: qty 90, 90d supply, fill #1

## 2022-05-16 ENCOUNTER — Other Ambulatory Visit (HOSPITAL_COMMUNITY): Payer: Self-pay

## 2022-05-20 ENCOUNTER — Telehealth: Payer: Self-pay

## 2022-05-20 ENCOUNTER — Other Ambulatory Visit (HOSPITAL_COMMUNITY): Payer: Self-pay

## 2022-05-20 NOTE — Telephone Encounter (Signed)
Pt is requesting a call back ... She is wanting to know when is she  due to her DEPO

## 2022-05-20 NOTE — Telephone Encounter (Signed)
RTC to patient due for next Depo Shot 05/27/2022 thru 06/10/2022.

## 2022-05-27 ENCOUNTER — Ambulatory Visit (INDEPENDENT_AMBULATORY_CARE_PROVIDER_SITE_OTHER): Payer: 59 | Admitting: *Deleted

## 2022-05-27 DIAGNOSIS — D219 Benign neoplasm of connective and other soft tissue, unspecified: Secondary | ICD-10-CM

## 2022-05-27 MED ORDER — MEDROXYPROGESTERONE ACETATE 104 MG/0.65ML ~~LOC~~ SUSY
104.0000 mg | PREFILLED_SYRINGE | Freq: Once | SUBCUTANEOUS | Status: AC
  Administered 2022-05-27: 104 mg via SUBCUTANEOUS

## 2022-06-02 ENCOUNTER — Ambulatory Visit (INDEPENDENT_AMBULATORY_CARE_PROVIDER_SITE_OTHER): Payer: 59 | Admitting: Family Medicine

## 2022-06-02 ENCOUNTER — Encounter: Payer: Self-pay | Admitting: Family Medicine

## 2022-06-02 VITALS — BP 133/77 | Ht 76.0 in | Wt 281.0 lb

## 2022-06-02 DIAGNOSIS — M25512 Pain in left shoulder: Secondary | ICD-10-CM

## 2022-06-02 NOTE — Progress Notes (Addendum)
PCP: [name], MD  Subjective:   HPI: Patient is a 51 y.o. female here for left shoulder and elbow pain.  Ongoing pain from her fall in March. She continues to have 8/10 pain most days. Her pain is mostly located along the front of her upper arm and on the tip of her elbow. She has tried Meloxicam, naproxen, tramadol, ibuprofen, and Tylenol in the past without relief. She had a subacromial steroid injection in her shoulder which did not help. She has been going to physical therapy and doing home exercises for 4-6 weeks without improvement. Motion diminished.  Past Medical History:  Diagnosis Date   Diabetes mellitus    type II   Fibroids    HTN (hypertension)    Hyperlipidemia    Iron deficiency anemia    Left acetabular fracture (HCC)    Lumbar strain    Obesity    Ovarian cyst     Current Outpatient Medications on File Prior to Visit  Medication Sig Dispense Refill   empagliflozin (JARDIANCE) 10 MG TABS tablet Take 1 tablet (10 mg total) by mouth daily before breakfast. 90 tablet 1   amLODipine-olmesartan (AZOR) 5-20 MG tablet Take 1 tablet by mouth daily. 90 tablet 1   ARIPiprazole (ABILIFY) 30 MG tablet Take 1 tablet (30 mg total) by mouth daily. 30 tablet 3   atorvastatin (LIPITOR) 40 MG tablet Take 1 tablet (40 mg total) by mouth daily. 100 tablet 2   Blood Glucose Monitoring Suppl (CONTOUR NEXT ONE) KIT 1 each by Does not apply route See admin instructions. USE TO CHECK BLOOD GLUCOSE ONCE DAILY. IM PROGRAM. 1 kit 0   busPIRone (BUSPAR) 15 MG tablet Take 1 tablet (15 mg total) by mouth 3 (three) times daily. 90 tablet 3   Continuous Blood Gluc Sensor (FREESTYLE LIBRE 2 SENSOR) MISC Check blood sugar at least 6 times a day 6 each 3   cyclobenzaprine (FLEXERIL) 5 MG tablet Take 1 tablet (5 mg total) by mouth at bedtime as needed for muscle spasms. 30 tablet 1   gabapentin (NEURONTIN) 400 MG capsule Take 1 capsule (400 mg total) by mouth 3 (three) times daily. 90 capsule 3    glucose blood (CONTOUR NEXT TEST) test strip Please use to check your sugar 3 times a day. 100 strip 3   Insulin Lispro Prot & Lispro (HUMALOG MIX 75/25 KWIKPEN) (75-25) 100 UNIT/ML Kwikpen Inject 24 Units into the skin in the morning and at bedtime. 15 mL 11   Insulin Pen Needle 32G X 4 MM MISC Use daily at 6pm 100 each 4   lamoTRIgine (LAMICTAL) 100 MG tablet Take 1 tablet (100 mg total) by mouth daily. 30 tablet 2   Lancets MISC 1 Units by Does not apply route 3 (three) times daily. 100 each 3   meloxicam (MOBIC) 15 MG tablet Take 1 tablet (15 mg total) by mouth daily. 30 tablet 0   metFORMIN (GLUCOPHAGE) 1000 MG tablet Take 1 tablet (1,000 mg total) by mouth 2 (two) times daily with a meal. 180 tablet 1   Multiple Vitamins-Minerals (MULTIVITAMIN WITH MINERALS) tablet Take 1 tablet by mouth daily.     naproxen (NAPROSYN) 500 MG tablet Take 1 tablet (500 mg total) by mouth 2 (two) times daily with a meal. 60 tablet 0   nicotine polacrilex (NICORETTE) 4 MG gum Use 1 piece of gum (4 mg total) by mouth as needed for smoking cessation. 110 tablet 3   nitroGLYCERIN (NITRO-DUR) 0.2 mg/hr patch Cut  patch into one - fourth pieces. Place a one-fourth (1/4) piece of patch on skin over affected area, changing to a new piece every 24 hours. 30 patch 1   Semaglutide, 1 MG/DOSE, (OZEMPIC, 1 MG/DOSE,) 4 MG/3ML SOPN Inject 1 mg into the skin once a week. 3 mL 2   traMADol (ULTRAM) 50 MG tablet Take 1 tablet (50 mg total) by mouth every 12 (twelve) hours as needed. 20 tablet 0   triamcinolone cream (KENALOG) 0.1 % Apply 1 application topically 2 (two) times daily. 30 g 0   zolpidem (AMBIEN) 5 MG tablet Take 1 tablet (5 mg total) by mouth at bedtime as needed for sleep. 30 tablet 3   No current facility-administered medications on file prior to visit.    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 No evidence of bony deformity, asymmetry, or muscle atrophy; No tenderness over long head of biceps (bicipital groove). No TTP at [facility]. Decreased ROM compared to left side. 90 degrees of abduction. 90 degrees of flexion. 20 degrees of external rotation (70 degrees on right). Normal internal rotation. Strength 5/5 throughout. Negative hawkins, Neer. Mildly painful empty can. Mildly painful speeds.  Elbow, left: significant swelling over olecranon bursa. No TTP.   Assessment & Plan:  1.  Chronic left shoulder pain likely 2/2 rotator cuff tendinopathy vs adhesive capsulitis. Failed several weeks of physical therapy, nsaids, subacromial injection. Recommend MRI of the left shoulder. Follow up after the MRI.   Virgel Manifold, MS4   Addendum: database reviewed, sent valium 2 tablets in for procedure.

## 2022-06-02 NOTE — Patient Instructions (Signed)
We will go ahead with an MRI of your shoulder given you're not improving with the conservative treatments you've tried to date. I will call you with results and next steps.

## 2022-06-05 ENCOUNTER — Ambulatory Visit (HOSPITAL_COMMUNITY): Payer: 59 | Admitting: Clinical

## 2022-06-08 ENCOUNTER — Ambulatory Visit
Admission: RE | Admit: 2022-06-08 | Discharge: 2022-06-08 | Disposition: A | Discharge status: Home / Self Care | Payer: 59 | Source: Ambulatory Visit | Attending: Family Medicine | Admitting: Family Medicine

## 2022-06-08 DIAGNOSIS — M25512 Pain in left shoulder: Secondary | ICD-10-CM

## 2022-06-09 ENCOUNTER — Other Ambulatory Visit (HOSPITAL_COMMUNITY): Payer: Self-pay

## 2022-06-09 MED ORDER — DIAZEPAM 5 MG PO TABS
ORAL_TABLET | ORAL | 0 refills | Status: DC
  Filled 2022-06-09: qty 2, 2d supply, fill #0

## 2022-06-09 NOTE — Addendum Note (Signed)
Addended by: Dene Gentry on: 06/09/2022 01:35 PM   Modules accepted: Orders

## 2022-06-10 ENCOUNTER — Telehealth: Payer: Self-pay | Admitting: Dietician

## 2022-06-10 NOTE — Telephone Encounter (Signed)
Spoke to patient about Continuous glucose monitoring. She stopped using it because the patch she purchased "ripped her skin off" when she wen to remove it. She agreed to retry Continuous glucose monitoring with patches I offered to mail her. I educated her about easiest way to remove the patch and sensor without damaging her skin.  Encouraged her to have Continuous glucose monitoring information for her upcoming appointment with Dr. Cain Sieve.

## 2022-06-11 ENCOUNTER — Other Ambulatory Visit (HOSPITAL_COMMUNITY): Payer: Self-pay

## 2022-06-17 ENCOUNTER — Other Ambulatory Visit (HOSPITAL_COMMUNITY): Payer: Self-pay

## 2022-06-23 DIAGNOSIS — M67912 Unspecified disorder of synovium and tendon, left shoulder: Secondary | ICD-10-CM | POA: Insufficient documentation

## 2022-06-23 NOTE — Progress Notes (Unsigned)
[facility]: Clinic Note  Subjective:  History of Present Illness: Tina [name] is a 51 y.o. year old female who presents for establishing care with me, she is not new to our clinic. Last visit 03/11/22  Her main concern today is 1 week of increased urinary frequency with cottage-cheese like white discharge. No fevers, chills, nausea, vomiting, or flank pain apart from her chronic back pain. No dysuria. No new sexual partners. Has history of UTIs and of yeast infections, yeast infections only since starting Jardiance.  She's seeing Sports Med for her left shoulder pain, which has not improved with PT or steroid injections. They're concerned about frozen shoulder, and she's scheduled for a redo of the MRI (had anxiety with first attempt, now has a Valium prescribed by sports med to try).  Her 2nd concern is 1 month of left elbow swelling. It is mildly painful. Not currently on NSAIDs. Has not had an injection in elbow before.   For T2DM, she's taking metformin, ozempic, jardiance, & insulin as prescribed. A1C 7.4 today. Tolerating 31m ozempic well, we discussed going up to 253mweekly, which she's interested in.   For HTN, she's taking meds. BP elevated today, but we focused on other issues for now.   She's seeing [facility] her bipolar 2, anxiety, & depression. Feels depressed about chronic nature of shoulder pain. Taking lamictal 100 daily, abilify 30 daily, ambien 5 daily, buspar 15 TID, gaba 400 TID  For HCM, she's open to getting colonoscopy.     Please refer to Assessment and Plan below for full details in Problem-Based Charting.   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   Insomnia due to other mental disorder 03/18/2021   Generalized anxiety disorder 11/12/2020   Severe recurrent major depression with psychotic features ([facility]oor[place]12/12/2019   Bipolar 2 disorder, major depressive episode (HCOologah12/12/2019   Fibroids 04/14/2019   Seasonal allergies 02/06/2017   Osteoarthritis of right ankle 08/29/2016   Tobacco abuse 01/21/2016   Severe obesity with body mass index (BMI) of 35.0 to 39.9 with serious comorbidity (HCLandrum12/07/2015   Ovarian cyst 12/24/2012   Hypertension 07/20/2012   Healthcare maintenance 06/27/2011   Type 2 diabetes mellitus (HCStella07/28/2011   Dyslipidemia 05/30/2010   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, Disp: 100 tablet, Rfl: 2  Blood Glucose Monitoring Suppl (CONTOUR NEXT ONE) KIT, 1 each by Does not apply route See admin instructions. USE TO CHECK BLOOD GLUCOSE ONCE DAILY. IM PROGRAM., Disp: 1 kit, Rfl: 0   busPIRone (BUSPAR) 15 MG tablet, Take 1 tablet (15 mg total) by mouth 3 (three) times daily., Disp: 90 tablet, Rfl: 3   Continuous Blood Gluc Sensor (FREESTYLE LIBRE 2 SENSOR) MISC, Check blood sugar at least 6 times a day, Disp: 6 each,  Rfl: 3   cyclobenzaprine (FLEXERIL) 5 MG tablet, Take 1 tablet (5 mg total) by mouth at bedtime as needed for muscle spasms., Disp: 30 tablet, Rfl: 1   diazepam (VALIUM) 5 MG tablet, Take 1 tablet 30 minutes prior to procedure.  May repeat one time, Disp: 2 tablet, Rfl: 0   gabapentin (NEURONTIN) 400 MG capsule, Take 1 capsule (400 mg total) by mouth 3 (three) times daily., Disp: 90 capsule, Rfl: 3   glucose blood (CONTOUR NEXT TEST) test strip, Please use to check your sugar 3 times a day., Disp: 100 strip, Rfl: 3   Insulin Lispro Prot & Lispro (HUMALOG MIX 75/25 KWIKPEN) (75-25) 100 UNIT/ML Kwikpen, Inject 24 Units into the skin in the morning and at bedtime., Disp: 15 mL, Rfl: 11   Insulin Pen Needle 32G X 4 MM MISC, Use daily at 6pm, Disp: 100 each, Rfl: 4   lamoTRIgine (LAMICTAL) 100 MG tablet, Take 1 tablet (100 mg total) by mouth daily., Disp: 30 tablet, Rfl: 2   Lancets MISC, 1 Units by Does not apply route 3 (three) times daily., Disp: 100 each, Rfl: 3   metFORMIN (GLUCOPHAGE) 1000 MG tablet, Take 1 tablet (1,000 mg total) by mouth 2 (two) times daily with a meal., Disp: 180 tablet, Rfl: 1   Multiple Vitamins-Minerals (MULTIVITAMIN WITH MINERALS) tablet, Take 1 tablet by mouth daily., Disp: , Rfl:    naproxen (NAPROSYN) 500 MG tablet, Take 1 tablet (500 mg total) by mouth 2 (two) times daily with a meal., Disp: 60 tablet, Rfl: 0   nicotine polacrilex (NICORETTE) 4 MG gum, Use 1 piece of gum (4 mg total) by mouth as needed for smoking cessation., Disp: 110 tablet, Rfl: 3   nitroGLYCERIN (NITRO-DUR) 0.2 mg/hr patch, Cut patch into one - fourth pieces. Place a one-fourth (1/4) piece of patch on skin over affected area, changing to a new piece every 24 hours., Disp: 30 patch, Rfl: 1   traMADol (ULTRAM) 50 MG tablet, Take 1 tablet (50 mg total) by mouth every 12 (twelve) hours as needed., Disp: 20 tablet, Rfl: 0   triamcinolone cream (KENALOG) 0.1 %, Apply 1 application topically 2 (two) times  daily., Disp: 30 g, Rfl: 0   zolpidem (AMBIEN) 5 MG tablet, Take 1 tablet (5 mg total) by mouth at bedtime as needed for sleep., Disp: 30 tablet, Rfl: 3   Allergies: Allergies  Allergen Reactions   Codeine Other (See Comments)    hallucinations   Objective:   Vitals: Vitals:   06/25/22 1009  BP: (!) 150/77  Pulse: (!) 106  Temp: 98.8 F (37.1 C)  SpO2: 100%    Physical Exam: Physical Exam Constitutional:      Appearance: Normal appearance. She is obese.  Pulmonary:     Effort: Pulmonary effort is normal. No respiratory distress.  Musculoskeletal:     Comments: Decreased active & passive ROM in L shoulder. 4cm nontender, nonwarm fluctuance in L elbow  Skin:    General: Skin is warm and dry.  Findings: No bruising or erythema.  Neurological:     Mental Status: She is alert.      Data: Labs, imaging, and micro were reviewed in Epic. Refer to Assessment and Plan below for full details in Problem-Based Charting.  Assessment & Plan:  Type 2 diabetes mellitus (HCC) - A1C 7.4 today - Increase Ozempic to 79m once weekly - Continue Jardiance 190monce daily for now, but if she has a yeast infection, we will need to stop this - Continue Metformin 100049mID - Continue Novolog 70/30 24 units BID - She is up to date on eye & foot exams - check MACR at next visit - continue atorvastatin 11m47mily   Healthcare maintenance # Healthcare maintenance in women  - Colorectal cancer: Referred for colonoscopy today - Breast cancer: [facility] due 08/2023 - Cervical cancer: due for pap, address at next visit  - Lung cancer: (50-80yo w/ 20+ pack year and smoked in last 15 years, annual l  Hypertension - continue Azor 5-20. If BP still elevated at next visit, could increase to 10-40 dosing. We did not discuss fully at this visit.  Severe obesity with body mass index (BMI) of 35.0 to 39.9 with serious comorbidity (HCC)PerleyPatient is interested in weight loss. Will increase Ozempic from  1 to 2mg 15mkly for both T2DM & weight loss  Tendinopathy of left rotator cuff - Concern for adhesive capsulitis. Continue to follow with Sports Med, who has ordered an MRI (pending)  Olecranon bursitis of left elbow - Naproxen 500mg 80mx 4 weeks - If not improved, next step would be steroid injection. I'll reach out to Sports Medicine to see if they do this procedure, and if not, will try to schedule with one of my colleagues   Vaginal discharge - POC U/A negative for UTI. I think she likely has a yeast infection, also sent self swab for BV & trich today - Will call patient with results of swab, treat prn, and stop Jardiance if she has a yeast infection     Patient will follow up in 4 weeks  Lizmarie Witters [name]

## 2022-06-25 ENCOUNTER — Ambulatory Visit (INDEPENDENT_AMBULATORY_CARE_PROVIDER_SITE_OTHER): Payer: 59 | Admitting: Internal Medicine

## 2022-06-25 ENCOUNTER — Other Ambulatory Visit (HOSPITAL_COMMUNITY): Payer: Self-pay

## 2022-06-25 ENCOUNTER — Ambulatory Visit: Payer: 59 | Admitting: Family Medicine

## 2022-06-25 ENCOUNTER — Other Ambulatory Visit (HOSPITAL_COMMUNITY)
Admission: RE | Admit: 2022-06-25 | Discharge: 2022-06-25 | Disposition: A | Discharge status: Home / Self Care | Payer: 59 | Source: Ambulatory Visit | Attending: Internal Medicine | Admitting: Internal Medicine

## 2022-06-25 ENCOUNTER — Encounter: Payer: Self-pay | Admitting: Internal Medicine

## 2022-06-25 VITALS — BP 150/77 | HR 106 | Temp 98.8°F | Ht 76.0 in | Wt 278.7 lb

## 2022-06-25 DIAGNOSIS — Z7985 Long-term (current) use of injectable non-insulin antidiabetic drugs: Secondary | ICD-10-CM

## 2022-06-25 DIAGNOSIS — M67912 Unspecified disorder of synovium and tendon, left shoulder: Secondary | ICD-10-CM

## 2022-06-25 DIAGNOSIS — N898 Other specified noninflammatory disorders of vagina: Secondary | ICD-10-CM | POA: Diagnosis present

## 2022-06-25 DIAGNOSIS — E1169 Type 2 diabetes mellitus with other specified complication: Secondary | ICD-10-CM

## 2022-06-25 DIAGNOSIS — Z794 Long term (current) use of insulin: Secondary | ICD-10-CM

## 2022-06-25 DIAGNOSIS — Z1211 Encounter for screening for malignant neoplasm of colon: Secondary | ICD-10-CM

## 2022-06-25 DIAGNOSIS — R300 Dysuria: Secondary | ICD-10-CM

## 2022-06-25 DIAGNOSIS — F17210 Nicotine dependence, cigarettes, uncomplicated: Secondary | ICD-10-CM

## 2022-06-25 DIAGNOSIS — Z7984 Long term (current) use of oral hypoglycemic drugs: Secondary | ICD-10-CM

## 2022-06-25 DIAGNOSIS — E119 Type 2 diabetes mellitus without complications: Secondary | ICD-10-CM

## 2022-06-25 DIAGNOSIS — Z0000 Encounter for general adult medical examination without abnormal findings: Secondary | ICD-10-CM

## 2022-06-25 DIAGNOSIS — I10 Essential (primary) hypertension: Secondary | ICD-10-CM

## 2022-06-25 DIAGNOSIS — M7022 Olecranon bursitis, left elbow: Secondary | ICD-10-CM

## 2022-06-25 DIAGNOSIS — Z6835 Body mass index (BMI) 35.0-35.9, adult: Secondary | ICD-10-CM

## 2022-06-25 LAB — POCT URINALYSIS DIPSTICK
Bilirubin, UA: NEGATIVE
Blood, UA: NEGATIVE
Glucose, UA: POSITIVE — AB
Ketones, UA: NEGATIVE
Leukocytes, UA: NEGATIVE
Nitrite, UA: NEGATIVE
Protein, UA: NEGATIVE
Spec Grav, UA: 1.02 (ref 1.010–1.025)
Urobilinogen, UA: 0.2 E.U./dL
pH, UA: 6 (ref 5.0–8.0)

## 2022-06-25 LAB — POCT GLYCOSYLATED HEMOGLOBIN (HGB A1C): Hemoglobin A1C: 7.4 % — AB (ref 4.0–5.6)

## 2022-06-25 LAB — GLUCOSE, CAPILLARY: Glucose-Capillary: 247 mg/dL — ABNORMAL HIGH (ref 70–99)

## 2022-06-25 MED ORDER — NAPROXEN 500 MG PO TABS
500.0000 mg | ORAL_TABLET | Freq: Two times a day (BID) | ORAL | 0 refills | Status: DC
  Filled 2022-06-25: qty 60, 30d supply, fill #0

## 2022-06-25 MED ORDER — OZEMPIC (2 MG/DOSE) 8 MG/3ML ~~LOC~~ SOPN
2.0000 mg | PEN_INJECTOR | SUBCUTANEOUS | 3 refills | Status: DC
  Filled 2022-06-25: qty 3, 28d supply, fill #0
  Filled 2022-07-15: qty 3, 28d supply, fill #1
  Filled 2022-08-18: qty 3, 28d supply, fill #2
  Filled 2022-09-15: qty 3, 28d supply, fill #3

## 2022-06-25 NOTE — Assessment & Plan Note (Signed)
-   continue Azor 5-20. If BP still elevated at next visit, could increase to 10-40 dosing. We did not discuss fully at this visit.

## 2022-06-25 NOTE — Assessment & Plan Note (Signed)
-   Naproxen '500mg'$  BID x 4 weeks - If not improved, next step would be steroid injection. I'll reach out to Sports Medicine to see if they do this procedure, and if not, will try to schedule with one of my colleagues

## 2022-06-25 NOTE — Assessment & Plan Note (Signed)
#   Healthcare maintenance in women  - Colorectal cancer: Referred for colonoscopy today - Breast cancer: [facility] due 08/2023 - Cervical cancer: due for pap, address at next visit  - Lung cancer: (50-53yo w/ 20+ pack year and smoked in last 15 years, annual l

## 2022-06-25 NOTE — Assessment & Plan Note (Signed)
-   A1C 7.4 today - Increase Ozempic to '2mg'$  once weekly - Continue Jardiance '10mg'$  once daily for now, but if she has a yeast infection, we will need to stop this - Continue Metformin '1000mg'$  BID - Continue Novolog 70/30 24 units BID - She is up to date on eye & foot exams - check MACR at next visit - continue atorvastatin '40mg'$  daily

## 2022-06-25 NOTE — Assessment & Plan Note (Signed)
-   POC U/A negative for UTI. I think she likely has a yeast infection, also sent self swab for BV & trich today - Will call patient with results of swab, treat prn, and stop Jardiance if she has a yeast infection

## 2022-06-25 NOTE — Patient Instructions (Signed)
It was a pleasure seeing you in clinic today.   We have sent out some studies to test for a UTI and yeast infection. I'll call you with those results and send in a medicine if you need it.  For your elbow, you have something called olecranon bursitis. We'll try a 4-week course of anti-inflammatory medicine called Naproxen. Take this twice daily with food. If that doesn't improve your elbow, the next step would be a steroid injection. I'll reach out to your Sports Medicine doctor to see if he or one of my colleagues can do this for you.  For you diabetes, we're increasing your Ozempic to '2mg'$  weekly. I've sent the new pen into your pharmacy. If you have a yeast infection, we will stop your Jardiance. I'll call you to let you know.   I've also placed a referral for a colonoscopy - they'll call you to schedule.   Since we discussed so much today, I'd like to see you back in clinic in 1 month. I may be out on maternity leave then, so you may see one of my colleagues.  Please call if you need anything in the meantime.  Sincerely, Dr. Cain Sieve

## 2022-06-25 NOTE — Assessment & Plan Note (Signed)
-   Concern for adhesive capsulitis. Continue to follow with Sports Med, who has ordered an MRI (pending)

## 2022-06-25 NOTE — Assessment & Plan Note (Signed)
-   Patient is interested in weight loss. Will increase Ozempic from 1 to '2mg'$  weekly for both T2DM & weight loss

## 2022-06-26 ENCOUNTER — Other Ambulatory Visit (HOSPITAL_COMMUNITY): Payer: Self-pay

## 2022-06-26 LAB — URINALYSIS, ROUTINE W REFLEX MICROSCOPIC
Bilirubin, UA: NEGATIVE
Ketones, UA: NEGATIVE
Nitrite, UA: NEGATIVE
Protein,UA: NEGATIVE
RBC, UA: NEGATIVE
Specific Gravity, UA: >=1.03 — AB (ref 1.005–1.030)
Urobilinogen, Ur: 0.2 mg/dL (ref 0.2–1.0)
pH, UA: 5.5 (ref 5.0–7.5)

## 2022-06-26 LAB — MICROSCOPIC EXAMINATION
Bacteria, UA: NONE SEEN
Casts: NONE SEEN /lpf
RBC, Urine: NONE SEEN /hpf (ref 0–2)

## 2022-06-26 LAB — CERVICOVAGINAL ANCILLARY ONLY
Bacterial Vaginitis (gardnerella): NEGATIVE
Candida Glabrata: POSITIVE — AB
Candida Vaginitis: POSITIVE — AB
Comment: NEGATIVE
Comment: NEGATIVE
Comment: NEGATIVE
Comment: NEGATIVE
Trichomonas: NEGATIVE

## 2022-06-26 MED ORDER — FLUCONAZOLE 150 MG PO TABS
150.0000 mg | ORAL_TABLET | Freq: Once | ORAL | 0 refills | Status: AC
  Filled 2022-06-26: qty 1, 1d supply, fill #0

## 2022-06-26 NOTE — Addendum Note (Signed)
Addended by: Randalyn Rhea on: 06/26/2022 12:41 PM   Modules accepted: Orders

## 2022-06-26 NOTE — Progress Notes (Signed)
Labs positive for candida. Fluc '150mg'$  PO x1 sent to [facility]. Called patient to instruct her to pick this up, and to discontinue Jardiance, which I think is contributing to her recurrent yeast infections.  I also told patient that I've spoken with Sports Medicine, and they do the elbow injections for olecranon bursitis. She can call them to schedule an appointment for an injection if her bursitis is not improved with oral Naproxen.

## 2022-07-13 ENCOUNTER — Ambulatory Visit: Admission: RE | Admit: 2022-07-13 | Payer: 59 | Source: Ambulatory Visit

## 2022-07-15 ENCOUNTER — Other Ambulatory Visit: Payer: Self-pay | Admitting: Internal Medicine

## 2022-07-15 DIAGNOSIS — I10 Essential (primary) hypertension: Secondary | ICD-10-CM

## 2022-07-16 ENCOUNTER — Other Ambulatory Visit (HOSPITAL_COMMUNITY): Payer: Self-pay

## 2022-07-16 MED ORDER — AMLODIPINE-OLMESARTAN 5-20 MG PO TABS
1.0000 | ORAL_TABLET | Freq: Every day | ORAL | 3 refills | Status: DC
  Filled 2022-07-16: qty 90, 90d supply, fill #0
  Filled 2022-10-08: qty 90, 90d supply, fill #1
  Filled 2022-10-24 – 2022-12-15 (×3): qty 90, 90d supply, fill #2

## 2022-07-17 ENCOUNTER — Ambulatory Visit: Payer: 59 | Admitting: Podiatry

## 2022-07-28 ENCOUNTER — Ambulatory Visit (INDEPENDENT_AMBULATORY_CARE_PROVIDER_SITE_OTHER): Payer: 59 | Admitting: Clinical

## 2022-07-28 DIAGNOSIS — F3181 Bipolar II disorder: Secondary | ICD-10-CM | POA: Diagnosis not present

## 2022-07-28 NOTE — Plan of Care (Signed)
  Problem: Depression CCP Problem  1 coping skills Goal: LTG: Reduce frequency, intensity, and duration of depression symptoms as evidenced by: the clients self 1x per session Outcome: Not Progressing Goal: LTG: Kooper WILL SCORE LESS THAN 10 ON THE PATIENT HEALTH QUESTIONNAIRE (PHQ-9) Outcome: Not Progressing Goal: STG: Sebrena WILL IDENTIFY 3 COGNITIVE PATTERNS AND BELIEFS THAT SUPPORT DEPRESSION Outcome: Not Progressing

## 2022-07-28 NOTE — Progress Notes (Signed)
THERAPIST PROGRESS NOTE Virtual Visit via Telephone Note  I connected with Tina [name] on 07/28/22 at  8:00 AM EDT by telephone and verified that I am speaking with the correct person using two identifiers.  Location: Patient: home Provider: office   I discussed the limitations, risks, security and privacy concerns of performing an evaluation and management service by telephone and the availability of in person appointments. I also discussed with the patient that there may be a patient responsible charge related to this service. The patient expressed understanding and agreed to proceed.   Follow Up Instructions: I discussed the assessment and treatment plan with the patient. The patient was provided an opportunity to ask questions and all were answered. The patient agreed with the plan and demonstrated an understanding of the instructions.   The patient was advised to call back or seek an in-person evaluation if the symptoms worsen or if the condition fails to improve as anticipated.   Session Time: 25 minutes  Participation Level: Active  Behavioral Response: NAAlertDepressed  Type of Therapy: Individual Therapy  Treatment Goals addressed: client will reduce frequency, intensity, and duration of depression symptoms as evidenced by the client self-report at least 1 time per session  ProgressTowards Goals: Not Progressing  Interventions: CBT and Supportive  Summary:  Tina [name] is a 51 y.o. female who presents for the scheduled appointment oriented x5 and cooperative.  Client reported auditory hallucinations.  Client denied delusions. Client reported she has been feeling better this week.  Client reported her best friend of 9 years will be moving back to her home state of [place] is coming up Wednesday because her mother felt ill.  Client reported that his friend in particular was very present for her during the time of her mother's illness and  passing.  Client reported feeling like everything that was close to her is going away.  Client reported that was the one person that would get her out of the house.  Client reported otherwise things have been the same and her main daily activity is at home.  Client reported she usually spends her time watching TV but even with that it is hard for her to keep focus and she gets reported.  Client reported her friends told her that she needs to get out of the house but client stated she does not always have the funds to go out.  Client reported she is still having issues with getting full range of motion in her left arm.  Client reported she has tried the physical therapy and has gotten x-rays and the doctors cannot figure out why it has not improved.  Client reported she has to be referred for second opinion.  Client reported she does not feel like her psychiatric medications have been beneficial to help manage her depression or auditory hallucinations. Client reported sometimes the voices are evil and other times not. Client reported no threat to herself or others. Client reported she is sleeping approximately 2 hours at night.  Client reported the psychiatrist referred her for a sleep study but assumed that she was denied because she did not hear back from them. Evidence of progress towards goal: Client reported she is medication compliant 7 days a week.  Client reported using 1 negative pattern/belief that "everyone is leaving me" which contributes to depression  Suicidal/Homicidal: Nowithout intent/plan  Therapist Response:  Therapist began the appointment asking the client how she has been doing. Therapist used CBT to engage using active  listening and positive emotional support. Therapist used CBT to engage and give the client time to discuss her depressive thoughts and feelings related to changes in her social support that are upcoming. Therapist used CBT to empathize and normalize the clients emotional  response. Therapist used CBT to ask the client about medication compliance and its effectiveness of improving AVH and sleeping pattern. Therapist used CBT ask the client to identify her progress with frequency of use with coping skills with continued practice in her daily activity.    Therapist assigned the client homework to find some hands on hobbies she could possibly do at home.   Plan: Return again in 5 weeks.  Diagnosis: bipolar 2 disorder, major depressive episode  Collaboration of Care: Other therapist instructed the client to contact her PCP to ask about a sleep study referral that accepts her insurance.   Patient/Guardian was advised Release of Information must be obtained prior to any record release in order to collaborate their care with an outside provider. Patient/Guardian was advised if they have not already done so to contact the registration department to sign all necessary forms in order for [place] to release information regarding their care.   Consent: Patient/Guardian gives verbal consent for treatment and assignment of benefits for services provided during this visit. Patient/Guardian expressed understanding and agreed to proceed.   [place], LCSW 07/28/2022

## 2022-08-07 ENCOUNTER — Encounter (HOSPITAL_COMMUNITY): Payer: Self-pay | Admitting: Psychiatry

## 2022-08-07 ENCOUNTER — Other Ambulatory Visit (HOSPITAL_COMMUNITY): Payer: Self-pay

## 2022-08-07 ENCOUNTER — Telehealth (INDEPENDENT_AMBULATORY_CARE_PROVIDER_SITE_OTHER): Payer: 59 | Admitting: Psychiatry

## 2022-08-07 DIAGNOSIS — F17210 Nicotine dependence, cigarettes, uncomplicated: Secondary | ICD-10-CM | POA: Diagnosis not present

## 2022-08-07 DIAGNOSIS — F3181 Bipolar II disorder: Secondary | ICD-10-CM

## 2022-08-07 DIAGNOSIS — F5105 Insomnia due to other mental disorder: Secondary | ICD-10-CM | POA: Diagnosis not present

## 2022-08-07 DIAGNOSIS — F411 Generalized anxiety disorder: Secondary | ICD-10-CM | POA: Diagnosis not present

## 2022-08-07 DIAGNOSIS — F17200 Nicotine dependence, unspecified, uncomplicated: Secondary | ICD-10-CM

## 2022-08-07 MED ORDER — BUSPIRONE HCL 15 MG PO TABS
15.0000 mg | ORAL_TABLET | Freq: Three times a day (TID) | ORAL | 3 refills | Status: DC
  Filled 2022-08-07: qty 90, 30d supply, fill #0
  Filled 2022-09-15: qty 90, 30d supply, fill #1
  Filled 2022-10-24: qty 90, 30d supply, fill #2
  Filled 2022-11-24: qty 90, 30d supply, fill #3

## 2022-08-07 MED ORDER — GABAPENTIN 400 MG PO CAPS
400.0000 mg | ORAL_CAPSULE | Freq: Three times a day (TID) | ORAL | 3 refills | Status: DC
  Filled 2022-08-07 – 2022-08-18 (×2): qty 90, 30d supply, fill #0
  Filled 2022-09-15: qty 90, 30d supply, fill #1
  Filled 2022-10-24: qty 90, 30d supply, fill #2
  Filled 2022-11-24: qty 90, 30d supply, fill #3

## 2022-08-07 MED ORDER — ZOLPIDEM TARTRATE 10 MG PO TABS
10.0000 mg | ORAL_TABLET | Freq: Every evening | ORAL | 2 refills | Status: DC | PRN
  Filled 2022-08-07: qty 30, 30d supply, fill #0
  Filled 2022-10-24: qty 30, 30d supply, fill #1

## 2022-08-07 MED ORDER — LAMOTRIGINE 100 MG PO TABS
100.0000 mg | ORAL_TABLET | Freq: Every day | ORAL | 2 refills | Status: DC
  Filled 2022-08-07: qty 30, 30d supply, fill #0
  Filled 2022-09-15: qty 30, 30d supply, fill #1
  Filled 2022-10-24: qty 30, 30d supply, fill #2

## 2022-08-07 MED ORDER — LYBALVI 10-10 MG PO TABS
10.0000 mg | ORAL_TABLET | Freq: Every evening | ORAL | 3 refills | Status: DC
  Filled 2022-08-07 – 2022-08-21 (×4): qty 30, 30d supply, fill #0

## 2022-08-07 MED ORDER — NICOTINE POLACRILEX 4 MG MT GUM
4.0000 mg | CHEWING_GUM | OROMUCOSAL | 3 refills | Status: DC | PRN
  Filled 2022-08-07: qty 110, 11d supply, fill #0

## 2022-08-07 NOTE — Progress Notes (Signed)
[facility] MD/PA/NP OP Progress Note Virtual Visit via Telephone Note  I connected with Tina [name] on 08/07/22 at  8:30 AM EDT by telephone and verified that I am speaking with the correct person using two identifiers.  Location: Patient: home Provider: Clinic   I discussed the limitations, risks, security and privacy concerns of performing an evaluation and management service by telephone and the availability of in person appointments. I also discussed with the patient that there may be a patient responsible charge related to this service. The patient expressed understanding and agreed to proceed.   I provided 30 minutes of non-face-to-face time during this encounter.   08/07/2022 8:58 AM Tina [name]  MRN:  [id number]  Chief Complaint: "I don't think the increase of meds worked."  HPI: 51 year old female seen today for follow up psychiatric evaluation.   She has a psychiatric history of anxiety, depression, insomnia, tobacco use, and bipolar 2 disorder.  She is currently managed on Lamictal 100 mg daily,  Abilify 20 mg daily,Ambien 5 mg, and BuSpar 15 mg 3 times daily and gabapentin 400 mg 3.   She notes that her medications are somewhat effective in managing her psychiatric conditions.     Today patient was unable to log on virtually so the call was done over the phone. During assessment she was pleasant, cooperative, and engaged in conversation.  She informed provider that she does not think the increase of her medications worked.  She reports that she continues to be down most days.  Patient notes that she has been irritable, distractible, having racing thoughts, fluctuations in mood and having auditory hallucinations.  She informed Probation officer that her voices tell her to do things.  She notes that they say negative comments and curse.  Patient informed Probation officer that her anxiety continues to be problematic as well.  She reports that she worries about her family and her  health.  Patient informed writer that she continues to have severe arm pain from a fall.  She injured her rotator cuff a few months ago and quantifies her pain as 8 out of 10.  Patient notes that gabapentin and Flexeril are ineffective.  She notes that she plans to call her doctor today about medication adjustments.    Patient informed writer that the above exacerbates her anxiety and depression.  Provider conducted a GAD-7 and patient scored a 19, at her last visit she scored a 16.  Provider also conducted PHQ-9 and patient scored a 20, at her last visit she scored a 16.  Patient notes that her sleep is poor reporting that she sleeps 2 hours nightly.  Today she denies SI/HI or paranoia.    Patient reports that she finds Abilify ineffective and is agreeable to discontinuing it.  Patient reports that she will try a new antipsychotic but does not want to gain much weight.  At last visit provider discussed Seroquel, Haldol, Vraylar, and Lybalvi.  Today she is agreeable to trialing Lybalvi 10-10 mg to help manage symptoms of psychosis, mood, and sleep.  Ambien increased from 5 mg to 10 mg to help manage sleep.  Potential side effects of medication and risks vs benefits of treatment vs non-treatment were explained and discussed. All questions were answered. She will continue her other medications as prescribed.  No other concerns noted at this time.        Visit Diagnosis:    ICD-10-CM   1. Insomnia due to other mental disorder  F51.05 zolpidem (AMBIEN) 10 MG  tablet   F99     2. Tobacco use disorder  F17.200 nicotine polacrilex (NICORETTE) 4 MG gum    3. Bipolar 2 disorder, major depressive episode (HCC)  F31.81 OLANZapine-Samidorphan (LYBALVI) 10-10 MG TABS    lamoTRIgine (LAMICTAL) 100 MG tablet    gabapentin (NEURONTIN) 400 MG capsule    4. Generalized anxiety disorder  F41.1 gabapentin (NEURONTIN) 400 MG capsule    busPIRone (BUSPAR) 15 MG tablet      Past Psychiatric History:  insomnia,  tobacco use, anxiety, depression, and bipolar 2 disorder Past Medical History:  Past Medical History:  Diagnosis Date   Diabetes mellitus    type II   Fibroids    HTN (hypertension)    Hyperlipidemia    Iron deficiency anemia    Left acetabular fracture (HCC)    Lumbar strain    Obesity    Ovarian cyst     Past Surgical History:  Procedure Laterality Date   FOOT ARTHRODESIS, MODIFIED MCBRIDE     right foot bunion surgery and ankle surgery 1980s   ORIF ACETABULAR FRACTURE     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    Family Psychiatric History: Niece Schizophrenia  Family History:  Family History  Problem Relation Age of Onset   Colon cancer Sister    Diabetes Mother    Diabetes Father    Diabetes Brother    Celiac disease Paternal Aunt     Social History:  Social History   Socioeconomic History   Marital status: Single    Spouse name: Not on file   Number of children: 0   Years of education: 12   Highest education level: Some college, no degree  Occupational History   Occupation: Engineer, materials: UNEMPLOYED    Employer: Engineer, [employer]  Tobacco Use   Smoking status: Every Day    Packs/day: 0.50    Years: 23.00    Total pack years: 11.50    Types: Cigarettes   Smokeless tobacco: Never   Tobacco comments:    1/2PPD  Vaping Use   Vaping Use: Never used  Substance and Sexual Activity   Alcohol use: Yes    Alcohol/week: 0.0 standard drinks of alcohol    Comment: occ   Drug use: No   Sexual activity: Yes    Partners: Female    Birth control/protection: Injection    Comment: Depo  Other Topics Concern   Not on file  Social History Narrative   Homosexual; in a monogamous relationship w/ homosexual woman.   She is disabled.  She last worked in 2018 at [employer].    Right handed   One story home   Caffeine: 1 soda daily, occas drinks coffee    Social Determinants of Health   Financial Resource Strain: High Risk (10/25/2020)   Overall Financial Resource  Strain (CARDIA)    Difficulty of Paying Living Expenses: Hard  Food Insecurity: No Food Insecurity (10/25/2020)   Hunger Vital Sign    Worried About Running Out of Food in the Last Year: Never true    Ran Out of Food in the Last Year: Never true  Transportation Needs: No Transportation Needs (10/25/2020)   PRAPARE - Hydrologist (Medical): No    Lack of Transportation (Non-Medical): No  Physical Activity: Inactive (08/03/2020)   Exercise Vital Sign    Days of Exercise per Week: 0 days    Minutes of Exercise per Session: 0 min  Stress: Stress  Concern Present (10/11/2020)   [place]    Feeling of Stress : Very much  Social Connections: Moderately Isolated (08/03/2020)   Social Connection and Isolation Panel [NHANES]    Frequency of Communication with Friends and Family: More than three times a week    Frequency of Social Gatherings with Friends and Family: More than three times a week    Attends Religious Services: More than 4 times per year    Active Member of [employer] or Organizations: No    Attends Archivist Meetings: Never    Marital Status: Never married    Allergies:  Allergies  Allergen Reactions   Codeine Other (See Comments)    hallucinations   Sglt2 Inhibitors     Recurrent yeast infections on Jardiance, stopped 03/4655    Metabolic Disorder Labs: Lab Results  Component Value Date   HGBA1C 7.4 (A) 06/25/2022   MPG 200 05/25/2017   MPG 237 02/26/2016   No results found for: "PROLACTIN" Lab Results  Component Value Date   CHOL 164 03/11/2022   TRIG 195 (H) 03/11/2022   HDL 33 (L) 03/11/2022   CHOLHDL 5.0 (H) 03/11/2022   VLDL 50 (H) 08/21/2014   LDLCALC 97 03/11/2022   LDLCALC 94 08/08/2020   Lab Results  Component Value Date   TSH 1.074 06/27/2011    Therapeutic Level Labs: No results found for: "LITHIUM" No results found for: "VALPROATE" No results  found for: "CBMZ"  Current Medications: Current Outpatient Medications  Medication Sig Dispense Refill   OLANZapine-Samidorphan (LYBALVI) 10-10 MG TABS Take 10 mg by mouth at bedtime. 30 tablet 3   amLODipine-olmesartan (AZOR) 5-20 MG tablet Take 1 tablet by mouth daily. 90 tablet 3   atorvastatin (LIPITOR) 40 MG tablet Take 1 tablet (40 mg total) by mouth daily. 100 tablet 2   Blood Glucose Monitoring Suppl (CONTOUR NEXT ONE) KIT 1 each by Does not apply route See admin instructions. USE TO CHECK BLOOD GLUCOSE ONCE DAILY. IM PROGRAM. 1 kit 0   busPIRone (BUSPAR) 15 MG tablet Take 1 tablet (15 mg total) by mouth 3 (three) times daily. 90 tablet 3   Continuous Blood Gluc Sensor (FREESTYLE LIBRE 2 SENSOR) MISC Check blood sugar at least 6 times a day 6 each 3   cyclobenzaprine (FLEXERIL) 5 MG tablet Take 1 tablet (5 mg total) by mouth at bedtime as needed for muscle spasms. 30 tablet 1   diazepam (VALIUM) 5 MG tablet Take 1 tablet 30 minutes prior to procedure.  May repeat one time 2 tablet 0   gabapentin (NEURONTIN) 400 MG capsule Take 1 capsule (400 mg total) by mouth 3 (three) times daily. 90 capsule 3   glucose blood (CONTOUR NEXT TEST) test strip Please use to check your sugar 3 times a day. 100 strip 3   Insulin Lispro Prot & Lispro (HUMALOG MIX 75/25 KWIKPEN) (75-25) 100 UNIT/ML Kwikpen Inject 24 Units into the skin in the morning and at bedtime. 15 mL 11   Insulin Pen Needle 32G X 4 MM MISC Use daily at 6pm 100 each 4   lamoTRIgine (LAMICTAL) 100 MG tablet Take 1 tablet (100 mg total) by mouth daily. 30 tablet 2   Lancets MISC 1 Units by Does not apply route 3 (three) times daily. 100 each 3   metFORMIN (GLUCOPHAGE) 1000 MG tablet Take 1 tablet (1,000 mg total) by mouth 2 (two) times daily with a meal. 180 tablet 1  Multiple Vitamins-Minerals (MULTIVITAMIN WITH MINERALS) tablet Take 1 tablet by mouth daily.     naproxen (NAPROSYN) 500 MG tablet Take 1 tablet (500 mg total) by mouth 2  (two) times daily with a meal. 60 tablet 0   nicotine polacrilex (NICORETTE) 4 MG gum Use 1 piece of gum (4 mg total) by mouth as needed for smoking cessation. 110 tablet 3   nitroGLYCERIN (NITRO-DUR) 0.2 mg/hr patch Cut patch into one - fourth pieces. Place a one-fourth (1/4) piece of patch on skin over affected area, changing to a new piece every 24 hours. 30 patch 1   Semaglutide, 2 MG/DOSE, (OZEMPIC, 2 MG/DOSE,) 8 MG/3ML SOPN Inject 2 mg into the skin once a week. 3 mL 3   traMADol (ULTRAM) 50 MG tablet Take 1 tablet (50 mg total) by mouth every 12 (twelve) hours as needed. 20 tablet 0   triamcinolone cream (KENALOG) 0.1 % Apply 1 application topically 2 (two) times daily. 30 g 0   zolpidem (AMBIEN) 10 MG tablet Take 1 tablet (10 mg total) by mouth at bedtime as needed for sleep. 30 tablet 2   No current facility-administered medications for this visit.     Musculoskeletal: Strength & Muscle Tone:  Unable to assess due to telephone visit Gait & Station:  Unable to assess due to telephone visit Patient leans: N/A  Psychiatric Specialty Exam: Review of Systems  There were no vitals taken for this visit.There is no height or weight on file to calculate BMI.  General Appearance:  Unable to assess due to telephone visit  Eye Contact:   Unable to assess due to telephone visit  Speech:  Clear and Coherent and Normal Rate  Volume:  Normal  Mood:  Anxious and Depressed  Affect:  Appropriate and Congruent  Thought Process:  Coherent, Goal Directed and Linear  Orientation:  Full (Time, Place, and Person)  Thought Content: Logical and Hallucinations: Auditory   Suicidal Thoughts:  No  Homicidal Thoughts:  No  Memory:  Immediate;   Good Recent;   Good Remote;   Good  Judgement:  Good  Insight:  Good  Psychomotor Activity:  Normal  Concentration:  Concentration: Good and Attention Span: Good  Recall:  Good  Fund of Knowledge: Good  Language: Good  Akathisia:  No  Handed:  Right  AIMS  (if indicated):Not done  Assets:  Communication Skills Desire for Improvement Financial Resources/Insurance Housing Social Support  ADL's:  Intact  Cognition: WNL  Sleep:  Poor   Screenings: GAD-7    Flowsheet Row Video Visit from 08/07/2022 in [facility] Video Visit from 05/02/2022 in [facility] Video Visit from 12/27/2021 in [facility] Video Visit from 10/11/2021 in [facility] Counselor from 09/11/2021 in [facility]  Total GAD-7 Score _0 [id number]    Flowsheet Row Video Visit from 08/07/2022 in [facility] Video Visit from 05/02/2022 in [facility] Office Visit from 02/13/2022 in [facility] Office Visit from 01/21/2022 in [facility] Video Visit from 12/27/2021 in [facility]  PHQ-2 Total Score 5 4 0 6 6  PHQ-9 Total Score 20 16 0 19 21      Flowsheet Row Video Visit from 12/27/2021 in [facility] Video Visit from 10/11/2021 in [facility]  Center Counselor from 09/11/2021 in [facility] [facility] CATEGORY Error: Q7 should not be populated when Q6 is No Error: Question 6 not populated Low Risk        Assessment and Plan: Patient endorses symptoms of anxiety, depression, psychosis, and insomnia.  Patient reports that she finds Abilify ineffective and is agreeable to discontinuing it.  Patient will taper off Abilify over the next week.  Patient reports that she will try a new antipsychotic but does not want to gain much weight.  At last visit provider discussed Seroquel, Haldol, Vraylar, and Lybalvi.  Today she is agreeable to trialing Lybalvi 10-10 mg to help manage symptoms of psychosis, mood, and sleep.  Ambien  increased from 5 mg to 10 mg to help manage sleep.  She will continue her other medications as prescribed.  1. Insomnia due to other mental disorder  Increased- zolpidem (AMBIEN) 10 MG tablet; Take 1 tablet (10 mg total) by mouth at bedtime as needed for sleep.  Dispense: 30 tablet; Refill: 2  2. Tobacco use disorder  Continue- nicotine polacrilex (NICORETTE) 4 MG gum; Use 1 piece of gum (4 mg total) by mouth as needed for smoking cessation.  Dispense: 110 tablet; Refill: 3  3. Bipolar 2 disorder, major depressive episode (HCC)  Start- OLANZapine-Samidorphan (LYBALVI) 10-10 MG TABS; Take 10 mg by mouth at bedtime.  Dispense: 30 tablet; Refill: 3 Continue- lamoTRIgine (LAMICTAL) 100 MG tablet; Take 1 tablet (100 mg total) by mouth daily.  Dispense: 30 tablet; Refill: 2 Continue- gabapentin (NEURONTIN) 400 MG capsule; Take 1 capsule (400 mg total) by mouth 3 (three) times daily.  Dispense: 90 capsule; Refill: 3  4. Generalized anxiety disorder  Continue- gabapentin (NEURONTIN) 400 MG capsule; Take 1 capsule (400 mg total) by mouth 3 (three) times daily.  Dispense: 90 capsule; Refill: 3 Continue- busPIRone (BUSPAR) 15 MG tablet; Take 1 tablet (15 mg total) by mouth 3 (three) times daily.  Dispense: 90 tablet; Refill: 3    Follow up in 3 month  Follow up with therapy Salley Slaughter, NP 08/07/2022, 8:58 AM

## 2022-08-08 ENCOUNTER — Other Ambulatory Visit (HOSPITAL_COMMUNITY): Payer: Self-pay | Admitting: Psychiatry

## 2022-08-08 ENCOUNTER — Other Ambulatory Visit (HOSPITAL_COMMUNITY): Payer: Self-pay

## 2022-08-08 DIAGNOSIS — F411 Generalized anxiety disorder: Secondary | ICD-10-CM

## 2022-08-08 MED ORDER — SERTRALINE HCL 50 MG PO TABS
50.0000 mg | ORAL_TABLET | Freq: Every day | ORAL | 3 refills | Status: DC
  Filled 2022-08-08: qty 90, 90d supply, fill #0
  Filled 2022-10-24: qty 90, 90d supply, fill #1
  Filled 2023-02-02 – 2023-02-17 (×2): qty 90, 90d supply, fill #2

## 2022-08-11 ENCOUNTER — Ambulatory Visit: Payer: 59 | Admitting: Podiatry

## 2022-08-12 ENCOUNTER — Other Ambulatory Visit (HOSPITAL_COMMUNITY): Payer: Self-pay

## 2022-08-13 ENCOUNTER — Telehealth (HOSPITAL_COMMUNITY): Payer: Self-pay | Admitting: *Deleted

## 2022-08-13 ENCOUNTER — Ambulatory Visit (INDEPENDENT_AMBULATORY_CARE_PROVIDER_SITE_OTHER): Payer: 59 | Admitting: Clinical

## 2022-08-13 DIAGNOSIS — F3181 Bipolar II disorder: Secondary | ICD-10-CM | POA: Diagnosis not present

## 2022-08-13 NOTE — Telephone Encounter (Signed)
Appeal for patients Lybalvi submitted with [facility] which is medicare. Waiting on determination

## 2022-08-13 NOTE — Progress Notes (Signed)
THERAPIST PROGRESS NOTE Virtual Visit via Telephone Note  I connected with Tina [name] on 08/13/2022 at  8:00 AM EDT by telephone and verified that I am speaking with the correct person using two identifiers.  Location: Patient: home Provider: office   I discussed the limitations, risks, security and privacy concerns of performing an evaluation and management service by telephone and the availability of in person appointments. I also discussed with the patient that there may be a patient responsible charge related to this service. The patient expressed understanding and agreed to proceed.   Follow Up Instructions: I discussed the assessment and treatment plan with the patient. The patient was provided an opportunity to ask questions and all were answered. The patient agreed with the plan and demonstrated an understanding of the instructions.   The patient was advised to call back or seek an in-person evaluation if the symptoms worsen or if the condition fails to improve as anticipated.   Session Time: 25 minutes  Participation Level: Active  Behavioral Response: CasualAlertDepressed  Type of Therapy: Individual Therapy  Treatment Goals addressed: client will reduce frequency,intensity, and duration of depression symptoms as evidenced by the client self report 1x per session  ProgressTowards Goals: Not Progressing  Interventions: CBT and Supportive  Summary:  Tina [name] is a 51 y.o. female who presents for scheduled appointment oriented x5, appropriately dressed, and friendly.  Client reported auditory hallucinations and denied delusions. Client reported on today she is doing fairly okay.  Client reported her best friend has moved back to her hometown out of state to take care of the parent.  Client reported she keeps in contact with her best friend but she feels "blah".  Client reported that was really the 1 person that would get her out of the  house.  Client reported she may go over to her brother's house this weekend for a cookout if the weather is not bad.  Client reported otherwise she has been spending her time at the house.  Client reported she will wake up and get herself dressed and needs something.  Client reported since last appointment the psychiatrist adjusted her medications.  Client reported she has not been able to receive those medications because they needed preauthorization and would like to speak with the nurse about getting that situated. Evidence of progress towards goal: Client reported medication compliant 7 days a week. Client reported 1 source of her depression related to having lack of social support that is within reach.   Suicidal/Homicidal: Nowithout intent/plan  Therapist Response:  Therapist began the appointment asking the client how she has been doing since last seen. Therapist used CBT to engage using active listening and positive emotional support towards her thoughts and feelings. Therapist used CBT to ask the client how she is adjusting to the changes in her social support. Therapist used CBT to discuss engaging in positive behavioral activation activities that are within range. Therapist used CBT ask the client to identify her progress with frequency of use with coping skills with continued practice in her daily activity.    Therapist assigned to client to engage in self-care activities.    Plan: Return again in 2 weeks.  Diagnosis: Bipolar 2 disorder, major depressive episode  Collaboration of Care: Other client inquired that the therapist check with the nurse and her psychiatrist about the denial that she received from the pharmacy about her psych medications.  Patient/Guardian was advised Release of Information must be obtained prior to any  record release in order to collaborate their care with an outside provider. Patient/Guardian was advised if they have not already done so to contact the  registration department to sign all necessary forms in order for [place] to release information regarding their care.   Consent: Patient/Guardian gives verbal consent for treatment and assignment of benefits for services provided during this visit. Patient/Guardian expressed understanding and agreed to proceed.   [place], LCSW 08/13/2022

## 2022-08-14 ENCOUNTER — Ambulatory Visit (INDEPENDENT_AMBULATORY_CARE_PROVIDER_SITE_OTHER): Payer: 59 | Admitting: Podiatry

## 2022-08-14 ENCOUNTER — Other Ambulatory Visit (HOSPITAL_COMMUNITY): Payer: Self-pay

## 2022-08-14 ENCOUNTER — Telehealth (HOSPITAL_COMMUNITY): Payer: Self-pay | Admitting: *Deleted

## 2022-08-14 DIAGNOSIS — M216X2 Other acquired deformities of left foot: Secondary | ICD-10-CM

## 2022-08-14 DIAGNOSIS — M19071 Primary osteoarthritis, right ankle and foot: Secondary | ICD-10-CM

## 2022-08-14 DIAGNOSIS — Q828 Other specified congenital malformations of skin: Secondary | ICD-10-CM | POA: Diagnosis not present

## 2022-08-14 DIAGNOSIS — E1149 Type 2 diabetes mellitus with other diabetic neurological complication: Secondary | ICD-10-CM

## 2022-08-14 MED ORDER — IBUPROFEN 800 MG PO TABS
800.0000 mg | ORAL_TABLET | Freq: Three times a day (TID) | ORAL | 0 refills | Status: DC | PRN
  Filled 2022-08-14: qty 30, 10d supply, fill #0

## 2022-08-14 NOTE — Telephone Encounter (Signed)
Followed up on the appeal I submitted yesterday pm for patients Lybalvi. No determination yet, they have till 08/19/22 to make a decision and will notify clinic via phone or and fax.

## 2022-08-14 NOTE — Telephone Encounter (Signed)
Thank you for this update 

## 2022-08-15 NOTE — Plan of Care (Signed)
  Problem: Depression CCP Problem  1 coping skills Goal: STG: Ember WILL IDENTIFY 3 COGNITIVE PATTERNS AND BELIEFS THAT SUPPORT DEPRESSION Outcome: Progressing   Problem: Depression CCP Problem  1 coping skills Goal: LTG: Reduce frequency, intensity, and duration of depression symptoms as evidenced by: the clients self 1x per session Outcome: Not Progressing Goal: LTG: Soffia WILL SCORE LESS THAN 10 ON THE PATIENT HEALTH QUESTIONNAIRE (PHQ-9) Outcome: Not Progressing

## 2022-08-17 ENCOUNTER — Encounter (HOSPITAL_BASED_OUTPATIENT_CLINIC_OR_DEPARTMENT_OTHER): Payer: Self-pay | Admitting: Emergency Medicine

## 2022-08-17 ENCOUNTER — Other Ambulatory Visit: Payer: Self-pay

## 2022-08-17 ENCOUNTER — Emergency Department (HOSPITAL_BASED_OUTPATIENT_CLINIC_OR_DEPARTMENT_OTHER)
Admission: EM | Admit: 2022-08-17 | Discharge: 2022-08-17 | Disposition: A | Discharge status: Home / Self Care | Payer: 59 | Attending: Emergency Medicine | Admitting: Emergency Medicine

## 2022-08-17 DIAGNOSIS — Z7984 Long term (current) use of oral hypoglycemic drugs: Secondary | ICD-10-CM | POA: Diagnosis not present

## 2022-08-17 DIAGNOSIS — M5416 Radiculopathy, lumbar region: Secondary | ICD-10-CM | POA: Insufficient documentation

## 2022-08-17 DIAGNOSIS — M5442 Lumbago with sciatica, left side: Secondary | ICD-10-CM | POA: Diagnosis not present

## 2022-08-17 DIAGNOSIS — M545 Low back pain, unspecified: Secondary | ICD-10-CM | POA: Diagnosis present

## 2022-08-17 DIAGNOSIS — G8929 Other chronic pain: Secondary | ICD-10-CM

## 2022-08-17 DIAGNOSIS — E119 Type 2 diabetes mellitus without complications: Secondary | ICD-10-CM | POA: Diagnosis not present

## 2022-08-17 DIAGNOSIS — Z79899 Other long term (current) drug therapy: Secondary | ICD-10-CM | POA: Insufficient documentation

## 2022-08-17 DIAGNOSIS — Z794 Long term (current) use of insulin: Secondary | ICD-10-CM | POA: Insufficient documentation

## 2022-08-17 MED ORDER — KETOROLAC TROMETHAMINE 30 MG/ML IJ SOLN
30.0000 mg | Freq: Once | INTRAMUSCULAR | Status: AC
  Administered 2022-08-17: 30 mg via INTRAMUSCULAR
  Filled 2022-08-17: qty 1

## 2022-08-17 MED ORDER — DEXAMETHASONE SODIUM PHOSPHATE 10 MG/ML IJ SOLN
12.0000 mg | Freq: Once | INTRAMUSCULAR | Status: AC
  Administered 2022-08-17: 12 mg via INTRAMUSCULAR
  Filled 2022-08-17: qty 2

## 2022-08-17 NOTE — ED Triage Notes (Signed)
Pt arrives pov, to triage in wheelchair, c/o lower back pain radiating to left leg x 3 days. Took flexeril x 2 hrs pta with no relief

## 2022-08-17 NOTE — Progress Notes (Signed)
Subjective: Chief Complaint  Patient presents with   Callouses    Patient came in today for left foot callus, diabetic- A1c- 7.5, BG- not taking at this time,     51 year old female presents the office today for concerns of painful calluses on both of her feet.  She states they are starting to cause discomfort with walking.  No recent injury.  She still having ongoing chronic pain of the right and go.  Last A1c was 7.4 on June 25, 2022  Objective: AAO x3, NAD DP/PT pulses palpable bilaterally, CRT less than 3 seconds Significant bunions are present bilateral with hammertoes deformities bilaterally.  Flatfoot is present.  Hyperkeratotic lesion sulcus of the right first interspace, left submetatarsal 2 as well as fifth metatarsal head on the left foot.  There is no underlying ulceration drainage or any signs of infection.  There is no ulcerations. Mild diffuse discomfort of the ankle with mild swelling.  Most of her discomfort with ambulation.  Previous triple arthrodesis noted.  Flatfoot present. No pain with calf compression, swelling, warmth, erythema  Assessment: Right ankle arthritis, flatfoot; hyperkeratotic lesions  Plan: -All treatment options discussed with the patient including all alternatives, risks, complications.  -Sharply debrided hyperkeratotic lesions x3 without any complications or bleeding -Continue with bracing, supportive shoe gear for the right ankle.  Offered steroid injection but she wanted to hold off on this.  Ultimately discussed she may need to have an ankle arthrodesis. -Her blood sugar is much better controlled. -Patient encouraged to call the office with any questions, concerns, change in symptoms.   [name] DPM

## 2022-08-17 NOTE — ED Provider Notes (Signed)
[facility] Provider Note   CSN: [id number] Arrival date & time: 08/17/22  1029     History  Chief Complaint  Patient presents with   Back Pain    Tina [name] is a 51 y.o. female.  Patient presents to the hospital complaining of left-sided low back pain with radicular symptoms down the left leg.  Patient states she has a history of similar issues going back at least 7 or 8 years.  She has known degenerative disc disease in the lumbar area.  She saw neurosurgery over 7 years ago who recommended what sounds like a nerve ablation.  The patient states that she was told that it was unknown if it would work or not and declined to have the procedure.  She states that since that time she has had intermittent episodes of "sciatica" but that they are sporadic in nature.  The current episode has been ongoing for 3 days.  She states that the pain goes from her buttock down the lateral portion of her left leg all the way down to the level of the ankle.  She describes the pain as sharp or burning.  She denies any weakness or loss of sensation.  She denies any new injury or trauma.  Denies saddle anesthesia, urinary incontinence or retention, fecal incontinence.  Past medical history significant for type 2 diabetes, obesity, bipolar 2 disorder, generalized anxiety disorder, lumbar disc herniation with radiculopathy  HPI     Home Medications Prior to Admission medications   Medication Sig Start Date End Date Taking? Authorizing Provider  amLODipine-olmesartan (AZOR) 5-20 MG tablet Take 1 tablet by mouth daily. 07/16/22   [name], MD  atorvastatin (LIPITOR) 40 MG tablet Take 1 tablet (40 mg total) by mouth daily. 01/08/22 11/06/22  Lyndal Pulley, MD  Blood Glucose Monitoring Suppl (CONTOUR NEXT ONE) KIT 1 each by Does not apply route See admin instructions. USE TO CHECK BLOOD GLUCOSE ONCE DAILY. IM PROGRAM. 01/25/18   [name], MD  busPIRone  (BUSPAR) 15 MG tablet Take 1 tablet (15 mg total) by mouth 3 (three) times daily. 08/07/22   Salley Slaughter, NP  Continuous Blood Gluc Sensor (FREESTYLE LIBRE 2 SENSOR) MISC Check blood sugar at least 6 times a day 10/17/21   Lyndal Pulley, MD  cyclobenzaprine (FLEXERIL) 5 MG tablet Take 1 tablet (5 mg total) by mouth at bedtime as needed for muscle spasms. 04/11/22   Dickie La, MD  diazepam (VALIUM) 5 MG tablet Take 1 tablet 30 minutes prior to procedure.  May repeat one time 06/09/22   Dene Gentry, MD  gabapentin (NEURONTIN) 400 MG capsule Take 1 capsule (400 mg total) by mouth 3 (three) times daily. 08/07/22   Eulis Canner E, NP  glucose blood (CONTOUR NEXT TEST) test strip Please use to check your sugar 3 times a day. 05/09/19   Welford Roche, MD  ibuprofen (ADVIL) 800 MG tablet Take 1 tablet (800 mg total) by mouth every 8 (eight) hours as needed. 08/14/22   [name], DPM  Insulin Lispro Prot & Lispro (HUMALOG MIX 75/25 KWIKPEN) (75-25) 100 UNIT/ML Kwikpen Inject 24 Units into the skin in the morning and at bedtime. 03/04/22   Lyndal Pulley, MD  Insulin Pen Needle 32G X 4 MM MISC Use daily at 6pm 06/26/21   Lyndal Pulley, MD  lamoTRIgine (LAMICTAL) 100 MG tablet Take 1 tablet (100 mg total) by mouth daily. 08/07/22   Ronne Binning,  Brittney E, NP  Lancets MISC 1 Units by Does not apply route 3 (three) times daily. 05/09/19   Welford Roche, MD  metFORMIN (GLUCOPHAGE) 1000 MG tablet Take 1 tablet (1,000 mg total) by mouth 2 (two) times daily with a meal. 04/15/22   Orvis Brill, MD  Multiple Vitamins-Minerals (MULTIVITAMIN WITH MINERALS) tablet Take 1 tablet by mouth daily.    [provider]  naproxen (NAPROSYN) 500 MG tablet Take 1 tablet (500 mg total) by mouth 2 (two) times daily with a meal. 06/25/22   [name], MD  nicotine polacrilex (NICORETTE) 4 MG gum Use 1 piece of gum (4 mg total) by mouth as needed for smoking cessation. 08/07/22   Salley Slaughter, NP  nitroGLYCERIN (NITRO-DUR) 0.2 mg/hr patch Cut patch into one - fourth pieces. Place a one-fourth (1/4) piece of patch on skin over affected area, changing to a new piece every 24 hours. 04/11/22   Dickie La, MD  OLANZapine-Samidorphan (LYBALVI) 10-10 MG TABS Take 10 mg by mouth at bedtime. 08/07/22   Salley Slaughter, NP  Semaglutide, 2 MG/DOSE, (OZEMPIC, 2 MG/DOSE,) 8 MG/3ML SOPN Inject 2 mg into the skin once a week. 06/25/22   [name], MD  sertraline (ZOLOFT) 50 MG tablet Take 1 tablet (50 mg total) by mouth daily. 08/08/22   Salley Slaughter, NP  traMADol (ULTRAM) 50 MG tablet Take 1 tablet (50 mg total) by mouth every 12 (twelve) hours as needed. 02/13/22   Farrel Gordon, DO  triamcinolone cream (KENALOG) 0.1 % Apply 1 application topically 2 (two) times daily. 01/21/22   Orvis Brill, MD  zolpidem (AMBIEN) 10 MG tablet Take 1 tablet (10 mg total) by mouth at bedtime as needed for sleep. 08/07/22   Salley Slaughter, NP      Allergies    Codeine and Sglt2 inhibitors    Review of Systems   Review of Systems  Musculoskeletal:  Positive for arthralgias and back pain.  Neurological:  Negative for weakness.    Physical Exam Updated Vital Signs BP (!) 151/71 (BP Location: Right Arm)   Pulse (!) 110   Temp 98.4 F (36.9 C) (Oral)   Resp 16   Ht 6' 4" (1.93 m)   Wt 130.2 kg   SpO2 95%   BMI 34.93 kg/m  Physical Exam Vitals and nursing note reviewed.  Constitutional:      General: She is not in acute distress.    Appearance: She is well-developed.  HENT:     Head: Normocephalic and atraumatic.  Eyes:     Conjunctiva/sclera: Conjunctivae normal.  Cardiovascular:     Rate and Rhythm: Normal rate and regular rhythm.     Heart sounds: No murmur heard. Pulmonary:     Effort: Pulmonary effort is normal. No respiratory distress.     Breath sounds: Normal breath sounds.  Abdominal:     Palpations: Abdomen is soft.     Tenderness: There is no abdominal  tenderness.  Musculoskeletal:        General: Tenderness (Mild tenderness to the left lumbar region with no midline tenderness) present. No swelling or deformity. Normal range of motion.     Cervical back: Neck supple.     Comments: Pain radiates down the left lateral leg with straight leg raise  Skin:    General: Skin is warm and dry.     Capillary Refill: Capillary refill takes less than 2 seconds.  Neurological:     General:  No focal deficit present.     Mental Status: She is alert and oriented to person, place, and time.     Sensory: No sensory deficit.     Motor: No weakness.  Psychiatric:        Mood and Affect: Mood normal.     ED Results / Procedures / Treatments   Labs (all labs ordered are listed, but only abnormal results are displayed) Labs Reviewed - No data to display  EKG None  Radiology No results found.  Procedures Procedures    Medications Ordered in ED Medications  dexamethasone (DECADRON) injection 12 mg (12 mg Intramuscular Given 08/17/22 1203)  ketorolac (TORADOL) 30 MG/ML injection 30 mg (30 mg Intramuscular Given 08/17/22 1203)    ED Course/ Medical Decision Making/ A&P                           Medical Decision Making Risk Prescription drug management.   This patient presents to the ED for concern of low back pain with left-sided radicular symptoms, this involves an extensive number of treatment options, and is a complaint that carries with it a high risk of complications and morbidity.  The differential diagnosis includes lumbar radiculopathy, fracture, dislocation, neuropathy, cauda equina, others   Co morbidities that complicate the patient evaluation  History of lumbar disc herniation with radiculopathy   Additional history obtained:   External records from outside source obtained and reviewed including recent podiatry visits for type 2 diabetes and orthopedic visits for left shoulder pain   Lab Tests:  I Ordered, and  personally interpreted labs.  The pertinent results include:  N/A   Imaging Studies ordered:  The patient has no new trauma, no red flag symptoms such as saddle anesthesia, urinary incontinence, urinary retention, fecal incontinence that would necessitate repeat imaging today  Problem List / ED Course / Critical interventions / Medication management   I ordered medication including Toradol and Decadron for inflammation Reevaluation of the patient after these medicines showed that the patient improved I have reviewed the patients home medicines and have made adjustments as needed   Test / Admission - Considered:  The patient presentation is consistent with her previous lumbar radiculopathy.  She states in the past Toradol and steroid injections have helped alleviate her pain and that she typically goes many months in between episodes.  I feel it is reasonable to treat her condition the same way today.  She was administered Toradol and Decadron.  She is diabetic and was instructed to monitor her blood glucose over the next few days due to potential spikes from the Decadron.  Patient will follow-up as needed with spine surgery but is currently uninterested in any sort of invasive procedures.  No signs of cauda equina.  No evidence of new trauma, fracture.  Presentation inconsistent with neuropathy.  Discharge home        Final Clinical Impression(s) / ED Diagnoses Final diagnoses:  Lumbar radiculopathy  Chronic left-sided low back pain with left-sided sciatica    Rx / DC Orders ED Discharge Orders     None         Ronny Bacon 08/17/22 1205    Cristie Hem, MD 08/17/22 1239

## 2022-08-17 NOTE — ED Notes (Addendum)
Patient reported back pain since Friday Lower left back pain that radiated down her  leg.Patient can ambulate Leg has full movement Denies any dysuria denies any n/v

## 2022-08-17 NOTE — Discharge Instructions (Addendum)
You were seen today for low back pain which is consistent with previous visits for low back pain with left-sided nerve involvement.  You were administered a steroid and an anti-inflammatory today.  You may resume ibuprofen once home tonight.  I recommend follow-up with a spine specialist in the future if you continue to have issues with this problem.  If you develop any life-threatening conditions please return to the emergency department

## 2022-08-18 ENCOUNTER — Other Ambulatory Visit (HOSPITAL_COMMUNITY): Payer: Self-pay

## 2022-08-19 ENCOUNTER — Telehealth: Payer: Self-pay

## 2022-08-19 NOTE — Patient Outreach (Signed)
  Care Coordination TOC Note Transition Care Management Unsuccessful Follow-up Telephone Call  Date of discharge and from where:  [facility]-08/17/22  Attempts:  1st Attempt  Reason for unsuccessful TCM follow-up call:  No answer/busy

## 2022-08-20 ENCOUNTER — Other Ambulatory Visit (HOSPITAL_COMMUNITY): Payer: Self-pay

## 2022-08-20 ENCOUNTER — Ambulatory Visit (INDEPENDENT_AMBULATORY_CARE_PROVIDER_SITE_OTHER): Payer: 59 | Admitting: *Deleted

## 2022-08-20 DIAGNOSIS — Z309 Encounter for contraceptive management, unspecified: Secondary | ICD-10-CM | POA: Diagnosis not present

## 2022-08-20 MED ORDER — MEDROXYPROGESTERONE ACETATE 104 MG/0.65ML ~~LOC~~ SUSY
104.0000 mg | PREFILLED_SYRINGE | Freq: Once | SUBCUTANEOUS | Status: AC
  Administered 2022-08-20: 104 mg via SUBCUTANEOUS

## 2022-08-20 NOTE — Progress Notes (Signed)
    Patient presents for Depo Provera injection States feeling well  Last injection received 05/27/22 Patient is within dates for injection  Depo Provera given SQ right anterior thigh. Patient tolerated well.  Next injection due Jan 10-24, 2024  Reminder card given  L. Shaneka Efaw, BSN, RN-BC

## 2022-08-21 ENCOUNTER — Other Ambulatory Visit (HOSPITAL_COMMUNITY): Payer: Self-pay

## 2022-08-22 ENCOUNTER — Telehealth (HOSPITAL_COMMUNITY): Payer: Self-pay | Admitting: *Deleted

## 2022-08-22 NOTE — Telephone Encounter (Signed)
Patient called re Lybalvi. I called [place] [employer] to check on the status and it was denied on appeal. I will forward this info to the provider to consider other medicines and she can call and discuss with patient.

## 2022-08-25 ENCOUNTER — Other Ambulatory Visit (HOSPITAL_COMMUNITY): Payer: Self-pay | Admitting: Psychiatry

## 2022-08-25 ENCOUNTER — Other Ambulatory Visit (HOSPITAL_COMMUNITY): Payer: Self-pay

## 2022-08-25 MED ORDER — ARIPIPRAZOLE 30 MG PO TABS
30.0000 mg | ORAL_TABLET | Freq: Every day | ORAL | 3 refills | Status: DC
  Filled 2022-08-25 – 2022-09-15 (×2): qty 30, 30d supply, fill #0
  Filled 2022-10-24: qty 30, 30d supply, fill #1
  Filled 2022-11-24: qty 30, 30d supply, fill #2

## 2022-08-25 NOTE — Telephone Encounter (Signed)
Patient informed Probation officer that she continues to have hallucinations.  She notes that she sees dead people.  Provider informed patient that [place] [employer] had denied the appeal for Lybalvi.  Provider discussed patient trialing Seroquel, Haldol, Vraylar, Invega, or Rexulti.  Patient notes that she does not wish to gain weight.  Provider informed patient that Orvan July, or Rexulti may be beneficial.  She notes that she prefers to continue Abilify.  Provider recommended increasing Abilify 20 mg to 30 mg to help manage symptoms of psychosis.  She endorsed understanding and agreed.  No other concerns at this time.

## 2022-08-27 ENCOUNTER — Other Ambulatory Visit (HOSPITAL_COMMUNITY): Payer: Self-pay

## 2022-09-03 ENCOUNTER — Other Ambulatory Visit (HOSPITAL_COMMUNITY): Payer: Self-pay

## 2022-09-15 ENCOUNTER — Other Ambulatory Visit (HOSPITAL_COMMUNITY): Payer: Self-pay

## 2022-09-16 ENCOUNTER — Other Ambulatory Visit (HOSPITAL_COMMUNITY): Payer: Self-pay

## 2022-09-17 ENCOUNTER — Other Ambulatory Visit (HOSPITAL_COMMUNITY): Payer: Self-pay

## 2022-09-29 ENCOUNTER — Other Ambulatory Visit: Payer: Self-pay | Admitting: Internal Medicine

## 2022-09-29 DIAGNOSIS — Z1231 Encounter for screening mammogram for malignant neoplasm of breast: Secondary | ICD-10-CM

## 2022-10-01 ENCOUNTER — Ambulatory Visit
Admission: RE | Admit: 2022-10-01 | Discharge: 2022-10-01 | Disposition: A | Discharge status: Home / Self Care | Payer: 59 | Source: Ambulatory Visit | Attending: Internal Medicine | Admitting: Internal Medicine

## 2022-10-01 DIAGNOSIS — Z1231 Encounter for screening mammogram for malignant neoplasm of breast: Secondary | ICD-10-CM

## 2022-10-02 ENCOUNTER — Encounter: Payer: Self-pay | Admitting: Internal Medicine

## 2022-10-02 ENCOUNTER — Other Ambulatory Visit: Payer: Self-pay

## 2022-10-02 ENCOUNTER — Ambulatory Visit (INDEPENDENT_AMBULATORY_CARE_PROVIDER_SITE_OTHER): Payer: 59 | Admitting: Internal Medicine

## 2022-10-02 VITALS — BP 140/63 | HR 115 | Temp 98.7°F | Ht 76.0 in | Wt 278.3 lb

## 2022-10-02 DIAGNOSIS — M67912 Unspecified disorder of synovium and tendon, left shoulder: Secondary | ICD-10-CM

## 2022-10-02 NOTE — Patient Instructions (Signed)
Thank you, [name] for allowing [place] to provide your care today. Today we discussed:  Left shoulder and hand pain: We have re-ordered the MRI to get done. We will try to get the open MRI, but if we cannot, we will need to send you to anesthesia so they can sedate you before the MRI. I would also make a follow up appointment with your sports med doctor   I have ordered the following labs for you:  Lab Orders  No laboratory test(s) ordered today     Tests ordered today:  MRI L shoulder  Referrals ordered today:   Referral Orders  No referral(s) requested today     I have ordered the following medication/changed the following medications:   Stop the following medications: There are no discontinued medications.   Start the following medications: No orders of the defined types were placed in this encounter.    Follow up:  1 month to re-assess pain     Should you have any questions or concerns please call the internal medicine clinic at [phone].     Buddy Duty, D.O. [place]

## 2022-10-02 NOTE — Progress Notes (Signed)
CC: left arm and hand pain  HPI:  [name] is a 51 y.o. female living with a history stated below and presents today for evaluation of right arm and hand pain. Please see problem based assessment and plan for additional details.  Past Medical History:  Diagnosis Date   Diabetes mellitus    type II   Fibroids    HTN (hypertension)    Hyperlipidemia    Iron deficiency anemia    Left acetabular fracture (HCC)    Lumbar strain    Obesity    Ovarian cyst     Current Outpatient Medications on File Prior to Visit  Medication Sig Dispense Refill   amLODipine-olmesartan (AZOR) 5-20 MG tablet Take 1 tablet by mouth daily. 90 tablet 3   ARIPiprazole (ABILIFY) 30 MG tablet Take 1 tablet (30 mg total) by mouth daily. 30 tablet 3   atorvastatin (LIPITOR) 40 MG tablet Take 1 tablet (40 mg total) by mouth daily. 100 tablet 2   Blood Glucose Monitoring Suppl (CONTOUR NEXT ONE) KIT 1 each by Does not apply route See admin instructions. USE TO CHECK BLOOD GLUCOSE ONCE DAILY. IM PROGRAM. 1 kit 0   busPIRone (BUSPAR) 15 MG tablet Take 1 tablet (15 mg total) by mouth 3 (three) times daily. 90 tablet 3   Continuous Blood Gluc Sensor (FREESTYLE LIBRE 2 SENSOR) MISC Check blood sugar at least 6 times a day 6 each 3   cyclobenzaprine (FLEXERIL) 5 MG tablet Take 1 tablet (5 mg total) by mouth at bedtime as needed for muscle spasms. 30 tablet 1   diazepam (VALIUM) 5 MG tablet Take 1 tablet 30 minutes prior to procedure.  May repeat one time 2 tablet 0   gabapentin (NEURONTIN) 400 MG capsule Take 1 capsule (400 mg total) by mouth 3 (three) times daily. 90 capsule 3   glucose blood (CONTOUR NEXT TEST) test strip Please use to check your sugar 3 times a day. 100 strip 3   ibuprofen (ADVIL) 800 MG tablet Take 1 tablet (800 mg total) by mouth every 8 (eight) hours as needed. 30 tablet 0   Insulin Lispro Prot & Lispro (HUMALOG MIX 75/25 KWIKPEN) (75-25) 100 UNIT/ML Kwikpen Inject 24 Units  into the skin in the morning and at bedtime. 15 mL 11   Insulin Pen Needle 32G X 4 MM MISC Use daily at 6pm 100 each 4   lamoTRIgine (LAMICTAL) 100 MG tablet Take 1 tablet (100 mg total) by mouth daily. 30 tablet 2   Lancets MISC 1 Units by Does not apply route 3 (three) times daily. 100 each 3   metFORMIN (GLUCOPHAGE) 1000 MG tablet Take 1 tablet (1,000 mg total) by mouth 2 (two) times daily with a meal. 180 tablet 1   Multiple Vitamins-Minerals (MULTIVITAMIN WITH MINERALS) tablet Take 1 tablet by mouth daily.     naproxen (NAPROSYN) 500 MG tablet Take 1 tablet (500 mg total) by mouth 2 (two) times daily with a meal. 60 tablet 0   nicotine polacrilex (NICORETTE) 4 MG gum Use 1 piece of gum (4 mg total) by mouth as needed for smoking cessation. 110 tablet 3   nitroGLYCERIN (NITRO-DUR) 0.2 mg/hr patch Cut patch into one - fourth pieces. Place a one-fourth (1/4) piece of patch on skin over affected area, changing to a new piece every 24 hours. 30 patch 1   Semaglutide, 2 MG/DOSE, (OZEMPIC, 2 MG/DOSE,) 8 MG/3ML SOPN Inject 2 mg into the skin once a week. 3 mL  3   sertraline (ZOLOFT) 50 MG tablet Take 1 tablet (50 mg total) by mouth daily. 90 tablet 3   traMADol (ULTRAM) 50 MG tablet Take 1 tablet (50 mg total) by mouth every 12 (twelve) hours as needed. 20 tablet 0   triamcinolone cream (KENALOG) 0.1 % Apply 1 application topically 2 (two) times daily. 30 g 0   zolpidem (AMBIEN) 10 MG tablet Take 1 tablet (10 mg total) by mouth at bedtime as needed for sleep. 30 tablet 2   No current facility-administered medications on file prior to visit.    Family History  Problem Relation Age of Onset   Colon cancer Sister    Diabetes Mother    Diabetes Father    Diabetes Brother    Celiac disease Paternal Aunt     Social History   Socioeconomic History   Marital status: Single    Spouse name: Not on file   Number of children: 0   Years of education: 12   Highest education level: Some college, no  degree  Occupational History   Occupation: Engineer, materials: UNEMPLOYED    Employer: [employer]  Tobacco Use   Smoking status: Every Day    Packs/day: 0.50    Years: 23.00    Total pack years: 11.50    Types: Cigarettes   Smokeless tobacco: Never   Tobacco comments:    1/2PPD  Vaping Use   Vaping Use: Never used  Substance and Sexual Activity   Alcohol use: Yes    Alcohol/week: 0.0 standard drinks of alcohol    Comment: occ   Drug use: No   Sexual activity: Yes    Partners: Female    Birth control/protection: Injection    Comment: Depo  Other Topics Concern   Not on file  Social History Narrative   Homosexual; in a monogamous relationship w/ homosexual woman.   She is disabled.  She last worked in 2018 at [employer].    Right handed   One story home   Caffeine: 1 soda daily, occas drinks coffee    Social Determinants of Health   Financial Resource Strain: High Risk (10/25/2020)   Overall Financial Resource Strain (CARDIA)    Difficulty of Paying Living Expenses: Hard  Food Insecurity: No Food Insecurity (10/02/2022)   Hunger Vital Sign    Worried About Running Out of Food in the Last Year: Never true    Ran Out of Food in the Last Year: Never true  Transportation Needs: No Transportation Needs (10/25/2020)   PRAPARE - Hydrologist (Medical): No    Lack of Transportation (Non-Medical): No  Physical Activity: Inactive (08/03/2020)   Exercise Vital Sign    Days of Exercise per Week: 0 days    Minutes of Exercise per Session: 0 min  Stress: Stress Concern Present (10/11/2020)   [place]    Feeling of Stress : Very much  Social Connections: Moderately Integrated (10/02/2022)   Social Connection and Isolation Panel [NHANES]    Frequency of Communication with Friends and Family: More than three times a week    Frequency of Social Gatherings with Friends and Family: More than  three times a week    Attends Religious Services: More than 4 times per year    Active Member of [employer] or Organizations: No    Attends Archivist Meetings: Never    Marital Status: Married  Human resources officer Violence: Not  At Risk (10/02/2022)   Humiliation, Afraid, Rape, and Kick questionnaire    Fear of Current or Ex-Partner: No    Emotionally Abused: No    Physically Abused: No    Sexually Abused: No    Review of Systems: ROS negative except for what is noted on the assessment and plan.  Vitals:   10/02/22 0836 10/02/22 0903  BP: (!) 142/74 (!) 140/63  Pulse: (!) 115   Temp: 98.7 F (37.1 C)   TempSrc: Oral   SpO2: 99%   Weight: 278 lb 4.8 oz (126.2 kg)   Height: _0  (1.93 m)     Physical Exam: Constitutional: well-appearing female sitting in chair, in no acute distress Cardiovascular: regular rate and rhythm, no m/r/g Pulmonary/Chest: normal work of breathing on room air, lungs clear to auscultation bilaterally MSK: normal bulk and tone  Decreased active and passive ROM of L shoulder, neg Phalens test, neg tinel's test   L hand swollen compared to R  Normal cervical ROM  Neurological: alert & oriented x 3, no focal deficit Skin: warm and dry Psych: normal mood and behavior  Assessment & Plan:    Patient discussed with Dr. Philipp Ovens  Tendinopathy of left rotator cuff The patient presents for a follow-up of her chronic left shoulder pain that is thought to be secondary to a tendinopathy of the left rotator cuff, as well as adhesive capsulitis.  Of note, the patient fell back in March and has been dealing with ongoing pain in her left shoulder since then.  She has been referred to sports medicine who has done joint injections, and she has also gone to physical therapy, without any relief from either of these.  The patient is also taking Tylenol, ibuprofen, naproxen, and Flexeril, without any relief.  She was recommended to get an MRI of her left shoulder by  sports medicine, but was unable to do this even with Ativan/Valium, due to severe claustrophobia.  She will either need an open MRI or an MRI with sedation from anesthesia.  Today, the patient states that she continues to have this pain that has not gotten any better since March.  About 1 week ago, she also started to have left hand pain and noticed that her fingers were swollen.  Her pain is mostly in her first 3 digits on the left side and she questionably has some numbness.  On exam there is visible swelling to the left digits compared to the right.  She has a negative Phalen's and Tinel's test.  Given the nature of her symptoms, I was concerned for a cervical radiculopathy, but the patient is having no pain in her cervical region and has normal range of motion of her cervical spine.  Her shoulder exam is significant for decreased active and passive range of motion of the left shoulder.  Given the ongoing suspicion for rotator cuff tendinopathy/adhesive capsulitis, and no improvement in her symptoms since March, we need to obtain the MRI.  I have placed a new order to get an MRI left shoulder, and we will try to get this scheduled as an open MRI.  If we are not able to get this scheduled as an open MRI, we may need to consider referral to anesthesia for sedation to get the MRI. Additionally, I encouraged the patient to follow-up with her sports medicine physician as well. She may need a referral to ortho in the future, but we will start with the MRI.    Buddy Duty, D.O.  [facility], PGY-2 Phone: [phone] Date 10/02/2022 Time 11:30 AM

## 2022-10-02 NOTE — Assessment & Plan Note (Addendum)
The patient presents for a follow-up of her chronic left shoulder pain that is thought to be secondary to a tendinopathy of the left rotator cuff, as well as adhesive capsulitis.  Of note, the patient fell back in March and has been dealing with ongoing pain in her left shoulder since then.  She has been referred to sports medicine who has done joint injections, and she has also gone to physical therapy, without any relief from either of these.  The patient is also taking Tylenol, ibuprofen, naproxen, and Flexeril, without any relief.  She was recommended to get an MRI of her left shoulder by sports medicine, but was unable to do this even with Ativan/Valium, due to severe claustrophobia.  She will either need an open MRI or an MRI with sedation from anesthesia.  Today, the patient states that she continues to have this pain that has not gotten any better since March.  About 1 week ago, she also started to have left hand pain and noticed that her fingers were swollen.  Her pain is mostly in her first 3 digits on the left side and she questionably has some numbness.  On exam there is visible swelling to the left digits compared to the right.  She has a negative Phalen's and Tinel's test.  Given the nature of her symptoms, I was concerned for a cervical radiculopathy, but the patient is having no pain in her cervical region and has normal range of motion of her cervical spine.  Her shoulder exam is significant for decreased active and passive range of motion of the left shoulder.  Given the ongoing suspicion for rotator cuff tendinopathy/adhesive capsulitis, and no improvement in her symptoms since March, we need to obtain the MRI.  I have placed a new order to get an MRI left shoulder, and we will try to get this scheduled as an open MRI.  If we are not able to get this scheduled as an open MRI, we may need to consider referral to anesthesia for sedation to get the MRI. Additionally, I encouraged the patient to  follow-up with her sports medicine physician as well. She may need a referral to ortho in the future, but we will start with the MRI.

## 2022-10-02 NOTE — Progress Notes (Signed)
Internal Medicine Clinic Attending ° °Case discussed with Dr. Atway  At the time of the visit.  We reviewed the resident’s history and exam and pertinent patient test results.  I agree with the assessment, diagnosis, and plan of care documented in the resident’s note.  °

## 2022-10-08 ENCOUNTER — Other Ambulatory Visit: Payer: Self-pay

## 2022-10-09 ENCOUNTER — Other Ambulatory Visit (HOSPITAL_COMMUNITY): Payer: Self-pay

## 2022-10-10 ENCOUNTER — Other Ambulatory Visit: Payer: Self-pay

## 2022-10-16 ENCOUNTER — Other Ambulatory Visit: Payer: Self-pay

## 2022-10-16 ENCOUNTER — Other Ambulatory Visit: Payer: Self-pay | Admitting: Internal Medicine

## 2022-10-16 ENCOUNTER — Ambulatory Visit (INDEPENDENT_AMBULATORY_CARE_PROVIDER_SITE_OTHER): Payer: 59 | Admitting: Internal Medicine

## 2022-10-16 ENCOUNTER — Other Ambulatory Visit (HOSPITAL_COMMUNITY): Payer: Self-pay

## 2022-10-16 ENCOUNTER — Ambulatory Visit (HOSPITAL_COMMUNITY)
Admission: RE | Admit: 2022-10-16 | Discharge: 2022-10-16 | Disposition: A | Discharge status: Home / Self Care | Payer: 59 | Source: Ambulatory Visit | Attending: Internal Medicine | Admitting: Internal Medicine

## 2022-10-16 ENCOUNTER — Encounter: Payer: Self-pay | Admitting: Student

## 2022-10-16 VITALS — BP 142/72 | HR 103 | Temp 98.2°F | Ht 76.0 in | Wt 274.8 lb

## 2022-10-16 DIAGNOSIS — Z794 Long term (current) use of insulin: Secondary | ICD-10-CM

## 2022-10-16 DIAGNOSIS — M25552 Pain in left hip: Secondary | ICD-10-CM

## 2022-10-16 DIAGNOSIS — E1169 Type 2 diabetes mellitus with other specified complication: Secondary | ICD-10-CM | POA: Diagnosis not present

## 2022-10-16 DIAGNOSIS — Z7985 Long-term (current) use of injectable non-insulin antidiabetic drugs: Secondary | ICD-10-CM

## 2022-10-16 DIAGNOSIS — Z7984 Long term (current) use of oral hypoglycemic drugs: Secondary | ICD-10-CM | POA: Diagnosis not present

## 2022-10-16 LAB — POCT GLYCOSYLATED HEMOGLOBIN (HGB A1C): Hemoglobin A1C: 8.3 % — AB (ref 4.0–5.6)

## 2022-10-16 LAB — GLUCOSE, CAPILLARY: Glucose-Capillary: 152 mg/dL — ABNORMAL HIGH (ref 70–99)

## 2022-10-16 MED ORDER — FREESTYLE LIBRE 2 SENSOR MISC: 3 refills | Status: DC

## 2022-10-16 MED ORDER — GLUCOSE BLOOD VI STRP
ORAL_STRIP | Freq: Four times a day (QID) | 0 refills | Status: DC
  Filled 2022-10-16: qty 100, 25d supply, fill #0

## 2022-10-16 MED ORDER — BLOOD GLUCOSE MONITOR KIT
PACK | Freq: Four times a day (QID) | 0 refills | Status: DC
  Filled 2022-10-16: qty 1, 1d supply, fill #0

## 2022-10-16 MED ORDER — ONETOUCH DELICA PLUS LANCET33G MISC
Freq: Four times a day (QID) | 0 refills | Status: DC
  Filled 2022-10-16: qty 100, 25d supply, fill #0

## 2022-10-16 NOTE — Patient Instructions (Addendum)
Thank you, Ms.[place] Shawntale Kleinman for allowing [place] to provide your care today.   Left hip pain Thank you for coming in today.  With your history of hip fracture and nails being in place I want to make sure that these are not the source of your pain.  I am getting an x-ray of your hip.  You can go upstairs and complete this today.  I also am referring you to Ortho.  Diabetes Your A1c was increased from 7.4-8.3.  Our goal for your A1c is this to be less than 7.  I have referred you back to [facility] for help with CGM placement.  I think she will provide more training and I am hoping that you will become more comfortable with this.  Long-term it is very important that you have a way to check your blood sugars as insulin has a risk of low blood sugars which are life-threatening.  This is how we keep you safe on this medication.  I am also sending in a glucometer.  This is in case you need to check your blood sugars if you are having symptoms concerning for low sugars.  I have ordered the following labs for you:  Lab Orders         Glucose, capillary         POC Hbg A1C      Referrals ordered today:   Referral Orders         Ambulatory Referral to DSME/T         Ambulatory referral to Orthopedic Surgery       I have ordered the following medication/changed the following medications:   Stop the following medications: Medications Discontinued During This Encounter  Medication Reason   Continuous Blood Gluc Sensor (FREESTYLE LIBRE 2 SENSOR) MISC Reorder     Start the following medications: Meds ordered this encounter  Medications   Continuous Blood Gluc Sensor (FREESTYLE LIBRE 2 SENSOR) MISC    Sig: Check blood sugar at least 6 times a day    Dispense:  6 each    Refill:  3   blood glucose meter kit and supplies KIT    Sig: Dispense based on patient and insurance preference. Use up to four times daily as directed.    Dispense:  1 each    Refill:  0    Order Specific Question:    Number of strips    Answer:   100    Order Specific Question:   Number of lancets    Answer:   100     Follow up:  4 weeks    We look forward to seeing you next time. Please call our clinic at [phone] if you have any questions or concerns. The best time to call is Monday-Friday from 9am-4pm, but there is someone available 24/7. If after hours or the weekend, call the main hospital number and ask for the Internal Medicine Resident On-Call. If you need medication refills, please notify your pharmacy one week in advance and they will send [place] a request.   Thank you for trusting me with your care. Wishing you the best!   Christiana Fuchs, [place]

## 2022-10-16 NOTE — Assessment & Plan Note (Signed)
Patient presents with 1 week history of left hip pain.  She states that she was walking and then gradually started noticing her hip hurt.  This is worsened over the last week and she now has difficulty ambulating.  The pain is sharp and present when she is sitting or standing.  It is present at the lateral part of hip and radiates into groin.  She has tried ibuprofen 800 every 6 hours over the last week as well as Flexeril.  It also did not help with pain. She denies clicking or catching of hip.  She denies fever or chills.  Differentials include misplacement of surgical screws from prior hip fracture repair, avascular necrosis, or osteoarthritis Plan: 2-3 view x-ray of left hip to evaluate for displacement of surgical screws versus avascular necrosis. Referral to Ortho

## 2022-10-16 NOTE — Progress Notes (Signed)
Subjective:  CC: Left hip pain  HPI:  [name] is a 51 y.o. female with a past medical history stated below and presents today for left hip pain for 1 week.  We also followed up on chronic medical conditions including hypertension and type 2 diabetes. Please see problem based assessment and plan for additional details.  Past Medical History:  Diagnosis Date   Diabetes mellitus    type II   Fibroids    HTN (hypertension)    Hyperlipidemia    Iron deficiency anemia    Left acetabular fracture (HCC)    Lumbar strain    Obesity    Ovarian cyst     Current Outpatient Medications on File Prior to Visit  Medication Sig Dispense Refill   amLODipine-olmesartan (AZOR) 5-20 MG tablet Take 1 tablet by mouth daily. 90 tablet 3   ARIPiprazole (ABILIFY) 30 MG tablet Take 1 tablet (30 mg total) by mouth daily. 30 tablet 3   atorvastatin (LIPITOR) 40 MG tablet Take 1 tablet (40 mg total) by mouth daily. 100 tablet 2   Blood Glucose Monitoring Suppl (CONTOUR NEXT ONE) KIT 1 each by Does not apply route See admin instructions. USE TO CHECK BLOOD GLUCOSE ONCE DAILY. IM PROGRAM. 1 kit 0   busPIRone (BUSPAR) 15 MG tablet Take 1 tablet (15 mg total) by mouth 3 (three) times daily. 90 tablet 3   cyclobenzaprine (FLEXERIL) 5 MG tablet Take 1 tablet (5 mg total) by mouth at bedtime as needed for muscle spasms. 30 tablet 1   diazepam (VALIUM) 5 MG tablet Take 1 tablet 30 minutes prior to procedure.  May repeat one time 2 tablet 0   gabapentin (NEURONTIN) 400 MG capsule Take 1 capsule (400 mg total) by mouth 3 (three) times daily. 90 capsule 3   glucose blood (CONTOUR NEXT TEST) test strip Please use to check your sugar 3 times a day. 100 strip 3   ibuprofen (ADVIL) 800 MG tablet Take 1 tablet (800 mg total) by mouth every 8 (eight) hours as needed. 30 tablet 0   Insulin Lispro Prot & Lispro (HUMALOG MIX 75/25 KWIKPEN) (75-25) 100 UNIT/ML Kwikpen Inject 24 Units into the skin in the  morning and at bedtime. 15 mL 11   Insulin Pen Needle 32G X 4 MM MISC Use daily at 6pm 100 each 4   lamoTRIgine (LAMICTAL) 100 MG tablet Take 1 tablet (100 mg total) by mouth daily. 30 tablet 2   Lancets MISC 1 Units by Does not apply route 3 (three) times daily. 100 each 3   metFORMIN (GLUCOPHAGE) 1000 MG tablet Take 1 tablet (1,000 mg total) by mouth 2 (two) times daily with a meal. 180 tablet 1   Multiple Vitamins-Minerals (MULTIVITAMIN WITH MINERALS) tablet Take 1 tablet by mouth daily.     naproxen (NAPROSYN) 500 MG tablet Take 1 tablet (500 mg total) by mouth 2 (two) times daily with a meal. 60 tablet 0   nicotine polacrilex (NICORETTE) 4 MG gum Use 1 piece of gum (4 mg total) by mouth as needed for smoking cessation. 110 tablet 3   nitroGLYCERIN (NITRO-DUR) 0.2 mg/hr patch Cut patch into one - fourth pieces. Place a one-fourth (1/4) piece of patch on skin over affected area, changing to a new piece every 24 hours. 30 patch 1   Semaglutide, 2 MG/DOSE, (OZEMPIC, 2 MG/DOSE,) 8 MG/3ML SOPN Inject 2 mg into the skin once a week. 3 mL 3   sertraline (ZOLOFT) 50 MG  tablet Take 1 tablet (50 mg total) by mouth daily. 90 tablet 3   traMADol (ULTRAM) 50 MG tablet Take 1 tablet (50 mg total) by mouth every 12 (twelve) hours as needed. 20 tablet 0   triamcinolone cream (KENALOG) 0.1 % Apply 1 application topically 2 (two) times daily. 30 g 0   zolpidem (AMBIEN) 10 MG tablet Take 1 tablet (10 mg total) by mouth at bedtime as needed for sleep. 30 tablet 2   No current facility-administered medications on file prior to visit.    Family History  Problem Relation Age of Onset   Colon cancer Sister    Diabetes Mother    Diabetes Father    Diabetes Brother    Celiac disease Paternal Aunt     Social History   Socioeconomic History   Marital status: Single    Spouse name: Not on file   Number of children: 0   Years of education: 12   Highest education level: Some college, no degree  Occupational  History   Occupation: Engineer, materials: UNEMPLOYED    Employer: [employer]  Tobacco Use   Smoking status: Every Day    Packs/day: 0.50    Years: 23.00    Total pack years: 11.50    Types: Cigarettes   Smokeless tobacco: Never   Tobacco comments:    1/2PPD  Vaping Use   Vaping Use: Never used  Substance and Sexual Activity   Alcohol use: Yes    Alcohol/week: 0.0 standard drinks of alcohol    Comment: occ   Drug use: No   Sexual activity: Yes    Partners: Female    Birth control/protection: Injection    Comment: Depo  Other Topics Concern   Not on file  Social History Narrative   Homosexual; in a monogamous relationship w/ homosexual woman.   She is disabled.  She last worked in 2018 at [employer].    Right handed   One story home   Caffeine: 1 soda daily, occas drinks coffee    Social Determinants of Health   Financial Resource Strain: High Risk (10/25/2020)   Overall Financial Resource Strain (CARDIA)    Difficulty of Paying Living Expenses: Hard  Food Insecurity: No Food Insecurity (10/16/2022)   Hunger Vital Sign    Worried About Running Out of Food in the Last Year: Never true    Ran Out of Food in the Last Year: Never true  Transportation Needs: No Transportation Needs (10/25/2020)   PRAPARE - Hydrologist (Medical): No    Lack of Transportation (Non-Medical): No  Physical Activity: Inactive (08/03/2020)   Exercise Vital Sign    Days of Exercise per Week: 0 days    Minutes of Exercise per Session: 0 min  Stress: Stress Concern Present (10/11/2020)   [place]    Feeling of Stress : Very much  Social Connections: Moderately Isolated (10/16/2022)   Social Connection and Isolation Panel [NHANES]    Frequency of Communication with Friends and Family: More than three times a week    Frequency of Social Gatherings with Friends and Family: More than three times a week     Attends Religious Services: More than 4 times per year    Active Member of [employer] or Organizations: No    Attends Archivist Meetings: Never    Marital Status: Never married  Intimate Partner Violence: Not At Risk (10/16/2022)   Humiliation,  Afraid, Rape, and Kick questionnaire    Fear of Current or Ex-Partner: No    Emotionally Abused: No    Physically Abused: No    Sexually Abused: No    Review of Systems: ROS negative except for what is noted on the assessment and plan.  Objective:   Vitals:   10/16/22 1045 10/16/22 1053  BP: (!) 143/74 (!) 142/72  Pulse: (!) 106 (!) 103  Temp: 98.2 F (36.8 C)   TempSrc: Oral   SpO2: 100%   Weight: 274 lb 12.8 oz (124.6 kg)   Height: _0  (1.93 m)     Physical Exam: Constitutional: well-appearing  Cardiovascular: regular rate and rhythm, no m/r/g Pulmonary/Chest: normal work of breathing on room air, lungs clear to auscultation bilaterally Abdominal: soft, non-tender, non-distended MSK: difficulty with transferring from wheelchair to exam table, TTP to lateral aspect of left hip over greater trochanter, pain with log roll on left and restricted internal rotation, FADIR and FABER with pain on left Skin: warm and dry   Assessment & Plan:  Acute hip pain, left Patient presents with 1 week history of left hip pain.  She states that she was walking and then gradually started noticing her hip hurt.  This is worsened over the last week and she now has difficulty ambulating.  The pain is sharp and present when she is sitting or standing.  It is present at the lateral part of hip and radiates into groin.  She has tried ibuprofen 800 every 6 hours over the last week as well as Flexeril.  It also did not help with pain. She denies clicking or catching of hip.  She denies fever or chills.  Differentials include misplacement of surgical screws from prior hip fracture repair, avascular necrosis, or osteoarthritis Plan: 2-3 view x-ray of  left hip to evaluate for displacement of surgical screws versus avascular necrosis. Referral to Ortho  Type 2 diabetes mellitus (HCC) Last A1c at 7.4 8/23 to 8.3 12/14.  Current medications include Ozempic 2 mg, metformin 10000 twice daily, NovoLog 70/30 24 units twice daily. She does not have a CGM as the adhesive reacted to her skin several months ago. Does not have gluco meter at home. She has not had any symptoms concerning for hypoglycemia. P: -glucometer sent, I talked with patient about risk of insulin and that checking her blood sugar prior to injecting is a safety measure -continue novolog 24 units BID and metformin 1000 mg BID -message sent to Butch Penny about having patient follow-up again for CGM education  Patient discussed with Dr. Ennis Forts Tina [name], D.O. [facility]  PGY-2 Pager: [phone]  Phone: [phone] Date 10/17/2022  Time 8:19 AM

## 2022-10-16 NOTE — Progress Notes (Signed)
Last visit 10/02/2022 Left shoulder pain/rotator cuff tendinopathy-MRI ordered HTN-amlodipine/olmesartan 5/20 (may be increased) T2DM-A1c 7.4, Ozempic up to 2 mg, Jardiance 10, metformin 1000 twice daily, NovoLog 70/30 24 units twice daily Bipolar 2

## 2022-10-17 ENCOUNTER — Other Ambulatory Visit (HOSPITAL_COMMUNITY): Payer: Self-pay

## 2022-10-17 NOTE — Assessment & Plan Note (Signed)
Last A1c at 7.4 8/23 to 8.3 12/14.  Current medications include Ozempic 2 mg, metformin 10000 twice daily, NovoLog 70/30 24 units twice daily. She does not have a CGM as the adhesive reacted to her skin several months ago. Does not have gluco meter at home. She has not had any symptoms concerning for hypoglycemia. P: -glucometer sent, I talked with patient about risk of insulin and that checking her blood sugar prior to injecting is a safety measure -continue novolog 24 units BID and metformin 1000 mg BID -message sent to Butch Penny about having patient follow-up again for CGM education

## 2022-10-20 ENCOUNTER — Ambulatory Visit
Admission: RE | Admit: 2022-10-20 | Discharge: 2022-10-20 | Disposition: A | Discharge status: Home / Self Care | Payer: 59 | Source: Ambulatory Visit | Attending: Student in an Organized Health Care Education/Training Program | Admitting: Student in an Organized Health Care Education/Training Program

## 2022-10-20 DIAGNOSIS — M67912 Unspecified disorder of synovium and tendon, left shoulder: Secondary | ICD-10-CM

## 2022-10-20 NOTE — Progress Notes (Signed)
Internal Medicine Clinic Attending  Case discussed with Dr. Masters  At the time of the visit.  We reviewed the resident's history and exam and pertinent patient test results.  I agree with the assessment, diagnosis, and plan of care documented in the resident's note.  

## 2022-10-24 ENCOUNTER — Other Ambulatory Visit: Payer: Self-pay

## 2022-10-24 ENCOUNTER — Other Ambulatory Visit (HOSPITAL_COMMUNITY): Payer: Self-pay

## 2022-10-28 ENCOUNTER — Other Ambulatory Visit (HOSPITAL_COMMUNITY): Payer: Self-pay

## 2022-10-29 ENCOUNTER — Other Ambulatory Visit: Payer: Self-pay | Admitting: Internal Medicine

## 2022-10-29 ENCOUNTER — Encounter: Payer: Self-pay | Admitting: Orthopaedic Surgery

## 2022-10-29 ENCOUNTER — Ambulatory Visit (INDEPENDENT_AMBULATORY_CARE_PROVIDER_SITE_OTHER): Payer: 59 | Admitting: Orthopaedic Surgery

## 2022-10-29 ENCOUNTER — Other Ambulatory Visit (HOSPITAL_COMMUNITY): Payer: Self-pay

## 2022-10-29 VITALS — Ht 75.0 in | Wt 277.6 lb

## 2022-10-29 DIAGNOSIS — M1612 Unilateral primary osteoarthritis, left hip: Secondary | ICD-10-CM | POA: Diagnosis not present

## 2022-10-29 DIAGNOSIS — E1165 Type 2 diabetes mellitus with hyperglycemia: Secondary | ICD-10-CM

## 2022-10-29 MED ORDER — TRAMADOL HCL 50 MG PO TABS
50.0000 mg | ORAL_TABLET | Freq: Two times a day (BID) | ORAL | 0 refills | Status: DC | PRN
  Filled 2022-10-29: qty 14, 7d supply, fill #0

## 2022-10-29 NOTE — Progress Notes (Signed)
Office Visit Note   Patient: Tina [name]           Date of Birth: 05-Sep-1971           MRN: [id number] Visit Date: 10/29/2022              Requested by: Velna Ochs, MD [address] [place]   [id number] PCP: [name], MD   Assessment & Plan: Visit Diagnoses:  1. Unilateral primary osteoarthritis, left hip     Plan: Impression is left hip post-traumatic arthritis.  Appears one of the screws has perforated the subchondral bone.  We discussed various treatment options to include cortisone injection, screw removal and total hip replacement.  She would like to first proceed with cortisone injection.  Will go ahead and get a CT scan to get a better look at the hardware position and degree of DJD.  Follow-up once completed.  Follow-Up Instructions: Return for after CT scan.   Orders:  Orders Placed This Encounter  Procedures   CT HIP LEFT WO CONTRAST   AMB referral to sports medicine   Meds ordered this encounter  Medications   traMADol (ULTRAM) 50 MG tablet    Sig: Take 1 tablet (50 mg total) by mouth every 12 (twelve) hours as needed.    Dispense:  30 tablet    Refill:  0      Procedures: No procedures performed   Clinical Data: No additional findings.   Subjective: Chief Complaint  Patient presents with   Left Hip - Pain    HPI patient is a pleasant 51 year old female who comes in today with left hip pain.  Symptoms of been ongoing for the past 2 weeks and have progressively worsened.  She denies any injury or change in activity.  She does note history of left hip fracture 18 years ago from rollerskating.  Symptoms are currently worse with walking as well as going from seated to standing position and occasionally while lying in the bed.  She has been taking ibuprofen without relief.  She denies any radiation of pain down her leg.  No paresthesias.  No previous hip injection.  Review of Systems as detailed in HPI.  All others reviewed  and are negative.   Objective: Vital Signs: Ht '6\' 3"'$  (1.905 m)   Wt 277 lb 9.6 oz (125.9 kg)   BMI 34.70 kg/m   Physical Exam well-developed well-nourished female no acute distress.  Alert and oriented x 3.  Ortho Exam left hip exam shows a negative logroll.  She does have pain with FADIR and [place].  Negative straight leg raise.  She is neurovascular intact distally.  Specialty Comments:  No specialty comments available.  Imaging: X-rays of the left hip reveal slight superior migration through the cortex of the inferior screw   PMFS History: Patient Active Problem List   Diagnosis Date Noted   Acute hip pain, left 10/16/2022   Olecranon bursitis of left elbow 06/25/2022   Vaginal discharge 06/25/2022   Tendinopathy of left rotator cuff 06/23/2022   Trochanteric bursitis of left hip 01/21/2022   Strain of left triceps muscle 01/21/2022   At risk for obstructive sleep apnea 01/08/2022   Snoring 01/08/2022   Insomnia due to mental condition 01/08/2022   Other chronic pain 01/08/2022   Grief reaction 01/08/2022   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   Insomnia due to other mental disorder 03/18/2021   Generalized anxiety disorder 11/12/2020   Severe recurrent major depression with  psychotic features ([place]) 10/04/2020   Bipolar 2 disorder, major depressive episode ([place]) 10/04/2020   Fibroids 04/14/2019   Seasonal allergies 02/06/2017   Osteoarthritis of right ankle 08/29/2016   Tobacco abuse 01/21/2016   Severe obesity with body mass index (BMI) of 35.0 to 39.9 with serious comorbidity ([place]) 10/12/2015   Ovarian cyst 12/24/2012   Hypertension 07/20/2012   Healthcare maintenance 06/27/2011   Type 2 diabetes mellitus ([place]) 05/30/2010   Dyslipidemia 05/30/2010   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   Past Medical History:  Diagnosis Date   Diabetes mellitus    type II   Fibroids    HTN (hypertension)    Hyperlipidemia    Iron deficiency  anemia    Left acetabular fracture (HCC)    Lumbar strain    Obesity    Ovarian cyst     Family History  Problem Relation Age of Onset   Colon cancer Sister    Diabetes Mother    Diabetes Father    Diabetes Brother    Celiac disease Paternal Aunt     Past Surgical History:  Procedure Laterality Date   FOOT ARTHRODESIS, MODIFIED MCBRIDE     right foot bunion surgery and ankle surgery 1980s   ORIF ACETABULAR FRACTURE     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

## 2022-10-30 ENCOUNTER — Other Ambulatory Visit (HOSPITAL_COMMUNITY): Payer: Self-pay

## 2022-10-30 MED ORDER — OZEMPIC (2 MG/DOSE) 8 MG/3ML ~~LOC~~ SOPN
2.0000 mg | PEN_INJECTOR | SUBCUTANEOUS | 3 refills | Status: DC
  Filled 2022-10-30: qty 3, 28d supply, fill #0
  Filled 2022-11-24: qty 3, 28d supply, fill #1
  Filled 2022-12-13 – 2022-12-15 (×2): qty 3, 28d supply, fill #2
  Filled 2023-01-12: qty 3, 28d supply, fill #3

## 2022-10-30 MED ORDER — METFORMIN HCL 1000 MG PO TABS
1000.0000 mg | ORAL_TABLET | Freq: Two times a day (BID) | ORAL | 1 refills | Status: DC
  Filled 2022-10-30: qty 180, 90d supply, fill #0
  Filled 2023-02-02 – 2023-02-17 (×2): qty 180, 90d supply, fill #1

## 2022-10-30 MED ORDER — ATORVASTATIN CALCIUM 40 MG PO TABS
40.0000 mg | ORAL_TABLET | Freq: Every day | ORAL | 2 refills | Status: DC
  Filled 2022-10-30: qty 100, 100d supply, fill #0
  Filled 2023-02-02 – 2023-02-17 (×2): qty 100, 100d supply, fill #1

## 2022-10-31 ENCOUNTER — Other Ambulatory Visit (HOSPITAL_COMMUNITY): Payer: Self-pay

## 2022-11-07 ENCOUNTER — Telehealth (HOSPITAL_COMMUNITY): Payer: 59 | Admitting: Student in an Organized Health Care Education/Training Program

## 2022-11-07 ENCOUNTER — Encounter (HOSPITAL_COMMUNITY): Payer: Self-pay

## 2022-11-07 DIAGNOSIS — F411 Generalized anxiety disorder: Secondary | ICD-10-CM

## 2022-11-07 DIAGNOSIS — F3181 Bipolar II disorder: Secondary | ICD-10-CM

## 2022-11-07 DIAGNOSIS — F5105 Insomnia due to other mental disorder: Secondary | ICD-10-CM

## 2022-11-07 NOTE — Progress Notes (Signed)
[place] MD/PA/NP OP Progress Note   Virtual Visit via Video Note  I attempted to connect with Tina [name] on 11/07/22 at  8:30 AM EST by a video enabled telemedicine application, however, she did not attend the appointment.  She will be rescheduled.    11/07/2022 8:54 AM Tina [name]  MRN:  [id number]   HPI:  Tina [name] is a 52 yr old female who presents via Virtual Video Visit for Follow Up and Medication Management.  PPHx is significant for Bipolar 2 Disorder, GAD, and Insomnia.  Tina [name] did not attend the appointment and will be rescheduled.    Visit Diagnosis:    ICD-10-CM   1. Bipolar 2 disorder, major depressive episode ([place])  F31.81     2. Generalized anxiety disorder  F41.1     3. Insomnia due to other mental disorder  F51.05    F99       Past Psychiatric History: Bipolar 2 Disorder, GAD, and Insomnia.  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  PPHx is significant for Bipolar 2 Disorder, GAD, and Insomnia.   Tina [name] did not attend the appointment and will be rescheduled.    Tina Cedar, MD 11/07/2022, 8:54  AM

## 2022-11-11 ENCOUNTER — Encounter: Payer: 59 | Admitting: Dietician

## 2022-11-13 ENCOUNTER — Ambulatory Visit (INDEPENDENT_AMBULATORY_CARE_PROVIDER_SITE_OTHER): Payer: Non-veteran care | Admitting: Podiatry

## 2022-11-13 DIAGNOSIS — E1149 Type 2 diabetes mellitus with other diabetic neurological complication: Secondary | ICD-10-CM | POA: Diagnosis not present

## 2022-11-13 DIAGNOSIS — Q828 Other specified congenital malformations of skin: Secondary | ICD-10-CM | POA: Diagnosis not present

## 2022-11-13 NOTE — Progress Notes (Signed)
Subjective: Chief Complaint  Patient presents with   Callouses    3 month f/u, bilateral feet      52 year old female presents the office today for concerns of painful calluses on both of her feet.  She tries to file them down particular in the left second toe.  They still cause discomfort.  No swelling redness or drainage and no new concerns today.   Last A1c was 8.3 on October 16, 2021.  [name], MD Last seen: October 16, 2022   Objective: AAO x3, NAD DP/PT pulses palpable bilaterally, CRT less than 3 seconds Significant bunions are present bilateral with hammertoes deformities bilaterally.  Flatfoot is present.  Hyperkeratotic lesion sulcus of the right first interspace, left submetatarsal 2 as well as fifth metatarsal head on the left foot.  Minimal callus on the medial aspect the left second toe.  There is no underlying ulceration drainage or any signs of infection.  There is no ulcerations. Mild diffuse discomfort of the ankle with mild swelling.  Most of her discomfort with ambulation.  Previous triple arthrodesis noted.  Flatfoot present. No pain with calf compression, swelling, warmth, erythema  Assessment: Right ankle arthritis, flatfoot; hyperkeratotic lesions  Plan: -All treatment options discussed with the patient including all alternatives, risks, complications.  -Sharply debrided hyperkeratotic lesions x 4 without any complications or bleeding -Continue with bracing, supportive shoe gear for the right ankle. -Daily foot inspection, glucose control. -Patient encouraged to call the office with any questions, concerns, change in symptoms.   [name] DPM

## 2022-11-14 ENCOUNTER — Other Ambulatory Visit: Payer: 59

## 2022-11-17 ENCOUNTER — Ambulatory Visit: Payer: 59 | Admitting: Sports Medicine

## 2022-11-24 ENCOUNTER — Other Ambulatory Visit (HOSPITAL_COMMUNITY): Payer: Self-pay | Admitting: Psychiatry

## 2022-11-24 DIAGNOSIS — F3181 Bipolar II disorder: Secondary | ICD-10-CM

## 2022-11-25 ENCOUNTER — Other Ambulatory Visit (HOSPITAL_COMMUNITY): Payer: Self-pay

## 2022-11-25 ENCOUNTER — Other Ambulatory Visit: Payer: Self-pay

## 2022-11-25 MED ORDER — LAMOTRIGINE 100 MG PO TABS
100.0000 mg | ORAL_TABLET | Freq: Every day | ORAL | 2 refills | Status: DC
  Filled 2022-11-25: qty 30, 30d supply, fill #0

## 2022-11-27 ENCOUNTER — Other Ambulatory Visit: Payer: Self-pay | Admitting: Dietician

## 2022-11-27 ENCOUNTER — Ambulatory Visit (INDEPENDENT_AMBULATORY_CARE_PROVIDER_SITE_OTHER): Payer: 59 | Admitting: Dietician

## 2022-11-27 DIAGNOSIS — E1169 Type 2 diabetes mellitus with other specified complication: Secondary | ICD-10-CM | POA: Diagnosis not present

## 2022-11-27 DIAGNOSIS — Z794 Long term (current) use of insulin: Secondary | ICD-10-CM

## 2022-11-27 NOTE — Progress Notes (Signed)
Diabetes Self-Management Education  Visit Type:  Annual Follow-Up  Appt. Start Time: 10:15 Appt. End Time: 10:50  11/27/2022  [name], identified by name and date of birth, is a 52 y.o. female with a diagnosis of Diabetes:  .   ASSESSMENT  Cgm- upgrade to Dexcom G7 . Count not up, because her was nor comparable , it comparable with  freestyle libre 3, installed successfully today.   Meter-can not find hers, and we will check with her later,  Weight deferred by pt today.   Diabetes Self-Management Education - 11/27/22 1100       Health Coping   How would you rate your overall health? Other (comment)   Needs to be assest in the future     Psychosocial Assessment   Patient Belief/Attitude about Diabetes --   Needs to be assest in the future   Self-care barriers Lack of material resources    Patient Concerns Monitoring    Special Needs None    Preferred Learning Style Hands on;Visual    Learning Readiness Ready      Pre-Education Assessment   Patient understands the diabetes disease and treatment process. N/A (Comment)   Patient not interested   Patient understands incorporating nutritional management into lifestyle. --   Patient not interested   Patient undertands incorporating physical activity into lifestyle. --   Patient not interested   Patient understands using medications safely. Needs Review    Patient understands monitoring blood glucose, interpreting and using results Needs Review    Patient understands prevention, detection, and treatment of acute complications. Needs Review    Patient understands prevention, detection, and treatment of chronic complications. --   Patient not interested   Patient understands how to develop strategies to address psychosocial issues. --   Patient not interested   Patient understands how to develop strategies to promote health/change behavior. --   Patient not interested     Complications   How often do you check your  blood sugar? 0 times/day (not testing)    Postprandial Blood glucose range (mg/dL) --   We do not know   Number of hypoglycemic episodes per month 0    Number of hyperglycemic episodes ( >'200mg'$ /dL): --   We do not know   Have you had a dilated eye exam in the past 12 months? Yes    Have you had a dental exam in the past 12 months? --   We do not know   Are you checking your feet? Yes    How many days per week are you checking your feet? --   We do not know     Activity / Exercise   Activity / Exercise Type --   We do not know     Patient Education   Previous Diabetes Education Yes (please comment)    Medications Reviewed patients medication for diabetes, action, purpose, timing of dose and side effects.    Monitoring Taught/evaluated CGM (comment)      Individualized Goals (developed by patient)   Medications take my medication as prescribed    Monitoring  Other (comment)   Pick up senser. Schedull return visit     Outcomes   Program Status Not Completed      Subsequent Visit   Since your last visit have you continued or begun to take your medications as prescribed? Yes    Since your last visit have you had your blood pressure checked? Yes    Is your most  recent blood pressure lower, unchanged, or higher since your last visit? Unchanged    Since your last visit have you experienced any weight changes? Loss    Weight Loss (lbs) 5    Since your last visit, are you checking your blood glucose at least once a day? No             Learning Objective:  Patient will have a greater understanding of diabetes self-management. Patient education plan is to attend individual and/or group sessions per assessed needs and concerns.   Plan:   Patient Instructions  I will ask for a prescription for FL3 sensors to be sent to your pharmacy (you should get 6 sensors).   When you get them call for an appointment to put it on.   Please take insulin before meal after checking blood sugar.    If you cannot check it then take with with your meal.   Insulin works better if timed with food rather than taken afterwards.   When you get CGM we can talk about using the trend arrow to time insulin better with meals.  Butch Penny [phone]      Expected Outcomes:  Demonstrated interest in learning. Expect positive outcomes  Education material provided: Diabetes Resources  If problems or questions, patient to contact team via:  Phone Debera Lat, [place] 11/27/2022 11:25 AM.  Future DSME appointment: - Other (comment) (When she gets his CGM senser)

## 2022-11-27 NOTE — Patient Instructions (Signed)
I will ask for a prescription for FL3 sensors to be sent to your pharmacy (you should get 6 sensors).   When you get them call for an appointment to put it on.   Please take insulin before meal after checking blood sugar.   If you cannot check it then take with with your meal.   Insulin works better if timed with food rather than taken afterwards.   When you get CGM we can talk about using the trend arrow to time insulin better with meals.  Butch Penny [phone]

## 2022-11-27 NOTE — Telephone Encounter (Signed)
Patient request 90 days supply of Freestyle libre 3 sensors

## 2022-11-28 ENCOUNTER — Other Ambulatory Visit (HOSPITAL_COMMUNITY): Payer: Self-pay

## 2022-11-28 MED ORDER — FREESTYLE LIBRE 3 SENSOR MISC
3 refills | Status: DC
  Filled 2022-11-28: qty 6, 84d supply, fill #0
  Filled 2023-03-16: qty 6, 84d supply, fill #1
  Filled 2023-05-29: qty 6, 84d supply, fill #2
  Filled 2023-08-04: qty 6, 84d supply, fill #3

## 2022-12-03 ENCOUNTER — Telehealth (INDEPENDENT_AMBULATORY_CARE_PROVIDER_SITE_OTHER): Payer: 59 | Admitting: Physician Assistant

## 2022-12-03 ENCOUNTER — Encounter (HOSPITAL_COMMUNITY): Payer: Self-pay | Admitting: Physician Assistant

## 2022-12-03 ENCOUNTER — Other Ambulatory Visit: Payer: 59

## 2022-12-03 ENCOUNTER — Other Ambulatory Visit (HOSPITAL_COMMUNITY): Payer: Self-pay

## 2022-12-03 DIAGNOSIS — F3181 Bipolar II disorder: Secondary | ICD-10-CM | POA: Diagnosis not present

## 2022-12-03 DIAGNOSIS — F411 Generalized anxiety disorder: Secondary | ICD-10-CM | POA: Diagnosis not present

## 2022-12-03 DIAGNOSIS — F17200 Nicotine dependence, unspecified, uncomplicated: Secondary | ICD-10-CM | POA: Diagnosis not present

## 2022-12-03 MED ORDER — LAMOTRIGINE 100 MG PO TABS
100.0000 mg | ORAL_TABLET | Freq: Every day | ORAL | 1 refills | Status: DC
  Filled 2022-12-03 – 2022-12-28 (×2): qty 30, 30d supply, fill #0

## 2022-12-03 MED ORDER — LYBALVI 15-10 MG PO TABS
1.0000 | ORAL_TABLET | Freq: Every evening | ORAL | 1 refills | Status: DC
  Filled 2022-12-03 – 2023-02-17 (×5): qty 30, 30d supply, fill #0

## 2022-12-03 MED ORDER — BUSPIRONE HCL 30 MG PO TABS
30.0000 mg | ORAL_TABLET | Freq: Two times a day (BID) | ORAL | 1 refills | Status: DC
  Filled 2022-12-03: qty 60, 30d supply, fill #0
  Filled 2022-12-28: qty 60, 30d supply, fill #1

## 2022-12-03 MED ORDER — GABAPENTIN 400 MG PO CAPS
400.0000 mg | ORAL_CAPSULE | Freq: Three times a day (TID) | ORAL | 1 refills | Status: DC
  Filled 2022-12-03 – 2022-12-28 (×2): qty 90, 30d supply, fill #0
  Filled 2023-02-17: qty 90, 30d supply, fill #1

## 2022-12-03 NOTE — Progress Notes (Unsigned)
BH MD/PA/NP OP Progress Note  Virtual Visit via Video Note  I connected with Tina [name] on 12/03/22 at  9:00 AM EST by a video enabled telemedicine application and verified that I am speaking with the correct person using two identifiers.  Location: Patient: Home Provider: Clinic   I discussed the limitations of evaluation and management by telemedicine and the availability of in person appointments. The patient expressed understanding and agreed to proceed.  Follow Up Instructions:   I discussed the assessment and treatment plan with the patient. The patient was provided an opportunity to ask questions and all were answered. The patient agreed with the plan and demonstrated an understanding of the instructions.   The patient was advised to call back or seek an in-person evaluation if the symptoms worsen or if the condition fails to improve as anticipated.  I provided 30 minutes of non-face-to-face time during this encounter.  Tina Mood, PA   12/03/2022 2:43 PM Tina [name]  MRN:  [id number]  Chief Complaint:  Chief Complaint  Patient presents with  . Follow-up  . Medication Management   HPI: ***  Tina [name]  Visit Diagnosis:    ICD-10-CM   1. Bipolar 2 disorder, major depressive episode (HCC)  F31.81 OLANZapine-Samidorphan (LYBALVI) 15-10 MG TABS    lamoTRIgine (LAMICTAL) 100 MG tablet    gabapentin (NEURONTIN) 400 MG capsule    2. Generalized anxiety disorder  F41.1 busPIRone (BUSPAR) 30 MG tablet    gabapentin (NEURONTIN) 400 MG capsule    3. Tobacco use disorder  F17.200       Past Psychiatric History:  Insomnia Tobacco use Anxiety Depression Biplor 2 disorder  Past Medical History:  Past Medical History:  Diagnosis Date  . Diabetes mellitus    type II  . Fibroids   . HTN (hypertension)   . Hyperlipidemia   . Iron deficiency anemia   . Left acetabular fracture ([place])   . Lumbar strain   . Obesity    . Ovarian cyst     Past Surgical History:  Procedure Laterality Date  . FOOT ARTHRODESIS, MODIFIED MCBRIDE     right foot bunion surgery and ankle surgery 1980s  . 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 Colon cancer Sister   . Diabetes Mother   . Diabetes Father   . Diabetes Brother   . Celiac disease Paternal Aunt     Social History:  Social History   Socioeconomic History  . Marital status: Single    Spouse name: Not on file  . Number of children: 0  . Years of education: 80  . Highest education level: Some college, no degree  Occupational History  . Occupation: Engineer, materials: UNEMPLOYED    Employer: [employer], [employer]  Tobacco Use  . Smoking status: Every Day    Packs/day: 0.50    Years: 23.00    Total pack years: 11.50    Types: Cigarettes  . Smokeless tobacco: Never  . Tobacco comments:    1/2PPD  Vaping Use  . Vaping Use: Never used  Substance and Sexual Activity  . Alcohol use: Yes    Alcohol/week: 0.0 standard drinks of alcohol    Comment: occ  . Drug use: No  . Sexual activity: Yes    Partners: Female  Birth control/protection: Injection    Comment: Depo  Other Topics Concern  . Not on file  Social History Narrative   Homosexual; in a monogamous relationship w/ homosexual woman.   She is disabled.  She last worked in 2018 at [employer].    Right handed   One story home   Caffeine: 1 soda daily, occas drinks coffee    Social Determinants of Health   Financial Resource Strain: High Risk (10/25/2020)   Overall Financial Resource Strain (CARDIA)   . Difficulty of Paying Living Expenses: Hard  Food Insecurity: No Food Insecurity (10/16/2022)   Hunger Vital Sign   . Worried About Charity fundraiser in the Last Year: Never true   . Ran Out of Food in the Last Year: Never true  Transportation Needs:  No Transportation Needs (10/25/2020)   PRAPARE - Transportation   . Lack of Transportation (Medical): No   . Lack of Transportation (Non-Medical): No  Physical Activity: Inactive (08/03/2020)   Exercise Vital Sign   . Days of Exercise per Week: 0 days   . Minutes of Exercise per Session: 0 min  Stress: Stress Concern Present (10/11/2020)   [place]   . Feeling of Stress : Very much  Social Connections: Moderately Isolated (10/16/2022)   Social Connection and Isolation Panel [NHANES]   . Frequency of Communication with Friends and Family: More than three times a week   . Frequency of Social Gatherings with Friends and Family: More than three times a week   . Attends Religious Services: More than 4 times per year   . Active Member of Clubs or Organizations: No   . Attends Archivist Meetings: Never   . Marital Status: Never married    Allergies:  Allergies  Allergen Reactions  . Codeine Other (See Comments)    hallucinations  . Sglt2 Inhibitors     Recurrent yeast infections on Jardiance, stopped 01/5464    Metabolic Disorder Labs: Lab Results  Component Value Date   HGBA1C 8.3 (A) 10/16/2022   MPG 200 05/25/2017   MPG 237 02/26/2016   No results found for: "PROLACTIN" Lab Results  Component Value Date   CHOL 164 03/11/2022   TRIG 195 (H) 03/11/2022   HDL 33 (L) 03/11/2022   CHOLHDL 5.0 (H) 03/11/2022   VLDL 50 (H) 08/21/2014   LDLCALC 97 03/11/2022   LDLCALC 94 08/08/2020   Lab Results  Component Value Date   TSH 1.074 06/27/2011    Therapeutic Level Labs: No results found for: "LITHIUM" No results found for: "VALPROATE" No results found for: "CBMZ"  Current Medications: Current Outpatient Medications  Medication Sig Dispense Refill  . OLANZapine-Samidorphan (LYBALVI) 15-10 MG TABS Take 15 mg by mouth at bedtime. 30 tablet 1  . amLODipine-olmesartan (AZOR) 5-20 MG tablet Take 1  tablet by mouth daily. 90 tablet 3  . atorvastatin (LIPITOR) 40 MG tablet Take 1 tablet (40 mg total) by mouth daily. 100 tablet 2  . blood glucose meter kit and supplies KIT Use up to 4 (four) times daily as directed 1 each 0  . Blood Glucose Monitoring Suppl (CONTOUR NEXT ONE) KIT 1 each by Does not apply route See admin instructions. USE TO CHECK BLOOD GLUCOSE ONCE DAILY. IM PROGRAM. 1 kit 0  . busPIRone (BUSPAR) 30 MG tablet Take 1 tablet (30 mg total) by mouth 2 (two) times daily. 60 tablet 1  . Continuous Blood Gluc Sensor (FREESTYLE LIBRE 3  SENSOR) MISC Place 1 sensor on the skin every 14 days. Use to check glucose continuously 6 each 3  . cyclobenzaprine (FLEXERIL) 5 MG tablet Take 1 tablet (5 mg total) by mouth at bedtime as needed for muscle spasms. 30 tablet 1  . diazepam (VALIUM) 5 MG tablet Take 1 tablet 30 minutes prior to procedure.  May repeat one time 2 tablet 0  . gabapentin (NEURONTIN) 400 MG capsule Take 1 capsule (400 mg total) by mouth 3 (three) times daily. 90 capsule 1  . glucose blood (CONTOUR NEXT TEST) test strip Please use to check your sugar 3 times a day. 100 strip 3  . glucose blood test strip Use up to 4 (four) times daily as directed 100 each 0  . ibuprofen (ADVIL) 800 MG tablet Take 1 tablet (800 mg total) by mouth every 8 (eight) hours as needed. 30 tablet 0  . Insulin Lispro Prot & Lispro (HUMALOG MIX 75/25 KWIKPEN) (75-25) 100 UNIT/ML Kwikpen Inject 24 Units into the skin in the morning and at bedtime. 15 mL 11  . Insulin Pen Needle 32G X 4 MM MISC Use daily at 6pm 100 each 4  . lamoTRIgine (LAMICTAL) 100 MG tablet Take 1 tablet (100 mg total) by mouth daily. 30 tablet 1  . Lancets (ONETOUCH DELICA PLUS EPPIRJ18A) MISC Use up to 4 (four) times daily as directed 100 each 0  . Lancets MISC 1 Units by Does not apply route 3 (three) times daily. 100 each 3  . metFORMIN (GLUCOPHAGE) 1000 MG tablet Take 1 tablet (1,000 mg total) by mouth 2 (two) times daily with a  meal. 180 tablet 1  . Multiple Vitamins-Minerals (MULTIVITAMIN WITH MINERALS) tablet Take 1 tablet by mouth daily.    . naproxen (NAPROSYN) 500 MG tablet Take 1 tablet (500 mg total) by mouth 2 (two) times daily with a meal. 60 tablet 0  . nicotine polacrilex (NICORETTE) 4 MG gum Use 1 piece of gum (4 mg total) by mouth as needed for smoking cessation. 110 tablet 3  . nitroGLYCERIN (NITRO-DUR) 0.2 mg/hr patch Cut patch into one - fourth pieces. Place a one-fourth (1/4) piece of patch on skin over affected area, changing to a new piece every 24 hours. 30 patch 1  . Semaglutide, 2 MG/DOSE, (OZEMPIC, 2 MG/DOSE,) 8 MG/3ML SOPN Inject 2 mg into the skin once a week. 3 mL 3  . sertraline (ZOLOFT) 50 MG tablet Take 1 tablet (50 mg total) by mouth daily. 90 tablet 3  . traMADol (ULTRAM) 50 MG tablet Take 1 tablet (50 mg total) by mouth every 12 (twelve) hours as needed. 20 tablet 0  . traMADol (ULTRAM) 50 MG tablet Take 1 tablet (50 mg total) by mouth every 12 (twelve) hours as needed. 30 tablet 0  . triamcinolone cream (KENALOG) 0.1 % Apply 1 application topically 2 (two) times daily. 30 g 0  . zolpidem (AMBIEN) 10 MG tablet Take 1 tablet (10 mg total) by mouth at bedtime as needed for sleep. 30 tablet 2   No current facility-administered medications for this visit.     Musculoskeletal: Strength & Muscle Tone: Unable to assess due to telemedicine visit [place]: Unable to assess due to telemedicine visit Patient leans: Unable to assess due to telemedicine visit  Psychiatric Specialty Exam: Review of Systems  Psychiatric/Behavioral:  Positive for hallucinations and sleep disturbance. Negative for self-injury and suicidal ideas. The patient is nervous/anxious. The patient is not hyperactive.     There were  no vitals taken for this visit.There is no height or weight on file to calculate BMI.  General Appearance: Casual  Eye Contact:  Good  Speech:  Clear and Coherent and Normal Rate  Volume:   Normal  [name]:  Anxious and Depressed  Affect:  Congruent  Thought Process:  Coherent, Goal Directed, and Descriptions of Associations: Intact  Orientation:  Full (Time, Place, and Person)  Thought Content: WDL   Suicidal Thoughts:  No  Homicidal Thoughts:  No  Memory:  Immediate;   Good Recent;   Good Remote;   Good  Judgement:  Good  Insight:  Good  Psychomotor Activity:  Normal  Concentration:  Concentration: Good and Attention Span: Good  Recall:  Good  Fund of Knowledge: Good  Language: Good  Akathisia:  No  Handed:  Right  AIMS (if indicated): not done  Assets:  Communication Skills Desire for Improvement Financial Resources/Insurance Housing Social Support  ADL's:  Intact  Cognition: WNL  Sleep:  Poor   Screenings: GAD-7    Flowsheet Row Video Visit from 12/03/2022 in [facility] Video Visit from 08/07/2022 in [facility] Video Visit from 05/02/2022 in [facility] Video Visit from 12/27/2021 in [facility] Video Visit from 10/11/2021 in [facility]  Total GAD-7 Score '18 19 16 19 15      '$ PHQ2-9    Flowsheet Row Video Visit from 12/03/2022 in [facility] Office Visit from 10/16/2022 in [place] Video Visit from 08/07/2022 in [facility] Video Visit from 05/02/2022 in [facility] Office Visit from 02/13/2022 in [place]  PHQ-2 Total Score '6 3 5 4 '$ 0  PHQ-9 Total Score '19 17 20 16 '$ 0      Flowsheet Row Video Visit from 12/03/2022 in [facility] ED from 08/17/2022 in [facility] Emergency Department at [facility] Video Visit from 12/27/2021 in [place] No Risk No Risk Error: Q7 should not be populated when  Q6 is No        Assessment and Plan: ***    Collaboration of Care: Collaboration of Care: Medication Management AEB provider managing patient's psychiatric medications, Primary Care Provider AEB patient being seen by primary care provider at [place] patient being seen by a mental health provider at this facility, Other provider involved in patient's care AEB patient being seen by Podiatry, and Referral or follow-up with counselor/therapist AEB patient being seen by a licensed clinical social worker at this facility  Patient/Guardian was advised Release of Information must be obtained prior to any record release in order to collaborate their care with an outside provider. Patient/Guardian was advised if they have not already done so to contact the registration department to sign all necessary forms in order for [place] to release information regarding their care.   Consent: Patient/Guardian gives verbal consent for treatment and assignment of benefits for services provided during this visit. Patient/Guardian expressed understanding and agreed to proceed.   1. Generalized anxiety disorder  - busPIRone (BUSPAR) 30 MG tablet; Take 1 tablet (30 mg total) by mouth 2 (two) times daily.  Dispense: 60 tablet; Refill: 1 - gabapentin (NEURONTIN) 400 MG capsule; Take 1 capsule (400 mg total) by mouth 3 (three) times daily.  Dispense: 90 capsule; Refill:  1  2. Bipolar 2 disorder, major depressive episode (HCC)  - OLANZapine-Samidorphan (LYBALVI) 15-10 MG TABS; Take 15 mg by mouth at bedtime.  Dispense: 30 tablet; Refill: 1 - lamoTRIgine (LAMICTAL) 100 MG tablet; Take 1 tablet (100 mg total) by mouth daily.  Dispense: 30 tablet; Refill: 1 - gabapentin (NEURONTIN) 400 MG capsule; Take 1 capsule (400 mg total) by mouth 3 (three) times daily.  Dispense: 90 capsule; Refill: 1  3. Tobacco use disorder  Patient to follow-up in 6 weeks Provider spent a total of 30  minutes with the patient/reviewing patient's chart  Tina Mood, PA 12/03/2022, 2:43 PM

## 2022-12-04 ENCOUNTER — Other Ambulatory Visit (HOSPITAL_COMMUNITY): Payer: Self-pay

## 2022-12-09 ENCOUNTER — Ambulatory Visit (INDEPENDENT_AMBULATORY_CARE_PROVIDER_SITE_OTHER): Payer: 59 | Admitting: *Deleted

## 2022-12-09 DIAGNOSIS — Z3202 Encounter for pregnancy test, result negative: Secondary | ICD-10-CM | POA: Diagnosis not present

## 2022-12-09 DIAGNOSIS — Z3042 Encounter for surveillance of injectable contraceptive: Secondary | ICD-10-CM | POA: Diagnosis not present

## 2022-12-09 DIAGNOSIS — Z309 Encounter for contraceptive management, unspecified: Secondary | ICD-10-CM

## 2022-12-09 LAB — POCT URINE PREGNANCY: Preg Test, Ur: NEGATIVE

## 2022-12-09 MED ORDER — MEDROXYPROGESTERONE ACETATE 104 MG/0.65ML ~~LOC~~ SUSY
104.0000 mg | PREFILLED_SYRINGE | Freq: Once | SUBCUTANEOUS | Status: AC
  Administered 2022-12-09: 104 mg via SUBCUTANEOUS

## 2022-12-09 NOTE — Progress Notes (Signed)
    Patient presents for Depo Provera injection States feeling well Last injection received 08/20/22  Patient is overdue for injection. Urine pregnancy negative  Depo Provera given SQ left thigh. Patient tolerated well.  Patient states it has been "a long time" since last had unprotected sex Patient aware to use second method of birth control such as condoms and spermicide for next 7 days  Next injection due May 1-15, 2024  Reminder card given  L. Chesley Veasey, BSN, RN-BC

## 2022-12-13 ENCOUNTER — Other Ambulatory Visit (HOSPITAL_COMMUNITY): Payer: Self-pay

## 2022-12-15 ENCOUNTER — Other Ambulatory Visit (HOSPITAL_COMMUNITY): Payer: Self-pay

## 2022-12-15 ENCOUNTER — Other Ambulatory Visit: Payer: Self-pay

## 2022-12-18 ENCOUNTER — Encounter (HOSPITAL_COMMUNITY): Payer: Self-pay

## 2022-12-18 ENCOUNTER — Ambulatory Visit (INDEPENDENT_AMBULATORY_CARE_PROVIDER_SITE_OTHER): Payer: 59 | Admitting: Clinical

## 2022-12-18 DIAGNOSIS — F3181 Bipolar II disorder: Secondary | ICD-10-CM | POA: Diagnosis not present

## 2022-12-18 NOTE — Progress Notes (Signed)
THERAPIST PROGRESS NOTE Virtual Visit via Video Note  I connected with Tina [name] on 12/18/22 at  8:00 AM EST by a video enabled telemedicine application and verified that I am speaking with the correct person using two identifiers.  Location: Patient: home Provider: office   I discussed the limitations of evaluation and management by telemedicine and the availability of in person appointments. The patient expressed understanding and agreed to proceed.   Follow Up Instructions: I discussed the assessment and treatment plan with the patient. The patient was provided an opportunity to ask questions and all were answered. The patient agreed with the plan and demonstrated an understanding of the instructions.   The patient was advised to call back or seek an in-person evaluation if the symptoms worsen or if the condition fails to improve as anticipated.   Session Time: 16 minutes  Participation Level: Active  Behavioral Response: CasualAlertDepressed  Type of Therapy: Individual Therapy  Treatment Goals addressed: client will score less than a 10 on the PHQ-9 questionnaire   ProgressTowards Goals: Not Progressing  Interventions: CBT and Supportive  Summary:  Tina [name] is a 52 y.o. female who presents for the scheduled appointment oriented x 5, appropriately dressed, and friendly.  Client reported auditory hallucinations and denied delusions. Client reported today she has been doing the same. Client reported not much is changed with her depressive and anxiety symptoms.  Client reported she has been thinking a lot about her mother lately and feeling down.  Client reported she has not spoke to her friend in approximately 3 weeks but she knows that because she is probably going through something and she will reach out soon.  Client reported she did go to change her mother's flowers out recently by her grave site which was nice for her to do. Client  reported she did not want to talk about her mother and grief today. Client reported she has been compliant with her psychiatric medications but one of them that she cannot recall is not being covered by her insurance at the pharmacy which she was notified about.  Client inquired about having the psychiatrist notified about the discrepancy.  Client reported one of her brothers has mentioned to her about planning a beach trip.  Client reported that is something that she will do once they can figure out his work schedule. Client reported she is still hearing voices. Client reported she tries to block them out by distracting herself but it stays persistent.  Evidence of progress towards goal:  client reported she tries 1 skill of doing behavioral activation to distract her focus from auditory hallucinations.  Suicidal/Homicidal: Nowithout intent/plan  Therapist Response:  Therapist began the appointment asking the client how she has been doing since last seen. Therapist used CBT to engage using active listening and positive emotional support. Therapist used CBT to engage asking the client open ended questions about the progression of her depressive symptoms compared to medication compliance. Therapist used CBT to engage asking the client about her symptoms of grief and how she works with it. Therapist used CBT to positive reinforce the clients positive coping skills and encouraging her to stay engaged with family/ social support. Therapist used CBT ask the client to identify her progress with frequency of use with coping skills with continued practice in her daily activity.    Therapist assigned the client homework to practice self care.    Plan: Return again in 3 weeks.  Diagnosis: bipolar 2 disorder, major  depressive episode  Collaboration of Care: Medication Management AEB client inquired to inform the psychiatrist about inability to obtain her medications.  Patient/Guardian was advised Release  of Information must be obtained prior to any record release in order to collaborate their care with an outside provider. Patient/Guardian was advised if they have not already done so to contact the registration department to sign all necessary forms in order for [place] to release information regarding their care.   Consent: Patient/Guardian gives verbal consent for treatment and assignment of benefits for services provided during this visit. Patient/Guardian expressed understanding and agreed to proceed.   [place], LCSW 12/18/2022

## 2022-12-18 NOTE — Progress Notes (Signed)
Internal Medicine Clinic Attending  Reviewed documentation and agree, was available for assistance as needed.

## 2022-12-29 ENCOUNTER — Other Ambulatory Visit (HOSPITAL_COMMUNITY): Payer: Self-pay

## 2022-12-29 ENCOUNTER — Other Ambulatory Visit: Payer: Self-pay

## 2023-01-07 ENCOUNTER — Other Ambulatory Visit (HOSPITAL_COMMUNITY): Payer: Self-pay

## 2023-01-14 ENCOUNTER — Encounter (HOSPITAL_COMMUNITY): Payer: Self-pay | Admitting: Physician Assistant

## 2023-01-14 ENCOUNTER — Telehealth (INDEPENDENT_AMBULATORY_CARE_PROVIDER_SITE_OTHER): Payer: 59 | Admitting: Physician Assistant

## 2023-01-14 ENCOUNTER — Other Ambulatory Visit (HOSPITAL_COMMUNITY): Payer: Self-pay

## 2023-01-14 DIAGNOSIS — F411 Generalized anxiety disorder: Secondary | ICD-10-CM

## 2023-01-14 DIAGNOSIS — F3181 Bipolar II disorder: Secondary | ICD-10-CM

## 2023-01-14 MED ORDER — BUSPIRONE HCL 30 MG PO TABS
30.0000 mg | ORAL_TABLET | Freq: Two times a day (BID) | ORAL | 1 refills | Status: DC
  Filled 2023-01-14: qty 60, 30d supply, fill #0
  Filled 2023-02-09: qty 60, 30d supply, fill #1

## 2023-01-14 MED ORDER — LAMOTRIGINE 100 MG PO TABS
100.0000 mg | ORAL_TABLET | Freq: Every day | ORAL | 1 refills | Status: DC
  Filled 2023-01-14: qty 30, 30d supply, fill #0
  Filled 2023-02-09: qty 30, 30d supply, fill #1

## 2023-01-14 NOTE — Progress Notes (Unsigned)
[place] MD/PA/NP OP Progress Note  Virtual Visit via Video Note  I connected with Tina [name] on 01/14/23 at 11:00 AM EDT by a video enabled telemedicine application and verified that I am speaking with the correct person using two identifiers.  Location: Patient: Home Provider: Clinic   I discussed the limitations of evaluation and management by telemedicine and the availability of in person appointments. The patient expressed understanding and agreed to proceed.  Follow Up Instructions:   I discussed the assessment and treatment plan with the patient. The patient was provided an opportunity to ask questions and all were answered. The patient agreed with the plan and demonstrated an understanding of the instructions.   The patient was advised to call back or seek an in-person evaluation if the symptoms worsen or if the condition fails to improve as anticipated.  I provided 20 minutes of non-face-to-face time during this encounter.  Malachy Mood, PA   01/14/2023 3:54 PM Aubra Walch  MRN:  [id number]  Chief Complaint:  Chief Complaint  Patient presents with   Follow-up   Medication Management   HPI:   Tina [name] is a 52 year old, African-American female with a past psychiatric history significant for bipolar 2 disorder emphases major depressive episode), generalized anxiety disorder, insomnia, and tobacco use disorder who presents to [facility] via virtual video visit for follow-up and medication management.  Patient is currently being managed on the following psychiatric medications:  Lybalvi 15 mg / 10 mg at bedtime Gabapentin 400 mg 3 times daily Buspirone 15 mg 3 times daily  Patient reports that she has not been able to pick up her prescription of Lybalvi because her insurance will not cover the medication.  Patient has been on the following psychiatric medications in the past: Lamotrigine,  Abilify, and sertraline.  Due to patient being on an antipsychotic in the past, patient should be able to be prescribed for Lybalvi.  It is possible that patient needs a prior authorization before being placed on medication.  Since not having been prescribed Lybalvi, patient reports that she continues to experience anxiety and depression.  Patient also reports that she is hearing voices between every day and every other day.  Patient states that the voices are negative in nature and they tell her to harm herself.  Patient denies any issues or concerns with her other medications.  A PHQ-9 screen was performed with the patient scoring a 16.  A GAD-7 screen was also performed with the patient scoring a 17.  Patient is alert and oriented x 4, calm, cooperative, and fully engaged in conversation during the encounter.  Patient reports that her mood is okay and she is relaxing.  Patient denies suicidal or homicidal ideations.  She further denies auditory or visual hallucinations at this time and does not appear to be responding to internal/external stimuli.  Patient endorses poor sleep and receives on average 1 to 2 hours of sleep per night.  Patient endorses fair appetite and eats on average 1-2 meals per day.  Patient denies alcohol consumption and illicit drug use.  Patient endorses tobacco use and smokes on average 1/2 pack/day.  Visit Diagnosis:  No diagnosis found.   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    Family Psychiatric History:  Niece - Schizophrenia  Family History:  Family History  Problem Relation Age of Onset   Colon cancer Sister    Diabetes Mother    Diabetes Father    Diabetes Brother    Celiac disease Paternal Aunt     Social History:  Social History   Socioeconomic History   Marital status: Single    Spouse name: Not on file   Number of children: 0   Years of education: 12   Highest education level: Some college, no degree  Occupational History   Occupation: Engineer, materials: UNEMPLOYED    Employer: [employer], [employer]  Tobacco Use   Smoking status: Every Day    Packs/day: 0.50    Years: 23.00    Total pack years: 11.50    Types: Cigarettes   Smokeless tobacco: Never   Tobacco comments:    1/2PPD  Vaping Use   Vaping Use: Never used  Substance and Sexual Activity   Alcohol use: Yes    Alcohol/week: 0.0 standard drinks of alcohol    Comment: occ   Drug use: No   Sexual activity: Yes    Partners: Female    Birth control/protection: Injection    Comment: Depo  Other Topics Concern   Not on file  Social History Narrative   Homosexual; in a monogamous relationship w/ homosexual woman.   She is disabled.  She last worked in 2018 at [employer].    Right handed   One story home   Caffeine: 1 soda daily, occas drinks coffee    Social Determinants of Health   Financial Resource Strain: High Risk (10/25/2020)   Overall Financial Resource Strain (CARDIA)    Difficulty of Paying Living Expenses: Hard  Food Insecurity: No Food Insecurity (10/16/2022)   Hunger Vital Sign    Worried About Running Out of Food in the Last Year: Never true    Ran Out of Food in the Last Year: Never true  Transportation Needs: No Transportation Needs (10/25/2020)   PRAPARE - Hydrologist (Medical): No    Lack of Transportation (Non-Medical): No  Physical Activity: Inactive (08/03/2020)   Exercise Vital Sign    Days of Exercise per  Week: 0 days    Minutes of Exercise per Session: 0 min  Stress: Stress Concern Present (10/11/2020)   [place]    Feeling of Stress : Very much  Social Connections: Moderately Isolated (10/16/2022)   Social Connection and Isolation Panel [NHANES]    Frequency of Communication with Friends and Family: More than three times a week    Frequency of Social Gatherings with Friends and Family: More than three times a week    Attends Religious Services: More than 4 times per year    Active Member of [employer] or Organizations: No    Attends Archivist Meetings: Never    Marital Status: Never married    Allergies:  Allergies  Allergen Reactions   Codeine Other (See Comments)  hallucinations   Sglt2 Inhibitors     Recurrent yeast infections on Jardiance, stopped [id number]    Metabolic Disorder Labs: Lab Results  Component Value Date   HGBA1C 8.3 (A) 10/16/2022   MPG 200 05/25/2017   MPG 237 02/26/2016   No results found for: "PROLACTIN" Lab Results  Component Value Date   CHOL 164 03/11/2022   TRIG 195 (H) 03/11/2022   HDL 33 (L) 03/11/2022   CHOLHDL 5.0 (H) 03/11/2022   VLDL 50 (H) 08/21/2014   LDLCALC 97 03/11/2022   LDLCALC 94 08/08/2020   Lab Results  Component Value Date   TSH 1.074 06/27/2011    Therapeutic Level Labs: No results found for: "LITHIUM" No results found for: "VALPROATE" No results found for: "CBMZ"  Current Medications: Current Outpatient Medications  Medication Sig Dispense Refill   amLODipine-olmesartan (AZOR) 5-20 MG tablet Take 1 tablet by mouth daily. 90 tablet 3   atorvastatin (LIPITOR) 40 MG tablet Take 1 tablet (40 mg total) by mouth daily. 100 tablet 2   blood glucose meter kit and supplies KIT Use up to 4 (four) times daily as directed 1 each 0   Blood Glucose Monitoring Suppl (CONTOUR NEXT ONE) KIT 1 each by Does not apply route See admin instructions. USE TO CHECK  BLOOD GLUCOSE ONCE DAILY. IM PROGRAM. 1 kit 0   busPIRone (BUSPAR) 30 MG tablet Take 1 tablet (30 mg total) by mouth 2 (two) times daily. 60 tablet 1   Continuous Blood Gluc Sensor (FREESTYLE LIBRE 3 SENSOR) MISC Place 1 sensor on the skin every 14 days. Use to check glucose continuously 6 each 3   cyclobenzaprine (FLEXERIL) 5 MG tablet Take 1 tablet (5 mg total) by mouth at bedtime as needed for muscle spasms. 30 tablet 1   diazepam (VALIUM) 5 MG tablet Take 1 tablet 30 minutes prior to procedure.  May repeat one time 2 tablet 0   gabapentin (NEURONTIN) 400 MG capsule Take 1 capsule (400 mg total) by mouth 3 (three) times daily. 90 capsule 1   glucose blood (CONTOUR NEXT TEST) test strip Please use to check your sugar 3 times a day. 100 strip 3   glucose blood test strip Use up to 4 (four) times daily as directed 100 each 0   ibuprofen (ADVIL) 800 MG tablet Take 1 tablet (800 mg total) by mouth every 8 (eight) hours as needed. 30 tablet 0   Insulin Lispro Prot & Lispro (HUMALOG MIX 75/25 KWIKPEN) (75-25) 100 UNIT/ML Kwikpen Inject 24 Units into the skin in the morning and at bedtime. 15 mL 11   Insulin Pen Needle 32G X 4 MM MISC Use daily at 6pm 100 each 4   lamoTRIgine (LAMICTAL) 100 MG tablet Take 1 tablet (100 mg total) by mouth daily. 30 tablet 1   Lancets (ONETOUCH DELICA PLUS 123XX123) MISC Use up to 4 (four) times daily as directed 100 each 0   Lancets MISC 1 Units by Does not apply route 3 (three) times daily. 100 each 3   metFORMIN (GLUCOPHAGE) 1000 MG tablet Take 1 tablet (1,000 mg total) by mouth 2 (two) times daily with a meal. 180 tablet 1   Multiple Vitamins-Minerals (MULTIVITAMIN WITH MINERALS) tablet Take 1 tablet by mouth daily.     naproxen (NAPROSYN) 500 MG tablet Take 1 tablet (500 mg total) by mouth 2 (two) times daily with a meal. 60 tablet 0   nicotine polacrilex (NICORETTE) 4 MG gum Use 1 piece of gum (4 mg total)  by mouth as needed for smoking cessation. 110 tablet 3    nitroGLYCERIN (NITRO-DUR) 0.2 mg/hr patch Cut patch into one - fourth pieces. Place a one-fourth (1/4) piece of patch on skin over affected area, changing to a new piece every 24 hours. 30 patch 1   OLANZapine-Samidorphan (LYBALVI) 15-10 MG TABS Take 1 tablet by mouth at bedtime. 30 tablet 1   Semaglutide, 2 MG/DOSE, (OZEMPIC, 2 MG/DOSE,) 8 MG/3ML SOPN Inject 2 mg into the skin once a week. 3 mL 3   sertraline (ZOLOFT) 50 MG tablet Take 1 tablet (50 mg total) by mouth daily. 90 tablet 3   traMADol (ULTRAM) 50 MG tablet Take 1 tablet (50 mg total) by mouth every 12 (twelve) hours as needed. 20 tablet 0   traMADol (ULTRAM) 50 MG tablet Take 1 tablet (50 mg total) by mouth every 12 (twelve) hours as needed. 30 tablet 0   triamcinolone cream (KENALOG) 0.1 % Apply 1 application topically 2 (two) times daily. 30 g 0   zolpidem (AMBIEN) 10 MG tablet Take 1 tablet (10 mg total) by mouth at bedtime as needed for sleep. 30 tablet 2   No current facility-administered medications for this visit.     Musculoskeletal: Strength & Muscle Tone: Unable to assess due to telemedicine visit [place]: Unable to assess due to telemedicine visit Patient leans: Unable to assess due to telemedicine visit  Psychiatric Specialty Exam: Review of Systems  Psychiatric/Behavioral:  Positive for hallucinations and sleep disturbance. Negative for self-injury and suicidal ideas. The patient is nervous/anxious. The patient is not hyperactive.     There were no vitals taken for this visit.There is no height or weight on file to calculate BMI.  General Appearance: Casual  Eye Contact:  Good  Speech:  Clear and Coherent and Normal Rate  Volume:  Normal  Mood:  Anxious and Depressed  Affect:  Congruent  Thought Process:  Coherent, Goal Directed, and Descriptions of Associations: Intact  Orientation:  Full (Time, Place, and Person)  Thought Content: WDL   Suicidal Thoughts:  No  Homicidal Thoughts:  No  Memory:   Immediate;   Good Recent;   Good Remote;   Good  Judgement:  Good  Insight:  Good  Psychomotor Activity:  Normal  Concentration:  Concentration: Good and Attention Span: Good  Recall:  Good  Fund of Knowledge: Good  Language: Good  Akathisia:  No  Handed:  Right  AIMS (if indicated): not done  Assets:  Communication Skills Desire for Improvement Financial Resources/Insurance Housing Social Support  ADL's:  Intact  Cognition: WNL  Sleep:  Poor   Screenings: GAD-7    Flowsheet Row Video Visit from 01/14/2023 in [facility] Video Visit from 12/03/2022 in [facility] Video Visit from 08/07/2022 in [facility] Video Visit from 05/02/2022 in [facility] Video Visit from 12/27/2021 in [facility]  Total GAD-7 Score '17 18 19 16 19      '$ PHQ2-9    Flowsheet Row Video Visit from 01/14/2023 in [facility] Video Visit from 12/03/2022 in [facility] Office Visit from 10/16/2022 in [place] Video Visit from 08/07/2022 in [facility] Video Visit from 05/02/2022 in [facility]  PHQ-2 Total Score '5 6 3 5 4  '$ PHQ-9 Total Score '16 19 17 20 '$ 16  Flowsheet Row Video Visit from 01/14/2023 in [facility] Video Visit from 12/03/2022 in [facility] ED from 08/17/2022 in [facility] Emergency Department at [place] No Risk No Risk No Risk        Assessment and Plan:   Tina [name] is a 52 year old, African-American female with a past psychiatric history significant for bipolar 2 disorder emphases major depressive episode), generalized anxiety disorder, insomnia, and tobacco use disorder who presents to [facility] via virtual video visit for follow-up and medication management.  Patient reports that she has not been able to pick up her Lybalvi due to her insurance preventing her from obtaining the medication.  Patient has been on Abilify in the past and should be able to be prescribed Lybalvi.  Patient is still continuing to take her other medications as prescribed and denies any issues or concerns with those medications.  Patient continues to endorse anxiety and depressive symptoms.  Patient reports that she is also experiencing hallucinations that are negative in nature.  Provider informed patient that she will be provided samples of Lybalvi and provider will look into why patient is unable to be prescribed Lybalvi.  Patient vocalized understanding.  Patient to come by the facility to pick up her samples for the medication.  Patient to be provided a month supply of her medication to allow provider time to set patient up with the medication through her pharmacy.  Collaboration of Care: Collaboration of Care: Medication Management AEB provider managing patient's psychiatric medications, Primary Care Provider AEB patient being seen by primary care provider at [place] patient being seen by a mental health provider at this facility, Other provider involved in patient's care AEB patient being seen by Podiatry, and Referral or follow-up with counselor/therapist AEB patient being seen by a licensed clinical social worker at this facility  Patient/Guardian was advised Release of Information must be obtained prior to any record release in order to collaborate their care with an outside provider. Patient/Guardian was advised if they have not already done so to contact the registration department to sign all necessary forms in order for [place] to release information regarding their care.   Consent: Patient/Guardian gives verbal consent for treatment  and assignment of benefits for services provided during this visit. Patient/Guardian expressed understanding and agreed to proceed.   1. Generalized anxiety disorder  - busPIRone (BUSPAR) 30 MG tablet; Take 1 tablet (30 mg total) by mouth 2 (two) times daily.  Dispense: 60 tablet; Refill: 1  2. Bipolar 2 disorder, major depressive episode ([place]) And provided samples of Lybalvi for the management of her bipolar 2 disorder  - lamoTRIgine (LAMICTAL) 100 MG tablet; Take 1 tablet (100 mg total) by mouth daily.  Dispense: 30 tablet; Refill: 1   Patient to follow-up in 6 weeks Provider spent a total of 20 minutes with the patient/reviewing patient's chart  Malachy Mood, PA 01/14/2023, 3:54 PM

## 2023-01-16 ENCOUNTER — Other Ambulatory Visit (HOSPITAL_COMMUNITY): Payer: Self-pay

## 2023-01-21 ENCOUNTER — Other Ambulatory Visit (HOSPITAL_COMMUNITY): Payer: Self-pay

## 2023-01-22 ENCOUNTER — Other Ambulatory Visit (HOSPITAL_COMMUNITY): Payer: Self-pay

## 2023-01-27 ENCOUNTER — Other Ambulatory Visit (HOSPITAL_COMMUNITY): Payer: Self-pay

## 2023-01-30 ENCOUNTER — Other Ambulatory Visit (HOSPITAL_COMMUNITY): Payer: Self-pay

## 2023-02-02 ENCOUNTER — Other Ambulatory Visit: Payer: Self-pay

## 2023-02-02 ENCOUNTER — Other Ambulatory Visit (HOSPITAL_COMMUNITY): Payer: Self-pay

## 2023-02-09 ENCOUNTER — Other Ambulatory Visit: Payer: Self-pay

## 2023-02-10 ENCOUNTER — Other Ambulatory Visit: Payer: Self-pay

## 2023-02-12 ENCOUNTER — Other Ambulatory Visit (HOSPITAL_COMMUNITY): Payer: Self-pay

## 2023-02-13 ENCOUNTER — Other Ambulatory Visit (HOSPITAL_COMMUNITY): Payer: Self-pay

## 2023-02-16 ENCOUNTER — Other Ambulatory Visit: Payer: Self-pay

## 2023-02-16 ENCOUNTER — Other Ambulatory Visit (HOSPITAL_COMMUNITY): Payer: Self-pay

## 2023-02-17 ENCOUNTER — Other Ambulatory Visit: Payer: Self-pay

## 2023-02-17 ENCOUNTER — Other Ambulatory Visit (HOSPITAL_COMMUNITY): Payer: Self-pay

## 2023-02-17 MED ORDER — OZEMPIC (2 MG/DOSE) 8 MG/3ML ~~LOC~~ SOPN
2.0000 mg | PEN_INJECTOR | SUBCUTANEOUS | 3 refills | Status: DC
  Filled 2023-02-17: qty 3, 28d supply, fill #0

## 2023-02-18 ENCOUNTER — Other Ambulatory Visit (HOSPITAL_COMMUNITY): Payer: Self-pay

## 2023-02-23 ENCOUNTER — Ambulatory Visit: Payer: 59 | Admitting: Podiatry

## 2023-02-26 ENCOUNTER — Encounter: Payer: Self-pay | Admitting: Podiatry

## 2023-02-26 ENCOUNTER — Ambulatory Visit (INDEPENDENT_AMBULATORY_CARE_PROVIDER_SITE_OTHER): Payer: 59 | Admitting: Podiatry

## 2023-02-26 DIAGNOSIS — Q828 Other specified congenital malformations of skin: Secondary | ICD-10-CM | POA: Diagnosis not present

## 2023-02-26 DIAGNOSIS — E1149 Type 2 diabetes mellitus with other diabetic neurological complication: Secondary | ICD-10-CM | POA: Diagnosis not present

## 2023-02-26 DIAGNOSIS — Z794 Long term (current) use of insulin: Secondary | ICD-10-CM | POA: Diagnosis not present

## 2023-02-26 NOTE — Progress Notes (Signed)
Subjective: Chief Complaint  Patient presents with   Callouses    Requesting callus trim     52 year old female presents the office today for concerns of painful calluses on both of her feet.  Denies any open sores.  She is scheduled to have her A1c checked again next week.  Calluses are thick causing discomfort but no other concerns today.   Last A1c was 8.3 on October 16, 2021.  Mercie Eon, MD Last seen: October 16, 2022   Objective: AAO x3, NAD DP/PT pulses palpable bilaterally, CRT less than 3 seconds Significant bunions are present bilateral with hammertoes deformities bilaterally.  Flatfoot is present bilaterally.  Hyperkeratotic lesion sulcus of the right first interspace, left submetatarsal 2 as well as fifth metatarsal head on the left foot.  Minimal callus on the medial aspect the left second toe.  There is no underlying ulceration drainage or any signs of infection.  There is no ulcerations. Mild diffuse discomfort of the ankle with mild swelling.  Most of her discomfort with ambulation.  Previous triple arthrodesis noted.  Flatfoot present. No pain with calf compression, swelling, warmth, erythema  Assessment: Right ankle arthritis, flatfoot; hyperkeratotic lesions  Plan: -All treatment options discussed with the patient including all alternatives, risks, complications.  -Sharply debrided hyperkeratotic lesions x 4 without any complications or bleeding  -Daily foot inspection, glucose control. -Patient encouraged to call the office with any questions, concerns, change in symptoms.   Vivi Barrack DPM

## 2023-03-03 NOTE — Progress Notes (Signed)
[facility]: Clinic Note  Subjective:  History of Present Illness: Tina [name] is a 52 y.o. year old female who presents for 3 month follow up of her chronic medical conditions. I am her PCP, I saw her in August, then Dr. Sloan Leiter saw her in December.  She has no questions or concerns today. Would like medicine refills.     Please refer to Assessment and Plan below for full details in Problem-Based Charting.   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   Insomnia due to other mental disorder 03/18/2021   Generalized anxiety disorder 11/12/2020   Severe recurrent major depression with psychotic features (HCC) 10/04/2020   Bipolar 2 disorder, major depressive episode (HCC) 10/04/2020   Fibroids 04/14/2019   Seasonal allergies 02/06/2017   Osteoarthritis of right ankle 08/29/2016   Tobacco abuse 01/21/2016   Severe obesity with body mass index (BMI) of 35.0 to 39.9 with serious comorbidity (HCC) 10/12/2015   Ovarian cyst 12/24/2012   Hypertension 07/20/2012   Healthcare maintenance 06/27/2011   Type 2 diabetes mellitus (HCC) 05/30/2010   Dyslipidemia 05/30/2010   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 USE TO CHECK BLOOD GLUCOSE ONCE DAILY. IM PROGRAM., Disp: 1 kit, Rfl: 0   busPIRone (BUSPAR) 30 MG tablet, Take 1 tablet (30 mg total) by mouth 2 (two) times daily., Disp: 60 tablet, Rfl: 1   Continuous Blood Gluc Sensor (FREESTYLE LIBRE 3 SENSOR) MISC, Place 1 sensor on the skin every 14 days. Use to check glucose continuously, Disp: 6 each, Rfl: 3   cyclobenzaprine (FLEXERIL) 5 MG tablet, Take 1 tablet (5 mg total) by mouth at bedtime as needed for muscle spasms., Disp: 30 tablet, Rfl: 1   gabapentin (NEURONTIN) 400 MG capsule, Take 1 capsule (400 mg total) by mouth 3 (three) times daily., Disp: 90 capsule, Rfl: 1   glucose blood (CONTOUR NEXT TEST) test strip, Please use to check your sugar 3 times a day., Disp: 100 strip, Rfl: 3   glucose blood test strip, Use up to 4 (four) times daily as directed, Disp: 100 each, Rfl: 0   ibuprofen (ADVIL) 800 MG tablet, Take 1 tablet (800 mg total) by mouth every 8 (eight) hours as needed., Disp: 30 tablet, Rfl: 0   Insulin Lispro Prot & Lispro (HUMALOG MIX 75/25 KWIKPEN) (75-25) 100 UNIT/ML Kwikpen, Inject 24 Units into the skin in the morning and at bedtime., Disp: 15 mL, Rfl:  11   Insulin Pen Needle 32G X 4 MM MISC, Use daily at 6pm, Disp: 100 each, Rfl: 4   lamoTRIgine (LAMICTAL) 100 MG tablet, Take 1 tablet (100 mg total) by mouth daily., Disp: 30 tablet, Rfl: 1   Lancets (ONETOUCH DELICA PLUS LANCET33G) MISC, Use up to 4 (four) times daily as directed, Disp: 100 each, Rfl: 0   Lancets MISC, 1 Units by Does not apply route 3 (three) times daily., Disp: 100 each, Rfl: 3   metFORMIN (GLUCOPHAGE) 1000 MG tablet, Take 1 tablet (1,000 mg total) by mouth 2 (two) times daily with a meal., Disp: 180 tablet, Rfl:  1   nicotine polacrilex (NICORETTE) 4 MG gum, Use 1 piece of gum (4 mg total) by mouth as needed for smoking cessation., Disp: 110 tablet, Rfl: 3   nitroGLYCERIN (NITRO-DUR) 0.2 mg/hr patch, Cut patch into one - fourth pieces. Place a one-fourth (1/4) piece of patch on skin over affected area, changing to a new piece every 24 hours., Disp: 30 patch, Rfl: 1   OLANZapine-Samidorphan (LYBALVI) 15-10 MG TABS, Take 1 tablet by mouth at bedtime., Disp: 30 tablet, Rfl: 1   Semaglutide, 2 MG/DOSE, (OZEMPIC, 2 MG/DOSE,) 8 MG/3ML SOPN, Inject 2 mg into the skin once a week., Disp: 3 mL, Rfl: 3   sertraline (ZOLOFT) 50 MG tablet, Take 1 tablet (50 mg total) by mouth daily., Disp: 90 tablet, Rfl: 3   Allergies: Allergies  Allergen Reactions   Codeine Other (See Comments)    hallucinations   Sglt2 Inhibitors     Recurrent yeast infections on Jardiance, stopped 06/2022     Objective:   Vitals: Vitals:   03/04/23 0900  BP: (!) 141/73  Pulse: 94  Temp: 98.3 F (36.8 C)  SpO2: 100%    Physical Exam: Physical Exam Constitutional:      Appearance: Normal appearance.  Cardiovascular:     Rate and Rhythm: Normal rate and regular rhythm.     Heart sounds: No murmur heard. Pulmonary:     Effort: Pulmonary effort is normal. No respiratory distress.     Breath sounds: Normal breath sounds. No wheezing.  Neurological:     Mental Status: She is alert.  Psychiatric:        Mood and Affect: Mood normal.        Behavior: Behavior normal.      Data: Labs, imaging, and micro were reviewed in Epic. Refer to Assessment and Plan below for full details in Problem-Based Charting.  Assessment & Plan:  Type 2 diabetes mellitus (HCC) - A1C today is 12.0, but her CGM data (from March) averages an A1C of 6.8, with her being in range 88% ot time, high 12% of time. No low. She has not changed anything since March, has not run out of medicines, stopped taking anything, or had changes to her diet. I'm not sure  the POC is accurate, so will check a A1C in the lab. For now, continue current meds.  - Continue Metformin 2g daily, Ozempic 2g weekly, Novolog 30/30 24 units BID.  - No Jardiance, as this caused recurrent yeast infections - Urine MACR today - She is established with Dr. Dione Booze and will call him for her annual follow up .   Healthcare maintenance - We will do pap next time. She declined for it today, but feels it's time, and will do it next time - Colonoscopy with polyps in 2014, was due in 2019. The colonoscopy was a traumatic experience, so we  discussed for a while, and she will do stool cards for now, with plan for colonoscopy possibly at a different location if cards abnormal - MMG due 08/2023  Severe obesity with body mass index (BMI) of 35.0 to 39.9 with serious comorbidity (HCC) - Continue Ozempic 2g weekly  Hypertension - BP 139/72 today - Continue Azor (Amlodipine 5/Losartan 20) for now. If urine microalbumin+, will increase dose  Bipolar 2 disorder, major depressive episode (HCC) - She is established with Psychiatry, and has an appointment with them later today - Continue Lybalvi 15/10 (getting samples from Psych), Lamictal 100mg  daily, Gabapentin 400mg  TID, Buspar 30mg  BID - She will talk to her psychiatrist to see if Wellbutrin is an option for her smoking cessation  Tobacco abuse - She smokes about 1/2ppd, is contemplating quitting. Has tried nicotine patches and gum, has refills on these.  - She is interested in Buproprion, but I'm not sure how this would interact with her bipolar medicines - she actually has an appointment with her psychiatrist later today, so I will ask her to discuss this with him - I encouraged tobacco cessation     Patient will follow up in 3 months   Mercie Eon, MD

## 2023-03-04 ENCOUNTER — Other Ambulatory Visit: Payer: Self-pay

## 2023-03-04 ENCOUNTER — Other Ambulatory Visit (HOSPITAL_COMMUNITY): Payer: Self-pay

## 2023-03-04 ENCOUNTER — Encounter: Payer: Self-pay | Admitting: Internal Medicine

## 2023-03-04 ENCOUNTER — Ambulatory Visit (INDEPENDENT_AMBULATORY_CARE_PROVIDER_SITE_OTHER): Payer: 59 | Admitting: Internal Medicine

## 2023-03-04 ENCOUNTER — Telehealth (INDEPENDENT_AMBULATORY_CARE_PROVIDER_SITE_OTHER): Payer: 59 | Admitting: Physician Assistant

## 2023-03-04 VITALS — BP 139/72 | HR 68 | Temp 98.3°F | Ht 75.0 in | Wt 267.8 lb

## 2023-03-04 DIAGNOSIS — F3181 Bipolar II disorder: Secondary | ICD-10-CM

## 2023-03-04 DIAGNOSIS — F333 Major depressive disorder, recurrent, severe with psychotic symptoms: Secondary | ICD-10-CM

## 2023-03-04 DIAGNOSIS — E1165 Type 2 diabetes mellitus with hyperglycemia: Secondary | ICD-10-CM | POA: Diagnosis not present

## 2023-03-04 DIAGNOSIS — Z794 Long term (current) use of insulin: Secondary | ICD-10-CM

## 2023-03-04 DIAGNOSIS — I10 Essential (primary) hypertension: Secondary | ICD-10-CM

## 2023-03-04 DIAGNOSIS — Z716 Tobacco abuse counseling: Secondary | ICD-10-CM

## 2023-03-04 DIAGNOSIS — F17210 Nicotine dependence, cigarettes, uncomplicated: Secondary | ICD-10-CM

## 2023-03-04 DIAGNOSIS — F411 Generalized anxiety disorder: Secondary | ICD-10-CM

## 2023-03-04 DIAGNOSIS — Z0000 Encounter for general adult medical examination without abnormal findings: Secondary | ICD-10-CM

## 2023-03-04 DIAGNOSIS — Z1211 Encounter for screening for malignant neoplasm of colon: Secondary | ICD-10-CM

## 2023-03-04 DIAGNOSIS — Z720 Tobacco use: Secondary | ICD-10-CM

## 2023-03-04 LAB — POCT GLYCOSYLATED HEMOGLOBIN (HGB A1C): Hemoglobin A1C: 12 % — AB (ref 4.0–5.6)

## 2023-03-04 LAB — GLUCOSE, CAPILLARY: Glucose-Capillary: 227 mg/dL — ABNORMAL HIGH (ref 70–99)

## 2023-03-04 MED ORDER — ATORVASTATIN CALCIUM 40 MG PO TABS
40.0000 mg | ORAL_TABLET | Freq: Every day | ORAL | 2 refills | Status: DC
Start: 2023-03-04 — End: 2023-08-12
  Filled 2023-03-04 – 2023-06-02 (×3): qty 100, 100d supply, fill #0
  Filled 2023-07-05: qty 100, 100d supply, fill #1

## 2023-03-04 MED ORDER — CYCLOBENZAPRINE HCL 5 MG PO TABS
5.0000 mg | ORAL_TABLET | Freq: Every evening | ORAL | 1 refills | Status: DC | PRN
  Filled 2023-03-04: qty 30, 30d supply, fill #0
  Filled 2023-10-06: qty 30, 30d supply, fill #1

## 2023-03-04 MED ORDER — OZEMPIC (2 MG/DOSE) 8 MG/3ML ~~LOC~~ SOPN
2.0000 mg | PEN_INJECTOR | SUBCUTANEOUS | 3 refills | Status: DC
  Filled 2023-03-04 – 2023-03-16 (×2): qty 3, 28d supply, fill #0
  Filled 2023-04-13 – 2023-05-10 (×2): qty 3, 28d supply, fill #1
  Filled 2023-06-02: qty 3, 28d supply, fill #2
  Filled 2023-07-05: qty 3, 28d supply, fill #3

## 2023-03-04 MED ORDER — AMLODIPINE-OLMESARTAN 5-20 MG PO TABS
1.0000 | ORAL_TABLET | Freq: Every day | ORAL | 3 refills | Status: DC
  Filled 2023-03-04: qty 90, 90d supply, fill #0
  Filled 2023-04-13 – 2023-07-05 (×2): qty 90, 90d supply, fill #1

## 2023-03-04 MED ORDER — METFORMIN HCL 1000 MG PO TABS
1000.0000 mg | ORAL_TABLET | Freq: Two times a day (BID) | ORAL | 1 refills | Status: DC
Start: 2023-03-04 — End: 2023-04-17
  Filled 2023-03-04: qty 180, 90d supply, fill #0

## 2023-03-04 MED ORDER — GABAPENTIN 400 MG PO CAPS
400.0000 mg | ORAL_CAPSULE | Freq: Three times a day (TID) | ORAL | 1 refills | Status: DC
Start: 2023-03-04 — End: 2023-03-07
  Filled 2023-03-04: qty 90, 30d supply, fill #0

## 2023-03-04 MED ORDER — IBUPROFEN 800 MG PO TABS
800.0000 mg | ORAL_TABLET | Freq: Three times a day (TID) | ORAL | 0 refills | Status: DC | PRN
  Filled 2023-03-04: qty 30, 10d supply, fill #0

## 2023-03-04 MED ORDER — BUSPIRONE HCL 30 MG PO TABS
30.0000 mg | ORAL_TABLET | Freq: Two times a day (BID) | ORAL | 1 refills | Status: DC
Start: 2023-03-04 — End: 2023-03-07
  Filled 2023-03-04: qty 60, 30d supply, fill #0

## 2023-03-04 MED ORDER — INSULIN LISPRO PROT & LISPRO (75-25 MIX) 100 UNIT/ML KWIKPEN
24.0000 [IU] | PEN_INJECTOR | Freq: Two times a day (BID) | SUBCUTANEOUS | 11 refills | Status: DC
  Filled 2023-03-04 – 2023-03-16 (×2): qty 15, 31d supply, fill #0

## 2023-03-04 NOTE — Assessment & Plan Note (Signed)
-   We will do pap next time. She declined for it today, but feels it's time, and will do it next time - Colonoscopy with polyps in 2014, was due in 2019. The colonoscopy was a traumatic experience, so we discussed for a while, and she will do stool cards for now, with plan for colonoscopy possibly at a different location if cards abnormal - MMG due 08/2023

## 2023-03-04 NOTE — Assessment & Plan Note (Addendum)
-   BP 139/72 today - Continue Azor (Amlodipine 5/Losartan 20) for now. If urine microalbumin+, will increase dose

## 2023-03-04 NOTE — Patient Instructions (Addendum)
Dear Tina [name],  It was a pleasure seeing you in clinic today.  Please talk to your Psychiatrist about what options are available to you for quitting smoking (Chantix? Wellbutrin?).   We will do your Pap next time.  Please follow up with your eye doctor.  Please do your stools cards, return them to the clinic, and we will let you know if you need a colonoscopy or not.  I will call you with your A1C result - I'm not sure if the 12.0 was accurate because it doesn't reflect your readings from March. For now, keep taking your medicines as you are.  We'll see you back in 3 months.  Sincerely, Dr. Mercie Eon

## 2023-03-04 NOTE — Assessment & Plan Note (Signed)
-   She is established with Psychiatry, and has an appointment with them later today - Continue Lybalvi 15/10 (getting samples from Psych), Lamictal 100mg  daily, Gabapentin 400mg  TID, Buspar 30mg  BID - She will talk to her psychiatrist to see if Wellbutrin is an option for her smoking cessation

## 2023-03-04 NOTE — Assessment & Plan Note (Addendum)
-   A1C today is 12.0, but her CGM data (from March) averages an A1C of 6.8, with her being in range 88% ot time, high 12% of time. No low. She has not changed anything since March, has not run out of medicines, stopped taking anything, or had changes to her diet. I'm not sure the POC is accurate, so will check a A1C in the lab. For now, continue current meds.  - Continue Metformin 2g daily, Ozempic 2g weekly, Novolog 30/30 24 units BID.  - No Jardiance, as this caused recurrent yeast infections - Urine MACR today - She is established with Dr. Dione Booze and will call him for her annual follow up .

## 2023-03-04 NOTE — Assessment & Plan Note (Signed)
-   She smokes about 1/2ppd, is contemplating quitting. Has tried nicotine patches and gum, has refills on these.  - She is interested in Buproprion, but I'm not sure how this would interact with her bipolar medicines - she actually has an appointment with her psychiatrist later today, so I will ask her to discuss this with him - I encouraged tobacco cessation

## 2023-03-04 NOTE — Assessment & Plan Note (Signed)
-   Continue Ozempic 2g weekly

## 2023-03-05 ENCOUNTER — Ambulatory Visit (INDEPENDENT_AMBULATORY_CARE_PROVIDER_SITE_OTHER): Payer: 59

## 2023-03-05 VITALS — BP 139/72 | HR 68 | Temp 98.3°F | Ht 75.0 in | Wt 267.8 lb

## 2023-03-05 DIAGNOSIS — Z0000 Encounter for general adult medical examination without abnormal findings: Secondary | ICD-10-CM | POA: Diagnosis not present

## 2023-03-05 LAB — BMP8+ANION GAP
Anion Gap: 20 mmol/L — ABNORMAL HIGH (ref 10.0–18.0)
BUN/Creatinine Ratio: 10 (ref 9–23)
BUN: 7 mg/dL (ref 6–24)
CO2: 17 mmol/L — ABNORMAL LOW (ref 20–29)
Calcium: 9.8 mg/dL (ref 8.7–10.2)
Chloride: 103 mmol/L (ref 96–106)
Creatinine, Ser: 0.67 mg/dL (ref 0.57–1.00)
Glucose: 216 mg/dL — ABNORMAL HIGH (ref 70–99)
Potassium: 4.3 mmol/L (ref 3.5–5.2)
Sodium: 140 mmol/L (ref 134–144)
eGFR: 104 mL/min/{1.73_m2} (ref >59)

## 2023-03-05 LAB — MICROALBUMIN / CREATININE URINE RATIO
Creatinine, Urine: 324.6 mg/dL
Microalb/Creat Ratio: 6 mg/g creat (ref 0–29)
Microalbumin, Urine: 19.2 ug/mL

## 2023-03-05 LAB — HEMOGLOBIN A1C
Est. average glucose Bld gHb Est-mCnc: 309 mg/dL
Hgb A1c MFr Bld: 12.4 % — ABNORMAL HIGH (ref 4.8–5.6)

## 2023-03-05 NOTE — Progress Notes (Signed)
Subjective:   Tina [name] is a 52 y.o. female who presents for an Initial Medicare Annual Wellness Visit. I connected with  Rami Waddle Newlun on 03/05/23 by a  Face-To-Face encounter  and verified that I am speaking with the correct person using two identifiers.  Patient Location: Other:  Office/Clinic  Provider Location: Office/Clinic  I discussed the limitations of evaluation and management by telemedicine. The patient expressed understanding and agreed to proceed.  Review of Systems    Defer to PCP       Objective:    Today's Vitals   03/05/23 0909 03/05/23 0910  BP: (!) 141/73 139/72  Pulse: 94 68  Temp: 98.3 F (36.8 C)   TempSrc: Oral   SpO2: 100%   Weight: 267 lb 12.8 oz (121.5 kg)   Height: 6\' 3"  (1.905 m)   PainSc:  0-No pain   Body mass index is 33.47 kg/m.     03/05/2023    9:10 AM 03/04/2023    9:11 AM 10/16/2022   10:50 AM 10/02/2022    8:38 AM 08/17/2022   10:48 AM 06/25/2022   10:10 AM 04/10/2022    9:41 AM  Advanced Directives  Does Patient Have a Medical Advance Directive? Yes Yes Yes Yes No Yes No  Type of Estate agent of [place];Living will Healthcare Power of [place];Living will Healthcare Power of [place];Living will Healthcare Power of [place];Living will  Healthcare Power of [place];Living will   Does patient want to make changes to medical advance directive?  No - Patient declined No - Patient declined No - Patient declined  No - Patient declined   Copy of Healthcare Power of Attorney in Chart? No - copy requested No - copy requested No - copy requested No - copy requested  No - copy requested   Would patient like information on creating a medical advance directive?       No - Patient declined    Current Medications (verified) Outpatient Encounter Medications as of 03/05/2023  Medication Sig   amLODipine-olmesartan (AZOR) 5-20 MG tablet Take 1 tablet by mouth daily.   atorvastatin (LIPITOR)  40 MG tablet Take 1 tablet (40 mg total) by mouth daily.   blood glucose meter kit and supplies KIT Use up to 4 (four) times daily as directed   Blood Glucose Monitoring Suppl (CONTOUR NEXT ONE) KIT 1 each by Does not apply route See admin instructions. USE TO CHECK BLOOD GLUCOSE ONCE DAILY. IM PROGRAM.   busPIRone (BUSPAR) 30 MG tablet Take 1 tablet (30 mg total) by mouth 2 (two) times daily.   Continuous Blood Gluc Sensor (FREESTYLE LIBRE 3 SENSOR) MISC Place 1 sensor on the skin every 14 days. Use to check glucose continuously   cyclobenzaprine (FLEXERIL) 5 MG tablet Take 1 tablet (5 mg total) by mouth at bedtime as needed for muscle spasms.   gabapentin (NEURONTIN) 400 MG capsule Take 1 capsule (400 mg total) by mouth 3 (three) times daily.   glucose blood (CONTOUR NEXT TEST) test strip Please use to check your sugar 3 times a day.   glucose blood test strip Use up to 4 (four) times daily as directed   ibuprofen (ADVIL) 800 MG tablet Take 1 tablet (800 mg total) by mouth every 8 (eight) hours as needed.   Insulin Lispro Prot & Lispro (HUMALOG MIX 75/25 KWIKPEN) (75-25) 100 UNIT/ML Kwikpen Inject 24 Units into the skin in the morning and at bedtime.   Insulin Pen Needle 32G X  4 MM MISC Use daily at 6pm   lamoTRIgine (LAMICTAL) 100 MG tablet Take 1 tablet (100 mg total) by mouth daily.   Lancets (ONETOUCH DELICA PLUS LANCET33G) MISC Use up to 4 (four) times daily as directed   Lancets MISC 1 Units by Does not apply route 3 (three) times daily.   metFORMIN (GLUCOPHAGE) 1000 MG tablet Take 1 tablet (1,000 mg total) by mouth 2 (two) times daily with a meal.   nicotine polacrilex (NICORETTE) 4 MG gum Use 1 piece of gum (4 mg total) by mouth as needed for smoking cessation.   nitroGLYCERIN (NITRO-DUR) 0.2 mg/hr patch Cut patch into one - fourth pieces. Place a one-fourth (1/4) piece of patch on skin over affected area, changing to a new piece every 24 hours.   OLANZapine-Samidorphan (LYBALVI) 15-10 MG  TABS Take 1 tablet by mouth at bedtime.   Semaglutide, 2 MG/DOSE, (OZEMPIC, 2 MG/DOSE,) 8 MG/3ML SOPN Inject 2 mg into the skin once a week.   sertraline (ZOLOFT) 50 MG tablet Take 1 tablet (50 mg total) by mouth daily.   No facility-administered encounter medications on file as of 03/05/2023.    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   Family History  Problem Relation Age of Onset   Colon cancer Sister    Diabetes Mother    Diabetes Father    Diabetes Brother    Celiac disease Paternal Aunt    Social History   Socioeconomic History   Marital status: Single    Spouse name: Not on file   Number of children: 0   Years of education: 12   Highest education level: Some college, no degree  Occupational History   Occupation: TEFL teacher: UNEMPLOYED    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, academic  Tobacco Use   Smoking status: Every Day    Packs/day: 0.50    Years: 23.00    Additional pack years: 0.00    Total pack years: 11.50    Types: Cigarettes   Smokeless tobacco: Never   Tobacco comments:    1/2PPD  Vaping Use   Vaping Use: Never used  Substance and Sexual Activity   Alcohol use: Yes    Alcohol/week: 0.0 standard drinks of alcohol    Comment: occ   Drug use: No   Sexual activity: Yes    Partners: Female    Birth control/protection: Injection    Comment: Depo  Other Topics Concern   Not on file  Social History Narrative   Homosexual; in a monogamous relationship w/ homosexual woman.   She is disabled.  She last  worked in 2018 at [employer].    Right handed   One story home   Caffeine: 1 soda daily, occas drinks coffee    Social Determinants of Health   Financial Resource Strain: Low Risk  (03/05/2023)   Overall Financial Resource Strain (CARDIA)    Difficulty of Paying Living Expenses:  Not very hard  Food Insecurity: Food Insecurity Present (03/05/2023)   Hunger Vital Sign    Worried About Running Out of Food in the Last Year: Sometimes true    Ran Out of Food in the Last Year: Sometimes true  Transportation Needs: No Transportation Needs (03/05/2023)   PRAPARE - Administrator, Civil Service (Medical): No    Lack of Transportation (Non-Medical): No  Physical Activity: Insufficiently Active (03/05/2023)   Exercise Vital Sign    Days of Exercise per Week: 3 days    Minutes of Exercise per Session: 30 min  Stress: Stress Concern Present (03/05/2023)   [employer] of Occupational Health - Occupational Stress Questionnaire    Feeling of Stress : To some extent  Social Connections: Moderately Isolated (03/05/2023)   Social Connection and Isolation Panel [NHANES]    Frequency of Communication with Friends and Family: More than three times a week    Frequency of Social Gatherings with Friends and Family: Once a week    Attends Religious Services: 1 to 4 times per year    Active Member of [employer] or Organizations: No    Attends Engineer, structural: Never    Marital Status: Never married    Tobacco Counseling Ready to quit: Not Answered Counseling given: Not Answered Tobacco comments: 1/2PPD   Clinical Intake:  Pre-visit preparation completed: Yes  Pain : No/denies pain Pain Score: 0-No pain     Nutritional Risks: None Diabetes: Yes CBG done?: Yes CBG resulted in Enter/ Edit results?: Yes Did pt. bring in CBG monitor from home?: Yes Glucose Meter Downloaded?: Yes  How often do you need to have someone help you when you read instructions, pamphlets, or other written  materials from your doctor or pharmacy?: 1 - Never What is the last grade level you completed in school?: college  Diabetic?Nutrition Risk Assessment:  Has the patient had any N/V/D within the last 2 months?  No  Does the patient have any non-healing wounds?  No  Has the patient had any unintentional weight loss or weight gain?  No   Diabetes:  Is the patient diabetic?  Yes  If diabetic, was a CBG obtained today?  Yes  Did the patient bring in their glucometer from home?  Yes   Financial Strains and Diabetes Management:  Are you having any financial strains with the device, your supplies or your medication? No .  Does the patient want to be seen by Chronic Care Management for management of their diabetes?  No  Would the patient like to be referred to a Nutritionist or for Diabetic Management?  No   Diabetic Exams:  Diabetic Eye Exam: Overdue for diabetic eye exam. Pt has been advised about the importance in completing this exam. Patient advised to call and schedule an eye exam. Diabetic Foot Exam: Completed 06/25/2022    Interpreter Needed?: No  Information entered by :: Eyva Califano,cma   Activities of Daily Living    03/05/2023    9:10 AM 03/04/2023    9:12 AM  In your present state of health, do you have any difficulty performing the following activities:  Hearing? 0 0  Vision? 0 0  Difficulty concentrating or making decisions? 0 0  Walking or climbing stairs? 1 1  Dressing or bathing? 0 0  Doing errands, shopping? 0 0    Patient Care Team: Mercie Eon, MD as PCP - General (Internal Medicine) Plyler, Cecil Cranker, RD as Diabetes Educator (Dietician) Plyler, Lupita Leash  M, RD as Dietitian (Dietician) Blima Ledger, OD (Optometry) Zettie Pho, [facility] as Pharmacist (Pharmacist) Shanna Cisco, NP as Referring Physician (Psychiatry)  Indicate any recent Medical Services you may have received from other than [facility] providers in the past year (date may be approximate).      Assessment:   This is a routine wellness examination for [facility].  Hearing/Vision screen No results found.  Dietary issues and exercise activities discussed:     Goals Addressed   None   Depression Screen    03/05/2023    9:10 AM 03/04/2023   11:16 AM 03/04/2023   11:08 AM 01/14/2023   11:15 AM 12/03/2022    9:36 AM 10/16/2022   10:48 AM 08/07/2022    8:40 AM  PHQ 2/9 Scores  PHQ - 2 Score 0  0   3   PHQ- 9 Score      17      Information is confidential and restricted. Go to Review Flowsheets to unlock data.    Fall Risk    03/05/2023    9:10 AM 03/04/2023    9:11 AM 10/16/2022   10:50 AM 10/02/2022    8:43 AM 03/11/2022   10:00 AM  Fall Risk   Falls in the past year? 1 1 1 1 1   Number falls in past yr: 1 0 1 1 0  Injury with Fall? 0 1 1 1 1   Risk for fall due to : Impaired balance/gait Impaired balance/gait Impaired balance/gait  Impaired balance/gait  Follow up Falls evaluation completed;Falls prevention discussed  Falls evaluation completed Falls evaluation completed Falls evaluation completed;Falls prevention discussed    FALL RISK PREVENTION PERTAINING TO THE HOME:  Any stairs in or around the home? Yes  If so, are there any without handrails? No  Home free of loose throw rugs in walkways, pet beds, electrical cords, etc? Yes  Adequate lighting in your home to reduce risk of falls? Yes   ASSISTIVE DEVICES UTILIZED TO PREVENT FALLS:  Life alert? No  Use of a cane, walker or w/c? Yes  Grab bars in the bathroom? No  Shower chair or bench in shower? No  Elevated toilet seat or a handicapped toilet? No   TIMED UP AND GO:  Was the test performed? No .  Length of time to ambulate 10 feet: 0 sec.   Gait slow and steady with assistive device  Cognitive Function:        03/05/2023    9:11 AM  6CIT Screen  What Year? 0 points  What month? 0 points  What time? 0 points  Count back from 20 0 points  Months in reverse 0 points  Repeat phrase 0 points  Total  Score 0 points    Immunizations Immunization History  Administered Date(s) Administered   Tdap 08/29/2016    TDAP status: Up to date  Flu Vaccine status: Up to date  Pneumococcal vaccine status: Due, Education has been provided regarding the importance of this vaccine. Advised may receive this vaccine at local pharmacy or Health Dept. Aware to provide a copy of the vaccination record if obtained from local pharmacy or Health Dept. Verbalized acceptance and understanding.  Covid-19 vaccine status: Information provided on how to obtain vaccines.   Qualifies for Shingles Vaccine? No   Zostavax completed No   Shingrix Completed?: No.    Education has been provided regarding the importance of this vaccine. Patient has been advised to call insurance company to determine out of pocket  expense if they have not yet received this vaccine. Advised may also receive vaccine at local pharmacy or Health Dept. Verbalized acceptance and understanding.  Screening Tests Health Maintenance  Topic Date Due   COVID-19 Vaccine (1) Never done   Zoster Vaccines- Shingrix (1 of 2) Never done   COLON CANCER SCREENING ANNUAL FOBT  05/20/2014   PAP SMEAR-Modifier  07/03/2014   COLONOSCOPY (Pts 45-51yrs Insurance coverage will need to be confirmed)  01/11/2018   Diabetic kidney evaluation - Urine ACR  08/08/2021   OPHTHALMOLOGY EXAM  01/17/2023   LIPID PANEL  03/12/2023   INFLUENZA VACCINE  06/04/2023   HEMOGLOBIN A1C  06/04/2023   FOOT EXAM  06/26/2023   Diabetic kidney evaluation - eGFR measurement  03/03/2024   Medicare Annual Wellness (AWV)  03/04/2024   MAMMOGRAM  10/01/2024   DTaP/Tdap/Td (2 - Td or Tdap) 08/29/2026   Hepatitis C Screening  Completed   HIV Screening  Completed   HPV VACCINES  Aged Out    Health Maintenance  Health Maintenance Due  Topic Date Due   COVID-19 Vaccine (1) Never done   Zoster Vaccines- Shingrix (1 of 2) Never done   COLON CANCER SCREENING ANNUAL FOBT  05/20/2014    PAP SMEAR-Modifier  07/03/2014   COLONOSCOPY (Pts 45-82yrs Insurance coverage will need to be confirmed)  01/11/2018   Diabetic kidney evaluation - Urine ACR  08/08/2021   OPHTHALMOLOGY EXAM  01/17/2023      Mammogram status: Completed 10/01/2022. Repeat every year:2   Lung Cancer Screening: (Low Dose CT Chest recommended if Age 75-80 years, 30 pack-year currently smoking OR have quit w/in 15years.) does not qualify.   Lung Cancer Screening Referral: N/A  Additional Screening:  Hepatitis C Screening: does not qualify; Completed 07/19/2012  Vision Screening: Recommended annual ophthalmology exams for early detection of glaucoma and other disorders of the eye. Is the patient up to date with their annual eye exam?  No  Who is the provider or what is the name of the office in which the patient attends annual eye exams? N/A If pt is not established with a provider, would they like to be referred to a provider to establish care? No .   Dental Screening: Recommended annual dental exams for proper oral hygiene  Community Resource Referral / Chronic Care Management: CRR required this visit?  No   CCM required this visit?  No      Plan:     I have personally reviewed and noted the following in the patient's chart:   Medical and social history Use of alcohol, tobacco or illicit drugs  Current medications and supplements including opioid prescriptions. Patient is not currently taking opioid prescriptions. Functional ability and status Nutritional status Physical activity Advanced directives List of other physicians Hospitalizations, surgeries, and ER visits in previous 12 months Vitals Screenings to include cognitive, depression, and falls Referrals and appointments  In addition, I have reviewed and discussed with patient certain preventive protocols, quality metrics, and best practice recommendations. A written personalized care plan for preventive services as well as  general preventive health recommendations were provided to patient.     Cala Bradford, [facility]   03/05/2023   Nurse Notes: Face-To-Face Visit  Ms. Vandenbosch , Thank you for taking time to come for your Medicare Wellness Visit. I appreciate your ongoing commitment to your health goals. Please review the following plan we discussed and let me know if I can assist you in the future.   These are  the goals we discussed:  Goals       "I need resources to help pay for my diabetes medications." (pt-stated)      CARE PLAN ENTRY Medicaid Managed Care (see longitudinal plan of care for additional care plan information)  Current Barriers:  Community resources access barrier: Patient Lacks knowledge of community resource: to assist with medication obtaining medication such as the [employer]. Medication procurement Financial constraints related to unemployment and on disability and having no insurance. Patient stated she is not eligible for Medicaid due to disability payments being $24 over the limit for Medicaid.   Clinical Social Work Clinical Goal(s):  Over the next 7-14 days, patient will work with BSW to address concerns related to resources needed  Interventions: Inter-disciplinary care team collaboration (see longitudinal plan of care) Patient interviewed and appropriate assessments performed Referred patient to community resources care guide team for assistance with resources to assist with patient getting her diabetes medications. BSW will contact patient to discuss needs and available resources. BSW provided patient with the Medication Assistance Program resource 620-434-0021, to assist with getting her medications at no cost. BSW contacted Dr. Judeth Cornfield to ask for a new prescription for Sertraline since patient has been without medication for 2 days. Dr. Nedra Hai will send prescription to [facility] once system comes up. Update 09/06/2020: LCSW provided information  for patient to access Affordable Care Act at www.healthcare.gov as a possible resource for insurance. Update 10/25/2020: Patient states she still has difficulty paying for her diabetes medication. She questions if she would be able to get her medications at [facility], which is where she receives her mental health medications. LCSW will collaborate with pharmacist regarding medication procurement.  Patient Self Care Activities:  Patient will work with the Managed Medicaid team. Licensed Clinical Social Worker will refer to BSW to assist with resources.  Please see past updates related to this goal by clicking on the "Past Updates" button in the selected goal       "I want to better control my diabetes." (pt-stated)      CARE PLAN ENTRY Medicaid Managed Care (see longitudinal plan of care for additional care plan information)  Current Barriers:  Community resources access barrier: Patient Lacks knowledge of community resource: Patient does not have Medicaid or Medicare and doesn't know any resources to assist with obtaining her medication. 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 Limited social support  Clinical Social Work Clinical Goal(s):  Over the next 7-14 days, patient will work with SW to address concerns related to resources to assist with medication procurement. Over the next 7-14 days, patient will work with RN CM to address needs related to healthy lifestyle and improving diabetes management .  Interventions: Inter-disciplinary care team collaboration (see longitudinal plan of care) Patient interviewed and appropriate assessments performed Provided mental health counseling with regard to diabetes and depression. Patient stated she is unable to adequately care for her own health due to the current stressors of dealing with her terminally ill mother. She stated she wants to live and not  die, but she is so overwhelmed.  Update 09/06/2020: Patient's insulin was recently increased. She is scheduled to see her doctor in December. Advised patient to begin getting some form of exercise daily to help with both mental and physical health. Collaborated with RN Case Manager re: diabetes management Collaborated with BSW re: resources to assist with medication procurement, including the [facility]  Card  Patient Self Care Activities:  Patient will continue to take medications as prescribed. RN Case Manager will work with patient to help her with lifestyle changes for help improvement. Licensed Clinical Social Worker will work with patient for depression symptom management regarding diabetes. BSW will work with patient regarding resources.   Please see past updates related to this goal by clicking on the "Past Updates" button in the selected goal       "I want to better control my high blood pressure." (pt-stated)      CARE PLAN ENTRY Medicaid Managed Care (see longitudinal plan of care for additional care plan information)  Current Barriers:  Mental Health Concerns Social Isolation Limited social support  Clinical Social Work Clinical Goal(s):  Over the next 30 days, patient will work with SW to address concerns related to managing stress to decrease blood pressure Over the next 30 days, patient will demonstrate improved health management independence as evidenced by self-reported decreased blood pressure reading  Interventions: Inter-disciplinary care team collaboration (see longitudinal plan of care) Patient interviewed and appropriate assessments performed Provided mental health counseling with regard to high blood pressure and depression  Provided patient with information about the negative effects of high blood pressure. Patient stated she gets headaches and does feel that her blood pressure is high. She stated she needs different treatment but cannot get it because she is  not eligible for Medicaid due to receiving disability payments that amount to $24 over the limit.  Update 09/06/2020: Patient stated her blood pressure continues to fluctuate up and down. She still takes her medication. She states she continues to deal with her mother, who is receiving Hospice care. Discussed plans with patient for ongoing care management follow up and provided patient with direct contact information for care management team Collaborated with RN Case Manager re: high blood pressure Referred patient to RN CM for blood pressure management.  Patient Self Care Activities:  Patient will continue to take her medication as prescribed. RN Case Manager will contact patient to discuss blood pressure management.   Please see past updates related to this goal by clicking on the "Past Updates" button in the selected goal       Afford my Medications      Timeframe:  Long-Range Goal Priority:  High Start Date:                             Expected End Date:                       Follow Up Date Every 3 months/as needed   - call for medicine refill 2 or 3 days before it runs out    Why is this important?   These steps will help you keep on track with your medicines.   Notes: If patient can't afford medications, will call me and I'll try to get her samples until she can pay      Blood Pressure < 140/90      HEMOGLOBIN A1C < 7.0      LDL CALC < 100      Medication Management      Medicaid Managed Care CARE PLAN ENTRY (see longitudinal plan of care for additional care plan information)  Current Barriers:  Social, financial, community barriers:  Patient can't afford medications. Uses whatever pharmacy she can get meds cheapest Patient's brother died of Covid in early 03-26-20 and  her mother in currently in Hospice care Patient with complex health conditions multiple comorbidities including DM Self-manages medications. Does not use a pill box or other adherence  strategies Pharmacy  [facility] - [place], [place] - [address] [facility] [place] [address] Phone: [phone] Fax: [phone]  [facility] - [place], [place] - [address] [place] [address] Phone: [phone] Fax: [phone]  [facility] [address] [place] - [address] [place] [address] Phone: [phone] Fax: [phone]  [facility] - [place], [place] - [address] [place] [address] Phone: [phone] Fax: [phone]     Pharmacist Clinical Goal(s):  Over the next 14 days, patient will work with PharmD and provider towards optimized medication management  Interventions: Comprehensive medication review performed; medication list updated in electronic medical record Inter-disciplinary care team collaboration (see longitudinal plan of care)  Cholesterol Lab Results  Component Value Date   CHOL 154 08/08/2020   CHOL 178 01/07/2019   CHOL 211 (H) 01/21/2016   Lab Results  Component Value Date   HDL 35 (L) 08/08/2020   HDL 41 01/07/2019   HDL 28 (L) 01/21/2016   Lab Results  Component Value Date   LDLCALC 94 08/08/2020   LDLCALC 96 01/07/2019   LDLCALC 115 (H) 01/21/2016   Lab Results  Component Value Date   TRIG 142 08/08/2020   TRIG 207 (H) 01/07/2019   TRIG 342 (H) 01/21/2016   Lab Results  Component Value Date   CHOLHDL 4.4 08/08/2020   CHOLHDL 4.3 01/07/2019   CHOLHDL 7.5 (H) 01/21/2016  No results found for: LDLDIRECT  Atorvastatin 40mg  Plan:  At goal,  patient stable/ symptoms controlled  Mood Depression screen [facility] 2/9 08/08/2020 07/30/2020 10/10/2019 08/04/2019 04/14/2019  Decreased Interest 3 3 2 3 3   Down, Depressed, Hopeless 3 3 3 3 3   PHQ - 2 Score 6 6 5 6 6   Altered sleeping 2 2 2 1 3   Tired, decreased energy 2 2 2 3 3   Change  in appetite 2 2 1 2 3   Feeling bad or failure about yourself  0 0 0 0 3  Trouble concentrating 3 3 2 3 3   Moving slowly or fidgety/restless 0 1 1 3 3   Suicidal thoughts 0 0 0 2 0  PHQ-9 Score 15 16 13 20 24   Difficult doing work/chores Somewhat difficult - Very difficult Extremely dIfficult Very difficult  Some encounter information is confidential and restricted. Go to Review Flowsheets activity to see all data.  Some recent data might be hidden    Buspirone 10mg  TID Lamotrigine 25mg  Sertraline 50mg  -Managed by counseling Plan:  Defer to counseling  DM Lab Results  Component Value Date   HGBA1C >14.0 (A) 07/30/2020    Lab Results  Component Value Date   LABMICR 7.4 08/08/2020   LABMICR 3.2 06/25/2015   MICROALBUR 1.89 01/25/2014   MICROALBUR 1.80 12/09/2012   Lab Results  Component Value Date   CREATININE 0.71 08/08/2020   BUN 10 08/08/2020   NA 139 08/08/2020   K 4.5 08/08/2020   CL 101 08/08/2020   CO2 20 08/08/2020    Lost monitor when moved house to house Canagliflozin 100mg  QD Aspart 10units TID Glargine 35 Units Sitagliptin 50mg /Metformin 1000mg  BID -Last tested in September, tends to run 100 in day and 200-300  at night -Can't afford insulin, patient is using the "Internal Medicine Program" to get scripts. However, meds have to be written by a provider in the program (Such as Dr. Judeth Cornfield) in order to be $4 and the words, "Internal Medicine Program" have to be written Plan: Coordinate with PCP to see if he's involved in the program. If not, will ask Dr. Nedra Hai to write scripts  Insomnia Trazodone 50mg  Plan: Didn't go over at this visit, patient wanted to focus on sugars, will assess at f/u   HTN BP Readings from Last 3 Encounters:  08/08/20 130/72  07/30/20 (!) 146/64  10/10/19 137/72   Enalapril 20mg  Plan:  At goal,  patient stable/ symptoms controlled  Pain Pain Scale 10/11/20: Patient didn't answer with a number but she said she was content for  today Cyclobenzaprine Gabapentin 300mg  Plan:  At goal,  patient stable/ symptoms controlled   Meds -Can't afford (See DM Section). Will ask doctor to send in refills of Hydroxyzine, Lamotrigine, Trazodone, Buspirone, Zoloft, and Novolog so she can afford them -Needs new DM machine as well   Patient Self Care Activities:  Patient will take medications as prescribed Patient will focus on improved adherence once we get her medications cheaper   Initial goal documentation  Artelia Laroche, Pharm.D., Managed Medicaid Pharmacist - [phone]          This is a list of the screening recommended for you and due dates:  Health Maintenance  Topic Date Due   COVID-19 Vaccine (1) Never done   Zoster (Shingles) Vaccine (1 of 2) Never done   Stool Blood Test  05/20/2014   Pap Smear  07/03/2014   Colon Cancer Screening  01/11/2018   Yearly kidney health urinalysis for diabetes  08/08/2021   Eye exam for diabetics  01/17/2023   Lipid (cholesterol) test  03/12/2023   Flu Shot  06/04/2023   Hemoglobin A1C  06/04/2023   Complete foot exam   06/26/2023   Yearly kidney function blood test for diabetes  03/03/2024   Medicare Annual Wellness Visit  03/04/2024   Mammogram  10/01/2024   DTaP/Tdap/Td vaccine (2 - Td or Tdap) 08/29/2026   Hepatitis C Screening: USPSTF Recommendation to screen - Ages 10-79 yo.  Completed   HIV Screening  Completed   HPV Vaccine  Aged Out

## 2023-03-06 NOTE — Progress Notes (Signed)
I talked to Tina [name] on the phone. Unfortunately, her reading of A1C 12 was not an error, and this is accurate. She has been adherent with her insulin and medicines, but sometimes missed 1 of the 2 daily doses of insulin, ~2x/week. She has a glucometer, but it's very painful to check her sugars. She has the [facility], but doesn't know how to put it on her arm. It is compatible with her phone.  We will: - Continue Metformin 1000mg  BID & Ozempic 2g weekly. No SGLT2 as she had recurrent yeast infections on these - Increase insulin 75/25 from 24 units twice daily to 28 units twice daily - Check fasting sugars daily until she sees Tina [name] for CGM set up - I will message front desk pool & Tina [name] to schedule with Tina [name]'s next available for CGM placement & education - Follow up with me in 4 weeks to review CGM data and make adjustments

## 2023-03-06 NOTE — Progress Notes (Unsigned)
BH MD/PA/NP OP Progress Note  Virtual Visit via Video Note  I connected with Tina [name] on 03/07/23 at 11:00 AM EDT by a video enabled telemedicine application and verified that I am speaking with the correct person using two identifiers.  Location: Patient: Home Provider: Clinic   I discussed the limitations of evaluation and management by telemedicine and the availability of in person appointments. The patient expressed understanding and agreed to proceed.  Follow Up Instructions:   I discussed the assessment and treatment plan with the patient. The patient was provided an opportunity to ask questions and all were answered. The patient agreed with the plan and demonstrated an understanding of the instructions.   The patient was advised to call back or seek an in-person evaluation if the symptoms worsen or if the condition fails to improve as anticipated.  I provided 16 minutes of non-face-to-face time during this encounter.  Meta Hatchet, PA   03/07/2023 7:01 PM Dody Dinga  MRN:  [id number]  Chief Complaint:  Chief Complaint  Patient presents with   Follow-up   Medication Management   HPI:   Tina [name] is a 52 year old, African-American female with a past psychiatric history significant for bipolar 2 disorder emphases major depressive episode), generalized anxiety disorder, insomnia, and tobacco use disorder who presents to [facility] via virtual video visit for follow-up and medication management.  Patient is currently being managed on the following psychiatric medications:  Lybalvi 15 mg / 10 mg at bedtime Gabapentin 400 mg 3 times daily Buspirone 30 mg 2 times daily Lamotrigine 100 mg daily  Patient reports that she has been unable to obtain her Lybalvi prescription due to the medication being too expensive.  Patient reports that the medication has been helpful but she is still not  able to sleep.  Patient has a history of using trazodone with minimal effectiveness in managing her sleep. Patient also reports that she has been experiencing irritability.  Patient endorses depression and rates her depression an 8 out of 10 with 10 being most severe.  Patient endorses depression every day stating that her symptoms will occasionally last for the whole day.  Patient endorses the following depressive symptoms: self isolation, low mood, and decreased appetite.  Patient also endorses anxiety and rates her anxiety as 7 or 8 out out of 10.  Despite not being able to afford her medication, patient denies any other stressors at this time.  A PHQ-9 screen was performed with the patient scoring a 19.  A GAD-7 screen was also performed with the patient scoring a 17.  Patient is alert and oriented x 4, calm, cooperative, and fully engaged in conversation during the encounter.  Patient endorses okay mood.  Patient denies suicidal or homicidal ideations.  She further denies auditory or visual hallucinations and does not appear to be responding to internal/external stimuli.  Patient endorses poor sleep and receives on average 2 to 3 hours of sleep per night.  Patient endorses decreased appetite and eats on average 1 meal per day.  Patient denies alcohol consumption and illicit drug use.  Patient endorses tobacco use and smokes on average 1/2 pack/day.  Visit Diagnosis:    ICD-10-CM   1. Generalized anxiety disorder  F41.1 gabapentin (NEURONTIN) 400 MG capsule    busPIRone (BUSPAR) 30 MG tablet    2. Bipolar 2 disorder, major depressive episode (HCC)  F31.81 gabapentin (NEURONTIN) 400 MG capsule    lamoTRIgine (LAMICTAL) 100 MG  tablet       Past Psychiatric History:  Insomnia Tobacco use Anxiety Depression Biplor 2 disorder  Past Medical History:  Past Medical History:  Diagnosis Date   Diabetes mellitus    type II   Fibroids    HTN (hypertension)    Hyperlipidemia    Iron deficiency  anemia    Left acetabular fracture (HCC)    Lumbar strain    Obesity    Ovarian cyst     Past Surgical History:  Procedure Laterality Date   FOOT ARTHRODESIS, MODIFIED MCBRIDE     right foot bunion surgery and ankle surgery 1980s   ORIF ACETABULAR FRACTURE     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    Family Psychiatric History:  Niece - Schizophrenia  Family History:  Family History  Problem Relation Age of Onset   Colon cancer Sister    Diabetes Mother    Diabetes Father    Diabetes Brother    Celiac disease Paternal Aunt     Social History:  Social History   Socioeconomic History   Marital status: Single    Spouse name: Not on file   Number of children: 0   Years of education: 12   Highest education level: Some college, no degree  Occupational History   Occupation: TEFL teacher: UNEMPLOYED    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, academic  Tobacco Use   Smoking status: Every Day    Packs/day: 0.50    Years: 23.00    Additional pack years: 0.00    Total pack years: 11.50    Types: Cigarettes   Smokeless tobacco: Never   Tobacco comments:    1/2PPD  Vaping Use   Vaping Use: Never used  Substance and Sexual Activity   Alcohol use: Yes    Alcohol/week: 0.0 standard drinks of alcohol    Comment: occ   Drug use: No   Sexual activity: Yes    Partners: Female    Birth control/protection: Injection    Comment: Depo  Other Topics Concern   Not on file  Social History Narrative   Homosexual; in a monogamous relationship w/ homosexual woman.   She is disabled.  She last worked in 2018 at [employer].    Right handed   One story home   Caffeine: 1 soda daily, occas drinks coffee    Social Determinants of Health   Financial Resource Strain: Low Risk  (03/05/2023)   Overall Financial Resource Strain (CARDIA)    Difficulty of Paying Living Expenses: Not very hard  Food Insecurity: Food Insecurity Present (03/05/2023)   Hunger Vital Sign    Worried About Running Out of Food in the Last  Year: Sometimes true    Ran Out of Food in the Last Year: Sometimes true  Transportation Needs: No Transportation Needs (03/05/2023)   PRAPARE - Administrator, Civil Service (Medical): No    Lack of Transportation (Non-Medical): No  Physical Activity: Insufficiently Active (03/05/2023)   Exercise Vital Sign    Days of Exercise per Week: 3 days    Minutes of Exercise per Session: 30 min  Stress: Stress Concern Present (03/05/2023)   [employer] of Occupational Health - Occupational Stress Questionnaire    Feeling of Stress : To some extent  Social Connections: Moderately Isolated (03/05/2023)   Social Connection and Isolation Panel [NHANES]    Frequency of Communication with Friends and Family: More than three times a week    Frequency of Social Gatherings  with Friends and Family: Once a week    Attends Religious Services: 1 to 4 times per year    Active Member of Clubs or Organizations: No    Attends Banker Meetings: Never    Marital Status: Never married    Allergies:  Allergies  Allergen Reactions   Codeine Other (See Comments)    hallucinations   Sglt2 Inhibitors     Recurrent yeast infections on Jardiance, stopped 06/2022    Metabolic Disorder Labs: Lab Results  Component Value Date   HGBA1C 12.4 (H) 03/04/2023   MPG 200 05/25/2017   MPG 237 02/26/2016   No results found for: "PROLACTIN" Lab Results  Component Value Date   CHOL 164 03/11/2022   TRIG 195 (H) 03/11/2022   HDL 33 (L) 03/11/2022   CHOLHDL 5.0 (H) 03/11/2022   VLDL 50 (H) 08/21/2014   LDLCALC 97 03/11/2022   LDLCALC 94 08/08/2020   Lab Results  Component Value Date   TSH 1.074 06/27/2011    Therapeutic Level Labs: No results found for: "LITHIUM" No results found for: "VALPROATE" No results found for: "CBMZ"  Current Medications: Current Outpatient Medications  Medication Sig Dispense Refill   amLODipine-olmesartan (AZOR) 5-20 MG tablet Take 1 tablet by mouth daily.  90 tablet 3   atorvastatin (LIPITOR) 40 MG tablet Take 1 tablet (40 mg total) by mouth daily. 100 tablet 2   blood glucose meter kit and supplies KIT Use up to 4 (four) times daily as directed 1 each 0   Blood Glucose Monitoring Suppl (CONTOUR NEXT ONE) KIT 1 each by Does not apply route See admin instructions. USE TO CHECK BLOOD GLUCOSE ONCE DAILY. IM PROGRAM. 1 kit 0   busPIRone (BUSPAR) 30 MG tablet Take 1 tablet (30 mg total) by mouth 2 (two) times daily. 60 tablet 1   Continuous Blood Gluc Sensor (FREESTYLE LIBRE 3 SENSOR) MISC Place 1 sensor on the skin every 14 days. Use to check glucose continuously 6 each 3   cyclobenzaprine (FLEXERIL) 5 MG tablet Take 1 tablet (5 mg total) by mouth at bedtime as needed for muscle spasms. 30 tablet 1   gabapentin (NEURONTIN) 400 MG capsule Take 1 capsule (400 mg total) by mouth 3 (three) times daily. 90 capsule 1   glucose blood (CONTOUR NEXT TEST) test strip Please use to check your sugar 3 times a day. 100 strip 3   glucose blood test strip Use up to 4 (four) times daily as directed 100 each 0   ibuprofen (ADVIL) 800 MG tablet Take 1 tablet (800 mg total) by mouth every 8 (eight) hours as needed. 30 tablet 0   Insulin Lispro Prot & Lispro (HUMALOG MIX 75/25 KWIKPEN) (75-25) 100 UNIT/ML Kwikpen Inject 24 Units into the skin in the morning and at bedtime. 15 mL 11   Insulin Pen Needle 32G X 4 MM MISC Use daily at 6pm 100 each 4   lamoTRIgine (LAMICTAL) 100 MG tablet Take 1 tablet (100 mg total) by mouth daily. 30 tablet 1   Lancets (ONETOUCH DELICA PLUS LANCET33G) MISC Use up to 4 (four) times daily as directed 100 each 0   Lancets MISC 1 Units by Does not apply route 3 (three) times daily. 100 each 3   metFORMIN (GLUCOPHAGE) 1000 MG tablet Take 1 tablet (1,000 mg total) by mouth 2 (two) times daily with a meal. 180 tablet 1   nicotine polacrilex (NICORETTE) 4 MG gum Use 1 piece of gum (4 mg total) by  mouth as needed for smoking cessation. 110 tablet 3    nitroGLYCERIN (NITRO-DUR) 0.2 mg/hr patch Cut patch into one - fourth pieces. Place a one-fourth (1/4) piece of patch on skin over affected area, changing to a new piece every 24 hours. 30 patch 1   OLANZapine-Samidorphan (LYBALVI) 15-10 MG TABS Take 1 tablet by mouth at bedtime. 30 tablet 1   Semaglutide, 2 MG/DOSE, (OZEMPIC, 2 MG/DOSE,) 8 MG/3ML SOPN Inject 2 mg into the skin once a week. 3 mL 3   sertraline (ZOLOFT) 50 MG tablet Take 1 tablet (50 mg total) by mouth daily. 90 tablet 3   No current facility-administered medications for this visit.     Musculoskeletal: Strength & Muscle Tone: Unable to assess due to telemedicine visit Gait & Station: Unable to assess due to telemedicine visit Patient leans: Unable to assess due to telemedicine visit  Psychiatric Specialty Exam: Review of Systems  Psychiatric/Behavioral:  Positive for hallucinations and sleep disturbance. Negative for self-injury and suicidal ideas. The patient is nervous/anxious. The patient is not hyperactive.     There were no vitals taken for this visit.There is no height or weight on file to calculate BMI.  General Appearance: Casual  Eye Contact:  Good  Speech:  Clear and Coherent and Normal Rate  Volume:  Normal  Mood:  Anxious and Depressed  Affect:  Congruent  Thought Process:  Coherent, Goal Directed, and Descriptions of Associations: Intact  Orientation:  Full (Time, Place, and Person)  Thought Content: WDL   Suicidal Thoughts:  No  Homicidal Thoughts:  No  Memory:  Immediate;   Good Recent;   Good Remote;   Good  Judgement:  Good  Insight:  Good  Psychomotor Activity:  Normal  Concentration:  Concentration: Good and Attention Span: Good  Recall:  Good  Fund of Knowledge: Good  Language: Good  Akathisia:  No  Handed:  Right  AIMS (if indicated): not done  Assets:  Communication Skills Desire for Improvement Financial Resources/Insurance Housing Social Support  ADL's:  Intact  Cognition: WNL   Sleep:  Poor   Screenings: GAD-7    Flowsheet Row Video Visit from 03/04/2023 in [facility] Video Visit from 01/14/2023 in [facility] Video Visit from 12/03/2022 in [facility] Video Visit from 08/07/2022 in [facility] Video Visit from 05/02/2022 in [facility]  Total GAD-7 Score 17 17 18 19 16       PHQ2-9    Flowsheet Row Video Visit from 03/04/2023 in [facility] Most recent reading at 03/04/2023 11:16 AM Office Visit from 03/04/2023 in [facility] Most recent reading at 03/04/2023 11:08 AM Video Visit from 01/14/2023 in [facility] Most recent reading at 01/14/2023 11:15 AM Video Visit from 12/03/2022 in [facility] Most recent reading at 12/03/2022  9:36 AM Office Visit from 10/16/2022 in [facility] Most recent reading at 10/16/2022 10:48 AM  PHQ-2 Total Score 5 0 5 6 3   PHQ-9 Total Score 19 -- 16 19 17       Flowsheet Row Video Visit from 03[facility]04/2023 in [facility] Video Visit from 01/14/2023 in [facility] Video Visit from 12/03/2022 in [facility]  C-SSRS RISK CATEGORY No Risk No Risk No Risk        Assessment and Plan:  Tina [name] is a 52 year old, African-American female with a past psychiatric history significant for bipolar 2 disorder emphases major depressive episode), generalized anxiety disorder, insomnia, and tobacco use disorder who presents to [facility] via virtual video visit for follow-up and medication management.  Patient reports that she still has not been able to afford her Lybalvi prescription.  In addition to being unable to afford the medication, patient states  that the medication has been minimally effective in managing her current symptoms.  Patient continues to endorse ongoing depression and anxiety as well as issues with sleep.  Due to patient being unable to afford the medication, patient to be tapered off her Lybalvi prescription.  Provider to provide patient with samples of Lybalvi in order to taper off the medication effectively.  Patient to take Lybalvi 10 mg / 10 mg for 7 days followed by Lybalvi 5 mg / 10 mg for 7 more days before discontinuing the medication.  Provider recommended patient being placed on Vraylar 1.5 mg daily for the management of her depressive symptoms and anxiety once she has tapered also Lybalvi..  Patient was agreeable to recommendation.  Patient medications to be prescribed to pharmacy of choice.  Provider went over potential side effects of patient's psychiatric medications.  Patient vocalized understanding.  Patient to return to this facility to pick up prescriptions for her medication.  Collaboration of Care: Collaboration of Care: Medication Management AEB provider managing patient's psychiatric medications, Primary Care Provider AEB patient being seen by primary care provider at [facility], Psychiatrist AEB patient being seen by a mental health provider at this facility, Other provider involved in patient's care AEB patient being seen by Podiatry, and Referral or follow-up with counselor/therapist AEB patient being seen by a licensed clinical social worker at this facility  Patient/Guardian was advised Release of Information must be obtained prior to any record release in order to collaborate their care with an outside provider. Patient/Guardian was advised if they have not already done so to contact the registration department to sign all necessary forms in order for [place] to release information regarding their care.   Consent: Patient/Guardian gives verbal consent for treatment and assignment of  benefits for services provided during this visit. Patient/Guardian expressed understanding and agreed to proceed.   1. Generalized anxiety disorder  - gabapentin (NEURONTIN) 400 MG capsule; Take 1 capsule (400 mg total) by mouth 3 (three) times daily.  Dispense: 90 capsule; Refill: 1 - busPIRone (BUSPAR) 30 MG tablet; Take 1 tablet (30 mg total) by mouth 2 (two) times daily.  Dispense: 60 tablet; Refill: 1  2. Bipolar 2 disorder, major depressive episode (HCC)  - gabapentin (NEURONTIN) 400 MG capsule; Take 1 capsule (400 mg total) by mouth 3 (three) times daily.  Dispense: 90 capsule; Refill: 1 - lamoTRIgine (LAMICTAL) 100 MG tablet; Take 1 tablet (100 mg total) by mouth daily.  Dispense: 30 tablet; Refill: 1  Patient to follow-up in 6 weeks Provider spent a total of 16 minutes with the patient/reviewing patient's chart  Meta Hatchet, PA 03/07/2023, 7:01 PM

## 2023-03-07 ENCOUNTER — Encounter (HOSPITAL_COMMUNITY): Payer: Self-pay | Admitting: Physician Assistant

## 2023-03-07 MED ORDER — LAMOTRIGINE 100 MG PO TABS
100.0000 mg | ORAL_TABLET | Freq: Every day | ORAL | 1 refills | Status: DC
Start: 2023-03-07 — End: 2023-04-14
  Filled 2023-03-07: qty 30, 30d supply, fill #0
  Filled 2023-04-13: qty 30, 30d supply, fill #1

## 2023-03-07 MED ORDER — GABAPENTIN 400 MG PO CAPS
400.0000 mg | ORAL_CAPSULE | Freq: Three times a day (TID) | ORAL | 1 refills | Status: DC
Start: 2023-03-07 — End: 2023-04-14
  Filled 2023-03-07 – 2023-03-17 (×3): qty 90, 30d supply, fill #0

## 2023-03-07 MED ORDER — BUSPIRONE HCL 30 MG PO TABS
30.0000 mg | ORAL_TABLET | Freq: Two times a day (BID) | ORAL | 1 refills | Status: DC
Start: 2023-03-07 — End: 2023-04-14
  Filled 2023-03-09: qty 60, 30d supply, fill #0
  Filled 2023-04-13: qty 60, 30d supply, fill #1

## 2023-03-09 ENCOUNTER — Other Ambulatory Visit (HOSPITAL_COMMUNITY): Payer: Self-pay

## 2023-03-09 ENCOUNTER — Other Ambulatory Visit: Payer: Self-pay

## 2023-03-12 ENCOUNTER — Encounter: Payer: 59 | Admitting: Dietician

## 2023-03-12 ENCOUNTER — Telehealth: Payer: Self-pay | Admitting: Dietician

## 2023-03-12 NOTE — Telephone Encounter (Signed)
Tina [name]'s appointment was rescheduled to Tuesday May 14 at 815 am.

## 2023-03-16 ENCOUNTER — Other Ambulatory Visit (HOSPITAL_COMMUNITY): Payer: Self-pay

## 2023-03-17 ENCOUNTER — Ambulatory Visit (INDEPENDENT_AMBULATORY_CARE_PROVIDER_SITE_OTHER): Payer: 59 | Admitting: Dietician

## 2023-03-17 ENCOUNTER — Other Ambulatory Visit: Payer: Self-pay | Admitting: Dietician

## 2023-03-17 ENCOUNTER — Other Ambulatory Visit (HOSPITAL_COMMUNITY): Payer: Self-pay

## 2023-03-17 ENCOUNTER — Other Ambulatory Visit: Payer: Self-pay

## 2023-03-17 DIAGNOSIS — E1165 Type 2 diabetes mellitus with hyperglycemia: Secondary | ICD-10-CM

## 2023-03-17 DIAGNOSIS — Z794 Long term (current) use of insulin: Secondary | ICD-10-CM | POA: Diagnosis not present

## 2023-03-17 MED ORDER — INSULIN PEN NEEDLE 32G X 4 MM MISC
4 refills | Status: DC
Start: 2023-03-17 — End: 2024-05-12
  Filled 2023-03-17: qty 100, 50d supply, fill #0
  Filled 2023-10-29: qty 100, 50d supply, fill #1

## 2023-03-17 MED ORDER — INSULIN LISPRO PROT & LISPRO (75-25 MIX) 100 UNIT/ML KWIKPEN
28.0000 [IU] | PEN_INJECTOR | Freq: Two times a day (BID) | SUBCUTANEOUS | 11 refills | Status: DC
Start: 2023-03-17 — End: 2023-08-12

## 2023-03-17 NOTE — Telephone Encounter (Signed)
Needs pen needles and takes 28 units of insulin twice daily before meals per Dr. Lafonda Mosses.

## 2023-03-17 NOTE — Progress Notes (Signed)
Diabetes Self-Management Education  Visit Type:  Annual Follow-Up  Appt. Start Time: 810 Appt. End Time: 850  03/17/2023  [name], identified by name and date of birth, is a 52 y.o. female with a diagnosis of Diabetes:  .   ASSESSMENT  Lab Results  Component Value Date   HGBA1C 12.4 (H) 03/04/2023   HGBA1C 12.0 (A) 03/04/2023   HGBA1C 8.3 (A) 10/16/2022   HGBA1C 7.4 (A) 06/25/2022   HGBA1C 7.4 (A) 01/14/2022        Diabetes Self-Management Education - 03/17/23 0800       Health Coping   How would you rate your overall health? Good      Psychosocial Assessment   Patient Belief/Attitude about Diabetes Motivated to manage diabetes    What is the hardest part about your diabetes right now, causing you the most concern, or is the most worrisome to you about your diabetes?   Checking blood sugar    Self-care barriers Unsteady gait/risk for falls    Self-management support Doctor's office;CDE visits    Patient Concerns Monitoring    Special Needs None    Preferred Learning Style Hands on    Learning Readiness Ready      Pre-Education Assessment   Patient understands the diabetes disease and treatment process. Comprehends key points    Patient understands incorporating nutritional management into lifestyle. Comprehends key points    Patient undertands incorporating physical activity into lifestyle. Needs Review    Patient understands using medications safely. Comprehends key points    Patient understands monitoring blood glucose, interpreting and using results Needs Review    Patient understands prevention, detection, and treatment of acute complications. Comprehends key points    Patient understands prevention, detection, and treatment of chronic complications. Compreheands key points    Patient understands how to develop strategies to address psychosocial issues. Comprehends key points    Patient understands how to develop strategies to promote health/change  behavior. Comprehends key points      Complications   Last HgB A1C per patient/outside source 12.4 %    How often do you check your blood sugar? 0 times/day (not testing)    Fasting Blood glucose range (mg/dL) --   she did not know   Postprandial Blood glucose range (mg/dL) --   she did not know   Number of hypoglycemic episodes per month 0    Number of hyperglycemic episodes ( >200mg /dL): Daily    Can you tell when your blood sugar is high? No    Have you had a dilated eye exam in the past 12 months? No    Have you had a dental exam in the past 12 months? No    Are you checking your feet? Yes    How many days per week are you checking your feet? 7      Dietary Intake   Breakfast we did not discuss today      Activity / Exercise   Activity / Exercise Type ADL's;Light (walking / raking leaves)   uses a cane to walk     Patient Education   Previous Diabetes Education Yes (please comment)   here and other places she does not remember   Being Active Role of exercise on diabetes management, blood pressure control and cardiac health.    Medications --   she states she takes insulin before meals,pen needle prescription is insufficient for current doses and has expired   Monitoring Taught/evaluated CGM (comment)   she  reloaded the Freestyleliber 3 app and started a new sensor today. we adjusted the alarms to her preference and it was in warm up     Individualized Goals (developed by patient)   Monitoring  Consistenly use CGM   she agreed to enter remote code at follow up     Post-Education Assessment   Patient undertands incorporating physical activity into lifestyle. Comprehends key points    Patient understands monitoring blood glucose, interpreting and using results Comprehends key points      Outcomes   Program Status Not Completed      Subsequent Visit   Since your last visit have you continued or begun to take your medications as prescribed? Yes    Since your last visit have you  had your blood pressure checked? Yes    Is your most recent blood pressure lower, unchanged, or higher since your last visit? Lower    Since your last visit have you experienced any weight changes? Loss    Weight Loss (lbs) 15    Since your last visit, are you checking your blood glucose at least once a day? No             Learning Objective:  Patient will have a greater understanding of diabetes self-management. Patient education plan is to attend individual and/or group sessions per assessed needs and concerns.   Plan:   Patient Instructions  Thank you for your visit today!  I suggest we follow up in 10 days to 2 weeks. Bring a sensor and I can help you put it in if needed. See appointment.  Please use the sensor to continuously monitor your blood sugar.   Will request pen needles prescription.  Be sure to take insulin 15-30 minutes before eating meals.  Lupita Leash [phone]    Expected Outcomes:  Demonstrated interest in learning but significant barriers to change  Education material provided: Diabetes Resources  If problems or questions, patient to contact team via:  Phone  Future DSME appointment: - 2 wks Norm Parcel, RD 03/17/2023 9:06 AM.

## 2023-03-17 NOTE — Patient Instructions (Addendum)
Thank you for your visit today!  I suggest we follow up in 10 days to 2 weeks. Bring a sensor and I can help you put it in if needed. See appointment.  Please use the sensor to continuously monitor your blood sugar.   Will request pen needles prescription.  Be sure to take insulin 15-30 minutes before eating meals.  Lupita Leash [phone]

## 2023-03-18 ENCOUNTER — Telehealth: Payer: Self-pay | Admitting: Dietician

## 2023-03-18 NOTE — Telephone Encounter (Signed)
Doing well with freestyle libre 3. Moved follow up appointment to May 30th at 815 AM.

## 2023-03-20 ENCOUNTER — Other Ambulatory Visit (HOSPITAL_COMMUNITY): Payer: Self-pay

## 2023-03-23 ENCOUNTER — Other Ambulatory Visit (HOSPITAL_COMMUNITY): Payer: Self-pay

## 2023-03-23 ENCOUNTER — Telehealth: Payer: Self-pay | Admitting: *Deleted

## 2023-03-23 MED ORDER — INSULIN LISPRO PROT & LISPRO (75-25 MIX) 100 UNIT/ML KWIKPEN
28.0000 [IU] | PEN_INJECTOR | Freq: Two times a day (BID) | SUBCUTANEOUS | 11 refills | Status: DC
  Filled 2023-03-23: qty 15, 27d supply, fill #0
  Filled 2023-04-13 – 2023-04-16 (×2): qty 15, 27d supply, fill #1

## 2023-03-23 NOTE — Telephone Encounter (Signed)
Call from pharmacy did not receive prescription for the Humalog Insulin 75/25.  Call to Pharmacy.  Inject 28 units 2 times daily with 11 refills.  Dispense 15 ml.

## 2023-03-31 ENCOUNTER — Telehealth: Payer: Self-pay

## 2023-03-31 ENCOUNTER — Encounter: Payer: Self-pay | Admitting: Dietician

## 2023-03-31 NOTE — Telephone Encounter (Signed)
Pt is requesting a call back . She is wanting to know when she is to come back in for her next depo shot

## 2023-03-31 NOTE — Telephone Encounter (Signed)
RTC to patient asking when her next Depo Injection is due.  Patient was due 03/11/2023.  Has an appointment with D. Plyler on 04/02/2023.  Patient given appointment to have injection done that day.

## 2023-04-02 ENCOUNTER — Ambulatory Visit (INDEPENDENT_AMBULATORY_CARE_PROVIDER_SITE_OTHER): Payer: 59 | Admitting: Dietician

## 2023-04-02 ENCOUNTER — Ambulatory Visit (INDEPENDENT_AMBULATORY_CARE_PROVIDER_SITE_OTHER): Payer: 59 | Admitting: *Deleted

## 2023-04-02 DIAGNOSIS — Z3042 Encounter for surveillance of injectable contraceptive: Secondary | ICD-10-CM | POA: Diagnosis not present

## 2023-04-02 DIAGNOSIS — Z3202 Encounter for pregnancy test, result negative: Secondary | ICD-10-CM | POA: Diagnosis not present

## 2023-04-02 DIAGNOSIS — D219 Benign neoplasm of connective and other soft tissue, unspecified: Secondary | ICD-10-CM

## 2023-04-02 DIAGNOSIS — Z794 Long term (current) use of insulin: Secondary | ICD-10-CM

## 2023-04-02 DIAGNOSIS — E1165 Type 2 diabetes mellitus with hyperglycemia: Secondary | ICD-10-CM

## 2023-04-02 LAB — POCT URINE PREGNANCY: Preg Test, Ur: NEGATIVE

## 2023-04-02 MED ORDER — MEDROXYPROGESTERONE ACETATE 104 MG/0.65ML ~~LOC~~ SUSY
104.0000 mg | PREFILLED_SYRINGE | Freq: Once | SUBCUTANEOUS | Status: AC
Start: 2023-04-02 — End: 2023-04-02
  Administered 2023-04-02: 104 mg via SUBCUTANEOUS

## 2023-04-02 NOTE — Progress Notes (Signed)
I discussed with the nurse and agree with the note.

## 2023-04-02 NOTE — Patient Instructions (Addendum)
Let's follow up in about 4 weeks- see appointment next page.   Good job placing sensor and starting it today  Your blood sugars are much improved.   Target blood sugar while sleeping is greater than 110 and less than 160.  Consider a decrease in your evening dose byt 2-4 units to achieve this on a regular basis,.  Good Job!  Lupita Leash [phone]

## 2023-04-02 NOTE — Progress Notes (Signed)
Diabetes Self-Management Education  Visit Type: Annual Follow-Up  Appt. Start Time: 815 Appt. End Time: 900  04/02/2023  [name], identified by name and date of birth, is a 52 y.o. female with a diagnosis of Diabetes:  .   ASSESSMENT  Her goal is to lower her blood sugars. She did not want to lower her insulin dose despite having lower than target ~50% of the time overnight. We agreed to monitor this and if it continues to discuss with her doctor.    CGM Results from download:   % Time CGM active:   96 %   (Goal >70%)  Average glucose:   141 mg/dL for 14 days  Glucose management indicator:   6.7 %  Time in range (70-180 mg/dL):   92 %   (Goal >45%)  Time High (181-250 mg/dL):   8 %   (Goal < 40%)  Time Very High (>250 mg/dL):    0 %   (Goal < 5%)  Time Low (54-69 mg/dL):   0 %   (Goal <9%)  Time Very Low (<54 mg/dL):   0 %   (Goal <8%)  Coefficient of variation:   19.4 %   (Goal <36%)   Flat pattern for the most part with mildly increased blood sugar after breakfast and dinner. She states two lows in the 60s occurred with symptoms due to not eating. She was able to treat them and get them up quickly so they did not register as lows on the above table.   Diabetes Self-Management Education - 04/02/23 0900       Health Coping   How would you rate your overall health? Good      Complications   Fasting Blood glucose range (mg/dL) 11-914;782-956    Postprandial Blood glucose range (mg/dL) 213-086;578-469;>629    Have you had a dilated eye exam in the past 12 months? --   agreed to schedule   Have you had a dental exam in the past 12 months? --   agreed to schedule   Are you checking your feet? --   sees podiatrist every 3 months     Dietary Intake   Breakfast 10-11 cereal    Lunch 2-3 PM salad and juice    Dinner 6-7 meat, starch, soda sometimes has subway 6" steak and cheese or grilled chicken    Beverage(s) water, juice, diet soda      Activity / Exercise    Activity / Exercise Type ADL's;Light (walking / raking leaves)    How many days per week do you exercise? --   need to discuss at next visit     Patient Education   Previous Diabetes Education Yes (please comment)   here     Individualized Goals (developed by patient)   Monitoring  Consistenly use CGM      Patient Self-Evaluation of Goals - Patient rates self as meeting previously set goals (% of time)   Monitoring >75% (most of the time)      Post-Education Assessment   Patient understands monitoring blood glucose, interpreting and using results Comprehends key points      Outcomes   Expected Outcomes Demonstrated interest in learning. Expect positive outcomes    Future DMSE 4-6 wks    Program Status Not Completed      Subsequent Visit   Since your last visit have you continued or begun to take your medications as prescribed? Yes    Since your last visit have you  had your blood pressure checked? No    Since your last visit have you experienced any weight changes? --   she deferred weighing today   Since your last visit, are you checking your blood glucose at least once a day? Yes   using the CGM all but last 2 days            Individualized Plan for Diabetes Self-Management Training:   Learning Objective:  Patient will have a greater understanding of diabetes self-management. Patient education plan is to attend individual and/or group sessions per assessed needs and concerns.   Plan:   Patient Instructions  Let's follow up in about 4 weeks- see appointment next page.   Good job placing sensor and starting it today  Your blood sugars are much improved.   Target blood sugar while sleeping is greater than 110 and less than 160.  Consider a decrease in your evening dose byt 2-4 units to achieve this on a regular basis,.  Good Job!  Tina [name] [phone]  Expected Outcomes:  Demonstrated interest in learning. Expect positive outcomes  Education material provided:  Diabetes Resources  If problems or questions, patient to contact team via:  Phone  Future DSME appointment: 4-6 wks Norm Parcel, RD 04/02/2023 9:12 AM.

## 2023-04-02 NOTE — Progress Notes (Signed)
    Patient presents for Depo Provera injection States feeling well Last injection received 12/09/22  Patient is overdue for injection. Urine pregnancy negative  Depo Provera given Subq left anterior thigh. Patient tolerated well.  Next injection due 8/22-07/09/23  Reminder card given  L. Jenafer Winterton, BSN, RN-BC

## 2023-04-13 ENCOUNTER — Other Ambulatory Visit (HOSPITAL_COMMUNITY): Payer: Self-pay

## 2023-04-13 ENCOUNTER — Other Ambulatory Visit: Payer: Self-pay | Admitting: Family Medicine

## 2023-04-14 ENCOUNTER — Encounter: Payer: Self-pay | Admitting: Dietician

## 2023-04-14 ENCOUNTER — Telehealth (HOSPITAL_COMMUNITY): Payer: Medicare Other | Admitting: Physician Assistant

## 2023-04-14 ENCOUNTER — Other Ambulatory Visit (HOSPITAL_COMMUNITY): Payer: Self-pay

## 2023-04-14 ENCOUNTER — Other Ambulatory Visit: Payer: Self-pay

## 2023-04-14 ENCOUNTER — Encounter (HOSPITAL_COMMUNITY): Payer: Self-pay | Admitting: Physician Assistant

## 2023-04-14 ENCOUNTER — Encounter (HOSPITAL_COMMUNITY): Payer: Self-pay

## 2023-04-14 DIAGNOSIS — F3181 Bipolar II disorder: Secondary | ICD-10-CM | POA: Diagnosis not present

## 2023-04-14 DIAGNOSIS — F411 Generalized anxiety disorder: Secondary | ICD-10-CM | POA: Diagnosis not present

## 2023-04-14 MED ORDER — AMITRIPTYLINE HCL 50 MG PO TABS
50.0000 mg | ORAL_TABLET | Freq: Every day | ORAL | 1 refills | Status: DC
Start: 2023-04-14 — End: 2023-05-26
  Filled 2023-04-14: qty 30, 30d supply, fill #0

## 2023-04-14 MED ORDER — NITROGLYCERIN 0.2 MG/HR TD PT24
MEDICATED_PATCH | TRANSDERMAL | 1 refills | Status: DC
  Filled 2023-04-14: qty 30, 90d supply, fill #0

## 2023-04-14 MED ORDER — GABAPENTIN 400 MG PO CAPS
400.0000 mg | ORAL_CAPSULE | Freq: Three times a day (TID) | ORAL | 1 refills | Status: DC
Start: 2023-04-14 — End: 2023-05-26
  Filled 2023-04-14: qty 90, 30d supply, fill #0

## 2023-04-14 MED ORDER — CARIPRAZINE HCL 3 MG PO CAPS
3.0000 mg | ORAL_CAPSULE | Freq: Every day | ORAL | 1 refills | Status: DC
Start: 2023-04-14 — End: 2023-05-26
  Filled 2023-04-14: qty 30, 30d supply, fill #0

## 2023-04-14 MED ORDER — BUSPIRONE HCL 30 MG PO TABS
30.0000 mg | ORAL_TABLET | Freq: Two times a day (BID) | ORAL | 1 refills | Status: DC
Start: 2023-04-14 — End: 2023-05-26
  Filled 2023-04-14 (×2): qty 60, 30d supply, fill #0

## 2023-04-14 MED ORDER — LAMOTRIGINE 100 MG PO TABS
100.0000 mg | ORAL_TABLET | Freq: Every day | ORAL | 1 refills | Status: DC
Start: 2023-04-14 — End: 2023-05-26
  Filled 2023-04-14: qty 30, 30d supply, fill #0

## 2023-04-14 NOTE — Progress Notes (Unsigned)
BH MD/PA/NP OP Progress Note  Virtual Visit via Video Note  I connected with Tina [name] on 04/14/23 at 10:30 AM EDT by a video enabled telemedicine application and verified that I am speaking with the correct person using two identifiers.  Location: Patient: Home Provider: Clinic   I discussed the limitations of evaluation and management by telemedicine and the availability of in person appointments. The patient expressed understanding and agreed to proceed.  Follow Up Instructions:   I discussed the assessment and treatment plan with the patient. The patient was provided an opportunity to ask questions and all were answered. The patient agreed with the plan and demonstrated an understanding of the instructions.   The patient was advised to call back or seek an in-person evaluation if the symptoms worsen or if the condition fails to improve as anticipated.  I provided 14 minutes of non-face-to-face time during this encounter.  Meta Hatchet, PA   04/14/2023 11:55 AM Tina [name]  MRN:  [id number]  Chief Complaint:  Chief Complaint  Patient presents with   Follow-up   Medication Management   HPI:   Tina [name] is a 52 year old, African-American female with a past psychiatric history significant for bipolar 2 disorder emphases major depressive episode), generalized anxiety disorder, insomnia, and tobacco use disorder who presents to [facility] via virtual video visit for follow-up and medication management.  Patient is currently being managed on the following psychiatric medications:  Vraylar 1.5 mg daily Gabapentin 400 mg 3 times daily Buspirone 30 mg 2 times daily Lamotrigine 100 mg daily  Patient denies experiencing any side effects through the use of her Vraylar; however, she reports that the medication is not doing anything significant in the management of her symptoms.  Patient continues  to endorse depression.  She reports that her mood is not good most days.  Patient rates her depression an 8 out of 10 with 10 being most severe.  She endorses depression most days of the week.  Patient endorses the following depressive symptoms: low mood, lack of motivation, decreased energy, and feeling overwhelmed.  Patient continues to endorse anxiety.  She reports that her anxiety is often accompanied by heart palpitations especially when thinking about her stressors.  A PHQ-9 screen was performed with the patient scoring an 18.  A GAD-7 screen was also performed the patient scoring a 16.  Patient is alert and oriented x 4, calm, cooperative, and fully engaged in conversation during the encounter.  Patient endorses okay mood.  Patient denies suicidal or homicidal ideations.  She further denies auditory or visual hallucinations and does not appear to be responding to internal/external stimuli.  Patient endorses poor sleep stating that she is up almost all night.  Patient endorses good appetite and eats on average 2 meals per day along with a snack.  Patient endorses alcohol consumption on occasion.  Patient endorses tobacco use and smokes on average 1/2 pack/day.  Patient denies illicit drug use.  Visit Diagnosis:    ICD-10-CM   1. Generalized anxiety disorder  F41.1 gabapentin (NEURONTIN) 400 MG capsule    busPIRone (BUSPAR) 30 MG tablet    amitriptyline (ELAVIL) 50 MG tablet    2. Bipolar 2 disorder, major depressive episode (HCC)  F31.81 cariprazine (VRAYLAR) 3 MG capsule    gabapentin (NEURONTIN) 400 MG capsule    lamoTRIgine (LAMICTAL) 100 MG tablet    amitriptyline (ELAVIL) 50 MG tablet       Past Psychiatric  History:  Insomnia Tobacco use Anxiety Depression Biplor 2 disorder  Past Medical History:  Past Medical History:  Diagnosis Date   Diabetes mellitus    type II   Fibroids    HTN (hypertension)    Hyperlipidemia    Iron deficiency anemia    Left acetabular fracture  (HCC)    Lumbar strain    Obesity    Ovarian cyst     Past Surgical History:  Procedure Laterality Date   FOOT ARTHRODESIS, MODIFIED MCBRIDE     right foot bunion surgery and ankle surgery 1980s   ORIF ACETABULAR FRACTURE     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    Family Psychiatric History:  Niece - Schizophrenia  Family History:  Family History  Problem Relation Age of Onset   Colon cancer Sister    Diabetes Mother    Diabetes Father    Diabetes Brother    Celiac disease Paternal Aunt     Social History:  Social History   Socioeconomic History   Marital status: Single    Spouse name: Not on file   Number of children: 0   Years of education: 12   Highest education level: Some college, no degree  Occupational History   Occupation: TEFL teacher: UNEMPLOYED    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, academic  Tobacco Use   Smoking status: Every Day    Packs/day: 0.50    Years: 23.00    Additional pack years: 0.00    Total pack years: 11.50    Types: Cigarettes   Smokeless tobacco: Never   Tobacco comments:    1/2PPD  Vaping Use   Vaping Use: Never used  Substance and Sexual Activity   Alcohol use: Yes    Alcohol/week: 0.0 standard drinks of alcohol    Comment: occ   Drug use: No   Sexual activity: Yes    Partners: Female    Birth control/protection: Injection    Comment: Depo  Other Topics Concern   Not on file  Social History Narrative   Homosexual; in a monogamous relationship w/ homosexual woman.   She is disabled.  She last worked in 2018 at [employer].    Right handed   One story home   Caffeine: 1 soda daily, occas drinks coffee    Social Determinants of Health   Financial Resource Strain: Low Risk  (03/05/2023)   Overall Financial Resource Strain (CARDIA)    Difficulty of Paying Living Expenses: Not very hard  Food Insecurity: Food Insecurity Present (03/05/2023)   Hunger Vital Sign    Worried About Running Out of Food in the Last Year: Sometimes true    Ran Out of  Food in the Last Year: Sometimes true  Transportation Needs: No Transportation Needs (03/05/2023)   PRAPARE - Administrator, Civil Service (Medical): No    Lack of Transportation (Non-Medical): No  Physical Activity: Insufficiently Active (03/05/2023)   Exercise Vital Sign    Days of Exercise per Week: 3 days    Minutes of Exercise per Session: 30 min  Stress: Stress Concern Present (03/05/2023)   [employer] of Occupational Health - Occupational Stress Questionnaire    Feeling of Stress : To some extent  Social Connections: Moderately Isolated (03/05/2023)   Social Connection and Isolation Panel [NHANES]    Frequency of Communication with Friends and Family: More than three times a week    Frequency of Social Gatherings with Friends and Family: Once a week  Attends Religious Services: 1 to 4 times per year    Active Member of Clubs or Organizations: No    Attends Banker Meetings: Never    Marital Status: Never married    Allergies:  Allergies  Allergen Reactions   Codeine Other (See Comments)    hallucinations   Sglt2 Inhibitors     Recurrent yeast infections on Jardiance, stopped 06/2022    Metabolic Disorder Labs: Lab Results  Component Value Date   HGBA1C 12.4 (H) 03/04/2023   MPG 200 05/25/2017   MPG 237 02/26/2016   No results found for: "PROLACTIN" Lab Results  Component Value Date   CHOL 164 03/11/2022   TRIG 195 (H) 03/11/2022   HDL 33 (L) 03/11/2022   CHOLHDL 5.0 (H) 03/11/2022   VLDL 50 (H) 08/21/2014   LDLCALC 97 03/11/2022   LDLCALC 94 08/08/2020   Lab Results  Component Value Date   TSH 1.074 06/27/2011    Therapeutic Level Labs: No results found for: "LITHIUM" No results found for: "VALPROATE" No results found for: "CBMZ"  Current Medications: Current Outpatient Medications  Medication Sig Dispense Refill   amitriptyline (ELAVIL) 50 MG tablet Take 1 tablet (50 mg total) by mouth at bedtime. 30 tablet 1    cariprazine (VRAYLAR) 3 MG capsule Take 1 capsule (3 mg total) by mouth daily. 30 capsule 1   amLODipine-olmesartan (AZOR) 5-20 MG tablet Take 1 tablet by mouth daily. 90 tablet 3   atorvastatin (LIPITOR) 40 MG tablet Take 1 tablet (40 mg total) by mouth daily. 100 tablet 2   blood glucose meter kit and supplies KIT Use up to 4 (four) times daily as directed 1 each 0   Blood Glucose Monitoring Suppl (CONTOUR NEXT ONE) KIT 1 each by Does not apply route See admin instructions. USE TO CHECK BLOOD GLUCOSE ONCE DAILY. IM PROGRAM. 1 kit 0   busPIRone (BUSPAR) 30 MG tablet Take 1 tablet (30 mg total) by mouth 2 (two) times daily. 60 tablet 1   Continuous Blood Gluc Sensor (FREESTYLE LIBRE 3 SENSOR) MISC Place 1 sensor on the skin every 14 days. Use to check glucose continuously 6 each 3   cyclobenzaprine (FLEXERIL) 5 MG tablet Take 1 tablet (5 mg total) by mouth at bedtime as needed for muscle spasms. 30 tablet 1   gabapentin (NEURONTIN) 400 MG capsule Take 1 capsule (400 mg total) by mouth 3 (three) times daily. 90 capsule 1   glucose blood (CONTOUR NEXT TEST) test strip Please use to check your sugar 3 times a day. 100 strip 3   glucose blood test strip Use up to 4 (four) times daily as directed 100 each 0   ibuprofen (ADVIL) 800 MG tablet Take 1 tablet (800 mg total) by mouth every 8 (eight) hours as needed. 30 tablet 0   Insulin Lispro Prot & Lispro (HUMALOG 75/25 MIX) (75-25) 100 UNIT/ML Kwikpen Inject 28 Units into the skin 2 (two) times daily. 15 mL 11   Insulin Lispro Prot & Lispro (HUMALOG MIX 75/25 KWIKPEN) (75-25) 100 UNIT/ML Kwikpen Inject 28 Units into the skin 2 (two) times daily before a meal. 15 mL 11   Insulin Pen Needle 32G X 4 MM MISC Use new pen needle with each insulin injection two times daily 200 each 4   lamoTRIgine (LAMICTAL) 100 MG tablet Take 1 tablet (100 mg total) by mouth daily. 30 tablet 1   Lancets (ONETOUCH DELICA PLUS LANCET33G) MISC Use up to 4 (four) times daily as  directed 100 each 0   Lancets MISC 1 Units by Does not apply route 3 (three) times daily. 100 each 3   metFORMIN (GLUCOPHAGE) 1000 MG tablet Take 1 tablet (1,000 mg total) by mouth 2 (two) times daily with a meal. 180 tablet 1   nicotine polacrilex (NICORETTE) 4 MG gum Use 1 piece of gum (4 mg total) by mouth as needed for smoking cessation. 110 tablet 3   nitroGLYCERIN (NITRO-DUR) 0.2 mg/hr patch Cut patch into one - fourth pieces. Place a one-fourth (1/4) piece of patch on skin over affected area, changing to a new piece every 24 hours. 30 patch 1   Semaglutide, 2 MG/DOSE, (OZEMPIC, 2 MG/DOSE,) 8 MG/3ML SOPN Inject 2 mg into the skin once a week. 3 mL 3   No current facility-administered medications for this visit.     Musculoskeletal: Strength & Muscle Tone: Unable to assess due to telemedicine visit Gait & Station: Unable to assess due to telemedicine visit Patient leans: Unable to assess due to telemedicine visit  Psychiatric Specialty Exam: Review of Systems  Psychiatric/Behavioral:  Positive for sleep disturbance. Negative for decreased concentration, dysphoric mood, hallucinations, self-injury and suicidal ideas. The patient is nervous/anxious. The patient is not hyperactive.     There were no vitals taken for this visit.There is no height or weight on file to calculate BMI.  General Appearance: Casual  Eye Contact:  Good  Speech:  Clear and Coherent and Normal Rate  Volume:  Normal  Mood:  Anxious and Depressed  Affect:  Congruent  Thought Process:  Coherent, Goal Directed, and Descriptions of Associations: Intact  Orientation:  Full (Time, Place, and Person)  Thought Content: WDL   Suicidal Thoughts:  No  Homicidal Thoughts:  No  Memory:  Immediate;   Good Recent;   Good Remote;   Good  Judgement:  Good  Insight:  Good  Psychomotor Activity:  Normal  Concentration:  Concentration: Good and Attention Span: Good  Recall:  Good  Fund of Knowledge: Good  Language: Good   Akathisia:  No  Handed:  Right  AIMS (if indicated): not done  Assets:  Communication Skills Desire for Improvement Financial Resources/Insurance Housing Social Support  ADL's:  Intact  Cognition: WNL  Sleep:  Poor   Screenings: GAD-7    Flowsheet Row Video Visit from 04/14/2023 in [facility] Video Visit from 03/04/2023 in [facility] Video Visit from 01/14/2023 in [facility] Video Visit from 12/03/2022 in [facility] Video Visit from 08/07/2022 in [facility]  Total GAD-7 Score 16 17 17 18 19       PHQ2-9    Flowsheet Row Video Visit from 04/14/2023 in [facility] Most recent reading at 04/14/2023 10:37 AM Clinical Support from 03/05/2023 in [facility] Most recent reading at 03/05/2023  9:10 AM Video Visit from 03/04/2023 in [facility] Most recent reading at 03/04/2023 11:16 AM Office Visit from 03/04/2023 in [facility] Most recent reading at 03/04/2023 11:08 AM Video Visit from 01/14/2023 in [facility] Most recent reading at 01/14/2023 11:15 AM  PHQ-2 Total Score 5 0 5 0 5  PHQ-9 Total Score 18 -- 19 -- 16      Flowsheet Row Video Visit from 04/14/2023 in [facility] Video Visit from 03/04/2023 in [facility] Video Visit  from 01/14/2023 in [facility]  C-SSRS RISK CATEGORY No Risk No Risk No Risk        Assessment and Plan:   Tina [name] is a 52 year old, African-American female with a past psychiatric history significant for bipolar 2 disorder emphases major depressive episode), generalized anxiety disorder, insomnia, and tobacco use disorder who presents to [facility] via  virtual video visit for follow-up and medication management.  Patient continues to endorse depressive episodes most days of the week.  In addition to her depression, patient continues to endorse anxiety.  Patient also reports that she has been receiving very little sleep and states that she spends most of her nights awake.  Although she denies experiencing any adverse side effects from the use of her Vraylar, patient states that the medication has done very little to manage her depressive symptoms or anxiety.  Provider recommended increasing her Vraylar from 1.5 mg to 3 mg daily for mood stability.  Provider also recommended placing patient on amitriptyline 50 mg at bedtime for the management of her depressive symptoms and for sleep.  Patient was agreeable to recommendations.  Patient's medications to be prescribed to pharmacy of choice.  Provider allow for time at the end of the encounter to discuss potential adverse side effects to patient's current medication regimen.  Patient vocalized understanding.  Collaboration of Care: Collaboration of Care: Medication Management AEB provider managing patient's psychiatric medications, Primary Care Provider AEB patient being seen by primary care provider at [facility], Psychiatrist AEB patient being seen by a mental health provider at this facility, Other provider involved in patient's care AEB patient being seen by Podiatry, and Referral or follow-up with counselor/therapist AEB patient being seen by a licensed clinical social worker at this facility  Patient/Guardian was advised Release of Information must be obtained prior to any record release in order to collaborate their care with an outside provider. Patient/Guardian was advised if they have not already done so to contact the registration department to sign all necessary forms in order for [place] to release information regarding their care.   Consent: Patient/Guardian gives verbal  consent for treatment and assignment of benefits for services provided during this visit. Patient/Guardian expressed understanding and agreed to proceed.   1. Generalized anxiety disorder  - gabapentin (NEURONTIN) 400 MG capsule; Take 1 capsule (400 mg total) by mouth 3 (three) times daily.  Dispense: 90 capsule; Refill: 1 - busPIRone (BUSPAR) 30 MG tablet; Take 1 tablet (30 mg total) by mouth 2 (two) times daily.  Dispense: 60 tablet; Refill: 1 - amitriptyline (ELAVIL) 50 MG tablet; Take 1 tablet (50 mg total) by mouth at bedtime.  Dispense: 30 tablet; Refill: 1  2. Bipolar 2 disorder, major depressive episode (HCC)  - cariprazine (VRAYLAR) 3 MG capsule; Take 1 capsule (3 mg total) by mouth daily.  Dispense: 30 capsule; Refill: 1 - gabapentin (NEURONTIN) 400 MG capsule; Take 1 capsule (400 mg total) by mouth 3 (three) times daily.  Dispense: 90 capsule; Refill: 1 - lamoTRIgine (LAMICTAL) 100 MG tablet; Take 1 tablet (100 mg total) by mouth daily.  Dispense: 30 tablet; Refill: 1 - amitriptyline (ELAVIL) 50 MG tablet; Take 1 tablet (50 mg total) by mouth at bedtime.  Dispense: 30 tablet; Refill: 1  Patient to follow-up in 6 weeks Provider spent a total of 14 minutes with the patient/reviewing patient's chart  Meta Hatchet, PA 04/14/2023, 11:55 AM

## 2023-04-16 ENCOUNTER — Other Ambulatory Visit (HOSPITAL_COMMUNITY): Payer: Self-pay

## 2023-04-16 ENCOUNTER — Encounter (HOSPITAL_COMMUNITY): Payer: Self-pay

## 2023-04-17 ENCOUNTER — Other Ambulatory Visit (HOSPITAL_COMMUNITY): Payer: Self-pay

## 2023-04-17 ENCOUNTER — Ambulatory Visit (INDEPENDENT_AMBULATORY_CARE_PROVIDER_SITE_OTHER): Payer: Medicare Other | Admitting: Student

## 2023-04-17 ENCOUNTER — Telehealth: Payer: Self-pay | Admitting: *Deleted

## 2023-04-17 DIAGNOSIS — E1165 Type 2 diabetes mellitus with hyperglycemia: Secondary | ICD-10-CM

## 2023-04-17 DIAGNOSIS — Z7985 Long-term (current) use of injectable non-insulin antidiabetic drugs: Secondary | ICD-10-CM | POA: Diagnosis not present

## 2023-04-17 DIAGNOSIS — Z794 Long term (current) use of insulin: Secondary | ICD-10-CM | POA: Diagnosis not present

## 2023-04-17 MED ORDER — INSULIN LISPRO PROT & LISPRO (75-25 MIX) 100 UNIT/ML KWIKPEN
28.0000 [IU] | PEN_INJECTOR | Freq: Two times a day (BID) | SUBCUTANEOUS | 11 refills | Status: DC
Start: 2023-04-17 — End: 2023-08-12
  Filled 2023-04-17 (×2): qty 15, 27d supply, fill #0
  Filled 2023-05-10: qty 15, 27d supply, fill #1
  Filled 2023-06-02: qty 15, 27d supply, fill #2
  Filled 2023-07-05: qty 15, 27d supply, fill #3
  Filled 2023-08-02: qty 15, 27d supply, fill #4

## 2023-04-17 MED ORDER — METFORMIN HCL 1000 MG PO TABS
1000.0000 mg | ORAL_TABLET | Freq: Two times a day (BID) | ORAL | 0 refills | Status: DC
Start: 2023-04-17 — End: 2023-07-07
  Filled 2023-04-17 – 2023-06-02 (×2): qty 60, 30d supply, fill #0

## 2023-04-17 NOTE — Telephone Encounter (Signed)
Patient called in stating she went to p/u meds yesterday and was told her [facility] Part D ended 5/31. 6 Rxs would cost > $1000. She is completely out of insulins. She was advised to follow through with  signing back up with  [facility] Part D, and also to go to [facility] to reapply for MCD.  She was given tele appt this PM to discuss alternatives for insulins.

## 2023-04-17 NOTE — Progress Notes (Cosign Needed Addendum)
  [facility] Telephone Encounter Continuity Care Appointment  HPI:  This telephone encounter was created for [name] on 04/17/2023 for the following purpose/cc: medication refill. Patient has ran out of Humalog 75/25 and ozempic. Her Medicare Part D expired and thus having to pay OOP for medications. She has been speaking with social services and has another visit this upcoming Monday, hopeful that Part D will restart in July.  I discussed that we can send over her insulin and metformin to [facility] for discounted price given she does not currently have insurance to cover medications. Unfortunately, will need to hold off on ozempic as this will still be very expensive even through [facility]. She is willing to go to [facility] to pick up insulin and metformin.  No other complaints today.   Past Medical History:  Past Medical History:  Diagnosis Date   Diabetes mellitus    type II   Fibroids    HTN (hypertension)    Hyperlipidemia    Iron deficiency anemia    Left acetabular fracture (HCC)    Lumbar strain    Obesity    Ovarian cyst      ROS:  Negative aside from that in HPI.   Assessment / Plan / Recommendations:  Please see A&P under problem oriented charting for assessment of the patient's acute and chronic medical conditions.  As always, pt is advised that if symptoms worsen or new symptoms arise, they should go to an urgent care facility or to to ER for further evaluation.   Consent and Medical Decision Making:  Patient discussed with Dr. Cleda Daub This is a telephone encounter between [facility] and Merrilyn Puma on 04/17/2023 for medication refill. The visit was conducted with the patient located at home and Merrilyn Puma at [facility]. The patient's identity was confirmed using their DOB and current address. The patient has consented to being evaluated through a telephone encounter and  understands the associated risks (an examination cannot be done and the patient may need to come in for an appointment) / benefits (allows the patient to remain at home, decreasing exposure to coronavirus). I personally spent 16 minutes on medical discussion.

## 2023-04-17 NOTE — Telephone Encounter (Signed)
Spoke with Lyla Son at [facility] at [facility]. States they do not usually do the 70/25 on IM Program but can do for this month only. It will be $10 copay. Unfortunately, Ozempic is too expensive to do on IM Program, but they do have Victoza on IM Program.

## 2023-04-17 NOTE — Addendum Note (Signed)
Addended by: Merrilyn Puma on: 04/17/2023 02:48 PM   Modules accepted: Level of Service

## 2023-04-20 ENCOUNTER — Other Ambulatory Visit (HOSPITAL_COMMUNITY): Payer: Self-pay

## 2023-04-23 ENCOUNTER — Telehealth (HOSPITAL_COMMUNITY): Payer: Self-pay | Admitting: *Deleted

## 2023-04-23 NOTE — Telephone Encounter (Signed)
Patient states that her Medicare pharmacy coverage lapsed and she did new enrollment but it will not be active until July 1st. She is asking if she could get samples of any of her medications to get her through this month.

## 2023-04-24 ENCOUNTER — Ambulatory Visit (INDEPENDENT_AMBULATORY_CARE_PROVIDER_SITE_OTHER): Payer: Medicare Other | Admitting: Clinical

## 2023-04-24 DIAGNOSIS — F3181 Bipolar II disorder: Secondary | ICD-10-CM

## 2023-04-24 NOTE — Progress Notes (Unsigned)
THERAPIST PROGRESS NOTE Virtual Visit via Video Note  I connected with Tina [name] on 04/24/2023 at  8:00 AM EDT by a video enabled telemedicine application and verified that I am speaking with the correct person using two identifiers.  Location: Patient: home Provider: office   I discussed the limitations of evaluation and management by telemedicine and the availability of in person appointments. The patient expressed understanding and agreed to proceed.   Follow Up Instructions: I discussed the assessment and treatment plan with the patient. The patient was provided an opportunity to ask questions and all were answered. The patient agreed with the plan and demonstrated an understanding of the instructions.   The patient was advised to call back or seek an in-person evaluation if the symptoms worsen or if the condition fails to improve as anticipated.   Session Time: 40 minutes  Participation Level: Active  Behavioral Response: CasualAlertDepressed  Type of Therapy: Individual Therapy  Treatment Goals addressed:   ProgressTowards Goals: Progressing  Interventions: CBT and Supportive  Summary:  Tina [name] is a 52 y.o. female who presents for the scheduled appointment oriented times five, appropriately dressed and friendly. Client denied hallucinations and delusions. Client reported on today things have been bittersweet. Client reported she has been content with spending her time with her brothers but also has been depressed. Client reported she goes to her older brothers house on the weekends to get a change of scenery. Client reported she does not deal with her extended family due to their dynamic. Client reported her family has not acted like family since her mother passed 2 years ago. Client reported she and her brother hosted a fathers day cookout and family she hadn't seen came by for a minute and didn't stay long. Client reported her niece  also attended and informed her that her, the clients best friend had recently married. Client reported she thought to herself why he friend did not reach and tell her. Client reported she let it go because its nothing she can do to change it and she has not done anything to her friend. Client reported otherwise she needed help getting her health medications due to a lapse in her insurance. Client reported she also has not been able to get her psych  medications. Client reported she has been having auditory hallucinations. Client reported the hallucinations are not causing S.I and telling her to do harmful things. Evidence of progress towards goal:  client reported 1 positive which is enforcing emotional and physical boundaries to help alleviate depressive triggers in her environment.  Suicidal/Homicidal: Nowithout intent/plan  Therapist Response:  Therapist began the appointment asking the client how she has been doing. Therapist used cbt to engage using active listening and positive emotional support. Therapist used cbt to engage and ask the client about current severity of depressive symptoms compared to medication compliance. Therapist used CBT to ask her open ended questions about sources of negative emotions and normalize her appropriate responses. Therapist used cbt to reinforce her practice of reframing negative thoughts. Therapist used CBT ask the client to identify her progress with frequency of use with coping skills with continued practice in her daily activity.    Therapist assigned the client homework to practice self care.   Plan: Return again in 5 weeks.  Diagnosis: bipolar 2 disorder, major depressive episode  Collaboration of Care: Patient refused AEB none requested by the client.  Patient/Guardian was advised Release of Information must be obtained prior to any  record release in order to collaborate their care with an outside provider. Patient/Guardian was advised if they have  not already done so to contact the registration department to sign all necessary forms in order for [place] to release information regarding their care.   Consent: Patient/Guardian gives verbal consent for treatment and assignment of benefits for services provided during this visit. Patient/Guardian expressed understanding and agreed to proceed.   Neena Rhymes Zayquan Bogard, LCSW 04/24/2023

## 2023-04-24 NOTE — Telephone Encounter (Signed)
Message acknowledged and reviewed.  Provider to provide patient with a 21-day supply of her Vraylar prescription.

## 2023-04-29 ENCOUNTER — Ambulatory Visit (INDEPENDENT_AMBULATORY_CARE_PROVIDER_SITE_OTHER): Payer: Medicare Other | Admitting: Dietician

## 2023-04-29 DIAGNOSIS — Z794 Long term (current) use of insulin: Secondary | ICD-10-CM | POA: Diagnosis not present

## 2023-04-29 DIAGNOSIS — E1165 Type 2 diabetes mellitus with hyperglycemia: Secondary | ICD-10-CM | POA: Diagnosis not present

## 2023-04-29 NOTE — Patient Instructions (Addendum)
Glad you got your insulin!   When your ozempic kicks in, you might want to decrease your nighttime insulin to prevent low blood sugar.  Reminding you to schedule you eye exam and dental exam.  Next A1c will be due August 1 - you can schedule with your doctor after June 04, 2023  Be sure you have Medicare xtra help [phone]  ([email or web address] that is the same as LIS)  Keep up the great work controlling your blood sugar.   Let's meet every 3 months same day as your doctor.  Tina [name] [phone]

## 2023-04-29 NOTE — Progress Notes (Signed)
Scored 4 on the DDS2, so DDS17 was given to patient to take home to complete. She was asked to return uit at her follow up visit.  Diabetes Distress: Consider the degree to which each of these two items may have distressed or bothered you DURING THE PAST MONTH and circle the appropriate number:  Not a problem A slight problem A moderate problem Somewhat serious problem A serious problem A very serious problem  Feeling overwhelmed by the demands of living with diabetes   3     Feeling that I am often failing with my diabetes routine 1       Diabetes Self-Management Education  Visit Type: Annual Follow-Up  Appt. Start Time: 815 Appt. End Time: 855  04/29/2023  Tina [name], identified by name and date of birth, is a 52 y.o. female with a diagnosis of Diabetes:  .   ASSESSMENT  Blood sugar well controlled on only insulin twice daily. She reports not having an appetite despite not taking Ozempic.   CGM Results from download:   % Time CGM active:   93 %   (Goal >70%)  Average glucose:   148 mg/dL for 14 days  Glucose management indicator:   6.9 %  Time in range (70-180 mg/dL):   86 %   (Goal >40%)  Time High (181-250 mg/dL):   14 %   (Goal < 98%)  Time Very High (>250 mg/dL):    0 %   (Goal < 5%)  Time Low (54-69 mg/dL):   0 %   (Goal <1%)  Time Very Low (<54 mg/dL):   0 %   (Goal <1%)  Coefficient of variation:   20.5 %   (Goal <36%)   CGM 14 day average are gradually increasing: 125>140>144>148. Denies need for refills, supplies or medicines.   Lab Results  Component Value Date   HGBA1C 12.4 (H) 03/04/2023   HGBA1C 12.0 (A) 03/04/2023   HGBA1C 8.3 (A) 10/16/2022   HGBA1C 7.4 (A) 06/25/2022   HGBA1C 7.4 (A) 01/14/2022       Diabetes Self-Management Education - 04/29/23 1300       Patient Education   Monitoring Taught/evaluated CGM (comment)   discussed CGM reports with patient, having symptomatic hypoglycemia but not interested in decreasing her insulin to prevent  these.     Individualized Goals (developed by patient)   Monitoring  Consistenly use CGM      Patient Self-Evaluation of Goals - Patient rates self as meeting previously set goals (% of time)   Monitoring >75% (most of the time)      Post-Education Assessment   Patient undertands incorporating physical activity into lifestyle. Comprehends key points    Patient understands monitoring blood glucose, interpreting and using results Comprehends key points      Outcomes   Expected Outcomes Demonstrated interest in learning. Expect positive outcomes    Future DMSE 3-4 months    Program Status Completed      Subsequent Visit   Since your last visit have you continued or begun to take your medications as prescribed? No   yes taking insulin, but not taking Ozempic due to loss of insurance until July 1            Individualized Plan for Diabetes Self-Management Training:   Learning Objective:  Patient will have a greater understanding of diabetes self-management. Patient education plan is to attend individual and/or group sessions per assessed needs and concerns.   Plan:   Patient  Instructions  Glad you got your insulin!   When your ozempic kicks in, you might want to decrease your nighttime insulin to prevent low blood sugar.  Reminding you to schedule you eye exam and dental exam.  Next A1c will be due August 1 - you can schedule with your doctor after June 04, 2023  Be sure you have Medicare xtra help [phone]  ([email or web address] that is the same as LIS)  Keep up the great work controlling your blood sugar.   Let's meet every 3 months same day as your doctor.  Tina [name] [phone]     Expected Outcomes:  Demonstrated interest in learning. Expect positive outcomes  Education material provided: Diabetes Resources  If problems or questions, patient to contact team via:  Phone  Future DSME appointment: 3-4 months Norm Parcel,  RD 04/29/2023 1:54 PM.

## 2023-05-11 ENCOUNTER — Other Ambulatory Visit (HOSPITAL_COMMUNITY): Payer: Self-pay

## 2023-05-26 ENCOUNTER — Other Ambulatory Visit (HOSPITAL_COMMUNITY): Payer: Self-pay

## 2023-05-26 ENCOUNTER — Encounter (HOSPITAL_COMMUNITY): Payer: Self-pay | Admitting: Physician Assistant

## 2023-05-26 ENCOUNTER — Telehealth (INDEPENDENT_AMBULATORY_CARE_PROVIDER_SITE_OTHER): Payer: Medicare Other | Admitting: Physician Assistant

## 2023-05-26 DIAGNOSIS — F17210 Nicotine dependence, cigarettes, uncomplicated: Secondary | ICD-10-CM | POA: Diagnosis not present

## 2023-05-26 DIAGNOSIS — F3181 Bipolar II disorder: Secondary | ICD-10-CM

## 2023-05-26 DIAGNOSIS — F411 Generalized anxiety disorder: Secondary | ICD-10-CM | POA: Diagnosis not present

## 2023-05-26 DIAGNOSIS — F17200 Nicotine dependence, unspecified, uncomplicated: Secondary | ICD-10-CM

## 2023-05-26 MED ORDER — NICOTINE POLACRILEX 4 MG MT GUM
8.0000 mg | CHEWING_GUM | OROMUCOSAL | 2 refills | Status: AC | PRN
Start: 2023-05-26 — End: ?
  Filled 2023-05-26: qty 110, 15d supply, fill #0

## 2023-05-26 MED ORDER — LAMOTRIGINE 100 MG PO TABS
100.0000 mg | ORAL_TABLET | Freq: Every day | ORAL | 1 refills | Status: AC
Start: 2023-05-26 — End: ?
  Filled 2023-05-26 – 2023-06-05 (×2): qty 30, 30d supply, fill #0
  Filled 2023-07-05: qty 30, 30d supply, fill #1

## 2023-05-26 MED ORDER — GABAPENTIN 400 MG PO CAPS
400.0000 mg | ORAL_CAPSULE | Freq: Three times a day (TID) | ORAL | 1 refills | Status: AC
Start: 2023-05-26 — End: ?
  Filled 2023-05-26 – 2023-06-05 (×2): qty 90, 30d supply, fill #0
  Filled 2023-07-05: qty 90, 30d supply, fill #1

## 2023-05-26 MED ORDER — LURASIDONE HCL 20 MG PO TABS
20.0000 mg | ORAL_TABLET | Freq: Every day | ORAL | 1 refills | Status: DC
Start: 2023-05-26 — End: 2023-07-07
  Filled 2023-05-26 – 2023-06-05 (×2): qty 30, 30d supply, fill #0
  Filled 2023-07-05: qty 30, 30d supply, fill #1

## 2023-05-26 MED ORDER — AMITRIPTYLINE HCL 50 MG PO TABS
50.0000 mg | ORAL_TABLET | Freq: Every day | ORAL | 1 refills | Status: DC
Start: 2023-05-26 — End: 2023-07-07
  Filled 2023-05-26 – 2023-06-05 (×2): qty 30, 30d supply, fill #0
  Filled 2023-07-05: qty 30, 30d supply, fill #1

## 2023-05-26 MED ORDER — BUSPIRONE HCL 30 MG PO TABS
30.0000 mg | ORAL_TABLET | Freq: Two times a day (BID) | ORAL | 1 refills | Status: DC
Start: 2023-05-26 — End: 2023-07-07
  Filled 2023-05-26 – 2023-06-05 (×2): qty 60, 30d supply, fill #0
  Filled 2023-07-05: qty 60, 30d supply, fill #1

## 2023-05-26 NOTE — Progress Notes (Signed)
BH MD/PA/NP OP Progress Note  Virtual Visit via Video Note  I connected with Tina [name] on 05/26/23 at 10:30 AM EDT by a video enabled telemedicine application and verified that I am speaking with the correct person using two identifiers.  Location: Patient: Home Provider: Clinic   I discussed the limitations of evaluation and management by telemedicine and the availability of in person appointments. The patient expressed understanding and agreed to proceed.  Follow Up Instructions:   I discussed the assessment and treatment plan with the patient. The patient was provided an opportunity to ask questions and all were answered. The patient agreed with the plan and demonstrated an understanding of the instructions.   The patient was advised to call back or seek an in-person evaluation if the symptoms worsen or if the condition fails to improve as anticipated.  I provided 20 minutes of non-face-to-face time during this encounter.  Meta Hatchet, PA   05/26/2023 12:09 PM Nakia Remmers  MRN:  [id number]  Chief Complaint:  No chief complaint on file.  HPI:   Tina [name] is a 52 year old, African-American female with a past psychiatric history significant for bipolar 2 disorder emphases major depressive episode), generalized anxiety disorder, insomnia, and tobacco use disorder who presents to [facility] via virtual video visit for follow-up and medication management.  Patient is currently being managed on the following psychiatric medications:  Vraylar 1.5 mg daily Gabapentin 400 mg 3 times daily Buspirone 30 mg 2 times daily Lamotrigine 100 mg daily  Patient presents today encounter stating that she has not noticed any significant difference from the use of her Vraylar.  Patient continues to endorse some fatigue in addition to her depressive symptoms.  Patient also notes that she has not been wanting  to do much of anything.  Patient rates her depression as 8 out of 10 with 10 being most severe.  Patient endorses depressive episodes every day with symptoms occasionally lasting the whole day.  Patient endorses the following depressive symptoms: lack of motivation, feelings of sadness, irritability, and decreased energy.  Patient denies feelings of guilt/worthlessness or hopelessness.  Patient also endorses anxiety and rates her anxiety a 7 or 8 out of 10.  Patient denies any new stressors at this time.  In addition to managing her depression and anxiety, patient would also like to be manage for her tobacco use.  A PHQ-9 screen was performed with the patient scoring an 18.  A GAD-7 screen was also performed with the patient scoring an 18.  Patient is alert and oriented x 4, calm, cooperative, and fully engaged in conversation during the encounter.  Patient endorses okay mood.  Patient denies suicidal or homicidal ideations.  She further denies auditory or visual hallucinations and does not appear to be responding to internal soft external stimuli.  Patient endorses poor sleep and receives on average 1 to 2 hours of sleep per night.  Patient endorses decreased appetite and eats on average 1 meal per day.  Patient endorses alcohol consumption on occasion.  Patient endorses tobacco use and smokes on average 1/2 pack/day.  Patient denies illicit drug use.  Visit Diagnosis:    ICD-10-CM   1. Bipolar 2 disorder, major depressive episode (HCC)  F31.81 lurasidone (LATUDA) 20 MG TABS tablet    amitriptyline (ELAVIL) 50 MG tablet    gabapentin (NEURONTIN) 400 MG capsule    lamoTRIgine (LAMICTAL) 100 MG tablet    2. Tobacco use disorder  F17.200 nicotine polacrilex (NICORETTE) 4 MG gum    3. Generalized anxiety disorder  F41.1 busPIRone (BUSPAR) 30 MG tablet    amitriptyline (ELAVIL) 50 MG tablet    gabapentin (NEURONTIN) 400 MG capsule       Past Psychiatric History:  Insomnia Tobacco  use Anxiety Depression Biplor 2 disorder  Past Medical History:  Past Medical History:  Diagnosis Date   Diabetes mellitus    type II   Fibroids    HTN (hypertension)    Hyperlipidemia    Iron deficiency anemia    Left acetabular fracture (HCC)    Lumbar strain    Obesity    Ovarian cyst     Past Surgical History:  Procedure Laterality Date   FOOT ARTHRODESIS, MODIFIED MCBRIDE     right foot bunion surgery and ankle surgery 1980s   ORIF ACETABULAR FRACTURE     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    Family Psychiatric History:  Niece - Schizophrenia  Family History:  Family History  Problem Relation Age of Onset   Colon cancer Sister    Diabetes Mother    Diabetes Father    Diabetes Brother    Celiac disease Paternal Aunt     Social History:  Social History   Socioeconomic History   Marital status: Single    Spouse name: Not on file   Number of children: 0   Years of education: 12   Highest education level: Some college, no degree  Occupational History   Occupation: TEFL teacher: UNEMPLOYED    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, academic  Tobacco Use   Smoking status: Every Day    Current packs/day: 0.50    Average packs/day: 0.5 packs/day for 23.0 years (11.5 ttl pk-yrs)    Types: Cigarettes   Smokeless tobacco: Never   Tobacco comments:    1/2PPD  Vaping Use   Vaping status: Never Used  Substance and Sexual Activity   Alcohol use: Yes    Alcohol/week: 0.0 standard drinks of alcohol    Comment: occ   Drug use: No   Sexual activity: Yes    Partners: Female    Birth control/protection: Injection    Comment: Depo  Other Topics Concern   Not on file  Social History Narrative   Homosexual; in a monogamous relationship w/ homosexual woman.   She is disabled.  She last worked in 2018 at [employer].    Right handed   One story home   Caffeine: 1 soda daily, occas drinks coffee    Social Determinants of Health   Financial Resource Strain: Low Risk  (03/05/2023)    Overall Financial Resource Strain (CARDIA)    Difficulty of Paying Living Expenses: Not very hard  Food Insecurity: Food Insecurity Present (03/05/2023)   Hunger Vital Sign    Worried About Running Out of Food in the Last Year: Sometimes true    Ran Out of Food in the Last Year: Sometimes true  Transportation Needs: No Transportation Needs (03/05/2023)   PRAPARE - Administrator, Civil Service (Medical): No    Lack of Transportation (Non-Medical): No  Physical Activity: Insufficiently Active (03/05/2023)   Exercise Vital Sign    Days of Exercise per Week: 3 days    Minutes of Exercise per Session: 30 min  Stress: Stress Concern Present (03/05/2023)   [employer] of Occupational Health - Occupational Stress Questionnaire    Feeling of Stress : To some extent  Social Connections: Moderately Isolated (03/05/2023)  Social Advertising account executive [NHANES]    Frequency of Communication with Friends and Family: More than three times a week    Frequency of Social Gatherings with Friends and Family: Once a week    Attends Religious Services: 1 to 4 times per year    Active Member of [employer] or Organizations: No    Attends Banker Meetings: Never    Marital Status: Never married    Allergies:  Allergies  Allergen Reactions   Codeine Other (See Comments)    hallucinations   Sglt2 Inhibitors     Recurrent yeast infections on Jardiance, stopped 06/2022    Metabolic Disorder Labs: Lab Results  Component Value Date   HGBA1C 12.4 (H) 03/04/2023   MPG 200 05/25/2017   MPG 237 02/26/2016   No results found for: "PROLACTIN" Lab Results  Component Value Date   CHOL 164 03/11/2022   TRIG 195 (H) 03/11/2022   HDL 33 (L) 03/11/2022   CHOLHDL 5.0 (H) 03/11/2022   VLDL 50 (H) 08/21/2014   LDLCALC 97 03/11/2022   LDLCALC 94 08/08/2020   Lab Results  Component Value Date   TSH 1.074 06/27/2011    Therapeutic Level Labs: No results found for: "LITHIUM" No  results found for: "VALPROATE" No results found for: "CBMZ"  Current Medications: Current Outpatient Medications  Medication Sig Dispense Refill   lurasidone (LATUDA) 20 MG TABS tablet Take 1 tablet (20 mg total) by mouth daily. 30 tablet 1   amitriptyline (ELAVIL) 50 MG tablet Take 1 tablet (50 mg total) by mouth at bedtime. 30 tablet 1   amLODipine-olmesartan (AZOR) 5-20 MG tablet Take 1 tablet by mouth daily. 90 tablet 3   atorvastatin (LIPITOR) 40 MG tablet Take 1 tablet (40 mg total) by mouth daily. 100 tablet 2   blood glucose meter kit and supplies KIT Use up to 4 (four) times daily as directed 1 each 0   Blood Glucose Monitoring Suppl (CONTOUR NEXT ONE) KIT 1 each by Does not apply route See admin instructions. USE TO CHECK BLOOD GLUCOSE ONCE DAILY. IM PROGRAM. 1 kit 0   busPIRone (BUSPAR) 30 MG tablet Take 1 tablet (30 mg total) by mouth 2 (two) times daily. 60 tablet 1   Continuous Blood Gluc Sensor (FREESTYLE LIBRE 3 SENSOR) MISC Place 1 sensor on the skin every 14 days. Use to check glucose continuously 6 each 3   cyclobenzaprine (FLEXERIL) 5 MG tablet Take 1 tablet (5 mg total) by mouth at bedtime as needed for muscle spasms. 30 tablet 1   gabapentin (NEURONTIN) 400 MG capsule Take 1 capsule (400 mg total) by mouth 3 (three) times daily. 90 capsule 1   glucose blood (CONTOUR NEXT TEST) test strip Please use to check your sugar 3 times a day. 100 strip 3   glucose blood test strip Use up to 4 (four) times daily as directed 100 each 0   ibuprofen (ADVIL) 800 MG tablet Take 1 tablet (800 mg total) by mouth every 8 (eight) hours as needed. 30 tablet 0   Insulin Lispro Prot & Lispro (HUMALOG 75/25 MIX) (75-25) 100 UNIT/ML Kwikpen Inject 28 Units into the skin 2 (two) times daily. 15 mL 11   Insulin Lispro Prot & Lispro (HUMALOG MIX 75/25 KWIKPEN) (75-25) 100 UNIT/ML Kwikpen Inject 28 Units into the skin 2 (two) times daily before a meal. 15 mL 11   Insulin Pen Needle 32G X 4 MM MISC Use  new pen needle with each insulin injection  two times daily 200 each 4   lamoTRIgine (LAMICTAL) 100 MG tablet Take 1 tablet (100 mg total) by mouth daily. 30 tablet 1   Lancets (ONETOUCH DELICA PLUS LANCET33G) MISC Use up to 4 (four) times daily as directed 100 each 0   Lancets MISC 1 Units by Does not apply route 3 (three) times daily. 100 each 3   metFORMIN (GLUCOPHAGE) 1000 MG tablet Take 1 tablet (1,000 mg total) by mouth 2 (two) times daily with a meal. 60 tablet 0   nicotine polacrilex (NICORETTE) 4 MG gum Chew 2 pieces (8 mg total) by mouth as needed for smoking cessation. 110 tablet 2   nitroGLYCERIN (NITRO-DUR) 0.2 mg/hr patch Cut patch into one - fourth pieces. Place a one-fourth (1/4) piece of patch on skin over affected area, changing to a new piece every 24 hours. 30 patch 1   Semaglutide, 2 MG/DOSE, (OZEMPIC, 2 MG/DOSE,) 8 MG/3ML SOPN Inject 2 mg into the skin once a week. 3 mL 3   No current facility-administered medications for this visit.     Musculoskeletal: Strength & Muscle Tone: Unable to assess due to telemedicine visit Gait & Station: Unable to assess due to telemedicine visit Patient leans: Unable to assess due to telemedicine visit  Psychiatric Specialty Exam: Review of Systems  Psychiatric/Behavioral:  Positive for sleep disturbance. Negative for decreased concentration, dysphoric mood, hallucinations, self-injury and suicidal ideas. The patient is nervous/anxious. The patient is not hyperactive.     There were no vitals taken for this visit.There is no height or weight on file to calculate BMI.  General Appearance: Casual  Eye Contact:  Good  Speech:  Clear and Coherent and Normal Rate  Volume:  Normal  Mood:  Anxious and Depressed  Affect:  Congruent  Thought Process:  Coherent, Goal Directed, and Descriptions of Associations: Intact  Orientation:  Full (Time, Place, and Person)  Thought Content: WDL   Suicidal Thoughts:  No  Homicidal Thoughts:  No   Memory:  Immediate;   Good Recent;   Good Remote;   Good  Judgement:  Good  Insight:  Good  Psychomotor Activity:  Normal  Concentration:  Concentration: Good and Attention Span: Good  Recall:  Good  Fund of Knowledge: Good  Language: Good  Akathisia:  No  Handed:  Right  AIMS (if indicated): not done  Assets:  Communication Skills Desire for Improvement Financial Resources/Insurance Housing Social Support  ADL's:  Intact  Cognition: WNL  Sleep:  Poor   Screenings: GAD-7    Flowsheet Row Video Visit from 05/26/2023 in [facility] Video Visit from 04/14/2023 in [facility] Video Visit from 03/04/2023 in [facility] Video Visit from 01/14/2023 in [facility] Video Visit from 12/03/2022 in [facility]  Total GAD-7 Score 18 16 17 17 18       PHQ2-9    Flowsheet Row Video Visit from 05/26/2023 in [facility] Most recent reading at 05/26/2023 10:44 AM Video Visit from 04/14/2023 in [facility] Most recent reading at 04/14/2023 10:37 AM Clinical Support from 03/05/2023 in [facility] Most recent reading at 03/05/2023  9:10 AM Video Visit from 03/04/2023 in [facility] Most recent reading at 03/04/2023 11:16 AM Office Visit from 03/04/2023 in [facility] Most recent reading at 03/04/2023 11:08 AM  PHQ-2 Total Score 6 5 0 5  0  PHQ-9 Total Score 18 18 -- 19 --      Flowsheet Row Video Visit from 05/26/2023 in [facility] Video Visit from 04/14/2023 in [facility] Video Visit from 03/04/2023 in [facility]  C-SSRS RISK CATEGORY No Risk No Risk No Risk        Assessment and Plan:   Tina [name] is a 52 year old, African-American  female with a past psychiatric history significant for bipolar 2 disorder (major depressive episode), generalized anxiety disorder, insomnia, and tobacco use disorder who presents to [facility] via virtual video visit for follow-up and medication management.  Patient presents to the encounter stating that her Leafy Kindle has been minimally effective in managing her depressive symptoms.  Patient also continues to endorse ongoing anxiety.  Patient would also like help in managing her tobacco use.  Patient has been on the following psychiatric medications: Vraylar, Abilify, Lybalvi, and Seroquel.  Patient shows some concern regarding weight gain through the use of antipsychotics.  Provider recommended Latuda 20 mg daily for the management of her depressive symptoms and for mood stability.  Patient was agreeable to recommendation.  In regards to her tobacco use, provider recommended patient be placed back on nicotine (polacrilex) gum 8 mg as needed for the management of her tobacco use disorder.  Patient was agreeable to recommendation.  Patient's medications to be prescribed to pharmacy of choice.  Provider allowed for time at the end of the encounter to discuss potential adverse side effects to patient's medication regimen.  Patient vocalized understanding.  Collaboration of Care: Collaboration of Care: Medication Management AEB provider managing patient's psychiatric medications, Primary Care Provider AEB patient being seen by primary care provider at [facility], Psychiatrist AEB patient being seen by a mental health provider at this facility, Other provider involved in patient's care AEB patient being seen by Podiatry, and Referral or follow-up with counselor/therapist AEB patient being seen by a licensed clinical social worker at this facility  Patient/Guardian was advised Release of Information must be obtained prior to any record release in  order to collaborate their care with an outside provider. Patient/Guardian was advised if they have not already done so to contact the registration department to sign all necessary forms in order for [place] to release information regarding their care.   Consent: Patient/Guardian gives verbal consent for treatment and assignment of benefits for services provided during this visit. Patient/Guardian expressed understanding and agreed to proceed.   1. Tobacco use disorder  - nicotine polacrilex (NICORETTE) 4 MG gum; Chew 2 pieces (8 mg total) by mouth as needed for smoking cessation.  Dispense: 110 tablet; Refill: 2  2. Bipolar 2 disorder, major depressive episode (HCC)  - lurasidone (LATUDA) 20 MG TABS tablet; Take 1 tablet (20 mg total) by mouth daily.  Dispense: 30 tablet; Refill: 1 - amitriptyline (ELAVIL) 50 MG tablet; Take 1 tablet (50 mg total) by mouth at bedtime.  Dispense: 30 tablet; Refill: 1 - gabapentin (NEURONTIN) 400 MG capsule; Take 1 capsule (400 mg total) by mouth 3 (three) times daily.  Dispense: 90 capsule; Refill: 1 - lamoTRIgine (LAMICTAL) 100 MG tablet; Take 1 tablet (100 mg total) by mouth daily.  Dispense: 30 tablet; Refill: 1  3. Generalized anxiety disorder  - busPIRone (BUSPAR) 30 MG tablet; Take 1 tablet (30 mg total) by mouth 2 (two) times daily.  Dispense: 60 tablet; Refill: 1 - amitriptyline (ELAVIL) 50  MG tablet; Take 1 tablet (50 mg total) by mouth at bedtime.  Dispense: 30 tablet; Refill: 1 - gabapentin (NEURONTIN) 400 MG capsule; Take 1 capsule (400 mg total) by mouth 3 (three) times daily.  Dispense: 90 capsule; Refill: 1  Patient to follow-up in 6 weeks Provider spent a total of 20 minutes with the patient/reviewing patient's chart  Meta Hatchet, PA 05/26/2023, 12:09 PM

## 2023-05-28 ENCOUNTER — Other Ambulatory Visit (HOSPITAL_COMMUNITY): Payer: Self-pay

## 2023-05-28 ENCOUNTER — Ambulatory Visit (INDEPENDENT_AMBULATORY_CARE_PROVIDER_SITE_OTHER): Payer: Medicare Other | Admitting: Podiatry

## 2023-05-28 DIAGNOSIS — Q828 Other specified congenital malformations of skin: Secondary | ICD-10-CM

## 2023-05-28 DIAGNOSIS — M19071 Primary osteoarthritis, right ankle and foot: Secondary | ICD-10-CM

## 2023-05-28 DIAGNOSIS — E1149 Type 2 diabetes mellitus with other diabetic neurological complication: Secondary | ICD-10-CM | POA: Diagnosis not present

## 2023-05-28 DIAGNOSIS — M7751 Other enthesopathy of right foot: Secondary | ICD-10-CM

## 2023-05-28 MED ORDER — IBUPROFEN 800 MG PO TABS
800.0000 mg | ORAL_TABLET | Freq: Three times a day (TID) | ORAL | 0 refills | Status: DC | PRN
  Filled 2023-05-28: qty 30, 10d supply, fill #0

## 2023-05-29 ENCOUNTER — Ambulatory Visit: Payer: 59 | Admitting: Podiatry

## 2023-05-31 NOTE — Progress Notes (Signed)
Subjective: Chief Complaint  Patient presents with   Callouses    Callus bilat - Diabetic    52 year old female presents the office today for concerns of painful calluses on both of her feet.  Still gets ongoing pain to the ankle which is chronic.  She thinks is gotten worse over the last day or so because of the weather.  No injuries.   Last A1c was 12.4 on Mar 04, 2023  Mercie Eon, MD Last seen: October 16, 2022   Objective: AAO x3, NAD DP/PT pulses palpable bilaterally, CRT less than 3 seconds Significant bunions are present bilateral with hammertoes deformities bilaterally.  Flatfoot is present bilaterally.  Hyperkeratotic lesion sulcus of the right first interspace, left submetatarsal 2 as well as fifth metatarsal head on the left foot.  Minimal callus on the medial aspect the left second toe.  There is no underlying ulceration drainage or any signs of infection.  There is no ulcerations. Mild diffuse discomfort of the ankle with mild swelling.  No area of pinpoint tenderness.  Crepitus present.  Previous triple arthrodesis. No pain with calf compression, swelling, warmth, erythema  Assessment: Right ankle arthritis, flatfoot; hyperkeratotic lesions  Plan: -All treatment options discussed with the patient including all alternatives, risks, complications.  -Sharply debrided hyperkeratotic lesions x 4 without any complications or bleeding -Offered steroid injection of the ankle today.  Ibuprofen as needed.  Continue bracing. -Daily foot inspection, glucose control. -Patient encouraged to call the office with any questions, concerns, change in symptoms.   Vivi Barrack DPM

## 2023-06-02 ENCOUNTER — Other Ambulatory Visit (HOSPITAL_COMMUNITY): Payer: Self-pay

## 2023-06-02 ENCOUNTER — Other Ambulatory Visit: Payer: Self-pay

## 2023-06-03 ENCOUNTER — Other Ambulatory Visit (HOSPITAL_COMMUNITY): Payer: Self-pay

## 2023-06-05 ENCOUNTER — Other Ambulatory Visit (HOSPITAL_COMMUNITY): Payer: Self-pay

## 2023-06-11 ENCOUNTER — Ambulatory Visit (INDEPENDENT_AMBULATORY_CARE_PROVIDER_SITE_OTHER): Payer: Medicare Other | Admitting: Clinical

## 2023-06-11 DIAGNOSIS — F3181 Bipolar II disorder: Secondary | ICD-10-CM | POA: Diagnosis not present

## 2023-06-11 NOTE — Progress Notes (Signed)
THERAPIST PROGRESS NOTE Virtual Visit via Video Note  I connected with Tina [name] on 06/11/23 at  8:00 AM EDT by a video enabled telemedicine application and verified that I am speaking with the correct person using two identifiers.  Location: Patient: home Provider: office   I discussed the limitations of evaluation and management by telemedicine and the availability of in person appointments. The patient expressed understanding and agreed to proceed.   Follow Up Instructions: I discussed the assessment and treatment plan with the patient. The patient was provided an opportunity to ask questions and all were answered. The patient agreed with the plan and demonstrated an understanding of the instructions.   The patient was advised to call back or seek an in-person evaluation if the symptoms worsen or if the condition fails to improve as anticipated.   Session Time: 25 minutes  Participation Level: Active  Behavioral Response: CasualAlertDepressed  Type of Therapy: Individual Therapy  Treatment Goals addressed: Reduce frequency, intensity, and duration of depression symptoms as evidenced by: the clients self 1x per session   ProgressTowards Goals: Progressing  Interventions: CBT and Supportive  Summary:  Tina [name] is a 52 y.o. female who presents for the scheduled appointment oriented times five, appropriately dressed and friendly. Client denied hallucinations and delusions. Client reported she has been doing fairly well. Client reported she since she got her insurance reinstated she was able to get her psych medications. Client reported she had her medications adjusted because she was depressed everyday. Client reported with the new regimen she does not feel depressed everyday. Client reported she has days when she has energy and others such as today when she feels "blah" not have the energy to be talkative. Client reported she and her  brother went to put flowers on her mother grave this past weekend. Client reported she celebrated her birthday at home with her brothers. Client reported her rotator is still bothering her with some pain. Client reported she is going to tell her doctor about it at her next appointment.  Evidence of progress towards goal:  client pHQ9 score is down by 2 points. Flowsheet Row Counselor from 06/11/2023 in [facility]  PHQ-9 Total Score 16       Suicidal/Homicidal: Nowithout intent/plan  Therapist Response:  Therapist began the appointment asking the client how she has been doing since last seen. Therapist used CBT to engage using active listening and positive emotional support. Therapist used CBT to ask the client bout medication compliance compared to ongoing symptoms. Therapist used CBT to engage and encourage positive engagement in interaction with supports and activity within physical means around the house. Therapist used CBT ask the client to identify her progress with frequency of use with coping skills with continued practice in her daily activity.    Therapist assigned the client homework to practice self care.   Plan: Return again in 4 weeks.  Diagnosis: bipolar 2 disorder, major depressive episode  Collaboration of Care: Patient refused AEB none requested by the client.  Patient/Guardian was advised Release of Information must be obtained prior to any record release in order to collaborate their care with an outside provider. Patient/Guardian was advised if they have not already done so to contact the registration department to sign all necessary forms in order for [place] to release information regarding their care.   Consent: Patient/Guardian gives verbal consent for treatment and assignment of benefits for services provided during this visit. Patient/Guardian expressed understanding and  agreed to proceed.   Neena Rhymes , LCSW 06/11/2023

## 2023-06-26 ENCOUNTER — Ambulatory Visit (HOSPITAL_COMMUNITY): Payer: Medicare Other | Admitting: Clinical

## 2023-06-26 DIAGNOSIS — F3181 Bipolar II disorder: Secondary | ICD-10-CM | POA: Diagnosis not present

## 2023-06-26 NOTE — Progress Notes (Signed)
THERAPIST PROGRESS NOTE Virtual Visit via Video Note  I connected with Tina [name] on 06/26/23 at  8:00 AM EDT by a video enabled telemedicine application and verified that I am speaking with the correct person using two identifiers.  Location: Patient: home Provider: office   I discussed the limitations of evaluation and management by telemedicine and the availability of in person appointments. The patient expressed understanding and agreed to proceed.   Follow Up Instructions: I discussed the assessment and treatment plan with the patient. The patient was provided an opportunity to ask questions and all were answered. The patient agreed with the plan and demonstrated an understanding of the instructions.   The patient was advised to call back or seek an in-person evaluation if the symptoms worsen or if the condition fails to improve as anticipated.   Session Time: 25 minutes  Participation Level: Active  Behavioral Response: CasualAlertDepressed  Type of Therapy: Individual Therapy  Treatment Goals addressed: client will reduce frequency, intensity and duration of depressive symptoms as evidenced by the client self report 1x per session  ProgressTowards Goals: Progressing  Interventions: CBT and Supportive  Summary:  Tina [name] is a 51 y.o. female who presents for scheduled appointment oriented x 5, appropriately dressed, and friendly.  Client denied hallucinations and delusions. Client reported on today she has been feeling down and depressed this past week. Client reported dealing with loneliness. Client reported she has associated friends but no one that she would trust to pick up the phone and talk to her about the things that are on her mind. Client reported the close friend that she did have left they state and has not contacted her again.  Client reported she also has been feeling locked in the house because she needs to get her car  repaired.  Client reported she is going to her brother's house this weekend so her nephew can work on her car.  Client reported she usually passes the time by playing games on her phone, watching TV, or sometimes going for a drive.  Client reported she did have an associated pop up at her house which she does not like.  Client reported this person showed up and did not ask how she was doing but wanted to shoulder something that was not significant.  Client reported otherwise she is doing well with medication management and feels that her recent regimen is working very well for her. Evidence of progress towards goal:  client reported 1 positive of engaging activities at least 3 days out of 7.    Suicidal/Homicidal: Nowithout intent/plan  Therapist Response:  Therapist began the appointment asking the client how she has been doing since last seen. Therapist used CBT to engage using active listening and positive emotional support. Therapist used CBT to engage and ask the client about severity of her depressive symptoms and the origin. Therapist used CBT to normalize her emotions within reason. Therapist used CBT to positively reinforce her engagement in positive behavioral activity. Therapist used CBT ask the client to identify her progress with frequency of use with coping skills with continued practice in her daily activity.    Therapist assigned the client homework to practice self care.   Plan: Return again in 4 weeks.  Diagnosis: bipolar 2 disorder, major depressive disorder episode  Collaboration of Care: Patient refused AEB none requested by the client.  Patient/Guardian was advised Release of Information must be obtained prior to any record release in order to collaborate  their care with an outside provider. Patient/Guardian was advised if they have not already done so to contact the registration department to sign all necessary forms in order for [place] to release information regarding their  care.   Consent: Patient/Guardian gives verbal consent for treatment and assignment of benefits for services provided during this visit. Patient/Guardian expressed understanding and agreed to proceed.   Neena Rhymes Elia Nunley, LCSW 06/26/2023

## 2023-07-01 ENCOUNTER — Ambulatory Visit (INDEPENDENT_AMBULATORY_CARE_PROVIDER_SITE_OTHER): Payer: Medicare Other | Admitting: *Deleted

## 2023-07-01 DIAGNOSIS — D259 Leiomyoma of uterus, unspecified: Secondary | ICD-10-CM | POA: Diagnosis not present

## 2023-07-01 DIAGNOSIS — D219 Benign neoplasm of connective and other soft tissue, unspecified: Secondary | ICD-10-CM

## 2023-07-01 MED ORDER — MEDROXYPROGESTERONE ACETATE 104 MG/0.65ML ~~LOC~~ SUSY
104.0000 mg | PREFILLED_SYRINGE | Freq: Once | SUBCUTANEOUS | Status: AC
Start: 2023-07-01 — End: 2023-07-01
  Administered 2023-07-01: 104 mg via SUBCUTANEOUS

## 2023-07-05 ENCOUNTER — Other Ambulatory Visit: Payer: Self-pay

## 2023-07-07 ENCOUNTER — Other Ambulatory Visit (HOSPITAL_COMMUNITY): Payer: Self-pay

## 2023-07-07 ENCOUNTER — Telehealth: Payer: Self-pay | Admitting: Internal Medicine

## 2023-07-07 ENCOUNTER — Encounter (HOSPITAL_COMMUNITY): Payer: Self-pay | Admitting: Physician Assistant

## 2023-07-07 ENCOUNTER — Other Ambulatory Visit: Payer: Self-pay

## 2023-07-07 ENCOUNTER — Telehealth (INDEPENDENT_AMBULATORY_CARE_PROVIDER_SITE_OTHER): Payer: Medicare Other | Admitting: Physician Assistant

## 2023-07-07 DIAGNOSIS — E1165 Type 2 diabetes mellitus with hyperglycemia: Secondary | ICD-10-CM

## 2023-07-07 DIAGNOSIS — F3181 Bipolar II disorder: Secondary | ICD-10-CM

## 2023-07-07 DIAGNOSIS — F17210 Nicotine dependence, cigarettes, uncomplicated: Secondary | ICD-10-CM | POA: Diagnosis not present

## 2023-07-07 DIAGNOSIS — F411 Generalized anxiety disorder: Secondary | ICD-10-CM | POA: Diagnosis not present

## 2023-07-07 DIAGNOSIS — G4700 Insomnia, unspecified: Secondary | ICD-10-CM | POA: Diagnosis not present

## 2023-07-07 MED ORDER — AMITRIPTYLINE HCL 25 MG PO TABS
25.0000 mg | ORAL_TABLET | Freq: Every day | ORAL | 1 refills | Status: DC
Start: 2023-07-07 — End: 2023-09-29
  Filled 2023-07-07 – 2023-08-02 (×2): qty 30, 30d supply, fill #0
  Filled 2023-09-03: qty 30, 30d supply, fill #1

## 2023-07-07 MED ORDER — GABAPENTIN 400 MG PO CAPS
400.0000 mg | ORAL_CAPSULE | Freq: Three times a day (TID) | ORAL | 1 refills | Status: DC
Start: 2023-07-07 — End: 2023-10-09
  Filled 2023-07-07 – 2023-08-02 (×2): qty 90, 30d supply, fill #0
  Filled 2023-09-03 – 2023-09-27 (×2): qty 90, 30d supply, fill #1

## 2023-07-07 MED ORDER — LURASIDONE HCL 40 MG PO TABS
40.0000 mg | ORAL_TABLET | Freq: Every day | ORAL | 1 refills | Status: DC
Start: 2023-07-07 — End: 2023-09-29
  Filled 2023-07-07 – 2023-08-02 (×2): qty 30, 30d supply, fill #0
  Filled 2023-09-03: qty 30, 30d supply, fill #1

## 2023-07-07 MED ORDER — LAMOTRIGINE 100 MG PO TABS
100.0000 mg | ORAL_TABLET | Freq: Every day | ORAL | 1 refills | Status: DC
Start: 2023-07-07 — End: 2023-09-27
  Filled 2023-07-07: qty 30, 30d supply, fill #0
  Filled 2023-08-02 – 2023-08-29 (×2): qty 30, 30d supply, fill #1

## 2023-07-07 MED ORDER — METFORMIN HCL 1000 MG PO TABS
1000.0000 mg | ORAL_TABLET | Freq: Two times a day (BID) | ORAL | 3 refills | Status: DC
Start: 2023-07-07 — End: 2023-08-12
  Filled 2023-07-07: qty 60, 30d supply, fill #0
  Filled 2023-08-02: qty 60, 30d supply, fill #1

## 2023-07-07 MED ORDER — BUSPIRONE HCL 30 MG PO TABS
30.0000 mg | ORAL_TABLET | Freq: Two times a day (BID) | ORAL | 1 refills | Status: DC
Start: 2023-07-07 — End: 2023-10-01
  Filled 2023-07-07: qty 60, 30d supply, fill #0
  Filled 2023-08-02 – 2023-08-29 (×2): qty 60, 30d supply, fill #1

## 2023-07-07 NOTE — Telephone Encounter (Signed)
Patient called after-hours pager stating that she needs a refill of her metformin. Sent in metformin 1000 mg BID to the [facility].   Elza Rafter, DO Internal Medicine Resident, PGY-3

## 2023-07-07 NOTE — Progress Notes (Signed)
BH MD/PA/NP OP Progress Note  Virtual Visit via Video Note  I connected with Tina [name] on 07/07/23 at 11:30 AM EDT by a video enabled telemedicine application and verified that I am speaking with the correct person using two identifiers.  Location: Patient: Home Provider: Clinic   I discussed the limitations of evaluation and management by telemedicine and the availability of in person appointments. The patient expressed understanding and agreed to proceed.  Follow Up Instructions:   I discussed the assessment and treatment plan with the patient. The patient was provided an opportunity to ask questions and all were answered. The patient agreed with the plan and demonstrated an understanding of the instructions.   The patient was advised to call back or seek an in-person evaluation if the symptoms worsen or if the condition fails to improve as anticipated.  I provided 23 minutes of non-face-to-face time during this encounter.  Meta Hatchet, PA   07/07/2023 4:01 PM Tina [name]  MRN:  [id number]  Chief Complaint:  Chief Complaint  Patient presents with   Follow-up   Medication Management   HPI:   Tina [name] is a 52 year old, African-American female with a past psychiatric history significant for bipolar 2 disorder emphases major depressive episode), generalized anxiety disorder, insomnia, and tobacco use disorder who presents to [facility] via virtual video visit for follow-up and medication management.  Patient is currently being managed on the following psychiatric medications:  Latuda 20 mg daily Amitriptyline 50 mg at bedtime Gabapentin 400 mg 3 times daily Buspirone 30 mg 2 times daily Lamotrigine 100 mg daily  Patient reports no issues or concerns regarding her current medication regimen.  Patient denies experiencing any adverse side effects at this time.  Since starting Latuda,  patient states that she has noticed a slight change in her mood.  She still continues to endorse depression and rates her depression as 7 out of 10 with 10 being most severe.  Patient endorses depressive episodes every other day and states on occasion she may experience depressive episodes every day.  Patient endorses the following depressive symptoms: feelings of sadness, lack of motivation, and irritability.  She denies decreased concentration, feelings of guilt/worthlessness, or hopelessness.  Patient also endorses some anxiety due to hearing voices characterized by demeaning voices saying things to her.  A PHQ-9 screen was performed scoring an 11.  A GAD-7 screen was also performed with the patient scoring a 13.  Patient is alert and oriented x 4, calm, cooperative, and fully engaged in conversation during the encounter.  Patient feels down stating that she does not feel like doing anything today and endorses fatigue.  Patient denies suicidal or homicidal ideations.  She further denies auditory or visual hallucinations and does not appear to be responding to internal/external stimuli.  Patient endorses poor sleep and receives on average 2 to 3 hours of sleep per night.  Patient endorses fair appetite and eats on average 2 meals per day.  Patient denies alcohol consumption or illicit drug use.  Patient endorses tobacco use and smokes on average 1/2 pack/day.  Visit Diagnosis:    ICD-10-CM   1. Bipolar 2 disorder, major depressive episode (HCC)  F31.81 lurasidone (LATUDA) 40 MG TABS tablet    amitriptyline (ELAVIL) 25 MG tablet    gabapentin (NEURONTIN) 400 MG capsule    lamoTRIgine (LAMICTAL) 100 MG tablet    2. Generalized anxiety disorder  F41.1 amitriptyline (ELAVIL) 25 MG tablet  gabapentin (NEURONTIN) 400 MG capsule    busPIRone (BUSPAR) 30 MG tablet       Past Psychiatric History:  Insomnia Tobacco use Anxiety Depression Biplor 2 disorder  Past Medical History:  Past Medical  History:  Diagnosis Date   Diabetes mellitus    type II   Fibroids    HTN (hypertension)    Hyperlipidemia    Iron deficiency anemia    Left acetabular fracture (HCC)    Lumbar strain    Obesity    Ovarian cyst     Past Surgical History:  Procedure Laterality Date   FOOT ARTHRODESIS, MODIFIED MCBRIDE     right foot bunion surgery and ankle surgery 1980s   ORIF ACETABULAR FRACTURE     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    Family Psychiatric History:  Niece - Schizophrenia  Family History:  Family History  Problem Relation Age of Onset   Colon cancer Sister    Diabetes Mother    Diabetes Father    Diabetes Brother    Celiac disease Paternal Aunt     Social History:  Social History   Socioeconomic History   Marital status: Single    Spouse name: Not on file   Number of children: 0   Years of education: 12   Highest education level: Some college, no degree  Occupational History   Occupation: TEFL teacher: UNEMPLOYED    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, academic  Tobacco Use   Smoking status: Every Day    Current packs/day: 0.50    Average packs/day: 0.5 packs/day for 23.0 years (11.5 ttl pk-yrs)    Types: Cigarettes   Smokeless tobacco: Never   Tobacco comments:    1/2PPD  Vaping Use   Vaping status: Never Used  Substance and Sexual Activity   Alcohol use: Yes    Alcohol/week: 0.0 standard drinks of alcohol    Comment: occ   Drug use: No   Sexual activity: Yes    Partners: Female    Birth control/protection: Injection    Comment: Depo  Other Topics Concern   Not on file  Social History Narrative   Homosexual; in a monogamous relationship w/ homosexual woman.   She is disabled.  She last worked in 2018 at [employer].    Right handed   One story home   Caffeine: 1 soda daily, occas drinks coffee    Social Determinants of Health   Financial Resource Strain: Low Risk  (03/05/2023)   Overall Financial Resource Strain (CARDIA)    Difficulty of Paying Living Expenses:  Not very hard  Food Insecurity: Food Insecurity Present (03/05/2023)   Hunger Vital Sign    Worried About Running Out of Food in the Last Year: Sometimes true    Ran Out of Food in the Last Year: Sometimes true  Transportation Needs: No Transportation Needs (03/05/2023)   PRAPARE - Administrator, Civil Service (Medical): No    Lack of Transportation (Non-Medical): No  Physical Activity: Insufficiently Active (03/05/2023)   Exercise Vital Sign    Days of Exercise per Week: 3 days    Minutes of Exercise per Session: 30 min  Stress: Stress Concern Present (03/05/2023)   [employer] of Occupational Health - Occupational Stress Questionnaire    Feeling of Stress : To some extent  Social Connections: Moderately Isolated (03/05/2023)   Social Connection and Isolation Panel [NHANES]    Frequency of Communication with Friends and Family: More than three times a week  Frequency of Social Gatherings with Friends and Family: Once a week    Attends Religious Services: 1 to 4 times per year    Active Member of [employer] or Organizations: No    Attends Banker Meetings: Never    Marital Status: Never married    Allergies:  Allergies  Allergen Reactions   Codeine Other (See Comments)    hallucinations   Sglt2 Inhibitors     Recurrent yeast infections on Jardiance, stopped 06/2022    Metabolic Disorder Labs: Lab Results  Component Value Date   HGBA1C 12.4 (H) 03/04/2023   MPG 200 05/25/2017   MPG 237 02/26/2016   No results found for: "PROLACTIN" Lab Results  Component Value Date   CHOL 164 03/11/2022   TRIG 195 (H) 03/11/2022   HDL 33 (L) 03/11/2022   CHOLHDL 5.0 (H) 03/11/2022   VLDL 50 (H) 08/21/2014   LDLCALC 97 03/11/2022   LDLCALC 94 08/08/2020   Lab Results  Component Value Date   TSH 1.074 06/27/2011    Therapeutic Level Labs: No results found for: "LITHIUM" No results found for: "VALPROATE" No results found for: "CBMZ"  Current  Medications: Current Outpatient Medications  Medication Sig Dispense Refill   lurasidone (LATUDA) 40 MG TABS tablet Take 1 tablet (40 mg total) by mouth daily with breakfast. 30 tablet 1   amitriptyline (ELAVIL) 25 MG tablet Take 1 tablet (25 mg total) by mouth at bedtime. 30 tablet 1   amLODipine-olmesartan (AZOR) 5-20 MG tablet Take 1 tablet by mouth daily. 90 tablet 3   atorvastatin (LIPITOR) 40 MG tablet Take 1 tablet (40 mg total) by mouth daily. 100 tablet 2   blood glucose meter kit and supplies KIT Use up to 4 (four) times daily as directed 1 each 0   Blood Glucose Monitoring Suppl (CONTOUR NEXT ONE) KIT 1 each by Does not apply route See admin instructions. USE TO CHECK BLOOD GLUCOSE ONCE DAILY. IM PROGRAM. 1 kit 0   busPIRone (BUSPAR) 30 MG tablet Take 1 tablet (30 mg total) by mouth 2 (two) times daily. 60 tablet 1   Continuous Blood Gluc Sensor (FREESTYLE LIBRE 3 SENSOR) MISC Place 1 sensor on the skin every 14 days. Use to check glucose continuously 6 each 3   cyclobenzaprine (FLEXERIL) 5 MG tablet Take 1 tablet (5 mg total) by mouth at bedtime as needed for muscle spasms. 30 tablet 1   gabapentin (NEURONTIN) 400 MG capsule Take 1 capsule (400 mg total) by mouth 3 (three) times daily. 90 capsule 1   glucose blood (CONTOUR NEXT TEST) test strip Please use to check your sugar 3 times a day. 100 strip 3   glucose blood test strip Use up to 4 (four) times daily as directed 100 each 0   ibuprofen (ADVIL) 800 MG tablet Take 1 tablet (800 mg total) by mouth every 8 (eight) hours as needed. 30 tablet 0   Insulin Lispro Prot & Lispro (HUMALOG 75/25 MIX) (75-25) 100 UNIT/ML Kwikpen Inject 28 Units into the skin 2 (two) times daily. 15 mL 11   Insulin Lispro Prot & Lispro (HUMALOG MIX 75/25 KWIKPEN) (75-25) 100 UNIT/ML Kwikpen Inject 28 Units into the skin 2 (two) times daily before a meal. 15 mL 11   Insulin Pen Needle 32G X 4 MM MISC Use new pen needle with each insulin injection two times  daily 200 each 4   lamoTRIgine (LAMICTAL) 100 MG tablet Take 1 tablet (100 mg total) by mouth daily. 30  tablet 1   Lancets (ONETOUCH DELICA PLUS LANCET33G) MISC Use up to 4 (four) times daily as directed 100 each 0   Lancets MISC 1 Units by Does not apply route 3 (three) times daily. 100 each 3   metFORMIN (GLUCOPHAGE) 1000 MG tablet Take 1 tablet (1,000 mg total) by mouth 2 (two) times daily with a meal. 60 tablet 3   nicotine polacrilex (NICORETTE) 4 MG gum Chew 2 pieces (8 mg total) by mouth as needed for smoking cessation. 110 tablet 2   nitroGLYCERIN (NITRO-DUR) 0.2 mg/hr patch Cut patch into one - fourth pieces. Place a one-fourth (1/4) piece of patch on skin over affected area, changing to a new piece every 24 hours. 30 patch 1   Semaglutide, 2 MG/DOSE, (OZEMPIC, 2 MG/DOSE,) 8 MG/3ML SOPN Inject 2 mg into the skin once a week. 3 mL 3   No current facility-administered medications for this visit.     Musculoskeletal: Strength & Muscle Tone: Unable to assess due to telemedicine visit Gait & Station: Unable to assess due to telemedicine visit Patient leans: Unable to assess due to telemedicine visit  Psychiatric Specialty Exam: Review of Systems  Psychiatric/Behavioral:  Positive for sleep disturbance. Negative for decreased concentration, dysphoric mood, hallucinations, self-injury and suicidal ideas. The patient is nervous/anxious. The patient is not hyperactive.     There were no vitals taken for this visit.There is no height or weight on file to calculate BMI.  General Appearance: Casual  Eye Contact:  Good  Speech:  Clear and Coherent and Normal Rate  Volume:  Normal  Mood:  Anxious and Depressed  Affect:  Congruent  Thought Process:  Coherent, Goal Directed, and Descriptions of Associations: Intact  Orientation:  Full (Time, Place, and Person)  Thought Content: WDL   Suicidal Thoughts:  No  Homicidal Thoughts:  No  Memory:  Immediate;   Good Recent;   Good Remote;   Good   Judgement:  Good  Insight:  Good  Psychomotor Activity:  Normal  Concentration:  Concentration: Good and Attention Span: Good  Recall:  Good  Fund of Knowledge: Good  Language: Good  Akathisia:  No  Handed:  Right  AIMS (if indicated): not done  Assets:  Communication Skills Desire for Improvement Financial Resources/Insurance Housing Social Support  ADL's:  Intact  Cognition: WNL  Sleep:  Poor   Screenings: GAD-7    Flowsheet Row Video Visit from 07/07/2023 in [facility] Video Visit from 05/26/2023 in [facility] Video Visit from 04/14/2023 in [facility] Video Visit from 03/04/2023 in [facility] Video Visit from 01/14/2023 in [facility]  Total GAD-7 Score 13 18 16 17 17       PHQ2-9    Flowsheet Row Video Visit from 07/07/2023 in [facility] from 06/11/2023 in [facility] Video Visit from 05/26/2023 in [facility] Video Visit from 04/14/2023 in [facility] Clinical Support from 03/05/2023 in [facility]  PHQ-2 Total Score 3 4 6 5  0  PHQ-9 Total Score 11 16 18 18  --      Flowsheet Row Video Visit from 07/07/2023 in [facility] Video Visit from 05/26/2023 in [facility] Video Visit from 04/14/2023 in [facility]  C-SSRS RISK CATEGORY Low Risk No Risk No Risk  Assessment and Plan:   Tina [name] is a 52 year old, African-American female with a past psychiatric history significant for bipolar 2 disorder (major depressive episode), generalized anxiety disorder, insomnia, and tobacco use disorder who presents to [facility] via virtual video visit for follow-up  and medication management. Patient presents to the encounter reporting no issues or concerns regarding her current medication regimen.  Since adding Latuda to her medication regimen, patient has noticed a slight change in her mood but continues to endorse ongoing depressive symptoms.  Patient also endorses anxiety attributed to hearing voices characterized by mean voices saying things to her.  Provider recommended increasing patient's particular dosage from 20 mg to 40 mg daily for the management of her depressive symptoms, auditory hallucinations, and for mood stability.    Patient also endorses issues with sleep.  Provider to decrease patient's amitriptyline dosage from 50 mg to 25 mg at bedtime for the management of her sleep.  Patient was agreeable to recommendation.  Patient's medications to be e-prescribed to pharmacy of choice.  Collaboration of Care: Collaboration of Care: Medication Management AEB provider managing patient's psychiatric medications, Primary Care Provider AEB patient being seen by primary care provider at [facility], Psychiatrist AEB patient being seen by a mental health provider at this facility, Other provider involved in patient's care AEB patient being seen by Podiatry, and Referral or follow-up with counselor/therapist AEB patient being seen by a licensed clinical social worker at this facility  Patient/Guardian was advised Release of Information must be obtained prior to any record release in order to collaborate their care with an outside provider. Patient/Guardian was advised if they have not already done so to contact the registration department to sign all necessary forms in order for [place] to release information regarding their care.   Consent: Patient/Guardian gives verbal consent for treatment and assignment of benefits for services provided during this visit. Patient/Guardian expressed understanding and agreed to proceed.   1. Bipolar 2  disorder, major depressive episode (HCC)  - lurasidone (LATUDA) 40 MG TABS tablet; Take 1 tablet (40 mg total) by mouth daily with breakfast.  Dispense: 30 tablet; Refill: 1 - amitriptyline (ELAVIL) 25 MG tablet; Take 1 tablet (25 mg total) by mouth at bedtime.  Dispense: 30 tablet; Refill: 1 - gabapentin (NEURONTIN) 400 MG capsule; Take 1 capsule (400 mg total) by mouth 3 (three) times daily.  Dispense: 90 capsule; Refill: 1 - lamoTRIgine (LAMICTAL) 100 MG tablet; Take 1 tablet (100 mg total) by mouth daily.  Dispense: 30 tablet; Refill: 1  2. Generalized anxiety disorder  - amitriptyline (ELAVIL) 25 MG tablet; Take 1 tablet (25 mg total) by mouth at bedtime.  Dispense: 30 tablet; Refill: 1 - gabapentin (NEURONTIN) 400 MG capsule; Take 1 capsule (400 mg total) by mouth 3 (three) times daily.  Dispense: 90 capsule; Refill: 1 - busPIRone (BUSPAR) 30 MG tablet; Take 1 tablet (30 mg total) by mouth 2 (two) times daily.  Dispense: 60 tablet; Refill: 1  Patient to follow-up in 6 weeks Provider spent a total of 23 minutes with the patient/reviewing patient's chart  Meta Hatchet, PA 07/07/2023, 4:01 PM

## 2023-07-08 ENCOUNTER — Other Ambulatory Visit (HOSPITAL_COMMUNITY): Payer: Self-pay

## 2023-07-10 ENCOUNTER — Ambulatory Visit (INDEPENDENT_AMBULATORY_CARE_PROVIDER_SITE_OTHER): Payer: Medicare Other | Admitting: Clinical

## 2023-07-10 DIAGNOSIS — F3181 Bipolar II disorder: Secondary | ICD-10-CM

## 2023-07-11 NOTE — Progress Notes (Signed)
   THERAPIST PROGRESS NOTE Virtual Visit via Video Note  I connected with Tina [name] on 07/10/2023 at  8:00 AM EDT by a video enabled telemedicine application and verified that I am speaking with the correct person using two identifiers.  Location: Patient: Home Provider: Office   I discussed the limitations of evaluation and management by telemedicine and the availability of in person appointments. The patient expressed understanding and agreed to proceed.   Follow Up Instructions: I discussed the assessment and treatment plan with the patient. The patient was provided an opportunity to ask questions and all were answered. The patient agreed with the plan and demonstrated an understanding of the instructions.   The patient was advised to call back or seek an in-person evaluation if the symptoms worsen or if the condition fails to improve as anticipated.   Session Time: 20 minutes  Participation Level: Active  Behavioral Response: CasualAlertDepressed  Type of Therapy: Individual Therapy  Treatment Goals addressed: Tina [name] WILL SCORE LESS THAN 10 ON THE PATIENT HEALTH QUESTIONNAIRE (PHQ-9)   ProgressTowards Goals: Progressing  Interventions: CBT and Supportive  Summary:  Tina [name] is a 52 y.o. female who presents for the scheduled appointment oriented x 5, appropriately dressed, and friendly.  Tina [name] denied hallucinations and delusions. Tina [name] reported on today she is doing about the same.  Tina [name] reported that her nephew was able to fix the Tina [name] but now she is experiencing other issues.  Tina [name] reported she has been spending her time at home with her brother.  Tina [name] reported she was invited by her friend to a social gathering but she decided not to go.  Tina [name] reported it was nice that she thought of her.  Tina [name] reported that the psychiatrist suggested her medications to help her with sleep.  Tina [name] reported she remembers her mother having  the same issue with not being able to sleep during the night and taking the same medications that she did. Evidence of progress towards goal: Tina [name] reported she is medication compliant 7 days a week. Flowsheet Row Video Visit from 07/07/2023 in [facility]  PHQ-9 Total Score 11       Suicidal/Homicidal: Nowithout intent/plan  Therapist Response:  Therapist began the appointment asking the Tina [name] how she has been doing since last seen. Therapist used CBT to engage using active listening and positive emotional support. Therapist used CBT to ask the Tina [name] to describe the severity of her depressive symptoms. Therapist used CBT to positively reinforce the clients practice of engaging in some kind of positive activity within means. Therapist used CBT ask the Tina [name] to identify her progress with frequency of use with coping skills with continued practice in her daily activity.       Plan: Return again in 4 weeks.  Diagnosis: Bipolar 2 major depressive episode  Collaboration of Care: Patient refused AEB none requested by the Tina [name].  Patient/Guardian was advised Release of Information must be obtained prior to any record release in order to collaborate their care with an outside provider. Patient/Guardian was advised if they have not already done so to contact the registration department to sign all necessary forms in order for [place] to release information regarding their care.   Consent: Patient/Guardian gives verbal consent for treatment and assignment of benefits for services provided during this visit. Patient/Guardian expressed understanding and agreed to proceed.   Neena Rhymes Ellen Goris, LCSW 07/10/2023

## 2023-07-24 ENCOUNTER — Ambulatory Visit (INDEPENDENT_AMBULATORY_CARE_PROVIDER_SITE_OTHER): Payer: Medicare Other | Admitting: Clinical

## 2023-07-24 DIAGNOSIS — F3181 Bipolar II disorder: Secondary | ICD-10-CM

## 2023-08-02 ENCOUNTER — Other Ambulatory Visit: Payer: Self-pay

## 2023-08-02 NOTE — Progress Notes (Signed)
   THERAPIST PROGRESS NOTE Virtual Visit via Video Note  I connected with Tina [name] on 07/24/2023 at  8:00 AM EDT by a video enabled telemedicine application and verified that I am speaking with the correct person using two identifiers.  Location: Patient: Home Provider: Office   I discussed the limitations of evaluation and management by telemedicine and the availability of in person appointments. The patient expressed understanding and agreed to proceed.   Follow Up Instructions: I discussed the assessment and treatment plan with the patient. The patient was provided an opportunity to ask questions and all were answered. The patient agreed with the plan and demonstrated an understanding of the instructions.   The patient was advised to call back or seek an in-person evaluation if the symptoms worsen or if the condition fails to improve as anticipated.   Session Time: 16 minutes  Participation Level: Active  Behavioral Response: CasualAlertDepressed  Type of Therapy: Individual Therapy  Treatment Goals addressed:  Reduce frequency, intensity, and duration of depression symptoms as evidenced by: the clients self 1x per session   ProgressTowards Goals: Progressing  Interventions: CBT and Supportive  Summary:  Tina [name] is a 52 y.o. female who presents for scheduled appointment oriented x 5, appropriately dressed, and friendly.  Client denied hallucinations and delusions. Client reported today she is doing about the same.  Client reported this past week has been slightly better than the depression she felt previously noted at her last therapy session.  Client reported she is doing well with the medication regimen.  Client reported it is helping some with AVH and severity of her mood.  Client reported this past week she did not have any doctors appointments so was nice that she did not have to be out and about as frequently for that  regard. Evidence of progress towards goal: Client reported medication compliant 7 days a week  Suicidal/Homicidal: Nowithout intent/plan  Therapist Response:  Therapist began the appointment asking the client how she has been doing since last seen. Therapist used CBT to engage using active listening and positive emotional support. Therapist used CBT to ask the client open-ended questions about the severity of negative emotions this week. Therapist used CBT to positively reinforce medication compliance and self-care. Therapist used CBT ask the client to identify her progress with frequency of use with coping skills with continued practice in her daily activity.       Plan: Return again in 4 weeks.  Diagnosis: Bipolar 2 disorder, major depressive disorder  Collaboration of Care: Patient refused AEB none requested by the client  Patient/Guardian was advised Release of Information must be obtained prior to any record release in order to collaborate their care with an outside provider. Patient/Guardian was advised if they have not already done so to contact the registration department to sign all necessary forms in order for [place] to release information regarding their care.   Consent: Patient/Guardian gives verbal consent for treatment and assignment of benefits for services provided during this visit. Patient/Guardian expressed understanding and agreed to proceed.   Tina [name] Tina Grindle, LCSW 07/24/2023

## 2023-08-03 ENCOUNTER — Other Ambulatory Visit (HOSPITAL_COMMUNITY): Payer: Self-pay

## 2023-08-03 ENCOUNTER — Other Ambulatory Visit: Payer: Self-pay

## 2023-08-04 ENCOUNTER — Other Ambulatory Visit (HOSPITAL_COMMUNITY): Payer: Self-pay

## 2023-08-04 ENCOUNTER — Other Ambulatory Visit: Payer: Self-pay | Admitting: Dietician

## 2023-08-04 DIAGNOSIS — E1169 Type 2 diabetes mellitus with other specified complication: Secondary | ICD-10-CM

## 2023-08-04 MED ORDER — OZEMPIC (2 MG/DOSE) 8 MG/3ML ~~LOC~~ SOPN
2.0000 mg | PEN_INJECTOR | SUBCUTANEOUS | 3 refills | Status: DC
  Filled 2023-08-04: qty 3, 28d supply, fill #0

## 2023-08-04 MED ORDER — FREESTYLE LIBRE 3 SENSOR MISC
3 refills | Status: DC
Start: 2023-08-04 — End: 2024-05-12
  Filled 2023-08-31: qty 6, 84d supply, fill #0
  Filled 2023-10-29 – 2023-11-03 (×3): qty 6, 84d supply, fill #1
  Filled 2024-01-04 – 2024-02-01 (×3): qty 6, 84d supply, fill #2
  Filled ????-??-??: fill #3

## 2023-08-04 NOTE — Telephone Encounter (Signed)
Freestyle Libre 3 sensor problem:  Sensor was activated from another device. So she threw them away. Cannot get more from her pharmacy. Asked her to call [facility] customer support to see if they would replace them. Will also request a refill.

## 2023-08-06 ENCOUNTER — Other Ambulatory Visit (HOSPITAL_COMMUNITY): Payer: Self-pay

## 2023-08-11 NOTE — Progress Notes (Unsigned)
[facility]: Clinic Note  Subjective:  History of Present Illness: Tina [name] is a 52 y.o. year old female who presents for routine follow up. I saw her in May, when A1C was up to 12.4.  A1C down to 7.4. CGM looks great.   Tina [name] wore the CGM for 14 days. The average reading was 143, % time in target was 91, % time below target was 0, and % time above target was. 9. Intervention will be to keep current management. The patient will be scheduled to see Dr Lafonda Mosses for a final appointment.     Please refer to Assessment and Plan below for full details in Problem-Based Charting.   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   Insomnia due to other mental disorder 03/18/2021   Generalized anxiety disorder 11/12/2020   Severe recurrent major depression with psychotic features (HCC) 10/04/2020   Bipolar 2 disorder, major depressive episode (HCC) 10/04/2020   Fibroids 04/14/2019   Seasonal allergies 02/06/2017   Osteoarthritis of right ankle 08/29/2016   Tobacco abuse 01/21/2016   Severe obesity with body mass index (BMI) of 35.0 to 39.9 with serious comorbidity (HCC) 10/12/2015   Ovarian cyst 12/24/2012   Hypertension 07/20/2012   Healthcare maintenance 06/27/2011   Type 2 diabetes mellitus (HCC) 05/30/2010   Dyslipidemia 05/30/2010   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 USE TO CHECK BLOOD GLUCOSE ONCE DAILY. IM PROGRAM., Disp: 1 kit, Rfl: 0   busPIRone (BUSPAR) 30 MG tablet, Take 1 tablet (30 mg total) by mouth 2 (two) times daily., Disp: 60 tablet, Rfl: 1   Continuous Glucose Sensor (FREESTYLE LIBRE 3 SENSOR) MISC, Place 1 sensor on the skin every 14 days. Use to check glucose continuously, Disp: 6 each, Rfl: 3   cyclobenzaprine (FLEXERIL) 5 MG tablet, Take 1 tablet (5 mg total) by mouth at bedtime as needed for muscle spasms., Disp: 30 tablet, Rfl: 1   gabapentin (NEURONTIN) 400 MG capsule, Take 1 capsule (400 mg total) by mouth 3 (three) times daily., Disp: 90 capsule, Rfl: 1   glucose blood (CONTOUR NEXT TEST) test strip, Please use to check your sugar 3 times a day., Disp: 100 strip, Rfl: 3   glucose blood test strip, Use up to 4 (four) times daily as  directed, Disp: 100 each, Rfl: 0   ibuprofen (ADVIL) 800 MG tablet, Take 1 tablet (800 mg total) by mouth every 8 (eight) hours as needed., Disp: 30 tablet, Rfl: 0   Insulin Lispro Prot & Lispro (HUMALOG MIX 75/25 KWIKPEN) (75-25) 100 UNIT/ML Kwikpen, Inject 28 Units into the skin 2 (two) times daily before a meal., Disp: 18 mL, Rfl: 2   Insulin Pen Needle 32G X 4 MM MISC, Use new pen needle with each insulin injection two times daily, Disp: 200 each, Rfl: 4   lamoTRIgine (LAMICTAL) 100 MG tablet, Take 1 tablet (100 mg total) by mouth daily., Disp: 30  tablet, Rfl: 1   Lancets (ONETOUCH DELICA PLUS LANCET33G) MISC, Use up to 4 (four) times daily as directed, Disp: 100 each, Rfl: 0   Lancets MISC, 1 Units by Does not apply route 3 (three) times daily., Disp: 100 each, Rfl: 3   lurasidone (LATUDA) 40 MG TABS tablet, Take 1 tablet (40 mg total) by mouth daily with breakfast., Disp: 30 tablet, Rfl: 1   metFORMIN (GLUCOPHAGE) 1000 MG tablet, Take 1 tablet (1,000 mg total) by mouth 2 (two) times daily with a meal., Disp: 60 tablet, Rfl: 3   nicotine polacrilex (NICORETTE) 4 MG gum, Chew 2 pieces (8 mg total) by mouth as needed for smoking cessation., Disp: 110 tablet, Rfl: 2   nitroGLYCERIN (NITRO-DUR) 0.2 mg/hr patch, Cut patch into one - fourth pieces. Place a one-fourth (1/4) piece of patch on skin over affected area, changing to a new piece every 24 hours., Disp: 30 patch, Rfl: 1   Semaglutide, 2 MG/DOSE, (OZEMPIC, 2 MG/DOSE,) 8 MG/3ML SOPN, Inject 2 mg into the skin once a week., Disp: 3 mL, Rfl: 3   Allergies: Allergies  Allergen Reactions   Codeine Other (See Comments)    hallucinations   Sglt2 Inhibitors     Recurrent yeast infections on Jardiance, stopped 06/2022       Objective:   Vitals: Vitals:   08/12/23 0909  BP: 131/83  Pulse: 90  Temp: 98 F (36.7 C)  SpO2: 100%     Physical Exam: Physical Exam Constitutional:      Appearance: Normal appearance.  Pulmonary:     Effort: Pulmonary effort is normal. No respiratory distress.  Genitourinary:    General: Normal vulva.     Vagina: No vaginal discharge.  Neurological:     Mental Status: She is alert.  Psychiatric:        Mood and Affect: Mood normal.        Behavior: Behavior normal.      Data: Labs, imaging, and micro were reviewed in Epic. Refer to Assessment and Plan below for full details in Problem-Based Charting.  Assessment & Plan:  Type 2 diabetes mellitus (HCC) - A1C down to 7.4 and CGM data looks excellent - Continue Metformin 2g daily, Ozempic 2g  weekly, Insulin 75/25 28 units twice daily. No SGLT2 2/2 years infections - Will call Dr. Dione Booze for follow up - Foot exam done today  Healthcare maintenance - Pap done today - Patient requested Cologuard today, which I ordered - MMG done in 08/2022, she plans to get another one this fall   Hypertension - At goal - Continue amlodipine 5 / losartan 20mg  daily   Tobacco abuse - she's working on quitting, she smokes 1/2 ppd - I encouraged cessation - continue nicotine gum. 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

## 2023-08-12 ENCOUNTER — Other Ambulatory Visit (HOSPITAL_COMMUNITY)
Admission: RE | Admit: 2023-08-12 | Discharge: 2023-08-12 | Disposition: A | Discharge status: Home / Self Care | Payer: Medicare Other | Source: Ambulatory Visit | Attending: Internal Medicine | Admitting: Internal Medicine

## 2023-08-12 ENCOUNTER — Other Ambulatory Visit (HOSPITAL_COMMUNITY): Payer: Self-pay

## 2023-08-12 ENCOUNTER — Encounter: Payer: Self-pay | Admitting: Internal Medicine

## 2023-08-12 ENCOUNTER — Ambulatory Visit (INDEPENDENT_AMBULATORY_CARE_PROVIDER_SITE_OTHER): Payer: Medicare Other | Admitting: Internal Medicine

## 2023-08-12 VITALS — BP 131/83 | HR 90 | Temp 98.0°F | Ht 75.0 in | Wt 277.4 lb

## 2023-08-12 DIAGNOSIS — Z1211 Encounter for screening for malignant neoplasm of colon: Secondary | ICD-10-CM

## 2023-08-12 DIAGNOSIS — Z0000 Encounter for general adult medical examination without abnormal findings: Secondary | ICD-10-CM

## 2023-08-12 DIAGNOSIS — E119 Type 2 diabetes mellitus without complications: Secondary | ICD-10-CM | POA: Diagnosis not present

## 2023-08-12 DIAGNOSIS — Z794 Long term (current) use of insulin: Secondary | ICD-10-CM

## 2023-08-12 DIAGNOSIS — M67912 Unspecified disorder of synovium and tendon, left shoulder: Secondary | ICD-10-CM | POA: Diagnosis not present

## 2023-08-12 DIAGNOSIS — Z7985 Long-term (current) use of injectable non-insulin antidiabetic drugs: Secondary | ICD-10-CM | POA: Diagnosis not present

## 2023-08-12 DIAGNOSIS — Z1151 Encounter for screening for human papillomavirus (HPV): Secondary | ICD-10-CM | POA: Insufficient documentation

## 2023-08-12 DIAGNOSIS — Z7984 Long term (current) use of oral hypoglycemic drugs: Secondary | ICD-10-CM | POA: Diagnosis not present

## 2023-08-12 DIAGNOSIS — I10 Essential (primary) hypertension: Secondary | ICD-10-CM | POA: Diagnosis not present

## 2023-08-12 DIAGNOSIS — F17210 Nicotine dependence, cigarettes, uncomplicated: Secondary | ICD-10-CM

## 2023-08-12 DIAGNOSIS — F3181 Bipolar II disorder: Secondary | ICD-10-CM | POA: Diagnosis not present

## 2023-08-12 DIAGNOSIS — Z01419 Encounter for gynecological examination (general) (routine) without abnormal findings: Secondary | ICD-10-CM | POA: Insufficient documentation

## 2023-08-12 DIAGNOSIS — E1165 Type 2 diabetes mellitus with hyperglycemia: Secondary | ICD-10-CM | POA: Diagnosis not present

## 2023-08-12 DIAGNOSIS — Z720 Tobacco use: Secondary | ICD-10-CM

## 2023-08-12 DIAGNOSIS — Z124 Encounter for screening for malignant neoplasm of cervix: Secondary | ICD-10-CM

## 2023-08-12 LAB — POCT GLYCOSYLATED HEMOGLOBIN (HGB A1C): Hemoglobin A1C: 7.4 % — AB (ref 4.0–5.6)

## 2023-08-12 LAB — GLUCOSE, CAPILLARY: Glucose-Capillary: 129 mg/dL — ABNORMAL HIGH (ref 70–99)

## 2023-08-12 MED ORDER — AMLODIPINE-OLMESARTAN 5-20 MG PO TABS
1.0000 | ORAL_TABLET | Freq: Every day | ORAL | 3 refills | Status: DC
  Filled 2023-08-12 – 2023-10-01 (×2): qty 90, 90d supply, fill #0

## 2023-08-12 MED ORDER — INSULIN LISPRO PROT & LISPRO (75-25 MIX) 100 UNIT/ML KWIKPEN
28.0000 [IU] | PEN_INJECTOR | Freq: Two times a day (BID) | SUBCUTANEOUS | 2 refills | Status: DC
Start: 2023-08-12 — End: 2023-11-02
  Filled 2023-08-12 – 2023-08-29 (×2): qty 18, 32d supply, fill #0
  Filled 2023-09-27: qty 18, 32d supply, fill #1
  Filled 2023-10-29: qty 6, 11d supply, fill #2
  Filled 2023-10-29: qty 12, 21d supply, fill #2

## 2023-08-12 MED ORDER — METFORMIN HCL 1000 MG PO TABS
1000.0000 mg | ORAL_TABLET | Freq: Two times a day (BID) | ORAL | 3 refills | Status: DC
  Filled 2023-08-29: qty 60, 30d supply, fill #0
  Filled 2023-09-27: qty 200, 100d supply, fill #0
  Filled 2023-10-31: qty 200, 100d supply, fill #1

## 2023-08-12 MED ORDER — OZEMPIC (2 MG/DOSE) 8 MG/3ML ~~LOC~~ SOPN
2.0000 mg | PEN_INJECTOR | SUBCUTANEOUS | 3 refills | Status: DC
  Filled 2023-08-12 – 2023-08-29 (×2): qty 3, 28d supply, fill #0
  Filled 2023-09-27: qty 3, 28d supply, fill #1
  Filled 2023-10-29: qty 3, 28d supply, fill #2

## 2023-08-12 MED ORDER — ATORVASTATIN CALCIUM 40 MG PO TABS
40.0000 mg | ORAL_TABLET | Freq: Every day | ORAL | 2 refills | Status: DC
  Filled 2023-08-12 – 2023-09-27 (×2): qty 100, 100d supply, fill #0
  Filled 2023-10-29 (×2): qty 100, 100d supply, fill #1

## 2023-08-12 NOTE — Assessment & Plan Note (Signed)
-   Chronic and worsening - She has tried PT, injections, and NSAIDS - She is established with sports medicine and orthopedic surgery - She is contemplating surgery - I encouraged her to continue exercises to maintain mobility of shoulder

## 2023-08-12 NOTE — Assessment & Plan Note (Signed)
-   she's working on quitting, she smokes 1/2 ppd - I encouraged cessation - continue nicotine gum. Would not recommend buproprion given her other psychiatric medicines

## 2023-08-12 NOTE — Assessment & Plan Note (Signed)
-   chronic and stable - psychiatry has recently changed her meds to: amitriptyline 25, latuda 40, Lamictal 100mg  daily, Gabapentin 400mg  TID, Buspar 30mg  BID

## 2023-08-12 NOTE — Assessment & Plan Note (Signed)
-   At goal - Continue amlodipine 5 / losartan 20mg  daily

## 2023-08-12 NOTE — Assessment & Plan Note (Signed)
-   A1C down to 7.4 and CGM data looks excellent - Continue Metformin 2g daily, Ozempic 2g weekly, Insulin 75/25 28 units twice daily. No SGLT2 2/2 years infections - Will call Dr. Dione Booze for follow up - Foot exam done today

## 2023-08-12 NOTE — Patient Instructions (Signed)
Dear Ms Huebert,  It was a pleasure seeing you in clinic today.  I will call you with the results of your labs and pap - this may take up to 1 week to come back.   I have sent refills to your pharmacy.  I will see you back in clinic in 3 months, please call if you need anything sooner.  Sincerely, Dr. Mercie Eon

## 2023-08-12 NOTE — Assessment & Plan Note (Signed)
-   Pap done today - Patient requested Cologuard today, which I ordered - MMG done in 08/2022, she plans to get another one this fall

## 2023-08-12 NOTE — Addendum Note (Signed)
Addended by: Derrek Monaco on: 08/12/2023 11:28 AM   Modules accepted: Level of Service

## 2023-08-13 ENCOUNTER — Other Ambulatory Visit (HOSPITAL_COMMUNITY): Payer: Self-pay

## 2023-08-14 ENCOUNTER — Ambulatory Visit (HOSPITAL_COMMUNITY): Payer: Medicare Other | Admitting: Clinical

## 2023-08-14 LAB — LIPID PANEL
Chol/HDL Ratio: 4.7 {ratio} — ABNORMAL HIGH (ref 0.0–4.4)
Cholesterol, Total: 151 mg/dL (ref 100–199)
HDL: 32 mg/dL — ABNORMAL LOW (ref >39)
LDL Chol Calc (NIH): 93 mg/dL (ref 0–99)
Triglycerides: 149 mg/dL (ref 0–149)
VLDL Cholesterol Cal: 26 mg/dL (ref 5–40)

## 2023-08-14 LAB — CMP14 + ANION GAP
ALT: 22 [IU]/L (ref 0–32)
AST: 18 [IU]/L (ref 0–40)
Albumin: 4.3 g/dL (ref 3.8–4.9)
Alkaline Phosphatase: 94 [IU]/L (ref 44–121)
Anion Gap: 20 mmol/L — ABNORMAL HIGH (ref 10.0–18.0)
BUN/Creatinine Ratio: 13 (ref 9–23)
BUN: 8 mg/dL (ref 6–24)
Bilirubin Total: <0.2 mg/dL (ref 0.0–1.2)
CO2: 16 mmol/L — ABNORMAL LOW (ref 20–29)
Calcium: 9.4 mg/dL (ref 8.7–10.2)
Chloride: 105 mmol/L (ref 96–106)
Creatinine, Ser: 0.63 mg/dL (ref 0.57–1.00)
Globulin, Total: 3.3 g/dL (ref 1.5–4.5)
Glucose: 111 mg/dL — ABNORMAL HIGH (ref 70–99)
Potassium: 4.2 mmol/L (ref 3.5–5.2)
Sodium: 141 mmol/L (ref 134–144)
Total Protein: 7.6 g/dL (ref 6.0–8.5)
eGFR: 105 mL/min/{1.73_m2} (ref >59)

## 2023-08-17 LAB — CYTOLOGY - PAP
Adequacy: ABSENT
Comment: NEGATIVE
Diagnosis: NEGATIVE
High risk HPV: NEGATIVE

## 2023-08-17 NOTE — Progress Notes (Signed)
Pap normal

## 2023-08-26 DIAGNOSIS — Z1211 Encounter for screening for malignant neoplasm of colon: Secondary | ICD-10-CM | POA: Diagnosis not present

## 2023-08-26 DIAGNOSIS — Z1212 Encounter for screening for malignant neoplasm of rectum: Secondary | ICD-10-CM | POA: Diagnosis not present

## 2023-08-27 ENCOUNTER — Encounter: Payer: Self-pay | Admitting: Podiatry

## 2023-08-27 ENCOUNTER — Ambulatory Visit: Payer: Medicare Other | Admitting: Podiatry

## 2023-08-27 ENCOUNTER — Other Ambulatory Visit (HOSPITAL_COMMUNITY): Payer: Self-pay

## 2023-08-27 DIAGNOSIS — M7751 Other enthesopathy of right foot: Secondary | ICD-10-CM | POA: Diagnosis not present

## 2023-08-27 DIAGNOSIS — E1149 Type 2 diabetes mellitus with other diabetic neurological complication: Secondary | ICD-10-CM

## 2023-08-27 DIAGNOSIS — Q828 Other specified congenital malformations of skin: Secondary | ICD-10-CM

## 2023-08-27 MED ORDER — IBUPROFEN 800 MG PO TABS
800.0000 mg | ORAL_TABLET | Freq: Three times a day (TID) | ORAL | 0 refills | Status: DC | PRN
  Filled 2023-08-27: qty 30, 10d supply, fill #0

## 2023-08-27 NOTE — Patient Instructions (Signed)

## 2023-08-27 NOTE — Progress Notes (Signed)
Subjective: Chief Complaint  Patient presents with   Callouses    Callus bilat - Diabetic    52 year old female presents the office today for concerns of painful calluses on both of her feet.  Still gets ongoing pain to the ankle which is chronic and occasional swelling. No new injuries or concerns. No open lesions.   Last A1c was 7.4 on 08/12/2023  Mercie Eon, MD Last seen: 08/12/2023.  Baird Lyons she called COVID-19.  I am going to work towards the monitor just  Objective: AAO x3, NAD DP/PT pulses palpable bilaterally, CRT less than 3 seconds Significant bunions are present bilateral with hammertoes deformities bilaterally.  Flatfoot is present bilaterally.   Hyperkeratotic lesion sulcus of the right first interspace, left submetatarsal 2 as well as fifth metatarsal head on the left foot.  Minimal callus on the medial aspect the left second toe.  There is no underlying ulceration drainage or any signs of infection.  There is no ulcerations. Mild diffuse discomfort of the ankle with mild chronic swelling.  No area of pinpoint tenderness.  Crepitus present.  Previous triple arthrodesis. No pain with calf compression, swelling, warmth, erythema  Assessment: Right ankle arthritis, flatfoot; hyperkeratotic lesions  Plan: -All treatment options discussed with the patient including all alternatives, risks, complications.  -Sharply debrided hyperkeratotic lesions x 4 without any complications or bleeding -Offered steroid injection of the ankle today.  Ibuprofen as needed, refilled today.  Continue bracing. -Daily foot inspection, glucose control. -Patient encouraged to call the office with any questions, concerns, change in symptoms.   Vivi Barrack DPM

## 2023-08-28 ENCOUNTER — Other Ambulatory Visit: Payer: Self-pay | Admitting: Internal Medicine

## 2023-08-28 DIAGNOSIS — Z1231 Encounter for screening mammogram for malignant neoplasm of breast: Secondary | ICD-10-CM

## 2023-08-31 ENCOUNTER — Other Ambulatory Visit (HOSPITAL_COMMUNITY): Payer: Self-pay

## 2023-08-31 ENCOUNTER — Other Ambulatory Visit: Payer: Self-pay

## 2023-09-02 LAB — COLOGUARD: COLOGUARD: NEGATIVE

## 2023-09-04 ENCOUNTER — Other Ambulatory Visit (HOSPITAL_COMMUNITY): Payer: Self-pay

## 2023-09-04 ENCOUNTER — Ambulatory Visit (HOSPITAL_COMMUNITY): Payer: Self-pay | Admitting: Clinical

## 2023-09-04 DIAGNOSIS — F3181 Bipolar II disorder: Secondary | ICD-10-CM

## 2023-09-04 NOTE — Progress Notes (Signed)
Tina [name] is a 52 y.o. female patient who presented oriented x 5, appropriately dressed, and friendly.  Client denied hallucinations and delusions. Therapist did not make contact with the client at the scheduled appointment time being MyChart video.  Client reported she has been up and down but has maintained fairly well.  Client reported she believes her psychiatric medication regimen is working fairly well but she has noticed that changed with a auditory hallucination she has been hearing. Client reported the voice sounds demonic and cusses. Client reported it is not affecting her actions. Client reported she and her brothers are deciding what they are going to do for thanksgiving since their family does not stay in contact since her mother died. Client reported otherwise she is doing okay. Therapist and client did not speak long enough for billable time.    Collaboration of Care: Patient refused AEB none requested by the client.  Patient/Guardian was advised Release of Information must be obtained prior to any record release in order to collaborate their care with an outside provider. Patient/Guardian was advised if they have not already done so to contact the registration department to sign all necessary forms in order for [place] to release information regarding their care.   Consent: Patient/Guardian gives verbal consent for treatment and assignment of benefits for services provided during this visit. Patient/Guardian expressed understanding and agreed to proceed.    Neena Rhymes Montrey Buist, LCSW

## 2023-09-27 ENCOUNTER — Other Ambulatory Visit: Payer: Self-pay

## 2023-09-27 ENCOUNTER — Other Ambulatory Visit (HOSPITAL_COMMUNITY): Payer: Self-pay | Admitting: Physician Assistant

## 2023-09-27 DIAGNOSIS — F3181 Bipolar II disorder: Secondary | ICD-10-CM

## 2023-09-28 ENCOUNTER — Other Ambulatory Visit (HOSPITAL_COMMUNITY): Payer: Self-pay

## 2023-09-29 ENCOUNTER — Other Ambulatory Visit (HOSPITAL_COMMUNITY): Payer: Self-pay | Admitting: Physician Assistant

## 2023-09-29 DIAGNOSIS — F411 Generalized anxiety disorder: Secondary | ICD-10-CM

## 2023-09-29 DIAGNOSIS — F3181 Bipolar II disorder: Secondary | ICD-10-CM

## 2023-09-30 ENCOUNTER — Other Ambulatory Visit (HOSPITAL_COMMUNITY): Payer: Self-pay

## 2023-09-30 MED ORDER — LURASIDONE HCL 40 MG PO TABS
40.0000 mg | ORAL_TABLET | Freq: Every day | ORAL | 1 refills | Status: DC
  Filled 2023-09-30: qty 30, 30d supply, fill #0

## 2023-09-30 MED ORDER — AMITRIPTYLINE HCL 25 MG PO TABS
25.0000 mg | ORAL_TABLET | Freq: Every day | ORAL | 1 refills | Status: DC
  Filled 2023-09-30: qty 30, 30d supply, fill #0

## 2023-09-30 MED ORDER — LAMOTRIGINE 100 MG PO TABS
100.0000 mg | ORAL_TABLET | Freq: Every day | ORAL | 1 refills | Status: DC
  Filled 2023-09-30: qty 30, 30d supply, fill #0

## 2023-09-30 NOTE — Telephone Encounter (Signed)
Pt called to request refills on elavil and buspar. Last visit 07/07/23. No Follow Up scheduled.

## 2023-09-30 NOTE — Telephone Encounter (Signed)
Refill complete

## 2023-10-01 ENCOUNTER — Other Ambulatory Visit (HOSPITAL_COMMUNITY): Payer: Self-pay | Admitting: Physician Assistant

## 2023-10-01 DIAGNOSIS — F411 Generalized anxiety disorder: Secondary | ICD-10-CM

## 2023-10-02 ENCOUNTER — Other Ambulatory Visit (HOSPITAL_COMMUNITY): Payer: Self-pay

## 2023-10-05 ENCOUNTER — Other Ambulatory Visit (HOSPITAL_COMMUNITY): Payer: Self-pay

## 2023-10-05 ENCOUNTER — Ambulatory Visit (INDEPENDENT_AMBULATORY_CARE_PROVIDER_SITE_OTHER): Payer: Medicare Other | Admitting: Clinical

## 2023-10-05 ENCOUNTER — Telehealth (HOSPITAL_COMMUNITY): Payer: Self-pay

## 2023-10-05 ENCOUNTER — Ambulatory Visit: Payer: Medicare Other

## 2023-10-05 DIAGNOSIS — F3181 Bipolar II disorder: Secondary | ICD-10-CM

## 2023-10-05 NOTE — Progress Notes (Signed)
THERAPIST PROGRESS NOTE Virtual Visit via Video Note  I connected with Tina [name] on 10/05/23 at  8:00 AM EST by a video enabled telemedicine application and verified that I am speaking with the correct person using two identifiers.  Location: Patient: home Provider: office   I discussed the limitations of evaluation and management by telemedicine and the availability of in person appointments. The patient expressed understanding and agreed to proceed.   Follow Up Instructions: I discussed the assessment and treatment plan with the patient. The patient was provided an opportunity to ask questions and all were answered. The patient agreed with the plan and demonstrated an understanding of the instructions.   The patient was advised to call back or seek an in-person evaluation if the symptoms worsen or if the condition fails to improve as anticipated.   Session Time: 20 minutes  Participation Level: Active  Behavioral Response: CasualAlertDepressed  Type of Therapy: Individual Therapy  Treatment Goals addressed: Janete WILL IDENTIFY 3 COGNITIVE PATTERNS AND BELIEFS THAT SUPPORT DEPRESSION   ProgressTowards Goals: Progressing  Interventions: CBT  Summary:  Tina [name] is a 52 y.o. female who presents for the scheduled appointment oriented x 5, appropriately dressed, and friendly. Client reported auditory hallucinations. Client did not delusions. Client reported on today the Thanksgiving holiday was fairly okay for her.  Client reported the brother that she usually spends her time with decided to go out of town to spend holiday with a childhood friend.  Client reported that brother that she does not get along with as well did his own holiday with his girlfriend.  Client reported she had a pretty good friend to invited her over for Thanksgiving dinner with her family.  Client reported it was nice but different for her send she usually only has been  time time with family members.  Client reported the holiday season is usually the depressing especially since her mother's passing.  Client reported she does not see that feeling changing because her family has dispersed and no one really talks to each other.  Client reported otherwise she does not know how christmas will be but it could be possible that she will spend it alone. Client reported she has been having issues with auditory hallucinations. Client reported the voices use to be faint but over the past few months it has grown louder. Client reported it says " f- you, why don't you go do something, you don't deserve to be here". Client reported she tried to ignore it. Client reported she told her psychiatrist about it last time but the medication she is taking is not helping. Client reported she is also not sleeping throughout the night which could cause it to worsen. Evidence of progress towards goal:  client reported medication compliance 7 days per week. Client reported 1 skill of distraction to help with AVH.  Suicidal/Homicidal: Nowithout intent/plan  Therapist Response:  Therapist began the appointment asking the client how she has been doing since last seen. Therapist used cbt to engage using active listening and positive emotional support. Therapist used cbt to engage and ask the client about severity of her depressive symptoms and medication compliance. Therapist used cbt to discuss how to plan ahead to get through the holiday season. Therapist used cbt to discuss assess for safety and how to cope with AVH. Therapist used CBT ask the client to identify her progress with frequency of use with coping skills with continued practice in her daily activity.  Therapist assigned the client homework to practice self care.  Plan: Return again in 4 weeks.  Diagnosis: bipolar 2 disorder, major depressive episode  Collaboration of Care: Patient refused AEB none requested by the  client.  Patient/Guardian was advised Release of Information must be obtained prior to any record release in order to collaborate their care with an outside provider. Patient/Guardian was advised if they have not already done so to contact the registration department to sign all necessary forms in order for [place] to release information regarding their care.   Consent: Patient/Guardian gives verbal consent for treatment and assignment of benefits for services provided during this visit. Patient/Guardian expressed understanding and agreed to proceed.   Neena Rhymes Madiha Bambrick, LCSW 10/05/2023

## 2023-10-06 ENCOUNTER — Ambulatory Visit: Payer: Medicare Other

## 2023-10-06 MED ORDER — BUSPIRONE HCL 30 MG PO TABS
30.0000 mg | ORAL_TABLET | Freq: Two times a day (BID) | ORAL | 1 refills | Status: DC
Start: 2023-10-06 — End: 2023-10-09
  Filled 2023-10-06: qty 60, 30d supply, fill #0

## 2023-10-06 NOTE — Telephone Encounter (Signed)
Message acknowledged and reviewed.

## 2023-10-07 ENCOUNTER — Other Ambulatory Visit (HOSPITAL_COMMUNITY): Payer: Self-pay

## 2023-10-09 ENCOUNTER — Other Ambulatory Visit (HOSPITAL_COMMUNITY): Payer: Self-pay

## 2023-10-09 ENCOUNTER — Telehealth (INDEPENDENT_AMBULATORY_CARE_PROVIDER_SITE_OTHER): Payer: Medicare Other | Admitting: Physician Assistant

## 2023-10-09 DIAGNOSIS — F3181 Bipolar II disorder: Secondary | ICD-10-CM | POA: Diagnosis not present

## 2023-10-09 DIAGNOSIS — F411 Generalized anxiety disorder: Secondary | ICD-10-CM

## 2023-10-09 IMAGING — MG MM DIGITAL SCREENING BILAT W/ TOMO AND CAD
8 series · 8 of 24 positions shown · non-contrast
Comparison: None.

ACR Breast Density Category a: The breast tissue is almost entirely
fatty.

CLINICAL DATA: Screening.

EXAM:
DIGITAL SCREENING BILATERAL MAMMOGRAM WITH TOMOSYNTHESIS AND CAD
TECHNIQUE: Bilateral screening digital craniocaudal and mediolateral oblique
mammograms were obtained. Bilateral screening digital breast
tomosynthesis was performed. The images were evaluated with
computer-aided detection.

[L CC synth-2D]
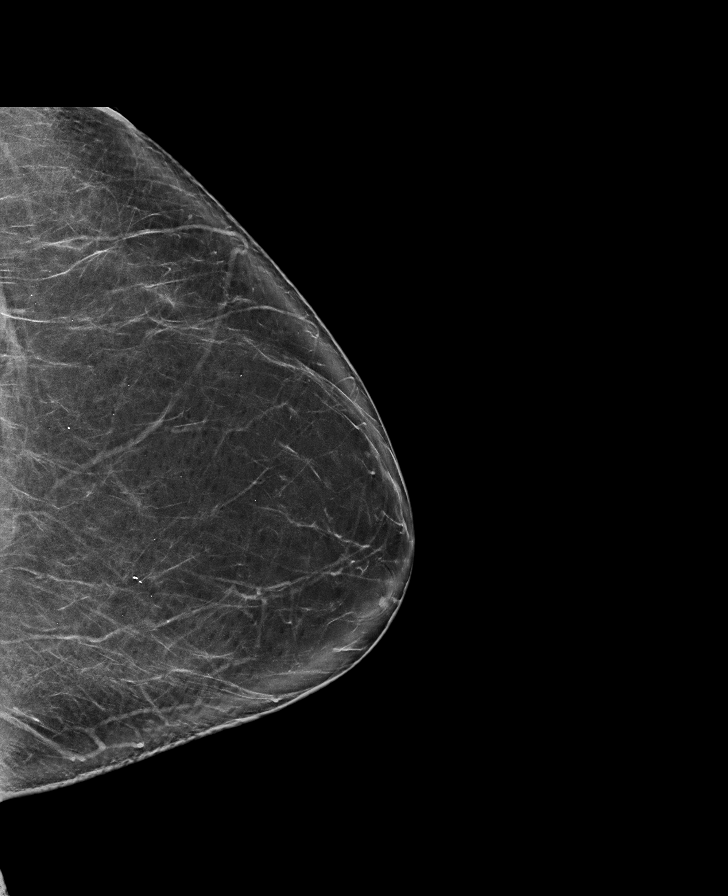

[L MLO synth-2D]
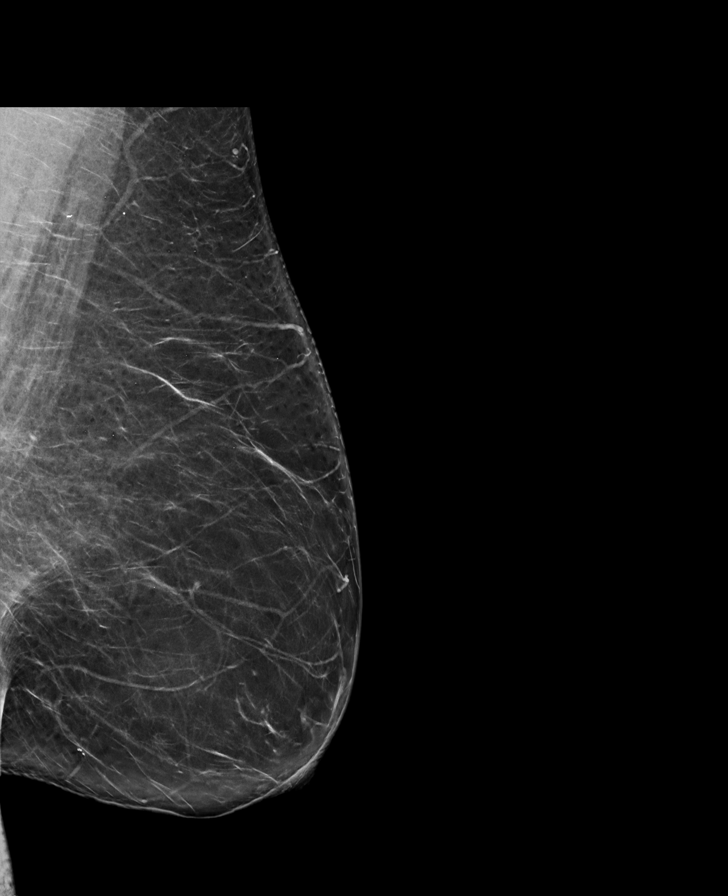

[R MLO synth-2D]
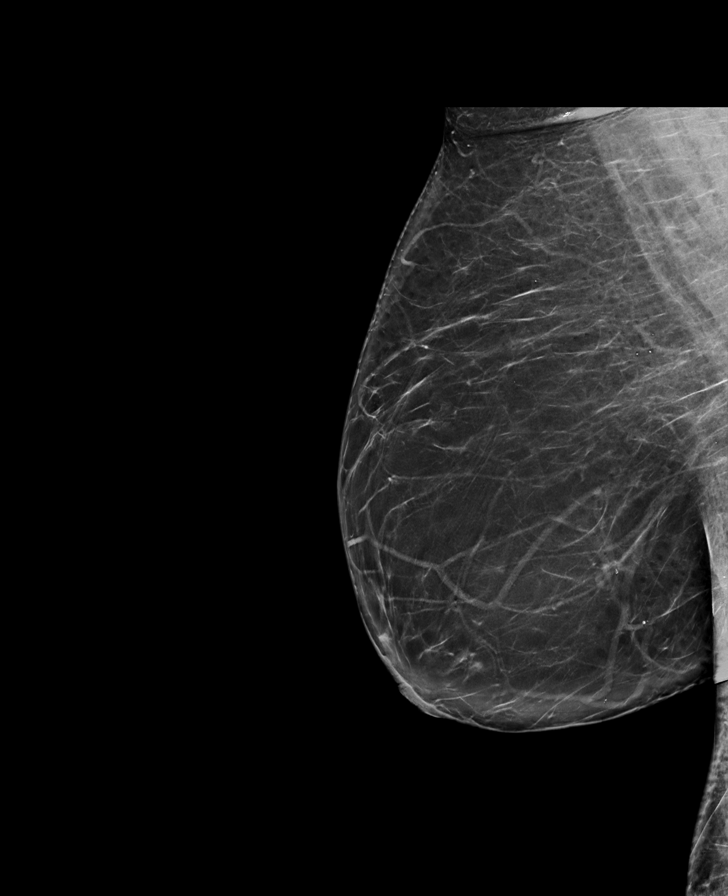

[R CC synth-2D]
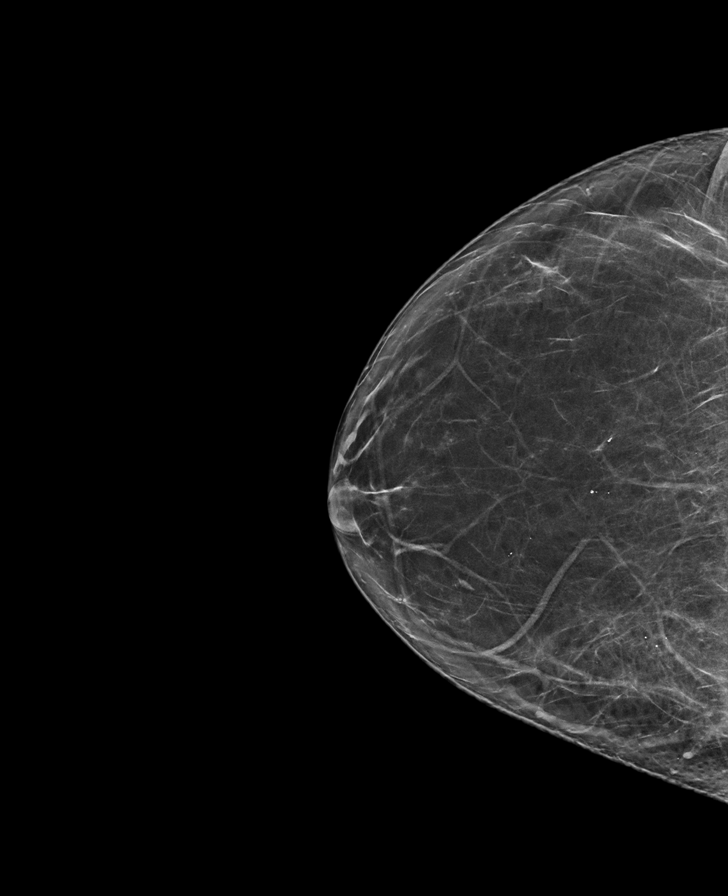

[L MLO tomo · tomo slice 43/84.0]
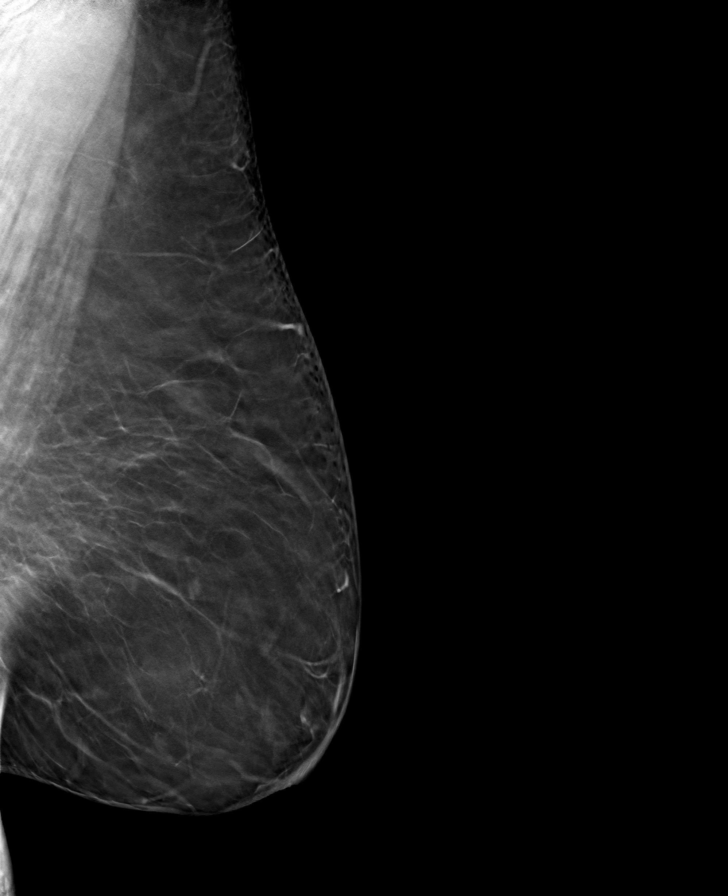

[R MLO tomo · tomo slice 44/87.0]
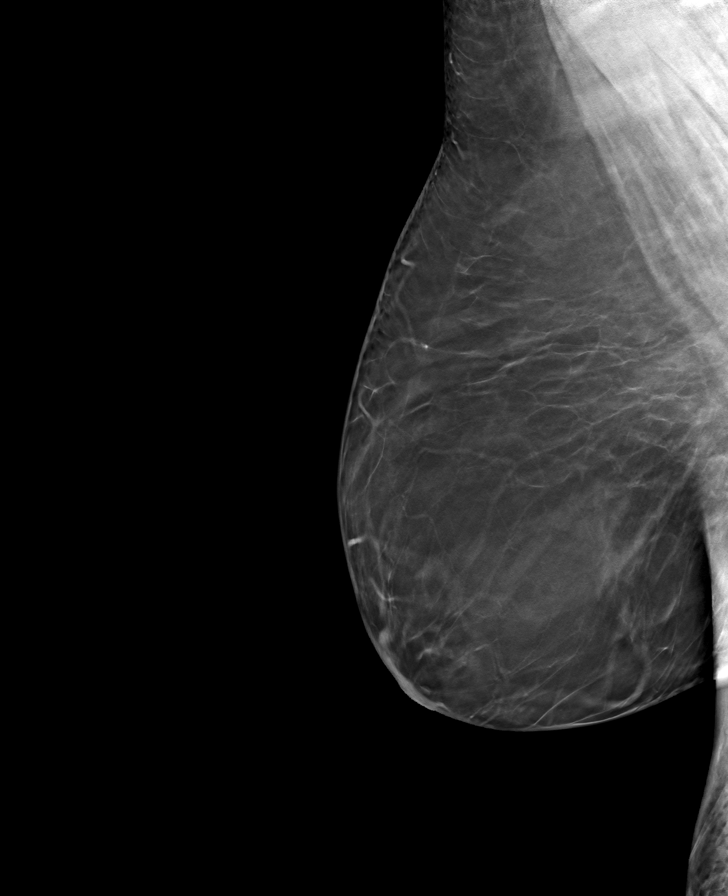

[L CC tomo · tomo slice 42/83.0]
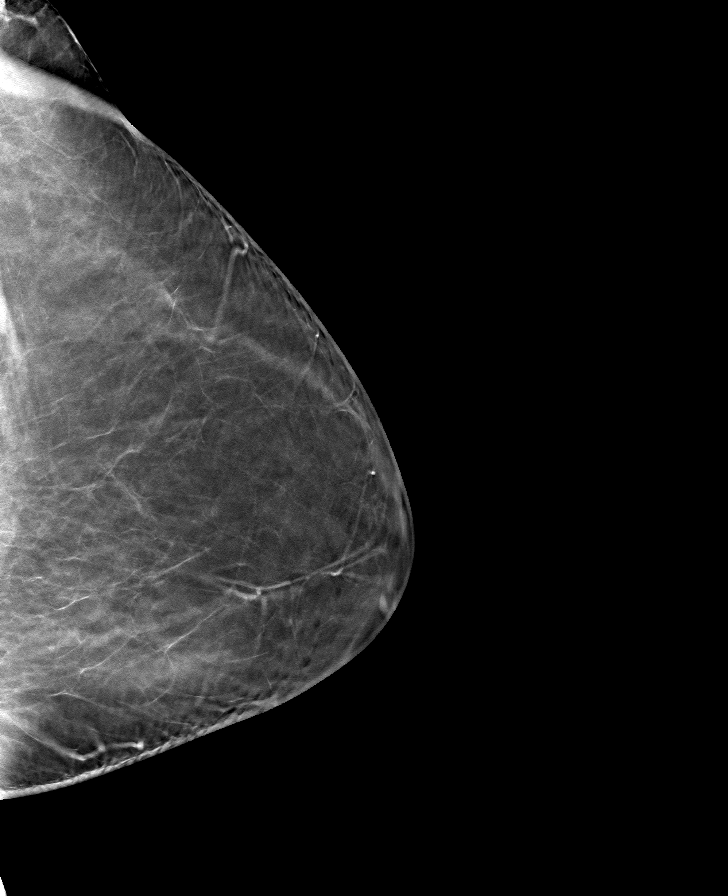

[R CC tomo · tomo slice 37/73.0]
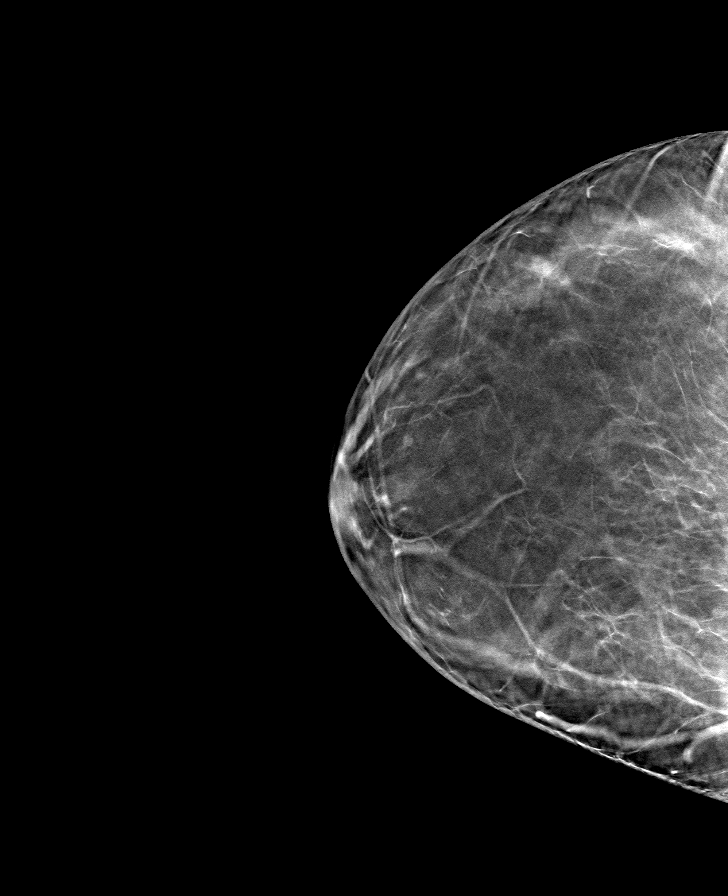

[8 of 24 positions shown; findings below may reference images not displayed]

FINDINGS: There are no findings suspicious for malignancy.
IMPRESSION: No mammographic evidence of malignancy. A result letter of this
screening mammogram will be mailed directly to the patient.

RECOMMENDATION:
Screening mammogram in one year. (Code:44-M-M6Q)

BI-RADS CATEGORY  1: Negative.

## 2023-10-09 MED ORDER — QUETIAPINE FUMARATE 50 MG PO TABS
ORAL_TABLET | ORAL | 1 refills | Status: DC
  Filled 2023-10-09: qty 60, 30d supply, fill #0
  Filled 2023-10-29 – 2023-10-31 (×2): qty 60, 30d supply, fill #1

## 2023-10-09 MED ORDER — LAMOTRIGINE 100 MG PO TABS
100.0000 mg | ORAL_TABLET | Freq: Every day | ORAL | 1 refills | Status: DC
  Filled 2023-10-09 – 2023-10-29 (×2): qty 30, 30d supply, fill #0
  Filled 2023-11-28: qty 30, 30d supply, fill #1

## 2023-10-09 MED ORDER — BUSPIRONE HCL 30 MG PO TABS
30.0000 mg | ORAL_TABLET | Freq: Two times a day (BID) | ORAL | 1 refills | Status: DC
  Filled 2023-10-09 – 2023-10-31 (×4): qty 60, 30d supply, fill #0
  Filled 2023-11-28: qty 60, 30d supply, fill #1

## 2023-10-09 MED ORDER — AMITRIPTYLINE HCL 25 MG PO TABS
25.0000 mg | ORAL_TABLET | Freq: Every day | ORAL | 1 refills | Status: DC
  Filled 2023-10-09 – 2023-10-29 (×2): qty 30, 30d supply, fill #0
  Filled 2023-12-07: qty 30, 30d supply, fill #1

## 2023-10-09 MED ORDER — GABAPENTIN 400 MG PO CAPS
400.0000 mg | ORAL_CAPSULE | Freq: Three times a day (TID) | ORAL | 1 refills | Status: DC
  Filled 2023-10-09 – 2023-10-29 (×2): qty 90, 30d supply, fill #0
  Filled 2023-11-28: qty 90, 30d supply, fill #1

## 2023-10-09 NOTE — Progress Notes (Cosign Needed Addendum)
BH MD/PA/NP OP Progress Note  Virtual Visit via Video Note  I connected with Tina [name] on 10/12/23 at  3:30 PM EST by a video enabled telemedicine application and verified that I am speaking with the correct person using two identifiers.  Location: Patient: Home Provider: Clinic   I discussed the limitations of evaluation and management by telemedicine and the availability of in person appointments. The patient expressed understanding and agreed to proceed.  Follow Up Instructions:   I discussed the assessment and treatment plan with the patient. The patient was provided an opportunity to ask questions and all were answered. The patient agreed with the plan and demonstrated an understanding of the instructions.   The patient was advised to call back or seek an in-person evaluation if the symptoms worsen or if the condition fails to improve as anticipated.  I provided 19 minutes of non-face-to-face time during this encounter.  Meta Hatchet, PA   10/12/2023 2:36 PM Tina [name]  MRN:  [id number]  Chief Complaint:  Chief Complaint  Patient presents with   Follow-up   Medication Management   HPI:   Tina [name] is a 52 year old, African-American female with a past psychiatric history significant for bipolar 2 disorder emphases major depressive episode), generalized anxiety disorder, insomnia, and tobacco use disorder who presents to [facility] via virtual video visit for follow-up and medication management.  Patient is currently being managed on the following psychiatric medications:  Latuda 40 mg daily Amitriptyline 25 mg at bedtime Gabapentin 400 mg 3 times daily Buspirone 30 mg 2 times daily Lamotrigine 100 mg daily  Patient to to the encounter stating that she has been hearing voices and that they have been very bad as of late.  She reports that the voices are heard every day or every  other day.  She reports that her use of Latuda has not been helpful in managing the voices.  In addition to auditory hallucinations, patient endorses visual hallucinations characterized by shadows and seeing people that are dead.  She reports that she last saw visual hallucinations 2 days ago.  Patient has been on the following psychiatric medications for the management bipolar 2 disorder: Abilify, Anderson [place], and Jordan.  In addition to her hallucinations, patient endorses depression and rates her depression an 8 out of 10 with 10 being most severe.  Patient endorses depressive episodes 3 to 4 days out of the week.  Patient endorses the following depressive symptoms: Feelings of sadness, lack of motivation, irritability, decreased energy, decreased concentration, excessive worrying, and feelings of guilt/worthlessness.  Patient denies hopelessness.  Patient endorses anxiety and rates her anxiety an 8 out of 10.  Patient endorses general life stressors.  A PHQ-9 screen was performed with the patient scoring a 15.  A GAD-7 screen was also performed with the patient scoring a 17.  Patient is alert and oriented x 4, calm, cooperative, and fully engaged in conversation during the encounter.  Patient describes her mood as irritable.  Patient denies suicidal or homicidal ideations.  She denies active auditory or visual hallucinations at this time and does not appear to be responding to internal/external stimuli.  Patient endorses poor sleep and receives on average 2 hours of sleep per night.  Patient endorses fair appetite and eats on average 1-2 meals per day.  Patient denies alcohol consumption or illicit drug use.  Patient endorses tobacco use and smokes on average 1/2 pack/day.  Visit Diagnosis:  ICD-10-CM   1. Generalized anxiety disorder  F41.1 busPIRone (BUSPAR) 30 MG tablet    amitriptyline (ELAVIL) 25 MG tablet    gabapentin (NEURONTIN) 400 MG capsule    2. Bipolar 2 disorder, major depressive  episode (HCC)  F31.81 lamoTRIgine (LAMICTAL) 100 MG tablet    amitriptyline (ELAVIL) 25 MG tablet    gabapentin (NEURONTIN) 400 MG capsule    QUEtiapine (SEROQUEL) 50 MG tablet       Past Psychiatric History:  Insomnia Tobacco use Anxiety Depression Biplor 2 disorder  Past Medical History:  Past Medical History:  Diagnosis Date   Diabetes mellitus    type II   Fibroids    HTN (hypertension)    Hyperlipidemia    Iron deficiency anemia    Left acetabular fracture (HCC)    Lumbar strain    Obesity    Ovarian cyst     Past Surgical History:  Procedure Laterality Date   FOOT ARTHRODESIS, MODIFIED MCBRIDE     right foot bunion surgery and ankle surgery 1980s   ORIF ACETABULAR FRACTURE     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    Family Psychiatric History:  Niece - Schizophrenia  Family History:  Family History  Problem Relation Age of Onset   Colon cancer Sister    Diabetes Mother    Diabetes Father    Diabetes Brother    Celiac disease Paternal Aunt     Social History:  Social History   Socioeconomic History   Marital status: Single    Spouse name: Not on file   Number of children: 0   Years of education: 12   Highest education level: Some college, no degree  Occupational History   Occupation: TEFL teacher: UNEMPLOYED    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, academic  Tobacco Use   Smoking status: Every Day    Current packs/day: 0.50    Average packs/day: 0.5 packs/day for 23.0 years (11.5 ttl pk-yrs)    Types: Cigarettes   Smokeless tobacco: Never   Tobacco comments:    1/2PPD  Vaping Use   Vaping status: Never Used  Substance and Sexual Activity   Alcohol use: Yes    Alcohol/week: 0.0 standard drinks of alcohol    Comment: occ   Drug use: No   Sexual activity: Yes    Partners: Female    Birth control/protection: Injection    Comment: Depo  Other Topics Concern   Not on file  Social History Narrative   Homosexual; in a monogamous relationship w/ homosexual  woman.   She is disabled.  She last worked in 2018 at [employer].    Right handed   One story home   Caffeine: 1 soda daily, occas drinks coffee    Social Determinants of Health   Financial Resource Strain: Low Risk  (03/05/2023)   Overall Financial Resource Strain (CARDIA)    Difficulty of Paying Living Expenses: Not very hard  Food Insecurity: Food Insecurity Present (03/05/2023)   Hunger Vital Sign    Worried About Running Out of Food in the Last Year: Sometimes true    Ran Out of Food in the Last Year: Sometimes true  Transportation Needs: No Transportation Needs (03/05/2023)   PRAPARE - Administrator, Civil Service (Medical): No    Lack of Transportation (Non-Medical): No  Physical Activity: Insufficiently Active (03/05/2023)   Exercise Vital Sign    Days of Exercise per Week: 3 days    Minutes of Exercise per Session:  30 min  Stress: Stress Concern Present (03/05/2023)   [employer] of Occupational Health - Occupational Stress Questionnaire    Feeling of Stress : To some extent  Social Connections: Moderately Isolated (03/05/2023)   Social Connection and Isolation Panel [NHANES]    Frequency of Communication with Friends and Family: More than three times a week    Frequency of Social Gatherings with Friends and Family: Once a week    Attends Religious Services: 1 to 4 times per year    Active Member of [employer] or Organizations: No    Attends Banker Meetings: Never    Marital Status: Never married    Allergies:  Allergies  Allergen Reactions   Codeine Other (See Comments)    hallucinations   Sglt2 Inhibitors     Recurrent yeast infections on Jardiance, stopped 06/2022    Metabolic Disorder Labs: Lab Results  Component Value Date   HGBA1C 7.4 (A) 08/12/2023   MPG 200 05/25/2017   MPG 237 02/26/2016   No results found for: "PROLACTIN" Lab Results  Component Value Date   CHOL 151 08/12/2023   TRIG 149 08/12/2023   HDL 32 (L) 08/12/2023    CHOLHDL 4.7 (H) 08/12/2023   VLDL 50 (H) 08/21/2014   LDLCALC 93 08/12/2023   LDLCALC 97 03/11/2022   Lab Results  Component Value Date   TSH 1.074 06/27/2011    Therapeutic Level Labs: No results found for: "LITHIUM" No results found for: "VALPROATE" No results found for: "CBMZ"  Current Medications: Current Outpatient Medications  Medication Sig Dispense Refill   QUEtiapine (SEROQUEL) 50 MG tablet Take 1 tablet (50 mg total) by mouth at bedtime for 6 days, THEN 2 tablets (100 mg total) at bedtime. 60 tablet 1   amitriptyline (ELAVIL) 25 MG tablet Take 1 tablet (25 mg total) by mouth at bedtime. 30 tablet 1   amLODipine-olmesartan (AZOR) 5-20 MG tablet Take 1 tablet by mouth daily. 90 tablet 3   atorvastatin (LIPITOR) 40 MG tablet Take 1 tablet (40 mg total) by mouth daily. 100 tablet 2   blood glucose meter kit and supplies KIT Use up to 4 (four) times daily as directed 1 each 0   Blood Glucose Monitoring Suppl (CONTOUR NEXT ONE) KIT 1 each by Does not apply route See admin instructions. USE TO CHECK BLOOD GLUCOSE ONCE DAILY. IM PROGRAM. 1 kit 0   busPIRone (BUSPAR) 30 MG tablet Take 1 tablet (30 mg total) by mouth 2 (two) times daily. 60 tablet 1   Continuous Glucose Sensor (FREESTYLE LIBRE 3 SENSOR) MISC Place 1 sensor on the skin every 14 days. Use to check glucose continuously 6 each 3   cyclobenzaprine (FLEXERIL) 5 MG tablet Take 1 tablet (5 mg total) by mouth at bedtime as needed for muscle spasms. 30 tablet 1   gabapentin (NEURONTIN) 400 MG capsule Take 1 capsule (400 mg total) by mouth 3 (three) times daily. 90 capsule 1   glucose blood (CONTOUR NEXT TEST) test strip Please use to check your sugar 3 times a day. 100 strip 3   glucose blood test strip Use up to 4 (four) times daily as directed 100 each 0   ibuprofen (ADVIL) 800 MG tablet Take 1 tablet (800 mg total) by mouth every 8 (eight) hours as needed. 30 tablet 0   Insulin Lispro Prot & Lispro (HUMALOG MIX 75/25 KWIKPEN)  (75-25) 100 UNIT/ML Kwikpen Inject 28 Units into the skin 2 (two) times daily before a meal. 18 mL  2   Insulin Pen Needle 32G X 4 MM MISC Use new pen needle with each insulin injection two times daily 200 each 4   lamoTRIgine (LAMICTAL) 100 MG tablet Take 1 tablet (100 mg total) by mouth daily. 30 tablet 1   Lancets (ONETOUCH DELICA PLUS LANCET33G) MISC Use up to 4 (four) times daily as directed 100 each 0   Lancets MISC 1 Units by Does not apply route 3 (three) times daily. 100 each 3   metFORMIN (GLUCOPHAGE) 1000 MG tablet Take 1 tablet (1,000 mg total) by mouth 2 (two) times daily with a meal. 60 tablet 3   nicotine polacrilex (NICORETTE) 4 MG gum Chew 2 pieces (8 mg total) by mouth as needed for smoking cessation. 110 tablet 2   nitroGLYCERIN (NITRO-DUR) 0.2 mg/hr patch Cut patch into one - fourth pieces. Place a one-fourth (1/4) piece of patch on skin over affected area, changing to a new piece every 24 hours. 30 patch 1   Semaglutide, 2 MG/DOSE, (OZEMPIC, 2 MG/DOSE,) 8 MG/3ML SOPN Inject 2 mg into the skin once a week. 3 mL 3   No current facility-administered medications for this visit.     Musculoskeletal: Strength & Muscle Tone: Unable to assess due to telemedicine visit Gait & Station: Unable to assess due to telemedicine visit Patient leans: Unable to assess due to telemedicine visit  Psychiatric Specialty Exam: Review of Systems  Psychiatric/Behavioral:  Positive for dysphoric mood and sleep disturbance. Negative for decreased concentration, hallucinations, self-injury and suicidal ideas. The patient is nervous/anxious. The patient is not hyperactive.     There were no vitals taken for this visit.There is no height or weight on file to calculate BMI.  General Appearance: Casual  Eye Contact:  Good  Speech:  Clear and Coherent and Normal Rate  Volume:  Normal  Mood:  Anxious and Depressed  Affect:  Congruent  Thought Process:  Coherent, Goal Directed, and Descriptions of  Associations: Intact  Orientation:  Full (Time, Place, and Person)  Thought Content: WDL   Suicidal Thoughts:  No  Homicidal Thoughts:  No  Memory:  Immediate;   Good Recent;   Good Remote;   Good  Judgement:  Good  Insight:  Good  Psychomotor Activity:  Normal  Concentration:  Concentration: Good and Attention Span: Good  Recall:  Good  Fund of Knowledge: Good  Language: Good  Akathisia:  No  Handed:  Right  AIMS (if indicated): not done  Assets:  Communication Skills Desire for Improvement Financial Resources/Insurance Housing Social Support  ADL's:  Intact  Cognition: WNL  Sleep:  Poor   Screenings: GAD-7    Flowsheet Row Video Visit from 10/09/2023 in [facility] Video Visit from 07/07/2023 in [facility] Video Visit from 05/26/2023 in [facility] Video Visit from 04/14/2023 in [facility] Video Visit from 03/04/2023 in [facility]  Total GAD-7 Score 17 13 18 16 17       PHQ2-9    Flowsheet Row Video Visit from 10/09/2023 in [facility] Video Visit from 07/07/2023 in [facility] Counselor from 06/11/2023 in [facility] Video Visit from 05/26/2023 in [facility] Video Visit from 04/14/2023 in [facility]  PHQ-2 Total Score 4 3 4 6 5   PHQ-9 Total Score 15 11 16 18 18       Flowsheet Row  Video Visit from 10/09/2023 in [facility] Video Visit from 07/07/2023 in [facility] Video Visit from 05/26/2023 in [facility]  C-SSRS RISK CATEGORY Moderate Risk Low Risk No Risk        Assessment and Plan:   Tina [name] is a 52 year old, African-American female with a past psychiatric history significant for bipolar  2 disorder (major depressive episode), generalized anxiety disorder, insomnia, and tobacco use disorder who presents to [facility] via virtual video visit for follow-up and medication management.  Patient presents today encounter stating that she has been experiencing both auditory and visual hallucinations.  Her auditory hallucinations are characterized by hearing voices while her visual hallucinations are characterized by seeing shadows and dead people.  In addition to her hallucinations, patient endorses depressive episodes and anxiety.  After discussing with my supervising provider, there is some question as to whether or not patient has been truly experiencing auditory or visual hallucinations.  Patient did inform provider that she does not feel like her Kasandra Knudsen has been helpful in managing her symptoms.  Provider discussed with supervising provider about placing patient on Seroquel 50 mg at bedtime for 6 days followed by 100 mg at bedtime for the management of her depressive symptoms and for mood stability.  Discussed with supervising provider that if patient is truly experiencing auditory or visual hallucinations, then her current dosage of Seroquel would not be effective in managing those symptoms.  Provider to continue to place patient on Seroquel and to follow up to assess her symptoms.  Patient to continue taking all other medications as prescribed.  Collaboration of Care: Collaboration of Care: Medication Management AEB provider managing patient's psychiatric medications, Primary Care Provider AEB patient being seen by primary care provider at [facility], Psychiatrist AEB patient being seen by a mental health provider at this facility, Other provider involved in patient's care AEB patient being seen by Podiatry, and Referral or follow-up with counselor/therapist AEB patient being seen by a licensed clinical social worker at this  facility  Patient/Guardian was advised Release of Information must be obtained prior to any record release in order to collaborate their care with an outside provider. Patient/Guardian was advised if they have not already done so to contact the registration department to sign all necessary forms in order for [place] to release information regarding their care.   Consent: Patient/Guardian gives verbal consent for treatment and assignment of benefits for services provided during this visit. Patient/Guardian expressed understanding and agreed to proceed.   1. Generalized anxiety disorder  - busPIRone (BUSPAR) 30 MG tablet; Take 1 tablet (30 mg total) by mouth 2 (two) times daily.  Dispense: 60 tablet; Refill: 1 - amitriptyline (ELAVIL) 25 MG tablet; Take 1 tablet (25 mg total) by mouth at bedtime.  Dispense: 30 tablet; Refill: 1 - gabapentin (NEURONTIN) 400 MG capsule; Take 1 capsule (400 mg total) by mouth 3 (three) times daily.  Dispense: 90 capsule; Refill: 1  2. Bipolar 2 disorder, major depressive episode (HCC) Patient to take Seroquel 50 mg at bedtime for 6 days, followed by 100 mg at bedtime for the management of her bipolar 2 disorder (major depressive disorder)  - lamoTRIgine (LAMICTAL) 100 MG tablet; Take 1 tablet (100 mg total) by mouth daily.  Dispense: 30 tablet; Refill: 1 - amitriptyline (ELAVIL) 25 MG tablet; Take 1 tablet (25 mg total) by mouth at bedtime.  Dispense: 30 tablet; Refill:  1 - gabapentin (NEURONTIN) 400 MG capsule; Take 1 capsule (400 mg total) by mouth 3 (three) times daily.  Dispense: 90 capsule; Refill: 1  Patient to follow-up in 6 weeks Provider spent a total of 19 minutes with the patient/reviewing patient's chart  Meta Hatchet, PA 10/12/2023, 2:36 PM

## 2023-10-12 ENCOUNTER — Encounter (HOSPITAL_COMMUNITY): Payer: Self-pay | Admitting: Physician Assistant

## 2023-10-13 ENCOUNTER — Other Ambulatory Visit (HOSPITAL_COMMUNITY): Payer: Self-pay

## 2023-10-17 DIAGNOSIS — M5442 Lumbago with sciatica, left side: Secondary | ICD-10-CM | POA: Diagnosis not present

## 2023-10-17 DIAGNOSIS — M5440 Lumbago with sciatica, unspecified side: Secondary | ICD-10-CM | POA: Diagnosis not present

## 2023-10-29 ENCOUNTER — Other Ambulatory Visit (HOSPITAL_COMMUNITY): Payer: Self-pay

## 2023-10-29 ENCOUNTER — Other Ambulatory Visit: Payer: Self-pay

## 2023-10-30 ENCOUNTER — Other Ambulatory Visit: Payer: Self-pay

## 2023-10-30 ENCOUNTER — Other Ambulatory Visit (HOSPITAL_COMMUNITY): Payer: Self-pay

## 2023-11-01 ENCOUNTER — Other Ambulatory Visit (HOSPITAL_COMMUNITY): Payer: Self-pay

## 2023-11-02 ENCOUNTER — Ambulatory Visit (INDEPENDENT_AMBULATORY_CARE_PROVIDER_SITE_OTHER): Payer: Medicare Other | Admitting: Internal Medicine

## 2023-11-02 ENCOUNTER — Other Ambulatory Visit: Payer: Self-pay

## 2023-11-02 ENCOUNTER — Ambulatory Visit (INDEPENDENT_AMBULATORY_CARE_PROVIDER_SITE_OTHER): Payer: Medicare Other | Admitting: Clinical

## 2023-11-02 ENCOUNTER — Other Ambulatory Visit (HOSPITAL_COMMUNITY): Payer: Self-pay

## 2023-11-02 VITALS — BP 137/69 | HR 91 | Wt 281.7 lb

## 2023-11-02 DIAGNOSIS — E785 Hyperlipidemia, unspecified: Secondary | ICD-10-CM

## 2023-11-02 DIAGNOSIS — F3181 Bipolar II disorder: Secondary | ICD-10-CM | POA: Diagnosis not present

## 2023-11-02 DIAGNOSIS — Z7984 Long term (current) use of oral hypoglycemic drugs: Secondary | ICD-10-CM

## 2023-11-02 DIAGNOSIS — Z794 Long term (current) use of insulin: Secondary | ICD-10-CM | POA: Diagnosis not present

## 2023-11-02 DIAGNOSIS — E1165 Type 2 diabetes mellitus with hyperglycemia: Secondary | ICD-10-CM

## 2023-11-02 DIAGNOSIS — Z309 Encounter for contraceptive management, unspecified: Secondary | ICD-10-CM | POA: Diagnosis not present

## 2023-11-02 DIAGNOSIS — Z7985 Long-term (current) use of injectable non-insulin antidiabetic drugs: Secondary | ICD-10-CM | POA: Diagnosis not present

## 2023-11-02 DIAGNOSIS — I10 Essential (primary) hypertension: Secondary | ICD-10-CM | POA: Diagnosis not present

## 2023-11-02 LAB — POCT GLYCOSYLATED HEMOGLOBIN (HGB A1C): Hemoglobin A1C: 7.5 % — AB (ref 4.0–5.6)

## 2023-11-02 LAB — POCT URINE PREGNANCY: Preg Test, Ur: NEGATIVE

## 2023-11-02 LAB — GLUCOSE, CAPILLARY: Glucose-Capillary: 168 mg/dL — ABNORMAL HIGH (ref 70–99)

## 2023-11-02 MED ORDER — INSULIN LISPRO PROT & LISPRO (75-25 MIX) 100 UNIT/ML KWIKPEN
30.0000 [IU] | PEN_INJECTOR | Freq: Two times a day (BID) | SUBCUTANEOUS | 2 refills | Status: DC
  Filled 2023-11-02 (×2): qty 18, 30d supply, fill #0
  Filled 2023-11-28: qty 18, 30d supply, fill #1
  Filled 2024-01-04: qty 18, 30d supply, fill #2

## 2023-11-02 MED ORDER — ATORVASTATIN CALCIUM 40 MG PO TABS
40.0000 mg | ORAL_TABLET | Freq: Every day | ORAL | 2 refills | Status: DC
  Filled 2023-11-02 – 2023-11-03 (×2): qty 100, 100d supply, fill #0
  Filled 2023-11-28: qty 90, 90d supply, fill #0
  Filled 2024-01-04: qty 100, 100d supply, fill #0
  Filled 2024-02-29 – 2024-04-10 (×3): qty 100, 100d supply, fill #1
  Filled 2024-06-06 – 2024-07-05 (×2): qty 100, 100d supply, fill #2

## 2023-11-02 MED ORDER — AMLODIPINE-OLMESARTAN 5-20 MG PO TABS
1.0000 | ORAL_TABLET | Freq: Every day | ORAL | 3 refills | Status: DC
  Filled 2023-11-02 – 2024-01-04 (×2): qty 90, 90d supply, fill #0
  Filled 2024-02-01 – 2024-04-03 (×6): qty 90, 90d supply, fill #1
  Filled 2024-06-06 – 2024-07-05 (×2): qty 90, 90d supply, fill #2
  Filled 2024-08-04 – 2024-09-12 (×7): qty 90, 90d supply, fill #3

## 2023-11-02 MED ORDER — OZEMPIC (2 MG/DOSE) 8 MG/3ML ~~LOC~~ SOPN
2.0000 mg | PEN_INJECTOR | SUBCUTANEOUS | 3 refills | Status: DC
  Filled 2023-11-02: qty 3, 28d supply, fill #0
  Filled 2023-11-28: qty 3, 28d supply, fill #1
  Filled 2024-01-04: qty 3, 28d supply, fill #2
  Filled 2024-02-01: qty 3, 28d supply, fill #3

## 2023-11-02 MED ORDER — MEDROXYPROGESTERONE ACETATE 104 MG/0.65ML ~~LOC~~ SUSY
104.0000 mg | PREFILLED_SYRINGE | Freq: Once | SUBCUTANEOUS | Status: AC
  Administered 2023-11-02: 104 mg via SUBCUTANEOUS

## 2023-11-02 MED ORDER — METFORMIN HCL 1000 MG PO TABS
1000.0000 mg | ORAL_TABLET | Freq: Two times a day (BID) | ORAL | 3 refills | Status: DC
  Filled 2023-11-02 – 2024-01-04 (×3): qty 60, 30d supply, fill #0
  Filled 2024-02-01: qty 60, 30d supply, fill #1
  Filled 2024-03-02: qty 60, 30d supply, fill #2
  Filled 2024-04-03: qty 60, 30d supply, fill #3

## 2023-11-02 NOTE — Addendum Note (Signed)
Addended by: Derrek Monaco on: 11/02/2023 01:40 PM   Modules accepted: Level of Service

## 2023-11-02 NOTE — Patient Instructions (Signed)
It was a pleasure to care for you today!  I am increasing your humalog dose to 30 units twice daily.  Please follow up in about 3 months for diabetes follow up and depo provera injection.  My best, Dr. August Saucer

## 2023-11-02 NOTE — Assessment & Plan Note (Signed)
BP 137/69. OP regimen is amlodipine-olmesartan 5-20 mg daily which she is compliant with. BMP last checked 08/2023 with normal renal function and electrolytes. Plan:Continue current regimen.

## 2023-11-02 NOTE — Progress Notes (Signed)
Internal Medicine Clinic Attending  I was physically present during the key portions of the resident provided service and participated in the medical decision making of patient's management care. I reviewed pertinent patient test results.  The assessment, diagnosis, and plan were formulated together and I agree with the documentation in the resident's note.  Mercie Eon, MD

## 2023-11-02 NOTE — Assessment & Plan Note (Signed)
OP regimen is atorvastatin 40 mg daily which she is compliant with. Lipid panel last checked 08/2023 with LDL at goal, 93.  Plan:Continue current regimen.

## 2023-11-02 NOTE — Addendum Note (Signed)
Addended by: Maura Crandall on: 11/02/2023 02:39 PM   Modules accepted: Orders

## 2023-11-02 NOTE — Assessment & Plan Note (Signed)
Overdue since 12/03. POC pregnancy test negative. No abnormal uterine bleeding, other problems. Plan: Depo injection given today. F/u in 3 months.

## 2023-11-02 NOTE — Progress Notes (Signed)
   CC: depo provera injection and HbA1c check  HPI:  [name] is a 52 y.o. female with past medical history as detailed below who presents for depo provera injection and HbA1c check. Please see problem based charting for detailed assessment and plan.  Past Medical History:  Diagnosis Date   Diabetes mellitus    type II   Fibroids    HTN (hypertension)    Hyperlipidemia    Iron deficiency anemia    Left acetabular fracture (HCC)    Lumbar strain    Obesity    Ovarian cyst    Review of Systems:  Negative unless otherwise stated.  Physical Exam:  Vitals:   11/02/23 0851  BP: 137/69  Pulse: 91  SpO2: 100%  Weight: 281 lb 11.2 oz (127.8 kg)   Physical Exam Constitutional:      General: She is not in acute distress.    Appearance: Normal appearance. She is not ill-appearing.  Cardiovascular:     Rate and Rhythm: Normal rate and regular rhythm.  Pulmonary:     Effort: Pulmonary effort is normal. No respiratory distress.     Breath sounds: Normal breath sounds.  Musculoskeletal:     Right lower leg: No edema.     Left lower leg: No edema.  Skin:    General: Skin is warm and dry.  Neurological:     General: No focal deficit present.     Mental Status: She is alert and oriented to person, place, and time.  Psychiatric:        Mood and Affect: Mood normal.        Behavior: Behavior normal.    Assessment & Plan:   See Encounters Tab for problem based charting.  Hypertension BP 137/69. OP regimen is amlodipine-olmesartan 5-20 mg daily which she is compliant with. BMP last checked 08/2023 with normal renal function and electrolytes. Plan:Continue current regimen.  Type 2 diabetes mellitus (HCC) HbA1c last checked 08/2023 7.4%, today 7.5%. OP regimen is semaglutide 2 mg weekly, metformin 1000 mg BID, humalog 28 units BID. Urine microalbumin/creatinine ratio last checked 03/2023 normal.  Review of Freestyle Libre report: 12/17-12/30 Very high  0% High 26% Target 74% Low 0% Very low 0%  AM fasting sugars average around 130-140 and her peak hyperglycemic times are around 12 PM and 9 PM. Plan:  Continue semaglutide 2 mg weekly, metformin 1000 mg BID. Increase humalog to 30 units BID. F/u in 3 months.  Dyslipidemia OP regimen is atorvastatin 40 mg daily which she is compliant with. Lipid panel last checked 08/2023 with LDL at goal, 93.  Plan:Continue current regimen.  Encounter for contraceptive management Overdue since 12/03. POC pregnancy test negative. No abnormal uterine bleeding, other problems. Plan: Depo injection given today. F/u in 3 months.  Patient discussed with Dr.  Lafonda Mosses

## 2023-11-02 NOTE — Progress Notes (Signed)
   THERAPIST PROGRESS NOTE Virtual Visit via Video Note  I connected with Tina [name] on 11/02/23 at  8:00 AM EST by a video enabled telemedicine application and verified that I am speaking with the correct person using two identifiers.  Location: Patient: home Provider: Office   I discussed the limitations of evaluation and management by telemedicine and the availability of in person appointments. The patient expressed understanding and agreed to proceed.   Follow Up Instructions: I discussed the assessment and treatment plan with the patient. The patient was provided an opportunity to ask questions and all were answered. The patient agreed with the plan and demonstrated an understanding of the instructions.   The patient was advised to call back or seek an in-person evaluation if the symptoms worsen or if the condition fails to improve as anticipated.   Session Time: 16 minutes  Participation Level: Active  Behavioral Response: CasualAlertDepressed  Type of Therapy: Individual Therapy  Treatment Goals addressed: [place] WILL IDENTIFY 3 COGNITIVE PATTERNS AND BELIEFS THAT SUPPORT DEPRESSION   ProgressTowards Goals: Progressing  Interventions: CBT and Supportive  Summary:  Tina [name] is a 52 y.o. female who presents for the scheduled appointment oriented x 5, appropriately dressed and friendly.  Client denied hallucinations and delusions. Client reported on today she is doing fairly okay.  Client reported she also booked a doctor's appointment this morning and will have to end the session early.  Client reported she has been having some pain with her leg and back that she will tell her medical doctor about this morning.  Client reported she spent Christmas alone as her brother was out of town with some friends.  Client reported she was fine with that because she also has his own life and people to enjoy.  Client reported Christmas does make her  reflective of her mom and causes some depression.  Client reported since her mother's passing her family does not communicate and they are toxic.  Client reported otherwise she is doing fairly okay maintaining on her own. Evidence of progress towards goal: Client reported 1 cognitive pattern of depression which includes grief.  Suicidal/Homicidal: Nowithout intent/plan  Therapist Response:  Therapist began the appointment asking the client how she has been on since last seen. Therapist used CBT to engage with active listening and positive emotional support. Therapist used CBT to engage the client and ask her how she coped through the holiday and contributing factors that cause negative emotions. Therapist used CBT to normalize symptoms of grief and how to reframe her thoughts. Therapist used CBT ask the client to identify her progress with frequency of use with coping skills with continued practice in her daily activity.    Therapist assigned client homework to practice self-care.   Plan: Return again in 4 weeks.  Diagnosis: Bipolar 2 major depressive episode  Collaboration of Care: Patient refused AEB none requested by the client.  Patient/Guardian was advised Release of Information must be obtained prior to any record release in order to collaborate their care with an outside provider. Patient/Guardian was advised if they have not already done so to contact the registration department to sign all necessary forms in order for [place] to release information regarding their care.   Consent: Patient/Guardian gives verbal consent for treatment and assignment of benefits for services provided during this visit. Patient/Guardian expressed understanding and agreed to proceed.   Neena Rhymes Yareni Creps, LCSW 11/02/2023

## 2023-11-02 NOTE — Assessment & Plan Note (Signed)
HbA1c last checked 08/2023 7.4%, today 7.5%. OP regimen is semaglutide 2 mg weekly, metformin 1000 mg BID, humalog 28 units BID. Urine microalbumin/creatinine ratio last checked 03/2023 normal.  Review of [facility] report: 12/17-12/30 Very high 0% High 26% Target 74% Low 0% Very low 0%  AM fasting sugars average around 130-140 and her peak hyperglycemic times are around 12 PM and 9 PM. Plan:  Continue semaglutide 2 mg weekly, metformin 1000 mg BID. Increase humalog to 30 units BID. F/u in 3 months.

## 2023-11-03 ENCOUNTER — Other Ambulatory Visit (HOSPITAL_COMMUNITY): Payer: Self-pay

## 2023-11-03 ENCOUNTER — Other Ambulatory Visit: Payer: Self-pay

## 2023-11-27 ENCOUNTER — Ambulatory Visit: Payer: Medicare Other | Admitting: Podiatry

## 2023-11-30 ENCOUNTER — Other Ambulatory Visit: Payer: Self-pay

## 2023-11-30 ENCOUNTER — Other Ambulatory Visit (HOSPITAL_COMMUNITY): Payer: Self-pay

## 2023-12-07 ENCOUNTER — Other Ambulatory Visit (HOSPITAL_COMMUNITY): Payer: Self-pay | Admitting: Physician Assistant

## 2023-12-07 ENCOUNTER — Other Ambulatory Visit (HOSPITAL_COMMUNITY): Payer: Self-pay

## 2023-12-07 ENCOUNTER — Other Ambulatory Visit: Payer: Self-pay

## 2023-12-07 DIAGNOSIS — F3181 Bipolar II disorder: Secondary | ICD-10-CM

## 2023-12-08 ENCOUNTER — Other Ambulatory Visit (HOSPITAL_COMMUNITY): Payer: Self-pay

## 2023-12-08 MED ORDER — QUETIAPINE FUMARATE 50 MG PO TABS
ORAL_TABLET | ORAL | 1 refills | Status: DC
  Filled 2023-12-08: qty 60, 30d supply, fill #0

## 2023-12-10 ENCOUNTER — Ambulatory Visit (INDEPENDENT_AMBULATORY_CARE_PROVIDER_SITE_OTHER): Payer: Medicare Other | Admitting: Clinical

## 2023-12-10 DIAGNOSIS — F3181 Bipolar II disorder: Secondary | ICD-10-CM
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
## 2023-12-16 ENCOUNTER — Telehealth (HOSPITAL_COMMUNITY): Payer: Medicare Other | Admitting: Physician Assistant

## 2023-12-16 ENCOUNTER — Other Ambulatory Visit: Payer: Self-pay

## 2023-12-16 ENCOUNTER — Other Ambulatory Visit (HOSPITAL_COMMUNITY): Payer: Self-pay

## 2023-12-16 ENCOUNTER — Encounter (HOSPITAL_COMMUNITY): Payer: Self-pay | Admitting: Physician Assistant

## 2023-12-16 DIAGNOSIS — F411 Generalized anxiety disorder: Secondary | ICD-10-CM

## 2023-12-16 DIAGNOSIS — F3181 Bipolar II disorder: Secondary | ICD-10-CM

## 2023-12-16 MED ORDER — GABAPENTIN 400 MG PO CAPS
400.0000 mg | ORAL_CAPSULE | Freq: Three times a day (TID) | ORAL | 1 refills | Status: DC
  Filled 2023-12-16 – 2024-01-04 (×2): qty 90, 30d supply, fill #0

## 2023-12-16 MED ORDER — QUETIAPINE FUMARATE 200 MG PO TABS
200.0000 mg | ORAL_TABLET | Freq: Every day | ORAL | 1 refills | Status: DC
  Filled 2023-12-16 – 2024-01-04 (×2): qty 60, 60d supply, fill #0

## 2023-12-16 MED ORDER — LAMOTRIGINE 100 MG PO TABS
100.0000 mg | ORAL_TABLET | Freq: Every day | ORAL | 1 refills | Status: DC
  Filled 2023-12-16 – 2024-01-04 (×3): qty 30, 30d supply, fill #0

## 2023-12-16 MED ORDER — AMITRIPTYLINE HCL 25 MG PO TABS
25.0000 mg | ORAL_TABLET | Freq: Every day | ORAL | 1 refills | Status: DC
  Filled 2024-01-04: qty 30, 30d supply, fill #0

## 2023-12-16 MED ORDER — BUSPIRONE HCL 30 MG PO TABS
30.0000 mg | ORAL_TABLET | Freq: Two times a day (BID) | ORAL | 1 refills | Status: DC
  Filled 2023-12-16 – 2024-01-04 (×2): qty 60, 30d supply, fill #0

## 2023-12-16 NOTE — Progress Notes (Signed)
BH MD/PA/NP OP Progress Note  Virtual Visit via Video Note  I connected with Tina [name] on 12/16/23 at 10:30 AM EST by a video enabled telemedicine application and verified that I am speaking with the correct person using two identifiers.  Location: Patient: Home Provider: Clinic   I discussed the limitations of evaluation and management by telemedicine and the availability of in person appointments. The patient expressed understanding and agreed to proceed.  Follow Up Instructions:   I discussed the assessment and treatment plan with the patient. The patient was provided an opportunity to ask questions and all were answered. The patient agreed with the plan and demonstrated an understanding of the instructions.   The patient was advised to call back or seek an in-person evaluation if the symptoms worsen or if the condition fails to improve as anticipated.  I provided 19 minutes of non-face-to-face time during this encounter.  Meta Hatchet, PA   12/16/2023 3:24 PM Ravyn Nikkel  MRN:  [id number]  Chief Complaint:  Chief Complaint  Patient presents with   Follow-up   Medication Management   HPI:   Tina [name] is a 53 year old, African-American female with a past psychiatric history significant for bipolar 2 disorder emphases major depressive episode), generalized anxiety disorder, insomnia, and tobacco use disorder who presents to [facility] via virtual video visit for follow-up and medication management.  Patient is currently being managed on the following psychiatric medications:  Seroquel 100 mg at bedtime Amitriptyline 25 mg at bedtime Gabapentin 400 mg 3 times daily Buspirone 30 mg 2 times daily Lamotrigine 100 mg daily  Patient reports that her medications have been partially helpful.  She continues to endorse ongoing depression stating that every day she has been depressed about  something.  Patient rates her depression an 8 out of 10 with 10 being most severe.  Patient endorses depressive episodes every other day.  Patient endorses the following depressive symptoms: feelings of sadness, crying spells, lack of motivation, and irritability.  Patient denies feelings of guilt/worthlessness or hopelessness.  Patient endorses occasionally hearing and seeing things.  Her visual hallucinations are characterized by seeing things in her peripheral vision.  She reports that she last heard something yesterday.  Patient reports that her sleep is okay but she still is up some of the night.  Patient states that her sleep has been really off.  Patient states that she often thinks a lot and feels that that has something to do with her sleep.  A PHQ-9 screen was performed the patient scoring a 17.  A GAD-7 screen was also performed with the patient scoring an 18.  Patient is alert and oriented x 4, calm, cooperative, and fully engaged in conversation during the encounter.  Patient describes her mood as "blah."  Patient denies suicidal or homicidal ideations.  She further denies active auditory or visual hallucinations and does not appear to be responding to internal/external stimuli.  Patient endorses poor sleep and receives on average 2 to 3 hours of sleep per night.  Patient endorses fair appetite and eats on average 3 meals per day.  Patient denies alcohol consumption or illicit drug use.  Patient endorses tobacco use and smokes on average 1/2 pack/day.  Visit Diagnosis:    ICD-10-CM   1. Bipolar 2 disorder, major depressive episode (HCC)  F31.81 QUEtiapine (SEROQUEL) 200 MG tablet    gabapentin (NEURONTIN) 400 MG capsule    lamoTRIgine (LAMICTAL) 100 MG tablet  amitriptyline (ELAVIL) 25 MG tablet    2. Generalized anxiety disorder  F41.1 gabapentin (NEURONTIN) 400 MG capsule    busPIRone (BUSPAR) 30 MG tablet    amitriptyline (ELAVIL) 25 MG tablet      Past Psychiatric History:   Insomnia Tobacco use Anxiety Depression Biplor 2 disorder  Past Medical History:  Past Medical History:  Diagnosis Date   Diabetes mellitus    type II   Fibroids    HTN (hypertension)    Hyperlipidemia    Iron deficiency anemia    Left acetabular fracture (HCC)    Lumbar strain    Obesity    Ovarian cyst     Past Surgical History:  Procedure Laterality Date   FOOT ARTHRODESIS, MODIFIED MCBRIDE     right foot bunion surgery and ankle surgery 1980s   ORIF ACETABULAR FRACTURE     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    Family Psychiatric History:  Niece - Schizophrenia  Family History:  Family History  Problem Relation Age of Onset   Colon cancer Sister    Diabetes Mother    Diabetes Father    Diabetes Brother    Celiac disease Paternal Aunt     Social History:  Social History   Socioeconomic History   Marital status: Single    Spouse name: Not on file   Number of children: 0   Years of education: 12   Highest education level: Some college, no degree  Occupational History   Occupation: TEFL teacher: UNEMPLOYED    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, academic  Tobacco Use   Smoking status: Every Day    Current packs/day: 0.50    Average packs/day: 0.5 packs/day for 23.0 years (11.5 ttl pk-yrs)    Types: Cigarettes   Smokeless tobacco: Never   Tobacco comments:    1/2PPD  Vaping Use   Vaping status: Never Used  Substance and Sexual Activity   Alcohol use: Yes    Alcohol/week: 0.0 standard drinks of alcohol    Comment: occ   Drug use: No   Sexual activity: Yes    Partners: Female    Birth control/protection: Injection    Comment: Depo  Other Topics Concern   Not on file  Social History Narrative   Homosexual; in a monogamous relationship w/ homosexual woman.   She is disabled.  She last worked in 2018 at [employer].    Right handed   One story home   Caffeine: 1 soda daily, occas drinks coffee    Social Drivers of Corporate investment banker Strain: Low Risk   (03/05/2023)   Overall Financial Resource Strain (CARDIA)    Difficulty of Paying Living Expenses: Not very hard  Food Insecurity: Food Insecurity Present (03/05/2023)   Hunger Vital Sign    Worried About Running Out of Food in the Last Year: Sometimes true    Ran Out of Food in the Last Year: Sometimes true  Transportation Needs: No Transportation Needs (03/05/2023)   PRAPARE - Administrator, Civil Service (Medical): No    Lack of Transportation (Non-Medical): No  Physical Activity: Insufficiently Active (03/05/2023)   Exercise Vital Sign    Days of Exercise per Week: 3 days    Minutes of Exercise per Session: 30 min  Stress: Stress Concern Present (03/05/2023)   [employer] of Occupational Health - Occupational Stress Questionnaire    Feeling of Stress : To some extent  Social Connections: Moderately Isolated (03/05/2023)   Social  Connection and Isolation Panel [NHANES]    Frequency of Communication with Friends and Family: More than three times a week    Frequency of Social Gatherings with Friends and Family: Once a week    Attends Religious Services: 1 to 4 times per year    Active Member of [employer] or Organizations: No    Attends Banker Meetings: Never    Marital Status: Never married    Allergies:  Allergies  Allergen Reactions   Codeine Other (See Comments)    hallucinations   Sglt2 Inhibitors     Recurrent yeast infections on Jardiance, stopped 06/2022    Metabolic Disorder Labs: Lab Results  Component Value Date   HGBA1C 7.5 (A) 11/02/2023   MPG 200 05/25/2017   MPG 237 02/26/2016   No results found for: "PROLACTIN" Lab Results  Component Value Date   CHOL 151 08/12/2023   TRIG 149 08/12/2023   HDL 32 (L) 08/12/2023   CHOLHDL 4.7 (H) 08/12/2023   VLDL 50 (H) 08/21/2014   LDLCALC 93 08/12/2023   LDLCALC 97 03/11/2022   Lab Results  Component Value Date   TSH 1.074 06/27/2011    Therapeutic Level Labs: No results found for:  "LITHIUM" No results found for: "VALPROATE" No results found for: "CBMZ"  Current Medications: Current Outpatient Medications  Medication Sig Dispense Refill   amitriptyline (ELAVIL) 25 MG tablet Take 1 tablet (25 mg total) by mouth at bedtime. 30 tablet 1   amLODipine-olmesartan (AZOR) 5-20 MG tablet Take 1 tablet by mouth daily. 90 tablet 3   atorvastatin (LIPITOR) 40 MG tablet Take 1 tablet (40 mg total) by mouth daily. 100 tablet 2   blood glucose meter kit and supplies KIT Use up to 4 (four) times daily as directed 1 each 0   Blood Glucose Monitoring Suppl (CONTOUR NEXT ONE) KIT 1 each by Does not apply route See admin instructions. USE TO CHECK BLOOD GLUCOSE ONCE DAILY. IM PROGRAM. 1 kit 0   busPIRone (BUSPAR) 30 MG tablet Take 1 tablet (30 mg total) by mouth 2 (two) times daily. 60 tablet 1   Continuous Glucose Sensor (FREESTYLE LIBRE 3 SENSOR) MISC Place 1 sensor on the skin every 14 days. Use to check glucose continuously 6 each 3   cyclobenzaprine (FLEXERIL) 5 MG tablet Take 1 tablet (5 mg total) by mouth at bedtime as needed for muscle spasms. 30 tablet 1   gabapentin (NEURONTIN) 400 MG capsule Take 1 capsule (400 mg total) by mouth 3 (three) times daily. 90 capsule 1   glucose blood (CONTOUR NEXT TEST) test strip Please use to check your sugar 3 times a day. 100 strip 3   glucose blood test strip Use up to 4 (four) times daily as directed 100 each 0   ibuprofen (ADVIL) 800 MG tablet Take 1 tablet (800 mg total) by mouth every 8 (eight) hours as needed. 30 tablet 0   Insulin Lispro Prot & Lispro (HUMALOG MIX 75/25 KWIKPEN) (75-25) 100 UNIT/ML Kwikpen Inject 30 Units into the skin 2 (two) times daily before a meal. 18 mL 2   Insulin Pen Needle 32G X 4 MM MISC Use new pen needle with each insulin injection two times daily 200 each 4   lamoTRIgine (LAMICTAL) 100 MG tablet Take 1 tablet (100 mg total) by mouth daily. 30 tablet 1   Lancets (ONETOUCH DELICA PLUS LANCET33G) MISC Use up to 4  (four) times daily as directed 100 each 0   Lancets MISC 1  Units by Does not apply route 3 (three) times daily. 100 each 3   metFORMIN (GLUCOPHAGE) 1000 MG tablet Take 1 tablet (1,000 mg total) by mouth 2 (two) times daily with a meal. 60 tablet 3   nicotine polacrilex (NICORETTE) 4 MG gum Chew 2 pieces (8 mg total) by mouth as needed for smoking cessation. 110 tablet 2   nitroGLYCERIN (NITRO-DUR) 0.2 mg/hr patch Cut patch into one - fourth pieces. Place a one-fourth (1/4) piece of patch on skin over affected area, changing to a new piece every 24 hours. 30 patch 1   QUEtiapine (SEROQUEL) 200 MG tablet Take 1 tablet (200 mg total) by mouth at bedtime. 60 tablet 1   Semaglutide, 2 MG/DOSE, (OZEMPIC, 2 MG/DOSE,) 8 MG/3ML SOPN Inject 2 mg into the skin once a week. 3 mL 3   No current facility-administered medications for this visit.     Musculoskeletal: Strength & Muscle Tone: Unable to assess due to telemedicine visit Gait & Station: Unable to assess due to telemedicine visit Patient leans: Unable to assess due to telemedicine visit  Psychiatric Specialty Exam: Review of Systems  Psychiatric/Behavioral:  Positive for dysphoric mood and sleep disturbance. Negative for decreased concentration, hallucinations, self-injury and suicidal ideas. The patient is nervous/anxious. The patient is not hyperactive.     There were no vitals taken for this visit.There is no height or weight on file to calculate BMI.  General Appearance: Casual  Eye Contact:  Good  Speech:  Clear and Coherent and Normal Rate  Volume:  Normal  Mood:  Anxious and Depressed  Affect:  Congruent  Thought Process:  Coherent, Goal Directed, and Descriptions of Associations: Intact  Orientation:  Full (Time, Place, and Person)  Thought Content: WDL   Suicidal Thoughts:  No  Homicidal Thoughts:  No  Memory:  Immediate;   Good Recent;   Good Remote;   Good  Judgement:  Good  Insight:  Good  Psychomotor Activity:  Normal   Concentration:  Concentration: Good and Attention Span: Good  Recall:  Good  Fund of Knowledge: Good  Language: Good  Akathisia:  No  Handed:  Right  AIMS (if indicated): not done  Assets:  Communication Skills Desire for Improvement Financial Resources/Insurance Housing Social Support  ADL's:  Intact  Cognition: WNL  Sleep:  Poor   Screenings: GAD-7    Flowsheet Row Video Visit from 12/16/2023 in [facility] Video Visit from 10/09/2023 in [facility] Video Visit from 07/07/2023 in [facility] Video Visit from 05/26/2023 in [facility] Video Visit from 04/14/2023 in [facility]  Total GAD-7 Score 18 17 13 18 16       PHQ2-9    Flowsheet Row Video Visit from 12/16/2023 in [facility] Video Visit from 10/09/2023 in [facility] Video Visit from 07/07/2023 in [facility] Counselor from 06/11/2023 in [facility] Video Visit from 05/26/2023 in [facility]  PHQ-2 Total Score 5 4 3 4 6   PHQ-9 Total Score 17 15 11 16 18       Flowsheet Row Video Visit from 12/16/2023 in [facility] Video Visit from 10/09/2023 in [facility] Video Visit from 07/07/2023 in [facility]  C-SSRS RISK CATEGORY Moderate Risk Moderate Risk Low Risk        Assessment and  Plan:   Mar Zettler is a 53 year old, African-American female with a past psychiatric history significant for bipolar 2 disorder (major depressive episode), generalized anxiety disorder, insomnia, and tobacco use disorder who presents to [facility] via virtual video visit for follow-up and medication management.  Patient presents to the  encounter stating that she has been taking her medications regularly.  She reports that her medications appear to be partially helpful.  Patient continues to struggle with depressive symptoms as well as occasional auditory/visual hallucinations. Patient denied active auditory or visual hallucinations and did not appear to be responding to internal/external stimuli.  Patient also endorses issues with sleep stating that her racing thoughts contributed to her inability to sleep.  Provider recommended increasing her dosage of Seroquel from 100 mg to 200 mg at bedtime for the management of her depressive symptoms, mood stability, and her sleep.  Patient was agreeable to recommendation.  Patient's medications to be prescribed to pharmacy of choice.  Collaboration of Care: Collaboration of Care: Medication Management AEB provider managing patient's psychiatric medications, Primary Care Provider AEB patient being seen by primary care provider at [facility], Psychiatrist AEB patient being seen by a mental health provider at this facility, Other provider involved in patient's care AEB patient being seen by Podiatry, and Referral or follow-up with counselor/therapist AEB patient being seen by a licensed clinical social worker at this facility  Patient/Guardian was advised Release of Information must be obtained prior to any record release in order to collaborate their care with an outside provider. Patient/Guardian was advised if they have not already done so to contact the registration department to sign all necessary forms in order for [place] to release information regarding their care.   Consent: Patient/Guardian gives verbal consent for treatment and assignment of benefits for services provided during this visit. Patient/Guardian expressed understanding and agreed to proceed.   1. Bipolar 2 disorder, major depressive episode (HCC)  - QUEtiapine (SEROQUEL) 200 MG tablet; Take 1 tablet (200  mg total) by mouth at bedtime.  Dispense: 60 tablet; Refill: 1 - gabapentin (NEURONTIN) 400 MG capsule; Take 1 capsule (400 mg total) by mouth 3 (three) times daily.  Dispense: 90 capsule; Refill: 1 - lamoTRIgine (LAMICTAL) 100 MG tablet; Take 1 tablet (100 mg total) by mouth daily.  Dispense: 30 tablet; Refill: 1 - amitriptyline (ELAVIL) 25 MG tablet; Take 1 tablet (25 mg total) by mouth at bedtime.  Dispense: 30 tablet; Refill: 1  2. Generalized anxiety disorder  - gabapentin (NEURONTIN) 400 MG capsule; Take 1 capsule (400 mg total) by mouth 3 (three) times daily.  Dispense: 90 capsule; Refill: 1 - busPIRone (BUSPAR) 30 MG tablet; Take 1 tablet (30 mg total) by mouth 2 (two) times daily.  Dispense: 60 tablet; Refill: 1 - amitriptyline (ELAVIL) 25 MG tablet; Take 1 tablet (25 mg total) by mouth at bedtime.  Dispense: 30 tablet; Refill: 1  Patient to follow-up in 6 weeks Provider spent a total of 19 minutes with the patient/reviewing patient's chart  Meta Hatchet, PA 12/16/2023, 3:24 PM

## 2023-12-17 ENCOUNTER — Other Ambulatory Visit (HOSPITAL_COMMUNITY): Payer: Self-pay

## 2023-12-24 ENCOUNTER — Ambulatory Visit (INDEPENDENT_AMBULATORY_CARE_PROVIDER_SITE_OTHER): Payer: Medicare Other | Admitting: Clinical

## 2023-12-24 DIAGNOSIS — F3181 Bipolar II disorder: Secondary | ICD-10-CM

## 2023-12-24 NOTE — Progress Notes (Signed)
   THERAPIST PROGRESS NOTE Virtual Visit via Video Note  I connected with Naraya Stoneberg Almgren on 12/24/23 at  9:00 AM EST by a video enabled telemedicine application and verified that I am speaking with the correct person using two identifiers.  Location: Patient: home Provider: office   I discussed the limitations of evaluation and management by telemedicine and the availability of in person appointments. The patient expressed understanding and agreed to proceed.   Follow Up Instructions: I discussed the assessment and treatment plan with the patient. The patient was provided an opportunity to ask questions and all were answered. The patient agreed with the plan and demonstrated an understanding of the instructions.   The patient was advised to call back or seek an in-person evaluation if the symptoms worsen or if the condition fails to improve as anticipated.   Session Time: 25 minutes  Participation Level: Active  Behavioral Response: CasualAlertAnxious  Type of Therapy: Individual Therapy  Treatment Goals addressed: Marceline WILL SCORE LESS THAN 10 ON THE PATIENT HEALTH QUESTIONNAIRE (PHQ-9)   ProgressTowards Goals: Progressing  Interventions: CBT  Summary:  Odessie Polzin is a 53 y.o. female who presents for the scheduled appointment oriented times five, appropriately dressed and friendly. Client denied hallucinations and delusions. Client reported she is doing about the same. Client reported things at home are the same with her brother. Client reported he is mean to her and everybody else in life. Client reported she just tries to stay out of his way. Client reported she tries to keep herself grounded from being made irritable by him. Client reported her other brother whom she gets along with has been feeling sick so she is worried about him. Client reported otherwise she has been maintaining well herself and her other doctor appointments have been going  well. Evidence of progress towards goal:  client reported 1 positive of maintaining her depressive symptoms by engaging in grounding skills such as praying and meditating.  Suicidal/Homicidal: Nowithout intent/plan  Therapist Response:  Therapist began the appointment asking the client how she has been doing. Therapist used cbt to engage with active listening and positive emotional support. Therapist used cbt to engage and ask her about stressors and how she is managing them. Therapist used cbt to engage and reinforce her use of grounding/ coping skills to help with irritability. Therapist used CBT ask the client to identify her progress with frequency of use with coping skills with continued practice in her daily activity.    Therapist assigned the client homework to practice self care.   Plan: Return again in 4 weeks.  Diagnosis: bipolar 2 disorder, major depressive disorder  Collaboration of Care: Patient refused AEB none requested by the client.  Patient/Guardian was advised Release of Information must be obtained prior to any record release in order to collaborate their care with an outside provider. Patient/Guardian was advised if they have not already done so to contact the registration department to sign all necessary forms in order for [place] to release information regarding their care.   Consent: Patient/Guardian gives verbal consent for treatment and assignment of benefits for services provided during this visit. Patient/Guardian expressed understanding and agreed to proceed.   Neena Rhymes Daymion Nazaire, LCSW 12/24/2023

## 2023-12-25 ENCOUNTER — Other Ambulatory Visit (HOSPITAL_COMMUNITY): Payer: Self-pay

## 2023-12-28 ENCOUNTER — Other Ambulatory Visit (HOSPITAL_COMMUNITY): Payer: Self-pay

## 2023-12-29 ENCOUNTER — Other Ambulatory Visit (HOSPITAL_COMMUNITY): Payer: Self-pay

## 2024-01-04 ENCOUNTER — Other Ambulatory Visit (HOSPITAL_COMMUNITY): Payer: Self-pay

## 2024-01-04 ENCOUNTER — Other Ambulatory Visit: Payer: Self-pay

## 2024-01-05 ENCOUNTER — Telehealth (HOSPITAL_COMMUNITY): Payer: Self-pay | Admitting: *Deleted

## 2024-01-05 ENCOUNTER — Other Ambulatory Visit (HOSPITAL_COMMUNITY): Payer: Self-pay

## 2024-01-05 ENCOUNTER — Other Ambulatory Visit (HOSPITAL_COMMUNITY): Payer: Self-pay | Admitting: Physician Assistant

## 2024-01-05 DIAGNOSIS — F3181 Bipolar II disorder: Secondary | ICD-10-CM

## 2024-01-05 MED ORDER — LAMOTRIGINE 100 MG PO TABS
100.0000 mg | ORAL_TABLET | Freq: Every day | ORAL | 1 refills | Status: DC
  Filled 2024-01-05 – 2024-02-02 (×2): qty 30, 30d supply, fill #0
  Filled 2024-02-29: qty 30, 30d supply, fill #1

## 2024-01-05 MED ORDER — LAMOTRIGINE 25 MG PO TABS
ORAL_TABLET | ORAL | 0 refills | Status: DC
  Filled 2024-01-05: qty 42, 28d supply, fill #0

## 2024-01-05 NOTE — Telephone Encounter (Signed)
 Patient states that she can't get her Lamictal refilled at [facility] because they won't give it to her. Called pharmacy spoke with Karie Mainland who states that she has not had it filled since 12/30 and will not refill without MD approval. Patient states she has been taking it and believes she got it filled in February however she denies using any other pharmacy. State she only has a few pills left. Pharmacy states they attempted to fill it on 2/12 but sent note to MD for approval and had to restock it on 2/24 when no answer was given. They want to know if it should be filled at 100mg  if patient is indeed noncompliant.

## 2024-01-05 NOTE — Progress Notes (Signed)
 Provider was contacted by Lorina Rabon K. Romeo Apple, [facility] regarding patient's Lamictal.  Provider was sent the following message:  We received a Rx for lamotrigine 100mg  daily. Per pt she has not been taking this regularly and per our records last filled 12/30 for a 30 day supply. Reaching out to see if you will send the initial titration prescription 25 mg daily x 14 days, then 50mg  daily x 14 days, then 100mg  daily thereafter. Thank you.  Provider to send initial titration prescription for patient to preferred pharmacy.

## 2024-01-15 DIAGNOSIS — F17200 Nicotine dependence, unspecified, uncomplicated: Secondary | ICD-10-CM | POA: Diagnosis not present

## 2024-01-15 DIAGNOSIS — L259 Unspecified contact dermatitis, unspecified cause: Secondary | ICD-10-CM | POA: Diagnosis not present

## 2024-01-19 ENCOUNTER — Other Ambulatory Visit (HOSPITAL_COMMUNITY): Payer: Self-pay

## 2024-01-20 NOTE — Telephone Encounter (Signed)
 Message acknowledged and reviewed. Provider filled prescription with instructions on tapering the medication.

## 2024-01-21 ENCOUNTER — Ambulatory Visit (HOSPITAL_COMMUNITY): Payer: Medicare Other | Admitting: Clinical

## 2024-01-21 ENCOUNTER — Encounter (HOSPITAL_COMMUNITY): Payer: Self-pay

## 2024-01-21 DIAGNOSIS — F3181 Bipolar II disorder: Secondary | ICD-10-CM

## 2024-01-21 NOTE — Progress Notes (Signed)
 THERAPIST PROGRESS NOTE Virtual Visit via Video Note  I connected with Helaine Yackel Reder on 01/21/24 at  8:00 AM EDT by a video enabled telemedicine application and verified that I am speaking with the correct person using two identifiers.  Location: Patient: home Provider: office   I discussed the limitations of evaluation and management by telemedicine and the availability of in person appointments. The patient expressed understanding and agreed to proceed.   Follow Up Instructions: I discussed the assessment and treatment plan with the patient. The patient was provided an opportunity to ask questions and all were answered. The patient agreed with the plan and demonstrated an understanding of the instructions.   The patient was advised to call back or seek an in-person evaluation if the symptoms worsen or if the condition fails to improve as anticipated.   Session Time: 20 minutes  Participation Level: Active  Behavioral Response: CasualAlertDepressed  Type of Therapy: Individual Therapy  Treatment Goals addressed: Reduce frequency, intensity, and duration of depression symptoms as evidenced by: the clients self 1x per session   ProgressTowards Goals: Progressing  Interventions: CBT  Summary:  Rokhaya Quinn is a 53 y.o. female who presents for the scheduled appointment oriented x 5, appropriately dressed, and friendly.  Client denied hallucinations and delusions. Client reported on today she has been doing her best to manage.  Client reported last week was the anniversary of her mother's passing.  Client reported she and her other brother who lives outside of the home went to visit her mother's grave to put flowers and clean up her plot.  Client reported the brother who lives with her is very Holiday representative.  Client reported the brother who she lives with always makes comments about her going to do the maintenance of their mothers grave.  Client  reported she does not because that something that is important to her and he has to understand that he has to pay his own respects to their mother.  Client reported she tries to keep herself while in the house so she does not get pulled into an argument that her brother tries to provoke.  Client reported she recently went to the hospital because she had unexplained hives on her leg.  Client reported it scared her because she is a diabetic and she has never had that happen her before.  Client reported she received some medication and her condition is improving. Evidence of progress towards goal: Client reported 1 positive of maintaining boundaries as well as 1 positive of doing something that is positive to help her with the continuous ability to cope with grief of her mother.  Suicidal/Homicidal: Nowithout intent/plan  Therapist Response:  Therapist began the appointment asking the client how she has been doing since last seen. Therapist used CBT to engage with active listening and positive emotional support. Therapist used CBT to ask the client about her current ability to manage negative emotions that are triggered by things outside of her control. Therapist used CBT to positively reinforce the clients problem-solving skills and practice of activities that help her cope with anger and grief. Therapist used CBT ask the client to identify her progress with frequency of use with coping skills with continued practice in her daily activity.    Therapist assigned client homework to practice self-care.   Plan: Return again in 4 weeks.  Diagnosis: bipolar 2 disorder, major depressive episode  Collaboration of Care: Patient refused AEB none requested by the client.  Patient/Guardian was advised  Release of Information must be obtained prior to any record release in order to collaborate their care with an outside provider. Patient/Guardian was advised if they have not already done so to contact the  registration department to sign all necessary forms in order for [place] to release information regarding their care.   Consent: Patient/Guardian gives verbal consent for treatment and assignment of benefits for services provided during this visit. Patient/Guardian expressed understanding and agreed to proceed.   Neena Rhymes Iliyana Convey, LCSW 01/21/2024

## 2024-01-27 ENCOUNTER — Encounter (HOSPITAL_COMMUNITY): Payer: Self-pay | Admitting: Physician Assistant

## 2024-01-27 ENCOUNTER — Other Ambulatory Visit (HOSPITAL_COMMUNITY): Payer: Self-pay | Admitting: Physician Assistant

## 2024-01-27 ENCOUNTER — Telehealth (HOSPITAL_COMMUNITY): Payer: Medicare Other | Admitting: Physician Assistant

## 2024-01-27 DIAGNOSIS — F3181 Bipolar II disorder: Secondary | ICD-10-CM | POA: Diagnosis not present

## 2024-01-27 DIAGNOSIS — F411 Generalized anxiety disorder: Secondary | ICD-10-CM

## 2024-01-27 MED ORDER — GABAPENTIN 400 MG PO CAPS
400.0000 mg | ORAL_CAPSULE | Freq: Three times a day (TID) | ORAL | 1 refills | Status: DC
  Filled 2024-01-27 – 2024-02-03 (×2): qty 90, 30d supply, fill #0
  Filled 2024-03-02: qty 90, 30d supply, fill #1

## 2024-01-27 MED ORDER — BUSPIRONE HCL 30 MG PO TABS
30.0000 mg | ORAL_TABLET | Freq: Two times a day (BID) | ORAL | 1 refills | Status: DC
Start: 2024-01-27 — End: 2024-03-09
  Filled 2024-01-27 – 2024-02-03 (×2): qty 60, 30d supply, fill #0
  Filled 2024-03-02: qty 60, 30d supply, fill #1

## 2024-01-27 MED ORDER — AMITRIPTYLINE HCL 25 MG PO TABS
25.0000 mg | ORAL_TABLET | Freq: Every day | ORAL | 1 refills | Status: DC
  Filled 2024-01-27 – 2024-02-03 (×2): qty 30, 30d supply, fill #0
  Filled 2024-03-02: qty 30, 30d supply, fill #1

## 2024-01-27 MED ORDER — QUETIAPINE FUMARATE 300 MG PO TABS
300.0000 mg | ORAL_TABLET | Freq: Every day | ORAL | 1 refills | Status: DC
  Filled 2024-01-27: qty 30, 30d supply, fill #0
  Filled 2024-02-29: qty 30, 30d supply, fill #1

## 2024-01-27 NOTE — Progress Notes (Signed)
 BH MD/PA/NP OP Progress Note  Virtual Visit via Video Note  I connected with Tina [name] on 01/27/24 at 11:30 AM EDT by a video enabled telemedicine application and verified that I am speaking with the correct person using two identifiers.  Location: Patient: Home Provider: Clinic   I discussed the limitations of evaluation and management by telemedicine and the availability of in person appointments. The patient expressed understanding and agreed to proceed.  Follow Up Instructions:   I discussed the assessment and treatment plan with the patient. The patient was provided an opportunity to ask questions and all were answered. The patient agreed with the plan and demonstrated an understanding of the instructions.   The patient was advised to call back or seek an in-person evaluation if the symptoms worsen or if the condition fails to improve as anticipated.  I provided 15 minutes of non-face-to-face time during this encounter.  Meta Hatchet, PA   01/27/2024 6:58 PM Tina [name]  MRN:  [id number]  Chief Complaint:  Chief Complaint  Patient presents with   Follow-up   Medication Management   HPI:   Tina [name] is a 53 year old, African-American female with a past psychiatric history significant for bipolar 2 disorder emphases major depressive episode), generalized anxiety disorder, insomnia, and tobacco use disorder who presents to [facility] via virtual video visit for follow-up and medication management.  Patient is currently being managed on the following psychiatric medications:  Seroquel 200 mg at bedtime Amitriptyline 25 mg at bedtime Gabapentin 400 mg 3 times daily Buspirone 30 mg 2 times daily Lamotrigine 100 mg daily  Patient presents today encounter continuing to endorse auditory hallucinations that are negative in nature.  Patient reports that the auditory hallucinations say the  following: "you should kill yourself, you need to kill yourself, your family does not love you."  In addition to auditory hallucinations, patient also endorses visual hallucinations characterized by seeing shadows in the peripheral of her field of vision.  She also endorses delusions on occasion characterized by thinking things are not real.  Patient endorses depression and rates her depression an 8 out of 10 with 10 being best severe.  Patient endorses depressive episodes every other day.  Patient endorses the following depressive symptoms: feelings of sadness and lack of motivation.  Patient's depression is attributed to the anniversary of her mother's passing.  Since the date of her mother's passing, patient has been down and not wanting to be bothered by anyone.  Patient also endorses anxiety and rates her anxiety a 7 out of 10.  She reports that her anxiety is alleviated when taking her BuSpar regularly.  A PHQ-9 screen was performed the patient scoring a 17.  A GAD-7 screen was also performed with the patient scoring a 16.  Patient is alert and oriented x 4, calm, cooperative, and fully engaged in conversation during the encounter.  Patient describes her mood as "blah." patient exhibits depressed mood with congruent affect.  Patient denies suicidal or homicidal ideations.  She further denies active auditory or visual hallucinations and does not appear to be responding to internal/external stimuli.  Patient endorses poor sleep and receives on average 2 hours of sleep per night.  Patient states that she often snores when she sleeps and finds herself gasping for air at times upon waking.  Patient endorses good appetite and eats on average 3 meals per day.  Patient denies alcohol consumption or illicit drug use.  Patient endorses  tobacco use and smokes on average 1/2 pack/day.  Visit Diagnosis:    ICD-10-CM   1. Bipolar 2 disorder, major depressive episode (HCC)  F31.81 QUEtiapine (SEROQUEL) 300 MG tablet     gabapentin (NEURONTIN) 400 MG capsule    amitriptyline (ELAVIL) 25 MG tablet    2. Generalized anxiety disorder  F41.1 gabapentin (NEURONTIN) 400 MG capsule    busPIRone (BUSPAR) 30 MG tablet    amitriptyline (ELAVIL) 25 MG tablet      Past Psychiatric History:  Insomnia Tobacco use Anxiety Depression Biplor 2 disorder  Past Medical History:  Past Medical History:  Diagnosis Date   Diabetes mellitus    type II   Fibroids    HTN (hypertension)    Hyperlipidemia    Iron deficiency anemia    Left acetabular fracture (HCC)    Lumbar strain    Obesity    Ovarian cyst     Past Surgical History:  Procedure Laterality Date   FOOT ARTHRODESIS, MODIFIED MCBRIDE     right foot bunion surgery and ankle surgery 1980s   ORIF ACETABULAR FRACTURE     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    Family Psychiatric History:  Niece - Schizophrenia  Family History:  Family History  Problem Relation Age of Onset   Colon cancer Sister    Diabetes Mother    Diabetes Father    Diabetes Brother    Celiac disease Paternal Aunt     Social History:  Social History   Socioeconomic History   Marital status: Single    Spouse name: Not on file   Number of children: 0   Years of education: 12   Highest education level: Some college, no degree  Occupational History   Occupation: TEFL teacher: UNEMPLOYED    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, academic  Tobacco Use   Smoking status: Every Day    Current packs/day: 0.50    Average packs/day: 0.5 packs/day for 23.0 years (11.5 ttl pk-yrs)    Types: Cigarettes   Smokeless tobacco: Never   Tobacco comments:    1/2PPD  Vaping Use   Vaping status: Never Used  Substance and Sexual Activity   Alcohol use: Yes    Alcohol/week: 0.0 standard drinks of alcohol    Comment: occ   Drug use: No   Sexual activity: Yes    Partners: Female    Birth control/protection: Injection    Comment: Depo  Other Topics Concern   Not on file  Social History Narrative    Homosexual; in a monogamous relationship w/ homosexual woman.   She is disabled.  She last worked in 2018 at [employer].    Right handed   One story home   Caffeine: 1 soda daily, occas drinks coffee    Social Drivers of Corporate investment banker Strain: Low Risk  (03/05/2023)   Overall Financial Resource Strain (CARDIA)    Difficulty of Paying Living Expenses: Not very hard  Food Insecurity: Food Insecurity Present (03/05/2023)   Hunger Vital Sign    Worried About Running Out of Food in the Last Year: Sometimes true    Ran Out of Food in the Last Year: Sometimes true  Transportation Needs: No Transportation Needs (03/05/2023)   PRAPARE - Administrator, Civil Service (Medical): No    Lack of Transportation (Non-Medical): No  Physical Activity: Insufficiently Active (03/05/2023)   Exercise Vital Sign    Days of Exercise per Week: 3 days  Minutes of Exercise per Session: 30 min  Stress: Stress Concern Present (03/05/2023)   [employer] of Occupational Health - Occupational Stress Questionnaire    Feeling of Stress : To some extent  Social Connections: Moderately Isolated (03/05/2023)   Social Connection and Isolation Panel [NHANES]    Frequency of Communication with Friends and Family: More than three times a week    Frequency of Social Gatherings with Friends and Family: Once a week    Attends Religious Services: 1 to 4 times per year    Active Member of [employer] or Organizations: No    Attends Banker Meetings: Never    Marital Status: Never married    Allergies:  Allergies  Allergen Reactions   Codeine Other (See Comments)    hallucinations   Sglt2 Inhibitors     Recurrent yeast infections on Jardiance, stopped 06/2022    Metabolic Disorder Labs: Lab Results  Component Value Date   HGBA1C 7.5 (A) 11/02/2023   MPG 200 05/25/2017   MPG 237 02/26/2016   No results found for: "PROLACTIN" Lab Results  Component Value Date   CHOL 151 08/12/2023    TRIG 149 08/12/2023   HDL 32 (L) 08/12/2023   CHOLHDL 4.7 (H) 08/12/2023   VLDL 50 (H) 08/21/2014   LDLCALC 93 08/12/2023   LDLCALC 97 03/11/2022   Lab Results  Component Value Date   TSH 1.074 06/27/2011    Therapeutic Level Labs: No results found for: "LITHIUM" No results found for: "VALPROATE" No results found for: "CBMZ"  Current Medications: Current Outpatient Medications  Medication Sig Dispense Refill   amitriptyline (ELAVIL) 25 MG tablet Take 1 tablet (25 mg total) by mouth at bedtime. 30 tablet 1   amLODipine-olmesartan (AZOR) 5-20 MG tablet Take 1 tablet by mouth daily. 90 tablet 3   atorvastatin (LIPITOR) 40 MG tablet Take 1 tablet (40 mg total) by mouth daily. 100 tablet 2   blood glucose meter kit and supplies KIT Use up to 4 (four) times daily as directed 1 each 0   Blood Glucose Monitoring Suppl (CONTOUR NEXT ONE) KIT 1 each by Does not apply route See admin instructions. USE TO CHECK BLOOD GLUCOSE ONCE DAILY. IM PROGRAM. 1 kit 0   busPIRone (BUSPAR) 30 MG tablet Take 1 tablet (30 mg total) by mouth 2 (two) times daily. 60 tablet 1   Continuous Glucose Sensor (FREESTYLE LIBRE 3 SENSOR) MISC Place 1 sensor on the skin every 14 days. Use to check glucose continuously 6 each 3   cyclobenzaprine (FLEXERIL) 5 MG tablet Take 1 tablet (5 mg total) by mouth at bedtime as needed for muscle spasms. 30 tablet 1   gabapentin (NEURONTIN) 400 MG capsule Take 1 capsule (400 mg total) by mouth 3 (three) times daily. 90 capsule 1   glucose blood (CONTOUR NEXT TEST) test strip Please use to check your sugar 3 times a day. 100 strip 3   glucose blood test strip Use up to 4 (four) times daily as directed 100 each 0   ibuprofen (ADVIL) 800 MG tablet Take 1 tablet (800 mg total) by mouth every 8 (eight) hours as needed. 30 tablet 0   Insulin Lispro Prot & Lispro (HUMALOG MIX 75/25 KWIKPEN) (75-25) 100 UNIT/ML Kwikpen Inject 30 Units into the skin 2 (two) times daily before a meal. 18 mL 2    Insulin Pen Needle 32G X 4 MM MISC Use new pen needle with each insulin injection two times daily 200 each 4   [  START ON 02/02/2024] lamoTRIgine (LAMICTAL) 100 MG tablet Take 1 tablet (100 mg total) by mouth daily. 30 tablet 1   lamoTRIgine (LAMICTAL) 25 MG tablet Take 1 tablet (25 mg total) by mouth daily for 14 days, THEN 2 tablets (50 mg total) daily for 14 days. 42 tablet 0   Lancets (ONETOUCH DELICA PLUS LANCET33G) MISC Use up to 4 (four) times daily as directed 100 each 0   Lancets MISC 1 Units by Does not apply route 3 (three) times daily. 100 each 3   metFORMIN (GLUCOPHAGE) 1000 MG tablet Take 1 tablet (1,000 mg total) by mouth 2 (two) times daily with a meal. 60 tablet 3   nicotine polacrilex (NICORETTE) 4 MG gum Chew 2 pieces (8 mg total) by mouth as needed for smoking cessation. 110 tablet 2   nitroGLYCERIN (NITRO-DUR) 0.2 mg/hr patch Cut patch into one - fourth pieces. Place a one-fourth (1/4) piece of patch on skin over affected area, changing to a new piece every 24 hours. 30 patch 1   QUEtiapine (SEROQUEL) 300 MG tablet Take 1 tablet (300 mg total) by mouth at bedtime. 30 tablet 1   Semaglutide, 2 MG/DOSE, (OZEMPIC, 2 MG/DOSE,) 8 MG/3ML SOPN Inject 2 mg into the skin once a week. 3 mL 3   No current facility-administered medications for this visit.     Musculoskeletal: Strength & Muscle Tone: Unable to assess due to telemedicine visit Gait & Station: Unable to assess due to telemedicine visit Patient leans: Unable to assess due to telemedicine visit  Psychiatric Specialty Exam: Review of Systems  Psychiatric/Behavioral:  Positive for dysphoric mood, hallucinations and sleep disturbance. Negative for decreased concentration, self-injury and suicidal ideas. The patient is nervous/anxious. The patient is not hyperactive.     There were no vitals taken for this visit.There is no height or weight on file to calculate BMI.  General Appearance: Casual  Eye Contact:  Good  Speech:   Clear and Coherent and Normal Rate  Volume:  Normal  Mood:  Anxious and Depressed  Affect:  Congruent  Thought Process:  Coherent, Goal Directed, and Descriptions of Associations: Intact  Orientation:  Full (Time, Place, and Person)  Thought Content: Hallucinations: Auditory Visual   Suicidal Thoughts:  No  Homicidal Thoughts:  No  Memory:  Immediate;   Good Recent;   Good Remote;   Good  Judgement:  Good  Insight:  Good  Psychomotor Activity:  Normal  Concentration:  Concentration: Good and Attention Span: Good  Recall:  Good  Fund of Knowledge: Good  Language: Good  Akathisia:  No  Handed:  Right  AIMS (if indicated): not done  Assets:  Communication Skills Desire for Improvement Financial Resources/Insurance Housing Social Support  ADL's:  Intact  Cognition: WNL  Sleep:  Poor   Screenings: GAD-7    Flowsheet Row Video Visit from 01/27/2024 in [facility] Video Visit from 12/16/2023 in [facility] Video Visit from 10/09/2023 in [facility] Video Visit from 07/07/2023 in [facility] Video Visit from 05/26/2023 in [facility]  Total GAD-7 Score 16 18 17 13 18       PHQ2-9    Flowsheet Row Video Visit from 01/27/2024 in [facility] Video Visit from 12/16/2023 in [facility] Video Visit from 10/09/2023 in [facility] Video Visit from 07/07/2023 in [facility] Counselor from 06/11/2023 in  [facility]  PHQ-2 Total Score 5 5 4 3 4   PHQ-9 Total Score 17 17 15 11 16       Flowsheet Row Video Visit from 01/27/2024 in [facility] Video Visit from 12/16/2023 in [facility] Video Visit from 10/09/2023 in [facility]  C-SSRS  RISK CATEGORY Moderate Risk Moderate Risk Moderate Risk        Assessment and Plan:   Tanith Dagostino is a 53 year old, African-American female with a past psychiatric history significant for bipolar 2 disorder (major depressive episode), generalized anxiety disorder, insomnia, and tobacco use disorder who presents to [facility] via virtual video visit for follow-up and medication management.  Patient presents to the encounter stating that she continues to experience auditory and visual hallucinations.  She reports that her auditory hallucinations are negative in nature and states that she experiences seeing shadows in the periphery of her vision.  Patient also endorses delusions on occasion.  In addition to her auditory/visual hallucinations and delusions, patient endorses depression and anxiety attributed to the anniversary of her mother's passing.  Due to her auditory/visual hallucinations and delusion, provider recommended patient increase her Seroquel from 200 mg to 300 mg at bedtime.  Patient was agreeable to recommendation.  Patient to continue taking all other medications as prescribed.  Patient's medications to be e-prescribed to pharmacy of choice.  Collaboration of Care: Collaboration of Care: Medication Management AEB provider managing patient's psychiatric medications, Primary Care Provider AEB patient being seen by primary care provider at [facility], Psychiatrist AEB patient being seen by a mental health provider at this facility, Other provider involved in patient's care AEB patient being seen by Podiatry, and Referral or follow-up with counselor/therapist AEB patient being seen by a licensed clinical social worker at this facility  Patient/Guardian was advised Release of Information must be obtained prior to any record release in order to collaborate their care with an outside provider. Patient/Guardian was  advised if they have not already done so to contact the registration department to sign all necessary forms in order for [place] to release information regarding their care.   Consent: Patient/Guardian gives verbal consent for treatment and assignment of benefits for services provided during this visit. Patient/Guardian expressed understanding and agreed to proceed.   1. Bipolar 2 disorder, major depressive episode (HCC)  - QUEtiapine (SEROQUEL) 300 MG tablet; Take 1 tablet (300 mg total) by mouth at bedtime.  Dispense: 30 tablet; Refill: 1 - gabapentin (NEURONTIN) 400 MG capsule; Take 1 capsule (400 mg total) by mouth 3 (three) times daily.  Dispense: 90 capsule; Refill: 1 - amitriptyline (ELAVIL) 25 MG tablet; Take 1 tablet (25 mg total) by mouth at bedtime.  Dispense: 30 tablet; Refill: 1  2. Generalized anxiety disorder  - gabapentin (NEURONTIN) 400 MG capsule; Take 1 capsule (400 mg total) by mouth 3 (three) times daily.  Dispense: 90 capsule; Refill: 1 - busPIRone (BUSPAR) 30 MG tablet; Take 1 tablet (30 mg total) by mouth 2 (two) times daily.  Dispense: 60 tablet; Refill: 1 - amitriptyline (ELAVIL) 25 MG tablet; Take 1 tablet (25 mg total) by mouth at bedtime.  Dispense: 30 tablet; Refill: 1  Patient to follow-up in 6 weeks Provider spent a total of 15 minutes with the patient/reviewing patient's chart  Meta Hatchet, PA 01/27/2024, 6:58 PM

## 2024-01-28 ENCOUNTER — Other Ambulatory Visit (HOSPITAL_COMMUNITY): Payer: Self-pay

## 2024-01-29 ENCOUNTER — Ambulatory Visit
Admission: RE | Admit: 2024-01-29 | Discharge: 2024-01-29 | Disposition: A | Discharge status: Home / Self Care | Payer: Medicare Other | Source: Ambulatory Visit | Attending: Internal Medicine | Admitting: Internal Medicine

## 2024-01-29 DIAGNOSIS — Z1231 Encounter for screening mammogram for malignant neoplasm of breast: Secondary | ICD-10-CM

## 2024-02-01 ENCOUNTER — Other Ambulatory Visit: Payer: Self-pay | Admitting: Internal Medicine

## 2024-02-01 ENCOUNTER — Other Ambulatory Visit: Payer: Self-pay

## 2024-02-01 DIAGNOSIS — Z794 Long term (current) use of insulin: Secondary | ICD-10-CM

## 2024-02-01 NOTE — Telephone Encounter (Unsigned)
 Copied from CRM [id number]. Topic: Clinical - Medication Refill >> Feb 01, 2024  3:34 PM Irine Seal wrote: Most Recent Primary Care Visit:  Provider: Champ Mungo  Department: [facility]  Visit Type: OPEN ESTABLISHED  Date: 11/02/2023  Medication: insulin aspart protamine - aspart (NOVOLOG MIX 70/25FLEXPEN)  Has the patient contacted their pharmacy? Yes Told her to contact PCP, per chart this medication says discontinued  Is this the correct pharmacy for this prescription? Yes If no, delete pharmacy and type the correct one.  This is the patient's preferred pharmacy:  [facility] [address] [place] [address] Phone: [phone] Fax: [phone]   Has the prescription been filled recently? No  Is the patient out of the medication? Yes 1 injection left will run out tomorrow   Has the patient been seen for an appointment in the last year OR does the patient have an upcoming appointment? Yes  Can we respond through MyChart? Yes  Agent: Please be advised that Rx refills may take up to 3 business days. We ask that you follow-up with your pharmacy.

## 2024-02-02 ENCOUNTER — Ambulatory Visit (INDEPENDENT_AMBULATORY_CARE_PROVIDER_SITE_OTHER)

## 2024-02-02 ENCOUNTER — Other Ambulatory Visit: Payer: Self-pay | Admitting: Internal Medicine

## 2024-02-02 ENCOUNTER — Other Ambulatory Visit: Payer: Self-pay

## 2024-02-02 ENCOUNTER — Other Ambulatory Visit (HOSPITAL_COMMUNITY): Payer: Self-pay

## 2024-02-02 ENCOUNTER — Ambulatory Visit (INDEPENDENT_AMBULATORY_CARE_PROVIDER_SITE_OTHER): Admitting: Podiatry

## 2024-02-02 DIAGNOSIS — Z794 Long term (current) use of insulin: Secondary | ICD-10-CM

## 2024-02-02 DIAGNOSIS — Q828 Other specified congenital malformations of skin: Secondary | ICD-10-CM | POA: Diagnosis not present

## 2024-02-02 DIAGNOSIS — M79671 Pain in right foot: Secondary | ICD-10-CM

## 2024-02-02 DIAGNOSIS — M19071 Primary osteoarthritis, right ankle and foot: Secondary | ICD-10-CM

## 2024-02-02 DIAGNOSIS — E1149 Type 2 diabetes mellitus with other diabetic neurological complication: Secondary | ICD-10-CM

## 2024-02-02 DIAGNOSIS — M7751 Other enthesopathy of right foot: Secondary | ICD-10-CM

## 2024-02-02 MED ORDER — INSULIN LISPRO PROT & LISPRO (75-25 MIX) 100 UNIT/ML KWIKPEN
30.0000 [IU] | PEN_INJECTOR | Freq: Two times a day (BID) | SUBCUTANEOUS | 2 refills | Status: DC
Start: 2024-02-02 — End: 2024-05-09
  Filled 2024-02-02: qty 18, 30d supply, fill #0
  Filled 2024-02-29: qty 18, 30d supply, fill #1
  Filled 2024-04-03: qty 18, 30d supply, fill #2

## 2024-02-02 MED ORDER — CELECOXIB 100 MG PO CAPS
100.0000 mg | ORAL_CAPSULE | Freq: Two times a day (BID) | ORAL | 1 refills | Status: DC
Start: 2024-02-02 — End: 2024-04-25
  Filled 2024-02-02: qty 30, 15d supply, fill #0
  Filled 2024-02-14: qty 30, 15d supply, fill #1

## 2024-02-02 NOTE — Telephone Encounter (Signed)
 Medication sent to pharmacy

## 2024-02-02 NOTE — Patient Instructions (Signed)

## 2024-02-03 ENCOUNTER — Encounter: Payer: Medicare Other | Admitting: Internal Medicine

## 2024-02-03 ENCOUNTER — Other Ambulatory Visit (HOSPITAL_COMMUNITY): Payer: Self-pay

## 2024-02-03 NOTE — Progress Notes (Signed)
 Subjective: Chief Complaint  Patient presents with   [facility]    RM#11 DFC ,right foot pain and swelling.    53 year old female presents the office today for concerns of painful calluses on both of her feet.  Still gets ongoing pain to the ankle which is chronic and occasional swelling.  She is complaining more discomfort of the right foot without any injuries that she reports.  She is asking about Celebrex as a friend told her about this.  No new injuries or concerns. No open lesions.   Last A1c was 7.5 on November 02, 2023.  Mercie Eon, MD Last seen: November 02, 2023  Objective: AAO x3, NAD DP/PT pulses palpable bilaterally, CRT less than 3 seconds Significant bunions are present bilateral with hammertoes deformities bilaterally.  Flatfoot is present bilaterally.   Hyperkeratotic lesion left submetatarsal 2 as well as fifth metatarsal head on the left foot.  Minimal callus on the medial aspect the left second toe.  There is no underlying ulceration drainage or any signs of infection.  There is no ulcerations. Mild diffuse discomfort of the ankle with mild chronic swelling.  No area of pinpoint tenderness.  She gets diffuse tenderness to the toes, foot but no specific area pinpoint tenderness.  Previous triple arthrodesis noted. No pain with calf compression, swelling, warmth, erythema  Assessment: Right ankle arthritis, flatfoot; hyperkeratotic lesions  Plan:  -X-rays were obtained reviewed of the right foot.  Previous triple arthrodesis noted.  Digital contractures present.  Arthritic changes present the midfoot.  No evidence of acute fracture.  Right foot pain, arthritis/flatfoot -Will switch from ibuprofen to Celebrex.  Discussed topical medications as well.  Continue shoes, good support.  Consider surgical intervention in the future if needed.  She could also be having some neuropathy symptoms to the point hold off on adjusting the gabapentin dose for now.  Preulcerative  calluses -Sharply debrided hyperkeratotic lesions x 3 without any complications or bleeding  Vivi Barrack DPM

## 2024-02-04 ENCOUNTER — Other Ambulatory Visit (HOSPITAL_COMMUNITY): Payer: Self-pay

## 2024-02-04 ENCOUNTER — Ambulatory Visit: Admitting: Student

## 2024-02-04 ENCOUNTER — Encounter: Admitting: Adult Health

## 2024-02-04 VITALS — BP 132/73 | HR 92 | Temp 97.9°F | Ht 75.0 in | Wt 273.5 lb

## 2024-02-04 DIAGNOSIS — Z794 Long term (current) use of insulin: Secondary | ICD-10-CM

## 2024-02-04 DIAGNOSIS — E1165 Type 2 diabetes mellitus with hyperglycemia: Secondary | ICD-10-CM

## 2024-02-04 DIAGNOSIS — Z3042 Encounter for surveillance of injectable contraceptive: Secondary | ICD-10-CM | POA: Diagnosis not present

## 2024-02-04 DIAGNOSIS — Z3202 Encounter for pregnancy test, result negative: Secondary | ICD-10-CM

## 2024-02-04 DIAGNOSIS — Z309 Encounter for contraceptive management, unspecified: Secondary | ICD-10-CM | POA: Diagnosis not present

## 2024-02-04 LAB — POCT GLYCOSYLATED HEMOGLOBIN (HGB A1C): Hemoglobin A1C: 6.4 % — AB (ref 4.0–5.6)

## 2024-02-04 LAB — GLUCOSE, CAPILLARY: Glucose-Capillary: 97 mg/dL (ref 70–99)

## 2024-02-04 LAB — POCT URINE PREGNANCY: Preg Test, Ur: NEGATIVE

## 2024-02-04 MED ORDER — MEDROXYPROGESTERONE ACETATE 104 MG/0.65ML ~~LOC~~ SUSY
104.0000 mg | PREFILLED_SYRINGE | Freq: Once | SUBCUTANEOUS | Status: AC
  Administered 2024-02-04: 104 mg via SUBCUTANEOUS

## 2024-02-04 NOTE — Progress Notes (Signed)
 Subjective:  CC: Depo, diabetes follow-up  HPI:  Tina [name] is a 53 y.o. person with a past medical history stated below and presents today for the stated chief complaint. Please see problem based assessment and plan for additional details.  Past Medical History:  Diagnosis Date   Diabetes mellitus    type II   Fibroids    HTN (hypertension)    Hyperlipidemia    Iron deficiency anemia    Left acetabular fracture (HCC)    Lumbar strain    Obesity    Ovarian cyst     Current Outpatient Medications on File Prior to Visit  Medication Sig Dispense Refill   amitriptyline (ELAVIL) 25 MG tablet Take 1 tablet (25 mg total) by mouth at bedtime. 30 tablet 1   amLODipine-olmesartan (AZOR) 5-20 MG tablet Take 1 tablet by mouth daily. 90 tablet 3   atorvastatin (LIPITOR) 40 MG tablet Take 1 tablet (40 mg total) by mouth daily. 100 tablet 2   blood glucose meter kit and supplies KIT Use up to 4 (four) times daily as directed 1 each 0   Blood Glucose Monitoring Suppl (CONTOUR NEXT ONE) KIT 1 each by Does not apply route See admin instructions. USE TO CHECK BLOOD GLUCOSE ONCE DAILY. IM PROGRAM. 1 kit 0   busPIRone (BUSPAR) 30 MG tablet Take 1 tablet (30 mg total) by mouth 2 (two) times daily. 60 tablet 1   celecoxib (CELEBREX) 100 MG capsule Take 1 capsule (100 mg total) by mouth 2 (two) times daily. 30 capsule 1   Continuous Glucose Sensor (FREESTYLE LIBRE 3 SENSOR) MISC Place 1 sensor on the skin every 14 days. Use to check glucose continuously 6 each 3   cyclobenzaprine (FLEXERIL) 5 MG tablet Take 1 tablet (5 mg total) by mouth at bedtime as needed for muscle spasms. 30 tablet 1   gabapentin (NEURONTIN) 400 MG capsule Take 1 capsule (400 mg total) by mouth 3 (three) times daily. 90 capsule 1   glucose blood (CONTOUR NEXT TEST) test strip Please use to check your sugar 3 times a day. 100 strip 3   glucose blood test strip Use up to 4 (four) times daily as directed 100  each 0   ibuprofen (ADVIL) 800 MG tablet Take 1 tablet (800 mg total) by mouth every 8 (eight) hours as needed. 30 tablet 0   Insulin Lispro Prot & Lispro (HUMALOG MIX 75/25 KWIKPEN) (75-25) 100 UNIT/ML Kwikpen Inject 30 Units into the skin 2 (two) times daily before a meal. 18 mL 2   Insulin Pen Needle 32G X 4 MM MISC Use new pen needle with each insulin injection two times daily 200 each 4   lamoTRIgine (LAMICTAL) 100 MG tablet Take 1 tablet (100 mg total) by mouth daily. 30 tablet 1   lamoTRIgine (LAMICTAL) 25 MG tablet Take 1 tablet (25 mg total) by mouth daily for 14 days, THEN 2 tablets (50 mg total) daily for 14 days. 42 tablet 0   Lancets (ONETOUCH DELICA PLUS LANCET33G) MISC Use up to 4 (four) times daily as directed 100 each 0   Lancets MISC 1 Units by Does not apply route 3 (three) times daily. 100 each 3   metFORMIN (GLUCOPHAGE) 1000 MG tablet Take 1 tablet (1,000 mg total) by mouth 2 (two) times daily with a meal. 60 tablet 3   nicotine polacrilex (NICORETTE) 4 MG gum Chew 2 pieces (8 mg total) by mouth as needed for smoking cessation. 110 tablet 2  nitroGLYCERIN (NITRO-DUR) 0.2 mg/hr patch Cut patch into one - fourth pieces. Place a one-fourth (1/4) piece of patch on skin over affected area, changing to a new piece every 24 hours. 30 patch 1   QUEtiapine (SEROQUEL) 300 MG tablet Take 1 tablet (300 mg total) by mouth at bedtime. 30 tablet 1   Semaglutide, 2 MG/DOSE, (OZEMPIC, 2 MG/DOSE,) 8 MG/3ML SOPN Inject 2 mg into the skin once a week. 3 mL 3   No current facility-administered medications on file prior to visit.    Review of Systems: Please see assessment and plan for pertinent positives and negatives.  Objective:   Vitals:   02/04/24 0930 02/04/24 1011  BP: (!) 150/65 132/73  Pulse: 97 92  Temp: 97.9 F (36.6 C)   TempSrc: Oral   SpO2: 100%   Weight: 273 lb 8 oz (124.1 kg)   Height: 6\' 3"  (1.905 m)     Physical Exam: Constitutional:  Well-appearing Cardiovascular: Regular rate and rhythm Pulmonary/Chest: lungs clear to auscultation bilaterally Abdominal: soft, non-tender, non-distended Extremities: No edema of the lower extremities bilaterally Psych: Very pleasant affect Thought process is linear and is goal-directed.     Assessment & Plan:  Type 2 diabetes mellitus (HCC) Current regimen includes Ozempic 2 mg weekly, metformin 1 g twice daily, mixed insulin 30 units twice daily.  Reviewed patient's glucometer today, minimal lows otherwise well-controlled.  A1c improved today to 6.4.  8 pound weight loss since last visit.  Congratulated patient on this, they are very pleased.  She does endorse a lot of constipation at times with states she has IBS and manages with milk of magnesia as well as Dulcolax.  States MiraLAX has not helped her much in the past.  Otherwise denies nausea, vomiting, chest pain, shortness of breath.   Plan: A1c today, well-controlled Continue with current regimen, instructed patient to decrease insulin to 28 units daily (previous dose) should she have worsening lows.   Encounter for contraceptive management On Depo, last shot about 3 months ago.  Currently sexually active, point-of-care pregnancy test negative.  No menstrual cycle since last visit. Plan: Depo injection today.  Follow-up 3 months.    Patient discussed with Dr.  Katherina Mires MD [facility]  PGY-1 Pager: [phone]  Phone: [phone] Date 02/04/2024  Time 12:56 PM

## 2024-02-04 NOTE — Patient Instructions (Addendum)
 Thank you, Tina [name] for allowing [place] to provide your care today.  I have ordered the following tests for you:  Lab Orders         Glucose, capillary         POC Hbg A1C         POCT Urine Pregnancy       Follow up: 3 months for diabetes and depo   We look forward to seeing you next time. Please call our clinic at [phone] if you have any questions or concerns. The best time to call is Monday-Friday from 9am-4pm, but there is someone available 24/7. If after hours or the weekend, call the main hospital number and ask for the Internal Medicine Resident On-Call. If you need medication refills, please notify your pharmacy one week in advance and they will send [place] a request.   Thank you for trusting me with your care. Wishing you the best!  Lovie Macadamia MD [facility]

## 2024-02-04 NOTE — Assessment & Plan Note (Signed)
 On Depo, last shot about 3 months ago.  Currently sexually active, point-of-care pregnancy test negative.  No menstrual cycle since last visit. Plan: Depo injection today.  Follow-up 3 months.

## 2024-02-04 NOTE — Assessment & Plan Note (Signed)
 Current regimen includes Ozempic 2 mg weekly, metformin 1 g twice daily, mixed insulin 30 units twice daily.  Reviewed patient's glucometer today, minimal lows otherwise well-controlled.  A1c improved today to 6.4.  8 pound weight loss since last visit.  Congratulated patient on this, they are very pleased.  She does endorse a lot of constipation at times with states she has IBS and manages with milk of magnesia as well as Dulcolax.  States MiraLAX has not helped her much in the past.  Otherwise denies nausea, vomiting, chest pain, shortness of breath.   Plan: A1c today, well-controlled Continue with current regimen, instructed patient to decrease insulin to 28 units daily (previous dose) should she have worsening lows.

## 2024-02-04 NOTE — Progress Notes (Signed)
 Internal Medicine Clinic Attending  Case discussed with the resident at the time of the visit.  We reviewed the resident's history and exam and pertinent patient test results.  I agree with the assessment, diagnosis, and plan of care documented in the resident's note.

## 2024-02-07 NOTE — Progress Notes (Signed)
 This encounter was created in error - please disregard.

## 2024-02-17 ENCOUNTER — Other Ambulatory Visit (HOSPITAL_COMMUNITY): Payer: Self-pay

## 2024-02-18 ENCOUNTER — Other Ambulatory Visit (HOSPITAL_COMMUNITY): Payer: Self-pay

## 2024-02-19 ENCOUNTER — Ambulatory Visit (HOSPITAL_COMMUNITY): Payer: Medicare Other | Admitting: Clinical

## 2024-02-25 ENCOUNTER — Ambulatory Visit (INDEPENDENT_AMBULATORY_CARE_PROVIDER_SITE_OTHER): Admitting: Clinical

## 2024-02-25 DIAGNOSIS — F3181 Bipolar II disorder: Secondary | ICD-10-CM | POA: Diagnosis not present

## 2024-02-27 NOTE — Progress Notes (Signed)
   THERAPIST PROGRESS NOTE Virtual Visit via Video Note  I connected with Tina [name] on 02/25/2024 at  3:00 PM EDT by a video enabled telemedicine application and verified that I am speaking with the correct person using two identifiers.  Location: Patient: home Provider: office   I discussed the limitations of evaluation and management by telemedicine and the availability of in person appointments. The patient expressed understanding and agreed to proceed.   Follow Up Instructions: I discussed the assessment and treatment plan with the patient. The patient was provided an opportunity to ask questions and all were answered. The patient agreed with the plan and demonstrated an understanding of the instructions.   The patient was advised to call back or seek an in-person evaluation if the symptoms worsen or if the condition fails to improve as anticipated.   Session Time: 16 minutes  Participation Level: Active  Behavioral Response: CasualAlertDepressed  Type of Therapy: Individual Therapy  Treatment Goals addressed:  Kara WILL IDENTIFY 3 COGNITIVE PATTERNS AND BELIEFS THAT SUPPORT DEPRESSION   ProgressTowards Goals: Progressing  Interventions: CBT and Supportive  Summary:  Tina [name] is a 53 y.o. female who presents for scheduled appointment oriented x 4, appropriately dressed, and friendly.  Client denied hallucinations and delusions. Client reported today she is doing fairly okay.  Client reported recently she and her family attended a funeral for her first cousin.  Client reported she is passing was very sudden although his health diagnosis was already known.  Client reported she saw her mother sister who looks just like her mother.  Client reported that it is very difficult for her to go to funerals because she has not been to them since her mother's.  Client reported she only lives with 3 people positively and not seeing them in the casket.  Client reported otherwise she has been managing okay at home. Evidence of progress towards goal:  client reported 1 positive of being able to reframe grief into a positive progression of her reflection of her mother.  Suicidal/Homicidal: Nowithout intent/plan  Therapist Response:  Therapist began the appointment asking the client how she has been doing. Therapist engaged with active listening and positive emotional support. Therapist used CBT to give the client time to discuss recent changes and triggers to her depression. Therapist used cbt to normalize her continuum of intermittent grief within reason. Therapist used CBT ask the client to identify her progress with frequency of use with coping skills with continued practice in her daily activity.    Therapist assigned the client homework to practice self care.    Plan: Return again in 4 weeks.  Diagnosis: bipolar 2, MDD  Collaboration of Care: Patient refused AEB none requested by the client.  Patient/Guardian was advised Release of Information must be obtained prior to any record release in order to collaborate their care with an outside provider. Patient/Guardian was advised if they have not already done so to contact the registration department to sign all necessary forms in order for us  to release information regarding their care.   Consent: Patient/Guardian gives verbal consent for treatment and assignment of benefits for services provided during this visit. Patient/Guardian expressed understanding and agreed to proceed.   Laddie Naeem Y Alejandra Hunt, LCSW 02/25/2024

## 2024-02-29 ENCOUNTER — Telehealth: Payer: Self-pay | Admitting: *Deleted

## 2024-02-29 ENCOUNTER — Other Ambulatory Visit (HOSPITAL_COMMUNITY): Payer: Self-pay

## 2024-02-29 ENCOUNTER — Other Ambulatory Visit: Payer: Self-pay | Admitting: Internal Medicine

## 2024-02-29 ENCOUNTER — Other Ambulatory Visit: Payer: Self-pay

## 2024-02-29 ENCOUNTER — Other Ambulatory Visit (HOSPITAL_COMMUNITY): Payer: Self-pay | Admitting: Physician Assistant

## 2024-02-29 DIAGNOSIS — E1165 Type 2 diabetes mellitus with hyperglycemia: Secondary | ICD-10-CM

## 2024-02-29 DIAGNOSIS — F3181 Bipolar II disorder: Secondary | ICD-10-CM

## 2024-02-29 MED ORDER — OZEMPIC (2 MG/DOSE) 8 MG/3ML ~~LOC~~ SOPN
2.0000 mg | PEN_INJECTOR | SUBCUTANEOUS | 3 refills | Status: DC
  Filled 2024-02-29: qty 3, 28d supply, fill #0
  Filled 2024-04-03: qty 3, 28d supply, fill #1
  Filled 2024-05-09: qty 3, 28d supply, fill #2
  Filled 2024-06-03: qty 3, 28d supply, fill #3

## 2024-02-29 NOTE — Telephone Encounter (Unsigned)
 Copied from CRM [id number]. Topic: Clinical - Prescription Issue >> Feb 29, 2024  2:23 PM Brynn Caras wrote: Reason for CRM: The patient called this afternoon in regards to prior authorization needed for her Semaglutide , 2 MG/DOSE, (OZEMPIC , 2 MG/DOSE,) 8 MG/3ML SOPN refill.  Callback [phone]

## 2024-02-29 NOTE — Telephone Encounter (Signed)
 Copied from CRM [id number]. Topic: Clinical - Prescription Issue >> Feb 29, 2024  2:23 PM Brynn Caras wrote: Reason for CRM: The patient called this afternoon in regards to prior authorization needed for her Semaglutide , 2 MG/DOSE, (OZEMPIC , 2 MG/DOSE,) 8 MG/3ML SOPN refill.  Callback [phone]

## 2024-02-29 NOTE — Telephone Encounter (Signed)
 Medication sent to pharmacy

## 2024-03-01 ENCOUNTER — Telehealth: Payer: Self-pay

## 2024-03-01 NOTE — Telephone Encounter (Signed)
 Prior Authorization has been submitted, please see note from 03/01/24.

## 2024-03-01 NOTE — Telephone Encounter (Signed)
 Prior Authorization for patient (Ozempic  (2 MG/DOSE) 8MG /3ML pen-injectors) came through on cover my meds was submitted with last office notes and labs awaiting approval or denial.  AVW:[id number]

## 2024-03-01 NOTE — Telephone Encounter (Signed)
 Teacher, early years/pre (Key: [id number]) Ozempic  (2 MG/DOSE) 8MG Flora Humphreys pen-injectors Form [employer] Medicare Part D Electronic Prior Authorization Form (2017 NCPDP) Created Sent to Plan Plan Response Submit Clinical Questions Determination Favorable Message from Plan Request Reference Number: [id number]. OZEMPIC  INJ 8MG /3ML is approved through 11/02/2024. Your patient may now fill this prescription and it will be covered.. Authorization Expiration Date: November 02, 2024.  Patient is aware

## 2024-03-02 ENCOUNTER — Other Ambulatory Visit: Payer: Self-pay

## 2024-03-02 ENCOUNTER — Other Ambulatory Visit (HOSPITAL_COMMUNITY): Payer: Self-pay

## 2024-03-02 NOTE — Telephone Encounter (Signed)
 See Artice Last E's encounter.

## 2024-03-04 ENCOUNTER — Other Ambulatory Visit (HOSPITAL_COMMUNITY): Payer: Self-pay

## 2024-03-09 ENCOUNTER — Telehealth (HOSPITAL_COMMUNITY): Admitting: Physician Assistant

## 2024-03-09 ENCOUNTER — Encounter (HOSPITAL_COMMUNITY): Payer: Self-pay | Admitting: Physician Assistant

## 2024-03-09 DIAGNOSIS — Z79899 Other long term (current) drug therapy: Secondary | ICD-10-CM | POA: Diagnosis not present

## 2024-03-09 DIAGNOSIS — F3181 Bipolar II disorder: Secondary | ICD-10-CM | POA: Diagnosis not present

## 2024-03-09 DIAGNOSIS — F411 Generalized anxiety disorder: Secondary | ICD-10-CM

## 2024-03-09 MED ORDER — LAMOTRIGINE 100 MG PO TABS
100.0000 mg | ORAL_TABLET | Freq: Every day | ORAL | 1 refills | Status: AC
Start: 2024-03-09 — End: ?
  Filled 2024-03-09 – 2024-04-03 (×2): qty 30, 30d supply, fill #0

## 2024-03-09 MED ORDER — QUETIAPINE FUMARATE 25 MG PO TABS
ORAL_TABLET | ORAL | 0 refills | Status: DC
  Filled 2024-03-09 – 2024-04-03 (×2): qty 49, 21d supply, fill #0

## 2024-03-09 MED ORDER — GABAPENTIN 400 MG PO CAPS
400.0000 mg | ORAL_CAPSULE | Freq: Three times a day (TID) | ORAL | 1 refills | Status: DC
Start: 2024-03-09 — End: 2024-04-07
  Filled 2024-03-09 – 2024-04-03 (×2): qty 90, 30d supply, fill #0

## 2024-03-09 MED ORDER — AMITRIPTYLINE HCL 25 MG PO TABS
25.0000 mg | ORAL_TABLET | Freq: Every day | ORAL | 1 refills | Status: DC
  Filled 2024-03-09 – 2024-04-03 (×2): qty 30, 30d supply, fill #0

## 2024-03-09 MED ORDER — BUSPIRONE HCL 30 MG PO TABS
30.0000 mg | ORAL_TABLET | Freq: Two times a day (BID) | ORAL | 1 refills | Status: DC
  Filled 2024-03-09 – 2024-04-03 (×2): qty 60, 30d supply, fill #0

## 2024-03-09 NOTE — Progress Notes (Signed)
 BH MD/PA/NP OP Progress Note  Virtual Visit via Video Note  I connected with Tina [name] on 03/09/24 at 11:30 AM EDT by a video enabled telemedicine application and verified that I am speaking with the correct person using two identifiers.  Location: Patient: Home Provider: Clinic   I discussed the limitations of evaluation and management by telemedicine and the availability of in person appointments. The patient expressed understanding and agreed to proceed.  Follow Up Instructions:   I discussed the assessment and treatment plan with the patient. The patient was provided an opportunity to ask questions and all were answered. The patient agreed with the plan and demonstrated an understanding of the instructions.   The patient was advised to call back or seek an in-person evaluation if the symptoms worsen or if the condition fails to improve as anticipated.  I provided 27 minutes of non-face-to-face time during this encounter.  Gates Kasal, PA   03/09/2024 8:49 PM Avery Gingras  MRN:  [id number]  Chief Complaint:  Chief Complaint  Patient presents with   Follow-up   Medication Management   HPI:   Tina [name] is a 53 year old, African-American female with a past psychiatric history significant for bipolar 2 disorder emphases major depressive episode), generalized anxiety disorder, insomnia, and tobacco use disorder who presents to [facility] via virtual video visit for follow-up and medication management.  Patient is currently being managed on the following psychiatric medications:  Seroquel  300 mg at bedtime Amitriptyline  25 mg at bedtime Gabapentin  400 mg 3 times daily Buspirone  30 mg 2 times daily Lamotrigine  100 mg daily  Patient reports issues with her Seroquel  dosage adjustments but states that she has not noticed a difference in her mood.  Patient denies experiencing any involuntary  movements from the use of her Seroquel .  She does report that she experiences sluggishness from the medication.  She also continues to endorse sleep issues.  Patient attributes her sleep issues to her restlessness.  Patient also endorses having mood swings.  She denies manic symptoms but states that she continues to experience auditory hallucinations characterized by hearing demonic voices.  Patient continues to endorse depression and rates her depression 8 out of 10 with 10 being most severe.  Patient reports that the severity of her depression is dependent on what is going on during the day.  Patient notes that she lives with her brother and states that her brother is a source of aggravation for the patient.  Patient endorses the following depressive symptoms: feelings of sadness, lack of motivation, self-isolation, decreased energy, decreased concentration, and irritability.  Patient denies feelings of guilt/worthlessness or hopelessness.  Patient continues to endorse anxiety and rates her anxiety and 9 out of 10.  She reports that her anxiety comes and goes and on occasion, she feels like she cannot breathe.  A PHQ-9 screen was performed with the patient scoring at 13.  A GAD-7 screen was also performed the patient scoring a 17.  Patient is alert and oriented x 4, calm, cooperative, and fully engaged in conversation during the encounter.  Patient endorses irritable mood.  Patient denies suicidal ideations.  She does endorse homicidal ideations towards her brother but denies having a plan or intent.  Patient denies active auditory or visual hallucinations at this time and does not appear to be responding to internal/external stimuli.  Patient endorses poor sleep and receives on average 2 to 3 hours of sleep per night.  Patient endorses  fair appetite needs on average 1-2 meals per day.  Patient denies alcohol consumption or illicit drug use.  Patient endorses tobacco use and smokes on average 1/2  pack/day.  Visit Diagnosis:    ICD-10-CM   1. Bipolar 2 disorder, major depressive episode (HCC)  F31.81 QUEtiapine  (SEROQUEL ) 25 MG tablet    lamoTRIgine  (LAMICTAL ) 100 MG tablet    amitriptyline  (ELAVIL ) 25 MG tablet    gabapentin  (NEURONTIN ) 400 MG capsule    2. Generalized anxiety disorder  F41.1 busPIRone  (BUSPAR ) 30 MG tablet    amitriptyline  (ELAVIL ) 25 MG tablet    gabapentin  (NEURONTIN ) 400 MG capsule      Past Psychiatric History:  Insomnia Tobacco use Anxiety Depression Biplor 2 disorder  Past Medical History:  Past Medical History:  Diagnosis Date   Diabetes mellitus    type II   Fibroids    HTN (hypertension)    Hyperlipidemia    Iron deficiency anemia    Left acetabular fracture (HCC)    Lumbar strain    Obesity    Ovarian cyst     Past Surgical History:  Procedure Laterality Date   FOOT ARTHRODESIS, MODIFIED MCBRIDE     right foot bunion surgery and ankle surgery 1980s   ORIF ACETABULAR FRACTURE     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    Family Psychiatric History:  Niece - Schizophrenia  Family History:  Family History  Problem Relation Age of Onset   Colon cancer Sister    Diabetes Mother    Diabetes Father    Diabetes Brother    Celiac disease Paternal Aunt     Social History:  Social History   Socioeconomic History   Marital status: Single    Spouse name: Not on file   Number of children: 0   Years of education: 12   Highest education level: Some college, no degree  Occupational History   Occupation: TEFL teacher: UNEMPLOYED    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, academic  Tobacco Use   Smoking status: Every Day    Current packs/day: 0.50    Average packs/day: 0.5 packs/day for 23.0 years (11.5 ttl pk-yrs)    Types: Cigarettes   Smokeless tobacco: Never   Tobacco comments:    1/2PPD  Vaping Use   Vaping status: Never Used  Substance and Sexual Activity   Alcohol use: Yes    Alcohol/week: 0.0 standard drinks of alcohol    Comment: occ    Drug use: No   Sexual activity: Yes    Partners: Female    Birth control/protection: Injection    Comment: Depo  Other Topics Concern   Not on file  Social History Narrative   Homosexual; in a monogamous relationship w/ homosexual woman.   She is disabled.  She last worked in 2018 at [employer].    Right handed   One story home   Caffeine: 1 soda daily, occas drinks coffee    Social Drivers of Corporate investment banker Strain: Low Risk  (02/03/2024)   Overall Financial Resource Strain (CARDIA)    Difficulty of Paying Living Expenses: Not very hard  Food Insecurity: Food Insecurity Present (02/03/2024)   Hunger Vital Sign    Worried About Running Out of Food in the Last Year: Often true    Ran Out of Food in the Last Year: Often true  Transportation Needs: No Transportation Needs (02/03/2024)   PRAPARE - Administrator, Civil Service (Medical): No  Lack of Transportation (Non-Medical): No  Physical Activity: Insufficiently Active (02/03/2024)   Exercise Vital Sign    Days of Exercise per Week: 1 day    Minutes of Exercise per Session: 10 min  Stress: Stress Concern Present (02/03/2024)   [employer] of Occupational Health - Occupational Stress Questionnaire    Feeling of Stress : Very much  Social Connections: Socially Isolated (02/03/2024)   Social Connection and Isolation Panel [NHANES]    Frequency of Communication with Friends and Family: Once a week    Frequency of Social Gatherings with Friends and Family: Once a week    Attends Religious Services: 1 to 4 times per year    Active Member of [employer] or Organizations: No    Attends Engineer, structural: Not on file    Marital Status: Never married    Allergies:  Allergies  Allergen Reactions   Codeine  Other (See Comments)    hallucinations   Sglt2 Inhibitors     Recurrent yeast infections on Jardiance , stopped 06/2022    Metabolic Disorder Labs: Lab Results  Component Value Date   HGBA1C 6.4 (A)  02/04/2024   MPG 200 05/25/2017   MPG 237 02/26/2016   No results found for: "PROLACTIN" Lab Results  Component Value Date   CHOL 151 08/12/2023   TRIG 149 08/12/2023   HDL 32 (L) 08/12/2023   CHOLHDL 4.7 (H) 08/12/2023   VLDL 50 (H) 08/21/2014   LDLCALC 93 08/12/2023   LDLCALC 97 03/11/2022   Lab Results  Component Value Date   TSH 1.074 06/27/2011    Therapeutic Level Labs: No results found for: "LITHIUM" No results found for: "VALPROATE" No results found for: "CBMZ"  Current Medications: Current Outpatient Medications  Medication Sig Dispense Refill   amitriptyline  (ELAVIL ) 25 MG tablet Take 1 tablet (25 mg total) by mouth at bedtime. 30 tablet 1   amLODipine -olmesartan  (AZOR ) 5-20 MG tablet Take 1 tablet by mouth daily. 90 tablet 3   atorvastatin  (LIPITOR) 40 MG tablet Take 1 tablet (40 mg total) by mouth daily. 100 tablet 2   blood glucose meter kit and supplies KIT Use up to 4 (four) times daily as directed 1 each 0   Blood Glucose Monitoring Suppl (CONTOUR NEXT ONE) KIT 1 each by Does not apply route See admin instructions. USE TO CHECK BLOOD GLUCOSE ONCE DAILY. IM PROGRAM. 1 kit 0   busPIRone  (BUSPAR ) 30 MG tablet Take 1 tablet (30 mg total) by mouth 2 (two) times daily. 60 tablet 1   celecoxib  (CELEBREX ) 100 MG capsule Take 1 capsule (100 mg total) by mouth 2 (two) times daily. 30 capsule 1   Continuous Glucose Sensor (FREESTYLE LIBRE 3 SENSOR) MISC Place 1 sensor on the skin every 14 days. Use to check glucose continuously 6 each 3   cyclobenzaprine  (FLEXERIL ) 5 MG tablet Take 1 tablet (5 mg total) by mouth at bedtime as needed for muscle spasms. 30 tablet 1   gabapentin  (NEURONTIN ) 400 MG capsule Take 1 capsule (400 mg total) by mouth 3 (three) times daily. 90 capsule 1   glucose blood (CONTOUR NEXT TEST) test strip Please use to check your sugar 3 times a day. 100 strip 3   glucose blood test strip Use up to 4 (four) times daily as directed 100 each 0   ibuprofen   (ADVIL ) 800 MG tablet Take 1 tablet (800 mg total) by mouth every 8 (eight) hours as needed. 30 tablet 0   Insulin  Lispro Prot & Lispro (  HUMALOG  MIX 75/25 KWIKPEN) (75-25) 100 UNIT/ML Kwikpen Inject 30 Units into the skin 2 (two) times daily before a meal. 18 mL 2   Insulin  Pen Needle 32G X 4 MM MISC Use new pen needle with each insulin  injection two times daily 200 each 4   lamoTRIgine  (LAMICTAL ) 100 MG tablet Take 1 tablet (100 mg total) by mouth daily. 30 tablet 1   lamoTRIgine  (LAMICTAL ) 25 MG tablet Take 1 tablet (25 mg total) by mouth daily for 14 days, THEN 2 tablets (50 mg total) daily for 14 days. 42 tablet 0   Lancets (ONETOUCH DELICA PLUS LANCET33G) MISC Use up to 4 (four) times daily as directed 100 each 0   Lancets MISC 1 Units by Does not apply route 3 (three) times daily. 100 each 3   metFORMIN  (GLUCOPHAGE ) 1000 MG tablet Take 1 tablet (1,000 mg total) by mouth 2 (two) times daily with a meal. 60 tablet 3   nicotine  polacrilex (NICORETTE ) 4 MG gum Chew 2 pieces (8 mg total) by mouth as needed for smoking cessation. 110 tablet 2   nitroGLYCERIN  (NITRO-DUR ) 0.2 mg/hr patch Cut patch into one - fourth pieces. Place a one-fourth (1/4) piece of patch on skin over affected area, changing to a new piece every 24 hours. 30 patch 1   QUEtiapine  (SEROQUEL ) 25 MG tablet Take 4 tablets (100 mg total) by mouth at bedtime for 7 days, THEN 2 tablets (50 mg total) at bedtime for 7 days, THEN 1 tablet (25 mg total) at bedtime for 7 days. 49 tablet 0   Semaglutide , 2 MG/DOSE, (OZEMPIC , 2 MG/DOSE,) 8 MG/3ML SOPN Inject 2 mg into the skin once a week. 3 mL 3   No current facility-administered medications for this visit.     Musculoskeletal: Strength & Muscle Tone: Unable to assess due to telemedicine visit Gait & Station: Unable to assess due to telemedicine visit Patient leans: Unable to assess due to telemedicine visit  Psychiatric Specialty Exam: Review of Systems  Psychiatric/Behavioral:   Positive for dysphoric mood, hallucinations and sleep disturbance. Negative for decreased concentration, self-injury and suicidal ideas. The patient is nervous/anxious. The patient is not hyperactive.     There were no vitals taken for this visit.There is no height or weight on file to calculate BMI.  General Appearance: Casual  Eye Contact:  Good  Speech:  Clear and Coherent and Normal Rate  Volume:  Normal  Mood:  Anxious and Depressed  Affect:  Congruent  Thought Process:  Coherent, Goal Directed, and Descriptions of Associations: Intact  Orientation:  Full (Time, Place, and Person)  Thought Content: Hallucinations: Auditory   Suicidal Thoughts:  No  Homicidal Thoughts:  No  Memory:  Immediate;   Good Recent;   Good Remote;   Good  Judgement:  Good  Insight:  Good  Psychomotor Activity:  Normal  Concentration:  Concentration: Good and Attention Span: Good  Recall:  Good  Fund of Knowledge: Good  Language: Good  Akathisia:  No  Handed:  Right  AIMS (if indicated): done; 4  Assets:  Communication Skills Desire for Improvement Financial Resources/Insurance Housing Social Support  ADL's:  Intact  Cognition: WNL  Sleep:  Poor   Screenings: AIMS    Flowsheet Row Video Visit from 03/09/2024 in [facility]  AIMS Total Score 4      GAD-7    Flowsheet Row Video Visit from 03/09/2024 in [facility] Video Visit from 01/27/2024 in [facility] Video Visit from 12/16/2023 in [facility] Video Visit from 10/09/2023 in [facility] Video Visit from 07/07/2023 in [facility]  Total GAD-7 Score 17 16 18 17 13       PHQ2-9    Flowsheet Row Video Visit from 03/09/2024 in [facility] Office Visit from 02/04/2024 in [facility] [place] Video Visit  from 01/27/2024 in [facility] Video Visit from 12/16/2023 in [facility] Video Visit from 10/09/2023 in [facility]  PHQ-2 Total Score 4 0 5 5 4   PHQ-9 Total Score 13 -- 17 17 15       Flowsheet Row Video Visit from 03/09/2024 in [facility] Video Visit from 01/27/2024 in [facility] Video Visit from 12/16/2023 in [facility]  C-SSRS RISK CATEGORY Moderate Risk Moderate Risk Moderate Risk        Assessment and Plan:   Tina [name] is a 53 year old, African-American female with a past psychiatric history significant for bipolar 2 disorder (major depressive episode), generalized anxiety disorder, insomnia, and tobacco use disorder who presents to [facility] via virtual video visit for follow-up and medication management.  Patient presents to the encounter stating that she has been taking her medications regularly.  Since the adjustment of her Seroquel , patient reports that she has not noticed any difference in her mood.  Though patient denies experiencing any adverse side effects from the use of her Seroquel , an aims assessment was performed with the patient scoring a 4.  She reports that she experiences severe movements in her lower extremities.  Provider is unsure if patient is mistaking involuntary movements for pain.  Patient denies the need for medication management of these movements.  Provider to continue assessing patient's involuntary movements during subsequent encounters.  Since using her recently adjusted Seroquel , patient reports that she has not experienced any changes in her mood.  Patient also continues to endorse sluggishness she attributes to the use of her medication.  Patient denies manic symptoms but does endorse overt depressive symptoms and elevated anxiety.   Patient also endorses sleep issues and receives on average 2 to 3 hours of sleep per night.  A PHQ-9 screen was performed with the patient scoring at 13.  A GAD-7 screen was also performed the patient scoring 17.  Provider recommended patient discontinue her use of Seroquel .  Patient was instructed to take Seroquel  150 mg at bedtime for 7 days, 100 mg at bedtime for 7 days, followed by 50 mg at bedtime for 7 days, and 25 mg at bedtime for 7 days prior to discontinuing the medication.  Provider recommended patient be placed on Caplyta 42 mg daily for mood stability and the management of her psychotic symptoms (auditory hallucinations).  Provider informed patient that she would be provided with samples of Caplyta (21-day supply).  Patient was agreeable to recommendation.  Patient to continue taking all other medications as prescribed.  Due to her use of Seroquel , provider to obtain the following labs from the patient: Lipid profile, complete blood count with differential, and comprehensive metabolic panel.  Patient vocalized understanding.  Provider to also obtain an up-to-date EKG from the patient.  A columbia Suicide Severity Rating Scale was performed with the patient being considered moderate risk.  Patient denies  suicidal ideations and is able to contract for safety following the conclusion of the encounter.  Collaboration of Care: Collaboration of Care: Medication Management AEB provider managing patient's psychiatric medications, Primary Care Provider AEB patient being seen by primary care provider at [facility], Psychiatrist AEB patient being seen by a mental health provider at this facility, Other provider involved in patient's care AEB patient being seen by Podiatry, and Referral or follow-up with counselor/therapist AEB patient being seen by a licensed clinical social worker at this facility  Patient/Guardian was advised Release of Information must be obtained prior to any  record release in order to collaborate their care with an outside provider. Patient/Guardian was advised if they have not already done so to contact the registration department to sign all necessary forms in order for us  to release information regarding their care.   Consent: Patient/Guardian gives verbal consent for treatment and assignment of benefits for services provided during this visit. Patient/Guardian expressed understanding and agreed to proceed.   1. Bipolar 2 disorder, major depressive episode (HCC)  - QUEtiapine  (SEROQUEL ) 25 MG tablet; Take 4 tablets (100 mg total) by mouth at bedtime for 7 days, THEN 2 tablets (50 mg total) at bedtime for 7 days, THEN 1 tablet (25 mg total) at bedtime for 7 days.  Dispense: 49 tablet; Refill: 0 - lamoTRIgine  (LAMICTAL ) 100 MG tablet; Take 1 tablet (100 mg total) by mouth daily.  Dispense: 30 tablet; Refill: 1 - amitriptyline  (ELAVIL ) 25 MG tablet; Take 1 tablet (25 mg total) by mouth at bedtime.  Dispense: 30 tablet; Refill: 1 - gabapentin  (NEURONTIN ) 400 MG capsule; Take 1 capsule (400 mg total) by mouth 3 (three) times daily.  Dispense: 90 capsule; Refill: 1 - lumateperone tosylate (CAPLYTA) 42 MG capsule; Take 1 capsule (42 mg total) by mouth daily.  Dispense: 21 capsule; Refill: 0  2. Generalized anxiety disorder  - busPIRone  (BUSPAR ) 30 MG tablet; Take 1 tablet (30 mg total) by mouth 2 (two) times daily.  Dispense: 60 tablet; Refill: 1 - amitriptyline  (ELAVIL ) 25 MG tablet; Take 1 tablet (25 mg total) by mouth at bedtime.  Dispense: 30 tablet; Refill: 1 - gabapentin  (NEURONTIN ) 400 MG capsule; Take 1 capsule (400 mg total) by mouth 3 (three) times daily.  Dispense: 90 capsule; Refill: 1  3. Long term current use of antipsychotic medication (Primary) Referral to Cardiology for EKG  - Lipid panel; Future - CBC with Differential/Platelet; Future - Comprehensive Metabolic Panel (CMET); Future - Ambulatory referral to Cardiology  Patient to  follow-up in 4 weeks Provider spent a total of 23 minutes with the patient/reviewing patient's chart  Gates Kasal, PA 03/09/2024, 8:49 PM

## 2024-03-10 ENCOUNTER — Other Ambulatory Visit (HOSPITAL_COMMUNITY): Payer: Self-pay

## 2024-03-10 ENCOUNTER — Other Ambulatory Visit: Payer: Self-pay

## 2024-03-12 DIAGNOSIS — Z79899 Other long term (current) drug therapy: Secondary | ICD-10-CM | POA: Insufficient documentation

## 2024-03-12 MED ORDER — LUMATEPERONE TOSYLATE 42 MG PO CAPS: 42.0000 mg | ORAL_CAPSULE | Freq: Every day | ORAL | 0 refills | Status: DC

## 2024-03-14 ENCOUNTER — Other Ambulatory Visit (HOSPITAL_COMMUNITY): Payer: Self-pay

## 2024-03-18 ENCOUNTER — Ambulatory Visit (HOSPITAL_COMMUNITY): Admitting: Clinical

## 2024-03-18 DIAGNOSIS — F3181 Bipolar II disorder: Secondary | ICD-10-CM

## 2024-03-18 NOTE — Progress Notes (Signed)
 THERAPIST PROGRESS NOTE Virtual Visit via Video Note  I connected with Tina [name] on 03/18/2024 at  9:00 AM EDT by a video enabled telemedicine application and verified that I am speaking with the correct person using two identifiers.  Location: Patient: home Provider: office   I discussed the limitations of evaluation and management by telemedicine and the availability of in person appointments. The patient expressed understanding and agreed to proceed.   Follow Up Instructions: I discussed the assessment and treatment plan with the patient. The patient was provided an opportunity to ask questions and all were answered. The patient agreed with the plan and demonstrated an understanding of the instructions.   The patient was advised to call back or seek an in-person evaluation if the symptoms worsen or if the condition fails to improve as anticipated.   Session Time: 40 minutes  Participation Level: Active  Behavioral Response: CasualAlertIrritable  Type of Therapy: Individual Therapy  Treatment Goals addressed: Tina [name] WILL IDENTIFY 3 COGNITIVE PATTERNS AND BELIEFS THAT SUPPORT DEPRESSION   ProgressTowards Goals: Progressing  Interventions: CBT and Supportive  Summary:  Tina [name] is a 53 y.o. female who presents for the scheduled appointment oriented x 5, appropriately dressed, and friendly.  Client denied hallucinations and delusions. Client reported all today things have been about the same but she has been dealing with some depression, irritability and anxiety.  Client reported she continues to have a lot of issues with the brother that she lives with.  Client reported recently her brother who lives outside of the home informed her that he may be provoking another argument soon due to an increase in mortgage payment.  Client reported she is always on edge because she never knows what kind of attitude she will walking on with her brother.   Client reported that their arguments are verbal but they can get very bad.  Client reported she does not see herself being able to going to continue to live with him and she has had a friend who is offered her long time ago for her to come today.  Client reported the brother that she lives with has issues with others in the family and his attitude projects as it does towards everyone else in and outside the family. Evidence of progress towards goal:  client reported 1 positive of having coping mechanisms of physical boundaries and other family/ social support to help with her conflict at home.  Suicidal/Homicidal: Nowithout intent/plan  Therapist Response:  Therapist began the appointment asking the client how she has been doing since she was last seen.   Therapist engaged with active listening and positive emotional support. Therapist used CBT to ask the client to elaborate on his moods and feelings about stressors involving family that have caused her anxiety and depression. Therapist used CBT to normalize clients thoughts and emotions within reason. Therapist used CBT to continue teaching the client about appropriate boundaries and assertiveness skills that she needs to utilize as well as safety precautions for dealing with confrontational situations. Therapist used CBT ask the client to identify her progress with frequency of use with coping skills with continued practice in her daily activity.      Plan: Return again in 4 weeks.  Diagnosis: Bipolar 2, major depressive disorder  Collaboration of Care: Patient refused AEB none.  Patient/Guardian was advised Release of Information must be obtained prior to any record release in order to collaborate their care with an outside provider. Patient/Guardian was advised if  they have not already done so to contact the registration department to sign all necessary forms in order for us  to release information regarding their care.   Consent:  Patient/Guardian gives verbal consent for treatment and assignment of benefits for services provided during this visit. Patient/Guardian expressed understanding and agreed to proceed.   [name], LCSW 03/18/2024

## 2024-03-21 ENCOUNTER — Other Ambulatory Visit (HOSPITAL_COMMUNITY): Payer: Self-pay

## 2024-04-01 ENCOUNTER — Telehealth: Payer: Self-pay | Admitting: *Deleted

## 2024-04-01 ENCOUNTER — Other Ambulatory Visit (HOSPITAL_COMMUNITY): Payer: Self-pay

## 2024-04-01 MED ORDER — FREESTYLE LIBRE 3 PLUS SENSOR MISC
1 refills | Status: DC
  Filled 2024-04-01: qty 1, 30d supply, fill #0
  Filled 2024-04-01: qty 1, 15d supply, fill #0
  Filled 2024-04-04: qty 2, 30d supply, fill #0
  Filled ????-??-??: fill #0

## 2024-04-01 NOTE — Telephone Encounter (Signed)
 Copied from CRM [id number]. Topic: Clinical - Medical Advice >> Apr 01, 2024  8:10 AM Karole Pacer C wrote: Reason for CRM: patient states she only has the [employer] 3 but recv'd a text indicating she would need to upgrade to [employer] 3 plus  Patient would like to know if the provider would be sending in a script for her to recv' the  [employer] 3 plus .

## 2024-04-04 ENCOUNTER — Other Ambulatory Visit: Payer: Self-pay

## 2024-04-04 ENCOUNTER — Other Ambulatory Visit (HOSPITAL_COMMUNITY): Payer: Self-pay

## 2024-04-04 NOTE — Telephone Encounter (Signed)
 That is correct. All those using a Freestyle Libre 2 will have to change to [employer] 2 plus and those using [employer] 3 will have to change to the [employer] 3 plus by September 2025.

## 2024-04-06 ENCOUNTER — Telehealth (INDEPENDENT_AMBULATORY_CARE_PROVIDER_SITE_OTHER): Admitting: Physician Assistant

## 2024-04-06 DIAGNOSIS — F5105 Insomnia due to other mental disorder: Secondary | ICD-10-CM

## 2024-04-06 DIAGNOSIS — F3181 Bipolar II disorder: Secondary | ICD-10-CM | POA: Diagnosis not present

## 2024-04-06 DIAGNOSIS — F99 Mental disorder, not otherwise specified: Secondary | ICD-10-CM

## 2024-04-06 DIAGNOSIS — Z79899 Other long term (current) drug therapy: Secondary | ICD-10-CM

## 2024-04-06 DIAGNOSIS — F411 Generalized anxiety disorder: Secondary | ICD-10-CM

## 2024-04-07 ENCOUNTER — Other Ambulatory Visit (HOSPITAL_COMMUNITY): Payer: Self-pay

## 2024-04-07 ENCOUNTER — Telehealth (HOSPITAL_COMMUNITY): Payer: Self-pay

## 2024-04-07 ENCOUNTER — Encounter (HOSPITAL_COMMUNITY): Payer: Self-pay | Admitting: Physician Assistant

## 2024-04-07 MED ORDER — GABAPENTIN 400 MG PO CAPS
400.0000 mg | ORAL_CAPSULE | Freq: Three times a day (TID) | ORAL | 1 refills | Status: DC
  Filled 2024-04-07: qty 90, 30d supply, fill #0
  Filled 2024-05-09: qty 90, 30d supply, fill #1

## 2024-04-07 MED ORDER — MIRTAZAPINE 7.5 MG PO TABS
7.5000 mg | ORAL_TABLET | Freq: Every day | ORAL | 1 refills | Status: DC
Start: 2024-04-07 — End: 2024-05-25
  Filled 2024-04-07: qty 30, 30d supply, fill #0
  Filled 2024-05-09: qty 30, 30d supply, fill #1

## 2024-04-07 MED ORDER — LUMATEPERONE TOSYLATE 42 MG PO CAPS: 42.0000 mg | ORAL_CAPSULE | Freq: Every day | ORAL | 0 refills | Status: DC

## 2024-04-07 MED ORDER — LAMOTRIGINE 100 MG PO TABS
100.0000 mg | ORAL_TABLET | Freq: Every day | ORAL | 1 refills | Status: DC
  Filled 2024-04-07: qty 30, 30d supply, fill #0
  Filled 2024-05-09: qty 30, 30d supply, fill #1

## 2024-04-07 MED ORDER — BUSPIRONE HCL 30 MG PO TABS
30.0000 mg | ORAL_TABLET | Freq: Two times a day (BID) | ORAL | 1 refills | Status: DC
  Filled 2024-04-07: qty 60, 30d supply, fill #0
  Filled 2024-05-09: qty 60, 30d supply, fill #1

## 2024-04-07 NOTE — Telephone Encounter (Signed)
 Documented in sep call.   JNL

## 2024-04-07 NOTE — Progress Notes (Signed)
 BH MD/PA/NP OP Progress Note  Virtual Visit via Video Note  I connected with Tina [name] on 04/06/24 at 10:00 AM EDT by a video enabled telemedicine application and verified that I am speaking with the correct person using two identifiers.  Location: Patient: Home Provider: Clinic   I discussed the limitations of evaluation and management by telemedicine and the availability of in person appointments. The patient expressed understanding and agreed to proceed.  Follow Up Instructions:   I discussed the assessment and treatment plan with the patient. The patient was provided an opportunity to ask questions and all were answered. The patient agreed with the plan and demonstrated an understanding of the instructions.   The patient was advised to call back or seek an in-person evaluation if the symptoms worsen or if the condition fails to improve as anticipated.  I provided 29 minutes of non-face-to-face time during this encounter.  Gates Kasal, PA   04/06/2024 10:00 AM Tina [name]  MRN:  [id number]  Chief Complaint:  Chief Complaint  Patient presents with   Follow-up   Medication Management   HPI:   Tina [name] is a 53 year old, African-American female with a past psychiatric history significant for bipolar 2 disorder emphases major depressive episode), generalized anxiety disorder, insomnia, and tobacco use disorder who presents to [facility] via virtual video visit for follow-up and medication management.  Patient is currently being managed on the following psychiatric medications:  Caplyta  42 mg daily (Samples) Amitriptyline  25 mg at bedtime Gabapentin  400 mg 3 times daily Buspirone  30 mg 2 times daily Lamotrigine  100 mg daily  Patient presents to the encounter stating that she forgot to pick up her samples of Caplyta .  Though she forgot to pick up her samples of Caplyta , patient reports  that she was able to successfully discontinue her Seroquel .  Since discontinuing her Seroquel , patient describes her mood as good some days and bad on others.  Patient apparently attributes her mood to her ongoing leg and ankle pain that she has been experiencing.  Patient endorses depression and rates her depression as 7-8 out of 10 with 10 being most severe.  Patient endorses depressive episodes 3 days out of the week.  Patient endorses the following depressive symptoms: feelings of sadness, lack of motivation, irritability, and decreased energy.  Patient denies decreased concentration, feelings of guilt/worthlessness, or hopelessness.  Patient also endorses anxiety and rates her anxiety in 8 out of 10.  Contributing factors to her anxiety include being easily upset and the pain she is experiencing from her leg and ankle.  A PHQ-9 screen was performed with the patient scoring a 15.  A GAD-7 screen was also performed with the patient scoring a 16.  Patient reports that she also has been experiencing auditory hallucinations and states that she last experienced auditory hallucinations yesterday.  She reports that her hallucinations were command type and nature and that they were telling her to do stuff to others as well as to herself.  She endorses slight paranoia characterized by feeling that someone will do something to her.  Patient is alert and oriented x 4, calm, cooperative, and fully engaged in conversation during the encounter.  Patient reports that her mood is all right but states that she is currently in pain.  Patient exhibits depressed mood with congruent affect.  Patient denies suicidal or homicidal ideations.  She further denies auditory or visual hallucinations and does not appear to be responding to  internal/external stimuli.  Patient endorses poor sleep and receives on average 2 to 3 hours of sleep per night.  Patient endorses decreased appetite.  Patient denies alcohol consumption or illicit  drug use.  Patient endorses tobacco use and smokes on average a half a pack per day.  Visit Diagnosis:    ICD-10-CM   1. Insomnia due to other mental disorder  F51.05    F99     2. Generalized anxiety disorder  F41.1 busPIRone  (BUSPAR ) 30 MG tablet    gabapentin  (NEURONTIN ) 400 MG capsule    mirtazapine  (REMERON ) 7.5 MG tablet    3. Bipolar 2 disorder, major depressive episode (HCC)  F31.81 lamoTRIgine  (LAMICTAL ) 100 MG tablet    gabapentin  (NEURONTIN ) 400 MG capsule    lumateperone  tosylate (CAPLYTA ) 42 MG capsule    4. Long term current use of antipsychotic medication  Z79.899 Ambulatory referral to Cardiology      Past Psychiatric History:  Insomnia Tobacco use Anxiety Depression Biplor 2 disorder  Past Medical History:  Past Medical History:  Diagnosis Date   Diabetes mellitus    type II   Fibroids    HTN (hypertension)    Hyperlipidemia    Iron deficiency anemia    Left acetabular fracture (HCC)    Lumbar strain    Obesity    Ovarian cyst     Past Surgical History:  Procedure Laterality Date   FOOT ARTHRODESIS, MODIFIED MCBRIDE     right foot bunion surgery and ankle surgery 1980s   ORIF ACETABULAR FRACTURE     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    Family Psychiatric History:  Niece - Schizophrenia  Family History:  Family History  Problem Relation Age of Onset   Colon cancer Sister    Diabetes Mother    Diabetes Father    Diabetes Brother    Celiac disease Paternal Aunt     Social History:  Social History   Socioeconomic History   Marital status: Single    Spouse name: Not on file   Number of children: 0   Years of education: 12   Highest education level: Some college, no degree  Occupational History   Occupation: TEFL teacher: UNEMPLOYED    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, academic  Tobacco Use   Smoking status: Every Day    Current packs/day: 0.50    Average packs/day: 0.5 packs/day for 23.0 years (11.5 ttl pk-yrs)    Types: Cigarettes    Smokeless tobacco: Never   Tobacco comments:    1/2PPD  Vaping Use   Vaping status: Never Used  Substance and Sexual Activity   Alcohol use: Yes    Alcohol/week: 0.0 standard drinks of alcohol    Comment: occ   Drug use: No   Sexual activity: Yes    Partners: Female    Birth control/protection: Injection    Comment: Depo  Other Topics Concern   Not on file  Social History Narrative   Homosexual; in a monogamous relationship w/ homosexual woman.   She is disabled.  She last worked in 2018 at [employer].    Right handed   One story home   Caffeine: 1 soda daily, occas drinks coffee    Social Drivers of Health   Financial Resource Strain: Low Risk  (02/03/2024)   Overall Financial Resource Strain (CARDIA)    Difficulty of Paying Living Expenses: Not very hard  Food Insecurity: Food Insecurity Present (02/03/2024)   Hunger Vital Sign    Worried About  Running Out of Food in the Last Year: Often true    Ran Out of Food in the Last Year: Often true  Transportation Needs: No Transportation Needs (02/03/2024)   PRAPARE - Administrator, Civil Service (Medical): No    Lack of Transportation (Non-Medical): No  Physical Activity: Insufficiently Active (02/03/2024)   Exercise Vital Sign    Days of Exercise per Week: 1 day    Minutes of Exercise per Session: 10 min  Stress: Stress Concern Present (02/03/2024)   [employer] of Occupational Health - Occupational Stress Questionnaire    Feeling of Stress : Very much  Social Connections: Socially Isolated (02/03/2024)   Social Connection and Isolation Panel [NHANES]    Frequency of Communication with Friends and Family: Once a week    Frequency of Social Gatherings with Friends and Family: Once a week    Attends Religious Services: 1 to 4 times per year    Active Member of [employer] or Organizations: No    Attends Engineer, structural: Not on file    Marital Status: Never married    Allergies:  Allergies  Allergen Reactions    Codeine  Other (See Comments)    hallucinations   Sglt2 Inhibitors     Recurrent yeast infections on Jardiance , stopped 06/2022    Metabolic Disorder Labs: Lab Results  Component Value Date   HGBA1C 6.4 (A) 02/04/2024   MPG 200 05/25/2017   MPG 237 02/26/2016   No results found for: "PROLACTIN" Lab Results  Component Value Date   CHOL 151 08/12/2023   TRIG 149 08/12/2023   HDL 32 (L) 08/12/2023   CHOLHDL 4.7 (H) 08/12/2023   VLDL 50 (H) 08/21/2014   LDLCALC 93 08/12/2023   LDLCALC 97 03/11/2022   Lab Results  Component Value Date   TSH 1.074 06/27/2011    Therapeutic Level Labs: No results found for: "LITHIUM" No results found for: "VALPROATE" No results found for: "CBMZ"  Current Medications: Current Outpatient Medications  Medication Sig Dispense Refill   mirtazapine  (REMERON ) 7.5 MG tablet Take 1 tablet (7.5 mg total) by mouth at bedtime. 30 tablet 1   amLODipine -olmesartan  (AZOR ) 5-20 MG tablet Take 1 tablet by mouth daily. 90 tablet 3   atorvastatin  (LIPITOR) 40 MG tablet Take 1 tablet (40 mg total) by mouth daily. 100 tablet 2   blood glucose meter kit and supplies KIT Use up to 4 (four) times daily as directed 1 each 0   Blood Glucose Monitoring Suppl (CONTOUR NEXT ONE) KIT 1 each by Does not apply route See admin instructions. USE TO CHECK BLOOD GLUCOSE ONCE DAILY. IM PROGRAM. 1 kit 0   busPIRone  (BUSPAR ) 30 MG tablet Take 1 tablet (30 mg total) by mouth 2 (two) times daily. 60 tablet 1   celecoxib  (CELEBREX ) 100 MG capsule Take 1 capsule (100 mg total) by mouth 2 (two) times daily. 30 capsule 1   Continuous Glucose Sensor (FREESTYLE LIBRE 3 PLUS SENSOR) MISC Change sensor every 15 days. 1 each 1   Continuous Glucose Sensor (FREESTYLE LIBRE 3 SENSOR) MISC Place 1 sensor on the skin every 14 days. Use to check glucose continuously 6 each 3   cyclobenzaprine  (FLEXERIL ) 5 MG tablet Take 1 tablet (5 mg total) by mouth at bedtime as needed for muscle spasms. 30  tablet 1   gabapentin  (NEURONTIN ) 400 MG capsule Take 1 capsule (400 mg total) by mouth 3 (three) times daily. 90 capsule 1   glucose blood (CONTOUR NEXT  TEST) test strip Please use to check your sugar 3 times a day. 100 strip 3   glucose blood test strip Use up to 4 (four) times daily as directed 100 each 0   ibuprofen  (ADVIL ) 800 MG tablet Take 1 tablet (800 mg total) by mouth every 8 (eight) hours as needed. 30 tablet 0   Insulin  Lispro Prot & Lispro (HUMALOG  MIX 75/25 KWIKPEN) (75-25) 100 UNIT/ML Kwikpen Inject 30 Units into the skin 2 (two) times daily before a meal. 18 mL 2   Insulin  Pen Needle 32G X 4 MM MISC Use new pen needle with each insulin  injection two times daily 200 each 4   lamoTRIgine  (LAMICTAL ) 100 MG tablet Take 1 tablet (100 mg total) by mouth daily. 30 tablet 1   Lancets (ONETOUCH DELICA PLUS LANCET33G) MISC Use up to 4 (four) times daily as directed 100 each 0   Lancets MISC 1 Units by Does not apply route 3 (three) times daily. 100 each 3   lumateperone  tosylate (CAPLYTA ) 42 MG capsule Take 1 capsule (42 mg total) by mouth daily. 21 capsule 0   metFORMIN  (GLUCOPHAGE ) 1000 MG tablet Take 1 tablet (1,000 mg total) by mouth 2 (two) times daily with a meal. 60 tablet 3   nicotine  polacrilex (NICORETTE ) 4 MG gum Chew 2 pieces (8 mg total) by mouth as needed for smoking cessation. 110 tablet 2   nitroGLYCERIN  (NITRO-DUR ) 0.2 mg/hr patch Cut patch into one - fourth pieces. Place a one-fourth (1/4) piece of patch on skin over affected area, changing to a new piece every 24 hours. 30 patch 1   QUEtiapine  (SEROQUEL ) 25 MG tablet Take 4 tablets (100 mg total) by mouth at bedtime for 7 days, THEN 2 tablets (50 mg total) at bedtime for 7 days, THEN 1 tablet (25 mg total) at bedtime for 7 days. 49 tablet 0   Semaglutide , 2 MG/DOSE, (OZEMPIC , 2 MG/DOSE,) 8 MG/3ML SOPN Inject 2 mg into the skin once a week. 3 mL 3   No current facility-administered medications for this visit.      Musculoskeletal: Strength & Muscle Tone: Unable to assess due to telemedicine visit Gait & Station: Unable to assess due to telemedicine visit Patient leans: Unable to assess due to telemedicine visit  Psychiatric Specialty Exam: Review of Systems  Psychiatric/Behavioral:  Positive for dysphoric mood, hallucinations and sleep disturbance. Negative for decreased concentration, self-injury and suicidal ideas. The patient is nervous/anxious. The patient is not hyperactive.     There were no vitals taken for this visit.There is no height or weight on file to calculate BMI.  General Appearance: Casual  Eye Contact:  Good  Speech:  Clear and Coherent and Normal Rate  Volume:  Normal  Mood:  Anxious and Depressed  Affect:  Congruent  Thought Process:  Coherent, Goal Directed, and Descriptions of Associations: Intact  Orientation:  Full (Time, Place, and Person)  Thought Content: Hallucinations: Auditory   Suicidal Thoughts:  No  Homicidal Thoughts:  No  Memory:  Immediate;   Good Recent;   Good Remote;   Good  Judgement:  Good  Insight:  Good  Psychomotor Activity:  Normal  Concentration:  Concentration: Good and Attention Span: Good  Recall:  Good  Fund of Knowledge: Good  Language: Good  Akathisia:  No  Handed:  Right  AIMS (if indicated): done; 4  Assets:  Communication Skills Desire for Improvement Financial Resources/Insurance Housing Social Support  ADL's:  Intact  Cognition: WNL  Sleep:  Poor   Screenings: AIMS    Flowsheet Row Video Visit from 04/06/2024 in [facility] Video Visit from 03/09/2024 in [facility]  AIMS Total Score 4 4      GAD-7    Flowsheet Row Video Visit from 04/06/2024 in [facility] Video Visit from 03/09/2024 in [facility] Video Visit from 01/27/2024 in [facility] Video Visit from 12/16/2023 in  [facility] Video Visit from 10/09/2023 in [facility]  Total GAD-7 Score 16 17 16 18 17       PHQ2-9    Flowsheet Row Video Visit from 04/06/2024 in [facility] Video Visit from 03/09/2024 in [facility] Office Visit from 02/04/2024 in [facility] Video Visit from 01/27/2024 in [facility] Video Visit from 12/16/2023 in [facility]  PHQ-2 Total Score 5 4 0 5 5  PHQ-9 Total Score 15 13 -- 17 17      Flowsheet Row Video Visit from 04/06/2024 in [facility] Video Visit from 03/09/2024 in [facility] Video Visit from 01/27/2024 in [facility]  C-SSRS RISK CATEGORY Moderate Risk Moderate Risk Moderate Risk        Assessment and Plan:   Tina [name] is a 53 year old, African-American female with a past psychiatric history significant for bipolar 2 disorder (major depressive episode), generalized anxiety disorder, insomnia, and tobacco use disorder who presents to [facility] via virtual video visit for follow-up and medication management.  Patient reports that she forgot to pick up her samples of Caplyta  but was able to successfully discontinue her use of Seroquel .  She reports that she is continuing to take other medications as prescribed.    An aims assessment was performed with the patient scoring a 4.  Patient endorses minimal movements in her lips (perioral area), upper extremities, lower extremities, and hips.  There is a possibility that patient's involuntary movements may be attributed to spasms from the pain that she is currently in.  Patient would like to hold off on medication management for her involuntary movements at this  time.  Patient continues to endorse ongoing depression and anxiety partly attributed to her ongoing pain.  A PHQ-9 screen was performed with the patient scoring a 15.  A GAD-7 screen was also performed with the patient scoring a 16.  In addition to depression and anxiety, patient also expresses auditory hallucinations with her most recent episodes occurring yesterday.  She also endorses paranoia.  Provider reminded patient that she has Caplyta  samples (21-day supply) available to her at this facility.  Caplyta  42 mg daily as recommended for the management of patient's psychotic symptoms and for mood stability.  Patient verbalized understanding.  Provider also recommended patient discontinue her use of amitriptyline  since she is has not been experiencing improved sleep since being on the medication.  Provider recommended patient be placed on mirtazapine  7.5 mg at bedtime for the management of her mood and sleep.  Patient to continue taking all of her other medications as prescribed.  Patient was agreeable to recommendations.  Patient's medications to be e-prescribed to pharmacy of choice.  A [place] Suicide Severity Rating Scale was performed with the patient being considered moderate risk.  Patient  denies suicidal ideations and is able to contract for safety following the conclusion of the encounter.  Patient's labs pending.  Provider referred patient to cardiology for EKG.  Patient vocalized understanding.  Collaboration of Care: Collaboration of Care: Medication Management AEB provider managing patient's psychiatric medications, Primary Care Provider AEB patient being seen by primary care provider at [facility], Psychiatrist AEB patient being seen by a mental health provider at this facility, Other provider involved in patient's care AEB patient being seen by Podiatry, and Referral or follow-up with counselor/therapist AEB patient being seen by a licensed clinical social worker  at this facility  Patient/Guardian was advised Release of Information must be obtained prior to any record release in order to collaborate their care with an outside provider. Patient/Guardian was advised if they have not already done so to contact the registration department to sign all necessary forms in order for us  to release information regarding their care.   Consent: Patient/Guardian gives verbal consent for treatment and assignment of benefits for services provided during this visit. Patient/Guardian expressed understanding and agreed to proceed.   1. Generalized anxiety disorder  - busPIRone  (BUSPAR ) 30 MG tablet; Take 1 tablet (30 mg total) by mouth 2 (two) times daily.  Dispense: 60 tablet; Refill: 1 - gabapentin  (NEURONTIN ) 400 MG capsule; Take 1 capsule (400 mg total) by mouth 3 (three) times daily.  Dispense: 90 capsule; Refill: 1 - mirtazapine  (REMERON ) 7.5 MG tablet; Take 1 tablet (7.5 mg total) by mouth at bedtime.  Dispense: 30 tablet; Refill: 1  2. Bipolar 2 disorder, major depressive episode (HCC)  - lamoTRIgine  (LAMICTAL ) 100 MG tablet; Take 1 tablet (100 mg total) by mouth daily.  Dispense: 30 tablet; Refill: 1 - gabapentin  (NEURONTIN ) 400 MG capsule; Take 1 capsule (400 mg total) by mouth 3 (three) times daily.  Dispense: 90 capsule; Refill: 1 - lumateperone  tosylate (CAPLYTA ) 42 MG capsule; Take 1 capsule (42 mg total) by mouth daily.  Dispense: 21 capsule; Refill: 0  3. Insomnia due to other mental disorder (Primary)  4. Long term current use of antipsychotic medication Labs pending Referral to Cardiology  - Ambulatory referral to Cardiology - Comprehensive Metabolic Panel (CMET) - CBC with Differential/Platelet - Lipid panel  Patient to follow-up in 6 weeks Provider spent a total of 29 minutes with the patient/reviewing patient's chart  Gates Kasal, PA 04/07/2024, 10:07 AM

## 2024-04-08 NOTE — Telephone Encounter (Signed)
 Message acknowledged and reviewed.

## 2024-04-11 ENCOUNTER — Other Ambulatory Visit (HOSPITAL_COMMUNITY): Payer: Self-pay

## 2024-04-15 ENCOUNTER — Ambulatory Visit (INDEPENDENT_AMBULATORY_CARE_PROVIDER_SITE_OTHER): Admitting: Clinical

## 2024-04-15 DIAGNOSIS — F3181 Bipolar II disorder: Secondary | ICD-10-CM | POA: Diagnosis not present

## 2024-04-15 NOTE — Progress Notes (Signed)
 THERAPIST PROGRESS NOTE Virtual Visit via Video Note  I connected with Tina [name] on 04/15/24 at  8:00 AM EDT by a video enabled telemedicine application and verified that I am speaking with the correct person using two identifiers.  Location: Patient: [place]  Provider: [facility] office   I discussed the limitations of evaluation and management by telemedicine and the availability of in person appointments. The patient expressed understanding and agreed to proceed.   Follow Up Instructions: I discussed the assessment and treatment plan with the patient. The patient was provided an opportunity to ask questions and all were answered. The patient agreed with the plan and demonstrated an understanding of the instructions.   The patient was advised to call back or seek an in-person evaluation if the symptoms worsen or if the condition fails to improve as anticipated.   Session Time: 20 minutes  Participation Level: Active  Behavioral Response: CasualAlertEuthymic  Type of Therapy: Individual Therapy  Treatment Goals addressed: Reduce frequency, intensity, and duration of depression symptoms as evidenced by: the clients self 1x per session   ProgressTowards Goals: Progressing  Interventions: CBT and Supportive  Summary:  Tina [name] is a 53 y.o. female who presents for the scheduled appointment oriented x 5, appropriate dress, and friendly.  Client denied hallucinations and delusions. Client reported on today she is doing well.  Client reported she traveled to [place]  with some family to visit one of her cousins.  Client reported her other family who lives in [place]  already like to go back home but she decided to stay a little while longer with her cousin.  Client reported she needed a break from being in the house with her brother because things are very stressful and tense all the time.  Client reported she has been able to enjoy herself and  feel more at ease.  Client reported she is enjoying going to the beach and doing what she wants to do.  Client reported she will be back in [place]  by her next therapy appointment. Evidence of progress towards goal: Client reported 1 positive of being able to feel present to enjoy positive company and activities.   Suicidal/Homicidal: Nowithout intent/plan  Therapist Response:  Therapist began the appointment asking client how she has been doing since last seen. Therapist used CBT to engage with active listening and positive emotional support. Therapist used CBT to engage and asked the client about any changes that have occurred since last seen. Therapist used CBT to teach the client about being mindful and taking time to give herself breaks to have things to look forward to. Therapist used CBT to encourage medication compliance. Therapist used CBT ask the client to identify her progress with frequency of use with coping skills with continued practice in her daily activity.    Therapist assigned client homework to practice self-care.   Plan: Return again in 4 weeks.  Diagnosis: bipolar 2 disorder, mdd  Collaboration of Care: Patient refused AEB none requested by the client.  Patient/Guardian was advised Release of Information must be obtained prior to any record release in order to collaborate their care with an outside provider. Patient/Guardian was advised if they have not already done so to contact the registration department to sign all necessary forms in order for us  to release information regarding their care.   Consent: Patient/Guardian gives verbal consent for treatment and assignment of benefits for services provided during this visit. Patient/Guardian expressed understanding and agreed to proceed.   Tina [name]  Y Jettie Mannor, LCSW 04/15/2024

## 2024-04-20 ENCOUNTER — Ambulatory Visit

## 2024-04-20 VITALS — BP 132/73 | Ht 73.0 in | Wt 282.0 lb

## 2024-04-20 DIAGNOSIS — Z0000 Encounter for general adult medical examination without abnormal findings: Secondary | ICD-10-CM | POA: Diagnosis not present

## 2024-04-20 NOTE — Progress Notes (Signed)
 Because this visit was a virtual/telehealth visit,  certain criteria was not obtained, such a blood pressure, CBG if applicable, and timed get up and go. Any medications not marked as taking were not mentioned during the medication reconciliation part of the visit. Any vitals not documented were not able to be obtained due to this being a telehealth visit or patient was unable to self-report a recent blood pressure reading due to a lack of equipment at home via telehealth. Vitals that have been documented are verbally provided by the patient.  This visit was performed by a medical professional under my direct supervision. I was immediately available for consultation/collaboration. I have reviewed and agree with the Annual Wellness Visit documentation.  Subjective:   Tina [name] is a 53 y.o. who presents for a Medicare Wellness preventive visit.  As a reminder, Annual Wellness Visits don't include a physical exam, and some assessments may be limited, especially if this visit is performed virtually. We may recommend an in-person follow-up visit with your provider if needed.  Visit Complete: Virtual I connected with  Tina [name] on 04/20/24 by a audio enabled telemedicine application and verified that I am speaking with the correct person using two identifiers.  Patient Location: Home  Provider Location: Home Office  I discussed the limitations of evaluation and management by telemedicine. The patient expressed understanding and agreed to proceed.  Vital Signs: Because this visit was a virtual/telehealth visit, some criteria may be missing or patient reported. Any vitals not documented were not able to be obtained and vitals that have been documented are patient reported.  VideoDeclined- This patient declined Librarian, academic. Therefore the visit was completed with audio only.  Persons Participating in Visit: Patient.  AWV  Questionnaire: No: Patient Medicare AWV questionnaire was not completed prior to this visit.  Cardiac Risk Factors include: advanced age (>46men, >76 women);diabetes mellitus;obesity (BMI >30kg/m2);smoking/ tobacco exposure;hypertension;dyslipidemia     Objective:    Today's Vitals   04/20/24 1424  BP: 132/73  Weight: 282 lb (127.9 kg)  Height: 6' 1 (1.854 m)  PainSc: 8    Body mass index is 37.21 kg/m.     04/20/2024    2:23 PM 08/12/2023    9:09 AM 03/05/2023    9:10 AM 03/04/2023    9:11 AM 10/16/2022   10:50 AM 10/02/2022    8:38 AM 08/17/2022   10:48 AM  Advanced Directives  Does Patient Have a Medical Advance Directive? No Yes Yes Yes Yes Yes No  Type of Special educational needs teacher of [place];Living will Healthcare Power of [place];Living will Healthcare Power of [place];Living will Healthcare Power of [place];Living will Healthcare Power of [place];Living will   Does patient want to make changes to medical advance directive?  No - Patient declined  No - Patient declined No - Patient declined No - Patient declined   Copy of Healthcare Power of Attorney in Chart?  No - copy requested No - copy requested No - copy requested No - copy requested No - copy requested   Would patient like information on creating a medical advance directive? No - Patient declined          Current Medications (verified) Outpatient Encounter Medications as of 04/20/2024  Medication Sig   amLODipine -olmesartan  (AZOR ) 5-20 MG tablet Take 1 tablet by mouth daily.   atorvastatin  (LIPITOR) 40 MG tablet Take 1 tablet (40 mg total) by mouth daily.   busPIRone  (BUSPAR ) 30 MG tablet Take 1  tablet (30 mg total) by mouth 2 (two) times daily.   celecoxib  (CELEBREX ) 100 MG capsule Take 1 capsule (100 mg total) by mouth 2 (two) times daily.   Continuous Glucose Sensor (FREESTYLE LIBRE 3 PLUS SENSOR) MISC Change sensor every 15 days.   Continuous Glucose Sensor (FREESTYLE LIBRE 3 SENSOR) MISC Place 1  sensor on the skin every 14 days. Use to check glucose continuously   gabapentin  (NEURONTIN ) 400 MG capsule Take 1 capsule (400 mg total) by mouth 3 (three) times daily.   Insulin  Lispro Prot & Lispro (HUMALOG  MIX 75/25 KWIKPEN) (75-25) 100 UNIT/ML Kwikpen Inject 30 Units into the skin 2 (two) times daily before a meal.   lamoTRIgine  (LAMICTAL ) 100 MG tablet Take 1 tablet (100 mg total) by mouth daily.   lumateperone  tosylate (CAPLYTA ) 42 MG capsule Take 1 capsule (42 mg total) by mouth daily.   metFORMIN  (GLUCOPHAGE ) 1000 MG tablet Take 1 tablet (1,000 mg total) by mouth 2 (two) times daily with a meal.   mirtazapine  (REMERON ) 7.5 MG tablet Take 1 tablet (7.5 mg total) by mouth at bedtime.   nicotine  polacrilex (NICORETTE ) 4 MG gum Chew 2 pieces (8 mg total) by mouth as needed for smoking cessation.   QUEtiapine  (SEROQUEL ) 25 MG tablet Take 4 tablets (100 mg total) by mouth at bedtime for 7 days, THEN 2 tablets (50 mg total) at bedtime for 7 days, THEN 1 tablet (25 mg total) at bedtime for 7 days.   Semaglutide , 2 MG/DOSE, (OZEMPIC , 2 MG/DOSE,) 8 MG/3ML SOPN Inject 2 mg into the skin once a week.   blood glucose meter kit and supplies KIT Use up to 4 (four) times daily as directed   Blood Glucose Monitoring Suppl (CONTOUR NEXT ONE) KIT 1 each by Does not apply route See admin instructions. USE TO CHECK BLOOD GLUCOSE ONCE DAILY. IM PROGRAM.   cyclobenzaprine  (FLEXERIL ) 5 MG tablet Take 1 tablet (5 mg total) by mouth at bedtime as needed for muscle spasms.   glucose blood (CONTOUR NEXT TEST) test strip Please use to check your sugar 3 times a day.   glucose blood test strip Use up to 4 (four) times daily as directed   ibuprofen  (ADVIL ) 800 MG tablet Take 1 tablet (800 mg total) by mouth every 8 (eight) hours as needed.   Insulin  Pen Needle 32G X 4 MM MISC Use new pen needle with each insulin  injection two times daily   Lancets (ONETOUCH DELICA PLUS LANCET33G) MISC Use up to 4 (four) times daily as  directed   Lancets MISC 1 Units by Does not apply route 3 (three) times daily.   nitroGLYCERIN  (NITRO-DUR ) 0.2 mg/hr patch Cut patch into one - fourth pieces. Place a one-fourth (1/4) piece of patch on skin over affected area, changing to a new piece every 24 hours.   No facility-administered encounter medications on file as of 04/20/2024.    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   Family History  Problem Relation Age of Onset   Colon cancer Sister    Diabetes Mother    Diabetes Father    Diabetes Brother    Celiac disease Paternal Aunt    Social History   Socioeconomic History   Marital status: Single    Spouse name: Not on file   Number of children: 0   Years of education: 12   Highest education level: Some college, no degree  Occupational History   Occupation: TEFL teacher: UNEMPLOYED    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, academic  Tobacco Use   Smoking status: Every Day    Current packs/day: 0.50    Average packs/day: 0.5 packs/day for 23.0 years (11.5 ttl pk-yrs)    Types: Cigarettes   Smokeless tobacco: Never   Tobacco comments:    1/2PPD  Vaping Use   Vaping status: Never Used  Substance and Sexual Activity   Alcohol use: Yes    Alcohol/week: 0.0 standard drinks of alcohol    Comment: occ   Drug use: No   Sexual activity: Yes    Partners: Female    Birth control/protection: Injection    Comment: Depo  Other Topics Concern   Not on file  Social History Narrative   Homosexual; in a monogamous relationship w/  homosexual woman.   She is disabled.  She last worked in 2018 at [employer].    Right handed   One story home   Caffeine: 1 soda daily, occas drinks coffee    Social Drivers of Corporate investment banker Strain: Low Risk  (04/20/2024)   Overall Financial Resource Strain (CARDIA)    Difficulty of Paying Living Expenses: Not very hard  Food Insecurity: Food Insecurity Present (04/20/2024)   Hunger Vital Sign    Worried About Running Out of Food in the Last Year: Often true    Ran Out of Food in the Last Year: Often true  Transportation Needs: No Transportation Needs (04/20/2024)   PRAPARE - Administrator, Civil Service (Medical): No    Lack of Transportation (Non-Medical): No  Physical Activity: Sufficiently Active (04/20/2024)   Exercise Vital Sign    Days of Exercise per Week: 6 days    Minutes of Exercise per Session: 30 min  Recent Concern: Physical Activity - Insufficiently Active (02/03/2024)   Exercise Vital Sign    Days of Exercise per Week: 1 day    Minutes of Exercise per Session: 10 min  Stress: Stress Concern Present (04/20/2024)   [employer] of Occupational Health - Occupational Stress Questionnaire    Feeling of Stress: To some extent  Social Connections: Moderately Isolated (04/20/2024)   Social Connection and Isolation Panel    Frequency of Communication with Friends and Family: Three times a week    Frequency of Social Gatherings with Friends and Family: Once a week    Attends Religious Services: 1 to 4 times per year    Active Member of [employer] or Organizations: No    Attends Engineer, structural: Never    Marital Status: Never married    Tobacco Counseling Ready to quit: Not Answered Counseling given: Not Answered Tobacco comments: 1/2PPD    Clinical Intake:  Pre-visit preparation completed: Yes  Pain : 0-10 Pain Score: 8  Pain Type: Acute pain Pain Location: Leg Pain Orientation: Left Pain Descriptors / Indicators: Aching Pain  Onset: Today Pain Frequency: Intermittent Pain Relieving Factors: patient took ibuprofen   Pain  Relieving Factors: patient took ibuprofen   BMI - recorded: 37.21 Nutritional Status: BMI > 30  Obese Nutritional Risks: None Diabetes: Yes CBG done?: No Did pt. bring in CBG monitor from home?: No  Lab Results  Component Value Date   HGBA1C 6.4 (A) 02/04/2024   HGBA1C 7.5 (A) 11/02/2023   HGBA1C 7.4 (A) 08/12/2023     How often do you need to have someone help you when you read instructions, pamphlets, or other written materials from your doctor or pharmacy?: 1 - Never What is the last grade level you completed in school?: some college  Interpreter Needed?: No  Information entered by :: Kinnie Kaupp,cma   Activities of Daily Living     04/20/2024    2:27 PM 08/12/2023    9:13 AM  In your present state of health, do you have any difficulty performing the following activities:  Hearing? 0 0  Vision? 0 0  Difficulty concentrating or making decisions? 0 0  Walking or climbing stairs? 1 0  Dressing or bathing? 0 0  Doing errands, shopping? 0 0  Preparing Food and eating ? N   Using the Toilet? N   In the past six months, have you accidently leaked urine? N   Do you have problems with loss of bowel control? N   Managing your Medications? N   Managing your Finances? N   Housekeeping or managing your Housekeeping? N     Patient Care Team: Driscilla George, MD as PCP - General (Internal Medicine) Plyler, Melodie Spry, RD as Diabetes Educator (Dietician) Plyler, Melodie Spry, RD as Dietitian (Dietician) Princella Brooklyn, OD (Optometry) Devon Fogo, [facility] (Inactive) as Pharmacist (Pharmacist) Arlyne Bering, NP as Referring Physician (Psychiatry)  I have updated your Care Teams any recent Medical Services you may have received from other providers in the past year.     Assessment:   This is a routine wellness examination for [facility].  Hearing/Vision screen Hearing Screening -  Comments:: Patient declined any issues hearing Vision Screening - Comments:: No vision difficulties   Goals Addressed               This Visit's Progress     Patient Stated (pt-stated)        Patient has not thought about it yet        Depression Screen     04/20/2024    2:29 PM 04/06/2024   10:18 AM 03/09/2024   11:51 AM 02/04/2024    9:32 AM 01/27/2024   11:40 AM 12/16/2023   10:40 AM 10/09/2023    3:40 PM  PHQ 2/9 Scores  PHQ - 2 Score 2   0     PHQ- 9 Score 3           Information is confidential and restricted. Go to Review Flowsheets to unlock data.    Fall Risk     04/20/2024    2:27 PM 02/04/2024    9:32 AM 08/12/2023    9:13 AM 03/05/2023    9:10 AM 03/04/2023    9:11 AM  Fall Risk   Falls in the past year? 1 1 0 1 1  Number falls in past yr: 0 0 0 1 0  Injury with Fall? 1 1 0 0 1  Risk for fall due to : No Fall Risks Impaired balance/gait  Impaired balance/gait Impaired balance/gait  Follow up Falls evaluation completed Falls evaluation completed Falls evaluation completed Falls evaluation completed;Falls prevention discussed  MEDICARE RISK AT HOME:  Medicare Risk at Home Any stairs in or around the home?: Yes If so, are there any without handrails?: No Home free of loose throw rugs in walkways, pet beds, electrical cords, etc?: Yes Adequate lighting in your home to reduce risk of falls?: Yes Life alert?: No Use of a cane, walker or w/c?: Yes (cane) Grab bars in the bathroom?: Yes Shower chair or bench in shower?: No Elevated toilet seat or a handicapped toilet?: Yes  TIMED UP AND GO:  Was the test performed?  No  Cognitive Function: 6CIT completed        04/20/2024    2:26 PM 03/05/2023    9:11 AM  6CIT Screen  What Year? 0 points 0 points  What month? 0 points 0 points  What time? 0 points 0 points  Count back from 20 0 points 0 points  Months in reverse 0 points 0 points  Repeat phrase 0 points 0 points  Total Score 0 points 0 points     Immunizations Immunization History  Administered Date(s) Administered   Tdap 08/29/2016    Screening Tests Health Maintenance  Topic Date Due   COVID-19 Vaccine (1) Never done   Pneumococcal Vaccine 34-66 Years old (1 of 2 - PCV) Never done   Zoster Vaccines- Shingrix (1 of 2) Never done   Colonoscopy  01/11/2018   OPHTHALMOLOGY EXAM  01/17/2023   Diabetic kidney evaluation - Urine ACR  03/03/2024   HEMOGLOBIN A1C  05/05/2024   INFLUENZA VACCINE  06/03/2024   Diabetic kidney evaluation - eGFR measurement  08/11/2024   FOOT EXAM  08/11/2024   LIPID PANEL  08/11/2024   COLON CANCER SCREENING ANNUAL FOBT  08/25/2024   Medicare Annual Wellness (AWV)  04/20/2025   MAMMOGRAM  01/28/2026   DTaP/Tdap/Td (2 - Td or Tdap) 08/29/2026   Cervical Cancer Screening (HPV/Pap Cotest)  08/11/2028   Hepatitis C Screening  Completed   HIV Screening  Completed   HPV VACCINES  Aged Out   Meningococcal B Vaccine  Aged Out    Health Maintenance  Health Maintenance Due  Topic Date Due   COVID-19 Vaccine (1) Never done   Pneumococcal Vaccine 59-52 Years old (1 of 2 - PCV) Never done   Zoster Vaccines- Shingrix (1 of 2) Never done   Colonoscopy  01/11/2018   OPHTHALMOLOGY EXAM  01/17/2023   Diabetic kidney evaluation - Urine ACR  03/03/2024   Health Maintenance Items Addressed:   Additional Screening:  Vision Screening: Recommended annual ophthalmology exams for early detection of glaucoma and other disorders of the eye. Would you like a referral to an eye doctor? No    Dental Screening: Recommended annual dental exams for proper oral hygiene  Community Resource Referral / Chronic Care Management: CRR required this visit?  No   CCM required this visit?  No   Plan:    I have personally reviewed and noted the following in the patient's chart:   Medical and social history Use of alcohol, tobacco or illicit drugs  Current medications and supplements including opioid  prescriptions. Patient is not currently taking opioid prescriptions. Functional ability and status Nutritional status Physical activity Advanced directives List of other physicians Hospitalizations, surgeries, and ER visits in previous 12 months Vitals Screenings to include cognitive, depression, and falls Referrals and appointments  In addition, I have reviewed and discussed with patient certain preventive protocols, quality metrics, and best practice recommendations. A written personalized care plan for preventive services  as well as general preventive health recommendations were provided to patient.   Freeda Jerry, [place]   04/20/2024   After Visit Summary: (MyChart) Due to this being a telephonic visit, the after visit summary with patients personalized plan was offered to patient via MyChart   Notes: Nothing significant to report at this time.

## 2024-04-20 NOTE — Patient Instructions (Signed)
 Tina [name] , Thank you for taking time out of your busy schedule to complete your Annual Wellness Visit with me. I enjoyed our conversation and look forward to speaking with you again next year. I, as well as your care team,  appreciate your ongoing commitment to your health goals. Please review the following plan we discussed and let me know if I can assist you in the future. Your Game plan/ To Do List    Referrals: If you haven't heard from the office you've been referred to, please reach out to them at the phone provided.  none Follow up Visits: Next Medicare AWV with our clinical staff: 04/26/2025   Have you seen your provider in the last 6 months (3 months if uncontrolled diabetes)? No Next Office Visit with your provider:   Clinician Recommendations:  Aim for 30 minutes of exercise or brisk walking, 6-8 glasses of water, and 5 servings of fruits and vegetables each day.       This is a list of the screening recommended for you and due dates:  Health Maintenance  Topic Date Due   COVID-19 Vaccine (1) Never done   Pneumococcal Vaccination (1 of 2 - PCV) Never done   Zoster (Shingles) Vaccine (1 of 2) Never done   Colon Cancer Screening  01/11/2018   Eye exam for diabetics  01/17/2023   Yearly kidney health urinalysis for diabetes  03/03/2024   Hemoglobin A1C  05/05/2024   Flu Shot  06/03/2024   Yearly kidney function blood test for diabetes  08/11/2024   Complete foot exam   08/11/2024   Lipid (cholesterol) test  08/11/2024   Stool Blood Test  08/25/2024   Medicare Annual Wellness Visit  04/20/2025   Mammogram  01/28/2026   DTaP/Tdap/Td vaccine (2 - Td or Tdap) 08/29/2026   Pap with HPV screening  08/11/2028   Hepatitis C Screening  Completed   HIV Screening  Completed   HPV Vaccine  Aged Out   Meningitis B Vaccine  Aged Out    Advanced directives: (Declined) Advance directive discussed with you today. Even though you declined this today, please call our office should  you change your mind, and we can give you the proper paperwork for you to fill out. Advance Care Planning is important because it:  [x]  Makes sure you receive the medical care that is consistent with your values, goals, and preferences  [x]  It provides guidance to your family and loved ones and reduces their decisional burden about whether or not they are making the right decisions based on your wishes.  Follow the link provided in your after visit summary or read over the paperwork we have mailed to you to help you started getting your Advance Directives in place. If you need assistance in completing these, please reach out to us  so that we can help you!  See attachments for Preventive Care and Fall Prevention Tips.

## 2024-04-22 DIAGNOSIS — Z4800 Encounter for change or removal of nonsurgical wound dressing: Secondary | ICD-10-CM | POA: Diagnosis not present

## 2024-04-22 DIAGNOSIS — F17200 Nicotine dependence, unspecified, uncomplicated: Secondary | ICD-10-CM | POA: Diagnosis not present

## 2024-04-22 DIAGNOSIS — L02214 Cutaneous abscess of groin: Secondary | ICD-10-CM | POA: Diagnosis not present

## 2024-04-22 DIAGNOSIS — Z48817 Encounter for surgical aftercare following surgery on the skin and subcutaneous tissue: Secondary | ICD-10-CM | POA: Diagnosis not present

## 2024-04-25 ENCOUNTER — Other Ambulatory Visit (HOSPITAL_COMMUNITY): Payer: Self-pay | Admitting: Physician Assistant

## 2024-04-25 ENCOUNTER — Other Ambulatory Visit: Payer: Self-pay | Admitting: Podiatry

## 2024-04-25 ENCOUNTER — Other Ambulatory Visit (HOSPITAL_COMMUNITY): Payer: Self-pay

## 2024-04-25 ENCOUNTER — Telehealth: Payer: Self-pay | Admitting: Podiatry

## 2024-04-25 DIAGNOSIS — F3181 Bipolar II disorder: Secondary | ICD-10-CM

## 2024-04-25 MED ORDER — CELECOXIB 100 MG PO CAPS
100.0000 mg | ORAL_CAPSULE | Freq: Two times a day (BID) | ORAL | 1 refills | Status: DC
  Filled 2024-04-25: qty 30, 15d supply, fill #0
  Filled 2024-05-09: qty 30, 15d supply, fill #1

## 2024-04-25 NOTE — Telephone Encounter (Signed)
 Req'ing refill for: celecoxib  (CELEBREX ) 100 MG capsule sent to pharmacy on file

## 2024-04-28 ENCOUNTER — Other Ambulatory Visit: Payer: Self-pay | Admitting: Internal Medicine

## 2024-04-28 ENCOUNTER — Other Ambulatory Visit: Payer: Self-pay

## 2024-04-28 ENCOUNTER — Other Ambulatory Visit (HOSPITAL_COMMUNITY): Payer: Self-pay

## 2024-04-28 ENCOUNTER — Encounter (HOSPITAL_COMMUNITY): Payer: Self-pay

## 2024-04-28 MED ORDER — FREESTYLE LIBRE 3 PLUS SENSOR MISC
3 refills | Status: DC
  Filled 2024-04-28: qty 2, 30d supply, fill #0
  Filled 2024-05-09 – 2024-05-23 (×4): qty 2, 30d supply, fill #1
  Filled 2024-06-06 – 2024-06-17 (×2): qty 2, 30d supply, fill #2
  Filled 2024-06-27 – 2024-07-22 (×2): qty 2, 30d supply, fill #3

## 2024-04-28 MED ORDER — FREESTYLE LIBRE 3 PLUS SENSOR MISC
1 refills | Status: DC
Start: 2024-04-28 — End: 2024-04-28
  Filled 2024-04-28: qty 1, fill #0

## 2024-04-28 NOTE — Telephone Encounter (Signed)
 Copied from CRM [id number]. Topic: Clinical - Medication Refill >> Apr 28, 2024  9:35 AM Zane F wrote: Patient is completely out of the sensors and in need of refill.   Medication: Continuous Glucose Sensor (FREESTYLE LIBRE 3 PLUS SENSOR) MISC  Has the patient contacted their pharmacy? Yes   This is the patient's preferred pharmacy:  [facility] [facility] [address], [address] [place] [address] Phone: [phone] Fax: [phone]  Is this the correct pharmacy for this prescription? Yes   Has the prescription been filled recently? No  Is the patient out of the medication? Yes  Has the patient been seen for an appointment in the last year OR does the patient have an upcoming appointment? Yes  Can we respond through MyChart? Yes  Agent: Please be advised that Rx refills may take up to 3 business days. We ask that you follow-up with your pharmacy.

## 2024-05-02 ENCOUNTER — Other Ambulatory Visit (HOSPITAL_COMMUNITY): Payer: Self-pay

## 2024-05-03 ENCOUNTER — Ambulatory Visit: Admitting: Podiatry

## 2024-05-09 ENCOUNTER — Other Ambulatory Visit: Payer: Self-pay

## 2024-05-09 ENCOUNTER — Other Ambulatory Visit: Payer: Self-pay | Admitting: Internal Medicine

## 2024-05-09 ENCOUNTER — Telehealth: Payer: Self-pay | Admitting: *Deleted

## 2024-05-09 ENCOUNTER — Other Ambulatory Visit (HOSPITAL_COMMUNITY): Payer: Self-pay

## 2024-05-09 ENCOUNTER — Encounter (HOSPITAL_COMMUNITY): Payer: Self-pay

## 2024-05-09 DIAGNOSIS — E1165 Type 2 diabetes mellitus with hyperglycemia: Secondary | ICD-10-CM

## 2024-05-09 MED ORDER — METFORMIN HCL 1000 MG PO TABS
1000.0000 mg | ORAL_TABLET | Freq: Two times a day (BID) | ORAL | 3 refills | Status: DC
  Filled 2024-05-09: qty 60, 30d supply, fill #0
  Filled 2024-06-06: qty 60, 30d supply, fill #1
  Filled 2024-06-27 – 2024-07-06 (×3): qty 60, 30d supply, fill #2
  Filled 2024-08-04: qty 60, 30d supply, fill #3

## 2024-05-09 MED ORDER — INSULIN LISPRO PROT & LISPRO (75-25 MIX) 100 UNIT/ML KWIKPEN
30.0000 [IU] | PEN_INJECTOR | Freq: Two times a day (BID) | SUBCUTANEOUS | 2 refills | Status: DC
  Filled 2024-05-09: qty 18, 30d supply, fill #0
  Filled 2024-06-03: qty 18, 30d supply, fill #1
  Filled 2024-06-27 (×2): qty 18, 30d supply, fill #2

## 2024-05-09 NOTE — Telephone Encounter (Unsigned)
 Copied from CRM [id number]. Topic: Clinical - Medication Refill >> May 09, 2024  9:53 AM Vivian Z wrote: Medication: Insulin  Lispro Prot & Lispro (HUMALOG  MIX 75/25 KWIKPEN) (75-25) 100 UNIT/ML Kwikpen metFORMIN  (GLUCOPHAGE ) 1000 MG tablet  Has the patient contacted their pharmacy? Yes (Agent: If no, request that the patient contact the pharmacy for the refill. If patient does not wish to contact the pharmacy document the reason why and proceed with request.) (Agent: If yes, when and what did the pharmacy advise?)  This is the patient's preferred pharmacy:  [place] [facility] [address] [place] [address] Phone: [phone] Fax: [phone]  Is this the correct pharmacy for this prescription? Yes If no, delete pharmacy and type the correct one.   Has the prescription been filled recently? No  Is the patient out of the medication? Yes  Has the patient been seen for an appointment in the last year OR does the patient have an upcoming appointment? Yes  Can we respond through MyChart? No  Agent: Please be advised that Rx refills may take up to 3 business days. We ask that you follow-up with your pharmacy. >> May 09, 2024  2:42 PM Fredrica W wrote: Patient called. States she received a message medication was denied. She needs to know why insulin  Humalog  was denied. She does not have any insulin  and would like a call back to discuss Rx Denial. Thank You

## 2024-05-09 NOTE — Telephone Encounter (Unsigned)
 Copied from CRM [id number]. Topic: Clinical - Medication Refill >> May 09, 2024  9:53 AM Vivian Z wrote: Medication: Insulin  Lispro Prot & Lispro (HUMALOG  MIX 75/25 KWIKPEN) (75-25) 100 UNIT/ML Kwikpen metFORMIN  (GLUCOPHAGE ) 1000 MG tablet  Has the patient contacted their pharmacy? Yes (Agent: If no, request that the patient contact the pharmacy for the refill. If patient does not wish to contact the pharmacy document the reason why and proceed with request.) (Agent: If yes, when and what did the pharmacy advise?)  This is the patient's preferred pharmacy:   - [facility] [address] [place] [address] Phone: [phone] Fax: [phone]  Is this the correct pharmacy for this prescription? Yes If no, delete pharmacy and type the correct one.   Has the prescription been filled recently? No  Is the patient out of the medication? Yes  Has the patient been seen for an appointment in the last year OR does the patient have an upcoming appointment? Yes  Can we respond through MyChart? No  Agent: Please be advised that Rx refills may take up to 3 business days. We ask that you follow-up with your pharmacy.

## 2024-05-09 NOTE — Telephone Encounter (Signed)
 Copied from CRM [id number]. Topic: Clinical - Medication Refill >> May 09, 2024  9:53 AM Vivian Z wrote: Medication: Insulin  Lispro Prot & Lispro (HUMALOG  MIX 75/25 KWIKPEN) (75-25) 100 UNIT/ML Kwikpen metFORMIN  (GLUCOPHAGE ) 1000 MG tablet  Has the patient contacted their pharmacy? Yes (Agent: If no, request that the patient contact the pharmacy for the refill. If patient does not wish to contact the pharmacy document the reason why and proceed with request.) (Agent: If yes, when and what did the pharmacy advise?)  This is the patient's preferred pharmacy:  [facility] [address] [place] [address] Phone: [phone] Fax: [phone]  Is this the correct pharmacy for this prescription? Yes If no, delete pharmacy and type the correct one.   Has the prescription been filled recently? No  Is the patient out of the medication? Yes  Has the patient been seen for an appointment in the last year OR does the patient have an upcoming appointment? Yes  Can we respond through MyChart? No  Agent: Please be advised that Rx refills may take up to 3 business days. We ask that you follow-up with your pharmacy. >> May 09, 2024  2:42 PM Fredrica W wrote: Patient called. States she received a message medication was denied. She needs to know why insulin  Humalog  was denied. She does not have any insulin  and would like a call back to discuss Rx Denial. Thank You

## 2024-05-10 ENCOUNTER — Telehealth: Payer: Self-pay | Admitting: *Deleted

## 2024-05-10 ENCOUNTER — Other Ambulatory Visit (HOSPITAL_COMMUNITY): Payer: Self-pay

## 2024-05-10 NOTE — Telephone Encounter (Signed)
 Copied from CRM [id number]. Topic: Clinical - Medication Question >> May 09, 2024  3:39 PM [name] wrote: Reason for CRM: Patient is calling and needs to speak to someone about her medication, she wouldn't tell me about what she didn't want to speak to me at all, callback number is [phone].

## 2024-05-10 NOTE — Telephone Encounter (Signed)
 Pt stated Kenneth had called her yesterday; no further assistance needed.

## 2024-05-12 ENCOUNTER — Ambulatory Visit: Admitting: Student

## 2024-05-12 ENCOUNTER — Telehealth: Payer: Self-pay | Admitting: Dietician

## 2024-05-12 ENCOUNTER — Other Ambulatory Visit (HOSPITAL_COMMUNITY): Payer: Self-pay

## 2024-05-12 VITALS — BP 138/74 | HR 89 | Ht 73.0 in | Wt 272.8 lb

## 2024-05-12 DIAGNOSIS — E1165 Type 2 diabetes mellitus with hyperglycemia: Secondary | ICD-10-CM

## 2024-05-12 DIAGNOSIS — Z308 Encounter for other contraceptive management: Secondary | ICD-10-CM

## 2024-05-12 DIAGNOSIS — M7062 Trochanteric bursitis, left hip: Secondary | ICD-10-CM | POA: Diagnosis not present

## 2024-05-12 DIAGNOSIS — Z3042 Encounter for surveillance of injectable contraceptive: Secondary | ICD-10-CM | POA: Diagnosis not present

## 2024-05-12 DIAGNOSIS — Z7984 Long term (current) use of oral hypoglycemic drugs: Secondary | ICD-10-CM

## 2024-05-12 DIAGNOSIS — Z794 Long term (current) use of insulin: Secondary | ICD-10-CM

## 2024-05-12 LAB — POCT GLYCOSYLATED HEMOGLOBIN (HGB A1C): Hemoglobin A1C: 6.1 % — AB (ref 4.0–5.6)

## 2024-05-12 LAB — GLUCOSE, CAPILLARY: Glucose-Capillary: 100 mg/dL — ABNORMAL HIGH (ref 70–99)

## 2024-05-12 MED ORDER — MEDROXYPROGESTERONE ACETATE 104 MG/0.65ML ~~LOC~~ SUSY
104.0000 mg | PREFILLED_SYRINGE | Freq: Once | SUBCUTANEOUS | Status: AC
  Administered 2024-05-12: 104 mg via SUBCUTANEOUS

## 2024-05-12 MED ORDER — KETOROLAC TROMETHAMINE 30 MG/ML IJ SOLN
30.0000 mg | Freq: Once | INTRAMUSCULAR | Status: AC
  Administered 2024-05-12: 30 mg via INTRAMUSCULAR

## 2024-05-12 NOTE — Patient Instructions (Signed)
 Thank you, [name] for allowing us  to provide your care today. Today we discussed:  - Please continue taking your medications as prescribed  -Please make sure you call your eye doctor to schedule exam  -You received a Toradol  injection in your left hip  I have ordered the following labs for you:   Lab Orders         Glucose, capillary         Microalbumin / Creatinine Urine Ratio         POC Hbg A1C      Tests ordered today:  None  Referrals ordered today:   Referral Orders  No referral(s) requested today     I have ordered the following medication/changed the following medications:   Stop the following medications: Medications Discontinued During This Encounter  Medication Reason   blood glucose meter kit and supplies KIT    Blood Glucose Monitoring Suppl (CONTOUR NEXT ONE) KIT    Continuous Glucose Sensor (FREESTYLE LIBRE 3 SENSOR) MISC    cyclobenzaprine  (FLEXERIL ) 5 MG tablet    glucose blood (CONTOUR NEXT TEST) test strip    glucose blood test strip    ibuprofen  (ADVIL ) 800 MG tablet    Insulin  Pen Needle 32G X 4 MM MISC    Lancets MISC    Lancets (ONETOUCH DELICA PLUS LANCET33G) MISC    nitroGLYCERIN  (NITRO-DUR ) 0.2 mg/hr patch      Start the following medications: Meds ordered this encounter  Medications   ketorolac  (TORADOL ) 30 MG/ML injection 30 mg     Follow up: 6 months diabetes   Remember:   Should you have any questions or concerns please call the internal medicine clinic at [phone].     Tina [name], D.O. [facility]

## 2024-05-12 NOTE — Telephone Encounter (Signed)
 Eye exam- Tina [name] states she is on the wait list at [facility] care.

## 2024-05-12 NOTE — Progress Notes (Signed)
 Established Patient Office Visit  Subjective   [name], female    DOB: April 07, 1971  Age: 53 y.o. MRN: [id number]  Chief Complaint  Patient presents with   Contraception    Depo   Sciatica    Back pain radiating down her left leg, has had pain for 2 days requesting a muscle relaxer and a Toradol  injection    HPI This is a 53 year old female with past medical history of type 2 diabetes, controlled, on long-term insulin  presents today for diabetes follow-up and depo- injection.   ROS   As per assessment and plan Objective:     BP 138/74 (BP Location: Left Arm, Patient Position: Sitting, Cuff Size: Normal)   Pulse 89   Ht 6' 1 (1.854 m)   Wt 272 lb 12.8 oz (123.7 kg)   SpO2 97%   BMI 35.99 kg/m  BP Readings from Last 3 Encounters:  05/12/24 138/74  04/20/24 132/73  02/04/24 132/73   Wt Readings from Last 3 Encounters:  05/12/24 272 lb 12.8 oz (123.7 kg)  04/20/24 282 lb (127.9 kg)  02/04/24 273 lb 8 oz (124.1 kg)      Physical Exam  General: Sitting in chair, no acute distress Cardiovascular: Regular rate Pulmonary: Breathing comfortably Abdomen: Soft, nontender, nondistended MSK: +TTP Left greater trochanter, ROM limited due to pain  Results for orders placed or performed in visit on 05/12/24  Glucose, capillary  Result Value Ref Range   Glucose-Capillary 100 (H) 70 - 99 mg/dL  Microalbumin / Creatinine Urine Ratio  Result Value Ref Range   Creatinine, Urine 318.8 Not Estab. mg/dL   Microalbumin, Urine 86.2 Not Estab. ug/mL   Microalb/Creat Ratio 4 0 - 29 mg/g creat  POC Hbg A1C  Result Value Ref Range   Hemoglobin A1C 6.1 (A) 4.0 - 5.6 %   HbA1c POC (<> result, manual entry)     HbA1c, POC (prediabetic range)     HbA1c, POC (controlled diabetic range)      Last metabolic panel Lab Results  Component Value Date   GLUCOSE 111 (H) 08/12/2023   NA 141 08/12/2023   K 4.2 08/12/2023   CL 105 08/12/2023   CO2 16 (L)  08/12/2023   BUN 8 08/12/2023   CREATININE 0.63 08/12/2023   EGFR 105 08/12/2023   CALCIUM  9.4 08/12/2023   PROT 7.6 08/12/2023   ALBUMIN 4.3 08/12/2023   LABGLOB 3.3 08/12/2023   AGRATIO 1.6 01/07/2019   BILITOT <0.2 08/12/2023   ALKPHOS 94 08/12/2023   AST 18 08/12/2023   ALT 22 08/12/2023   ANIONGAP 12 06/27/2021   Last lipids Lab Results  Component Value Date   CHOL 151 08/12/2023   HDL 32 (L) 08/12/2023   LDLCALC 93 08/12/2023   TRIG 149 08/12/2023   CHOLHDL 4.7 (H) 08/12/2023   Last hemoglobin A1c Lab Results  Component Value Date   HGBA1C 6.1 (A) 05/12/2024      The ASCVD Risk score (Arnett DK, et al., 2019) failed to calculate for the following reasons:   Unable to determine if patient is Non-Hispanic African American    Assessment & Plan:  Patient is discussed with Dr. Rosan Problem List Items Addressed This Visit       Endocrine   Type 2 diabetes mellitus (HCC) - Primary (Chronic)   Last A1c 6.4 checked on 02/04/2024; A1c today 6.1; Currently taking Humalog  mix 75/25; 30 units 2 times daily before meals and metformin  1000 mg twice daily,  Ozempic  2 mg weekly. CGM data reviewed; Target range 98%, average glucose 120 - Urine ACR today - Continue current regimen      Relevant Orders   POC Hbg A1C (Completed)   Microalbumin / Creatinine Urine Ratio (Completed)     Musculoskeletal and Integument   Trochanteric bursitis of left hip   Patient with past medical history of trochanteric bursitis, lumbar disc herniation with radiculopathy presents today with acute on chronic onset of low back pain.  Reports that started 2 days ago, the pain starts in low back and radiates down left lower extremity.  Reports the pain is constant, it is worse with laying down and walking.  She is able to bear weight however she uses a cane to walk.  No fevers or chills.  Reports that she has received Toradol  injection previously that helped with her overall symptoms.  On exam, she has  tenderness to palpation of the left greater trochanter that is exacerbated with straight leg test. - Toradol  30 mg injection received by RN -Patient advised to return to clinic if her symptoms worsen        Other   Encounter for contraceptive management   Depo shot received today.      Other Visit Diagnoses       Surveillance for Depo-Provera  contraception       Relevant Medications   medroxyPROGESTERone  (DEPO-SUBQ PROVERA  104) injection 104 mg (Completed)       Return in about 6 months (around 11/12/2024) for Diabetes.    Toma Edwards, DO

## 2024-05-13 ENCOUNTER — Ambulatory Visit (INDEPENDENT_AMBULATORY_CARE_PROVIDER_SITE_OTHER): Admitting: Clinical

## 2024-05-13 ENCOUNTER — Ambulatory Visit: Payer: Self-pay | Admitting: Student

## 2024-05-13 DIAGNOSIS — F3181 Bipolar II disorder: Secondary | ICD-10-CM

## 2024-05-13 LAB — MICROALBUMIN / CREATININE URINE RATIO
Creatinine, Urine: 318.8 mg/dL
Microalb/Creat Ratio: 4 mg/g{creat} (ref 0–29)
Microalbumin, Urine: 13.7 ug/mL

## 2024-05-13 NOTE — Progress Notes (Unsigned)
 THERAPIST PROGRESS NOTE Virtual Visit via Video Note  I connected with Jesscia Imm Sandoval on 05/13/24 at  8:00 AM EDT by a video enabled telemedicine application and verified that I am speaking with the correct person using two identifiers.  Location: Patient: Home Provider: Office   I discussed the limitations of evaluation and management by telemedicine and the availability of in person appointments. The patient expressed understanding and agreed to proceed.   Follow Up Instructions: I discussed the assessment and treatment plan with the patient. The patient was provided an opportunity to ask questions and all were answered. The patient agreed with the plan and demonstrated an understanding of the instructions.   The patient was advised to call back or seek an in-person evaluation if the symptoms worsen or if the condition fails to improve as anticipated.   Session Time: 30 minutes  Participation Level: Active  Behavioral Response: CasualAlertDepressed  Type of Therapy: Individual Therapy  Treatment Goals addressed:  Jennifermarie WILL SCORE LESS THAN 10 ON THE PATIENT HEALTH QUESTIONNAIRE (PHQ-9)   ProgressTowards Goals: Progressing  Interventions: CBT and Supportive  Summary:  Breannah Kratt is a 53 y.o. female who presents for scheduled appointment oriented x 5, appropriately dressed, and friendly.  Client denied hallucinations or delusions. Client reported on today a lot has changed.  Client reported this past week she recently returned back from [place]  visiting with her other family.  Client reported upon coming home she learned that the brother that she has been living with told her she has to get her things out of the house so he can move his girlfriend in.  Client reported her brother who lives outside the home apparently knew about her brothers plans to move her out but did not tell her.  Client reported she feels like that was a bit of betrayal on  his part for not informing her.  Client reported she has a friend who works in Social worker who is helping her to gather documents about the house so she can go to court for probate.  Client reported her niece that lives in [place] has offered to come help.  Pack up her things and put it into the storage unit.  Client reported with her birthday being next week she can believe that she is having to go through this then we will come to this point. Evidence of progress towards goal: Client reported 1 positive despite going through a stressful situation is being able to appropriately use resources to help her problem solve.  Suicidal/Homicidal: Nowithout intent/plan  Therapist Response:  Therapist began the appointment asking the client how she has been doing since last seen. Therapist engaged with active listening and positive emotional support. Therapist used CBT to ask the client to identify the source of her negative thoughts and feelings. Therapist used CBT to normalize her emotional response related to her family stressor. Therapist used CBT to collaborate with the client on positively reinforcing her strengths to utilize for support and resources to help her problem solve. Therapist used CBT to continue teaching her about appropriate boundaries and reframing negative cognitions. Therapist used CBT ask the client to identify her progress with frequency of use with coping skills with continued practice in her daily activity.       Plan: Return again in 3 weeks.  Diagnosis: bipolar 2,mdd  Collaboration of Care: Patient refused AEB none requested.  Patient/Guardian was advised Release of Information must be obtained prior to any record release in order  to collaborate their care with an outside provider. Patient/Guardian was advised if they have not already done so to contact the registration department to sign all necessary forms in order for us  to release information regarding their care.    Consent: Patient/Guardian gives verbal consent for treatment and assignment of benefits for services provided during this visit. Patient/Guardian expressed understanding and agreed to proceed.   Laynee Lockamy Y Myiah Petkus, LCSW 05/13/2024

## 2024-05-13 NOTE — Assessment & Plan Note (Signed)
 Last A1c 6.4 checked on 02/04/2024; A1c today 6.1; Currently taking Humalog  mix 75/25; 30 units 2 times daily before meals and metformin  1000 mg twice daily, Ozempic  2 mg weekly. CGM data reviewed; Target range 98%, average glucose 120 - Urine ACR today - Continue current regimen

## 2024-05-13 NOTE — Assessment & Plan Note (Signed)
 Depo shot received today.

## 2024-05-13 NOTE — Assessment & Plan Note (Signed)
 Patient with past medical history of trochanteric bursitis, lumbar disc herniation with radiculopathy presents today with acute on chronic onset of low back pain.  Reports that started 2 days ago, the pain starts in low back and radiates down left lower extremity.  Reports the pain is constant, it is worse with laying down and walking.  She is able to bear weight however she uses a cane to walk.  No fevers or chills.  Reports that she has received Toradol  injection previously that helped with her overall symptoms.  On exam, she has tenderness to palpation of the left greater trochanter that is exacerbated with straight leg test. - Toradol  30 mg injection received by RN -Patient advised to return to clinic if her symptoms worsen

## 2024-05-16 ENCOUNTER — Encounter: Payer: Self-pay | Admitting: *Deleted

## 2024-05-18 ENCOUNTER — Ambulatory Visit: Payer: Self-pay | Admitting: *Deleted

## 2024-05-18 ENCOUNTER — Telehealth: Payer: Self-pay | Admitting: *Deleted

## 2024-05-18 ENCOUNTER — Other Ambulatory Visit (HOSPITAL_COMMUNITY): Payer: Self-pay

## 2024-05-18 MED ORDER — CYCLOBENZAPRINE HCL 5 MG PO TABS
5.0000 mg | ORAL_TABLET | Freq: Every evening | ORAL | 0 refills | Status: DC | PRN
  Filled 2024-05-18: qty 30, 30d supply, fill #0

## 2024-05-18 NOTE — Telephone Encounter (Signed)
 RTC from patient states was told at last visit if the Toradol  did not help to call and a prescription for Flexeril  would be sent in for her.  Send prescription to the [facility] on [address]. Copied from CRM [id number]. Topic: General - Other >> May 18, 2024  9:34 AM [name] wrote: Reason for CRM: Patient was returning a call for Kenneth, could you assist and calling her back, callback number is [phone].

## 2024-05-18 NOTE — Telephone Encounter (Signed)
 FYI Only or Action Required?: Action required by provider: medication refill request and prescription for flexeril  .  Patient was last seen in primary care on 05/12/2024 by Heddy Barren, DO.  Called Nurse Triage reporting Back Pain.  Symptoms began several days ago.  Interventions attempted: Other: toradol  injection in office .  Symptoms are: gradually worsening.  Triage Disposition: See PCP When Office is Open (Within 3 Days)  Patient/caregiver understands and will follow disposition?: No, wishes to speak with PCP                Copied from CRM [id number]. Topic: Clinical - Red Word Triage >> May 18, 2024  9:03 AM Carrielelia G wrote: [facility] that prompted transfer to Nurse Triage: lower back pain and radiating down left hip and leg   ----------------------------------------------------------------------- Reason for Disposition  [1] MODERATE back pain (e.g., interferes with normal activities) AND [2] present > 3 days  Answer Assessment - Initial Assessment Questions Patient requesting flexeril  prescription. Pain in back and left leg returning and reports MD recommended to call back and get flexeril  to try if pain returns after receiving toradol  injection on 05/12/24. Please advise if another appt needed.       1. ONSET: When did the pain begin? (e.g., minutes, hours, days)     Couple days after toradol  injection given on 7/10 /25  pain returned  2. LOCATION: Where does it hurt? (upper, mid or lower back)     Low back down left buttocks and leg 3. SEVERITY: How bad is the pain?  (e.g., Scale 1-10; mild, moderate, or severe)     Worsening and requesting flexeril  MD reported patient could take if sx returned 4. PATTERN: Is the pain constant? (e.g., yes, no; constant, intermittent)      Na  5. RADIATION: Does the pain shoot into your legs or somewhere else?     Yes left buttocks and leg 6. CAUSE:  What do you think is causing the back pain?      Not  sure  7. BACK OVERUSE:  Any recent lifting of heavy objects, strenuous work or exercise?     Na  8. MEDICINES: What have you taken so far for the pain? (e.g., nothing, acetaminophen , NSAIDS)     Toradol  injection  9. NEUROLOGIC SYMPTOMS: Do you have any weakness, numbness, or problems with bowel/bladder control?     Denies  10. OTHER SYMPTOMS: Do you have any other symptoms? (e.g., fever, abdomen pain, burning with urination, blood in urine)       Pain low back , left buttocks and leg 11. PREGNANCY: Is there any chance you are pregnant? When was your last menstrual period?       na  Protocols used: Back Pain-A-AH

## 2024-05-18 NOTE — Telephone Encounter (Signed)
 This is a 53 year old female who was recently evaluated in the clinic on 05/12/2024 for trochanteric bursitis of the left hip, she was given IM Toradol  30 mg injection.  Patient calls today reporting that initially in the first couple of days that she noted improvement in her left hip pain, however reports that the pain returned 2 days ago but is currently 8/10 worsened with mobility.  Reports that she is also currently taking gabapentin  300 mg 3 times daily, with minimal improvement.  -Will send Flexeril  5 mg as needed at bedtime for 1 month, counseled on sedation and dizziness.  -If the pain does not improve, will consider x-ray of the left hip to further assess the etiology

## 2024-05-20 NOTE — Progress Notes (Signed)
 Internal Medicine Clinic Attending  Case discussed with the resident at the time of the visit.  We reviewed the resident's history and exam and pertinent patient test results.  I agree with the assessment, diagnosis, and plan of care documented in the resident's note.

## 2024-05-23 ENCOUNTER — Other Ambulatory Visit (HOSPITAL_COMMUNITY): Payer: Self-pay

## 2024-05-23 ENCOUNTER — Encounter: Payer: Self-pay | Admitting: Podiatry

## 2024-05-23 ENCOUNTER — Ambulatory Visit (INDEPENDENT_AMBULATORY_CARE_PROVIDER_SITE_OTHER): Admitting: Podiatry

## 2024-05-23 ENCOUNTER — Telehealth: Payer: Self-pay | Admitting: Podiatry

## 2024-05-23 DIAGNOSIS — E1149 Type 2 diabetes mellitus with other diabetic neurological complication: Secondary | ICD-10-CM | POA: Diagnosis not present

## 2024-05-23 DIAGNOSIS — M19071 Primary osteoarthritis, right ankle and foot: Secondary | ICD-10-CM

## 2024-05-23 DIAGNOSIS — Q828 Other specified congenital malformations of skin: Secondary | ICD-10-CM | POA: Diagnosis not present

## 2024-05-23 MED ORDER — TRIAMCINOLONE ACETONIDE 10 MG/ML IJ SUSP
5.0000 mg | Freq: Once | INTRAMUSCULAR | Status: AC
  Administered 2024-05-23: 5 mg via INTRA_ARTICULAR

## 2024-05-23 MED ORDER — IBUPROFEN 800 MG PO TABS
800.0000 mg | ORAL_TABLET | Freq: Three times a day (TID) | ORAL | 1 refills | Status: DC | PRN
  Filled 2024-05-23: qty 30, 10d supply, fill #0
  Filled 2024-06-07: qty 30, 10d supply, fill #1

## 2024-05-23 NOTE — Patient Instructions (Signed)
 While at your visit today you received a steroid injection in your foot or ankle to help with your pain. Along with having the steroid medication there is some numbing medication in the shot that you received. Due to this you may notice some numbness to the area for the next couple of hours.   I would recommend limiting activity for the next few days to help the steroid injection take affect.    The actually benefit from the steroid injection may take up to 2-7 days to see a difference. You may actually experience a small (as in 10%) INCREASE in pain in the first 24 hours---that is common. It would be best if you can ice the area today and take anti-inflammatory medications (such as Ibuprofen , Motrin , or Aleve ) if you are able to take these medications. If you were prescribed another medication to help with the pain go ahead and start that medication today    Things to watch out for that you should contact us  or a health care provider urgently would include: 1. Unusual (as in more than 10%) increase in pain 2. New fever > 101.5 3. New swelling or redness of the injected area.  4. Streaking of red lines around the area injected.  If you have any questions or concerns about this, please give our office a call at [phone].    --  Diabetes Mellitus and Foot Care Diabetes, also called diabetes mellitus, may cause problems with your feet and legs because of poor blood flow (circulation). Poor circulation may make your skin: Become thinner and drier. Break more easily. Heal more slowly. Peel and crack. You may also have nerve damage (neuropathy). This can cause decreased feeling in your legs and feet. This means that you may not notice minor injuries to your feet that could lead to more serious problems. Finding and treating problems early is the best way to prevent future foot problems. How to care for your feet Foot hygiene  Wash your feet daily with warm water and mild soap. Do not use  hot water. Then, pat your feet and the areas between your toes until they are fully dry. Do not soak your feet. This can dry your skin. Trim your toenails straight across. Do not dig under them or around the cuticle. File the edges of your nails with an emery board or nail file. Apply a moisturizing lotion or petroleum jelly to the skin on your feet and to dry, brittle toenails. Use lotion that does not contain alcohol and is unscented. Do not apply lotion between your toes. Shoes and socks Wear clean socks or stockings every day. Make sure they are not too tight. Do not wear knee-high stockings. These may decrease blood flow to your legs. Wear shoes that fit well and have enough cushioning. Always look in your shoes before you put them on to be sure there are no objects inside. To break in new shoes, wear them for just a few hours a day. This prevents injuries on your feet. Wounds, scrapes, corns, and calluses  Check your feet daily for blisters, cuts, bruises, sores, and redness. If you cannot see the bottom of your feet, use a mirror or ask someone for help. Do not cut off corns or calluses or try to remove them with medicine. If you find a minor scrape, cut, or break in the skin on your feet, keep it and the skin around it clean and dry. You may clean these areas with mild soap  and water. Do not clean the area with peroxide, alcohol, or iodine. If you have a wound, scrape, corn, or callus on your foot, look at it several times a day to make sure it is healing and not infected. Check for: Redness, swelling, or pain. Fluid or blood. Warmth. Pus or a bad smell. General tips Do not cross your legs. This may decrease blood flow to your feet. Do not use heating pads or hot water bottles on your feet. They may burn your skin. If you have lost feeling in your feet or legs, you may not know this is happening until it is too late. Protect your feet from hot and cold by wearing shoes, such as at the  beach or on hot pavement. Schedule a complete foot exam at least once a year or more often if you have foot problems. Report any cuts, sores, or bruises to your health care provider right away. Where to find more information American Diabetes Association: diabetes.org Association of Diabetes Care & Education Specialists: diabeteseducator.org Contact a health care provider if: You have a condition that increases your risk of infection, and you have any cuts, sores, or bruises on your feet. You have an injury that is not healing. You have redness on your legs or feet. You feel burning or tingling in your legs or feet. You have pain or cramps in your legs and feet. Your legs or feet are numb. Your feet always feel cold. You have pain around any toenails. Get help right away if: You have a wound, scrape, corn, or callus on your foot and: You have signs of infection. You have a fever. You have a red line going up your leg. This information is not intended to replace advice given to you by your health care provider. Make sure you discuss any questions you have with your health care provider. Document Revised: 04/23/2022 Document Reviewed: 04/23/2022 Elsevier Patient Education  2024 [employer].

## 2024-05-23 NOTE — Progress Notes (Unsigned)
 Subjective: Chief Complaint  Patient presents with   Foot Pain    F/U right ankle arthritis and swelling , flatfoot; hyperkeratotic lesions. 7 pain. 0 improvements. IDDM A1C 70.1     53 year old female presents the office today for concerns of painful calluses on both of her feet.  She does not report any open lesions.    She states that she still having pain to the right ankle she had swelling which has been ongoing issue.  She takes ibuprofen  800 mg which does help some.  No recent injuries.   Last A1c was 6.1 on 05/12/2024  Lovie Clarity, MD Last seen: 05/12/2024  Objective: AAO x3, NAD DP/PT pulses palpable bilaterally, CRT less than 3 seconds Significant bunions are present bilateral with hammertoes deformities bilaterally.  Flatfoot is present bilaterally.   Hyperkeratotic lesion left submetatarsal 2 as well as fifth metatarsal head on the left foot.  Minimal callus on the medial aspect the left second toe.  There is no underlying ulceration drainage or any signs of infection.  There is no ulcerations. Mild diffuse discomfort of the ankle with mild chronic swelling, most notably to the lateral ankle. No area of pinpoint tenderness.   Previous triple arthrodesis noted. No pain with calf compression, swelling, warmth, erythema  Assessment: Right ankle arthritis, flatfoot; hyperkeratotic lesions  Plan: Right foot pain, arthritis/flatfoot - Refilled ibuprofen  but hold off on other anti-inflammatories.  Steroid injection warmed.  Verbal consent obtained.  I cleaned the skin with Betadine, alcohol.  Mixture 1 cc Kenalog  10, 0.5 cc of Marcaine plain, 0.5 cc of lidocaine  plain was infiltrated into the anterior lateral aspect of the ankle joint without complications.  Postinjection care discussed.  Tolerated well.  Preulcerative calluses -Sharply debrided hyperkeratotic lesions x 3 without any complications or bleeding  Donnice JONELLE Fees DPM

## 2024-05-23 NOTE — Telephone Encounter (Signed)
 Patient called stating she was supposed to have a prescription for ibuprofen  800 mg sent after today's  visit but the pharmacy still has not received anything as of yet. Patients preferred pharmacy is [facility]

## 2024-05-25 ENCOUNTER — Other Ambulatory Visit (HOSPITAL_COMMUNITY): Payer: Self-pay

## 2024-05-25 ENCOUNTER — Encounter (HOSPITAL_COMMUNITY): Payer: Self-pay | Admitting: Physician Assistant

## 2024-05-25 ENCOUNTER — Other Ambulatory Visit: Payer: Self-pay

## 2024-05-25 ENCOUNTER — Telehealth (HOSPITAL_COMMUNITY): Admitting: Physician Assistant

## 2024-05-25 DIAGNOSIS — F5105 Insomnia due to other mental disorder: Secondary | ICD-10-CM | POA: Diagnosis not present

## 2024-05-25 DIAGNOSIS — F99 Mental disorder, not otherwise specified: Secondary | ICD-10-CM

## 2024-05-25 DIAGNOSIS — Z79899 Other long term (current) drug therapy: Secondary | ICD-10-CM | POA: Diagnosis not present

## 2024-05-25 DIAGNOSIS — F411 Generalized anxiety disorder: Secondary | ICD-10-CM | POA: Diagnosis not present

## 2024-05-25 DIAGNOSIS — F3181 Bipolar II disorder: Secondary | ICD-10-CM | POA: Diagnosis not present

## 2024-05-25 MED ORDER — LAMOTRIGINE 100 MG PO TABS
100.0000 mg | ORAL_TABLET | Freq: Every day | ORAL | 1 refills | Status: DC
  Filled 2024-05-25 – 2024-06-06 (×2): qty 30, 30d supply, fill #0
  Filled 2024-07-06: qty 30, 30d supply, fill #1

## 2024-05-25 MED ORDER — LUMATEPERONE TOSYLATE 42 MG PO CAPS
42.0000 mg | ORAL_CAPSULE | Freq: Every day | ORAL | 1 refills | Status: DC
  Filled 2024-05-25: qty 30, 30d supply, fill #0
  Filled 2024-06-07 – 2024-06-17 (×2): qty 30, 30d supply, fill #1

## 2024-05-25 MED ORDER — MIRTAZAPINE 7.5 MG PO TABS
7.5000 mg | ORAL_TABLET | Freq: Every day | ORAL | 1 refills | Status: DC
  Filled 2024-05-25 – 2024-06-06 (×2): qty 30, 30d supply, fill #0
  Filled 2024-06-27 – 2024-07-06 (×2): qty 30, 30d supply, fill #1

## 2024-05-25 MED ORDER — GABAPENTIN 400 MG PO CAPS
400.0000 mg | ORAL_CAPSULE | Freq: Three times a day (TID) | ORAL | 1 refills | Status: DC
Start: 2024-05-25 — End: 2024-07-07
  Filled 2024-05-25 – 2024-06-27 (×4): qty 90, 30d supply, fill #0

## 2024-05-25 MED ORDER — BUSPIRONE HCL 30 MG PO TABS
30.0000 mg | ORAL_TABLET | Freq: Two times a day (BID) | ORAL | 1 refills | Status: DC
  Filled 2024-05-25 – 2024-06-06 (×2): qty 60, 30d supply, fill #0
  Filled 2024-06-27 – 2024-07-06 (×2): qty 60, 30d supply, fill #1

## 2024-05-25 NOTE — Progress Notes (Signed)
 [facility] MD/PA/NP OP Progress Note  Virtual Visit via Video Note  I connected with Tina [name] on 05/25/24 at 10:00 AM EDT by a video enabled telemedicine application and verified that I am speaking with the correct person using two identifiers.  Location: Patient: Home Provider: Clinic   I discussed the limitations of evaluation and management by telemedicine and the availability of in person appointments. The patient expressed understanding and agreed to proceed.  Follow Up Instructions:   I discussed the assessment and treatment plan with the patient. The patient was provided an opportunity to ask questions and all were answered. The patient agreed with the plan and demonstrated an understanding of the instructions.   The patient was advised to call back or seek an in-person evaluation if the symptoms worsen or if the condition fails to improve as anticipated.  I provided 20 minutes of non-face-to-face time during this encounter.  Tina FORBES Bolster, PA   05/25/2024 10:30 AM Tina [name]  MRN:  [id number]  Chief Complaint:  Chief Complaint  Patient presents with   Follow-up   Medication Refill   HPI:   Tina [name] is a 53 year old, African-American female with a past psychiatric history significant for bipolar 2 disorder emphases major depressive episode), generalized anxiety disorder, insomnia, and tobacco use disorder who presents to [facility] via virtual video visit for follow-up and medication management.  Patient is currently being managed on the following psychiatric medications:  Caplyta  42 mg daily (Samples) Mirtazapine  7.5 mg at bedtime Gabapentin  400 mg 3 times daily Buspirone  30 mg 2 times daily Lamotrigine  100 mg daily  Patient presents to the encounter stating that she is stressed due to taking care of her mother's personal effects.  She reports that her mother passed away 3 years ago.   Patient reports that she has been running around all over the place to get these errands done.  She reports that her use of her medications are going well.  Patient reports that she has not noticed a huge difference since the addition of Caplyta .  Patient endorses depression and rates her depression an 8 out of 10 with 10 being most severe.  Patient endorses depressive episodes every day.  Patient endorses the following depressive symptoms: feelings of sadness, irritability, lack of motivation, decreased energy, decreased concentration, and feelings of guilt/worthlessness.  Patient denies hopelessness.  Patient endorses anxiety and rates her anxiety an 8 or 9 out of 10.  Patient's current stressors include her brother recently taking care of her previous place of residence and having to move her stuff.  A PHQ-9 screen was performed with the patient scoring a 17.  A GAD-7 screen was also performed with the patient scoring a 19.  Patient is alert and oriented x 4, calm, cooperative, and fully engaged in conversation during the encounter.  Patient reports that she is in a funky mood.  Patient exhibits depressed mood with congruent affect.  Patient denies suicidal ideations.  She endorses homicidal ideations towards her brother but denies having a specific plan or intent.  Patient denies active auditory or visual hallucinations but states that she occasionally hears voices.  Patient endorses poor sleep and receives on average 2 hours of sleep per night.  Patient endorses fair appetite and eats on average 2 meals per day.  Patient denies alcohol consumption or illicit drug use.  Patient endorses tobacco use and smokes an average 1/2 pack/day.  Visit Diagnosis:    ICD-10-CM  1. Long term current use of antipsychotic medication  Z79.899     2. Bipolar 2 disorder, major depressive episode (HCC)  F31.81 lamoTRIgine  (LAMICTAL ) 100 MG tablet    lumateperone  tosylate (CAPLYTA ) 42 MG capsule    gabapentin   (NEURONTIN ) 400 MG capsule    3. Generalized anxiety disorder  F41.1 busPIRone  (BUSPAR ) 30 MG tablet    mirtazapine  (REMERON ) 7.5 MG tablet    gabapentin  (NEURONTIN ) 400 MG capsule    4. 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    Family Psychiatric History:  Niece - Schizophrenia  Family History:  Family History  Problem Relation Age of Onset   Colon cancer Sister    Diabetes Mother    Diabetes Father    Diabetes Brother    Celiac disease Paternal Aunt     Social History:  Social History   Socioeconomic History   Marital status: Single    Spouse name: Not on file   Number of children: 0   Years of education: 12   Highest education level: Some college, no degree  Occupational History   Occupation: TEFL teacher: UNEMPLOYED    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, academic  Tobacco Use   Smoking status: Every Day    Current packs/day: 0.50    Average packs/day: 0.5 packs/day for 23.0 years (11.5 ttl pk-yrs)    Types: Cigarettes   Smokeless tobacco: Never   Tobacco comments:    1/2PPD  Vaping Use   Vaping status: Never Used  Substance and Sexual Activity   Alcohol use: Yes    Alcohol/week: 0.0 standard drinks of alcohol    Comment: occ   Drug use: No   Sexual activity: Yes    Partners: Female    Birth control/protection: Injection    Comment: Depo  Other Topics Concern   Not on file  Social History  Narrative   Homosexual; in a monogamous relationship w/ homosexual woman.   She is disabled.  She last worked in 2018 at [employer].    Right handed   One story home   Caffeine: 1 soda daily, occas drinks coffee    Social Drivers of Corporate investment banker Strain: Low Risk  (04/20/2024)   Overall Financial Resource Strain (CARDIA)    Difficulty of Paying Living Expenses: Not very hard  Food Insecurity: Food Insecurity Present (04/20/2024)   Hunger Vital Sign    Worried About Running Out of Food in the Last Year: Often true    Ran Out of Food in the Last Year: Often true  Transportation Needs: No Transportation Needs (04/20/2024)   PRAPARE - Administrator, Civil Service (Medical): No    Lack of Transportation (Non-Medical): No  Physical Activity: Sufficiently Active (04/20/2024)   Exercise  Vital Sign    Days of Exercise per Week: 6 days    Minutes of Exercise per Session: 30 min  Recent Concern: Physical Activity - Insufficiently Active (02/03/2024)   Exercise Vital Sign    Days of Exercise per Week: 1 day    Minutes of Exercise per Session: 10 min  Stress: Stress Concern Present (04/20/2024)   [employer] of Occupational Health - Occupational Stress Questionnaire    Feeling of Stress: To some extent  Social Connections: Moderately Isolated (04/20/2024)   Social Connection and Isolation Panel    Frequency of Communication with Friends and Family: Three times a week    Frequency of Social Gatherings with Friends and Family: Once a week    Attends Religious Services: 1 to 4 times per year    Active Member of [employer] or Organizations: No    Attends Banker Meetings: Never    Marital Status: Never married    Allergies:  Allergies  Allergen Reactions   Codeine  Other (See Comments)    hallucinations   Sglt2 Inhibitors     Recurrent yeast infections on Jardiance , stopped 06/2022    Metabolic Disorder Labs: Lab Results  Component Value Date   HGBA1C 6.1  (A) 05/12/2024   MPG 200 05/25/2017   MPG 237 02/26/2016   No results found for: PROLACTIN Lab Results  Component Value Date   CHOL 151 08/12/2023   TRIG 149 08/12/2023   HDL 32 (L) 08/12/2023   CHOLHDL 4.7 (H) 08/12/2023   VLDL 50 (H) 08/21/2014   LDLCALC 93 08/12/2023   LDLCALC 97 03/11/2022   Lab Results  Component Value Date   TSH 1.074 06/27/2011    Therapeutic Level Labs: No results found for: LITHIUM No results found for: VALPROATE No results found for: CBMZ  Current Medications: Current Outpatient Medications  Medication Sig Dispense Refill   amLODipine -olmesartan  (AZOR ) 5-20 MG tablet Take 1 tablet by mouth daily. 90 tablet 3   atorvastatin  (LIPITOR) 40 MG tablet Take 1 tablet (40 mg total) by mouth daily. 100 tablet 2   busPIRone  (BUSPAR ) 30 MG tablet Take 1 tablet (30 mg total) by mouth 2 (two) times daily. 60 tablet 1   celecoxib  (CELEBREX ) 100 MG capsule Take 1 capsule (100 mg total) by mouth 2 (two) times daily. 30 capsule 1   Continuous Glucose Sensor (FREESTYLE LIBRE 3 PLUS SENSOR) MISC Change sensor every 15 days. 2 each 3   cyclobenzaprine  (FLEXERIL ) 5 MG tablet Take 1 tablet (5 mg total) by mouth at bedtime as needed for muscle spasms. 30 tablet 0   gabapentin  (NEURONTIN ) 400 MG capsule Take 1 capsule (400 mg total) by mouth 3 (three) times daily. 90 capsule 1   ibuprofen  (ADVIL ) 800 MG tablet Take 1 tablet (800 mg total) by mouth every 8 (eight) hours as needed. 30 tablet 1   Insulin  Lispro Prot & Lispro (HUMALOG  MIX 75/25 KWIKPEN) (75-25) 100 UNIT/ML Kwikpen Inject 30 Units into the skin 2 (two) times daily before a meal. 18 mL 2   lamoTRIgine  (LAMICTAL ) 100 MG tablet Take 1 tablet (100 mg total) by mouth daily. 30 tablet 1   lumateperone  tosylate (CAPLYTA ) 42 MG capsule Take 1 capsule (42 mg total) by mouth daily. 30 capsule 1   metFORMIN  (GLUCOPHAGE ) 1000 MG tablet Take 1 tablet (1,000 mg total) by mouth 2 (two) times daily with a meal. 60 tablet  3   mirtazapine  (REMERON ) 7.5 MG tablet Take 1 tablet (7.5 mg total) by mouth  at bedtime. 30 tablet 1   nicotine  polacrilex (NICORETTE ) 4 MG gum Chew 2 pieces (8 mg total) by mouth as needed for smoking cessation. 110 tablet 2   QUEtiapine  (SEROQUEL ) 25 MG tablet Take 4 tablets (100 mg total) by mouth at bedtime for 7 days, THEN 2 tablets (50 mg total) at bedtime for 7 days, THEN 1 tablet (25 mg total) at bedtime for 7 days. 49 tablet 0   Semaglutide , 2 MG/DOSE, (OZEMPIC , 2 MG/DOSE,) 8 MG/3ML SOPN Inject 2 mg into the skin once a week. 3 mL 3   No current facility-administered medications for this visit.     Musculoskeletal: Strength & Muscle Tone: Unable to assess due to telemedicine visit Gait & Station: Unable to assess due to telemedicine visit Patient leans: Unable to assess due to telemedicine visit  Psychiatric Specialty Exam: Review of Systems  Psychiatric/Behavioral:  Positive for dysphoric mood, hallucinations and sleep disturbance. Negative for decreased concentration, self-injury and suicidal ideas. The patient is nervous/anxious. The patient is not hyperactive.     There were no vitals taken for this visit.There is no height or weight on file to calculate BMI.  General Appearance: Casual  Eye Contact:  Good  Speech:  Clear and Coherent and Normal Rate  Volume:  Normal  Mood:  Anxious and Depressed  Affect:  Congruent  Thought Process:  Coherent, Goal Directed, and Descriptions of Associations: Intact  Orientation:  Full (Time, Place, and Person)  Thought Content: Hallucinations: Auditory   Suicidal Thoughts:  No  Homicidal Thoughts:  No  Memory:  Immediate;   Good Recent;   Good Remote;   Good  Judgement:  Good  Insight:  Good  Psychomotor Activity:  Normal  Concentration:  Concentration: Good and Attention Span: Good  Recall:  Good  Fund of Knowledge: Good  Language: Good  Akathisia:  No  Handed:  Right  AIMS (if indicated): done; 4  Assets:  Communication  Skills Desire for Improvement Financial Resources/Insurance Housing Social Support  ADL's:  Intact  Cognition: WNL  Sleep:  Poor   Screenings: AIMS    Flowsheet Row Video Visit from 05/25/2024 in [facility] Video Visit from 04/06/2024 in [facility] Video Visit from 03/09/2024 in [facility]  AIMS Total Score 9 4 4    GAD-7    Flowsheet Row Video Visit from 05/25/2024 in [facility] Video Visit from 04/06/2024 in [facility] Video Visit from 03/09/2024 in [facility] Video Visit from 01/27/2024 in [facility] Video Visit from 12/16/2023 in [facility]  Total GAD-7 Score 19 16 17 16 18    PHQ2-9    Flowsheet Row [facility] Visit [facility]25[facility] Office Visit from 05/12/2024 in [facility] [place] Clinical Support from 04/20/2024 in [facility] [place] Video Visit from 04/06/2024 in [facility] Video Visit from 03/09/2024 in [facility]  PHQ-2 Total Score 5 2 2 5 4   PHQ-9 Total Score 17 3 3 15 13    Flowsheet Row Video Visit from 05/25/2024 in [facility] Video Visit from 04/06/2024 in [facility] Video Visit from 03/09/2024 in [facility]  C-SSRS RISK CATEGORY Moderate  Risk Moderate Risk Moderate Risk     Assessment and Plan:   Bruna Dills is a 53 year old, African-American female with a past psychiatric history significant for bipolar 2 disorder (major depressive episode), generalized anxiety disorder, insomnia, and tobacco use disorder who presents to [facility] via virtual video visit for follow-up and medication management.  An aims assessment was performed with the patient scoring a 9.  Patient endorses moderate movements in her upper and lower extremities and her neck, shoulders, and hips.  There is a possibility that patient's involuntary movements may be attributed to spasms from the pain that she is currently in.  Patient would like to hold off on medication management for her movements at this time.  Patient reports that she continues to take her medications regularly.  Since the addition of Caplyta , patient has not noticed a significant difference in her mood.  She still continues to experience depressive symptoms attributed to stressors in her life such as taking care of her mother's personal affects and having to move out of her previous place of residence.  Patient continues to endorse elevated anxiety regarding the same stressors.  A PHQ-9 screen was performed with the patient scoring a 17.  A GAD-7 screen was also performed with the patient scoring a 19.  Patient also notes that she occasionally experiences auditory hallucinations.  Despite her ongoing depression, anxiety, and auditory hallucinations, patient would like to continue taking her medications as prescribed.  Patient has only been on Caplyta  for a few weeks and would like to give the medication more time to experience its therapeutic effects.  Patient to continue taking her medications as prescribed.  Patient's medications to be e-prescribed to pharmacy of choice.  Patient's labs pending.  Provider referred patient to cardiology for EKG.  Patient vocalized understanding.   A [place] Suicide Severity Rating Scale was performed with the patient being considered moderate risk.  Patient denies suicidal ideations and is able to contract for safety at this time.  Safety planning was discussed with the patient prior to the conclusion of the encounter.  - Patient was instructed  to contact 911 in the event of a mental health crisis. - Patient was instructed to contact 988 Suicide and Crisis Lifeline in the event of a mental health crisis. - Patient was instructed to present to [facility] in the event of a mental health crisis.  Collaboration of Care: Collaboration of Care: Medication Management AEB provider managing patient's psychiatric medications, Primary Care Provider AEB patient being seen by primary care provider at [facility], Psychiatrist AEB patient being seen by a mental health provider at this facility, Other provider involved in patient's care AEB patient being seen by Podiatry, and Referral or follow-up with counselor/therapist AEB patient being seen by a licensed clinical social worker at this facility  Patient/Guardian was advised Release of Information must be obtained prior to any record release in order to collaborate their care with an outside provider. Patient/Guardian was advised if they have not already done so to contact the registration department to sign all necessary forms in order for us  to release information regarding their care.   Consent: Patient/Guardian gives verbal consent for treatment and assignment of benefits for services provided during this visit. Patient/Guardian expressed understanding and agreed to proceed.   1. Bipolar 2 disorder, major depressive episode (HCC)  - lamoTRIgine  (LAMICTAL ) 100 MG tablet; Take 1 tablet (100 mg total) by  mouth daily.  Dispense: 30 tablet; Refill: 1 - lumateperone  tosylate (CAPLYTA ) 42 MG capsule; Take 1 capsule (42 mg total) by mouth daily.  Dispense: 30 capsule; Refill: 1 - gabapentin  (NEURONTIN ) 400 MG capsule; Take 1 capsule (400 mg total) by mouth 3 (three) times daily.  Dispense: 90 capsule; Refill: 1  2. Generalized anxiety disorder  - busPIRone  (BUSPAR ) 30 MG tablet; Take 1 tablet (30 mg total) by mouth 2 (two) times daily.  Dispense:  60 tablet; Refill: 1 - mirtazapine  (REMERON ) 7.5 MG tablet; Take 1 tablet (7.5 mg total) by mouth at bedtime.  Dispense: 30 tablet; Refill: 1 - gabapentin  (NEURONTIN ) 400 MG capsule; Take 1 capsule (400 mg total) by mouth 3 (three) times daily.  Dispense: 90 capsule; Refill: 1  3. Long term current use of antipsychotic medication (Primary) Pending labs Referral to EKG  4. Insomnia due to other mental disorder  Patient to follow-up in 6 weeks Provider spent a total of 20 minutes with the patient/reviewing patient's chart  Tina FORBES Bolster, PA 05/25/2024, 10:30 AM

## 2024-05-27 ENCOUNTER — Ambulatory Visit (HOSPITAL_COMMUNITY): Admitting: Clinical

## 2024-05-27 ENCOUNTER — Encounter (HOSPITAL_COMMUNITY): Payer: Self-pay

## 2024-06-06 ENCOUNTER — Other Ambulatory Visit (HOSPITAL_COMMUNITY): Payer: Self-pay

## 2024-06-06 ENCOUNTER — Other Ambulatory Visit: Payer: Self-pay

## 2024-06-07 ENCOUNTER — Other Ambulatory Visit (HOSPITAL_COMMUNITY): Payer: Self-pay

## 2024-06-09 ENCOUNTER — Other Ambulatory Visit (HOSPITAL_COMMUNITY): Payer: Self-pay

## 2024-06-16 ENCOUNTER — Ambulatory Visit (INDEPENDENT_AMBULATORY_CARE_PROVIDER_SITE_OTHER): Admitting: Clinical

## 2024-06-16 DIAGNOSIS — F3181 Bipolar II disorder: Secondary | ICD-10-CM
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
## 2024-06-17 ENCOUNTER — Other Ambulatory Visit: Payer: Self-pay

## 2024-06-20 ENCOUNTER — Other Ambulatory Visit (HOSPITAL_COMMUNITY): Payer: Self-pay

## 2024-06-27 ENCOUNTER — Other Ambulatory Visit: Payer: Self-pay

## 2024-06-27 ENCOUNTER — Other Ambulatory Visit (HOSPITAL_COMMUNITY): Payer: Self-pay

## 2024-06-27 ENCOUNTER — Encounter (HOSPITAL_COMMUNITY): Payer: Self-pay

## 2024-06-27 DIAGNOSIS — E1165 Type 2 diabetes mellitus with hyperglycemia: Secondary | ICD-10-CM

## 2024-06-27 MED ORDER — OZEMPIC (2 MG/DOSE) 8 MG/3ML ~~LOC~~ SOPN
2.0000 mg | PEN_INJECTOR | SUBCUTANEOUS | 3 refills | Status: DC
  Filled 2024-06-27: qty 3, 28d supply, fill #0
  Filled 2024-08-04: qty 3, 28d supply, fill #1
  Filled 2024-09-04: qty 3, 28d supply, fill #2
  Filled 2024-10-01: qty 3, 28d supply, fill #3

## 2024-06-27 NOTE — Telephone Encounter (Signed)
 Copied from CRM [id number]. Topic: Clinical - Medication Refill >> Jun 27, 2024  9:25 AM Hamdi H wrote: Medication: Semaglutide , 2 MG/DOSE, (OZEMPIC , 2 MG/DOSE,) 8 MG/3ML SOPN  Has the patient contacted their pharmacy? Yes (Agent: If no, request that the patient contact the pharmacy for the refill. If patient does not wish to contact the pharmacy document the reason why and proceed with request.) (Agent: If yes, when and what did the pharmacy advise?) They need new refills.   This is the patient's preferred pharmacy:  [facility] [address], [address] [place] [address] Phone: [phone] Fax: [phone]  Is this the correct pharmacy for this prescription? Yes If no, delete pharmacy and type the correct one.   Has the prescription been filled recently? No  Is the patient out of the medication? No  Has the patient been seen for an appointment in the last year OR does the patient have an upcoming appointment? Yes  Can we respond through MyChart? Yes  Agent: Please be advised that Rx refills may take up to 3 business days. We ask that you follow-up with your pharmacy.

## 2024-06-28 ENCOUNTER — Other Ambulatory Visit: Payer: Self-pay

## 2024-06-28 NOTE — Telephone Encounter (Signed)
 Will forward message to HME per message below  Copied from CRM #[id number]. Topic: Appointments - Scheduling Inquiry for Clinic >> Jun 28, 2024 10:29 AM Miquel SAILOR wrote: Reason for CRM: Patient calling in on when she will receive her next depo shot. Needs call back from the nurse.called office instructed by Mae to send message and let PT know her Ozempic  has been approved. Went back to line but PT disconnected.If PT calls back let her know info [phone]

## 2024-06-28 NOTE — Telephone Encounter (Signed)
 Message sent to   Copied from CRM #[id number]. Topic: Appointments - Scheduling Inquiry for Clinic >> Jun 28, 2024 10:34 AM Mercer PEDLAR wrote: Reason for CRM: Patient would like callback to schedule next Depo shot.

## 2024-07-01 ENCOUNTER — Other Ambulatory Visit (HOSPITAL_COMMUNITY): Payer: Self-pay

## 2024-07-05 ENCOUNTER — Other Ambulatory Visit (HOSPITAL_COMMUNITY): Payer: Self-pay

## 2024-07-07 ENCOUNTER — Other Ambulatory Visit (HOSPITAL_COMMUNITY): Payer: Self-pay

## 2024-07-07 ENCOUNTER — Telehealth (HOSPITAL_COMMUNITY): Admitting: Physician Assistant

## 2024-07-07 ENCOUNTER — Encounter (HOSPITAL_COMMUNITY): Payer: Self-pay | Admitting: Physician Assistant

## 2024-07-07 DIAGNOSIS — F5105 Insomnia due to other mental disorder: Secondary | ICD-10-CM | POA: Diagnosis not present

## 2024-07-07 DIAGNOSIS — Z79899 Other long term (current) drug therapy: Secondary | ICD-10-CM | POA: Diagnosis not present

## 2024-07-07 DIAGNOSIS — F411 Generalized anxiety disorder: Secondary | ICD-10-CM | POA: Diagnosis not present

## 2024-07-07 DIAGNOSIS — F3181 Bipolar II disorder: Secondary | ICD-10-CM

## 2024-07-07 DIAGNOSIS — F99 Mental disorder, not otherwise specified: Secondary | ICD-10-CM

## 2024-07-07 MED ORDER — LUMATEPERONE TOSYLATE 42 MG PO CAPS
42.0000 mg | ORAL_CAPSULE | Freq: Every day | ORAL | 1 refills | Status: DC
Start: 2024-07-07 — End: 2024-09-04
  Filled 2024-07-07 – 2024-07-11 (×2): qty 30, 30d supply, fill #0
  Filled 2024-08-04: qty 30, 30d supply, fill #1

## 2024-07-07 MED ORDER — GABAPENTIN 400 MG PO CAPS
400.0000 mg | ORAL_CAPSULE | Freq: Three times a day (TID) | ORAL | 1 refills | Status: DC
  Filled 2024-07-07 – 2024-08-04 (×2): qty 90, 30d supply, fill #0
  Filled 2024-09-04: qty 90, 30d supply, fill #1

## 2024-07-07 MED ORDER — LAMOTRIGINE 100 MG PO TABS
100.0000 mg | ORAL_TABLET | Freq: Every day | ORAL | 1 refills | Status: DC
  Filled 2024-08-04: qty 30, 30d supply, fill #0
  Filled 2024-09-04: qty 30, 30d supply, fill #1

## 2024-07-07 MED ORDER — MIRTAZAPINE 7.5 MG PO TABS
7.5000 mg | ORAL_TABLET | Freq: Every day | ORAL | 1 refills | Status: DC
  Filled 2024-08-04: qty 30, 30d supply, fill #0
  Filled 2024-09-04: qty 30, 30d supply, fill #1

## 2024-07-07 MED ORDER — BUSPIRONE HCL 30 MG PO TABS
30.0000 mg | ORAL_TABLET | Freq: Two times a day (BID) | ORAL | 1 refills | Status: DC
  Filled 2024-08-04: qty 60, 30d supply, fill #0
  Filled 2024-09-04: qty 60, 30d supply, fill #1

## 2024-07-07 NOTE — Progress Notes (Signed)
 BH MD/PA/NP OP Progress Note  Virtual Visit via Video Note  I connected with Tina [name] on 07/07/24 at 10:30 AM EDT by a video enabled telemedicine application and verified that I am speaking with the correct person using two identifiers.  Location: Patient: Home Provider: Clinic   I discussed the limitations of evaluation and management by telemedicine and the availability of in person appointments. The patient expressed understanding and agreed to proceed.  Follow Up Instructions:   I discussed the assessment and treatment plan with the patient. The patient was provided an opportunity to ask questions and all were answered. The patient agreed with the plan and demonstrated an understanding of the instructions.   The patient was advised to call back or seek an in-person evaluation if the symptoms worsen or if the condition fails to improve as anticipated.  I provided 22 minutes of non-face-to-face time during this encounter.  Tina FORBES Bolster, PA   07/07/2024 10:30 AM Tina [name]  MRN:  [id number]  Chief Complaint:  Chief Complaint  Patient presents with   Follow-up   Medication Refill   HPI:   Tina [name] is a 53 year old, African-American female with a past psychiatric history significant for bipolar 2 disorder emphases major depressive episode), generalized anxiety disorder, insomnia, and tobacco use disorder who presents to [facility] via virtual video visit for follow-up and medication management.  Patient is currently being managed on the following psychiatric medications:  Caplyta  42 mg daily Mirtazapine  7.5 mg at bedtime Gabapentin  400 mg 3 times daily Buspirone  30 mg 2 times daily Lamotrigine  100 mg daily  Patient presents to the encounter stating that she has not noticed any changes in her sleep since the addition of mirtazapine .  Patient believes that her sleep is mostly affected  by the amount of pain that she is in.  She reports that she was recently placed on Celebrex  for the management of her pain but is unsure if the medication is working.  Patient reports that she is not in a good mood today.  She reports that her use of Caplyta  seems to work some days and not so much on others.  She continues to endorse depression and rates her depression an 8 out of 10 with 10 being most severe.  Patient endorses depressive episodes 3 to 4 days/week.  Patient endorses the following depressive symptoms: feelings of sadness, lack of motivation, decreased concentration, decreased energy, and irritability.  Patient denies feelings of guilt/worthlessness or hopelessness.  Patient's depression is attributed to dealing with her deceased mother's estate and issues between her and her brother.  Patient also endorses anxiety stating that it is often difficult for her to breathe when getting upset.  She also reports that she experiences heart palpitations during elevated anxiety.  She reports that she tries to incorporate breathing exercises to limit her anxiety.  Patient reports that she continues to also hear voices that are negative in nature telling her to kill herself or that she is ugly and no one wants her.  Patient reports that she is not hearing them as much since the addition of Caplyta .  A PHQ-9 screening was performed with the patient scoring a 16.  A GAD-7 screen was also performed with the patient scoring a 16.  Patient is alert and oriented x 4, calm, cooperative, and fully engaged in conversation during the encounter.  Patient endorses low mood.  Patient exhibits depressed mood with congruent affect.  Patient denies  suicidal or homicidal ideations.  She further denies auditory or visual hallucinations and does not appear to be responding to internal/external stimuli.  Patient endorses poor sleep and receives on average 2 hours of sleep per night.  Patient endorses fair appetite and eats on  average 2 meals per day.  Patient denies alcohol consumption or illicit drug use.  Patient endorses tobacco use and smokes on average a half pack per day.  Visit Diagnosis:    ICD-10-CM   1. Bipolar 2 disorder, major depressive episode (HCC)  F31.81 lamoTRIgine  (LAMICTAL ) 100 MG tablet    gabapentin  (NEURONTIN ) 400 MG capsule    lumateperone  tosylate (CAPLYTA ) 42 MG capsule    2. Generalized anxiety disorder  F41.1 busPIRone  (BUSPAR ) 30 MG tablet    gabapentin  (NEURONTIN ) 400 MG capsule    mirtazapine  (REMERON ) 7.5 MG tablet      Past Psychiatric History:  Insomnia Tobacco use Anxiety Depression Biplor 2 disorder  Past Medical History:  Past Medical History:  Diagnosis Date   Diabetes mellitus    type II   Fibroids    HTN (hypertension)    Hyperlipidemia    Iron deficiency anemia    Left acetabular fracture (HCC)    Lumbar strain    Obesity    Ovarian cyst     Past Surgical History:  Procedure Laterality Date   FOOT ARTHRODESIS, MODIFIED MCBRIDE     right foot bunion surgery and ankle surgery 1980s   ORIF ACETABULAR FRACTURE     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    Family Psychiatric History:  Niece - Schizophrenia  Family History:  Family History  Problem Relation Age of Onset   Colon cancer Sister    Diabetes Mother    Diabetes Father    Diabetes Brother    Celiac disease Paternal Aunt     Social History:  Social History   Socioeconomic History   Marital status: Single    Spouse name: Not on file   Number of children: 0   Years of education: 12   Highest education level: Some college, no degree  Occupational History   Occupation: TEFL teacher: UNEMPLOYED    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, academic  Tobacco Use   Smoking status: Every Day    Current packs/day: 0.50    Average packs/day: 0.5 packs/day for 23.0 years (11.5 ttl pk-yrs)    Types: Cigarettes   Smokeless tobacco: Never   Tobacco comments:    1/2PPD  Vaping Use   Vaping status: Never Used   Substance and Sexual Activity   Alcohol use: Yes    Alcohol/week: 0.0 standard drinks of alcohol    Comment: occ   Drug use: No   Sexual activity: Yes    Partners: Female    Birth control/protection: Injection    Comment: Depo  Other Topics Concern   Not on file  Social History Narrative   Homosexual; in a monogamous relationship w/ homosexual woman.   She is disabled.  She last worked in 2018 at [employer].    Right handed   One story home   Caffeine: 1 soda daily, occas drinks coffee    Social Drivers of Corporate investment banker Strain: Low Risk  (04/20/2024)   Overall Financial Resource Strain (CARDIA)    Difficulty of Paying Living Expenses: Not very hard  Food Insecurity: Food Insecurity Present (04/20/2024)   Hunger Vital Sign    Worried About Running Out of Food in the Last Year: Often  true    Ran Out of Food in the Last Year: Often true  Transportation Needs: No Transportation Needs (04/20/2024)   PRAPARE - Administrator, Civil Service (Medical): No    Lack of Transportation (Non-Medical): No  Physical Activity: Sufficiently Active (04/20/2024)   Exercise Vital Sign    Days of Exercise per Week: 6 days    Minutes of Exercise per Session: 30 min  Recent Concern: Physical Activity - Insufficiently Active (02/03/2024)   Exercise Vital Sign    Days of Exercise per Week: 1 day    Minutes of Exercise per Session: 10 min  Stress: Stress Concern Present (04/20/2024)   [employer] of Occupational Health - Occupational Stress Questionnaire    Feeling of Stress: To some extent  Social Connections: Moderately Isolated (04/20/2024)   Social Connection and Isolation Panel    Frequency of Communication with Friends and Family: Three times a week    Frequency of Social Gatherings with Friends and Family: Once a week    Attends Religious Services: 1 to 4 times per year    Active Member of [employer] or Organizations: No    Attends Banker Meetings: Never     Marital Status: Never married    Allergies:  Allergies  Allergen Reactions   Codeine  Other (See Comments)    hallucinations   Sglt2 Inhibitors     Recurrent yeast infections on Jardiance , stopped 06/2022    Metabolic Disorder Labs: Lab Results  Component Value Date   HGBA1C 6.1 (A) 05/12/2024   MPG 200 05/25/2017   MPG 237 02/26/2016   No results found for: PROLACTIN Lab Results  Component Value Date   CHOL 151 08/12/2023   TRIG 149 08/12/2023   HDL 32 (L) 08/12/2023   CHOLHDL 4.7 (H) 08/12/2023   VLDL 50 (H) 08/21/2014   LDLCALC 93 08/12/2023   LDLCALC 97 03/11/2022   Lab Results  Component Value Date   TSH 1.074 06/27/2011    Therapeutic Level Labs: No results found for: LITHIUM No results found for: VALPROATE No results found for: CBMZ  Current Medications: Current Outpatient Medications  Medication Sig Dispense Refill   amLODipine -olmesartan  (AZOR ) 5-20 MG tablet Take 1 tablet by mouth daily. 90 tablet 3   atorvastatin  (LIPITOR) 40 MG tablet Take 1 tablet (40 mg total) by mouth daily. 100 tablet 2   busPIRone  (BUSPAR ) 30 MG tablet Take 1 tablet (30 mg total) by mouth 2 (two) times daily. 60 tablet 1   celecoxib  (CELEBREX ) 100 MG capsule Take 1 capsule (100 mg total) by mouth 2 (two) times daily. 30 capsule 1   Continuous Glucose Sensor (FREESTYLE LIBRE 3 PLUS SENSOR) MISC Change sensor every 15 days. 2 each 3   cyclobenzaprine  (FLEXERIL ) 5 MG tablet Take 1 tablet (5 mg total) by mouth at bedtime as needed for muscle spasms. 30 tablet 0   gabapentin  (NEURONTIN ) 400 MG capsule Take 1 capsule (400 mg total) by mouth 3 (three) times daily. 90 capsule 1   ibuprofen  (ADVIL ) 800 MG tablet Take 1 tablet (800 mg total) by mouth every 8 (eight) hours as needed. 30 tablet 1   Insulin  Lispro Prot & Lispro (HUMALOG  MIX 75/25 KWIKPEN) (75-25) 100 UNIT/ML Kwikpen Inject 30 Units into the skin 2 (two) times daily before a meal. 18 mL 2   lamoTRIgine  (LAMICTAL ) 100 MG  tablet Take 1 tablet (100 mg total) by mouth daily. 30 tablet 1   lumateperone  tosylate (CAPLYTA ) 42 MG capsule Take  1 capsule (42 mg total) by mouth daily. 30 capsule 1   metFORMIN  (GLUCOPHAGE ) 1000 MG tablet Take 1 tablet (1,000 mg total) by mouth 2 (two) times daily with a meal. 60 tablet 3   mirtazapine  (REMERON ) 7.5 MG tablet Take 1 tablet (7.5 mg total) by mouth at bedtime. 30 tablet 1   nicotine  polacrilex (NICORETTE ) 4 MG gum Chew 2 pieces (8 mg total) by mouth as needed for smoking cessation. 110 tablet 2   QUEtiapine  (SEROQUEL ) 25 MG tablet Take 4 tablets (100 mg total) by mouth at bedtime for 7 days, THEN 2 tablets (50 mg total) at bedtime for 7 days, THEN 1 tablet (25 mg total) at bedtime for 7 days. 49 tablet 0   Semaglutide , 2 MG/DOSE, (OZEMPIC , 2 MG/DOSE,) 8 MG/3ML SOPN Inject 2 mg into the skin once a week. 3 mL 3   No current facility-administered medications for this visit.     Musculoskeletal: Strength & Muscle Tone: Unable to assess due to telemedicine visit Gait & Station: Unable to assess due to telemedicine visit Patient leans: Unable to assess due to telemedicine visit  Psychiatric Specialty Exam: Review of Systems  Psychiatric/Behavioral:  Positive for dysphoric mood, hallucinations and sleep disturbance. Negative for decreased concentration, self-injury and suicidal ideas. The patient is nervous/anxious. The patient is not hyperactive.     There were no vitals taken for this visit.There is no height or weight on file to calculate BMI.  General Appearance: Casual  Eye Contact:  Good  Speech:  Clear and Coherent and Normal Rate  Volume:  Normal  Mood:  Anxious and Depressed  Affect:  Congruent  Thought Process:  Coherent, Goal Directed, and Descriptions of Associations: Intact  Orientation:  Full (Time, Place, and Person)  Thought Content: Hallucinations: Auditory   Suicidal Thoughts:  No  Homicidal Thoughts:  No  Memory:  Immediate;   Good Recent;    Good Remote;   Good  Judgement:  Good  Insight:  Good  Psychomotor Activity:  Normal  Concentration:  Concentration: Good and Attention Span: Good  Recall:  Good  Fund of Knowledge: Good  Language: Good  Akathisia:  No  Handed:  Right  AIMS (if indicated): done; 4  Assets:  Communication Skills Desire for Improvement Financial Resources/Insurance Housing Social Support  ADL's:  Intact  Cognition: WNL  Sleep:  Poor   Screenings: AIMS    Flowsheet Row Video Visit from 07/07/2024 in [facility] Video Visit from 05/25/2024 in [facility] Video Visit from 04/06/2024 in [facility] Video Visit from 03/09/2024 in [facility]  AIMS Total Score 0 9 4 4    GAD-7    Flowsheet Row Video Visit from 07/07/2024 in [facility] Video Visit from 05/25/2024 in [facility] Video Visit from 04/06/2024 in [facility] Video Visit from 03/09/2024 in [facility] Video Visit from 01/27/2024 in [facility]  Total GAD-7 Score 16 19 16 17 16    PHQ2-9    Flowsheet Row Video Visit from 07/07/2024 in [facility] Video Visit from 05/25/2024 in [facility] Office Visit from 05/12/2024 in [facility] [place] Clinical Support from 04/20/2024 in [facility] [place]. [facility] Video Visit from 04/06/2024 in  [facility]  PHQ-2 Total Score 5 5 2 2 5   PHQ-9 Total Score 16 17 3 3 15    Flowsheet Row Video Visit from 07/07/2024 in [facility] Video Visit from 05/25/2024 in [facility] Video Visit from 04/06/2024 in [facility]   C-SSRS RISK CATEGORY Moderate Risk Moderate Risk Moderate Risk     Assessment and Plan:   Quanna Wittke is a 53 year old, African-American female with a past psychiatric history significant for bipolar 2 disorder (major depressive episode), generalized anxiety disorder, insomnia, and tobacco use disorder who presents to [facility] via virtual video visit for follow-up and medication management.  Patient presents to the encounter stating that she continues to take her medications regularly and denies experiencing any adverse side effects.  An aims assessment was performed with the patient scoring a 0.  Since the addition of mirtazapine , patient reports that she has not experienced any improvement in her sleep.  She does inform provider that she believes that her inability to sleep is attributed to the amount of pain that she is in.  Patient is currently on Celebrex  for the management of her pain however does not seem to be effective in managing her pain.  Patient continues to endorse ongoing depression attributed to stressors related to managing her deceased mother's estate and ongoing issues between her and her brother.  Patient also endorses anxiety stating that she occasionally experiences difficulty breathing and palpitations when experiencing elevated anxiety.  A PHQ-9 screen was performed and the patient scoring a 16.  A GAD-7 screen was also performed with the patient scoring a 16.  Patient also informed provider that she continues to hear voices that are negative in nature.  Since the addition of Caplyta , patient reports that her voices have not been as present.  Patient informed provider that she would like to continue taking her medications as prescribed and denies the need for dosage adjustments at this time.  A [place] Suicide Severity Rating Scale was performed with the patient being considered moderate risk.  Patient denies suicidal  ideations and is able to contract for safety at this time.  Safety planning was discussed with the patient prior to the conclusion of the encounter.  - Patient was instructed to contact 911 in the event of a mental health crisis. - Patient was instructed to contact 988 Suicide and Crisis Lifeline in the event of a mental health crisis. - Patient was instructed to present to [facility] in the event of a mental health crisis.  Collaboration of Care: Collaboration of Care: Medication Management AEB provider managing patient's psychiatric medications, Primary Care Provider AEB patient being seen by primary care provider at [facility], Psychiatrist AEB patient being seen by a mental health provider at this facility, Other provider involved in patient's care AEB patient being seen by Podiatry, and Referral or follow-up with counselor/therapist AEB patient being seen by a licensed clinical social worker at this facility  Patient/Guardian was advised Release of Information must be obtained prior to any record release in order to collaborate their care with an outside provider. Patient/Guardian was advised if they have not already done so to contact the registration department to sign all necessary forms in order for us  to release information regarding their care.   Consent: Patient/Guardian gives verbal consent for treatment and assignment of benefits for services provided during this  visit. Patient/Guardian expressed understanding and agreed to proceed.   1. Bipolar 2 disorder, major depressive episode (HCC)  - lamoTRIgine  (LAMICTAL ) 100 MG tablet; Take 1 tablet (100 mg total) by mouth daily.  Dispense: 30 tablet; Refill: 1 - gabapentin  (NEURONTIN ) 400 MG capsule; Take 1 capsule (400 mg total) by mouth 3 (three) times daily.  Dispense: 90 capsule; Refill: 1 - lumateperone  tosylate (CAPLYTA ) 42 MG capsule; Take 1 capsule (42 mg total) by mouth  daily.  Dispense: 30 capsule; Refill: 1  2. Generalized anxiety disorder  - busPIRone  (BUSPAR ) 30 MG tablet; Take 1 tablet (30 mg total) by mouth 2 (two) times daily.  Dispense: 60 tablet; Refill: 1 - gabapentin  (NEURONTIN ) 400 MG capsule; Take 1 capsule (400 mg total) by mouth 3 (three) times daily.  Dispense: 90 capsule; Refill: 1 - mirtazapine  (REMERON ) 7.5 MG tablet; Take 1 tablet (7.5 mg total) by mouth at bedtime.  Dispense: 30 tablet; Refill: 1  3. Insomnia due to other mental disorder (Primary)  4. Long term current use of antipsychotic medication Pending labs Referral to EKG  Patient to follow-up in 5 weeks Provider spent a total of 22 minutes with the patient/reviewing patient's chart  Tina FORBES Bolster, PA 07/07/2024, 10:30 AM

## 2024-07-11 ENCOUNTER — Other Ambulatory Visit (HOSPITAL_COMMUNITY): Payer: Self-pay

## 2024-07-11 ENCOUNTER — Other Ambulatory Visit: Payer: Self-pay

## 2024-07-12 ENCOUNTER — Other Ambulatory Visit (HOSPITAL_COMMUNITY): Payer: Self-pay

## 2024-07-13 ENCOUNTER — Other Ambulatory Visit (HOSPITAL_COMMUNITY): Payer: Self-pay

## 2024-07-22 ENCOUNTER — Ambulatory Visit (INDEPENDENT_AMBULATORY_CARE_PROVIDER_SITE_OTHER): Admitting: Clinical

## 2024-07-22 DIAGNOSIS — F3181 Bipolar II disorder: Secondary | ICD-10-CM
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
## 2024-07-26 ENCOUNTER — Telehealth: Payer: Self-pay | Admitting: *Deleted

## 2024-07-26 NOTE — Telephone Encounter (Signed)
 She would be just a couple of days early for her A1c. She can reschedule for 10/10 or after for doctor's visit, diabetes check, and Depo injection.

## 2024-07-26 NOTE — Telephone Encounter (Signed)
 Patient is planning to relocate to [place]  and is trying to get everything done prior to leaving.  She will be unable to try to schedule with a doctor for 2 weeks due to her insurance.

## 2024-07-26 NOTE — Telephone Encounter (Signed)
 RTC to patient is due to come in for [name] Depo Injection on 08/10/2024.  Wants to know if she needs to see the doctor that day for [name] doctors visit as well and do [name] HBA1C ? Copied from CRM [id number]. Topic: Clinical - Medical Advice >> Jul 26, 2024  8:55 AM Tina [name] wrote: Reason for CRM: Patient called in to see if it is time for her to have her A1C checked and if so if she can have it checked at appointment on 10/8. Patient can be reached at [phone]. Please contact patient to advise.

## 2024-08-02 NOTE — Telephone Encounter (Signed)
 Pt has upcoming appt on 10/8 for depo shot.

## 2024-08-03 ENCOUNTER — Other Ambulatory Visit: Payer: Self-pay

## 2024-08-03 DIAGNOSIS — Z794 Long term (current) use of insulin: Secondary | ICD-10-CM

## 2024-08-03 NOTE — Telephone Encounter (Unsigned)
 Copied from CRM [id number]. Topic: Clinical - Medication Refill >> Aug 03, 2024  2:50 PM Mercer PEDLAR wrote: Medication:  Continuous Glucose Sensor (FREESTYLE LIBRE 3 PLUS SENSOR) MISC  Insulin  Lispro Prot & Lispro (HUMALOG  MIX 75/25 KWIKPEN) (75-25) 100 UNIT/ML Kwikpen  atorvastatin  (LIPITOR) 40 MG tablet  Has the patient contacted their pharmacy? Yes (Agent: If no, request that the patient contact the pharmacy for the refill. If patient does not wish to contact the pharmacy document the reason why and proceed with request.) (Agent: If yes, when and what did the pharmacy advise?)  This is the patient's preferred pharmacy:  [facility] [address] [employer] [place] [address] Phone: [phone] Fax: [phone]  Is this the correct pharmacy for this prescription? Yes If no, delete pharmacy and type the correct one.   Has the prescription been filled recently? No  Is the patient out of the medication? Yes  Has the patient been seen for an appointment in the last year OR does the patient have an upcoming appointment? Yes  Can we respond through MyChart? Yes  Agent: Please be advised that Rx refills may take up to 3 business days. We ask that you follow-up with your pharmacy.

## 2024-08-04 ENCOUNTER — Other Ambulatory Visit: Payer: Self-pay

## 2024-08-04 ENCOUNTER — Other Ambulatory Visit (HOSPITAL_COMMUNITY): Payer: Self-pay

## 2024-08-04 MED ORDER — ATORVASTATIN CALCIUM 40 MG PO TABS
40.0000 mg | ORAL_TABLET | Freq: Every day | ORAL | 2 refills | Status: AC
  Filled 2024-08-04 – 2024-09-05 (×5): qty 100, 100d supply, fill #0
  Filled 2024-09-06: qty 90, 90d supply, fill #0
  Filled 2024-10-01: qty 100, 100d supply, fill #0
  Filled 2024-12-01: qty 100, 100d supply, fill #1

## 2024-08-04 MED ORDER — INSULIN LISPRO PROT & LISPRO (75-25 MIX) 100 UNIT/ML KWIKPEN
30.0000 [IU] | PEN_INJECTOR | Freq: Two times a day (BID) | SUBCUTANEOUS | 2 refills | Status: AC
  Filled 2024-08-04: qty 18, 30d supply, fill #0
  Filled 2024-09-04: qty 18, 30d supply, fill #1
  Filled 2024-10-01: qty 18, 30d supply, fill #2

## 2024-08-04 MED ORDER — FREESTYLE LIBRE 3 PLUS SENSOR MISC
3 refills | Status: AC
  Filled 2024-08-04 (×2): qty 2, fill #0
  Filled 2024-08-05 – 2024-09-04 (×2): qty 2, 30d supply, fill #0
  Filled 2024-10-01: qty 2, 30d supply, fill #1
  Filled 2024-11-01: qty 2, 30d supply, fill #2
  Filled 2024-12-01: qty 2, 30d supply, fill #3

## 2024-08-05 ENCOUNTER — Ambulatory Visit (HOSPITAL_COMMUNITY): Admitting: Clinical

## 2024-08-05 ENCOUNTER — Other Ambulatory Visit (HOSPITAL_COMMUNITY): Payer: Self-pay

## 2024-08-09 ENCOUNTER — Other Ambulatory Visit: Payer: Self-pay | Admitting: Internal Medicine

## 2024-08-09 ENCOUNTER — Other Ambulatory Visit (HOSPITAL_COMMUNITY): Payer: Self-pay

## 2024-08-09 ENCOUNTER — Other Ambulatory Visit: Payer: Self-pay

## 2024-08-09 DIAGNOSIS — E1165 Type 2 diabetes mellitus with hyperglycemia: Secondary | ICD-10-CM

## 2024-08-10 ENCOUNTER — Ambulatory Visit: Payer: Self-pay

## 2024-08-10 ENCOUNTER — Ambulatory Visit (INDEPENDENT_AMBULATORY_CARE_PROVIDER_SITE_OTHER)

## 2024-08-10 ENCOUNTER — Other Ambulatory Visit (HOSPITAL_COMMUNITY): Payer: Self-pay

## 2024-08-10 VITALS — BP 120/64 | HR 95 | Temp 98.0°F | Ht 73.0 in | Wt 271.6 lb

## 2024-08-10 DIAGNOSIS — Z79899 Other long term (current) drug therapy: Secondary | ICD-10-CM

## 2024-08-10 DIAGNOSIS — Z794 Long term (current) use of insulin: Secondary | ICD-10-CM

## 2024-08-10 DIAGNOSIS — I10 Essential (primary) hypertension: Secondary | ICD-10-CM | POA: Diagnosis not present

## 2024-08-10 DIAGNOSIS — Z0000 Encounter for general adult medical examination without abnormal findings: Secondary | ICD-10-CM | POA: Insufficient documentation

## 2024-08-10 DIAGNOSIS — Z3042 Encounter for surveillance of injectable contraceptive: Secondary | ICD-10-CM

## 2024-08-10 DIAGNOSIS — E785 Hyperlipidemia, unspecified: Secondary | ICD-10-CM | POA: Diagnosis not present

## 2024-08-10 DIAGNOSIS — F17210 Nicotine dependence, cigarettes, uncomplicated: Secondary | ICD-10-CM | POA: Diagnosis not present

## 2024-08-10 DIAGNOSIS — Z833 Family history of diabetes mellitus: Secondary | ICD-10-CM

## 2024-08-10 DIAGNOSIS — Z308 Encounter for other contraceptive management: Secondary | ICD-10-CM

## 2024-08-10 DIAGNOSIS — Z7985 Long-term (current) use of injectable non-insulin antidiabetic drugs: Secondary | ICD-10-CM

## 2024-08-10 DIAGNOSIS — E1165 Type 2 diabetes mellitus with hyperglycemia: Secondary | ICD-10-CM

## 2024-08-10 LAB — POCT GLYCOSYLATED HEMOGLOBIN (HGB A1C): HbA1c, POC (controlled diabetic range): 6.3 % (ref 0.0–7.0)

## 2024-08-10 LAB — GLUCOSE, CAPILLARY: Glucose-Capillary: 106 mg/dL — ABNORMAL HIGH (ref 70–99)

## 2024-08-10 MED ORDER — MEDROXYPROGESTERONE ACETATE 104 MG/0.65ML ~~LOC~~ SUSY
104.0000 mg | PREFILLED_SYRINGE | Freq: Once | SUBCUTANEOUS | Status: AC
  Administered 2024-08-10: 104 mg via SUBCUTANEOUS

## 2024-08-10 NOTE — Assessment & Plan Note (Signed)
 A1c is 6.3 today which is stable from 6.13 months ago. Patient is on Ozempic  2 mg weekly and Humalog  75/2530 units 2 times daily.  Did review her blood sugar readings from her libre 3 and show that she is 98% in target for blood sugar readings and only 2% high with no lows below low 70.  Patient states that she is not symptomatic when she is in the 52s and is fine continue on the current regimen  Plan: Did tell patient that they should get diabetic eye exam done this year, states that she would prefer to do this when she gets to [place]  Up-to-date on microalbumin creatinine ratio Will continue Ozempic  2 mg and Humalog  75/25 30 units 2 times daily

## 2024-08-10 NOTE — Assessment & Plan Note (Signed)
 Patient declined any vaccines at this appointment including influenza, COVID, pneumococcal

## 2024-08-10 NOTE — Progress Notes (Signed)
 Established Patient Office Visit  Subjective   Patient ID: Tanicia Wolaver, female    DOB: 09-20-1971  Age: 53 y.o. MRN: [id number]  Chief Complaint  Patient presents with   Diabetes   Medication Refill   Note for cruise stating patient is diabetes and instructions    HPI Hannan Tetzlaff is a 53 year old female with PMH of type 2 diabetes mellitus, tobacco abuse, hypertension, dyslipidemia who presents today for follow-up  Patient states that she is going on a cruise in the next month and wants a letter stating what medications are injections that she needs to take.  She is also concerned about motion sickness although she has never had motion sickness before and has never tried any medications.  Patient states that they are also moving to [place]  so wants to make sure they are up-to-date on everything before they move as far as screenings and labs.  See problem-based assessment for more details   ROS See problem-based assessment for more details   Objective:     BP 120/64 (BP Location: Left Arm, Patient Position: Sitting, Cuff Size: Small)   Pulse 95   Temp 98 F (36.7 C) (Oral)   Ht 6' 1 (1.854 m)   Wt 271 lb 9.6 oz (123.2 kg)   SpO2 100%   BMI 35.83 kg/m  BP Readings from Last 3 Encounters:  08/10/24 120/64  05/12/24 138/74  04/20/24 132/73   Wt Readings from Last 3 Encounters:  08/10/24 271 lb 9.6 oz (123.2 kg)  05/12/24 272 lb 12.8 oz (123.7 kg)  04/20/24 282 lb (127.9 kg)      Physical Exam Constitution: Alert, in no acute distress, pleasant Lungs: Respiratory effort normal, clear to auscultation Heart regular rate and rhythm, no murmurs heard  Results for orders placed or performed in visit on 08/10/24  Glucose, capillary  Result Value Ref Range   Glucose-Capillary 106 (H) 70 - 99 mg/dL  POC Hbg J8R  Result Value Ref Range   Hemoglobin A1C     HbA1c POC (<> result, manual entry)     HbA1c, POC (prediabetic range)     HbA1c, POC  (controlled diabetic range) 6.3 0.0 - 7.0 %    Last hemoglobin A1c Lab Results  Component Value Date   HGBA1C 6.3 08/10/2024      The ASCVD Risk score (Arnett DK, et al., 2019) failed to calculate for the following reasons:   Unable to determine if patient is Non-Hispanic African American    Assessment & Plan:   Problem List Items Addressed This Visit     Type 2 diabetes mellitus (HCC) - Primary (Chronic)   A1c is 6.3 today which is stable from 6.13 months ago. Patient is on Ozempic  2 mg weekly and Humalog  75/2530 units 2 times daily.  Did review her blood sugar readings from her libre 3 and show that she is 98% in target for blood sugar readings and only 2% high with no lows below low 70.  Patient states that she is not symptomatic when she is in the 1s and is fine continue on the current regimen  Plan: Did tell patient that they should get diabetic eye exam done this year, states that she would prefer to do this when she gets to [place]  Up-to-date on microalbumin creatinine ratio Will continue Ozempic  2 mg and Humalog  75/25 30 units 2 times daily      Relevant Orders   POC Hbg A1C (Completed)   Glucose, capillary (Completed)  Dyslipidemia   Patient is on atorvastatin  40 mg.  Plan:  Check lipid panel today      Relevant Orders   Lipid Profile   Hypertension (Chronic)   Patient is on amlodipine -olmesartan  5-20 mg daily.  Blood pressure at today's visit is 120/64  Plan: Blood pressure looks good today, will continue management Will check BMP today to assess kidney function and electrolytes      Encounter for contraceptive management   Patient received Depo shot today.  Patient has been wearing this shot for a while and was due to fibroids.  Unsure if she has gone through menopause yet so did suggest 1 patient does move to [place]  that she should establish with an OB/GYN and see if she is able to come off of this medication as if she has gone through menopause at this  point fibroids have likely shrunk      Health care maintenance   Patient declined any vaccines at this appointment including influenza, COVID, pneumococcal      Other Visit Diagnoses       Essential hypertension       Relevant Orders   Basic metabolic panel with GFR      Return in about 3 months (around 11/10/2024).    Ariyah Sedlack D'Mello, DO Patient seen with Dr. Karna

## 2024-08-10 NOTE — Assessment & Plan Note (Signed)
 Patient received Depo shot today.  Patient has been wearing this shot for a while and was due to fibroids.  Unsure if she has gone through menopause yet so did suggest 1 patient does move to [place]  that she should establish with an OB/GYN and see if she is able to come off of this medication as if she has gone through menopause at this point fibroids have likely shrunk

## 2024-08-10 NOTE — Assessment & Plan Note (Signed)
 Patient is on atorvastatin  40 mg.  Plan:  Check lipid panel today

## 2024-08-10 NOTE — Patient Instructions (Signed)
 Your Next Depo injection should be given between Dec 24th-Jan 7th   Today we discussed the following medical conditions and plan:   For your diabetes, we will continue you on the same regimen that you are on.  Your A1c looks good today.  I would still recommend getting an eye exam done whenever you have the time.  Your blood pressure looks good today.  We will get some labs to check your electrolytes and your cholesterol today.  I hope you have fun on your cruise!  I have printed out the letter that you can give to them that states your medications  We look forward to seeing you next time. Please call our clinic at [phone] if you have any questions or concerns. The best time to call is Monday-Friday from 9am-4pm, but there is someone available 24/7. If you need medication refills, please notify your pharmacy one week in advance and they will send us  a request.   Thank you for trusting me with your care. Wishing you the best!   Kedra Mcglade D'Mello, DO  [facility]

## 2024-08-10 NOTE — Addendum Note (Signed)
 Addended by: Kymia Simi C on: 08/10/2024 03:58 PM   Modules accepted: Orders

## 2024-08-10 NOTE — Assessment & Plan Note (Signed)
 Patient is on amlodipine -olmesartan  5-20 mg daily.  Blood pressure at today's visit is 120/64  Plan: Blood pressure looks good today, will continue management Will check BMP today to assess kidney function and electrolytes

## 2024-08-11 ENCOUNTER — Encounter: Payer: Self-pay | Admitting: Podiatry

## 2024-08-11 ENCOUNTER — Ambulatory Visit (INDEPENDENT_AMBULATORY_CARE_PROVIDER_SITE_OTHER): Admitting: Podiatry

## 2024-08-11 ENCOUNTER — Other Ambulatory Visit (HOSPITAL_COMMUNITY): Payer: Self-pay

## 2024-08-11 ENCOUNTER — Ambulatory Visit: Payer: Self-pay

## 2024-08-11 VITALS — Ht 73.0 in | Wt 271.6 lb

## 2024-08-11 DIAGNOSIS — M65971 Unspecified synovitis and tenosynovitis, right ankle and foot: Secondary | ICD-10-CM

## 2024-08-11 DIAGNOSIS — E1149 Type 2 diabetes mellitus with other diabetic neurological complication: Secondary | ICD-10-CM

## 2024-08-11 DIAGNOSIS — Q828 Other specified congenital malformations of skin: Secondary | ICD-10-CM | POA: Diagnosis not present

## 2024-08-11 LAB — BASIC METABOLIC PANEL WITH GFR
BUN/Creatinine Ratio: 16 (ref 9–23)
BUN: 12 mg/dL (ref 6–24)
CO2: 19 mmol/L — ABNORMAL LOW (ref 20–29)
Calcium: 9.7 mg/dL (ref 8.7–10.2)
Chloride: 103 mmol/L (ref 96–106)
Creatinine, Ser: 0.76 mg/dL (ref 0.57–1.00)
Glucose: 98 mg/dL (ref 70–99)
Potassium: 4.1 mmol/L (ref 3.5–5.2)
Sodium: 141 mmol/L (ref 134–144)
eGFR: 92 mL/min/1.73 (ref >59)

## 2024-08-11 LAB — LIPID PANEL
Chol/HDL Ratio: 4.6 ratio — ABNORMAL HIGH (ref 0.0–4.4)
Cholesterol, Total: 152 mg/dL (ref 100–199)
HDL: 33 mg/dL — ABNORMAL LOW (ref >39)
LDL Chol Calc (NIH): 94 mg/dL (ref 0–99)
Triglycerides: 141 mg/dL (ref 0–149)
VLDL Cholesterol Cal: 25 mg/dL (ref 5–40)

## 2024-08-11 MED ORDER — TRIAMCINOLONE ACETONIDE 10 MG/ML IJ SUSP
5.0000 mg | Freq: Once | INTRAMUSCULAR | Status: AC
  Administered 2024-08-11: 5 mg via INTRAMUSCULAR

## 2024-08-11 MED ORDER — CELECOXIB 100 MG PO CAPS
100.0000 mg | ORAL_CAPSULE | Freq: Two times a day (BID) | ORAL | 1 refills | Status: AC
  Filled 2024-08-11: qty 30, 15d supply, fill #0
  Filled 2024-09-04: qty 30, 15d supply, fill #1

## 2024-08-11 NOTE — Progress Notes (Signed)
 Internal Medicine Clinic Attending  I was physically present during the key portions of the resident provided service and participated in the medical decision making of patient's management care. I reviewed pertinent patient test results.  The assessment, diagnosis, and plan were formulated together and I agree with the documentation in the resident's note.  Dickie La, MD

## 2024-08-11 NOTE — Addendum Note (Signed)
 Addended by: KARNA FELLOWS on: 08/11/2024 03:17 PM   Modules accepted: Level of Service

## 2024-08-11 NOTE — Patient Instructions (Addendum)
 Celecoxib  Capsules What is this medication? CELECOXIB  (sell a KOX ib) treats mild to moderate pain, inflammation, or arthritis. It belongs to a group of medications called NSAIDs. This medicine may be used for other purposes; ask your health care provider or pharmacist if you have questions. COMMON BRAND NAME(S): Celebrex  What should I tell my care team before I take this medication? They need to know if you have any of these conditions: Asthma Bleeding problems Dehydration Frequently drink alcohol Have had a heart attack, stroke, or mini-stroke Heart bypass surgery, or CABG, within the past 2 weeks Heart or blood vessel conditions Heart failure High blood pressure Kidney disease Liver disease Stomach bleeding Stomach ulcers, other stomach or intestine problems Tobacco use An unusual or allergic reaction to celecoxib , other medications, foods, dyes, or preservatives Pregnant or trying to get pregnant Breastfeeding How should I use this medication? Take this medication by mouth with water. Take it as directed on the prescription label at the same time every day. Do not cut, crush, or chew this medication. Swallow the capsules whole. You may open the capsule and put the contents in 1 teaspoon of applesauce. Swallow the medication and applesauce right away. Do not chew the medication or applesauce. A special MedGuide will be given to you by the pharmacist with each prescription and refill. Be sure to read this information carefully each time. Talk to your care team about the use of this medication in children. While it may be prescribed for children as young as 2 years for selected conditions, precautions do apply. People 65 years and older may have a stronger reaction and need a smaller dose. Overdosage: If you think you have taken too much of this medicine contact a poison control center or emergency room at once. NOTE: This medicine is only for you. Do not share this medicine with  others. What if I miss a dose? If you miss a dose, take it as soon as you can. If it is almost time for your next dose, take only that dose. Do not take double or extra doses. What may interact with this medication? Do not take this medication with any of the following: Cidofovir Ketorolac  Thioridazine This medication may also interact with the following: Alcohol Aspirin and aspirin-like medications Blood thinners Cyclosporine Digoxin Diuretics Lithium Medications for blood pressure Methotrexate Other NSAIDs, medications for pain and inflammation, such as ibuprofen  or naproxen  Some medications for depression Steroid medications, such as prednisone  or cortisone Supplements, such as garlic, ginger, ginkgo, methylsulfonylmethane (MSM) This medication may affect how other medications work. Talk with your care team about all the medications you take. They may suggest changes to your treatment plan to lower the risk of side effects and to make sure your medications work as intended. This list may not describe all possible interactions. Give your health care provider a list of all the medicines, herbs, non-prescription drugs, or dietary supplements you use. Also tell them if you smoke, drink alcohol, or use illegal drugs. Some items may interact with your medicine. What should I watch for while using this medication? Visit your care team for regular checks on your progress. Tell your care team if your symptoms do not start to get better or if they get worse. Do not take aspirin or other NSAIDs, such as ibuprofen  or naproxen , while you are taking this medication. Side effects, such as upset stomach, nausea, and ulcers, may be more likely to occur. Many over-the-counter medications contain aspirin, ibuprofen , or naproxen . It is  important to read labels carefully. Talk to your care team about all the medications you take. They can tell you what is safe to take together. This medication can cause  serious bleeding, ulcers, or tears in the stomach. These problems can occur at any time and with no warning signs. They are more common with long-term use. Talk to your care team right away if you have stomach pain, bloody or black, tar-like stools, or vomit blood that is red or looks like coffee grounds. This medication increases the risk of blood clots, heart attack, and stroke. These events can occur at any time. They are more common with long-term use and in those who have heart disease. If you take aspirin to prevent a heart attack or stroke, talk to your care team. They can help you find an option that works for you. This medication may cause serious skin reactions. They can happen weeks to months after starting the medication. Talk to your care team right away if you have fevers or flu-like symptoms with a rash. The rash may be red or purple and then turn into blisters or peeling of the skin. Or you might notice a red rash with swelling of the face, lips, or lymph nodes in your neck or under your arms. Talk to your care team if you may be pregnant. Taking this medication after 20 weeks of pregnancy may cause serious birth defects. Use of this medication after 30 weeks of pregnancy is not recommended. This medication may cause infertility. It is usually temporary. Talk to your care team if you are concerned about your fertility. What side effects may I notice from receiving this medication? Side effects that you should report to your care team as soon as possible: Allergic reactions--skin rash, itching, hives, swelling of the face, lips, tongue, or throat Bleeding--bloody or black, tar-like stools, vomiting blood or brown material that looks like coffee grounds, red or dark brown urine, small red or purple spots on skin, unusual bruising or bleeding Heart attack--pain or tightness in the chest, shoulders, arms, or jaw, nausea, shortness of breath, cold or clammy skin, feeling faint or  lightheaded Heart failure--shortness of breath, swelling of the ankles, feet, or hands, sudden weight gain, unusual weakness or fatigue Increase in blood pressure Kidney injury--decrease in the amount of urine, swelling of the ankles, hands, or feet Liver injury--right upper belly pain, loss of appetite, nausea, light-colored stool, dark yellow or brown urine, yellowing skin or eyes, unusual weakness or fatigue Rash, fever, and swollen lymph nodes Redness, blistering, peeling, or loosening of the skin, including inside the mouth Round red or dark patches on the skin that may itch, burn, and blister Stroke--sudden numbness or weakness of the face, arm, or leg, trouble speaking, confusion, trouble walking, loss of balance or coordination, dizziness, severe headache, change in vision Side effects that usually do not require medical attention (report these to your care team if they continue or are bothersome): Headache Loss of appetite Nausea Upset stomach This list may not describe all possible side effects. Call your doctor for medical advice about side effects. You may report side effects to FDA at 1-800-FDA-1088. Where should I keep my medication? Keep out of the reach of children and pets. Store at room temperature between 15 and 30 degrees C (59 and 86 degrees F). Get rid of any unused medication after the expiration date. To get rid of medications that are no longer needed or have expired: Take the medication to a medication  take-back program. Check with your pharmacy or law enforcement to find a location. If you cannot return the medication, check the label or package insert to see if the medication should be thrown out in the garbage or flushed down the toilet. If you are not sure, ask your care team. If it is safe to put it in the trash, empty the medication out of the container. Mix the medication with cat litter, dirt, coffee grounds, or other unwanted substance. Seal the mixture in a  bag or container. Put it in the trash. NOTE: This sheet is a summary. It may not cover all possible information. If you have questions about this medicine, talk to your doctor, pharmacist, or health care provider.  2025 Elsevier/Gold Standard (2023-12-29 00:00:00)

## 2024-08-11 NOTE — Progress Notes (Signed)
 Subjective: Chief Complaint  Patient presents with   Foot Pain    Pt is here to f/u on right foot to arthritis, she states that the pain is still there and swelling. No other complaints.     53 year old female presents the office today for concerns of painful calluses on both of her feet.  She does not report any open lesions.    She states that she still having pain to the right ankle she had swelling which has been ongoing issue.  She reports last injection was helpful he is asking for another injection today she also takes Celebrex  as needed.  Last A1c was 6.3 on August 10, 2024  Shawn Sick, MD Last seen: 710/06/2024  Objective: AAO x3, NAD DP/PT pulses palpable bilaterally, CRT less than 3 seconds Significant bunions are present bilateral with hammertoes deformities bilaterally.  Flatfoot is present bilaterally.   Hyperkeratotic lesion left submetatarsal 2 as well as fifth metatarsal head on the left foot.  Minimal callus on the medial aspect the left second toe.  There is no underlying ulceration drainage or any signs of infection.  There is no ulcerations. Mild diffuse discomfort of the ankle with mild chronic swelling, most notably to the lateral ankle. No area of pinpoint tenderness.   Previous triple arthrodesis noted. No pain with calf compression, swelling, warmth, erythema  Assessment: Right ankle arthritis, flatfoot; hyperkeratotic lesions  Plan: Right foot pain, arthritis/flatfoot - Refilled Celebrex  but hold off on other anti-inflammatories.   -Steroid injection warmed.  Verbal consent obtained.  I cleaned the skin with Betadine, alcohol.  Mixture 1 cc Kenalog  10, 0.5 cc of Marcaine plain, 0.5 cc of lidocaine  plain was infiltrated into the anterior lateral aspect of the ankle joint without complications.  Postinjection care discussed.  Tolerated well.  Preulcerative calluses -Sharply debrided hyperkeratotic lesions x 3 without any complications or bleeding  Donnice JONELLE Fees DPM

## 2024-08-19 ENCOUNTER — Encounter: Payer: Self-pay | Admitting: Podiatry

## 2024-08-22 NOTE — Telephone Encounter (Signed)
 Patient advised to pick up

## 2024-08-23 ENCOUNTER — Ambulatory Visit: Admitting: Podiatry

## 2024-09-02 ENCOUNTER — Ambulatory Visit (INDEPENDENT_AMBULATORY_CARE_PROVIDER_SITE_OTHER): Admitting: Clinical

## 2024-09-02 DIAGNOSIS — F3181 Bipolar II disorder: Secondary | ICD-10-CM

## 2024-09-02 NOTE — Progress Notes (Signed)
   THERAPIST PROGRESS NOTE Virtual Visit via Video Note  I connected with Tina [name] on 09/02/2024 at  8:00 AM EDT by a video enabled telemedicine application and verified that I am speaking with the correct person using two identifiers.  Location: Patient: home Provider: remote office at home   I discussed the limitations of evaluation and management by telemedicine and the availability of in person appointments. The patient expressed understanding and agreed to proceed.   Follow Up Instructions: I discussed the assessment and treatment plan with the patient. The patient was provided an opportunity to ask questions and all were answered. The patient agreed with the plan and demonstrated an understanding of the instructions.   The patient was advised to call back or seek an in-person evaluation if the symptoms worsen or if the condition fails to improve as anticipated.   Session Time: 30 min  Participation Level: Active  Behavioral Response: CasualAlertDepressed  Type of Therapy: Individual Therapy  Treatment Goals addressed: client will engage in at least 80% of scheduled individual psychotherapy sessions  ProgressTowards Goals: Progressing  Interventions: CBT and Supportive  Summary:  Tina [name] is a 53 y.o. female who presents for the scheduled appointment oriented times five, appropriately dressed and friendly. Client denied hallucinations and delusions. Client reported she is doing fairly okay. Client reported going through the transition to leave her mothers home has been challenging. Client reported her favorite brother has tried to be neutral to appease both sides. Client reported all its doing it upsetting her. Client reported she is trying to focus on doing what is going to keep her feeling mentally stable. Client reported space from her family is what she needs to do. Client reported she does have some support from her nephew and her  niece. Client reported her family in [place]  has been supportive as well. Evidence of progress towards goal:  client reported medication compliance 7 days per week.  Suicidal/Homicidal: Nowithout intent/plan  Therapist Response:  Therapist began the appointment asking how she has been doing. Therapist engaged with active listening and positive emotional support. Therapist used cbt to engage and give her time to discuss her thoughts and feelings. Therapist used cbt to normalize her emotions within reason. Therapist used CBT ask the client to identify her progress with frequency of use with coping skills with continued practice in her daily activity.    Therapist assigned self care for homework.   Plan: Return again in 4 weeks.  Diagnosis: bipolar 2 disorder, MDD  Collaboration of Care: Patient refused AEB none requested.  Patient/Guardian was advised Release of Information must be obtained prior to any record release in order to collaborate their care with an outside provider. Patient/Guardian was advised if they have not already done so to contact the registration department to sign all necessary forms in order for us  to release information regarding their care.   Consent: Patient/Guardian gives verbal consent for treatment and assignment of benefits for services provided during this visit. Patient/Guardian expressed understanding and agreed to proceed.   Bryann Gentz Y Chiyeko Ferre, LCSW 09/02/2024

## 2024-09-04 ENCOUNTER — Other Ambulatory Visit (HOSPITAL_COMMUNITY): Payer: Self-pay | Admitting: Physician Assistant

## 2024-09-04 ENCOUNTER — Other Ambulatory Visit: Payer: Self-pay

## 2024-09-04 DIAGNOSIS — F3181 Bipolar II disorder: Secondary | ICD-10-CM

## 2024-09-05 ENCOUNTER — Telehealth (HOSPITAL_COMMUNITY): Payer: Self-pay

## 2024-09-05 ENCOUNTER — Other Ambulatory Visit: Payer: Self-pay

## 2024-09-05 ENCOUNTER — Ambulatory Visit (HOSPITAL_COMMUNITY): Admitting: Clinical

## 2024-09-05 ENCOUNTER — Encounter (HOSPITAL_COMMUNITY): Payer: Self-pay

## 2024-09-05 ENCOUNTER — Other Ambulatory Visit (HOSPITAL_COMMUNITY): Payer: Self-pay

## 2024-09-05 DIAGNOSIS — E1165 Type 2 diabetes mellitus with hyperglycemia: Secondary | ICD-10-CM

## 2024-09-05 MED ORDER — LUMATEPERONE TOSYLATE 42 MG PO CAPS
42.0000 mg | ORAL_CAPSULE | Freq: Every day | ORAL | 1 refills | Status: DC
  Filled 2024-09-05: qty 30, 30d supply, fill #0
  Filled 2024-10-01: qty 30, 30d supply, fill #1

## 2024-09-05 MED ORDER — METFORMIN HCL 1000 MG PO TABS
1000.0000 mg | ORAL_TABLET | Freq: Two times a day (BID) | ORAL | 3 refills | Status: AC
  Filled 2024-09-05: qty 60, 30d supply, fill #0
  Filled 2024-10-01: qty 60, 30d supply, fill #1
  Filled 2024-11-02: qty 60, 30d supply, fill #2
  Filled 2024-12-01: qty 60, 30d supply, fill #3

## 2024-09-05 NOTE — Telephone Encounter (Signed)
 Medication sent to pharmacy

## 2024-09-05 NOTE — Telephone Encounter (Signed)
 Copied from CRM [id number]. Topic: Clinical - Medication Refill >> Sep 05, 2024  8:27 AM Susanna ORN wrote: Medication: metFORMIN  (GLUCOPHAGE ) 1000 MG tablet  Has the patient contacted their pharmacy? No (Agent: If no, request that the patient contact the pharmacy for the refill. If patient does not wish to contact the pharmacy document the reason why and proceed with request.) (Agent: If yes, when and what did the pharmacy advise?)  This is the patient's preferred pharmacy:  [facility] [address] [place] [address] Phone: [phone] Fax: [phone]  Is this the correct pharmacy for this prescription? Yes If no, delete pharmacy and type the correct one.   Has the prescription been filled recently? Yes  Is the patient out of the medication? Yes  Has the patient been seen for an appointment in the last year OR does the patient have an upcoming appointment? Yes  Can we respond through MyChart? Yes  Agent: Please be advised that Rx refills may take up to 3 business days. We ask that you follow-up with your pharmacy.

## 2024-09-05 NOTE — Telephone Encounter (Signed)
 Summary: Take 1 capsule (42 mg total) by mouth daily., Starting Thu 07/07/2024, Normal Dose, Route, Frequency: 42 mg, Oral, DailyStart: 09/04/2025Ordered On: 09/04/2025Last Dispense: Refill request waiting for approval (Last dispensed: 08/09/2024)[facility] [place] [facility] Associated: Taking: Long-term: Med Note:         Change Directions: Take 1 capsule (42 mg total) by mouth daily. Ordering Department: [facility] Authorized By: Nwoko, Uchenna E, PA Dispense: 30 capsule Refills: 0 of 1 remaining

## 2024-09-06 ENCOUNTER — Other Ambulatory Visit (HOSPITAL_COMMUNITY): Payer: Self-pay

## 2024-09-06 NOTE — Progress Notes (Signed)
 Tina [name]                                          MRN: [id number]   09/06/2024   The VBCI Quality Team Specialist reviewed this patient medical record for the purposes of chart review for care gap closure. The following were reviewed: abstraction for care gap closure-glycemic status assessment.    VBCI Quality Team

## 2024-09-09 ENCOUNTER — Ambulatory Visit (INDEPENDENT_AMBULATORY_CARE_PROVIDER_SITE_OTHER): Admitting: Clinical

## 2024-09-09 DIAGNOSIS — F3181 Bipolar II disorder: Secondary | ICD-10-CM | POA: Diagnosis not present

## 2024-09-09 NOTE — Progress Notes (Signed)
   THERAPIST PROGRESS NOTE Virtual Visit via Video Note  I connected with Tina [name] on 09/09/24 at  8:00 AM EST by a video enabled telemedicine application and verified that I am speaking with the correct person using two identifiers.  Location: Patient: home Provider: office   I discussed the limitations of evaluation and management by telemedicine and the availability of in person appointments. The patient expressed understanding and agreed to proceed.   Follow Up Instructions: I discussed the assessment and treatment plan with the patient. The patient was provided an opportunity to ask questions and all were answered. The patient agreed with the plan and demonstrated an understanding of the instructions.   The patient was advised to call back or seek an in-person evaluation if the symptoms worsen or if the condition fails to improve as anticipated.   Session Time: 30 min  Participation Level: Active  Behavioral Response: CasualAlertAnxious  Type of Therapy: Individual Therapy  Treatment Goals addressed: client will engage in at least 80% of individual psychotherapy sessions  ProgressTowards Goals: Progressing  Interventions: CBT  Summary:  Tina [name] is a 53 y.o. female who presents for the scheduled appointment oriented times five, appropriately dressed and friendly. Client denied hallucinations and delusions. Client reported she has been depressed. Client reported with the transition starting to take effect going from [place] to [facility] she feels overwhelmed. Client reported its time like this when she misses her mother. Client reported with the government closed she is not getting food stamps. Client reported she has to stretch her money and she is on a fixed income. 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 Client reported she knows its all apart of what she has to do but she wishes she could get a break. Client reported  otherwise she is managing things okay. Evidence of progress towards goal:  client reported she is able to do 1 skill of problem solving and rationalizing.  Suicidal/Homicidal: Nowithout intent/plan  Therapist Response:  Therapist began the appointment asking the client how she has been doing. Therapist engaged with active listening and positive emotional support. Therapist used cbt to engage and give her time to discuss her thoughts and feelings. Therapist used cbt to normalize how she feels within reason. Therapist used CBT ask the client to identify her progress with frequency of use with coping skills with continued practice in her daily activity.      Plan: Return again in 4 weeks.  Diagnosis: bipolar 2, mdd  Collaboration of Care: Patient refused AEB none requested.  Patient/Guardian was advised Release of Information must be obtained prior to any record release in order to collaborate their care with an outside provider. Patient/Guardian was advised if they have not already done so to contact the registration department to sign all necessary forms in order for us  to release information regarding their care.   Consent: Patient/Guardian gives verbal consent for treatment and assignment of benefits for services provided during this visit. Patient/Guardian expressed understanding and agreed to proceed.   Aleph Nickson Y Earsie Humm, LCSW 09/09/2024

## 2024-10-01 ENCOUNTER — Other Ambulatory Visit (HOSPITAL_COMMUNITY): Payer: Self-pay | Admitting: Physician Assistant

## 2024-10-01 ENCOUNTER — Other Ambulatory Visit (HOSPITAL_COMMUNITY): Payer: Self-pay

## 2024-10-01 ENCOUNTER — Other Ambulatory Visit: Payer: Self-pay

## 2024-10-01 DIAGNOSIS — F17200 Nicotine dependence, unspecified, uncomplicated: Secondary | ICD-10-CM

## 2024-10-01 DIAGNOSIS — F3181 Bipolar II disorder: Secondary | ICD-10-CM

## 2024-10-01 DIAGNOSIS — F411 Generalized anxiety disorder: Secondary | ICD-10-CM

## 2024-10-02 ENCOUNTER — Other Ambulatory Visit: Payer: Self-pay

## 2024-10-03 ENCOUNTER — Telehealth (HOSPITAL_COMMUNITY): Payer: Self-pay

## 2024-10-03 NOTE — Telephone Encounter (Signed)
 Patient called in requesting refill of medications. Patient does not have any follow up appointment. Front desk is will call patient to schedule an appointment    (BUSPAR ) 30 MG tablet  gabapentin  (NEURONTIN ) 400 MG capsule lamoTRIgine  (LAMICTAL ) 100 MG tablet mirtazapine  (REMERON ) 7.5 MG tablet

## 2024-10-04 ENCOUNTER — Other Ambulatory Visit (HOSPITAL_COMMUNITY): Payer: Self-pay

## 2024-10-07 ENCOUNTER — Other Ambulatory Visit (HOSPITAL_COMMUNITY): Payer: Self-pay

## 2024-10-07 ENCOUNTER — Other Ambulatory Visit (HOSPITAL_COMMUNITY): Payer: Self-pay | Admitting: Physician Assistant

## 2024-10-07 DIAGNOSIS — F411 Generalized anxiety disorder: Secondary | ICD-10-CM

## 2024-10-07 DIAGNOSIS — F3181 Bipolar II disorder: Secondary | ICD-10-CM

## 2024-10-11 ENCOUNTER — Other Ambulatory Visit (HOSPITAL_COMMUNITY): Payer: Self-pay | Admitting: Physician Assistant

## 2024-10-11 ENCOUNTER — Other Ambulatory Visit: Payer: Self-pay

## 2024-10-11 ENCOUNTER — Other Ambulatory Visit (HOSPITAL_COMMUNITY): Payer: Self-pay

## 2024-10-11 DIAGNOSIS — F3181 Bipolar II disorder: Secondary | ICD-10-CM

## 2024-10-11 DIAGNOSIS — F411 Generalized anxiety disorder: Secondary | ICD-10-CM

## 2024-10-11 MED ORDER — LUMATEPERONE TOSYLATE 42 MG PO CAPS
42.0000 mg | ORAL_CAPSULE | Freq: Every day | ORAL | 1 refills | Status: DC
  Filled 2024-10-11 – 2024-11-02 (×3): qty 30, 30d supply, fill #0

## 2024-10-11 MED ORDER — GABAPENTIN 400 MG PO CAPS
400.0000 mg | ORAL_CAPSULE | Freq: Three times a day (TID) | ORAL | 1 refills | Status: DC
  Filled 2024-10-11: qty 90, 30d supply, fill #0
  Filled 2024-11-07: qty 90, 30d supply, fill #1

## 2024-10-11 MED ORDER — LAMOTRIGINE 100 MG PO TABS
100.0000 mg | ORAL_TABLET | Freq: Every day | ORAL | 1 refills | Status: DC
  Filled 2024-10-11: qty 30, 30d supply, fill #0
  Filled 2024-11-07: qty 30, 30d supply, fill #1

## 2024-10-11 MED ORDER — MIRTAZAPINE 7.5 MG PO TABS
7.5000 mg | ORAL_TABLET | Freq: Every day | ORAL | 1 refills | Status: DC
  Filled 2024-10-11: qty 30, 30d supply, fill #0
  Filled 2024-11-07: qty 30, 30d supply, fill #1

## 2024-10-11 NOTE — Telephone Encounter (Signed)
 Message acknowledged and reviewed.

## 2024-10-11 NOTE — Telephone Encounter (Signed)
 Message acknowledged and reviewed. Patient's medication were e-prescribed to pharmacy of choice.

## 2024-10-12 ENCOUNTER — Telehealth (INDEPENDENT_AMBULATORY_CARE_PROVIDER_SITE_OTHER): Admitting: Physician Assistant

## 2024-10-12 DIAGNOSIS — F5105 Insomnia due to other mental disorder: Secondary | ICD-10-CM | POA: Diagnosis not present

## 2024-10-12 DIAGNOSIS — Z79899 Other long term (current) drug therapy: Secondary | ICD-10-CM

## 2024-10-12 DIAGNOSIS — F3181 Bipolar II disorder: Secondary | ICD-10-CM

## 2024-10-12 DIAGNOSIS — F411 Generalized anxiety disorder: Secondary | ICD-10-CM

## 2024-10-12 DIAGNOSIS — F99 Mental disorder, not otherwise specified: Secondary | ICD-10-CM

## 2024-10-13 ENCOUNTER — Other Ambulatory Visit (HOSPITAL_COMMUNITY): Payer: Self-pay

## 2024-10-13 ENCOUNTER — Other Ambulatory Visit (HOSPITAL_COMMUNITY): Payer: Self-pay | Admitting: Physician Assistant

## 2024-10-13 DIAGNOSIS — F3181 Bipolar II disorder: Secondary | ICD-10-CM

## 2024-10-13 MED ORDER — UNISOM SLEEPTABS 25 MG PO TABS
50.0000 mg | ORAL_TABLET | Freq: Every evening | ORAL | 1 refills | Status: AC | PRN
  Filled 2024-10-13: qty 30, 15d supply, fill #0
  Filled 2024-11-02: qty 32, 16d supply, fill #0

## 2024-10-13 MED ORDER — LAMOTRIGINE 25 MG PO TABS
ORAL_TABLET | ORAL | 0 refills | Status: AC
  Filled 2024-10-13: qty 42, 28d supply, fill #0

## 2024-10-13 MED ORDER — BUSPIRONE HCL 30 MG PO TABS
30.0000 mg | ORAL_TABLET | Freq: Two times a day (BID) | ORAL | 1 refills | Status: DC
  Filled 2024-10-13: qty 60, 30d supply, fill #0
  Filled 2024-11-07: qty 60, 30d supply, fill #1

## 2024-10-14 ENCOUNTER — Ambulatory Visit (INDEPENDENT_AMBULATORY_CARE_PROVIDER_SITE_OTHER): Admitting: Clinical

## 2024-10-14 DIAGNOSIS — F3181 Bipolar II disorder: Secondary | ICD-10-CM | POA: Diagnosis not present

## 2024-10-14 NOTE — Progress Notes (Unsigned)
 THERAPIST PROGRESS NOTE Virtual Visit via Video Note  I connected with Tina [name] on 10/14/2024 at  8:00 AM EST by a video enabled telemedicine application and verified that I am speaking with the correct person using two identifiers.  Location: Patient: home Provider: office   I discussed the limitations of evaluation and management by telemedicine and the availability of in person appointments. The patient expressed understanding and agreed to proceed.   Follow Up Instructions: I discussed the assessment and treatment plan with the patient. The patient was provided an opportunity to ask questions and all were answered. The patient agreed with the plan and demonstrated an understanding of the instructions.   The patient was advised to call back or seek an in-person evaluation if the symptoms worsen or if the condition fails to improve as anticipated.   Session Time: 40 min  Participation Level: Active  Behavioral Response: CasualAlertDepressed  Type of Therapy: Individual Therapy  Treatment Goals addressed: client will engage in at least 80% of scheduled individual pyschotherapy sessions  ProgressTowards Goals: Progressing  Interventions: CBT and Supportive  Summary:  Tina [name] is a 53 y.o. female who presents for the scheduled appointment oriented times five, appropriately dressed and friendly. Client denied hallucinations and delusions. Client reported not much has changed. Client reported she had a good thanksgiving. Client reported her brother that she does talked to expressed the holiday was not the same without her. Client reported the brother she used to live with texted her wishing a happy thanksgiving. Client reported she did not respond. Client reported she fell out with her niece and blocked her from being able to contact her. Client reported she has been disappointment by her nieces actions. Client reported her mother raised her  and her siblings so they would not have to go to the system. Client reported she has blocked plenty of family members because they are mean and selfish. Client reported she otherwise has been doing well with family in [place] . Client reported herself, brother and other family will be leaving for a cruise for the holiday. Client reported she is excited, it is her first big trip. Evidence of progress towards goal:  client reported 1 positive of having something to look forward to.  Suicidal/Homicidal: Nowithout intent/plan  Therapist Response:  Therapist began the appointment asking how she has been doing. Therapist engaged using active listening and positive emotional support. Therapist used cbt to engage and give her time to discuss her thoughts and feelings about family and the future. Therapist used cbt to normalize her emotions within reason. Therapist used cbt to continue teaching the client about use of appropriate boundaries and communication. Therapist used CBT ask the client to identify her progress with frequency of use with coping skills with continued practice in her daily activity.    Therapist assigned the client homework to practice self care.    Plan: Return again in 3 weeks.  Diagnosis: bipolar 2, mdd  Collaboration of Care: Patient refused AEB none requested by the client.  Patient/Guardian was advised Release of Information must be obtained prior to any record release in order to collaborate their care with an outside provider. Patient/Guardian was advised if they have not already done so to contact the registration department to sign all necessary forms in order for us  to release information regarding their care.   Consent: Patient/Guardian gives verbal consent for treatment and assignment of benefits for services provided during this visit. Patient/Guardian expressed understanding and agreed to  proceed.   Analysse Quinonez Y Treana Lacour, LCSW 10/14/2024

## 2024-10-16 ENCOUNTER — Encounter (HOSPITAL_COMMUNITY): Payer: Self-pay | Admitting: Physician Assistant

## 2024-10-16 NOTE — Progress Notes (Signed)
 [facility] MD/PA/NP OP Progress Note  Virtual Visit via Video Note  I connected with Tina [name] on 10/12/24 at  5:00 PM EST by a video enabled telemedicine application and verified that I am speaking with the correct person using two identifiers.  Location: Patient: Home Provider: Clinic   I discussed the limitations of evaluation and management by telemedicine and the availability of in person appointments. The patient expressed understanding and agreed to proceed.  Follow Up Instructions:   I discussed the assessment and treatment plan with the patient. The patient was provided an opportunity to ask questions and all were answered. The patient agreed with the plan and demonstrated an understanding of the instructions.   The patient was advised to call back or seek an in-person evaluation if the symptoms worsen or if the condition fails to improve as anticipated.  I provided 25 minutes of non-face-to-face time during this encounter.  Tina FORBES Bolster, PA   10/12/2024 5:00 PM Tina [name]  MRN:  [id number]  Chief Complaint:  No chief complaint on file.  HPI:   Tina [name] is a 53 year old, African-American female with a past psychiatric history significant for bipolar 2 disorder emphases major depressive episode), generalized anxiety disorder, insomnia, and tobacco use disorder who presents to [facility] via virtual video visit for follow-up and medication management. ***  Caplyta  42 mg daily Mirtazapine  7.5 mg at bedtime Gabapentin  400 mg 3 times daily Buspirone  30 mg 2 times daily Lamotrigine  100 mg daily  ***  Patient is alert and oriented x 4, calm, cooperative, and fully engaged in conversation during the encounter. ***  Visit Diagnosis:    ICD-10-CM   1. Insomnia due to other mental disorder  F51.05 doxylamine , Sleep, (UNISOM  SLEEPTABS) 25 MG tablet   F99     2. Generalized anxiety  disorder  F41.1 busPIRone  (BUSPAR ) 30 MG tablet    3. Bipolar 2 disorder, major depressive episode (HCC)  F31.81 lamoTRIgine  (LAMICTAL ) 25 MG tablet      Past Psychiatric History:  Insomnia Tobacco use Anxiety Depression Biplor 2 disorder  Past Medical History:  Past Medical History:  Diagnosis Date   Diabetes mellitus    type II   Fibroids    HTN (hypertension)    Hyperlipidemia    Iron deficiency anemia    Left acetabular fracture (HCC)    Lumbar strain    Obesity    Ovarian cyst     Past Surgical History:  Procedure Laterality Date   FOOT ARTHRODESIS, MODIFIED MCBRIDE     right foot bunion surgery and ankle surgery 1980s   ORIF ACETABULAR FRACTURE     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    Family Psychiatric History:  Niece - Schizophrenia  Family History:  Family History  Problem Relation Age of Onset   Colon cancer Sister    Diabetes Mother    Diabetes Father    Diabetes Brother    Celiac disease Paternal Aunt     Social History:  Social History   Socioeconomic History   Marital status: Single    Spouse name: Not on file   Number of children: 0   Years of education: 12   Highest education level: Some college, no degree  Occupational History   Occupation: Tefl Teacher: UNEMPLOYED    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  Tobacco Use   Smoking status: Every Day    Current packs/day: 0.50    Average packs/day: 0.5  packs/day for 23.0 years (11.5 ttl pk-yrs)    Types: Cigarettes   Smokeless tobacco: Never   Tobacco comments:    1/2PPD  Vaping Use   Vaping status: Never Used  Substance and Sexual Activity   Alcohol use: Yes    Alcohol/week: 0.0 standard drinks of alcohol    Comment: occ   Drug use: No   Sexual activity: Yes    Partners: Female    Birth control/protection: Injection    Comment: Depo  Other Topics Concern   Not on file  Social History Narrative   Homosexual; in a monogamous relationship w/ homosexual woman.   She is disabled.  She  last worked in 2018 at [employer].    Right handed   One story home   Caffeine: 1 soda daily, occas drinks coffee    Social Drivers of Health   Tobacco Use: High Risk (08/11/2024)   Patient History    Smoking Tobacco Use: Every Day    Smokeless Tobacco Use: Never    Passive Exposure: Not on file  Financial Resource Strain: Low Risk (04/20/2024)   Overall Financial Resource Strain (CARDIA)    Difficulty of Paying Living Expenses: Not very hard  Food Insecurity: Food Insecurity Present (04/20/2024)   Epic    Worried About Programme Researcher, Broadcasting/film/video in the Last Year: Often true    Ran Out of Food in the Last Year: Often true  Transportation Needs: No Transportation Needs (04/20/2024)   Epic    Lack of Transportation (Medical): No    Lack of Transportation (Non-Medical): No  Physical Activity: Sufficiently Active (04/20/2024)   Exercise Vital Sign    Days of Exercise per Week: 6 days    Minutes of Exercise per Session: 30 min  Recent Concern: Physical Activity - Insufficiently Active (02/03/2024)   Exercise Vital Sign    Days of Exercise per Week: 1 day    Minutes of Exercise per Session: 10 min  Stress: Stress Concern Present (04/20/2024)   [employer] of Occupational Health - Occupational Stress Questionnaire    Feeling of Stress: To some extent  Social Connections: Moderately Isolated (04/20/2024)   Social Connection and Isolation Panel    Frequency of Communication with Friends and Family: Three times a week    Frequency of Social Gatherings with Friends and Family: Once a week    Attends Religious Services: 1 to 4 times per year    Active Member of Clubs or Organizations: No    Attends Banker Meetings: Never    Marital Status: Never married  Depression (PHQ2-9): High Risk (10/12/2024)   Depression (PHQ2-9)    PHQ-2 Score: 18  Alcohol Screen: Low Risk (04/20/2024)   Alcohol Screen    Last Alcohol Screening Score (AUDIT): 1  Housing: Low Risk (04/20/2024)   Epic     Unable to Pay for Housing in the Last Year: No    Number of Times Moved in the Last Year: 0    Homeless in the Last Year: No  Utilities: Not At Risk (04/20/2024)   Epic    Threatened with loss of utilities: No  Health Literacy: Adequate Health Literacy (04/20/2024)   B1300 Health Literacy    Frequency of need for help with medical instructions: Never    Allergies:  Allergies  Allergen Reactions   Codeine  Other (See Comments)    hallucinations   Sglt2 Inhibitors     Recurrent yeast infections on Jardiance , stopped 06/2022    Metabolic Disorder Labs:  Lab Results  Component Value Date   HGBA1C 6.3 08/10/2024   MPG 200 05/25/2017   MPG 237 02/26/2016   No results found for: PROLACTIN Lab Results  Component Value Date   CHOL 152 08/10/2024   TRIG 141 08/10/2024   HDL 33 (L) 08/10/2024   CHOLHDL 4.6 (H) 08/10/2024   VLDL 50 (H) 08/21/2014   LDLCALC 94 08/10/2024   LDLCALC 93 08/12/2023   Lab Results  Component Value Date   TSH 1.074 06/27/2011    Therapeutic Level Labs: No results found for: LITHIUM No results found for: VALPROATE No results found for: CBMZ  Current Medications: Current Outpatient Medications  Medication Sig Dispense Refill   doxylamine , Sleep, (UNISOM  SLEEPTABS) 25 MG tablet Take 2 tablets (50 mg total) by mouth at bedtime as needed. 30 tablet 1   lamoTRIgine  (LAMICTAL ) 25 MG tablet Take 1 tablet (25 mg total) by mouth daily for 14 days, THEN 2 tablets (50 mg total) daily for 14 days. 42 tablet 0   amLODipine -olmesartan  (AZOR ) 5-20 MG tablet Take 1 tablet by mouth daily. 90 tablet 3   atorvastatin  (LIPITOR) 40 MG tablet Take 1 tablet (40 mg total) by mouth daily. 100 tablet 2   busPIRone  (BUSPAR ) 30 MG tablet Take 1 tablet (30 mg total) by mouth 2 (two) times daily. 60 tablet 1   celecoxib  (CELEBREX ) 100 MG capsule Take 1 capsule (100 mg total) by mouth 2 (two) times daily. 30 capsule 1   Continuous Glucose Sensor (FREESTYLE LIBRE 3 PLUS  SENSOR) MISC Change sensor every 15 days. 2 each 3   gabapentin  (NEURONTIN ) 400 MG capsule Take 1 capsule (400 mg total) by mouth 3 (three) times daily. 90 capsule 1   Insulin  Lispro Prot & Lispro (HUMALOG  MIX 75/25 KWIKPEN) (75-25) 100 UNIT/ML Kwikpen Inject 30 Units into the skin 2 (two) times daily before a meal. 18 mL 2   lamoTRIgine  (LAMICTAL ) 100 MG tablet Take 1 tablet (100 mg total) by mouth daily. 30 tablet 1   lumateperone  tosylate (CAPLYTA ) 42 MG capsule Take 1 capsule (42 mg total) by mouth daily. 30 capsule 1   metFORMIN  (GLUCOPHAGE ) 1000 MG tablet Take 1 tablet (1,000 mg total) by mouth 2 (two) times daily with a meal. 60 tablet 3   mirtazapine  (REMERON ) 7.5 MG tablet Take 1 tablet (7.5 mg total) by mouth at bedtime. 30 tablet 1   nicotine  polacrilex (NICORETTE ) 4 MG gum Chew 2 pieces (8 mg total) by mouth as needed for smoking cessation. 110 tablet 2   Semaglutide , 2 MG/DOSE, (OZEMPIC , 2 MG/DOSE,) 8 MG/3ML SOPN Inject 2 mg into the skin once a week. 3 mL 3   No current facility-administered medications for this visit.     Musculoskeletal: Strength & Muscle Tone: Unable to assess due to telemedicine visit Gait & Station: Unable to assess due to telemedicine visit Patient leans: Unable to assess due to telemedicine visit  Psychiatric Specialty Exam: Review of Systems  Psychiatric/Behavioral:  Positive for dysphoric mood, hallucinations and sleep disturbance. Negative for decreased concentration, self-injury and suicidal ideas. The patient is nervous/anxious. The patient is not hyperactive.     There were no vitals taken for this visit.There is no height or weight on file to calculate BMI.  General Appearance: Casual  Eye Contact:  Good  Speech:  Clear and Coherent and Normal Rate  Volume:  Normal  Mood:  Anxious and Depressed  Affect:  Congruent  Thought Process:  Coherent, Goal Directed, and Descriptions of Associations:  Intact  Orientation:  Full (Time, Place, and  Person)  Thought Content: Hallucinations: Auditory   Suicidal Thoughts:  No  Homicidal Thoughts:  No  Memory:  Immediate;   Good Recent;   Good Remote;   Good  Judgement:  Good  Insight:  Good  Psychomotor Activity:  Normal  Concentration:  Concentration: Good and Attention Span: Good  Recall:  Good  Fund of Knowledge: Good  Language: Good  Akathisia:  No  Handed:  Right  AIMS (if indicated): done; 4  Assets:  Communication Skills Desire for Improvement Financial Resources/Insurance Housing Social Support  ADL's:  Intact  Cognition: WNL  Sleep:  Poor   Screenings: AIMS    Flowsheet Row Video Visit from 10/12/2024 in [facility] Video Visit from 07/07/2024 in [facility] Video Visit from 05/25/2024 in [facility] Video Visit from 04/06/2024 in [facility] Video Visit from 03/09/2024 in [facility]  AIMS Total Score 10 0 9 4 4    GAD-7    Flowsheet Row Video Visit from 10/12/2024 in [facility] Video Visit from 07/07/2024 in [facility] Video Visit from 05/25/2024 in [facility] Video Visit from 04/06/2024 in [facility] Video Visit from 03/09/2024 in [facility]  Total GAD-7 Score 18 16 19 16 17    [id number]    Flowsheet Row Video Visit from 10/12/2024 in [facility] Video Visit from 07/07/2024 in [facility] Video Visit from 05/25/2024 in [facility] Office Visit from 05/12/2024 in [facility] [place]. [facility] Clinical Support from 04/20/2024 in [facility] [place]. [facility]  PHQ-2 Total Score 5 5 5 2 2   PHQ-9 Total Score 18 16 17 3 3     Flowsheet Row Video Visit from 10/12/2024 in [facility] Video Visit from 07/07/2024 in [facility] Video Visit from 05/25/2024 in [facility]  C-SSRS RISK CATEGORY Moderate Risk Moderate Risk Moderate Risk     Assessment and Plan:   Tina [name] is a 53 year old, African-American female with a past psychiatric history significant for bipolar 2 disorder (major depressive episode), generalized anxiety disorder, insomnia, and tobacco use disorder who presents to [facility] via virtual video visit for follow-up and medication management. ***  ***  A [place] Suicide Severity Rating Scale was performed with the patient being considered moderate risk.  Patient denies suicidal ideations and is able to contract for safety at this time.  Safety planning was discussed with the patient prior to the conclusion of the encounter.  - Patient was instructed to contact 911 in the event of a mental health crisis. - Patient was instructed to contact 988 Suicide and Crisis Lifeline in the event of a mental health crisis. - Patient was instructed to present to [facility] in the event of a mental health crisis.  Collaboration of Care: Collaboration of Care: Medication Management AEB provider managing patient's psychiatric medications, Primary Care Provider AEB patient being seen by primary care provider at [facility], Psychiatrist AEB patient being seen by a mental health provider at this facility, Other provider involved in patient's care AEB  patient being seen by Podiatry, and Referral or follow-up with counselor/therapist AEB patient being seen by a licensed clinical social worker at this facility  Patient/Guardian was advised Release of Information must be obtained prior to any record release in order to collaborate  their care with an outside provider. Patient/Guardian was advised if they have not already done so to contact the registration department to sign all necessary forms in order for us  to release information regarding their care.   Consent: Patient/Guardian gives verbal consent for treatment and assignment of benefits for services provided during this visit. Patient/Guardian expressed understanding and agreed to proceed.   1. Generalized anxiety disorder ***  - busPIRone  (BUSPAR ) 30 MG tablet; Take 1 tablet (30 mg total) by mouth 2 (two) times daily.  Dispense: 60 tablet; Refill: 1  2. Bipolar 2 disorder, major depressive episode (HCC) ***  - lamoTRIgine  (LAMICTAL ) 25 MG tablet; Take 1 tablet (25 mg total) by mouth daily for 14 days, THEN 2 tablets (50 mg total) daily for 14 days.  Dispense: 42 tablet; Refill: 0  3. Insomnia due to other mental disorder (Primary)  - doxylamine , Sleep, (UNISOM  SLEEPTABS) 25 MG tablet; Take 2 tablets (50 mg total) by mouth at bedtime as needed.  Dispense: 30 tablet; Refill: 1  4. Long term current use of antipsychotic medication Pending labs Pending EKG  Patient to follow-up in 6 weeks Provider spent a total of 25 minutes with the patient/reviewing patient's chart  Tina FORBES Bolster, PA 10/12/2024, 5:00 PM

## 2024-10-21 ENCOUNTER — Other Ambulatory Visit: Payer: Self-pay | Admitting: Student

## 2024-10-21 ENCOUNTER — Ambulatory Visit: Payer: Self-pay

## 2024-10-21 DIAGNOSIS — E1165 Type 2 diabetes mellitus with hyperglycemia: Secondary | ICD-10-CM

## 2024-10-21 MED ORDER — INSULIN LISPRO PROT & LISPRO (75-25 MIX) 100 UNIT/ML KWIKPEN: 30.0000 [IU] | PEN_INJECTOR | Freq: Two times a day (BID) | SUBCUTANEOUS | 0 refills | Status: DC

## 2024-10-21 NOTE — Telephone Encounter (Signed)
 FYI Only or Action Required?: Action required by provider: medication refill request.  Patient was last seen in primary care on 08/10/2024 by D'Mello, Rosalyn, DO.  Called Nurse Triage reporting Medication Problem.  Symptoms began today.  Interventions attempted: Nothing.  Symptoms are: stable.  Triage Disposition: Call PCP Now  Patient/caregiver understands and will follow disposition?: No, wishes to speak with PCP                Copied from CRM #[id number]. Topic: Clinical - Prescription Issue >> Oct 21, 2024 12:26 PM DeAngela L wrote: Reason for CRM: the patient forgot her insulin  at home and she is in [place]  about to go on a cruise for 8 days and her doctor office is closed, and she forgot her medication at home on her table this is an emergency she can't imagine being without it for 8 days on a cruise   The patient just got everything refilled before she was due to go on the cruise so now she is concerned that insurance wont be able to get her an emergency refill  Pt num [phone] Primary  Insulin  Lispro Prot & Lispro (HUMALOG  MIX 75/25 KWIKPEN) (75-25) 100 UNIT/ML Kwikpen Semaglutide , 2 MG/DOSE, (OZEMPIC , 2 MG/DOSE,) 8 MG/3ML [facility]  [facility]  Address: [address], [place], [place] [address] Phone: [phone] Pharmacy [phone]  Patietn states the [facility] is a block away from her Reason for Disposition  [1] Caller has URGENT medicine question about med that primary care doctor (or NP/PA) or specialist prescribed AND [2] triager unable to answer question  Answer Assessment - Initial Assessment Questions Called on call # [phone] to return call for request to refill of insulins prior to her cruise for tomorrow. Patient left medications / insulins at home and is currently in [place] . [facility]acy requested [facility].      1. NAME of MEDICINE: What medicine(s) are you calling about?     insulin  Lispro Prot & Lispro  (HUMALOG  MIX 75/25 KWIKPEN) (75-25) 100 UNIT/ML Kwikpen Semaglutide , 2 MG/DOSE, (OZEMPIC , 2 MG/DOSE,) 8 MG/3ML SOPN  2. QUESTION: What is your question? (e.g., double dose of medicine, side effect)     Can medications be sent to a pharmacy in [place]  today? And will insurance still pay due to just getting refilled prior to cruise 3. PRESCRIBER: Who prescribed the medicine? Reason: if prescribed by specialist, call should be referred to that group.     PCP 4. SYMPTOMS: Do you have any symptoms? If Yes, ask: What symptoms are you having?  How bad are the symptoms (e.g., mild, moderate, severe)     No symptoms now but going on 8 day cruise tomorrow. 5. PREGNANCY:  Is there any chance that you are pregnant? When was your last menstrual period?     na  Protocols used: Medication Question Call-A-AH

## 2024-10-21 NOTE — Telephone Encounter (Signed)
" ° ° ° °  Copied from CRM #[id number]. Topic: Clinical - Prescription Issue >> Oct 21, 2024 12:26 PM DeAngela L wrote: Reason for CRM: the patient forgot her insulin  at home and she is in [place]  about to go on a cruise for 8 days and her doctor office is closed, and she forgot her medication at home on her table this is an emergency she can't imagine being without it for 8 days on a cruise   The patient just got everything refilled before she was due to go on the cruise so now she is concerned that insurance wont be able to get her an emergency refill  Pt num [phone] Primary  Insulin  Lispro Prot & Lispro (HUMALOG  MIX 75/25 KWIKPEN) (75-25) 100 UNIT/ML Kwikpen Semaglutide , 2 MG/DOSE, (OZEMPIC , 2 MG/DOSE,) 8 MG/3ML [facility]  [facility]  Address: [address] [place], [place] [address] Phone: [phone] Pharmacy [phone]  Patietn states the [facility] is a block away from her    Answer Assessment - Initial Assessment Questions 1. DRUG NAME: What medicine do you need to have refilled?     Pt called back stating that [facility] does not have rx in stock and voided script. Cruise is for 8 days, will need 2 pens to get her through medication. [facility] voided rx so pt could get at alternative pharmacy d/t pharmacy not having medication in stock. Dr. Damien Lease refilled prior. Will page on call.  Protocols used: Medication Refill and Renewal Call-A-AH  "

## 2024-10-21 NOTE — Progress Notes (Signed)
 Received one page for sending prescription to [facility] in [place]  due to [facility] being out of her insulin . [facility] order.  Sent refill to [facility] in [place]  after she forgot her home insulin .

## 2024-10-21 NOTE — Telephone Encounter (Signed)
 Int Med on-call paged twice with no success by previous RN. Request received today; per RN, patient can get med from [facility] so she updated the pharmacy in chart.  Would you send script to preferred pharmacy? Also requesting more than a 5d supply.

## 2024-10-21 NOTE — Progress Notes (Signed)
 Patient on vacation, forgot her insulin  at home and going on a cruise. Sent insulin  refill to pharmacy in [place]  for her to pick up.

## 2024-10-21 NOTE — Telephone Encounter (Signed)
 On call provider Dr. Damien Lease returned call, advised of the below regarding the insulin . She says she will reach out to the patient and she will send it in a couple of hours. Patient called and advised, she verbalized understanding.

## 2024-10-24 ENCOUNTER — Telehealth: Payer: Self-pay

## 2024-10-24 NOTE — Telephone Encounter (Signed)
 Jasma Deziel (Key: [id number]) Ozempic  (2 MG/DOSE) 8MG /3ML pen-injectors Form [employer] Medicare Part D Electronic Prior Authorization Form (2017 NCPDP) Created 1 day ago Sent to Plan 2 minutes ago Plan Response 2 minutes ago Submit Clinical Questions Determination Message from Plan This medication or product was previously approved on [id number] from 2024-03-01 to 2024-11-02. **Please note: This request was submitted electronically. Formulary lowering, tiering exception, cost reduction and/or pre-benefit determination review (including prospective Medicare hospice reviews) requests cannot be requested using this method of submission. Providers contact us  at [phone] for further assistance.  Pa will have to be resubmitted after 11/02/24.

## 2024-10-24 NOTE — Telephone Encounter (Signed)
 Prior Authorization for patient (Ozempic  (2 MG/DOSE) 8MG /3ML pen-injectors) came through on cover my meds was submitted awaiting approval or denial.  KEY:[id number]

## 2024-11-01 ENCOUNTER — Other Ambulatory Visit: Payer: Self-pay

## 2024-11-01 DIAGNOSIS — Z794 Long term (current) use of insulin: Secondary | ICD-10-CM

## 2024-11-02 ENCOUNTER — Other Ambulatory Visit (HOSPITAL_COMMUNITY): Payer: Self-pay

## 2024-11-02 ENCOUNTER — Other Ambulatory Visit (HOSPITAL_COMMUNITY): Payer: Self-pay | Admitting: Physician Assistant

## 2024-11-02 ENCOUNTER — Other Ambulatory Visit: Payer: Self-pay

## 2024-11-02 DIAGNOSIS — F3181 Bipolar II disorder: Secondary | ICD-10-CM

## 2024-11-02 MED ORDER — OZEMPIC (2 MG/DOSE) 8 MG/3ML ~~LOC~~ SOPN
2.0000 mg | PEN_INJECTOR | SUBCUTANEOUS | 3 refills | Status: AC
  Filled 2024-11-02: qty 3, 28d supply, fill #0
  Filled 2024-12-01: qty 3, 28d supply, fill #1

## 2024-11-04 ENCOUNTER — Other Ambulatory Visit (HOSPITAL_COMMUNITY): Payer: Self-pay | Admitting: Physician Assistant

## 2024-11-04 DIAGNOSIS — F3181 Bipolar II disorder: Secondary | ICD-10-CM

## 2024-11-07 ENCOUNTER — Other Ambulatory Visit: Payer: Self-pay

## 2024-11-07 ENCOUNTER — Ambulatory Visit (INDEPENDENT_AMBULATORY_CARE_PROVIDER_SITE_OTHER): Admitting: Clinical

## 2024-11-07 ENCOUNTER — Other Ambulatory Visit (HOSPITAL_COMMUNITY): Payer: Self-pay

## 2024-11-07 ENCOUNTER — Encounter (HOSPITAL_COMMUNITY): Payer: Self-pay

## 2024-11-07 DIAGNOSIS — F3181 Bipolar II disorder: Secondary | ICD-10-CM

## 2024-11-07 NOTE — Progress Notes (Signed)
" ° °  THERAPIST PROGRESS NOTE Virtual Visit via Video Note  I connected with Tina [name] on 11/07/2024 at  8:00 AM EST by a video enabled telemedicine application and verified that I am speaking with the correct person using two identifiers.  Location: Patient: home- [place] Provider: office   I discussed the limitations of evaluation and management by telemedicine and the availability of in person appointments. The patient expressed understanding and agreed to proceed.   Follow Up Instructions: I discussed the assessment and treatment plan with the patient. The patient was provided an opportunity to ask questions and all were answered. The patient agreed with the plan and demonstrated an understanding of the instructions.   The patient was advised to call back or seek an in-person evaluation if the symptoms worsen or if the condition fails to improve as anticipated.   Session Time: 25 min  Participation Level: Active  Behavioral Response: CasualAlertEuthymic  Type of Therapy: Individual Therapy  Treatment Goals addressed: client will engage in at least 80% of scheduled individual psychotherapy sessions  ProgressTowards Goals: Progressing  Interventions: CBT  Summary:  Tina [name] is a 54 y.o. female who presents for the scheduled appointment oriented times five, appropriately dressed and friendly. Client denied hallucinations and delusions. Client reported she is doing well. Client reported she enjoyed her time with family during the christmas holiday. Client reported it was good seeing her brother. Client reported she was thinking about when she would go to visit her mother grave. Client reported she will wait instead because her nephew wants to being her kids to come visit her during their spring break. Client reported otherwise she has been in a good mood. Client reported no major concerns with depression and anxiety. Evidence of progress towards goal:   client reported 1 positive of elevated mood when spending time with family.  Suicidal/Homicidal: Nowithout intent/plan  Therapist Response:  Therapist began the appointment asking the client how she has been doing. Therapist engaged with active listening and positive emotional support. Therapist used cbt to engage and ask her about any changes or thoughts about recent events. Therapist used cbt to positively reinforce engaging with positive family support to prevent isolation. Therapist used CBT ask the client to identify her progress with frequency of use with coping skills with continued practice in her daily activity.    Therapist assigned the client homework to practice self care.   Plan: Return again in 3 weeks.  Diagnosis: bipolar 2, mdd  Collaboration of Care: Patient refused AEB none  Patient/Guardian was advised Release of Information must be obtained prior to any record release in order to collaborate their care with an outside provider. Patient/Guardian was advised if they have not already done so to contact the registration department to sign all necessary forms in order for us  to release information regarding their care.   Consent: Patient/Guardian gives verbal consent for treatment and assignment of benefits for services provided during this visit. Patient/Guardian expressed understanding and agreed to proceed.   Tina Newstrom Y Millan Legan, LCSW 11/07/2024  "

## 2024-11-08 ENCOUNTER — Other Ambulatory Visit (HOSPITAL_COMMUNITY): Payer: Self-pay

## 2024-11-09 ENCOUNTER — Other Ambulatory Visit (HOSPITAL_COMMUNITY): Payer: Self-pay

## 2024-11-09 ENCOUNTER — Encounter (HOSPITAL_COMMUNITY): Payer: Self-pay

## 2024-11-09 ENCOUNTER — Other Ambulatory Visit (HOSPITAL_COMMUNITY): Payer: Self-pay | Admitting: Physician Assistant

## 2024-11-09 DIAGNOSIS — F3181 Bipolar II disorder: Secondary | ICD-10-CM

## 2024-11-14 ENCOUNTER — Other Ambulatory Visit (HOSPITAL_COMMUNITY): Payer: Self-pay

## 2024-11-14 ENCOUNTER — Other Ambulatory Visit (HOSPITAL_COMMUNITY): Payer: Self-pay | Admitting: Physician Assistant

## 2024-11-14 DIAGNOSIS — F3181 Bipolar II disorder: Secondary | ICD-10-CM

## 2024-11-15 ENCOUNTER — Other Ambulatory Visit: Payer: Self-pay

## 2024-11-15 MED ORDER — INSULIN LISPRO PROT & LISPRO (75-25 MIX) 100 UNIT/ML KWIKPEN: 30.0000 [IU] | PEN_INJECTOR | Freq: Two times a day (BID) | SUBCUTANEOUS | 0 refills | Status: DC

## 2024-11-15 NOTE — Telephone Encounter (Signed)
 Copied from CRM #[id number]. Topic: Clinical - Medication Refill >> Nov 15, 2024  9:31 AM Debby BROCKS wrote: Medication: Insulin  Lispro Prot & Lispro (HUMALOG  75/25 MIX) (75-25) 100 UNIT/ML Kwikpen  Has the patient contacted their pharmacy? Yes (Agent: If no, request that the patient contact the pharmacy for the refill. If patient does not wish to contact the pharmacy document the reason why and proceed with request.) (Agent: If yes, when and what did the pharmacy advise?)  Was advised that she has no more refills remaining   This is the patient's preferred pharmacy:  [facility] [address], [place] - [address] [address] [place] [address] Phone: [phone] Fax: [phone]  Is this the correct pharmacy for this prescription? Yes If no, delete pharmacy and type the correct one.   Has the prescription been filled recently? No  Is the patient out of the medication? Yes  Has the patient been seen for an appointment in the last year OR does the patient have an upcoming appointment? Yes  Can we respond through MyChart? Yes  Agent: Please be advised that Rx refills may take up to 3 business days. We ask that you follow-up with your pharmacy.

## 2024-11-16 ENCOUNTER — Other Ambulatory Visit (HOSPITAL_COMMUNITY): Payer: Self-pay

## 2024-11-23 ENCOUNTER — Other Ambulatory Visit (HOSPITAL_COMMUNITY): Payer: Self-pay

## 2024-11-23 ENCOUNTER — Telehealth (HOSPITAL_COMMUNITY): Admitting: Physician Assistant

## 2024-11-23 ENCOUNTER — Other Ambulatory Visit (HOSPITAL_COMMUNITY): Payer: Self-pay | Admitting: Physician Assistant

## 2024-11-23 ENCOUNTER — Encounter (HOSPITAL_COMMUNITY): Payer: Self-pay | Admitting: Physician Assistant

## 2024-11-23 ENCOUNTER — Other Ambulatory Visit: Payer: Self-pay

## 2024-11-23 DIAGNOSIS — F5105 Insomnia due to other mental disorder: Secondary | ICD-10-CM

## 2024-11-23 DIAGNOSIS — Z79899 Other long term (current) drug therapy: Secondary | ICD-10-CM

## 2024-11-23 DIAGNOSIS — F3181 Bipolar II disorder: Secondary | ICD-10-CM

## 2024-11-23 DIAGNOSIS — F411 Generalized anxiety disorder: Secondary | ICD-10-CM

## 2024-11-23 DIAGNOSIS — F99 Mental disorder, not otherwise specified: Secondary | ICD-10-CM

## 2024-11-23 MED ORDER — LAMOTRIGINE 100 MG PO TABS
100.0000 mg | ORAL_TABLET | Freq: Every day | ORAL | 1 refills | Status: AC
  Filled 2024-11-23 – 2024-12-01 (×2): qty 30, 30d supply, fill #0

## 2024-11-23 MED ORDER — BUSPIRONE HCL 30 MG PO TABS
30.0000 mg | ORAL_TABLET | Freq: Two times a day (BID) | ORAL | 1 refills | Status: AC
  Filled 2024-11-23 – 2024-12-01 (×2): qty 60, 30d supply, fill #0

## 2024-11-23 MED ORDER — RISPERIDONE 1 MG PO TABS
1.0000 mg | ORAL_TABLET | Freq: Every day | ORAL | 0 refills | Status: AC
  Filled 2024-11-23: qty 6, 6d supply, fill #0

## 2024-11-23 MED ORDER — RISPERIDONE 2 MG PO TABS
2.0000 mg | ORAL_TABLET | Freq: Every day | ORAL | 1 refills | Status: AC
  Filled 2024-11-23: qty 30, 30d supply, fill #0

## 2024-11-23 MED ORDER — MIRTAZAPINE 15 MG PO TABS
15.0000 mg | ORAL_TABLET | Freq: Every day | ORAL | 1 refills | Status: AC
  Filled 2024-11-23: qty 30, 30d supply, fill #0

## 2024-11-23 MED ORDER — GABAPENTIN 400 MG PO CAPS
400.0000 mg | ORAL_CAPSULE | Freq: Three times a day (TID) | ORAL | 1 refills | Status: AC
  Filled 2024-11-23 – 2024-12-06 (×3): qty 90, 30d supply, fill #0

## 2024-11-23 MED ORDER — LUMATEPERONE TOSYLATE 10.5 MG PO CAPS
10.5000 mg | ORAL_CAPSULE | Freq: Every day | ORAL | 0 refills | Status: AC
  Filled 2024-11-23: qty 7, 7d supply, fill #0

## 2024-11-23 MED ORDER — CAPLYTA 21 MG PO CAPS
21.0000 mg | ORAL_CAPSULE | Freq: Every day | ORAL | 0 refills | Status: AC
  Filled 2024-11-23: qty 7, 7d supply, fill #0

## 2024-11-23 NOTE — Progress Notes (Signed)
 BH MD/PA/NP OP Progress Note  Virtual Visit via Video Note  I connected with Tina [name] on 11/23/24 at 11:00 AM EST by a video enabled telemedicine application and verified that I am speaking with the correct person using two identifiers.  Location: Patient: Home Provider: Clinic   I discussed the limitations of evaluation and management by telemedicine and the availability of in person appointments. The patient expressed understanding and agreed to proceed.  Follow Up Instructions:   I discussed the assessment and treatment plan with the patient. The patient was provided an opportunity to ask questions and all were answered. The patient agreed with the plan and demonstrated an understanding of the instructions.   The patient was advised to call back or seek an in-person evaluation if the symptoms worsen or if the condition fails to improve as anticipated.  I provided 20 minutes of non-face-to-face time during this encounter.  [name], PA   11/23/2024 11:00 AM Ailie Gage  MRN:  [id number]  Chief Complaint:  Chief Complaint  Patient presents with   Follow-up   Medication Management   HPI:   Tina [name] is a 54 year old, African-American female with a past psychiatric history significant for bipolar 2 disorder emphases major depressive episode), generalized anxiety disorder, insomnia, and tobacco use disorder who presents to [facility] via virtual video visit for follow-up and medication management.  Patient is currently being managed on the following psychiatric medications:  Caplyta  42 mg daily Mirtazapine  7.5 mg at bedtime Gabapentin  400 mg 3 times daily Buspirone  30 mg 2 times daily Lamotrigine  100 mg daily  Patient presents to the encounter stating that she has some good days as well as some not so good days.  She reports that her medications have been okay as of late.  She  continues to endorse depression and rates her depression an 8 out of 10 with 10 being most severe.  Patient endorses depressive episode 2 to 3 days/week.  Patient endorses the following depressive symptoms: feelings of sadness, crying spells, lack of motivation, decreased concentration, decreased energy, irritability, and feelings of guilt/worthlessness.  Patient denies hopelessness.  Patient endorses anxiety and rates her anxiety a 9 out of 10.  Patient endorses stressors related to several different things going on in her life.  Patient reports no other issues or concerns at this time.  A PHQ-9 screen was performed with the patient scoring a 15.  A GAD-7 screen was also performed with the patient scoring a 17.  Patient is alert and oriented x 4, calm, cooperative, and fully engaged in conversation during the encounter.  Patient endorses low mood.  Patient exhibits depressed mood with congruent affect.  Patient denies suicidal or homicidal ideations.  She denies active auditory or visual hallucinations but states that she experienced hearing voices last week.  Patient does not appear to be responding to internal/external stimuli.  Patient endorses poor sleep, and receives on average 2 to 3 hours of sleep per night.  Patient endorses fair appetite and eats on average 1-2 meals per day.  Patient denies alcohol consumption or illicit drug use.  Patient endorses tobacco use and smokes on average a half pack per day.  Visit Diagnosis:    ICD-10-CM   1. Insomnia due to other mental disorder  F51.05    F99     2. Generalized anxiety disorder  F41.1 mirtazapine  (REMERON ) 15 MG tablet    gabapentin  (NEURONTIN ) 400 MG capsule  busPIRone  (BUSPAR ) 30 MG tablet    3. Bipolar 2 disorder, major depressive episode (HCC)  F31.81 lumateperone  tosylate 21 MG CAPS    lumateperone  tosylate (CAPLYTA ) 10.5 MG capsule    risperiDONE  (RISPERDAL ) 1 MG tablet    risperiDONE  (RISPERDAL ) 2 MG tablet    gabapentin  (NEURONTIN )  400 MG capsule    lamoTRIgine  (LAMICTAL ) 100 MG tablet      Past Psychiatric History:  Insomnia Tobacco use Anxiety Depression Biplor 2 disorder  Past Medical History:  Past Medical History:  Diagnosis Date   Diabetes mellitus    type II   Fibroids    HTN (hypertension)    Hyperlipidemia    Iron deficiency anemia    Left acetabular fracture (HCC)    Lumbar strain    Obesity    Ovarian cyst     Past Surgical History:  Procedure Laterality Date   FOOT ARTHRODESIS, MODIFIED MCBRIDE     right foot bunion surgery and ankle surgery 1980s   ORIF ACETABULAR FRACTURE     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    Family Psychiatric History:  Niece - Schizophrenia  Family History:  Family History  Problem Relation Age of Onset   Colon cancer Sister    Diabetes Mother    Diabetes Father    Diabetes Brother    Celiac disease Paternal Aunt     Social History:  Social History   Socioeconomic History   Marital status: Single    Spouse name: Not on file   Number of children: 0   Years of education: 12   Highest education level: Some college, no degree  Occupational History   Occupation: Tefl Teacher: UNEMPLOYED    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  Tobacco Use   Smoking status: Every Day    Current packs/day: 0.50    Average packs/day: 0.5 packs/day for 23.0 years (11.5 ttl pk-yrs)    Types: Cigarettes   Smokeless tobacco: Never   Tobacco comments:    1/2PPD  Vaping Use   Vaping status: Never Used  Substance and Sexual Activity   Alcohol use: Yes    Alcohol/week: 0.0 standard drinks of alcohol    Comment: occ   Drug use: No   Sexual activity: Yes    Partners: Female    Birth control/protection: Injection    Comment: Depo  Other Topics Concern   Not on file  Social History Narrative   Homosexual; in a monogamous relationship w/ homosexual woman.   She is disabled.  She last worked in 2018 at [employer].    Right handed   One story home   Caffeine: 1 soda daily,  occas drinks coffee    Social Drivers of Health   Tobacco Use: High Risk (11/23/2024)   Patient History    Smoking Tobacco Use: Every Day    Smokeless Tobacco Use: Never    Passive Exposure: Not on file  Financial Resource Strain: Low Risk (04/20/2024)   Overall Financial Resource Strain (CARDIA)    Difficulty of Paying Living Expenses: Not very hard  Food Insecurity: Food Insecurity Present (04/20/2024)   Epic    Worried About Programme Researcher, Broadcasting/film/video in the Last Year: Often true    Ran Out of Food in the Last Year: Often true  Transportation Needs: No Transportation Needs (04/20/2024)   Epic    Lack of Transportation (Medical): No    Lack of Transportation (Non-Medical): No  Physical Activity: Sufficiently Active (04/20/2024)   Exercise Vital  Sign    Days of Exercise per Week: 6 days    Minutes of Exercise per Session: 30 min  Recent Concern: Physical Activity - Insufficiently Active (02/03/2024)   Exercise Vital Sign    Days of Exercise per Week: 1 day    Minutes of Exercise per Session: 10 min  Stress: Stress Concern Present (04/20/2024)   [employer] of Occupational Health - Occupational Stress Questionnaire    Feeling of Stress: To some extent  Social Connections: Moderately Isolated (04/20/2024)   Social Connection and Isolation Panel    Frequency of Communication with Friends and Family: Three times a week    Frequency of Social Gatherings with Friends and Family: Once a week    Attends Religious Services: 1 to 4 times per year    Active Member of Clubs or Organizations: No    Attends Banker Meetings: Never    Marital Status: Never married  Depression (PHQ2-9): High Risk (11/23/2024)   Depression (PHQ2-9)    PHQ-2 Score: 15  Alcohol Screen: Low Risk (04/20/2024)   Alcohol Screen    Last Alcohol Screening Score (AUDIT): 1  Housing: Low Risk (04/20/2024)   Epic    Unable to Pay for Housing in the Last Year: No    Number of Times Moved in the Last Year: 0     Homeless in the Last Year: No  Utilities: Not At Risk (04/20/2024)   Epic    Threatened with loss of utilities: No  Health Literacy: Adequate Health Literacy (04/20/2024)   B1300 Health Literacy    Frequency of need for help with medical instructions: Never    Allergies:  Allergies  Allergen Reactions   Codeine  Other (See Comments)    hallucinations   Sglt2 Inhibitors     Recurrent yeast infections on Jardiance , stopped 06/2022    Metabolic Disorder Labs: Lab Results  Component Value Date   HGBA1C 6.3 08/10/2024   MPG 200 05/25/2017   MPG 237 02/26/2016   No results found for: PROLACTIN Lab Results  Component Value Date   CHOL 152 08/10/2024   TRIG 141 08/10/2024   HDL 33 (L) 08/10/2024   CHOLHDL 4.6 (H) 08/10/2024   VLDL 50 (H) 08/21/2014   LDLCALC 94 08/10/2024   LDLCALC 93 08/12/2023   Lab Results  Component Value Date   TSH 1.074 06/27/2011    Therapeutic Level Labs: No results found for: LITHIUM No results found for: VALPROATE No results found for: CBMZ  Current Medications: Current Outpatient Medications  Medication Sig Dispense Refill   lumateperone  tosylate (CAPLYTA ) 10.5 MG capsule Patient to take lumateperone  10.5 mg for 7 days before discontinuing the medication 7 capsule 0   lumateperone  tosylate 21 MG CAPS Patient to take lumateperone  21 mg for 7 days before transitioning to lumateperone  10.5 mg. 7 capsule 0   risperiDONE  (RISPERDAL ) 1 MG tablet Patient to take risperidone  1 mg at bedtime for 6 days, then continue taking 2 mg at bedtime. 6 tablet 0   risperiDONE  (RISPERDAL ) 2 MG tablet Take 1 tablet (2 mg total) by mouth at bedtime. 30 tablet 1   amLODipine -olmesartan  (AZOR ) 5-20 MG tablet Take 1 tablet by mouth daily. 90 tablet 3   atorvastatin  (LIPITOR) 40 MG tablet Take 1 tablet (40 mg total) by mouth daily. 100 tablet 2   busPIRone  (BUSPAR ) 30 MG tablet Take 1 tablet (30 mg total) by mouth 2 (two) times daily. 60 tablet 1   celecoxib   (CELEBREX ) 100 MG capsule Take  1 capsule (100 mg total) by mouth 2 (two) times daily. 30 capsule 1   Continuous Glucose Sensor (FREESTYLE LIBRE 3 PLUS SENSOR) MISC Change sensor every 15 days. 2 each 3   doxylamine , Sleep, (UNISOM  SLEEPTABS) 25 MG tablet Take 2 tablets (50 mg total) by mouth at bedtime as needed. 32 tablet 1   gabapentin  (NEURONTIN ) 400 MG capsule Take 1 capsule (400 mg total) by mouth 3 (three) times daily. 90 capsule 1   Insulin  Lispro Prot & Lispro (HUMALOG  75/25 MIX) (75-25) 100 UNIT/ML Kwikpen Inject 30 Units into the skin 2 (two) times daily. 9 mL 0   lamoTRIgine  (LAMICTAL ) 100 MG tablet Take 1 tablet (100 mg total) by mouth daily. 30 tablet 1   lamoTRIgine  (LAMICTAL ) 25 MG tablet Take 1 tablet (25 mg total) by mouth daily for 14 days, THEN 2 tablets (50 mg total) daily for 14 days. 42 tablet 0   metFORMIN  (GLUCOPHAGE ) 1000 MG tablet Take 1 tablet (1,000 mg total) by mouth 2 (two) times daily with a meal. 60 tablet 3   mirtazapine  (REMERON ) 15 MG tablet Take 1 tablet (15 mg total) by mouth at bedtime. 30 tablet 1   nicotine  polacrilex (NICORETTE ) 4 MG gum Chew 2 pieces (8 mg total) by mouth as needed for smoking cessation. 110 tablet 2   Semaglutide , 2 MG/DOSE, (OZEMPIC , 2 MG/DOSE,) 8 MG/3ML SOPN Inject 2 mg into the skin once a week. 3 mL 3   No current facility-administered medications for this visit.     Musculoskeletal: Strength & Muscle Tone: Unable to assess due to telemedicine visit Gait & Station: Unable to assess due to telemedicine visit Patient leans: Unable to assess due to telemedicine visit  Psychiatric Specialty Exam: Review of Systems  Psychiatric/Behavioral:  Positive for dysphoric mood, hallucinations and sleep disturbance. Negative for decreased concentration, self-injury and suicidal ideas. The patient is nervous/anxious. The patient is not hyperactive.     There were no vitals taken for this visit.There is no height or weight on file to calculate  BMI.  General Appearance: Casual  Eye Contact:  Good  Speech:  Clear and Coherent and Normal Rate  Volume:  Normal  Mood:  Anxious and Depressed  Affect:  Congruent  Thought Process:  Coherent, Goal Directed, and Descriptions of Associations: Intact  Orientation:  Full (Time, Place, and Person)  Thought Content: Hallucinations: Auditory   Suicidal Thoughts:  No  Homicidal Thoughts:  No  Memory:  Immediate;   Good Recent;   Good Remote;   Good  Judgement:  Good  Insight:  Good  Psychomotor Activity:  Normal  Concentration:  Concentration: Good and Attention Span: Good  Recall:  Good  Fund of Knowledge: Good  Language: Good  Akathisia:  No  Handed:  Right  AIMS (if indicated): done; 7  Assets:  Communication Skills Desire for Improvement Financial Resources/Insurance Housing Social Support  ADL's:  Intact  Cognition: WNL  Sleep:  Poor   Screenings: AIMS    Flowsheet Row Video Visit from 11/23/2024 in [facility] Video Visit from 10/12/2024 in [facility] Video Visit from 07/07/2024 in [facility] Video Visit from 05/25/2024 in [facility] Video Visit from 04/06/2024 in [facility]  AIMS Total Score 7 10 0 9 4   GAD-7    Flowsheet Row Video Visit from 11/23/2024 in [facility] Video Visit from 10/12/2024 in [facility] Video Visit from 07/07/2024 in [facility] Video Visit from 05/25/2024 in [facility] Video Visit from 04/06/2024 in [facility]  Total GAD-7 Score 17 18 16 19 16    PHQ2-9    Flowsheet Row Video Visit from 11/23/2024 in [facility] Video Visit from 10/12/2024 in [facility] Video Visit from 07/07/2024 in [facility] Video Visit from 05/25/2024 in [facility] Office Visit from 05/12/2024 in [facility]  PHQ-2 Total Score 6 5 5 5 2   PHQ-9 Total Score 15 18 16 17 3    Flowsheet Row Video Visit from 11/23/2024 in [facility] Video Visit from 10/12/2024 in [facility] Video Visit from 07/07/2024 in [facility]  C-SSRS RISK CATEGORY Moderate Risk Moderate Risk Moderate Risk     Assessment and Plan:   Tina [name] is a 54 year old, African-American female with a past psychiatric history significant for bipolar 2 disorder (major depressive episode), generalized anxiety disorder, insomnia, and tobacco use disorder who presents to [facility] via virtual video visit for follow-up and medication management.  Patient presents to the encounter stating that she has been taking her medications regularly and denies experiencing any adverse side effects.  An aims assessment was performed with the patient scoring a 7.  Patient endorses involuntary movements in her tongue, upper extremities, and lower extremities.  Though patient endorses involuntary movements, no movements were observed during the assessment.  Patient denies medication management for her movements at this time.  Will continue to evaluate for the presence of involuntary movements.  Patient reports that she still continues to experience worsening depression and anxiety.  A PHQ-9 screen was performed with the patient scoring a 15.  A GAD-7 screen was also performed with the patient scoring a 17.  Patient also reports that she continues to experience auditory hallucinations with her last episode occurring last week.  Patient has been on the following psychiatric medications in the past: Lybalvi , Latuda , Vraylar , Seroquel , and Caplyta .   Patient has yet to try risperidone .  Provider recommended discontinuing Caplyta .  Patient was instructed to take Caplyta  21 mg for 7 days, followed by 10.5 mg for 7 more days before discontinuing the medication.  Patient vocalized understanding.  Patient was instructed to take risperidone  1 mg at bedtime for 6 days, followed by 2 mg at bedtime for the management of her mood.  Patient vocalized understanding.  Provider also recommended adjusting patient's mirtazapine  dosage from 7.5 mg to15 mg at bedtime for the management of her mood and sleep.  Patient was agreeable to recommendation.  Patient to continue taking all other medications as prescribed.  Patient's medications to be e-prescribed to pharmacy of choice.  Provider to schedule patient for a future EKG appointment.  A [place] Suicide Severity Rating Scale was performed with the patient being considered moderate risk.  Patient denies suicidal ideations and is able to contract for safety at this time.  Safety planning was discussed with the patient prior to the conclusion of the encounter.  - Patient was instructed to contact 911 in the event of a mental health crisis. - Patient was instructed to contact [facility] Su[facility] Crisis [facility] in the event of a mental health crisis. - Patient was instructed to present  to [facility] in the event of a mental health crisis.  Collaboration of Care: Collaboration of Care: Medication Management AEB provider managing patient's psychiatric medications, Primary Care Provider AEB patient being seen by primary care provider at [facility], Psychiatrist AEB patient being seen by a mental health provider at this facility, Other provider involved in patient's care AEB patient being seen by Podiatry, and Referral or follow-up with counselor/therapist AEB patient being seen by a licensed clinical social worker at this facility  Patient/Guardian was advised Release  of Information must be obtained prior to any record release in order to collaborate their care with an outside provider. Patient/Guardian was advised if they have not already done so to contact the registration department to sign all necessary forms in order for us  to release information regarding their care.   Consent: Patient/Guardian gives verbal consent for treatment and assignment of benefits for services provided during this visit. Patient/Guardian expressed understanding and agreed to proceed.   1. Insomnia due to other mental disorder  2. Generalized anxiety disorder  - mirtazapine  (REMERON ) 15 MG tablet; Take 1 tablet (15 mg total) by mouth at bedtime.  Dispense: 30 tablet; Refill: 1 - gabapentin  (NEURONTIN ) 400 MG capsule; Take 1 capsule (400 mg total) by mouth 3 (three) times daily.  Dispense: 90 capsule; Refill: 1 - busPIRone  (BUSPAR ) 30 MG tablet; Take 1 tablet (30 mg total) by mouth 2 (two) times daily.  Dispense: 60 tablet; Refill: 1  3. Bipolar 2 disorder, major depressive episode (HCC)  - lumateperone  tosylate 21 MG CAPS; Patient to take lumateperone  21 mg for 7 days before transitioning to lumateperone  10.5 mg.  Dispense: 7 capsule; Refill: 0 - lumateperone  tosylate (CAPLYTA ) 10.5 MG capsule; Patient to take lumateperone  10.5 mg for 7 days before discontinuing the medication  Dispense: 7 capsule; Refill: 0 - risperiDONE  (RISPERDAL ) 1 MG tablet; Patient to take risperidone  1 mg at bedtime for 6 days, then continue taking 2 mg at bedtime.  Dispense: 6 tablet; Refill: 0 - risperiDONE  (RISPERDAL ) 2 MG tablet; Take 1 tablet (2 mg total) by mouth at bedtime.  Dispense: 30 tablet; Refill: 1 - gabapentin  (NEURONTIN ) 400 MG capsule; Take 1 capsule (400 mg total) by mouth 3 (three) times daily.  Dispense: 90 capsule; Refill: 1 - lamoTRIgine  (LAMICTAL ) 100 MG tablet; Take 1 tablet (100 mg total) by mouth daily.  Dispense: 30 tablet; Refill: 1  4. Long term current use of antipsychotic  medication (Primary) Pending EKG  Patient to follow-up in 6 weeks Provider spent a total of 20 minutes with the patient/reviewing patient's chart  [name], PA 11/23/2024,11:00 AM

## 2024-11-24 ENCOUNTER — Telehealth (HOSPITAL_COMMUNITY): Admitting: Physician Assistant

## 2024-11-24 ENCOUNTER — Ambulatory Visit (INDEPENDENT_AMBULATORY_CARE_PROVIDER_SITE_OTHER): Admitting: Clinical

## 2024-11-24 DIAGNOSIS — F3181 Bipolar II disorder: Secondary | ICD-10-CM

## 2024-11-26 NOTE — Progress Notes (Unsigned)
" ° °  THERAPIST PROGRESS NOTE Virtual Visit via Video Note  I connected with Tina [name] on 11/24/2024 at  8:00 AM EST by a video enabled telemedicine application and verified that I am speaking with the correct person using two identifiers.  Location: Patient: home Provider: office   I discussed the limitations of evaluation and management by telemedicine and the availability of in person appointments. The patient expressed understanding and agreed to proceed.   Follow Up Instructions: I discussed the assessment and treatment plan with the patient. The patient was provided an opportunity to ask questions and all were answered. The patient agreed with the plan and demonstrated an understanding of the instructions.   The patient was advised to call back or seek an in-person evaluation if the symptoms worsen or if the condition fails to improve as anticipated.    Session Time: 45 min  Participation Level: Active  Behavioral Response: CasualAlertDepressed  Type of Therapy: Individual Therapy  Treatment Goals addressed: client will engage in at least 80% of scheduled individual psychotherapy sessions  ProgressTowards Goals: Progressing  Interventions: CBT  Summary:  Tina [name] is a 54 y.o. female who presents for the scheduled appointment oriented times five, appropriately dressed and friendly. Client denied halluciations and delusions. Client reported she is doing okay. Client reported she has had a lot on her mind. Client reported she has been thinking about her mom. Client reported she has a list of things she is going to have to research such as finding a diabetes/ PCP in [place]  where she wants to live. Client reported so much has happened back home with being kicked out of the home she lived in with her mother and the negative dynamic she had with her brother who assumed ownership of the house. Client reported she just wants distance from him.  Client reported she does keep in contact with her other brother and nephew. Client reported otherwise she is doing okay. Evidence of progress towards goal:  client reported 1 positive of being able to constructively process her thoughts and feelings about triggers related to family.   Suicidal/Homicidal: Nowithout intent/plan  Therapist Response:  Therapist began the appointment asking the client how she has been doing. Therapist engaged with active listening and positive emotional support. Therapist used cbt to engage and give her time to express her thoughts and feelings about grief, family and outlook on the future. Therapist used cbt to normalize her emotions related to the stressor. Therapist used CBT ask the client to identify her progress with frequency of use with coping skills with continued practice in her daily activity.    Therapist assigned the client homework to practice self care.   Plan: Return again in 3 weeks.  Diagnosis: bipolar 2 disorder, mdd  Collaboration of Care: Patient refused AEB none requested by the client.  Patient/Guardian was advised Release of Information must be obtained prior to any record release in order to collaborate their care with an outside provider. Patient/Guardian was advised if they have not already done so to contact the registration department to sign all necessary forms in order for us  to release information regarding their care.   Consent: Patient/Guardian gives verbal consent for treatment and assignment of benefits for services provided during this visit. Patient/Guardian expressed understanding and agreed to proceed.   Alicia Seib Y Deyonte Cadden, LCSW 11/24/2024  "

## 2024-11-29 ENCOUNTER — Other Ambulatory Visit (HOSPITAL_COMMUNITY): Payer: Self-pay

## 2024-11-29 ENCOUNTER — Other Ambulatory Visit (HOSPITAL_COMMUNITY): Payer: Self-pay | Admitting: Physician Assistant

## 2024-11-29 DIAGNOSIS — F3181 Bipolar II disorder: Secondary | ICD-10-CM

## 2024-11-30 ENCOUNTER — Other Ambulatory Visit (HOSPITAL_COMMUNITY): Payer: Self-pay

## 2024-12-01 ENCOUNTER — Other Ambulatory Visit: Payer: Self-pay

## 2024-12-01 ENCOUNTER — Other Ambulatory Visit (HOSPITAL_COMMUNITY): Payer: Self-pay

## 2024-12-02 ENCOUNTER — Other Ambulatory Visit (HOSPITAL_COMMUNITY): Payer: Self-pay | Admitting: Physician Assistant

## 2024-12-02 DIAGNOSIS — F3181 Bipolar II disorder: Secondary | ICD-10-CM

## 2024-12-05 ENCOUNTER — Other Ambulatory Visit: Payer: Self-pay

## 2024-12-05 ENCOUNTER — Other Ambulatory Visit (HOSPITAL_COMMUNITY): Payer: Self-pay

## 2024-12-05 DIAGNOSIS — E1165 Type 2 diabetes mellitus with hyperglycemia: Secondary | ICD-10-CM

## 2024-12-06 ENCOUNTER — Telehealth: Payer: Self-pay | Admitting: *Deleted

## 2024-12-06 ENCOUNTER — Other Ambulatory Visit (HOSPITAL_COMMUNITY): Payer: Self-pay

## 2024-12-06 ENCOUNTER — Other Ambulatory Visit: Payer: Self-pay

## 2024-12-06 ENCOUNTER — Encounter (HOSPITAL_COMMUNITY): Payer: Self-pay

## 2024-12-06 DIAGNOSIS — I10 Essential (primary) hypertension: Secondary | ICD-10-CM

## 2024-12-06 MED ORDER — INSULIN LISPRO PROT & LISPRO (75-25 MIX) 100 UNIT/ML KWIKPEN
30.0000 [IU] | PEN_INJECTOR | Freq: Two times a day (BID) | SUBCUTANEOUS | 0 refills | Status: AC
  Filled 2024-12-06: qty 9, 15d supply, fill #0

## 2024-12-06 MED ORDER — AMLODIPINE-OLMESARTAN 5-20 MG PO TABS
1.0000 | ORAL_TABLET | Freq: Every day | ORAL | 3 refills | Status: AC
  Filled 2024-12-06: qty 90, 90d supply, fill #0

## 2024-12-06 NOTE — Telephone Encounter (Signed)
 Medication discontinued 10/21/24

## 2024-12-08 ENCOUNTER — Other Ambulatory Visit (HOSPITAL_COMMUNITY): Payer: Self-pay

## 2024-12-13 ENCOUNTER — Ambulatory Visit (HOSPITAL_COMMUNITY): Admitting: Clinical

## 2025-01-04 ENCOUNTER — Telehealth (HOSPITAL_COMMUNITY): Admitting: Physician Assistant

## 2025-04-26 ENCOUNTER — Ambulatory Visit
